# Patient Record
Sex: Female | Born: 1935 | Hispanic: No | Smoking: Never smoker
Health system: Southern US, Community
[De-identification: ages and names within clinical notes are randomized; demographics above are authoritative.]

## PROBLEM LIST (undated history)

## (undated) DIAGNOSIS — I10 Essential (primary) hypertension: Secondary | ICD-10-CM

## (undated) DIAGNOSIS — Z9989 Dependence on other enabling machines and devices: Secondary | ICD-10-CM

## (undated) DIAGNOSIS — G4733 Obstructive sleep apnea (adult) (pediatric): Secondary | ICD-10-CM

## (undated) DIAGNOSIS — Z0000 Encounter for general adult medical examination without abnormal findings: Secondary | ICD-10-CM

## (undated) DIAGNOSIS — I639 Cerebral infarction, unspecified: Secondary | ICD-10-CM

## (undated) DIAGNOSIS — M545 Low back pain, unspecified: Secondary | ICD-10-CM

## (undated) DIAGNOSIS — R0602 Shortness of breath: Secondary | ICD-10-CM

## (undated) DIAGNOSIS — F0390 Unspecified dementia without behavioral disturbance: Secondary | ICD-10-CM

## (undated) DIAGNOSIS — Z862 Personal history of diseases of the blood and blood-forming organs and certain disorders involving the immune mechanism: Secondary | ICD-10-CM

## (undated) DIAGNOSIS — K529 Noninfective gastroenteritis and colitis, unspecified: Secondary | ICD-10-CM

## (undated) DIAGNOSIS — M170 Bilateral primary osteoarthritis of knee: Secondary | ICD-10-CM

## (undated) DIAGNOSIS — R7303 Prediabetes: Secondary | ICD-10-CM

## (undated) DIAGNOSIS — K219 Gastro-esophageal reflux disease without esophagitis: Secondary | ICD-10-CM

## (undated) DIAGNOSIS — E785 Hyperlipidemia, unspecified: Secondary | ICD-10-CM

## (undated) DIAGNOSIS — E663 Overweight: Secondary | ICD-10-CM

## (undated) HISTORY — DX: Bilateral primary osteoarthritis of knee: M17.0

## (undated) HISTORY — DX: Shortness of breath: R06.02

## (undated) HISTORY — DX: Gastro-esophageal reflux disease without esophagitis: K21.9

## (undated) HISTORY — DX: Personal history of diseases of the blood and blood-forming organs and certain disorders involving the immune mechanism: Z86.2

## (undated) HISTORY — PX: LUMBAR DISC SURGERY: SHX700

## (undated) HISTORY — DX: Obstructive sleep apnea (adult) (pediatric): G47.33

## (undated) HISTORY — DX: Prediabetes: R73.03

## (undated) HISTORY — PX: CATARACT EXTRACTION: SUR2

## (undated) HISTORY — DX: Overweight: E66.3

## (undated) HISTORY — DX: Low back pain, unspecified: M54.50

## (undated) HISTORY — PX: CHOLECYSTECTOMY: SHX55

## (undated) HISTORY — DX: Dependence on other enabling machines and devices: Z99.89

## (undated) HISTORY — DX: Low back pain: M54.5

## (undated) HISTORY — DX: Noninfective gastroenteritis and colitis, unspecified: K52.9

## (undated) HISTORY — DX: Essential (primary) hypertension: I10

## (undated) HISTORY — DX: Cerebral infarction, unspecified: I63.9

## (undated) HISTORY — PX: ABDOMINAL HYSTERECTOMY: SHX81

## (undated) HISTORY — DX: Encounter for general adult medical examination without abnormal findings: Z00.00

## (undated) HISTORY — DX: Hyperlipidemia, unspecified: E78.5

---

## 2004-10-17 ENCOUNTER — Emergency Department: Payer: Self-pay | Admitting: Internal Medicine

## 2006-05-23 DIAGNOSIS — M1990 Unspecified osteoarthritis, unspecified site: Secondary | ICD-10-CM | POA: Insufficient documentation

## 2007-12-26 ENCOUNTER — Ambulatory Visit: Payer: Self-pay | Admitting: Ophthalmology

## 2008-01-21 ENCOUNTER — Ambulatory Visit: Payer: Self-pay | Admitting: Ophthalmology

## 2009-04-07 ENCOUNTER — Ambulatory Visit: Payer: Self-pay | Admitting: Ophthalmology

## 2009-04-07 ENCOUNTER — Ambulatory Visit: Payer: Self-pay | Admitting: Cardiovascular Disease

## 2009-04-20 ENCOUNTER — Ambulatory Visit: Payer: Self-pay | Admitting: Ophthalmology

## 2010-07-14 ENCOUNTER — Ambulatory Visit: Payer: Self-pay | Admitting: Internal Medicine

## 2011-04-13 ENCOUNTER — Emergency Department: Payer: Self-pay | Admitting: Unknown Physician Specialty

## 2011-04-13 LAB — COMPREHENSIVE METABOLIC PANEL
Albumin: 4 g/dL (ref 3.4–5.0)
Alkaline Phosphatase: 51 U/L (ref 50–136)
BUN: 14 mg/dL (ref 7–18)
Calcium, Total: 9.2 mg/dL (ref 8.5–10.1)
Co2: 29 mmol/L (ref 21–32)
EGFR (Non-African Amer.): >60
Glucose: 111 mg/dL — ABNORMAL HIGH (ref 65–99)
Osmolality: 282 (ref 275–301)
Potassium: 3.4 mmol/L — ABNORMAL LOW (ref 3.5–5.1)
SGOT(AST): 24 U/L (ref 15–37)
SGPT (ALT): 20 U/L
Sodium: 141 mmol/L (ref 136–145)
Total Protein: 7.8 g/dL (ref 6.4–8.2)

## 2011-04-13 LAB — CBC
MCHC: 32.3 g/dL (ref 32.0–36.0)
Platelet: 165 10*3/uL (ref 150–440)
WBC: 5.1 10*3/uL (ref 3.6–11.0)

## 2013-07-10 DIAGNOSIS — I639 Cerebral infarction, unspecified: Secondary | ICD-10-CM | POA: Insufficient documentation

## 2013-07-10 DIAGNOSIS — I2089 Other forms of angina pectoris: Secondary | ICD-10-CM | POA: Insufficient documentation

## 2013-07-10 DIAGNOSIS — E663 Overweight: Secondary | ICD-10-CM

## 2013-07-10 DIAGNOSIS — E669 Obesity, unspecified: Secondary | ICD-10-CM | POA: Insufficient documentation

## 2013-07-10 DIAGNOSIS — I209 Angina pectoris, unspecified: Secondary | ICD-10-CM | POA: Insufficient documentation

## 2013-07-10 DIAGNOSIS — I10 Essential (primary) hypertension: Secondary | ICD-10-CM | POA: Insufficient documentation

## 2013-07-10 DIAGNOSIS — R0609 Other forms of dyspnea: Secondary | ICD-10-CM | POA: Insufficient documentation

## 2013-07-10 DIAGNOSIS — I208 Other forms of angina pectoris: Secondary | ICD-10-CM | POA: Insufficient documentation

## 2013-07-10 DIAGNOSIS — E66811 Obesity, class 1: Secondary | ICD-10-CM | POA: Insufficient documentation

## 2013-07-10 DIAGNOSIS — R0602 Shortness of breath: Secondary | ICD-10-CM

## 2013-07-10 DIAGNOSIS — R0600 Dyspnea, unspecified: Secondary | ICD-10-CM | POA: Insufficient documentation

## 2013-07-10 DIAGNOSIS — E785 Hyperlipidemia, unspecified: Secondary | ICD-10-CM | POA: Insufficient documentation

## 2013-07-10 HISTORY — DX: Overweight: E66.3

## 2013-07-10 HISTORY — DX: Shortness of breath: R06.02

## 2014-04-25 DIAGNOSIS — M1990 Unspecified osteoarthritis, unspecified site: Secondary | ICD-10-CM | POA: Diagnosis not present

## 2014-07-16 ENCOUNTER — Other Ambulatory Visit: Payer: Self-pay | Admitting: Family Medicine

## 2014-07-24 ENCOUNTER — Telehealth: Payer: Self-pay | Admitting: Family Medicine

## 2014-07-24 NOTE — Telephone Encounter (Signed)
PT NEEDS HER PAIN MEDICATION REFILLED AND SHE DOES NOT REMEMBER THE NAME. SHE DOES HAVE AN APPT IN July FOR FOLLOW UP.

## 2014-07-28 ENCOUNTER — Other Ambulatory Visit: Payer: Self-pay

## 2014-07-28 ENCOUNTER — Ambulatory Visit (INDEPENDENT_AMBULATORY_CARE_PROVIDER_SITE_OTHER): Payer: Medicare Other | Admitting: Family Medicine

## 2014-07-28 ENCOUNTER — Encounter: Payer: Self-pay | Admitting: Family Medicine

## 2014-07-28 VITALS — BP 128/68 | HR 88 | Temp 98.0°F | Resp 16 | Wt 187.2 lb

## 2014-07-28 DIAGNOSIS — G8929 Other chronic pain: Secondary | ICD-10-CM | POA: Diagnosis not present

## 2014-07-28 DIAGNOSIS — R600 Localized edema: Secondary | ICD-10-CM | POA: Insufficient documentation

## 2014-07-28 DIAGNOSIS — R9431 Abnormal electrocardiogram [ECG] [EKG]: Secondary | ICD-10-CM | POA: Insufficient documentation

## 2014-07-28 DIAGNOSIS — M5136 Other intervertebral disc degeneration, lumbar region: Secondary | ICD-10-CM | POA: Insufficient documentation

## 2014-07-28 DIAGNOSIS — I10 Essential (primary) hypertension: Secondary | ICD-10-CM | POA: Insufficient documentation

## 2014-07-28 DIAGNOSIS — G510 Bell's palsy: Secondary | ICD-10-CM | POA: Insufficient documentation

## 2014-07-28 DIAGNOSIS — R0989 Other specified symptoms and signs involving the circulatory and respiratory systems: Secondary | ICD-10-CM | POA: Insufficient documentation

## 2014-07-28 HISTORY — DX: Essential (primary) hypertension: I10

## 2014-07-28 MED ORDER — HYDROCODONE-ACETAMINOPHEN 5-325 MG PO TABS: 1.0000 | ORAL_TABLET | Freq: Three times a day (TID) | ORAL | Status: DC | PRN

## 2014-07-28 MED ORDER — OMEPRAZOLE 20 MG PO CPDR: 20.0000 mg | DELAYED_RELEASE_CAPSULE | Freq: Every day | ORAL | Status: DC

## 2014-07-28 MED ORDER — PRAVASTATIN SODIUM 40 MG PO TABS: 40.0000 mg | ORAL_TABLET | Freq: Every day | ORAL | Status: DC

## 2014-07-28 NOTE — Progress Notes (Signed)
Name: Jocelyn [name]   MRN: [id number]    DOB: [birth year]   Date:07/28/2014       Progress Note  Subjective  Chief Complaint  Chief Complaint  Patient presents with  . Pain    HPI  Chronic pain  Subjective patient has chronic arthritic pain in both knees. She is currently taking sodium naproxen 500 mg twice a day and is also on Vicodin 325 one 3 times a day for pain control this is making her pain tolerable.  Past Medical History  Diagnosis Date  . Hyperlipidemia   . Hypertension   . GERD (gastroesophageal reflux disease)   . Lumbar pain     History  Substance Use Topics  . Smoking status: Never Smoker   . Smokeless tobacco: Not on file  . Alcohol Use: No     Current outpatient prescriptions:  .  amLODipine (NORVASC) 10 MG tablet, Take 10 mg by mouth daily., Disp: , Rfl:  .  cloNIDine (CATAPRES) 0.1 MG tablet, Take 0.1 mg by mouth 2 (two) times daily., Disp: , Rfl:  .  clotrimazole-betamethasone (LOTRISONE) cream, Apply 1 application topically 2 (two) times daily., Disp: , Rfl:  .  hydrochlorothiazide (HYDRODIURIL) 25 MG tablet, Take 25 mg by mouth daily., Disp: , Rfl:  .  HYDROcodone-acetaminophen (NORCO/VICODIN) 5-325 MG per tablet, Take 1 tablet by mouth 3 (three) times daily as needed., Disp: , Rfl: 0 .  isosorbide mononitrate (IMDUR) 120 MG 24 hr tablet, TAKE 1 TABLET BY MOUTH DAILY, Disp: 90 tablet, Rfl: 1 .  naproxen (NAPROSYN) 500 MG tablet, Take 500 mg by mouth 2 (two) times daily with a meal., Disp: , Rfl:  .  olmesartan (BENICAR) 40 MG tablet, Take 40 mg by mouth daily., Disp: , Rfl:  .  omeprazole (PRILOSEC) 20 MG capsule, Take 20 mg by mouth daily., Disp: , Rfl:  .  pravastatin (PRAVACHOL) 40 MG tablet, Take 40 mg by mouth daily., Disp: , Rfl:  .  TRIBENZOR 40-10-25 MG TABS, Take 1 tablet by mouth daily., Disp: , Rfl: 1  No Known Allergies  Review of Systems  Constitutional: Negative.   Musculoskeletal: Positive for joint pain.      Objective  Filed Vitals:   07/28/14 1108  BP: 128/68  Pulse: 88  Temp: 98 F (36.7 C)  Resp: 16  Weight: 187 lb 4 oz (84.936 kg)  SpO2: 93%     Physical Exam  Musculoskeletal: She exhibits tenderness.  Bilateral arthritic changes in the knees with hypertrophy. There is also some gait limitation secondary to the pain.      Assessment & Plan 1. Chronic pain Stable - HYDROcodone-acetaminophen (NORCO/VICODIN) 5-325 MG per tablet; Take 1 tablet by mouth 3 (three) times daily as needed.  Dispense: 90 tablet; Refill: 0

## 2014-07-28 NOTE — Patient Instructions (Addendum)
F/U in 2 moObesity Obesity is defined as having too much total body fat and a body mass index (BMI) of 30 or more. BMI is an estimate of body fat and is calculated from your height and weight. Obesity happens when you consume more calories than you can burn by exercising or performing daily physical tasks. Prolonged obesity can cause major illnesses or emergencies, such as:   Stroke.  Heart disease.  Diabetes.  Cancer.  Arthritis.  High blood pressure (hypertension).  High cholesterol.  Sleep apnea.  Erectile dysfunction.  Infertility problems. CAUSES   Regularly eating unhealthy foods.  Physical inactivity.  Certain disorders, such as an underactive thyroid (hypothyroidism), Cushing's syndrome, and polycystic ovarian syndrome.  Certain medicines, such as steroids, some depression medicines, and antipsychotics.  Genetics.  Lack of sleep. DIAGNOSIS  A health care provider can diagnose obesity after calculating your BMI. Obesity will be diagnosed if your BMI is 30 or higher.  There are other methods of measuring obesity levels. Some other methods include measuring your skinfold thickness, your waist circumference, and comparing your hip circumference to your waist circumference. TREATMENT  A healthy treatment program includes some or all of the following:  Long-term dietary changes.  Exercise and physical activity.  Behavioral and lifestyle changes.  Medicine only under the supervision of your health care provider. Medicines may help, but only if they are used with diet and exercise programs. An unhealthy treatment program includes:  Fasting.  Fad diets.  Supplements and drugs. These choices do not succeed in long-term weight control.  HOME CARE INSTRUCTIONS   Exercise and perform physical activity as directed by your health care provider. To increase physical activity, try the following:  Use stairs instead of elevators.  Park farther away from store  entrances.  Garden, bike, or walk instead of watching television or using the computer.  Eat healthy, low-calorie foods and drinks on a regular basis. Eat more fruits and vegetables. Use low-calorie cookbooks or take healthy cooking classes.  Limit fast food, sweets, and processed snack foods.  Eat smaller portions.  Keep a daily journal of everything you eat. There are many free websites to help you with this. It may be helpful to measure your foods so you can determine if you are eating the correct portion sizes.  Avoid drinking alcohol. Drink more water and drinks without calories.  Take vitamins and supplements only as recommended by your health care provider.  Weight-loss support groups, Tax adviser, counselors, and stress reduction education can also be very helpful. SEEK IMMEDIATE MEDICAL CARE IF:  You have chest pain or tightness.  You have trouble breathing or feel short of breath.  You have weakness or leg numbness.  You feel confused or have trouble talking.  You have sudden changes in your vision. MAKE SURE YOU:  Understand these instructions.  Will watch your condition.  Will get help right away if you are not doing well or get worse. Document Released: 02/25/2004 Document Revised: 06/03/2013 Document Reviewed: 02/23/2011 [facility] Patient Information 2015 [place], [place]. This information is not intended to replace advice given to you by your health care provider. Make sure you discuss any questions you have with your health care provider.

## 2014-08-11 ENCOUNTER — Encounter: Payer: Self-pay | Admitting: Family Medicine

## 2014-08-11 ENCOUNTER — Ambulatory Visit (INDEPENDENT_AMBULATORY_CARE_PROVIDER_SITE_OTHER): Payer: Medicare Other | Admitting: Family Medicine

## 2014-08-11 VITALS — BP 128/64 | HR 90 | Temp 98.2°F | Resp 16 | Ht 65.0 in | Wt 187.2 lb

## 2014-08-11 DIAGNOSIS — R609 Edema, unspecified: Secondary | ICD-10-CM

## 2014-08-11 DIAGNOSIS — R0989 Other specified symptoms and signs involving the circulatory and respiratory systems: Secondary | ICD-10-CM | POA: Diagnosis not present

## 2014-08-11 DIAGNOSIS — K219 Gastro-esophageal reflux disease without esophagitis: Secondary | ICD-10-CM | POA: Diagnosis not present

## 2014-08-11 DIAGNOSIS — R739 Hyperglycemia, unspecified: Secondary | ICD-10-CM

## 2014-08-11 DIAGNOSIS — E785 Hyperlipidemia, unspecified: Secondary | ICD-10-CM

## 2014-08-11 DIAGNOSIS — M5136 Other intervertebral disc degeneration, lumbar region: Secondary | ICD-10-CM | POA: Diagnosis not present

## 2014-08-11 DIAGNOSIS — I639 Cerebral infarction, unspecified: Secondary | ICD-10-CM | POA: Diagnosis not present

## 2014-08-11 DIAGNOSIS — M51369 Other intervertebral disc degeneration, lumbar region without mention of lumbar back pain or lower extremity pain: Secondary | ICD-10-CM

## 2014-08-11 DIAGNOSIS — R600 Localized edema: Secondary | ICD-10-CM

## 2014-08-11 DIAGNOSIS — M153 Secondary multiple arthritis: Secondary | ICD-10-CM | POA: Diagnosis not present

## 2014-08-11 DIAGNOSIS — H9193 Unspecified hearing loss, bilateral: Secondary | ICD-10-CM | POA: Diagnosis not present

## 2014-08-11 DIAGNOSIS — I110 Hypertensive heart disease with heart failure: Secondary | ICD-10-CM

## 2014-08-11 DIAGNOSIS — E669 Obesity, unspecified: Secondary | ICD-10-CM

## 2014-08-11 LAB — POCT GLYCOSYLATED HEMOGLOBIN (HGB A1C): Hemoglobin A1C: 6.3

## 2014-08-11 LAB — GLUCOSE, POCT (MANUAL RESULT ENTRY): POC GLUCOSE: 108 mg/dL — AB (ref 70–99)

## 2014-08-11 MED ORDER — OMEPRAZOLE 20 MG PO CPDR: 20.0000 mg | DELAYED_RELEASE_CAPSULE | Freq: Every day | ORAL | Status: DC

## 2014-08-11 MED ORDER — DICLOFENAC SODIUM 1 % TD GEL: 2.0000 g | Freq: Four times a day (QID) | TRANSDERMAL | Status: DC

## 2014-08-11 NOTE — Progress Notes (Signed)
Name: Jocelyn [name]   MRN: [id number]    DOB: 07/26/1931   Date:08/11/2014       Progress Note  Subjective  Chief Complaint  Chief Complaint  Patient presents with  . Hypertension  . Hyperlipidemia  . Gastrophageal Reflux    Hypertension This is a chronic problem. The current episode started more than 1 year ago. The problem is unchanged. The problem is controlled. Associated symptoms include anxiety and peripheral edema. Pertinent negatives include no blurred vision, chest pain, headaches, neck pain, orthopnea, palpitations or shortness of breath. There are no associated agents to hypertension. Risk factors for coronary artery disease include dyslipidemia, obesity, post-menopausal state and sedentary lifestyle. Past treatments include calcium channel blockers, central alpha agonists, diuretics and angiotensin blockers. Hypertensive end-organ damage includes CAD/MI.  Hyperlipidemia This is a chronic problem. The current episode started more than 1 year ago. The problem is controlled. Recent lipid tests were reviewed and are normal. Exacerbating diseases include obesity. Factors aggravating her hyperlipidemia include thiazides and fatty foods. Pertinent negatives include no chest pain, focal weakness, myalgias or shortness of breath. Current antihyperlipidemic treatment includes statins. The current treatment provides moderate improvement of lipids. There are no compliance problems.   Gastrophageal Reflux She complains of belching and heartburn. She reports no chest pain, no coughing, no nausea or no sore throat. This is a chronic problem. The current episode started more than 1 year ago. The problem occurs constantly. The problem has been unchanged. The symptoms are aggravated by certain foods. Pertinent negatives include no weight loss. Risk factors include caffeine use and obesity. She has tried a PPI for the symptoms. The treatment provided mild relief.    Patient has a history of elevated  fasting glucose of 105 on 7:15.i  Past Medical History  Diagnosis Date  . Hyperlipidemia   . Hypertension   . GERD (gastroesophageal reflux disease)   . Lumbar pain     History  Substance Use Topics  . Smoking status: Never Smoker   . Smokeless tobacco: Not on file  . Alcohol Use: No     Current outpatient prescriptions:  .  amLODipine (NORVASC) 10 MG tablet, Take 10 mg by mouth daily., Disp: , Rfl:  .  cloNIDine (CATAPRES) 0.1 MG tablet, Take 0.1 mg by mouth 2 (two) times daily., Disp: , Rfl:  .  clotrimazole-betamethasone (LOTRISONE) cream, Apply 1 application topically 2 (two) times daily., Disp: , Rfl:  .  hydrochlorothiazide (HYDRODIURIL) 25 MG tablet, Take 25 mg by mouth daily., Disp: , Rfl:  .  HYDROcodone-acetaminophen (NORCO/VICODIN) 5-325 MG per tablet, Take 1 tablet by mouth 3 (three) times daily as needed., Disp: 90 tablet, Rfl: 0 .  isosorbide mononitrate (IMDUR) 120 MG 24 hr tablet, TAKE 1 TABLET BY MOUTH DAILY, Disp: 90 tablet, Rfl: 1 .  naproxen (NAPROSYN) 500 MG tablet, Take 500 mg by mouth 2 (two) times daily with a meal., Disp: , Rfl:  .  olmesartan (BENICAR) 40 MG tablet, Take 40 mg by mouth daily., Disp: , Rfl:  .  omeprazole (PRILOSEC) 20 MG capsule, Take 1 capsule (20 mg total) by mouth daily., Disp: 90 capsule, Rfl: 3 .  pravastatin (PRAVACHOL) 40 MG tablet, Take 1 tablet (40 mg total) by mouth daily., Disp: 30 tablet, Rfl: 3 .  TRIBENZOR 40-10-25 MG TABS, Take 1 tablet by mouth daily., Disp: , Rfl: 1  No Known Allergies  Review of Systems  Constitutional: Negative for fever, chills and weight loss.  HENT: Negative for congestion,  hearing loss, sore throat and tinnitus.   Eyes: Negative for blurred vision, double vision and redness.  Respiratory: Negative for cough, hemoptysis and shortness of breath.   Cardiovascular: Positive for leg swelling. Negative for chest pain, palpitations, orthopnea and claudication.  Gastrointestinal: Positive for heartburn.  Negative for nausea, vomiting, diarrhea, constipation and blood in stool.  Genitourinary: Negative for dysuria, urgency, frequency and hematuria.  Musculoskeletal: Positive for back pain and joint pain. Negative for myalgias, falls and neck pain.  Skin: Negative for itching.  Neurological: Negative for dizziness, tingling, tremors, focal weakness, seizures, loss of consciousness, weakness and headaches.  Endo/Heme/Allergies: Does not bruise/bleed easily.  Psychiatric/Behavioral: Negative for depression and substance abuse. The patient is not nervous/anxious and does not have insomnia.      Objective  Filed Vitals:   08/11/14 0849  BP: 128/64  Pulse: 90  Temp: 98.2 F (36.8 C)  TempSrc: Oral  Resp: 16  Height: 5\' 5"  (1.651 m)  Weight: 187 lb 3.2 oz (84.913 kg)  SpO2: 94%     Physical Exam  Constitutional: She is oriented to person, place, and time and well-developed, well-nourished, and in no distress.  Morbid obesity  HENT:  Head: Normocephalic.  Eyes: EOM are normal. Pupils are equal, round, and reactive to light.  Neck: Normal range of motion. No thyromegaly present.  Cardiovascular: Normal rate, regular rhythm and normal heart sounds.   No murmur heard. Pulmonary/Chest: Effort normal and breath sounds normal.  Abdominal: Soft. Bowel sounds are normal.  Musculoskeletal: Normal range of motion. She exhibits edema and tenderness.  Neurological: She is alert and oriented to person, place, and time. No cranial nerve deficit. Gait normal.  Skin: Skin is warm and dry. No rash noted.  Psychiatric: Memory and affect normal.      Assessment & Plan  1. Gastroesophageal reflux disease without esophagitis Stable on omeprazole - omeprazole (PRILOSEC) 20 MG capsule; Take 1 capsule (20 mg total) by mouth daily.  Dispense: 90 capsule; Refill: 3  2. Cerebral vascular accident Remote  3. Benign hypertensive heart disease, with heart failure Well-controlled  4. Hearing loss,  bilateral Will need hearing aids at some point  5. DDD (degenerative disc disease), lumbar Continue naproxen   6. Edema extremities Continue diuretic  7. HLD (hyperlipidemia) Labs today - Comprehensive metabolic panel - Lipid panel - TSH    8. Adiposity Educational sheet on obesity  9. Secondary osteoarthritis of multiple sites At topical Voltaren gel as tolerated and needed - diclofenac sodium (VOLTAREN) 1 % GEL; Apply 2 g topically 4 (four) times daily.  Dispense: 4 Tube; Refill: 5  10 Hyperglycemia Obtaining labs - POCT Glucose (CBG) - POCT HgB A1C

## 2014-08-11 NOTE — Patient Instructions (Signed)
Diabetes Mellitus and Food It is important for you to manage your blood sugar (glucose) level. Your blood glucose level can be greatly affected by what you eat. Eating healthier foods in the appropriate amounts throughout the day at about the same time each day will help you control your blood glucose level. It can also help slow or prevent worsening of your diabetes mellitus. Healthy eating may even help you improve the level of your blood pressure and reach or maintain a healthy weight.  HOW CAN FOOD AFFECT ME? Carbohydrates Carbohydrates affect your blood glucose level more than any other type of food. Your dietitian will help you determine how many carbohydrates to eat at each meal and teach you how to count carbohydrates. Counting carbohydrates is important to keep your blood glucose at a healthy level, especially if you are using insulin or taking certain medicines for diabetes mellitus. Alcohol Alcohol can cause sudden decreases in blood glucose (hypoglycemia), especially if you use insulin or take certain medicines for diabetes mellitus. Hypoglycemia can be a life-threatening condition. Symptoms of hypoglycemia (sleepiness, dizziness, and disorientation) are similar to symptoms of having too much alcohol.  If your health care provider has given you approval to drink alcohol, do so in moderation and use the following guidelines:  Women should not have more than one drink per day, and men should not have more than two drinks per day. One drink is equal to:  12 oz of beer.  5 oz of wine.  1 oz of hard liquor.  Do not drink on an empty stomach.  Keep yourself hydrated. Have water, diet soda, or unsweetened iced tea.  Regular soda, juice, and other mixers might contain a lot of carbohydrates and should be counted. WHAT FOODS ARE NOT RECOMMENDED? As you make food choices, it is important to remember that all foods are not the same. Some foods have fewer nutrients per serving than other  foods, even though they might have the same number of calories or carbohydrates. It is difficult to get your body what it needs when you eat foods with fewer nutrients. Examples of foods that you should avoid that are high in calories and carbohydrates but low in nutrients include:  Trans fats (most processed foods list trans fats on the Nutrition Facts label).  Regular soda.  Juice.  Candy.  Sweets, such as cake, pie, doughnuts, and cookies.  Fried foods. WHAT FOODS CAN I EAT? Have nutrient-rich foods, which will nourish your body and keep you healthy. The food you should eat also will depend on several factors, including:  The calories you need.  The medicines you take.  Your weight.  Your blood glucose level.  Your blood pressure level.  Your cholesterol level. You also should eat a variety of foods, including:  Protein, such as meat, poultry, fish, tofu, nuts, and seeds (lean animal proteins are best).  Fruits.  Vegetables.  Dairy products, such as milk, cheese, and yogurt (low fat is best).  Breads, grains, pasta, cereal, rice, and beans.  Fats such as olive oil, trans fat-free margarine, canola oil, avocado, and olives. DOES EVERYONE WITH DIABETES MELLITUS HAVE THE SAME MEAL PLAN? Because every person with diabetes mellitus is different, there is not one meal plan that works for everyone. It is very important that you meet with a dietitian who will help you create a meal plan that is just right for you. Document Released: 10/14/2004 Document Revised: 01/22/2013 Document Reviewed: 12/14/2012 [employer] Patient Information 2015 [employer]. This   information is not intended to replace advice given to you by your health care provider. Make sure you discuss any questions you have with your health care provider.  

## 2014-08-19 ENCOUNTER — Ambulatory Visit (INDEPENDENT_AMBULATORY_CARE_PROVIDER_SITE_OTHER): Payer: Medicare Other | Admitting: Family Medicine

## 2014-08-19 ENCOUNTER — Ambulatory Visit
Admission: RE | Admit: 2014-08-19 | Discharge: 2014-08-19 | Disposition: A | Discharge status: Home / Self Care | Payer: Medicare Other | Source: Ambulatory Visit | Attending: Family Medicine | Admitting: Family Medicine

## 2014-08-19 ENCOUNTER — Encounter: Payer: Self-pay | Admitting: Family Medicine

## 2014-08-19 VITALS — BP 138/70 | HR 82 | Temp 98.7°F | Resp 18 | Ht 65.0 in | Wt 185.8 lb

## 2014-08-19 DIAGNOSIS — M5136 Other intervertebral disc degeneration, lumbar region: Secondary | ICD-10-CM

## 2014-08-19 DIAGNOSIS — G8929 Other chronic pain: Secondary | ICD-10-CM | POA: Diagnosis not present

## 2014-08-19 DIAGNOSIS — M4186 Other forms of scoliosis, lumbar region: Secondary | ICD-10-CM | POA: Insufficient documentation

## 2014-08-19 MED ORDER — HYDROCODONE-ACETAMINOPHEN 10-325 MG PO TABS: 1.0000 | ORAL_TABLET | Freq: Four times a day (QID) | ORAL | Status: DC | PRN

## 2014-08-19 MED ORDER — TIZANIDINE HCL 2 MG PO TABS: 2.0000 mg | ORAL_TABLET | Freq: Every evening | ORAL | Status: DC | PRN

## 2014-08-19 NOTE — Patient Instructions (Signed)

## 2014-08-19 NOTE — Progress Notes (Signed)
Name: Jocelyn [name]   MRN: [id number]    DOB: 10/26/1931   Date:08/19/2014       Progress Note  Subjective  Chief Complaint  Chief Complaint  Patient presents with  . Back Pain    patient stated that it started on Sunday morning when she got up.    HPI  Joint/Muscle Pain: Patient complains of acute on chronic lumbar back pain for which has been present for chronic issue for several years with recent 3 days of worsening pain in the lower lumbar region. Pain is located in lumbar spine, is described as aching, throbbing and tight band, and is constant .  Associated symptoms include: decreased range of motion and tenderness.  The patient has been using her usual pain medication Norco 5-325mg  every 6hrs but the pain is hardly relieved like it used to be before.  Related to injury:   No.  She denies any falls, change in activity, direct trauma. She denies fevers, worsening or new numbness or loss of function of limbs.    Patient Active Problem List   Diagnosis Date Noted  . Benign hypertensive heart disease 07/28/2014  . DDD (degenerative disc disease), lumbar 07/28/2014  . Edema extremities 07/28/2014  . Abnormal electrocardiogram 07/28/2014  . Decreased cardiac output 07/28/2014  . 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 Angina pectoris 07/10/2013  . Cerebral vascular accident 07/10/2013  . HLD (hyperlipidemia) 07/10/2013  . Adiposity 07/10/2013  . Hearing loss 01/29/2009  . Arthritis sicca 05/23/2006    History  Substance Use Topics  . Smoking status: Never Smoker   . Smokeless tobacco: Not on file  . Alcohol Use: No     Current outpatient prescriptions:  .  amLODipine (NORVASC) 10 MG tablet, Take 10 mg by mouth daily., Disp: , Rfl:  .  cloNIDine (CATAPRES) 0.1 MG tablet, Take 0.1 mg by mouth 2 (two) times daily., Disp: , Rfl:  .  clotrimazole-betamethasone (LOTRISONE) cream, Apply 1 application topically 2 (two) times daily., Disp: , Rfl:  .  hydrochlorothiazide (HYDRODIURIL) 25  MG tablet, Take 25 mg by mouth daily., Disp: , Rfl:  .  HYDROcodone-acetaminophen (NORCO/VICODIN) 5-325 MG per tablet, Take 1 tablet by mouth 3 (three) times daily as needed., Disp: 90 tablet, Rfl: 0 .  isosorbide mononitrate (IMDUR) 120 MG 24 hr tablet, TAKE 1 TABLET BY MOUTH DAILY, Disp: 90 tablet, Rfl: 1 .  naproxen (NAPROSYN) 500 MG tablet, Take 500 mg by mouth 2 (two) times daily with a meal., Disp: , Rfl:  .  olmesartan (BENICAR) 40 MG tablet, Take 40 mg by mouth daily., Disp: , Rfl:  .  omeprazole (PRILOSEC) 20 MG capsule, Take 1 capsule (20 mg total) by mouth daily., Disp: 90 capsule, Rfl: 3 .  pravastatin (PRAVACHOL) 40 MG tablet, Take 1 tablet (40 mg total) by mouth daily., Disp: 30 tablet, Rfl: 3 .  TRIBENZOR 40-10-25 MG TABS, Take 1 tablet by mouth daily., Disp: , Rfl: 1 .  diclofenac sodium (VOLTAREN) 1 % GEL, Apply 2 g topically 4 (four) times daily. (Patient not taking: Reported on 08/19/2014), Disp: 4 Tube, Rfl: 5  Past Surgical History  Procedure Laterality Date  . Cataract extraction    . Abdominal hysterectomy    . Cholecystectomy    . Lumbar disc surgery      Family History  Problem Relation Age of Onset  . Hypertension Daughter   . Diabetes Daughter     No Known Allergies   Review of Systems  CONSTITUTIONAL:  No significant weight changes, fever, chills, weakness or fatigue.  SKIN: No rash or itching.  CARDIOVASCULAR: No chest pain, chest pressure or chest discomfort. No palpitations or edema.  RESPIRATORY: No shortness of breath, cough or sputum.  NEUROLOGICAL: No headache, dizziness, syncope, paralysis, ataxia, numbness or tingling in the extremities. No memory changes. No change in bowel or bladder control.  MUSCULOSKELETAL: Yes joint pain. Yes muscle pain. HEMATOLOGIC: No anemia, bleeding or bruising.  LYMPHATICS: No enlarged lymph nodes.  PSYCHIATRIC: No change in mood. No change in sleep pattern.  ENDOCRINOLOGIC: No reports of sweating, cold or heat  intolerance. No polyuria or polydipsia.      Objective  BP 138/70 mmHg  Pulse 82  Temp(Src) 98.7 F (37.1 C) (Oral)  Resp 18  Ht 5\' 5"  (1.651 m)  Wt 185 lb 12.8 oz (84.278 kg)  BMI 30.92 kg/m2  SpO2 96% Body mass index is 30.92 kg/(m^2).  Physical Exam  Constitutional: Patient is elderly and overweight and well-nourished. In no distress. Walking with assistance of a cane. HEENT:  - Head: Normocephalic and atraumatic.  - Ears: Bilateral TMs gray, no erythema or effusion - Nose: Nasal mucosa moist - Mouth/Throat: Oropharynx is clear and moist. No tonsillar hypertrophy or erythema. No post nasal drainage.  - Eyes: Conjunctivae clear, EOM movements normal. PERRLA. No scleral icterus.  Neck: Normal range of motion. Neck supple. No JVD present. No thyromegaly present.  Cardiovascular: Normal rate, regular rhythm and normal heart sounds.  No murmur heard.  Pulmonary/Chest: Effort normal and breath sounds normal. No respiratory distress. Musculoskeletal: Normal range of motion bilateral UE with stiffness at lumbar and thoracic spine. Overlying skin of lumbar spine unremarkable, healed surgical scar. Tenderness to palpation of L3-L4 levels with no erythema or palpable step off or paraspinal muscle spasm. Straight leg testing limited due to body habitus.  Peripheral vascular: Bilateral LE trace edema. Neurological: CN II-XII grossly intact with no focal deficits. Alert and oriented to person, place, and time.  Skin: Skin is warm and dry. No rash noted. No erythema.  Psychiatric: Patient has a normal mood and affect. Behavior is normal in office today. Judgment and thought content normal in office today.   Recent Results (from the past 2160 hour(s))  POCT Glucose (CBG)     Status: Abnormal   Collection Time: 08/11/14 10:05 AM  Result Value Ref Range   POC Glucose 108 (A) 70 - 99 mg/dl  POCT HgB A1C     Status: None   Collection Time: 08/11/14 10:11 AM  Result Value Ref Range    Hemoglobin A1C 6.3      Assessment & Plan  1. DDD (degenerative disc disease), lumbar Will get X-ray of lumbar spine to rule out any acute changes. Recommended heating pad with active exercises at home instructed and printed out for patient. May use muscle relaxer at night time but counseled on drowsy side effects. Increased pain medication dose to Norco 10-325mg  short term only, quantity #20 prescribed. Patient was not able to pick up Voltaren gel at the pharmacy, reason unknown.   - DG Lumbar Spine Complete; Future - HYDROcodone-acetaminophen (NORCO) 10-325 MG per tablet; Take 1 tablet by mouth every 6 (six) hours as needed for severe pain.  Dispense: 20 tablet; Refill: 0 - tiZANidine (ZANAFLEX) 2 MG tablet; Take 1-2 tablets (2-4 mg total) by mouth at bedtime as needed for muscle spasms.  Dispense: 20 tablet; Refill: 0  2. Chronic pain Component of opioid prescription medication tolerance build up is  a possibility.   - HYDROcodone-acetaminophen (NORCO) 10-325 MG per tablet; Take 1 tablet by mouth every 6 (six) hours as needed for severe pain.  Dispense: 20 tablet; Refill: 0 - tiZANidine (ZANAFLEX) 2 MG tablet; Take 1-2 tablets (2-4 mg total) by mouth at bedtime as needed for muscle spasms.  Dispense: 20 tablet; Refill: 0

## 2014-09-29 ENCOUNTER — Ambulatory Visit: Payer: Medicare Other | Admitting: Family Medicine

## 2014-10-03 ENCOUNTER — Telehealth: Payer: Self-pay

## 2014-10-03 NOTE — Telephone Encounter (Signed)
Patient is needing her Vicodin refilled.

## 2014-10-08 ENCOUNTER — Ambulatory Visit (INDEPENDENT_AMBULATORY_CARE_PROVIDER_SITE_OTHER): Payer: Medicare Other | Admitting: Family Medicine

## 2014-10-08 ENCOUNTER — Encounter: Payer: Self-pay | Admitting: Family Medicine

## 2014-10-08 ENCOUNTER — Ambulatory Visit
Admission: RE | Admit: 2014-10-08 | Discharge: 2014-10-08 | Disposition: A | Discharge status: Home / Self Care | Payer: Medicare Other | Source: Ambulatory Visit | Attending: Family Medicine | Admitting: Family Medicine

## 2014-10-08 VITALS — BP 122/68 | HR 91 | Temp 98.7°F | Resp 16 | Ht 65.0 in | Wt 186.1 lb

## 2014-10-08 DIAGNOSIS — I639 Cerebral infarction, unspecified: Secondary | ICD-10-CM | POA: Diagnosis not present

## 2014-10-08 DIAGNOSIS — R609 Edema, unspecified: Secondary | ICD-10-CM | POA: Diagnosis not present

## 2014-10-08 DIAGNOSIS — I119 Hypertensive heart disease without heart failure: Secondary | ICD-10-CM | POA: Diagnosis not present

## 2014-10-08 DIAGNOSIS — M5136 Other intervertebral disc degeneration, lumbar region: Secondary | ICD-10-CM

## 2014-10-08 DIAGNOSIS — E669 Obesity, unspecified: Secondary | ICD-10-CM | POA: Diagnosis not present

## 2014-10-08 DIAGNOSIS — M19031 Primary osteoarthritis, right wrist: Secondary | ICD-10-CM | POA: Diagnosis not present

## 2014-10-08 DIAGNOSIS — G8929 Other chronic pain: Secondary | ICD-10-CM | POA: Diagnosis not present

## 2014-10-08 DIAGNOSIS — M19041 Primary osteoarthritis, right hand: Secondary | ICD-10-CM

## 2014-10-08 DIAGNOSIS — I209 Angina pectoris, unspecified: Secondary | ICD-10-CM | POA: Diagnosis not present

## 2014-10-08 DIAGNOSIS — Z23 Encounter for immunization: Secondary | ICD-10-CM

## 2014-10-08 DIAGNOSIS — H9193 Unspecified hearing loss, bilateral: Secondary | ICD-10-CM

## 2014-10-08 DIAGNOSIS — R600 Localized edema: Secondary | ICD-10-CM

## 2014-10-08 DIAGNOSIS — M51369 Other intervertebral disc degeneration, lumbar region without mention of lumbar back pain or lower extremity pain: Secondary | ICD-10-CM

## 2014-10-08 MED ORDER — HYDROCODONE-ACETAMINOPHEN 5-325 MG PO TABS: 1.0000 | ORAL_TABLET | Freq: Three times a day (TID) | ORAL | Status: DC | PRN

## 2014-10-08 NOTE — Addendum Note (Signed)
Addended by: Lolita Rieger D on: 10/08/2014 10:04 AM   Modules accepted: Orders

## 2014-10-08 NOTE — Progress Notes (Signed)
Name: Jocelyn [name]   MRN: [id number]    DOB: 12-03-1931   Date:10/08/2014       Progress Note  Subjective  Chief Complaint  Chief Complaint  Patient presents with  . Hyperlipidemia  . Hypertension  . Osteoarthritis  . Gastrophageal Reflux    HPI  Hypertension   Patient presents for follow-up of hypertension. It has been present for over 20 years.  Patient states that there is compliance with medical regimen which consists of amlodipine and clonidine hydrochlorothiazide Benicar . There is no end organ disease. Cardiac risk factors include hypertension hyperlipidemia and diabetes.  Exercise regimen consist of none .  Diet consist of noncompliance .  Hyperlipidemia  Patient has a history of hyperlipidemia for 20 years.  Current medical regimen consist of Pravachol 40 mg daily at bedtime.  Compliance is good .  Diet and exercise are currently followed rarely .  Risk factors for cardiovascular disease include hyperlipidemia, hypertension, obesity, and a sedentary lifestyle  There have been no side effects from the medication.    Obesity  Patient has a history of obesity for 20+ years.  Attempts at weight loss have included diet and exercise and rarely .  Results of this regimen  have been none .  Patient now voices and interest in weight loss by nothing .    Past Medical History  Diagnosis Date  . Hyperlipidemia   . Hypertension   . GERD (gastroesophageal reflux disease)   . Lumbar pain     Social History  Substance Use Topics  . Smoking status: Never Smoker   . Smokeless tobacco: Not on file  . Alcohol Use: No     Current outpatient prescriptions:  .  amLODipine (NORVASC) 10 MG tablet, Take 10 mg by mouth daily., Disp: , Rfl:  .  cloNIDine (CATAPRES) 0.1 MG tablet, Take 0.1 mg by mouth 2 (two) times daily., Disp: , Rfl:  .  clotrimazole-betamethasone (LOTRISONE) cream, Apply 1 application topically 2 (two) times daily., Disp: , Rfl:  .  diclofenac sodium (VOLTAREN) 1 %  GEL, Apply 2 g topically 4 (four) times daily. (Patient not taking: Reported on 08/19/2014), Disp: 4 Tube, Rfl: 5 .  hydrochlorothiazide (HYDRODIURIL) 25 MG tablet, Take 25 mg by mouth daily., Disp: , Rfl:  .  HYDROcodone-acetaminophen (NORCO) 10-325 MG per tablet, Take 1 tablet by mouth every 6 (six) hours as needed for severe pain., Disp: 20 tablet, Rfl: 0 .  HYDROcodone-acetaminophen (NORCO/VICODIN) 5-325 MG per tablet, Take 1 tablet by mouth 3 (three) times daily as needed., Disp: 90 tablet, Rfl: 0 .  isosorbide mononitrate (IMDUR) 120 MG 24 hr tablet, TAKE 1 TABLET BY MOUTH DAILY, Disp: 90 tablet, Rfl: 1 .  naproxen (NAPROSYN) 500 MG tablet, Take 500 mg by mouth 2 (two) times daily with a meal., Disp: , Rfl:  .  olmesartan (BENICAR) 40 MG tablet, Take 40 mg by mouth daily., Disp: , Rfl:  .  omeprazole (PRILOSEC) 20 MG capsule, Take 1 capsule (20 mg total) by mouth daily., Disp: 90 capsule, Rfl: 3 .  pravastatin (PRAVACHOL) 40 MG tablet, Take 1 tablet (40 mg total) by mouth daily., Disp: 30 tablet, Rfl: 3 .  tiZANidine (ZANAFLEX) 2 MG tablet, Take 1-2 tablets (2-4 mg total) by mouth at bedtime as needed for muscle spasms., Disp: 20 tablet, Rfl: 0 .  TRIBENZOR 40-10-25 MG TABS, Take 1 tablet by mouth daily., Disp: , Rfl: 1  No Known Allergies  Review of Systems  Constitutional: Negative  for fever, chills and weight loss.  HENT: Negative for congestion, hearing loss, sore throat and tinnitus.   Eyes: Negative for blurred vision, double vision and redness.  Respiratory: Negative for cough, hemoptysis and shortness of breath.   Cardiovascular: Negative for chest pain, palpitations, orthopnea, claudication and leg swelling.  Gastrointestinal: Negative for heartburn, nausea, vomiting, diarrhea, constipation and blood in stool.  Genitourinary: Negative for dysuria, urgency, frequency and hematuria.  Musculoskeletal: Positive for back pain and joint pain. Negative for myalgias, falls and neck pain.   Skin: Negative for itching.  Neurological: Negative for dizziness, tingling, tremors, focal weakness, seizures, loss of consciousness, weakness and headaches.  Endo/Heme/Allergies: Does not bruise/bleed easily.  Psychiatric/Behavioral: Negative for depression and substance abuse. The patient is not nervous/anxious and does not have insomnia.      Objective  Filed Vitals:   10/08/14 0810  BP: 122/68  Pulse: 91  Temp: 98.7 F (37.1 C)  Resp: 16  Height: 5\' 5"  (1.651 m)  Weight: 186 lb 1 oz (84.397 kg)  SpO2: 96%     Physical Exam  Constitutional: She is oriented to person, place, and time and well-developed, well-nourished, and in no distress.  Obese female who is in no acute distress  HENT:  Head: Normocephalic.  Eyes: EOM are normal. Pupils are equal, round, and reactive to light.  Neck: Normal range of motion. No thyromegaly present.  Cardiovascular: Normal rate, regular rhythm and normal heart sounds.   No murmur heard. Pulmonary/Chest: Effort normal and breath sounds normal.  Abdominal: Soft. Bowel sounds are normal.  Musculoskeletal: She exhibits no edema.  Generalized osteoarthritic changes  Neurological: She is alert and oriented to person, place, and time. No cranial nerve deficit. Gait normal.  Skin: Skin is warm and dry. No rash noted.  Psychiatric: Memory and affect normal.      Assessment & Plan  1. Benign hypertensive heart disease, without heart failure Well-controlled - Comprehensive Metabolic Panel (CMET) - Lipid Profile - TSH  2. Angina pectoris Well-controlled and followed by cardiologist  3. Cerebral vascular accident Minimal residual  4. Hearing loss, bilateral Stable  5. DDD (degenerative disc disease), lumbar Worsening  6. Adiposity Not improved stable stable  7. Edema extremities Stable  8. Chronic pain Renew meds B x-ray showing - HYDROcodone-acetaminophen (NORCO/VICODIN) 5-325 MG per tablet; Take 1 tablet by mouth 3  (three) times daily as needed.  Dispense: 90 tablet; Refill: 0 - HYDROcodone-acetaminophen (NORCO/VICODIN) 5-325 MG per tablet; Take 1 tablet by mouth 3 (three) times daily as needed.  Dispense: 90 tablet; Refill: 0 - HYDROcodone-acetaminophen (NORCO/VICODIN) 5-325 MG per tablet; Take 1 tablet by mouth 3 (three) times daily as needed.  Dispense: 90 tablet; Refill: 0  9. Need for influenza vaccination Given today - Flu vaccine HIGH DOSE PF (Fluzone High dose)  10. Osteoarthritis of right hand, unspecified osteoarthritis type X-rays - DG Hand Complete Right; Future - DG Wrist Complete Right; Future

## 2014-10-09 LAB — COMPREHENSIVE METABOLIC PANEL
A/G RATIO: 1.3 (ref 1.1–2.5)
ALT: 20 IU/L (ref 0–32)
AST: 22 IU/L (ref 0–40)
Albumin: 4.2 g/dL (ref 3.5–4.7)
Alkaline Phosphatase: 70 IU/L (ref 39–117)
BUN/Creatinine Ratio: 24 (ref 11–26)
BUN: 17 mg/dL (ref 8–27)
Bilirubin Total: 0.5 mg/dL (ref 0.0–1.2)
CALCIUM: 9.7 mg/dL (ref 8.7–10.3)
CO2: 29 mmol/L (ref 18–29)
CREATININE: 0.72 mg/dL (ref 0.57–1.00)
Chloride: 103 mmol/L (ref 97–108)
GFR, EST AFRICAN AMERICAN: 91 mL/min/{1.73_m2} (ref >59)
GFR, EST NON AFRICAN AMERICAN: 79 mL/min/{1.73_m2} (ref >59)
GLOBULIN, TOTAL: 3.2 g/dL (ref 1.5–4.5)
Glucose: 106 mg/dL — ABNORMAL HIGH (ref 65–99)
POTASSIUM: 4 mmol/L (ref 3.5–5.2)
SODIUM: 145 mmol/L — AB (ref 134–144)
TOTAL PROTEIN: 7.4 g/dL (ref 6.0–8.5)

## 2014-10-09 LAB — LIPID PANEL
CHOL/HDL RATIO: 2.7 ratio (ref 0.0–4.4)
Cholesterol, Total: 152 mg/dL (ref 100–199)
HDL: 56 mg/dL (ref >39)
LDL CALC: 74 mg/dL (ref 0–99)
Triglycerides: 109 mg/dL (ref 0–149)
VLDL Cholesterol Cal: 22 mg/dL (ref 5–40)

## 2014-10-09 LAB — TSH: TSH: 0.898 u[IU]/mL (ref 0.450–4.500)

## 2014-11-21 ENCOUNTER — Other Ambulatory Visit: Payer: Self-pay | Admitting: Family Medicine

## 2014-12-15 ENCOUNTER — Encounter: Payer: Self-pay | Admitting: Family Medicine

## 2014-12-15 ENCOUNTER — Ambulatory Visit (INDEPENDENT_AMBULATORY_CARE_PROVIDER_SITE_OTHER): Payer: Medicare Other | Admitting: Family Medicine

## 2014-12-15 VITALS — BP 130/78 | HR 86 | Temp 98.6°F | Resp 16 | Ht 65.0 in | Wt 182.8 lb

## 2014-12-15 DIAGNOSIS — R739 Hyperglycemia, unspecified: Secondary | ICD-10-CM

## 2014-12-15 DIAGNOSIS — R600 Localized edema: Secondary | ICD-10-CM

## 2014-12-15 DIAGNOSIS — R609 Edema, unspecified: Secondary | ICD-10-CM

## 2014-12-15 DIAGNOSIS — I509 Heart failure, unspecified: Secondary | ICD-10-CM

## 2014-12-15 DIAGNOSIS — E785 Hyperlipidemia, unspecified: Secondary | ICD-10-CM | POA: Diagnosis not present

## 2014-12-15 DIAGNOSIS — I63119 Cerebral infarction due to embolism of unspecified vertebral artery: Secondary | ICD-10-CM

## 2014-12-15 DIAGNOSIS — I110 Hypertensive heart disease with heart failure: Secondary | ICD-10-CM | POA: Diagnosis not present

## 2014-12-15 DIAGNOSIS — I209 Angina pectoris, unspecified: Secondary | ICD-10-CM

## 2014-12-15 LAB — POCT GLYCOSYLATED HEMOGLOBIN (HGB A1C): HEMOGLOBIN A1C: 6.4

## 2014-12-15 LAB — GLUCOSE, POCT (MANUAL RESULT ENTRY): POC GLUCOSE: 95 mg/dL (ref 70–99)

## 2014-12-15 MED ORDER — PRAVASTATIN SODIUM 40 MG PO TABS: 40.0000 mg | ORAL_TABLET | Freq: Every day | ORAL | Status: DC

## 2014-12-15 MED ORDER — ASPIRIN EC 81 MG PO TBEC: 81.0000 mg | DELAYED_RELEASE_TABLET | Freq: Every day | ORAL | Status: DC

## 2014-12-15 NOTE — Progress Notes (Signed)
Name: Jocelyn [name]   MRN: [id number]    DOB: [birth year]   Date:12/15/2014       Progress Note  Subjective  Chief Complaint  Chief Complaint  Patient presents with  . Hypertension    4 month follow up  . Hyperlipidemia  . Knee Pain    HPI  Hypertension   Patient presents for follow-up of hypertension. It has been present for over 5 years.  Patient states that there is compliance with medical regimen which consists of clonidine and amlodipine and Imdur . There is no end organ disease. Cardiac risk factors include hypertension hyperlipidemia and diabetes obesity and sedentary lifestyle.  Exercise regimen consist of none with regularity .  Diet consist of inappropriate at times.  Hyperlipidemia  Patient has a history of hyperlipidemia for over 5 years.  Current medical regimen consist of pravastatin 40 mg daily at bedtime .  Compliance is good .  Diet and exercise are currently followed poorly .   There have been no side effects from the medication.    Hyperglycemia  Patient's an elevated glucose to the low 100s with an A1c as high as 6.3. Currently no polyuria polydipsia polyphagia.  Osteoarthritis   complaint of continued on  pain particularly in the knees. She is currently on NSAID and Tylenol. This has been present for a number of years  Past Medical History  Diagnosis Date  . Hyperlipidemia   . Hypertension   . GERD (gastroesophageal reflux disease)   . Lumbar pain     Social History  Substance Use Topics  . Smoking status: Never Smoker   . Smokeless tobacco: Not on file  . Alcohol Use: No     Current outpatient prescriptions:  .  amLODipine (NORVASC) 10 MG tablet, Take 10 mg by mouth daily., Disp: , Rfl:  .  cloNIDine (CATAPRES) 0.1 MG tablet, Take 0.1 mg by mouth 2 (two) times daily., Disp: , Rfl:  .  clotrimazole-betamethasone (LOTRISONE) cream, Apply 1 application topically 2 (two) times daily., Disp: , Rfl:  .  diclofenac sodium (VOLTAREN) 1 % GEL, Apply  2 g topically 4 (four) times daily. (Patient not taking: Reported on 08/19/2014), Disp: 4 Tube, Rfl: 5 .  HYDROcodone-acetaminophen (NORCO) 10-325 MG per tablet, Take 1 tablet by mouth every 6 (six) hours as needed for severe pain., Disp: 20 tablet, Rfl: 0 .  HYDROcodone-acetaminophen (NORCO/VICODIN) 5-325 MG per tablet, Take 1 tablet by mouth 3 (three) times daily as needed., Disp: 90 tablet, Rfl: 0 .  HYDROcodone-acetaminophen (NORCO/VICODIN) 5-325 MG per tablet, Take 1 tablet by mouth 3 (three) times daily as needed., Disp: 90 tablet, Rfl: 0 .  HYDROcodone-acetaminophen (NORCO/VICODIN) 5-325 MG per tablet, Take 1 tablet by mouth 3 (three) times daily as needed., Disp: 90 tablet, Rfl: 0 .  hydrOXYzine (ATARAX/VISTARIL) 25 MG tablet, TAKE 1 TABLET BY MOUTH TWICE A DAY AS NEEDED, Disp: 60 tablet, Rfl: 5 .  isosorbide mononitrate (IMDUR) 120 MG 24 hr tablet, TAKE 1 TABLET BY MOUTH DAILY, Disp: 90 tablet, Rfl: 1 .  naproxen (NAPROSYN) 500 MG tablet, Take 500 mg by mouth 2 (two) times daily with a meal., Disp: , Rfl:  .  omeprazole (PRILOSEC) 20 MG capsule, Take 1 capsule (20 mg total) by mouth daily., Disp: 90 capsule, Rfl: 3 .  pravastatin (PRAVACHOL) 40 MG tablet, Take 1 tablet (40 mg total) by mouth daily., Disp: 30 tablet, Rfl: 5 .  tiZANidine (ZANAFLEX) 2 MG tablet, Take 1-2 tablets (2-4 mg total) by  mouth at bedtime as needed for muscle spasms., Disp: 20 tablet, Rfl: 0 .  TRIBENZOR 40-10-25 MG TABS, Take 1 tablet by mouth daily., Disp: , Rfl: 1  No Known Allergies  Review of Systems  Constitutional: Negative for fever, chills and weight loss.  HENT: Positive for hearing loss. Negative for congestion, sore throat and tinnitus.   Eyes: Negative for blurred vision, double vision and redness.  Respiratory: Negative for cough, hemoptysis and shortness of breath.   Cardiovascular: Negative for chest pain, palpitations, orthopnea, claudication and leg swelling.  Gastrointestinal: Negative for  heartburn, nausea, vomiting, diarrhea, constipation and blood in stool.  Genitourinary: Negative for dysuria, urgency, frequency and hematuria.  Musculoskeletal: Positive for back pain and joint pain. Negative for myalgias, falls and neck pain.  Skin: Negative for itching.  Neurological: Negative for dizziness, tingling, tremors, focal weakness, seizures, loss of consciousness, weakness and headaches.  Endo/Heme/Allergies: Does not bruise/bleed easily.  Psychiatric/Behavioral: Negative for depression and substance abuse. The patient is not nervous/anxious and does not have insomnia.      Objective  Filed Vitals:   12/15/14 0818  BP: 130/78  Pulse: 86  Temp: 98.6 F (37 C)  TempSrc: Oral  Resp: 16  Height: 5\' 5"  (1.651 m)  Weight: 182 lb 12.8 oz (82.918 kg)  SpO2: 95%     Physical Exam  Constitutional: She is oriented to person, place, and time.  Obese and in no acute distress.  HENT:  Head: Normocephalic.  Eyes: EOM are normal. Pupils are equal, round, and reactive to light.  Neck: Normal range of motion. No thyromegaly present.  Cardiovascular: Normal rate, regular rhythm and normal heart sounds.   No murmur heard. Pulmonary/Chest: Effort normal and breath sounds normal.  Abdominal: Soft. Bowel sounds are normal.  Musculoskeletal: She exhibits no edema.  Followed also 30 changes of the knees lesser extent of the hands.  Neurological: She is alert and oriented to person, place, and time. No cranial nerve deficit. Gait normal.  Skin: Skin is warm and dry. No rash noted.  Psychiatric: Memory and affect normal.      Assessment & Plan   1. Benign hypertensive heart disease, with heart failure ([place]) Well-controlled  2. Angina pectoris ([facility]) Stable  3. 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 HLD (hyperlipidemia) Continue statin  5. Edema extremities Continue  diuretic  6. Hyperglycemia Check glucose and A1c today - POCT HgB A1C - POCT Glucose (CBG)

## 2014-12-20 ENCOUNTER — Other Ambulatory Visit: Payer: Self-pay | Admitting: Family Medicine

## 2015-01-21 ENCOUNTER — Other Ambulatory Visit: Payer: Self-pay | Admitting: Family Medicine

## 2015-03-05 ENCOUNTER — Telehealth: Payer: Self-pay

## 2015-03-05 NOTE — Telephone Encounter (Signed)
Called pt to speak to her husband and while on the phone she asked if she could get a refill of her pain medication.

## 2015-03-05 NOTE — Telephone Encounter (Signed)
Last rx was written to be filled in November, needs to be seen.

## 2015-04-01 ENCOUNTER — Telehealth: Payer: Self-pay

## 2015-04-01 DIAGNOSIS — G8929 Other chronic pain: Secondary | ICD-10-CM

## 2015-04-01 MED ORDER — HYDROCODONE-ACETAMINOPHEN 5-325 MG PO TABS: 1.0000 | ORAL_TABLET | Freq: Three times a day (TID) | ORAL | Status: DC | PRN

## 2015-04-01 NOTE — Telephone Encounter (Signed)
done

## 2015-04-01 NOTE — Telephone Encounter (Signed)
Explained to pt daughter that her mother has not been seen since 12/2014 for any pain medications and that you would only write a script for 30 to cover her until her appointment

## 2015-04-14 ENCOUNTER — Ambulatory Visit: Payer: Medicare Other | Admitting: Family Medicine

## 2015-04-22 ENCOUNTER — Other Ambulatory Visit: Payer: Self-pay

## 2015-04-22 MED ORDER — PRAVASTATIN SODIUM 40 MG PO TABS: 40.0000 mg | ORAL_TABLET | Freq: Every day | ORAL | Status: DC

## 2015-04-27 ENCOUNTER — Other Ambulatory Visit: Payer: Self-pay

## 2015-04-27 MED ORDER — CLONIDINE HCL 0.1 MG PO TABS: 0.1000 mg | ORAL_TABLET | Freq: Two times a day (BID) | ORAL | Status: DC

## 2015-05-08 ENCOUNTER — Telehealth: Payer: Self-pay | Admitting: Family Medicine

## 2015-05-08 DIAGNOSIS — G8929 Other chronic pain: Secondary | ICD-10-CM

## 2015-05-08 NOTE — Telephone Encounter (Signed)
Pt is requesting refill on Hydrocodone she is completely out.

## 2015-05-11 MED ORDER — HYDROCODONE-ACETAMINOPHEN 5-325 MG PO TABS: 1.0000 | ORAL_TABLET | Freq: Three times a day (TID) | ORAL | Status: DC | PRN

## 2015-05-11 NOTE — Telephone Encounter (Signed)
Will print out script but Rutherford Nail came in on Friday and I am not sure when he will be back in office to sign off on this. And the other providers will not sign this.

## 2015-05-19 ENCOUNTER — Ambulatory Visit: Payer: Medicare Other | Admitting: Family Medicine

## 2015-05-28 ENCOUNTER — Other Ambulatory Visit: Payer: Self-pay | Admitting: Family Medicine

## 2015-06-18 ENCOUNTER — Other Ambulatory Visit: Payer: Self-pay | Admitting: Family Medicine

## 2015-06-24 ENCOUNTER — Ambulatory Visit: Payer: Medicare Other | Admitting: Family Medicine

## 2015-06-24 ENCOUNTER — Telehealth: Payer: Self-pay | Admitting: Family Medicine

## 2015-06-24 DIAGNOSIS — G8929 Other chronic pain: Secondary | ICD-10-CM

## 2015-06-24 MED ORDER — HYDROCODONE-ACETAMINOPHEN 5-325 MG PO TABS: 1.0000 | ORAL_TABLET | Freq: Three times a day (TID) | ORAL | Status: DC | PRN

## 2015-06-24 NOTE — Telephone Encounter (Signed)
Thirty pills filled on 04/01/15 and thirty pills filled on 05/14/15 Okay for refill, but she'll absolutely need to be seen for any additional Rxs We hope she had a lovely birthday yesterday

## 2015-06-24 NOTE — Telephone Encounter (Signed)
Patient had appointment for today but showed up late for her appointment. I had to reschedule for your next availablitiy which is 07-08-15. She would like to know if you could give her enough hydrocodone to last until appointment.

## 2015-06-25 NOTE — Telephone Encounter (Signed)
Patient informed and will pick up prescription today

## 2015-07-08 ENCOUNTER — Encounter: Payer: Self-pay | Admitting: Family Medicine

## 2015-07-08 ENCOUNTER — Ambulatory Visit (INDEPENDENT_AMBULATORY_CARE_PROVIDER_SITE_OTHER): Payer: Medicare Other | Admitting: Family Medicine

## 2015-07-08 ENCOUNTER — Telehealth: Payer: Self-pay

## 2015-07-08 VITALS — BP 142/78 | HR 82 | Temp 98.4°F | Resp 16 | Wt 183.5 lb

## 2015-07-08 DIAGNOSIS — E785 Hyperlipidemia, unspecified: Secondary | ICD-10-CM | POA: Diagnosis not present

## 2015-07-08 DIAGNOSIS — M5136 Other intervertebral disc degeneration, lumbar region: Secondary | ICD-10-CM

## 2015-07-08 DIAGNOSIS — Z5181 Encounter for therapeutic drug level monitoring: Secondary | ICD-10-CM

## 2015-07-08 DIAGNOSIS — G4733 Obstructive sleep apnea (adult) (pediatric): Secondary | ICD-10-CM | POA: Diagnosis not present

## 2015-07-08 DIAGNOSIS — Z79899 Other long term (current) drug therapy: Secondary | ICD-10-CM

## 2015-07-08 DIAGNOSIS — M129 Arthropathy, unspecified: Secondary | ICD-10-CM

## 2015-07-08 DIAGNOSIS — R609 Edema, unspecified: Secondary | ICD-10-CM | POA: Diagnosis not present

## 2015-07-08 DIAGNOSIS — I10 Essential (primary) hypertension: Secondary | ICD-10-CM

## 2015-07-08 DIAGNOSIS — Z9989 Dependence on other enabling machines and devices: Secondary | ICD-10-CM

## 2015-07-08 DIAGNOSIS — M170 Bilateral primary osteoarthritis of knee: Secondary | ICD-10-CM

## 2015-07-08 DIAGNOSIS — G8929 Other chronic pain: Secondary | ICD-10-CM | POA: Diagnosis not present

## 2015-07-08 DIAGNOSIS — I639 Cerebral infarction, unspecified: Secondary | ICD-10-CM | POA: Diagnosis not present

## 2015-07-08 DIAGNOSIS — I209 Angina pectoris, unspecified: Secondary | ICD-10-CM

## 2015-07-08 DIAGNOSIS — R600 Localized edema: Secondary | ICD-10-CM

## 2015-07-08 MED ORDER — HYDROCODONE-ACETAMINOPHEN 5-325 MG PO TABS: 1.0000 | ORAL_TABLET | Freq: Three times a day (TID) | ORAL | Status: DC | PRN

## 2015-07-08 NOTE — Assessment & Plan Note (Addendum)
Fair control; previous diagnosis with hypertensive heart disease with heart failure; I cannot find anything in teh chart to support heart failure, so I am changing diagnosis to I10; encouraged DASH guidelines

## 2015-07-08 NOTE — Assessment & Plan Note (Addendum)
This diagnosis is in her permanent problem list; upon review and history, patient and daughter do not think this is true; she reports passing her stress test and not suffering from any chest pain; however, I reviewed Dr. Etta Quill note from 2015 and he does report stable angina treated with Imdur, aspirin, and amlodipine

## 2015-07-08 NOTE — Assessment & Plan Note (Signed)
No edema at all today

## 2015-07-08 NOTE — Assessment & Plan Note (Addendum)
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

## 2015-07-08 NOTE — Assessment & Plan Note (Signed)

## 2015-07-08 NOTE — Patient Instructions (Addendum)
Decrease the omeprazole a little bit, skip the afternoon dose a couple days of the week, and if that works out fine, then stop the afternoon dose altogether [employer] check labs today Your goal blood pressure is less than 150 mmHg on top. Try to follow the DASH guidelines (DASH stands for Dietary Approaches to Stop Hypertension) Try to limit the sodium in your diet.  Ideally, consume less than 1.5 grams (less than 1,500mg ) per day. Do not add salt when cooking or at the table.  Check the sodium amount on labels when shopping, and choose items lower in sodium when given a choice. Avoid or limit foods that already contain a lot of sodium. Eat a diet rich in fruits and vegetables and whole grains.  Take your aspirin at least one hour BEFORE the naproxen in the morning  Try turmeric as a natural anti-inflammatory (for pain and arthritis). It comes in capsules where you buy aspirin and fish oil, but also as a spice where you buy pepper and garlic powder.    DASH Eating Plan DASH stands for "Dietary Approaches to Stop Hypertension." The DASH eating plan is a healthy eating plan that has been shown to reduce high blood pressure (hypertension). Additional health benefits may include reducing the risk of type 2 diabetes mellitus, heart disease, and stroke. The DASH eating plan may also help with weight loss. WHAT DO I NEED TO KNOW ABOUT THE DASH EATING PLAN? For the DASH eating plan, you will follow these general guidelines:  Choose foods with a percent daily value for sodium of less than 5% (as listed on the food label).  Use salt-free seasonings or herbs instead of table salt or sea salt.  Check with your health care provider or pharmacist before using salt substitutes.  Eat lower-sodium products, often labeled as "lower sodium" or "no salt added."  Eat fresh foods.  Eat more vegetables, fruits, and low-fat dairy products.  Choose whole grains. Look for the word "whole" as the first word in the  ingredient list.  Choose fish and skinless chicken or [place] more often than red meat. Limit fish, poultry, and meat to 6 oz (170 g) each day.  Limit sweets, desserts, sugars, and sugary drinks.  Choose heart-healthy fats.  Limit cheese to 1 oz (28 g) per day.  Eat more home-cooked food and less restaurant, buffet, and fast food.  Limit fried foods.  Cook foods using methods other than frying.  Limit canned vegetables. If you do use them, rinse them well to decrease the sodium.  When eating at a restaurant, ask that your food be prepared with less salt, or no salt if possible. WHAT FOODS CAN I EAT? Seek help from a dietitian for individual calorie needs. Grains Whole grain or whole wheat bread. Brown rice. Whole grain or whole wheat pasta. Quinoa, bulgur, and whole grain cereals. Low-sodium cereals. Corn or whole wheat flour tortillas. Whole grain cornbread. Whole grain crackers. Low-sodium crackers. Vegetables Fresh or frozen vegetables (raw, steamed, roasted, or grilled). Low-sodium or reduced-sodium tomato and vegetable juices. Low-sodium or reduced-sodium tomato sauce and paste. Low-sodium or reduced-sodium canned vegetables.  Fruits All fresh, canned (in natural juice), or frozen fruits. Meat and Other Protein Products Ground beef (85% or leaner), grass-fed beef, or beef trimmed of fat. Skinless chicken or [place]. Ground chicken or [place]. Pork trimmed of fat. All fish and seafood. Eggs. Dried beans, peas, or lentils. Unsalted nuts and seeds. Unsalted canned beans. Dairy Low-fat dairy products, such as skim or  1% milk, 2% or reduced-fat cheeses, low-fat ricotta or cottage cheese, or plain low-fat yogurt. Low-sodium or reduced-sodium cheeses. Fats and Oils Tub margarines without trans fats. Light or reduced-fat mayonnaise and salad dressings (reduced sodium). Avocado. Safflower, olive, or canola oils. Natural peanut or almond butter. Other Unsalted popcorn and pretzels. The  items listed above may not be a complete list of recommended foods or beverages. Contact your dietitian for more options. WHAT FOODS ARE NOT RECOMMENDED? Grains White bread. White pasta. White rice. Refined cornbread. Bagels and croissants. Crackers that contain trans fat. Vegetables Creamed or fried vegetables. Vegetables in a cheese sauce. Regular canned vegetables. Regular canned tomato sauce and paste. Regular tomato and vegetable juices. Fruits Dried fruits. Canned fruit in light or heavy syrup. Fruit juice. Meat and Other Protein Products Fatty cuts of meat. Ribs, chicken wings, bacon, sausage, bologna, salami, chitterlings, fatback, hot dogs, bratwurst, and packaged luncheon meats. Salted nuts and seeds. Canned beans with salt. Dairy Whole or 2% milk, cream, half-and-half, and cream cheese. Whole-fat or sweetened yogurt. Full-fat cheeses or blue cheese. Nondairy creamers and whipped toppings. Processed cheese, cheese spreads, or cheese curds. Condiments Onion and garlic salt, seasoned salt, table salt, and sea salt. Canned and packaged gravies. Worcestershire sauce. Tartar sauce. Barbecue sauce. Teriyaki sauce. Soy sauce, including reduced sodium. Steak sauce. Fish sauce. Oyster sauce. Cocktail sauce. Horseradish. Ketchup and mustard. Meat flavorings and tenderizers. Bouillon cubes. Hot sauce. Tabasco sauce. Marinades. Taco seasonings. Relishes. Fats and Oils Butter, stick margarine, lard, shortening, ghee, and bacon fat. Coconut, palm kernel, or palm oils. Regular salad dressings. Other Pickles and olives. Salted popcorn and pretzels. The items listed above may not be a complete list of foods and beverages to avoid. Contact your dietitian for more information. WHERE CAN I FIND MORE INFORMATION? National Heart, Lung, and Blood Institute: travelstabloid.com   This information is not intended to replace advice given to you by your health care provider. Make  sure you discuss any questions you have with your health care provider.   Document Released: 01/06/2011 Document Revised: 02/07/2014 Document Reviewed: 11/21/2012 Elsevier Interactive Patient Education [employer].

## 2015-07-08 NOTE — Telephone Encounter (Signed)
Pt unable to void today at appt for drug screen will come back for urine and will give rx at that time.  Rx up front in rx book

## 2015-07-08 NOTE — Assessment & Plan Note (Signed)
Check lipids today; continue statin 

## 2015-07-08 NOTE — Assessment & Plan Note (Signed)
Using pain medicine appropriately no concerns; continue med, try turmeric

## 2015-07-08 NOTE — Progress Notes (Signed)
BP 142/78 mmHg  Pulse 82  Temp(Src) 98.4 F (36.9 C) (Oral)  Resp 16  Wt 183 lb 8 oz (83.235 kg)  SpO2 95%   Subjective:    Patient ID: Jocelyn [name], female    DOB: [birth year], 82 y.o.   MRN: [id number]  HPI: Jocelyn [name] is a 82 y.o. female  Chief Complaint  Patient presents with  . Medication Refill   Patient is new to me; her usual provider has been out of the office for quite some time so I will follow until he returns  High blood pressure; does add some salt to her foods; does not many salty foods or snacks Stroke is on her problem list; she says she did not have a stroke; had Bell's palsy She does not have angina; had stress test and passed just fine Arthritis; knees and hand; right hand mostly, can't hardly write good; taking just one naproxen a day; last Cr fine; back hurts and uses the hydrocodone  She has sleep apnea; machine got old, needs new CPAP machine, [facility] On PPI BID; no heartburn, no belly pain, no blood in stool  Depression screen [facility] 2/9 07/08/2015 12/15/2014 10/08/2014 08/19/2014 07/28/2014  Decreased Interest 0 0 0 0 0  Down, Depressed, Hopeless 0 0 0 0 0  PHQ - 2 Score 0 0 0 0 0   Relevant past medical, surgical, family and social history reviewed Past Medical History  Diagnosis Date  . Hyperlipidemia   . Hypertension   . GERD (gastroesophageal reflux disease)   . Lumbar pain   . Essential hypertension, benign 07/28/2014  . OSA on CPAP 08/01/2015  . Arthritis of both knees 08/01/2015   Past Surgical History  Procedure Laterality Date  . Cataract extraction    . Abdominal hysterectomy    . Cholecystectomy    . Lumbar disc surgery     Family History  Problem Relation Age of Onset  . Hypertension Daughter   . Diabetes Daughter    Social History  Substance Use Topics  . Smoking status: Never Smoker   . Smokeless tobacco: None  . Alcohol Use: No   Interim medical history since last visit reviewed. Allergies and medications  reviewed  Review of Systems Per HPI unless specifically indicated above     Objective:    BP 142/78 mmHg  Pulse 82  Temp(Src) 98.4 F (36.9 C) (Oral)  Resp 16  Wt 183 lb 8 oz (83.235 kg)  SpO2 95%  Wt Readings from Last 3 Encounters:  07/08/15 183 lb 8 oz (83.235 kg)  12/15/14 182 lb 12.8 oz (82.918 kg)  10/08/14 186 lb 1 oz (84.397 kg)   body mass index is 30.54 kg/(m^2).  Physical Exam  Constitutional: She appears well-developed and well-nourished. No distress.  obese  HENT:  Head: Normocephalic and atraumatic.  Eyes: EOM are normal. No scleral icterus.  Neck: No thyromegaly present.  Cardiovascular: Normal rate, regular rhythm and normal heart sounds.   No murmur heard. Pulmonary/Chest: Effort normal and breath sounds normal. No respiratory distress. She has no wheezes.  Abdominal: Soft. Bowel sounds are normal. She exhibits no distension.  Musculoskeletal: Normal range of motion. She exhibits no edema.  Neurological: She is alert. She exhibits normal muscle tone.  Skin: Skin is warm and dry. She is not diaphoretic. No pallor.  Psychiatric: She has a normal mood and affect. Her behavior is normal. Judgment and thought content normal.      Assessment & Plan:  Problem List Items Addressed This Visit      Cardiovascular and Mediastinum   Essential hypertension, benign - Primary    Fair control; previous diagnosis with hypertensive heart disease with heart failure; I cannot find anything in teh chart to support heart failure, so I am changing diagnosis to I10; encouraged DASH guidelines      Cerebrovascular accident (CVA) [facility])    Patient says that was bell's palsy and was NOT a stroke      Angina pectoris ([place])    This diagnosis is in her permanent problem list; upon review and history, patient and daughter do not think this is true; she reports passing her stress test and not suffering from any chest pain; however, I reviewed Dr. Etta Quill note from 2015 and he  does report stable angina treated with Imdur, aspirin, and amlodipine        Respiratory   OSA on CPAP    New diagnosis; patient reports being on CPAP and needing new machine, requesting order to [place]; she does not know cm H2O (settings) so we'll need those for treatment        Musculoskeletal and Integument   DDD (degenerative disc disease), lumbar    Using pain medicine appropriately no concerns; continue med, try turmeric      Relevant Medications   HYDROcodone-acetaminophen (NORCO/VICODIN) 5-325 MG tablet   Arthritis of both knees    Naproxen was in the 2015 note from her cardiologist, so he is aware she takes this; limited Rx for hydrocodone for worse pain; controlled substance contract signed      Relevant Medications   HYDROcodone-acetaminophen (NORCO/VICODIN) 5-325 MG tablet     Other   Medication monitoring encounter   Relevant Orders   CBC with Differential/Platelet (Completed)   Comprehensive metabolic panel (Completed)   HLD (hyperlipidemia)    Check lipids today; continue statin      Relevant Orders   Lipid Panel w/o Chol/HDL Ratio (Completed)   Edema extremities    No edema at all today      Controlled substance agreement signed    Discussed risk of controlled substances including possible unintentional overdose, especially if mixed with alcohol or other pills; typical speech given including illegal to share, even out of the goodness of patient's heart, always keep in the original bottle, safeguard medicine, do NOT mix with alcohol, other pain pills, "nerve" or anxiety pills, or sleeping pills; I am not obligated to approve of early refill or give new prescription if medicine is lost, stolen, or destroyed even with a police report, etc.; patient agrees with plan; controlled substance contract signed; copy of contract given to patient       Other Visit Diagnoses    Chronic pain        Relevant Medications    HYDROcodone-acetaminophen (NORCO/VICODIN) 5-325  MG tablet       Follow up plan: Return in about 3 months (around 10/08/2015) for medication follow-up.  An after-visit summary was printed and given to the patient at [place].  Please see the patient instructions which may contain other information and recommendations beyond what is mentioned above in the assessment and plan.  Meds ordered this encounter  Medications  . HYDROcodone-acetaminophen (NORCO/VICODIN) 5-325 MG tablet    Sig: Take 1 tablet by mouth every 8 (eight) hours as needed.    Dispense:  30 tablet    Refill:  0   Orders Placed This Encounter  Procedures  . CBC with Differential/Platelet  . Comprehensive  metabolic panel  . Lipid Panel w/o Chol/HDL Ratio   RX: CPAP to [facility]

## 2015-07-09 ENCOUNTER — Other Ambulatory Visit: Payer: Self-pay | Admitting: Family Medicine

## 2015-07-09 DIAGNOSIS — F1190 Opioid use, unspecified, uncomplicated: Secondary | ICD-10-CM | POA: Diagnosis not present

## 2015-07-09 DIAGNOSIS — F410 Panic disorder [episodic paroxysmal anxiety] without agoraphobia: Secondary | ICD-10-CM | POA: Diagnosis not present

## 2015-07-09 LAB — COMPREHENSIVE METABOLIC PANEL
ALBUMIN: 4.4 g/dL (ref 3.5–4.7)
ALT: 10 IU/L (ref 0–32)
AST: 16 IU/L (ref 0–40)
Albumin/Globulin Ratio: 1.3 (ref 1.2–2.2)
Alkaline Phosphatase: 64 IU/L (ref 39–117)
BILIRUBIN TOTAL: 0.7 mg/dL (ref 0.0–1.2)
BUN / CREAT RATIO: 26 (ref 12–28)
BUN: 24 mg/dL (ref 8–27)
CALCIUM: 9.7 mg/dL (ref 8.7–10.3)
CO2: 25 mmol/L (ref 18–29)
Chloride: 99 mmol/L (ref 96–106)
Creatinine, Ser: 0.93 mg/dL (ref 0.57–1.00)
GFR, EST AFRICAN AMERICAN: 66 mL/min/{1.73_m2} (ref >59)
GFR, EST NON AFRICAN AMERICAN: 57 mL/min/{1.73_m2} — AB (ref >59)
GLUCOSE: 101 mg/dL — AB (ref 65–99)
Globulin, Total: 3.3 g/dL (ref 1.5–4.5)
Potassium: 4.5 mmol/L (ref 3.5–5.2)
Sodium: 140 mmol/L (ref 134–144)
TOTAL PROTEIN: 7.7 g/dL (ref 6.0–8.5)

## 2015-07-09 LAB — CBC WITH DIFFERENTIAL/PLATELET
BASOS ABS: 0.1 10*3/uL (ref 0.0–0.2)
BASOS: 1 %
EOS (ABSOLUTE): 0.2 10*3/uL (ref 0.0–0.4)
Eos: 3 %
HEMOGLOBIN: 11.5 g/dL (ref 11.1–15.9)
Hematocrit: 36.3 % (ref 34.0–46.6)
IMMATURE GRANS (ABS): 0 10*3/uL (ref 0.0–0.1)
Immature Granulocytes: 0 %
LYMPHS: 52 %
Lymphocytes Absolute: 3.7 10*3/uL — ABNORMAL HIGH (ref 0.7–3.1)
MCH: 25.8 pg — AB (ref 26.6–33.0)
MCHC: 31.7 g/dL (ref 31.5–35.7)
MCV: 81 fL (ref 79–97)
MONOCYTES: 7 %
Monocytes Absolute: 0.5 10*3/uL (ref 0.1–0.9)
NEUTROS ABS: 2.6 10*3/uL (ref 1.4–7.0)
Neutrophils: 37 %
Platelets: 189 10*3/uL (ref 150–379)
RBC: 4.46 x10E6/uL (ref 3.77–5.28)
RDW: 14.4 % (ref 12.3–15.4)
WBC: 7 10*3/uL (ref 3.4–10.8)

## 2015-07-09 LAB — LIPID PANEL W/O CHOL/HDL RATIO
Cholesterol, Total: 166 mg/dL (ref 100–199)
HDL: 60 mg/dL (ref >39)
LDL CALC: 86 mg/dL (ref 0–99)
Triglycerides: 99 mg/dL (ref 0–149)
VLDL CHOLESTEROL CAL: 20 mg/dL (ref 5–40)

## 2015-07-19 ENCOUNTER — Other Ambulatory Visit: Payer: Self-pay | Admitting: Family Medicine

## 2015-07-20 ENCOUNTER — Telehealth: Payer: Self-pay | Admitting: Family Medicine

## 2015-07-20 NOTE — Telephone Encounter (Signed)
It looks like I responded to the lab results on June 8th and Jamie notified her that same morning What medicine is she needing refilled?

## 2015-07-20 NOTE — Telephone Encounter (Signed)
Pt notified Drug screen normal and pain med rx up front

## 2015-07-20 NOTE — Telephone Encounter (Signed)
Patient checking status on lab test results and is checking status on medication refill

## 2015-08-01 ENCOUNTER — Telehealth: Payer: Self-pay | Admitting: Family Medicine

## 2015-08-01 ENCOUNTER — Encounter: Payer: Self-pay | Admitting: Family Medicine

## 2015-08-01 DIAGNOSIS — Z9989 Dependence on other enabling machines and devices: Secondary | ICD-10-CM

## 2015-08-01 DIAGNOSIS — G4733 Obstructive sleep apnea (adult) (pediatric): Secondary | ICD-10-CM

## 2015-08-01 DIAGNOSIS — M170 Bilateral primary osteoarthritis of knee: Secondary | ICD-10-CM | POA: Insufficient documentation

## 2015-08-01 HISTORY — DX: Obstructive sleep apnea (adult) (pediatric): G47.33

## 2015-08-01 HISTORY — DX: Bilateral primary osteoarthritis of knee: M17.0

## 2015-08-01 NOTE — Assessment & Plan Note (Signed)
Naproxen was in the 2015 note from her cardiologist, so he is aware she takes this; limited Rx for hydrocodone for worse pain; controlled substance contract signed

## 2015-08-01 NOTE — Assessment & Plan Note (Signed)
New diagnosis; patient reports being on CPAP and needing new machine, requesting order to [place]; she does not know cm H2O (settings) so we'll need those for treatment

## 2015-08-01 NOTE — Telephone Encounter (Signed)
Patient needs an order for new CPAP machine to [facility] However, I can not find anything in the chart documenting previous sleep study, when done, what her settings should be  Please see if you can help and go through previous chart If we cannot find anything, please refer to pulmonologist for sleep study evaluation and CPAP treatment

## 2015-08-06 NOTE — Telephone Encounter (Signed)
Last sleep study was 10 years ago with Dr. Welford Roche. Machine and supplies do not work good anymore. Need new supplies if possible or a new sleep study ordered

## 2015-08-07 NOTE — Telephone Encounter (Signed)
Referral entered for pulm to evaluate and manage her OSA

## 2015-08-07 NOTE — Assessment & Plan Note (Signed)
Refer to pulm

## 2015-08-14 ENCOUNTER — Other Ambulatory Visit: Payer: Self-pay | Admitting: Family Medicine

## 2015-08-18 ENCOUNTER — Other Ambulatory Visit: Payer: Self-pay | Admitting: Family Medicine

## 2015-08-20 ENCOUNTER — Other Ambulatory Visit: Payer: Self-pay | Admitting: Family Medicine

## 2015-08-20 DIAGNOSIS — G8929 Other chronic pain: Secondary | ICD-10-CM

## 2015-08-20 DIAGNOSIS — K219 Gastro-esophageal reflux disease without esophagitis: Secondary | ICD-10-CM

## 2015-08-20 NOTE — Telephone Encounter (Signed)
Patient is requesting a refill on the following medications:  Hydroodone Pravastatin 40 mg Omprazole 20mg 

## 2015-08-22 MED ORDER — OMEPRAZOLE 20 MG PO CPDR: 20.0000 mg | DELAYED_RELEASE_CAPSULE | Freq: Every day | ORAL | Status: DC | PRN

## 2015-08-22 MED ORDER — PRAVASTATIN SODIUM 40 MG PO TABS: 40.0000 mg | ORAL_TABLET | Freq: Every day | ORAL | Status: DC

## 2015-08-22 MED ORDER — HYDROCODONE-ACETAMINOPHEN 5-325 MG PO TABS: 1.0000 | ORAL_TABLET | Freq: Three times a day (TID) | ORAL | Status: DC | PRN

## 2015-08-22 MED ORDER — CLONIDINE HCL 0.1 MG PO TABS: 0.1000 mg | ORAL_TABLET | Freq: Two times a day (BID) | ORAL | Status: DC

## 2015-08-22 NOTE — Telephone Encounter (Signed)
Sending Rxs to local pharmacy, but hydrocodone will need to be picked up; should have printed at the office (I'm working from home) and I'll sign it on Monday

## 2015-08-26 DIAGNOSIS — G4733 Obstructive sleep apnea (adult) (pediatric): Secondary | ICD-10-CM | POA: Diagnosis not present

## 2015-08-26 DIAGNOSIS — R0602 Shortness of breath: Secondary | ICD-10-CM | POA: Diagnosis not present

## 2015-08-26 DIAGNOSIS — J449 Chronic obstructive pulmonary disease, unspecified: Secondary | ICD-10-CM | POA: Diagnosis not present

## 2015-09-02 ENCOUNTER — Encounter: Payer: Self-pay | Admitting: Family Medicine

## 2015-09-02 ENCOUNTER — Ambulatory Visit (INDEPENDENT_AMBULATORY_CARE_PROVIDER_SITE_OTHER): Payer: Medicare Other | Admitting: Family Medicine

## 2015-09-02 DIAGNOSIS — E663 Overweight: Secondary | ICD-10-CM

## 2015-09-02 DIAGNOSIS — G5601 Carpal tunnel syndrome, right upper limb: Secondary | ICD-10-CM | POA: Diagnosis not present

## 2015-09-02 DIAGNOSIS — R7303 Prediabetes: Secondary | ICD-10-CM | POA: Diagnosis not present

## 2015-09-02 DIAGNOSIS — M5136 Other intervertebral disc degeneration, lumbar region: Secondary | ICD-10-CM | POA: Diagnosis not present

## 2015-09-02 DIAGNOSIS — I10 Essential (primary) hypertension: Secondary | ICD-10-CM

## 2015-09-02 DIAGNOSIS — Z862 Personal history of diseases of the blood and blood-forming organs and certain disorders involving the immune mechanism: Secondary | ICD-10-CM | POA: Diagnosis not present

## 2015-09-02 DIAGNOSIS — K219 Gastro-esophageal reflux disease without esophagitis: Secondary | ICD-10-CM

## 2015-09-02 HISTORY — DX: Personal history of diseases of the blood and blood-forming organs and certain disorders involving the immune mechanism: Z86.2

## 2015-09-02 HISTORY — DX: Prediabetes: R73.03

## 2015-09-02 LAB — CBC WITH DIFFERENTIAL/PLATELET
BASOS ABS: 63 {cells}/uL (ref 0–200)
BASOS PCT: 1 %
EOS ABS: 252 {cells}/uL (ref 15–500)
Eosinophils Relative: 4 %
HEMATOCRIT: 35.7 % (ref 35.0–45.0)
Hemoglobin: 11.5 g/dL — ABNORMAL LOW (ref 11.7–15.5)
Lymphocytes Relative: 49 %
Lymphs Abs: 3087 cells/uL (ref 850–3900)
MCH: 26.4 pg — AB (ref 27.0–33.0)
MCHC: 32.2 g/dL (ref 32.0–36.0)
MCV: 81.9 fL (ref 80.0–100.0)
MONO ABS: 378 {cells}/uL (ref 200–950)
MONOS PCT: 6 %
MPV: 10.3 fL (ref 7.5–12.5)
NEUTROS ABS: 2520 {cells}/uL (ref 1500–7800)
Neutrophils Relative %: 40 %
PLATELETS: 181 10*3/uL (ref 140–400)
RBC: 4.36 MIL/uL (ref 3.80–5.10)
RDW: 14.4 % (ref 11.0–15.0)
WBC: 6.3 10*3/uL (ref 3.8–10.8)

## 2015-09-02 LAB — BASIC METABOLIC PANEL WITH GFR
BUN: 22 mg/dL (ref 7–25)
CALCIUM: 9.3 mg/dL (ref 8.6–10.4)
CHLORIDE: 105 mmol/L (ref 98–110)
CO2: 25 mmol/L (ref 20–31)
CREATININE: 0.96 mg/dL — AB (ref 0.60–0.88)
GFR, Est African American: 64 mL/min (ref >=60)
GFR, Est Non African American: 55 mL/min — ABNORMAL LOW (ref >=60)
GLUCOSE: 88 mg/dL (ref 65–99)
Potassium: 3.8 mmol/L (ref 3.5–5.3)
Sodium: 139 mmol/L (ref 135–146)

## 2015-09-02 LAB — FERRITIN: FERRITIN: 54 ng/mL (ref 20–288)

## 2015-09-02 LAB — HEMOGLOBIN A1C
Hgb A1c MFr Bld: 6.2 % — ABNORMAL HIGH (ref <5.7)
Mean Plasma Glucose: 131 mg/dL

## 2015-09-02 NOTE — Progress Notes (Signed)
BP (!) 152/68   Pulse 95   Temp 98.3 F (36.8 C) (Oral)   Resp 16   Wt 179 lb 14.4 oz (81.6 kg)   SpO2 95%   BMI 29.94 kg/m    Subjective:    Patient ID: Jocelyn [name], female    DOB: 1931/06/12, 82 y.o.   MRN: [id number]  HPI: Jocelyn [name] is a 82 y.o. female  Chief Complaint  Patient presents with  . Hypertension  . Medication Refill   Hypertension; 30-40 years; HTN runs in the family; does add salt when cooking  No headaches, no pounding in ears, no vision problems No chest pain; no sweling in the legs; urinating fine Med list reviewed 06/26/15 90 day supply of the Tribenzor and no plain amlodipine  GERD; doing well on the omeprazole; ulcers a long time ago; she used to drink Pepsi, avoids Pepsi; no blood in the stool, no bellyt Discussed risk of PPI  Osteoarthritis; using pain medicine; helps her QOL; just when needed, not goofy; no constipation  Numbness in right fingers; worse at night  Prediabetes; last A1c 6.4  Depression screen [facility] 2/9 07/08/2015 12/15/2014 10/08/2014 08/19/2014 07/28/2014  Decreased Interest 0 0 0 0 0  Down, Depressed, Hopeless 0 0 0 0 0  PHQ - 2 Score 0 0 0 0 0    No flowsheet data found.  Relevant past medical, surgical, family and social history reviewed Past Medical History:  Diagnosis Date  . Arthritis of both knees 08/01/2015  . Essential hypertension, benign 07/28/2014  . GERD (gastroesophageal reflux disease)   . Hx of iron deficiency anemia 09/02/2015  . Hyperlipidemia   . Hypertension   . Lumbar pain   . OSA on CPAP 08/01/2015  . Overweight (BMI 25.0-29.9) 07/10/2013  . Prediabetes 09/02/2015   Past Surgical History:  Procedure Laterality Date  . ABDOMINAL HYSTERECTOMY    . CATARACT EXTRACTION    . CHOLECYSTECTOMY    . LUMBAR DISC SURGERY     Family History  Problem Relation Age of Onset  . Hypertension Daughter   . Diabetes Daughter    Social History  Substance Use Topics  . Smoking status: Never Smoker  .  Smokeless tobacco: Not on file  . Alcohol use No    Interim medical history since last visit reviewed. Allergies and medications reviewed  Review of Systems Per HPI unless specifically indicated above     Objective:    BP (!) 152/68   Pulse 95   Temp 98.3 F (36.8 C) (Oral)   Resp 16   Wt 179 lb 14.4 oz (81.6 kg)   SpO2 95%   BMI 29.94 kg/m   Wt Readings from Last 3 Encounters:  09/02/15 179 lb 14.4 oz (81.6 kg)  07/08/15 183 lb 8 oz (83.2 kg)  12/15/14 182 lb 12.8 oz (82.9 kg)    Physical Exam  Constitutional: She appears well-developed and well-nourished. No distress.  Overweight; weight down almost 4 pounds in 2 months  HENT:  Head: Normocephalic and atraumatic.  Eyes: EOM are normal. No scleral icterus.  Neck: No thyromegaly present.  Cardiovascular: Normal rate, regular rhythm and normal heart sounds.   No murmur heard. Pulmonary/Chest: Effort normal and breath sounds normal. No respiratory distress. She has no wheezes.  Abdominal: Soft. Bowel sounds are normal. She exhibits no distension.  Musculoskeletal: Normal range of motion. She exhibits no edema.  Neurological: She is alert. She exhibits normal muscle tone.  Positive Phalens on the  right and positive Tinels on the right; no loss of muscle tone; grip 5/5  Skin: Skin is warm and dry. She is not diaphoretic. No pallor.  Psychiatric: She has a normal mood and affect. Her behavior is normal. Judgment and thought content normal.      Assessment & Plan:   Problem List Items Addressed This Visit      Cardiovascular and Mediastinum   Essential hypertension, benign    Stressed DASH guidelines; monitor BP and let me know if not under 150        Digestive   Gastroesophageal reflux disease without esophagitis    See AVS for discussion and recommendations; risks of PPIs discussed; will try to taper      Relevant Medications   omeprazole (PRILOSEC) 20 MG capsule     Nervous and Auditory   Carpal tunnel  syndrome on right    Will have her try wearing brace on right wrist        Musculoskeletal and Integument   DDD (degenerative disc disease), lumbar    Continue pain meds PRN and AVOID NSAIDs        Other   Prediabetes    Discussed last A1c, check today      Relevant Orders   Hemoglobin A1C (Completed)   BASIC METABOLIC PANEL WITH GFR (Completed)   Overweight (BMI 25.0-29.9)    So glad patient is working on weight loss      Hx of iron deficiency anemia    Check CBC and ferritin      Relevant Orders   CBC with Differential/Platelet (Completed)   Ferritin (Completed)    Other Visit Diagnoses   None.     Follow up plan: No Follow-up on file.  An after-visit summary was printed and given to the patient at [facility].  Please see the patient instructions which may contain other information and recommendations beyond what is mentioned above in the assessment and plan.  Meds ordered this encounter  Medications  . albuterol (PROVENTIL HFA;VENTOLIN HFA) 108 (90 Base) MCG/ACT inhaler    Sig: Inhale into the lungs.  Marland Kitchen omeprazole (PRILOSEC) 20 MG capsule    Sig: Take five days a week for one month, then just three days a week for one month, then stop and use Zantac 150 mg daily instead    Orders Placed This Encounter  Procedures  . Hemoglobin A1C  . BASIC METABOLIC PANEL WITH GFR  . CBC with Differential/Platelet  . Ferritin

## 2015-09-02 NOTE — Assessment & Plan Note (Signed)
Discussed last A1c, check today

## 2015-09-02 NOTE — Assessment & Plan Note (Signed)
Continue pain meds PRN and AVOID NSAIDs

## 2015-09-02 NOTE — Assessment & Plan Note (Signed)
Check CBC and ferritin 

## 2015-09-02 NOTE — Patient Instructions (Addendum)
Try wearing a brace on the right wrist  Limit repetitive motions  Wean down on the omeprazole Okay to take ranitidine 150 mg daily if needed Caution: prolonged use of proton pump inhibitors like omeprazole (Prilosec), pantoprazole (Protonix), esomeprazole (Nexium), and others like Dexilant and Aciphex may increase your risk of pneumonia, Clostridium difficile colitis, osteoporosis, anemia and other health complications Try to limit or avoid triggers like coffee, caffeinated beverages, onions, chocolate, spicy foods, peppermint, acid foods like pizza, spaghetti sauce, and orange juice Lose weight if you are overweight or obese Try elevating the head of your bed by placing a small wedge between your mattress and box springs to keep acid in the stomach at night instead of coming up into your esophagus  DASH Eating Plan DASH stands for "Dietary Approaches to Stop Hypertension." The DASH eating plan is a healthy eating plan that has been shown to reduce high blood pressure (hypertension). Additional health benefits may include reducing the risk of type 2 diabetes mellitus, heart disease, and stroke. The DASH eating plan may also help with weight loss. WHAT DO I NEED TO KNOW ABOUT THE DASH EATING PLAN? For the DASH eating plan, you will follow these general guidelines:  Choose foods with a percent daily value for sodium of less than 5% (as listed on the food label).  Use salt-free seasonings or herbs instead of table salt or sea salt.  Check with your health care provider or pharmacist before using salt substitutes.  Eat lower-sodium products, often labeled as "lower sodium" or "no salt added."  Eat fresh foods.  Eat more vegetables, fruits, and low-fat dairy products.  Choose whole grains. Look for the word "whole" as the first word in the ingredient list.  Choose fish and skinless chicken or [place] more often than red meat. Limit fish, poultry, and meat to 6 oz (170 g) each day.  Limit  sweets, desserts, sugars, and sugary drinks.  Choose heart-healthy fats.  Limit cheese to 1 oz (28 g) per day.  Eat more home-cooked food and less restaurant, buffet, and fast food.  Limit fried foods.  Cook foods using methods other than frying.  Limit canned vegetables. If you do use them, rinse them well to decrease the sodium.  When eating at a restaurant, ask that your food be prepared with less salt, or no salt if possible. WHAT FOODS CAN I EAT? Seek help from a dietitian for individual calorie needs. Grains Whole grain or whole wheat bread. Brown rice. Whole grain or whole wheat pasta. Quinoa, bulgur, and whole grain cereals. Low-sodium cereals. Corn or whole wheat flour tortillas. Whole grain cornbread. Whole grain crackers. Low-sodium crackers. Vegetables Fresh or frozen vegetables (raw, steamed, roasted, or grilled). Low-sodium or reduced-sodium tomato and vegetable juices. Low-sodium or reduced-sodium tomato sauce and paste. Low-sodium or reduced-sodium canned vegetables.  Fruits All fresh, canned (in natural juice), or frozen fruits. Meat and Other Protein Products Ground beef (85% or leaner), grass-fed beef, or beef trimmed of fat. Skinless chicken or [place]. Ground chicken or [place]. Pork trimmed of fat. All fish and seafood. Eggs. Dried beans, peas, or lentils. Unsalted nuts and seeds. Unsalted canned beans. Dairy Low-fat dairy products, such as skim or 1% milk, 2% or reduced-fat cheeses, low-fat ricotta or cottage cheese, or plain low-fat yogurt. Low-sodium or reduced-sodium cheeses. Fats and Oils Tub margarines without trans fats. Light or reduced-fat mayonnaise and salad dressings (reduced sodium). Avocado. Safflower, olive, or canola oils. Natural peanut or almond butter. Other Unsalted popcorn and  pretzels. The items listed above may not be a complete list of recommended foods or beverages. Contact your dietitian for more options. WHAT FOODS ARE NOT  RECOMMENDED? Grains White bread. White pasta. White rice. Refined cornbread. Bagels and croissants. Crackers that contain trans fat. Vegetables Creamed or fried vegetables. Vegetables in a cheese sauce. Regular canned vegetables. Regular canned tomato sauce and paste. Regular tomato and vegetable juices. Fruits Dried fruits. Canned fruit in light or heavy syrup. Fruit juice. Meat and Other Protein Products Fatty cuts of meat. Ribs, chicken wings, bacon, sausage, bologna, salami, chitterlings, fatback, hot dogs, bratwurst, and packaged luncheon meats. Salted nuts and seeds. Canned beans with salt. Dairy Whole or 2% milk, cream, half-and-half, and cream cheese. Whole-fat or sweetened yogurt. Full-fat cheeses or blue cheese. Nondairy creamers and whipped toppings. Processed cheese, cheese spreads, or cheese curds. Condiments Onion and garlic salt, seasoned salt, table salt, and sea salt. Canned and packaged gravies. Worcestershire sauce. Tartar sauce. Barbecue sauce. Teriyaki sauce. Soy sauce, including reduced sodium. Steak sauce. Fish sauce. Oyster sauce. Cocktail sauce. Horseradish. Ketchup and mustard. Meat flavorings and tenderizers. Bouillon cubes. Hot sauce. Tabasco sauce. Marinades. Taco seasonings. Relishes. Fats and Oils Butter, stick margarine, lard, shortening, ghee, and bacon fat. Coconut, palm kernel, or palm oils. Regular salad dressings. Other Pickles and olives. Salted popcorn and pretzels. The items listed above may not be a complete list of foods and beverages to avoid. Contact your dietitian for more information. WHERE CAN I FIND MORE INFORMATION? National Heart, Lung, and Blood Institute: travelstabloid.com   This information is not intended to replace advice given to you by your health care provider. Make sure you discuss any questions you have with your health care provider.   Document Released: 01/06/2011 Document Revised: 02/07/2014  Document Reviewed: 11/21/2012 Elsevier Interactive Patient Education [employer].

## 2015-09-05 ENCOUNTER — Encounter: Payer: Self-pay | Admitting: Family Medicine

## 2015-09-05 DIAGNOSIS — K219 Gastro-esophageal reflux disease without esophagitis: Secondary | ICD-10-CM | POA: Insufficient documentation

## 2015-09-05 DIAGNOSIS — G5601 Carpal tunnel syndrome, right upper limb: Secondary | ICD-10-CM | POA: Insufficient documentation

## 2015-09-05 NOTE — Assessment & Plan Note (Signed)
Will have her try wearing brace on right wrist

## 2015-09-05 NOTE — Assessment & Plan Note (Addendum)
See AVS for discussion and recommendations; risks of PPIs discussed; will try to taper

## 2015-09-05 NOTE — Assessment & Plan Note (Signed)
So glad patient is working on weight loss

## 2015-09-05 NOTE — Assessment & Plan Note (Signed)
Stressed DASH guidelines; monitor BP and let me know if not under 150

## 2015-09-22 ENCOUNTER — Telehealth: Payer: Self-pay | Admitting: Family Medicine

## 2015-09-22 DIAGNOSIS — G8929 Other chronic pain: Secondary | ICD-10-CM

## 2015-09-22 MED ORDER — HYDROCODONE-ACETAMINOPHEN 5-325 MG PO TABS: 1.0000 | ORAL_TABLET | Freq: Three times a day (TID) | ORAL | 0 refills | Status: DC | PRN

## 2015-09-22 NOTE — Telephone Encounter (Signed)
[facility] website reviewed; no other prescribers; no red flags Rx approved; ready for pick up

## 2015-10-15 ENCOUNTER — Telehealth: Payer: Self-pay | Admitting: Family Medicine

## 2015-10-15 DIAGNOSIS — G8929 Other chronic pain: Secondary | ICD-10-CM

## 2015-10-15 NOTE — Telephone Encounter (Signed)
Patient is requesting a refill on the following medications:  Isosorbide Mononitrate 120mg  HydroCodone  Patient is using the [facility] pharmacy on [address].

## 2015-10-16 MED ORDER — ISOSORBIDE MONONITRATE ER 120 MG PO TB24: 120.0000 mg | ORAL_TABLET | Freq: Every day | ORAL | 1 refills | Status: DC

## 2015-10-16 NOTE — Telephone Encounter (Signed)
I sent the Rx for the isosorbide, but she is requesting the hydrocodone almost a full two weeks before it is usually due Please ask what is going on

## 2015-10-16 NOTE — Telephone Encounter (Signed)
Called pt to discuss refills no answer. Wouldn't allow me to leave voicemail

## 2015-10-16 NOTE — Telephone Encounter (Signed)
Pt called states she is using more b/c in a lot more pain and needs more often.  I told her she signed a pain contract.  Pt wanted me to ask if you could refill early or give more quantity

## 2015-10-16 NOTE — Telephone Encounter (Signed)
See previous note

## 2015-10-18 MED ORDER — HYDROCODONE-ACETAMINOPHEN 5-325 MG PO TABS: 1.0000 | ORAL_TABLET | Freq: Three times a day (TID) | ORAL | 0 refills | Status: DC | PRN

## 2015-10-18 NOTE — Telephone Encounter (Signed)
rx will be ready to pick up Monday If she is needing more than 30 pills per 30 days for this coming Rx, then ask her to make an appt to get her next refill

## 2015-10-19 NOTE — Telephone Encounter (Signed)
Left detailed voicemail

## 2015-11-19 ENCOUNTER — Other Ambulatory Visit: Payer: Self-pay | Admitting: Family Medicine

## 2015-11-19 DIAGNOSIS — K219 Gastro-esophageal reflux disease without esophagitis: Secondary | ICD-10-CM

## 2015-12-03 ENCOUNTER — Telehealth: Payer: Self-pay | Admitting: Family Medicine

## 2015-12-03 ENCOUNTER — Ambulatory Visit: Payer: Medicare Other | Admitting: Family Medicine

## 2015-12-03 DIAGNOSIS — G8929 Other chronic pain: Secondary | ICD-10-CM

## 2015-12-03 MED ORDER — HYDROCODONE-ACETAMINOPHEN 5-325 MG PO TABS: 1.0000 | ORAL_TABLET | Freq: Three times a day (TID) | ORAL | 0 refills | Status: DC | PRN

## 2015-12-03 NOTE — Telephone Encounter (Signed)
Rx will be ready to be picked up tomorrow morning; I'm sorry to hear that she fell; if she would like a referral to physical therapy for strengthening to help prevent future falls, please enter; thank you

## 2015-12-03 NOTE — Telephone Encounter (Signed)
Patient notified

## 2015-12-03 NOTE — Telephone Encounter (Signed)
Pt needs refill on her pain meds. She fell last week and is in pain and can not hardly walk. She had an appt but had to cancel for the Dr was out sick. She is due to come in next Thursday the 9th.

## 2015-12-10 ENCOUNTER — Encounter: Payer: Self-pay | Admitting: Family Medicine

## 2015-12-10 ENCOUNTER — Ambulatory Visit (INDEPENDENT_AMBULATORY_CARE_PROVIDER_SITE_OTHER): Payer: Medicare Other | Admitting: Family Medicine

## 2015-12-10 DIAGNOSIS — R634 Abnormal weight loss: Secondary | ICD-10-CM | POA: Diagnosis not present

## 2015-12-10 DIAGNOSIS — R7303 Prediabetes: Secondary | ICD-10-CM | POA: Diagnosis not present

## 2015-12-10 DIAGNOSIS — G8929 Other chronic pain: Secondary | ICD-10-CM | POA: Diagnosis not present

## 2015-12-10 DIAGNOSIS — M5136 Other intervertebral disc degeneration, lumbar region: Secondary | ICD-10-CM

## 2015-12-10 DIAGNOSIS — K219 Gastro-esophageal reflux disease without esophagitis: Secondary | ICD-10-CM

## 2015-12-10 DIAGNOSIS — Z79899 Other long term (current) drug therapy: Secondary | ICD-10-CM | POA: Diagnosis not present

## 2015-12-10 DIAGNOSIS — I10 Essential (primary) hypertension: Secondary | ICD-10-CM | POA: Diagnosis not present

## 2015-12-10 DIAGNOSIS — W19XXXA Unspecified fall, initial encounter: Secondary | ICD-10-CM

## 2015-12-10 DIAGNOSIS — M170 Bilateral primary osteoarthritis of knee: Secondary | ICD-10-CM | POA: Diagnosis not present

## 2015-12-10 DIAGNOSIS — I209 Angina pectoris, unspecified: Secondary | ICD-10-CM | POA: Diagnosis not present

## 2015-12-10 MED ORDER — OLMESARTAN-AMLODIPINE-HCTZ 40-5-12.5 MG PO TABS: 1.0000 | ORAL_TABLET | Freq: Every day | ORAL | 5 refills | Status: DC

## 2015-12-10 MED ORDER — RANITIDINE HCL 150 MG PO TABS: 150.0000 mg | ORAL_TABLET | Freq: Two times a day (BID) | ORAL | 5 refills | Status: DC

## 2015-12-10 MED ORDER — HYDROCODONE-ACETAMINOPHEN 5-325 MG PO TABS: 1.0000 | ORAL_TABLET | Freq: Three times a day (TID) | ORAL | 0 refills | Status: DC | PRN

## 2015-12-10 NOTE — Assessment & Plan Note (Addendum)
Patient has lost 10 pounds over the last 5 months; noted after she left while I was reviewing chart and previous weights; I tried to call her about this, reached another family member who said she wasn't back yet; I'll contact her later to determine if this has been her trying to lose weight with healthier eating, or whether we need to consider hyperthyroidism, malignancy, denture/food acquisition problem, decline in taste/smell, etc

## 2015-12-10 NOTE — Assessment & Plan Note (Signed)
Refer to physical therapist to try to regain some strength; I am not sure if patient meets criteria for home PT; she is currently driving, though we are recommending that she quit driving at this point; leaving the house is a significant inconvenience, and she cares for a husband at home with cancer; if she meets criteria for home PT, I am all for it; otherwise, family would have to help arrange transportation to PT appointments

## 2015-12-10 NOTE — Assessment & Plan Note (Signed)
Discussed the risks of PPIs, including anemia, osteoporosis, pneumonia, C diff, heart disease, renal disease, etc; will switch to H2 blocker

## 2015-12-10 NOTE — Assessment & Plan Note (Signed)
Blood pressure is on the low side; I'm going to decrease her thiazide and CCB component, and leave her ARB dose alone; I don't want low pressures to be a causative factor in her getting weak and falling; return in 2 weeks for BP recheck with CMA

## 2015-12-10 NOTE — Assessment & Plan Note (Addendum)
[facility] web site reviewed; no red flags; will allow more pills per month with increased pain from fall and arthritis; discussed keeping medicine safe, helping to avoid diversion

## 2015-12-10 NOTE — Assessment & Plan Note (Signed)
Last A1c showed good control; will plan to recheck in Feb

## 2015-12-10 NOTE — Progress Notes (Signed)
BP (!) 118/52   Pulse 60   Temp 98.3 F (36.8 C)   Resp 16   Wt 173 lb (78.5 kg)   SpO2 94%   BMI 28.79 kg/m    Subjective:    Patient ID: Jocelyn [name], female    DOB: [birth year], 82 y.o.   MRN: [id number]  HPI: Jocelyn [name] is a 82 y.o. female  Chief Complaint  Patient presents with  . Follow-up   She fell onto her knees; "can't walk good;" she just slipped out of the chair; she does not walk much; she was having problems with her knees from the get-go, then cane, then walker, then fell and hasn't   She has chronic pain, arthritis in her knees; she has hydrocodone; taking about two a day; does not make her loopy or goofy or drunk; not holding her pain as long as desired  Family has been trying to talk to her about giving up her driving; she is pretty much homebound; family says it is a hardship for her to leave the house; her husband has cancer and she helps care for him; she does drive but family has been on her about not driving; she drives to church and that's about it; daughter asked if I could talk to her about stopping driving  GERD; no hx of ulcer; no barretts esophagus, but she's been taking PPI  She has prediabetes; last A1c showed very good control Lab Results  Component Value Date   HGBA1C 6.2 (H) 09/02/2015   Depression screen [facility] 2/9 12/10/2015 07/08/2015 12/15/2014 10/08/2014 08/19/2014  Decreased Interest 0 0 0 0 0  Down, Depressed, Hopeless 0 0 0 0 0  PHQ - 2 Score 0 0 0 0 0   Relevant past medical, surgical, family and social history reviewed Past Medical History:  Diagnosis Date  . Arthritis of both knees 08/01/2015  . Essential hypertension, benign 07/28/2014  . GERD (gastroesophageal reflux disease)   . Hx of iron deficiency anemia 09/02/2015  . Hyperlipidemia   . Hypertension   . Lumbar pain   . OSA on CPAP 08/01/2015  . Overweight (BMI 25.0-29.9) 07/10/2013  . Prediabetes 09/02/2015   Past Surgical History:  Procedure Laterality Date  .  ABDOMINAL HYSTERECTOMY    . CATARACT EXTRACTION    . CHOLECYSTECTOMY    . LUMBAR DISC SURGERY     Family History  Problem Relation Age of Onset  . Hypertension Daughter   . Diabetes Daughter    Social History  Substance Use Topics  . Smoking status: Never Smoker  . Smokeless tobacco: Not on file  . Alcohol use No   Interim medical history since last visit reviewed. Allergies and medications reviewed  Review of Systems Per HPI unless specifically indicated above     Objective:    BP (!) 118/52   Pulse 60   Temp 98.3 F (36.8 C)   Resp 16   Wt 173 lb (78.5 kg)   SpO2 94%   BMI 28.79 kg/m   Wt Readings from Last 3 Encounters:  12/10/15 173 lb (78.5 kg)  09/02/15 179 lb 14.4 oz (81.6 kg)  07/08/15 183 lb 8 oz (83.2 kg)    Physical Exam  Constitutional: She appears well-developed and well-nourished. No distress.  Weight loss of 10 pounds over the last 5 months  HENT:  Head: Normocephalic and atraumatic.  Eyes: EOM are normal. No scleral icterus.  Neck: No thyromegaly present.  Cardiovascular: Normal rate, regular rhythm  and normal heart sounds.   Pulmonary/Chest: Effort normal and breath sounds normal.  Abdominal: Soft. Bowel sounds are normal. She exhibits no distension.  Musculoskeletal: Normal range of motion. She exhibits no edema.       Right knee: She exhibits swelling and deformity. She exhibits no effusion, no LCL laxity and no MCL laxity. No medial joint line and no lateral joint line tenderness noted.       Left knee: She exhibits swelling and deformity. She exhibits no LCL laxity and no MCL laxity. No tenderness found.  Neurological: She is alert. She exhibits normal muscle tone.  Gait not assessed  Skin: Skin is warm and dry. She is not diaphoretic. No pallor.  Psychiatric: She has a normal mood and affect. Her behavior is normal. Judgment and thought content normal. Her mood appears not anxious. She does not exhibit a depressed mood.   Results for  orders placed or performed in visit on 09/02/15  Hemoglobin A1C  Result Value Ref Range   Hgb A1c MFr Bld 6.2 (H) <5.7 %   Mean Plasma Glucose 131 mg/dL  BASIC METABOLIC PANEL WITH GFR  Result Value Ref Range   Sodium 139 135 - 146 mmol/L   Potassium 3.8 3.5 - 5.3 mmol/L   Chloride 105 98 - 110 mmol/L   CO2 25 20 - 31 mmol/L   Glucose, Bld 88 65 - 99 mg/dL   BUN 22 7 - 25 mg/dL   Creat 0.96 (H) 0.60 - 0.88 mg/dL   Calcium 9.3 8.6 - 10.4 mg/dL   GFR, Est African American 64 >=60 mL/min   GFR, Est Non African American 55 (L) >=60 mL/min  CBC with Differential/Platelet  Result Value Ref Range   WBC 6.3 3.8 - 10.8 K/uL   RBC 4.36 3.80 - 5.10 MIL/uL   Hemoglobin 11.5 (L) 11.7 - 15.5 g/dL   HCT 35.7 35.0 - 45.0 %   MCV 81.9 80.0 - 100.0 fL   MCH 26.4 (L) 27.0 - 33.0 pg   MCHC 32.2 32.0 - 36.0 g/dL   RDW 14.4 11.0 - 15.0 %   Platelets 181 140 - 400 K/uL   MPV 10.3 7.5 - 12.5 fL   Neutro Abs 2,520 1,500 - 7,800 cells/uL   Lymphs Abs 3,087 850 - 3,900 cells/uL   Monocytes Absolute 378 200 - 950 cells/uL   Eosinophils Absolute 252 15 - 500 cells/uL   Basophils Absolute 63 0 - 200 cells/uL   Neutrophils Relative % 40 %   Lymphocytes Relative 49 %   Monocytes Relative 6 %   Eosinophils Relative 4 %   Basophils Relative 1 %   Smear Review Criteria for review not met   Ferritin  Result Value Ref Range   Ferritin 54 20 - 288 ng/mL      Assessment & Plan:   Problem List Items Addressed This Visit      Cardiovascular and Mediastinum   Essential hypertension, benign    Blood pressure is on the low side; I'm going to decrease her thiazide and CCB component, and leave her ARB dose alone; I don't want low pressures to be a causative factor in her getting weak and falling; return in 2 weeks for BP recheck with CMA      Relevant Medications   Olmesartan-Amlodipine-HCTZ 40-5-12.5 MG TABS   Angina pectoris (HCC)    Followed by cardiologist, on long-acting nitrate      Relevant  Medications   Olmesartan-Amlodipine-HCTZ 40-5-12.5 MG TABS  Digestive   Gastroesophageal reflux disease without esophagitis    Discussed the risks of PPIs, including anemia, osteoporosis, pneumonia, C diff, heart disease, renal disease, etc; will switch to H2 blocker      Relevant Medications   ranitidine (ZANTAC) 150 MG tablet     Musculoskeletal and Integument   DDD (degenerative disc disease), lumbar    Pain control with hydrocodone; she does not feel loopy or overmedicated; do not think medicine contributes to fall      Relevant Medications   HYDROcodone-acetaminophen (NORCO/VICODIN) 5-325 MG tablet   Arthritis of both knees    Refer to ortho and PT, order xrays      Relevant Medications   HYDROcodone-acetaminophen (NORCO/VICODIN) 5-325 MG tablet   Other Relevant Orders   Ambulatory referral to Orthopedic Surgery   Ambulatory referral to Physical Therapy     Other   Weight loss, unintentional    Patient has lost 10 pounds over the last 5 months; noted after she left while I was reviewing chart and previous weights; I tried to call her about this, reached another family member who said she wasn't back yet; I'll contact her later to determine if this has been her trying to lose weight with healthier eating, or whether we need to consider hyperthyroidism, malignancy, denture/food acquisition problem, decline in taste/smell, etc      Prediabetes    Last A1c showed good control; will plan to recheck in Feb      Fall    Refer to physical therapist to try to regain some strength; I am not sure if patient meets criteria for home PT; she is currently driving, though we are recommending that she quit driving at this point; leaving the house is a significant inconvenience, and she cares for a husband at home with cancer; if she meets criteria for home PT, I am all for it; otherwise, family would have to help arrange transportation to PT appointments      Relevant Orders    Ambulatory referral to Orthopedic Surgery   Ambulatory referral to Physical Therapy   Controlled substance agreement signed    [place] web site reviewed; no red flags; will allow more pills per month with increased pain from fall and arthritis; discussed keeping medicine safe, helping to avoid diversion       Other Visit Diagnoses    Other chronic pain       Relevant Medications   HYDROcodone-acetaminophen (NORCO/VICODIN) 5-325 MG tablet      Follow up plan: Return in about 3 months (around 03/07/2016) for fasting labs and visit with Dr. Sanda Klein.  An after-visit summary was printed and given to the patient at [place].  Please see the patient instructions which may contain other information and recommendations beyond what is mentioned above in the assessment and plan.  Meds ordered this encounter  Medications  . Olmesartan-Amlodipine-HCTZ 40-5-12.5 MG TABS    Sig: Take 1 tablet by mouth daily.    Dispense:  30 tablet    Refill:  5    NOTE: CANCEL old refills of other strength; I'm decreasing her dose of the CCB and HCTZ components  . HYDROcodone-acetaminophen (NORCO/VICODIN) 5-325 MG tablet    Sig: Take 1 tablet by mouth every 8 (eight) hours as needed.    Dispense:  90 tablet    Refill:  0  . ranitidine (ZANTAC) 150 MG tablet    Sig: Take 1 tablet (150 mg total) by mouth 2 (two) times daily. If needed for reflux  and heartburn    Dispense:  60 tablet    Refill:  5    Orders Placed This Encounter  Procedures  . Ambulatory referral to Orthopedic Surgery  . Ambulatory referral to Physical Therapy

## 2015-12-10 NOTE — Assessment & Plan Note (Signed)
Refer to ortho and PT, order xrays

## 2015-12-10 NOTE — Assessment & Plan Note (Signed)
Followed by cardiologist, on long-acting nitrate

## 2015-12-10 NOTE — Assessment & Plan Note (Signed)
Pain control with hydrocodone; she does not feel loopy or overmedicated; do not think medicine contributes to fall

## 2015-12-10 NOTE — Patient Instructions (Signed)
Change the blood pressure medicine to the new pill Return in 2 weeks for a recheck BP with Roselyn Reef or [employer] have you see the orthopaedist and the physical therapy I recommend that you do not drive

## 2015-12-16 ENCOUNTER — Other Ambulatory Visit: Payer: Self-pay | Admitting: Family Medicine

## 2015-12-19 ENCOUNTER — Other Ambulatory Visit: Payer: Self-pay | Admitting: Family Medicine

## 2015-12-19 DIAGNOSIS — K219 Gastro-esophageal reflux disease without esophagitis: Secondary | ICD-10-CM

## 2015-12-21 NOTE — Telephone Encounter (Signed)
PPI was stopped and H2 blocker started

## 2015-12-22 ENCOUNTER — Telehealth: Payer: Self-pay | Admitting: Family Medicine

## 2015-12-22 DIAGNOSIS — M170 Bilateral primary osteoarthritis of knee: Secondary | ICD-10-CM | POA: Diagnosis not present

## 2015-12-22 NOTE — Telephone Encounter (Signed)
Please let the patient know that [facility] sent [place] a note warning about the risk of hydroxyzine in someone her age I'm going to recommend that she STOP taking that drug (I removed from med list) Thank you

## 2015-12-23 ENCOUNTER — Other Ambulatory Visit: Payer: Self-pay | Admitting: Family Medicine

## 2015-12-23 NOTE — Telephone Encounter (Signed)
Left detailed voicemail

## 2016-01-20 ENCOUNTER — Telehealth: Payer: Self-pay | Admitting: Family Medicine

## 2016-01-20 NOTE — Telephone Encounter (Signed)
Patient is completely out of hydroxyzine 25mg . Asking that you send to [facility] [address]

## 2016-01-21 ENCOUNTER — Other Ambulatory Visit: Payer: Self-pay

## 2016-01-21 NOTE — Telephone Encounter (Signed)
Please ask her to come in for an appointment Persistent itching can be a sign of liver trouble, eczema, other issues so if she is having persistent, daily itching requiring that type of medicine, please ask her to come in Also, please run through her medicine list with her I changed her olmesartan-amlodipine-hctz to a lower dose (40-5-12.5) but it looks like Dr. Manuella Ghazi refilled a prescription for her on 01/12/16 for the old higher dose again Please find out which ONE she is taking I really wanted her on the lower dose, since her previous BP was so low; did it go up again?

## 2016-01-21 NOTE — Telephone Encounter (Signed)
Patient asking a refill for Hydroxyzine. I saw that it was taking off her medication list. I looked in the notes and saw you took her off due to the risk because of her age. I called the patient to get a better understanding on what is going on and why she is asking for a refill. Patient stated to me that she is itching really bad and would like something for the itching. I explain to patient the reason she was taken off the medication. Is there anything else you can prescribe her for the itching.

## 2016-01-21 NOTE — Telephone Encounter (Signed)
Patient notified will schedule appointment, she found her bottle and she is on the lower dose you prescribed.  I will update list

## 2016-02-08 ENCOUNTER — Ambulatory Visit: Payer: Medicare Other | Admitting: Family Medicine

## 2016-02-23 ENCOUNTER — Other Ambulatory Visit: Payer: Self-pay | Admitting: Family Medicine

## 2016-02-29 ENCOUNTER — Encounter: Payer: Self-pay | Admitting: Family Medicine

## 2016-02-29 ENCOUNTER — Ambulatory Visit (INDEPENDENT_AMBULATORY_CARE_PROVIDER_SITE_OTHER): Payer: Medicare Other | Admitting: Family Medicine

## 2016-02-29 VITALS — BP 142/70 | HR 80 | Temp 98.1°F | Resp 16 | Ht 60.3 in | Wt 169.8 lb

## 2016-02-29 DIAGNOSIS — H9193 Unspecified hearing loss, bilateral: Secondary | ICD-10-CM | POA: Diagnosis not present

## 2016-02-29 DIAGNOSIS — Z0000 Encounter for general adult medical examination without abnormal findings: Secondary | ICD-10-CM | POA: Diagnosis not present

## 2016-02-29 DIAGNOSIS — Z23 Encounter for immunization: Secondary | ICD-10-CM

## 2016-02-29 DIAGNOSIS — Z13820 Encounter for screening for osteoporosis: Secondary | ICD-10-CM | POA: Diagnosis not present

## 2016-02-29 NOTE — Assessment & Plan Note (Signed)
USPSTF grade A and B recommendations reviewed with patient; age-appropriate recommendations, preventive care, screening tests, etc discussed and encouraged; healthy living encouraged; see AVS for patient education given to patient  

## 2016-02-29 NOTE — Patient Instructions (Addendum)
Start vitamin D3 and take 1,000 iu daily Please do consider appointing a health care power of attorney Do try to keep your legs strong to help prevent falls We'll have you see an audiologist for your hearing   Fall Prevention in the Home Introduction Falls can cause injuries. They can happen to people of all ages. There are many things you can do to make your home safe and to help prevent falls. What can I do on the outside of my home?  Regularly fix the edges of walkways and driveways and fix any cracks.  Remove anything that might make you trip as you walk through a door, such as a raised step or threshold.  Trim any bushes or trees on the path to your home.  Use bright outdoor lighting.  Clear any walking paths of anything that might make someone trip, such as rocks or tools.  Regularly check to see if handrails are loose or broken. Make sure that both sides of any steps have handrails.  Any raised decks and porches should have guardrails on the edges.  Have any leaves, snow, or ice cleared regularly.  Use sand or salt on walking paths during winter.  Clean up any spills in your garage right away. This includes oil or grease spills. What can I do in the bathroom?  Use night lights.  Install grab bars by the toilet and in the tub and shower. Do not use towel bars as grab bars.  Use non-skid mats or decals in the tub or shower.  If you need to sit down in the shower, use a plastic, non-slip stool.  Keep the floor dry. Clean up any water that spills on the floor as soon as it happens.  Remove soap buildup in the tub or shower regularly.  Attach bath mats securely with double-sided non-slip rug tape.  Do not have throw rugs and other things on the floor that can make you trip. What can I do in the bedroom?  Use night lights.  Make sure that you have a light by your bed that is easy to reach.  Do not use any sheets or blankets that are too big for your bed. They  should not hang down onto the floor.  Have a firm chair that has side arms. You can use this for support while you get dressed.  Do not have throw rugs and other things on the floor that can make you trip. What can I do in the kitchen?  Clean up any spills right away.  Avoid walking on wet floors.  Keep items that you use a lot in easy-to-reach places.  If you need to reach something above you, use a strong step stool that has a grab bar.  Keep electrical cords out of the way.  Do not use floor polish or wax that makes floors slippery. If you must use wax, use non-skid floor wax.  Do not have throw rugs and other things on the floor that can make you trip. What can I do with my stairs?  Do not leave any items on the stairs.  Make sure that there are handrails on both sides of the stairs and use them. Fix handrails that are broken or loose. Make sure that handrails are as long as the stairways.  Check any carpeting to make sure that it is firmly attached to the stairs. Fix any carpet that is loose or worn.  Avoid having throw rugs at the top or bottom  of the stairs. If you do have throw rugs, attach them to the floor with carpet tape.  Make sure that you have a light switch at the top of the stairs and the bottom of the stairs. If you do not have them, ask someone to add them for you. What else can I do to help prevent falls?  Wear shoes that:  Do not have high heels.  Have rubber bottoms.  Are comfortable and fit you well.  Are closed at the toe. Do not wear sandals.  If you use a stepladder:  Make sure that it is fully opened. Do not climb a closed stepladder.  Make sure that both sides of the stepladder are locked into place.  Ask someone to hold it for you, if possible.  Clearly mark and make sure that you can see:  Any grab bars or handrails.  First and last steps.  Where the edge of each step is.  Use tools that help you move around (mobility aids) if  they are needed. These include:  Canes.  Walkers.  Scooters.  Crutches.  Turn on the lights when you go into a dark area. Replace any light bulbs as soon as they burn out.  Set up your furniture so you have a clear path. Avoid moving your furniture around.  If any of your floors are uneven, fix them.  If there are any pets around you, be aware of where they are.  Review your medicines with your doctor. Some medicines can make you feel dizzy. This can increase your chance of falling. Ask your doctor what other things that you can do to help prevent falls. This information is not intended to replace advice given to you by your health care provider. Make sure you discuss any questions you have with your health care provider. Document Released: 11/13/2008 Document Revised: 06/25/2015 Document Reviewed: 02/21/2014  2017 Elsevier Advance Directive Advance directives are the legal documents that allow you to make choices about your health care and medical treatment if you cannot speak for yourself. Advance directives are a way for you to communicate your wishes to family, friends, and health care providers. The specified people can then convey your decisions about end-of-life care to avoid confusion if you should become unable to communicate. Ideally, the process of discussing and writing advance directives should happen over time rather than making decisions all at once. Advance directives can be modified as your situation changes, and you can change your mind at any time, even after you have signed the advance directives. Each state has its own laws regarding advance directives. You may want to check with your health care provider, attorney, or state representative about the law in your state. Below are some examples of advance directives. HEALTH CARE PROXY AND DURABLE POWER OF ATTORNEY FOR HEALTH CARE A health care proxy is a person (agent) appointed to make medical decisions for you if you  cannot. Generally, people choose someone they know well and trust to represent their preferences when they can no longer do so. You should be sure to ask this person for agreement to act as your agent. An agent may have to exercise judgment in the event of a medical decision for which your wishes are not known. A durable power of attorney for health care is a legal document that names your health care proxy. Depending on the laws in your state, after the document is written, it may also need to be:  Signed.  Notarized.  Dated.  Copied.  Witnessed.  Incorporated into your medical record. You may also want to appoint someone to manage your financial affairs if you cannot. This is called a durable power of attorney for finances. It is a separate legal document from the durable power of attorney for health care. You may choose the same person or someone different from your health care proxy to act as your agent in financial matters. LIVING WILL A living will is a set of instructions documenting your wishes about medical care when you cannot care for yourself. It is used if you become:  Terminally ill.  Incapacitated.  Unable to communicate.  Unable to make decisions. Items to consider in your living will include:  The use or non-use of life-sustaining equipment, such as dialysis machines and breathing machines (ventilators).  A do not resuscitate (DNR) order, which is the instruction not to use cardiopulmonary resuscitation (CPR) if breathing or heartbeat stops.  Tube feeding.  Withholding of food and fluids.  Comfort (palliative) care when the goal becomes comfort rather than a cure.  Organ and tissue donation. A living will does not give instructions about distribution of your money and property if you should pass away. It is advisable to seek the expert advice of a lawyer in drawing up a will regarding your possessions. Decisions about taxes, beneficiaries, and asset distribution  will be legally binding. This process can relieve your family and friends of any burdens surrounding disputes or questions that may come up about the allocation of your assets. DO NOT RESUSCITATE (DNR) A do not resuscitate (DNR) order is a request to not have CPR in the event that your heart stops beating or you stop breathing. Unless given other instructions, a health care provider will try to help any patient whose heart has stopped or who has stopped breathing.  This information is not intended to replace advice given to you by your health care provider. Make sure you discuss any questions you have with your health care provider. Document Released: 04/26/2007 Document Revised: 05/11/2015 Document Reviewed: 06/06/2012 Elsevier Interactive Patient Education  2017 [employer].

## 2016-02-29 NOTE — Progress Notes (Signed)
Patient: Jocelyn [name], Female    DOB: 06-28-1931, 83 y.o.   MRN: [id number]  Visit Date: 02/29/2016  Today's Provider: Enid Derry, MD   Chief Complaint  Patient presents with  . Annual Exam    medicare wellness    Subjective:   MONASIA [name] is a 83 y.o. female who presents today for her Subsequent Annual Wellness Visit.  Caregiver input:  Daughter  Since last visit, no medical excitement  USPSTF grade A and B recommendations Alcohol: no Depression:  Depression screen [facility] 2/9 02/29/2016 12/10/2015 07/08/2015 12/15/2014 10/08/2014  Decreased Interest 0 0 0 0 0  Down, Depressed, Hopeless 1 0 0 0 0  PHQ - 2 Score 1 0 0 0 0  Hypertension: controlled Obesity: will try to lose five pounds Tobacco use: nonsmoker Lipids: due in February Glucose: due in February Colorectal cancer: n/a Breast cancer: "do I have to get them"; patient wants to stop them Lung cancer: n/a Osteoporosis: offered DEXA Fall prevention/vitamin D: discussed; start vit D AAA: n/a Aspirin: taking aspirin Diet: pretty good; getting greens Exercise: not doing exercise; uses a walker, but has a chair that has wheels and arms Skin cancer: nothing worrisome  HPI  Review of Systems  Past Medical History:  Diagnosis Date  . Arthritis of both knees 08/01/2015  . Essential hypertension, benign 07/28/2014  . GERD (gastroesophageal reflux disease)   . Hx of iron deficiency anemia 09/02/2015  . Hyperlipidemia   . Hypertension   . Lumbar pain   . OSA on CPAP 08/01/2015  . Overweight (BMI 25.0-29.9) 07/10/2013  . Prediabetes 09/02/2015    Past Surgical History:  Procedure Laterality Date  . ABDOMINAL HYSTERECTOMY    . CATARACT EXTRACTION    . CHOLECYSTECTOMY    . LUMBAR DISC SURGERY     Family History  Problem Relation Age of Onset  . Hypertension Daughter   . Diabetes Daughter   . Diabetes Mother   . Heart disease Mother   . Cancer Father     lung; smoker   MD note: patient is widowed; her husband  died in 01-26-16; together for 60 years Social History   Social History  . Marital status: Married    Spouse name: N/A  . Number of children: N/A  . Years of education: N/A   Occupational History  . Not on file.   Social History Main Topics  . Smoking status: Never Smoker  . Smokeless tobacco: Never Used  . Alcohol use No  . Drug use: No  . Sexual activity: Not Currently   Other Topics Concern  . Not on file   Social History Narrative  . No narrative on file    Outpatient Encounter Prescriptions as of 02/29/2016  Medication Sig Note  . albuterol (PROVENTIL HFA;VENTOLIN HFA) 108 (90 Base) MCG/ACT inhaler Inhale into the lungs. 09/02/2015: Received from: [place]: Inhale 2 inhalations into the lungs every 6 (six) hours as needed for Wheezing.  Marland Kitchen aspirin EC 81 MG tablet Take 1 tablet (81 mg total) by mouth daily.   . cloNIDine (CATAPRES) 0.1 MG tablet TAKE 1 TABLET (0.1 MG TOTAL) BY MOUTH 2 (TWO) TIMES DAILY.   Marland Kitchen diclofenac sodium (VOLTAREN) 1 % GEL Apply 2 g topically 4 (four) times daily. (Patient not taking: Reported on 12/10/2015) 08/19/2014: Patient stated this was never sent to the pharmacy  . isosorbide mononitrate (IMDUR) 120 MG 24 hr tablet Take 1 tablet (120 mg total) by mouth daily.   Marland Kitchen  Olmesartan-Amlodipine-HCTZ 40-5-12.5 MG TABS Take 1 tablet by mouth daily.   . pravastatin (PRAVACHOL) 40 MG tablet Take 1 tablet (40 mg total) by mouth daily.   . ranitidine (ZANTAC) 150 MG tablet Take 1 tablet (150 mg total) by mouth 2 (two) times daily. If needed for reflux and heartburn   . [DISCONTINUED] HYDROcodone-acetaminophen (NORCO/VICODIN) 5-325 MG tablet Take 1 tablet by mouth every 8 (eight) hours as needed.    No facility-administered encounter medications on file as of 02/29/2016.    Urinary incontinence; wears pads  Functional Ability / Safety Screening 1.  Was the timed Get Up and Go test longer than 30 seconds?  yes 2.  Does the  patient need help with the phone, transportation, shopping,      preparing meals, housework, laundry, medications, or managing money?  yes  Patient stays home by herself most of the time; daughter is her transportation; no problems with the phone 3.  Does the patient's home have:  loose throw rugs in the hallway?   no      Grab bars in the bathroom? yes      Handrails on the stairs?   yes      Poor lighting?   no 4.  Has the patient noticed any hearing difficulties?   yes; offered referral to audiologist  Fall Risk Assessment See under rooming  Depression Screen See under rooming Depression screen [facility] 2/9 02/29/2016 12/10/2015 07/08/2015 12/15/2014 10/08/2014  Decreased Interest 0 0 0 0 0  Down, Depressed, Hopeless 1 0 0 0 0  PHQ - 2 Score 1 0 0 0 0   Advanced Directives Does patient have a HCPOA?    no If yes, name and contact information:  Does patient have a living will or MOST form?  no  Objective:   Vitals: BP (!) 142/70   Pulse 80   Temp 98.1 F (36.7 C) (Oral)   Resp 16   Ht 5' 0.3" (1.532 m)   Wt 169 lb 12.8 oz (77 kg)   SpO2 95%   BMI 32.83 kg/m  Body mass index is 32.83 kg/m. No exam data present  Physical Exam  Constitutional: She appears well-developed and well-nourished.  Obese; elderly female seated in wheelchair  Cardiovascular: Normal rate.   Neurological:  Able to get up out of wheelchair on her own; took a few steps, wide-spaced gait   Mood/affect:  Briefly sad appearing when discussing her husband's death; good eye contact with examiner; denies SI/HI Appearance:  Casually dressed, seated in wheelchair  Cognitive Testing - 6-CIT  Correct? Score   What year is it? yes 0 Yes = 0    No = 4  What month is it? yes 0 Yes = 0    No = 3  Remember:     [address], [place], [place]     What time is it? yes 0 Yes = 0    No = 3  Count backwards from 20 to 1 yes 0 Correct = 0    1 error = 2   More than 1 error = 4  Say the months of the year in reverse.  no 2 Correct = 0    1 error = 2   More than 1 error = 4  What address did I ask you to remember? no 2 Correct = 0  1 error = 2    2 error = 4    3 error = 6  4 error = 8    All wrong = 10       TOTAL SCORE  4/28   Interpretation:  Normal  Normal (0-7) Abnormal (8-28)    Assessment & Plan:     Annual Wellness Visit  Reviewed patient's Family Medical History Reviewed and updated list of patient's medical providers Assessment of cognitive impairment was done Assessed patient's functional ability Established a written schedule for health screening [place] Completed and Reviewed  Exercise Activities and Dietary recommendations Try to strengthen leg muscles; spent time discussing fall prevention; she was not interested in PT referral  Immunization History  Administered Date(s) Administered  . Influenza, High Dose Seasonal PF 10/08/2014, 02/29/2016  . Influenza-Unspecified 10/31/2012  . Pneumococcal Conjugate-13 02/29/2016  . Pneumococcal Polysaccharide-23 11/09/2011  . Tdap 07/09/2010  . Zoster 11/09/2011  PCV-13 given today No further pneumonia vaccines needed per current ACIP guidelines  Health Maintenance  Topic Date Due  . DEXA SCAN  06/23/1998  . TETANUS/TDAP  07/08/2020  . INFLUENZA VACCINE  Completed  . ZOSTAVAX  Completed  . PNA vac Low Risk Adult  Completed    Discussed health benefits of physical activity, and encouraged her to engage in regular exercise appropriate for her age and condition.   No orders of the defined types were placed in this encounter.   Current Outpatient Prescriptions:  .  albuterol (PROVENTIL HFA;VENTOLIN HFA) 108 (90 Base) MCG/ACT inhaler, Inhale into the lungs., Disp: , Rfl:  .  aspirin EC 81 MG tablet, Take 1 tablet (81 mg total) by mouth daily., Disp: 30 tablet, Rfl: 12 .  cloNIDine (CATAPRES) 0.1 MG tablet, TAKE 1 TABLET (0.1 MG TOTAL) BY MOUTH 2 (TWO) TIMES DAILY., Disp: 180 tablet, Rfl: 3 .  diclofenac  sodium (VOLTAREN) 1 % GEL, Apply 2 g topically 4 (four) times daily. (Patient not taking: Reported on 12/10/2015), Disp: 4 Tube, Rfl: 5 .  isosorbide mononitrate (IMDUR) 120 MG 24 hr tablet, Take 1 tablet (120 mg total) by mouth daily., Disp: 90 tablet, Rfl: 1 .  Olmesartan-Amlodipine-HCTZ 40-5-12.5 MG TABS, Take 1 tablet by mouth daily., Disp: 30 tablet, Rfl: 5 .  pravastatin (PRAVACHOL) 40 MG tablet, Take 1 tablet (40 mg total) by mouth daily., Disp: 90 tablet, Rfl: 1 .  ranitidine (ZANTAC) 150 MG tablet, Take 1 tablet (150 mg total) by mouth 2 (two) times daily. If needed for reflux and heartburn, Disp: 60 tablet, Rfl: 5 Medications Discontinued During This Encounter  Medication Reason  . HYDROcodone-acetaminophen (NORCO/VICODIN) 5-325 MG tablet     Next Medicare Wellness Visit in 12+ months

## 2016-02-29 NOTE — Assessment & Plan Note (Signed)
Refer to audiologist  

## 2016-02-29 NOTE — Assessment & Plan Note (Signed)
Order DEXA; fall prevention discussed; start vitamin D 1000 iu daily

## 2016-03-07 ENCOUNTER — Ambulatory Visit: Payer: Medicare Other | Admitting: Family Medicine

## 2016-03-30 DIAGNOSIS — H903 Sensorineural hearing loss, bilateral: Secondary | ICD-10-CM | POA: Diagnosis not present

## 2016-04-14 ENCOUNTER — Other Ambulatory Visit: Payer: Self-pay | Admitting: Family Medicine

## 2016-05-23 ENCOUNTER — Other Ambulatory Visit: Payer: Self-pay | Admitting: Family Medicine

## 2016-06-13 ENCOUNTER — Other Ambulatory Visit: Payer: Self-pay

## 2016-06-13 MED ORDER — RANITIDINE HCL 150 MG PO TABS
150.0000 mg | ORAL_TABLET | Freq: Two times a day (BID) | ORAL | 5 refills | Status: DC
Start: 2016-06-13 — End: 2016-11-30

## 2016-07-05 ENCOUNTER — Other Ambulatory Visit: Payer: Self-pay | Admitting: Family Medicine

## 2016-07-07 ENCOUNTER — Ambulatory Visit (INDEPENDENT_AMBULATORY_CARE_PROVIDER_SITE_OTHER): Payer: Medicare Other | Admitting: Family Medicine

## 2016-07-07 ENCOUNTER — Encounter: Payer: Self-pay | Admitting: Family Medicine

## 2016-07-07 DIAGNOSIS — Z79899 Other long term (current) drug therapy: Secondary | ICD-10-CM

## 2016-07-07 DIAGNOSIS — Z5181 Encounter for therapeutic drug level monitoring: Secondary | ICD-10-CM | POA: Diagnosis not present

## 2016-07-07 DIAGNOSIS — E669 Obesity, unspecified: Secondary | ICD-10-CM | POA: Diagnosis not present

## 2016-07-07 DIAGNOSIS — K219 Gastro-esophageal reflux disease without esophagitis: Secondary | ICD-10-CM | POA: Diagnosis not present

## 2016-07-07 DIAGNOSIS — E782 Mixed hyperlipidemia: Secondary | ICD-10-CM

## 2016-07-07 DIAGNOSIS — R7303 Prediabetes: Secondary | ICD-10-CM

## 2016-07-07 DIAGNOSIS — M170 Bilateral primary osteoarthritis of knee: Secondary | ICD-10-CM | POA: Diagnosis not present

## 2016-07-07 DIAGNOSIS — E66811 Obesity, class 1: Secondary | ICD-10-CM

## 2016-07-07 MED ORDER — CLONIDINE HCL 0.1 MG PO TABS: 0.1000 mg | ORAL_TABLET | Freq: Three times a day (TID) | ORAL | 3 refills | Status: DC

## 2016-07-07 MED ORDER — OLMESARTAN-AMLODIPINE-HCTZ 40-10-25 MG PO TABS: 1.0000 | ORAL_TABLET | Freq: Every day | ORAL | 3 refills | Status: DC

## 2016-07-07 MED ORDER — HYDROCODONE-ACETAMINOPHEN 5-325 MG PO TABS: 1.0000 | ORAL_TABLET | Freq: Every day | ORAL | 0 refills | Status: DC

## 2016-07-07 NOTE — Assessment & Plan Note (Signed)
Continue statin; check lipids today; work on modest weight loss

## 2016-07-07 NOTE — Assessment & Plan Note (Addendum)
[place] web site reviewed over the last year; will refill medicine; new contract today; UDS per routine

## 2016-07-07 NOTE — Patient Instructions (Signed)
Return to see CMA here in the office in one week for a recheck of your blood pressure and pulse Your goal blood pressure is less than 140 mmHg on top. Try to follow the DASH guidelines (DASH stands for Dietary Approaches to Stop Hypertension) Try to limit the sodium in your diet.  Ideally, consume less than 1.5 grams (less than 1,500mg ) per day. Do not add salt when cooking or at the table.  Check the sodium amount on labels when shopping, and choose items lower in sodium when given a choice. Avoid or limit foods that already contain a lot of sodium. Eat a diet rich in fruits and vegetables and whole grains. Try to lose five pounds over the next month or two

## 2016-07-07 NOTE — Progress Notes (Signed)
BP (!) 158/54 (BP Location: Right Arm, Patient Position: Sitting, Cuff Size: Large)   Pulse 84   Temp 97.8 F (36.6 C) (Oral)   Resp 16   Ht 5' (1.524 m)   Wt 170 lb 6.4 oz (77.3 kg)   SpO2 97%   BMI 33.28 kg/m    Subjective:    Patient ID: Jocelyn [name], female    DOB: Jul 04, 1931, 83 y.o.   MRN: [id number]  HPI: Jocelyn [name] is a 83 y.o. female  Chief Complaint  Patient presents with  . Medication Refill  . Hypertension  . Gastroesophageal Reflux  . Hyperlipidemia   HPI Patient is here for follow-up  High cholesterol; trying to do a good job with her diet; taking pravastatin; no problems Lab Results  Component Value Date   CHOL 166 07/08/2015   HDL 60 07/08/2015   LDLCALC 86 07/08/2015   TRIG 99 07/08/2015   CHOLHDL 2.7 10/08/2014   Prediabetes; drinks sprite (cautioned about sugar in drinks); not much bread; two daughters with diabetes  HTN; a little high today; she has been taking the higher dose of Tribenzor for a while; not checking BP at home; does not feel dizzy or light-headed or shaky when she stands up  Depression screen [facility] 2/9 07/07/2016 02/29/2016 12/10/2015 07/08/2015 12/15/2014  Decreased Interest 0 0 0 0 0  Down, Depressed, Hopeless 0 1 0 0 0  PHQ - 2 Score 0 1 0 0 0   She has arthritis in her knees; uses hydrocodone once a day in the morning; does not make her feel bad at all  Heartburn well-controlled with ranitidine  Relevant past medical, surgical, family and social history reviewed Past Medical History:  Diagnosis Date  . Arthritis of both knees 08/01/2015  . Essential hypertension, benign 07/28/2014  . GERD (gastroesophageal reflux disease)   . Hx of iron deficiency anemia 09/02/2015  . Hyperlipidemia   . Hypertension   . Lumbar pain   . OSA on CPAP 08/01/2015  . Overweight (BMI 25.0-29.9) 07/10/2013  . Prediabetes 09/02/2015   Past Surgical History:  Procedure Laterality Date  . ABDOMINAL HYSTERECTOMY    . CATARACT EXTRACTION    .  CHOLECYSTECTOMY    . LUMBAR DISC SURGERY     Family History  Problem Relation Age of Onset  . Hypertension Daughter   . Diabetes Daughter   . Diabetes Mother   . Heart disease Mother   . Cancer Father        lung; smoker   Social History   Social History  . Marital status: Married    Spouse name: N/A  . Number of children: N/A  . Years of education: N/A   Occupational History  . Not on file.   Social History Main Topics  . Smoking status: Never Smoker  . Smokeless tobacco: Never Used  . Alcohol use No  . Drug use: No  . Sexual activity: Not Currently   Other Topics Concern  . Not on file   Social History Narrative  . No narrative on file  MD note: widowed, husband died in 2016-01-11  Interim medical history since last visit reviewed. Allergies and medications reviewed  Review of Systems Per HPI unless specifically indicated above     Objective:    BP (!) 158/54 (BP Location: Right Arm, Patient Position: Sitting, Cuff Size: Large)   Pulse 84   Temp 97.8 F (36.6 C) (Oral)   Resp 16   Ht 5' (  1.524 m)   Wt 170 lb 6.4 oz (77.3 kg)   SpO2 97%   BMI 33.28 kg/m   Wt Readings from Last 3 Encounters:  07/07/16 170 lb 6.4 oz (77.3 kg)  02/29/16 169 lb 12.8 oz (77 kg)  12/10/15 173 lb (78.5 kg)    Physical Exam  Constitutional: She appears well-developed and well-nourished. No distress.  Weight gain less than a pound over last 4+ months  HENT:  Head: Normocephalic and atraumatic.  Eyes: EOM are normal. No scleral icterus.  Neck: No thyromegaly present.  Cardiovascular: Normal rate, regular rhythm and normal heart sounds.   Pulmonary/Chest: Effort normal and breath sounds normal.  Abdominal: Soft. Normal appearance and bowel sounds are normal. She exhibits no distension.  Musculoskeletal: Normal range of motion. She exhibits no edema.       Right knee: She exhibits swelling and deformity. She exhibits no effusion, no LCL laxity and no MCL laxity. No  medial joint line and no lateral joint line tenderness noted.       Left knee: She exhibits swelling and deformity. She exhibits no LCL laxity and no MCL laxity. No tenderness found.  Neurological: She is alert. She displays no tremor.  Gait not assessed; using a walker today  Skin: Skin is warm and dry. She is not diaphoretic. No pallor.  Psychiatric: She has a normal mood and affect. Her behavior is normal. Judgment and thought content normal. Her mood appears not anxious. She does not exhibit a depressed mood.   Results for orders placed or performed in visit on 09/02/15  Hemoglobin A1C  Result Value Ref Range   Hgb A1c MFr Bld 6.2 (H) <5.7 %   Mean Plasma Glucose 131 mg/dL  BASIC METABOLIC PANEL WITH GFR  Result Value Ref Range   Sodium 139 135 - 146 mmol/L   Potassium 3.8 3.5 - 5.3 mmol/L   Chloride 105 98 - 110 mmol/L   CO2 25 20 - 31 mmol/L   Glucose, Bld 88 65 - 99 mg/dL   BUN 22 7 - 25 mg/dL   Creat 0.96 (H) 0.60 - 0.88 mg/dL   Calcium 9.3 8.6 - 10.4 mg/dL   GFR, Est African American 64 >=60 mL/min   GFR, Est Non African American 55 (L) >=60 mL/min  CBC with Differential/Platelet  Result Value Ref Range   WBC 6.3 3.8 - 10.8 K/uL   RBC 4.36 3.80 - 5.10 MIL/uL   Hemoglobin 11.5 (L) 11.7 - 15.5 g/dL   HCT 35.7 35.0 - 45.0 %   MCV 81.9 80.0 - 100.0 fL   MCH 26.4 (L) 27.0 - 33.0 pg   MCHC 32.2 32.0 - 36.0 g/dL   RDW 14.4 11.0 - 15.0 %   Platelets 181 140 - 400 K/uL   MPV 10.3 7.5 - 12.5 fL   Neutro Abs 2,520 1,500 - 7,800 cells/uL   Lymphs Abs 3,087 850 - 3,900 cells/uL   Monocytes Absolute 378 200 - 950 cells/uL   Eosinophils Absolute 252 15 - 500 cells/uL   Basophils Absolute 63 0 - 200 cells/uL   Neutrophils Relative % 40 %   Lymphocytes Relative 49 %   Monocytes Relative 6 %   Eosinophils Relative 4 %   Basophils Relative 1 %   Smear Review Criteria for review not met   Ferritin  Result Value Ref Range   Ferritin 54 20 - 288 ng/mL      Assessment & Plan:    Problem List Items Addressed  This Visit      Digestive   Gastroesophageal reflux disease without esophagitis    Doing well on H2 blocker        Musculoskeletal and Integument   Arthritis of both knees    Significant arthritis in both knees; using hydrocodone just once a day      Relevant Medications   HYDROcodone-acetaminophen (NORCO/VICODIN) 5-325 MG tablet     Other   Prediabetes    Check A1c, glucose; try to limit white bread, biscuits      Relevant Orders   Hemoglobin A1c   Obesity (BMI 30.0-34.9)    Encouraged modest weight loss      Medication monitoring encounter    Check labs today      Relevant Orders   COMPLETE METABOLIC PANEL WITH GFR   HLD (hyperlipidemia)    Continue statin; check lipids today; work on modest weight loss      Relevant Medications   cloNIDine (CATAPRES) 0.1 MG tablet   Olmesartan-Amlodipine-HCTZ 40-10-25 MG TABS   Other Relevant Orders   Lipid panel   Controlled substance agreement signed    [place] web site reviewed over the last year; will refill medicine; new contract today; UDS per routine          Follow up plan: Return in about 3 months (around 10/07/2016) for twenty minute follow-up with fasting labs; ONE WEEK with CMA for blood pressure and pulse.  An after-visit summary was printed and given to the patient at [place] [facility].  Please see the patient instructions which may contain other information and recommendations beyond what is mentioned above in the assessment and plan.  Meds ordered this encounter  Medications  . DISCONTD: Olmesartan-Amlodipine-HCTZ 40-10-25 MG TABS    Sig: Take 1 tablet by mouth daily.  Marland Kitchen DISCONTD: HYDROcodone-acetaminophen (NORCO/VICODIN) 5-325 MG tablet    Sig: Take 1 tablet by mouth every 8 (eight) hours as needed.  . Multiple Vitamins-Minerals (CENTRUM SILVER 50+WOMEN) TABS    Sig: Take 1 tablet by mouth daily.  . cloNIDine (CATAPRES) 0.1 MG tablet    Sig: Take 1 tablet (0.1 mg total) by mouth  every 8 (eight) hours.    Dispense:  270 tablet    Refill:  3    New instructions  . Olmesartan-Amlodipine-HCTZ 40-10-25 MG TABS    Sig: Take 1 tablet by mouth daily.    Dispense:  90 tablet    Refill:  3  . HYDROcodone-acetaminophen (NORCO/VICODIN) 5-325 MG tablet    Sig: Take 1 tablet by mouth daily.    Dispense:  30 tablet    Refill:  0    Controlled substance contract; CHRONIC pain; [place] web site reviewed x 12 months    Orders Placed This Encounter  Procedures  . Hemoglobin A1c  . Lipid panel  . COMPLETE METABOLIC PANEL WITH GFR

## 2016-07-07 NOTE — Assessment & Plan Note (Signed)
Encouraged modest weight loss 

## 2016-07-07 NOTE — Assessment & Plan Note (Signed)
Check A1c, glucose; try to limit white bread, biscuits

## 2016-07-07 NOTE — Assessment & Plan Note (Signed)
Significant arthritis in both knees; using hydrocodone just once a day

## 2016-07-07 NOTE — Assessment & Plan Note (Signed)
Check labs today.

## 2016-07-07 NOTE — Assessment & Plan Note (Signed)
Doing well on H2 blocker

## 2016-07-08 LAB — COMPLETE METABOLIC PANEL WITH GFR
ALBUMIN: 4.1 g/dL (ref 3.6–5.1)
ALK PHOS: 49 U/L (ref 33–130)
ALT: 8 U/L (ref 6–29)
AST: 16 U/L (ref 10–35)
BUN: 14 mg/dL (ref 7–25)
CO2: 25 mmol/L (ref 20–31)
Calcium: 9.3 mg/dL (ref 8.6–10.4)
Chloride: 104 mmol/L (ref 98–110)
Creat: 0.7 mg/dL (ref 0.60–0.88)
GFR, EST NON AFRICAN AMERICAN: 80 mL/min (ref >=60)
Glucose, Bld: 92 mg/dL (ref 65–99)
POTASSIUM: 3.8 mmol/L (ref 3.5–5.3)
Sodium: 139 mmol/L (ref 135–146)
Total Bilirubin: 0.6 mg/dL (ref 0.2–1.2)
Total Protein: 7.4 g/dL (ref 6.1–8.1)

## 2016-07-08 LAB — LIPID PANEL
Cholesterol: 218 mg/dL — ABNORMAL HIGH (ref <200)
HDL: 57 mg/dL (ref >50)
LDL Cholesterol: 143 mg/dL — ABNORMAL HIGH (ref <100)
TRIGLYCERIDES: 89 mg/dL (ref <150)
Total CHOL/HDL Ratio: 3.8 Ratio (ref <5.0)
VLDL: 18 mg/dL (ref <30)

## 2016-07-08 LAB — HEMOGLOBIN A1C
HEMOGLOBIN A1C: 6.2 % — AB (ref <5.7)
MEAN PLASMA GLUCOSE: 131 mg/dL

## 2016-07-11 ENCOUNTER — Encounter: Payer: Self-pay | Admitting: Family Medicine

## 2016-07-14 ENCOUNTER — Ambulatory Visit: Payer: Medicare Other

## 2016-07-14 ENCOUNTER — Other Ambulatory Visit: Payer: Self-pay | Admitting: Family Medicine

## 2016-07-14 ENCOUNTER — Telehealth: Payer: Self-pay

## 2016-07-14 VITALS — BP 122/58 | HR 60

## 2016-07-14 DIAGNOSIS — Z5181 Encounter for therapeutic drug level monitoring: Secondary | ICD-10-CM

## 2016-07-14 DIAGNOSIS — I10 Essential (primary) hypertension: Secondary | ICD-10-CM

## 2016-07-14 DIAGNOSIS — E782 Mixed hyperlipidemia: Secondary | ICD-10-CM

## 2016-07-14 MED ORDER — HYDROCODONE-ACETAMINOPHEN 5-325 MG PO TABS: 1.0000 | ORAL_TABLET | Freq: Every day | ORAL | 0 refills | Status: DC

## 2016-07-14 MED ORDER — ATORVASTATIN CALCIUM 40 MG PO TABS: 40.0000 mg | ORAL_TABLET | Freq: Every day | ORAL | 1 refills | Status: DC

## 2016-07-14 NOTE — Progress Notes (Signed)
Switching statin

## 2016-07-14 NOTE — Progress Notes (Signed)
Patient apparently did not get Rx at last visit Consistent UDS New Rx given and will check [place] web site carefully at next office visit

## 2016-07-14 NOTE — Telephone Encounter (Signed)
Pt stated she never received her printed out RX for Vicodin at her last visit; Was not able to look in the shred ben; the company already came for the ben. Called pt pharmacy and they do not have it. Spoke to Dr. Sanda Klein. New RX was given.

## 2016-08-10 ENCOUNTER — Other Ambulatory Visit: Payer: Self-pay

## 2016-08-10 NOTE — Telephone Encounter (Signed)
Rx request received for statin Too soon I just prescribed 30 + 1 refill on 07/14/16, should last until mid-August She'll be due for labs in late July before she needs refills or dose adjustment Please call her and clarify she's taking it correctly and understands plan

## 2016-08-10 NOTE — Telephone Encounter (Signed)
Sorry her ins wants her to get 90 day she is not out yet

## 2016-08-10 NOTE — Telephone Encounter (Signed)
Ins requesting 90 day supply 

## 2016-08-11 NOTE — Telephone Encounter (Signed)
I'm not going to send a 90 day supply quite yet, as the dose may need to be adjusted; we'll be able to do a 90 day supply once her labs are back Thank you

## 2016-08-24 ENCOUNTER — Other Ambulatory Visit: Payer: Self-pay | Admitting: Family Medicine

## 2016-08-24 MED ORDER — HYDROCODONE-ACETAMINOPHEN 5-325 MG PO TABS: 1.0000 | ORAL_TABLET | Freq: Every day | ORAL | 0 refills | Status: DC

## 2016-08-24 NOTE — Telephone Encounter (Signed)
Requesting refill on hydrocodone-ace. Took last pill this morning

## 2016-08-24 NOTE — Telephone Encounter (Signed)
[place] web site reviewed last 12 months Last UDS reviewed Rx approved

## 2016-09-19 ENCOUNTER — Other Ambulatory Visit: Payer: Self-pay | Admitting: Family Medicine

## 2016-09-19 NOTE — Telephone Encounter (Signed)
Please remind patient that she was due to have her cholesterol recheck a few weeks ago Ask her to come in ASAP fasting for labs I'll approve five days to give her time to get in here, but we need to make sure the dose is appropriate Thank you

## 2016-09-19 NOTE — Telephone Encounter (Signed)
Patient notified

## 2016-09-30 ENCOUNTER — Telehealth: Payer: Self-pay | Admitting: Family Medicine

## 2016-09-30 MED ORDER — HYDROCODONE-ACETAMINOPHEN 5-325 MG PO TABS: 1.0000 | ORAL_TABLET | Freq: Every day | ORAL | 0 refills | Status: DC

## 2016-09-30 NOTE — Telephone Encounter (Signed)
Patient may need a refill of hydrocodone before I return from vacation [employer] site reviewed Rx printed, ready in case she needs it

## 2016-10-06 ENCOUNTER — Other Ambulatory Visit: Payer: Self-pay | Admitting: Family Medicine

## 2016-10-21 ENCOUNTER — Ambulatory Visit: Payer: Medicare Other | Admitting: Family Medicine

## 2016-10-24 ENCOUNTER — Other Ambulatory Visit: Payer: Self-pay | Admitting: Family Medicine

## 2016-10-24 NOTE — Telephone Encounter (Signed)
Pt notiefied

## 2016-10-24 NOTE — Telephone Encounter (Signed)
Rx for pain medicine denied She needs an appointment for this She had one for Sept 21st and canceled it Thank you

## 2016-10-24 NOTE — Telephone Encounter (Signed)
Pt states she needs refill on pain meds.

## 2016-10-24 NOTE — Telephone Encounter (Signed)
completed

## 2016-10-28 ENCOUNTER — Telehealth: Payer: Self-pay | Admitting: Family Medicine

## 2016-10-28 NOTE — Telephone Encounter (Signed)
Pt has scheduled an appt for 11-02-16, however she is completely out of hydrocodone and all the other medications. She does not wish to take lipitor. She is asking that you send in enough to last until she is able to get in. Stated that she missed her appt because her daughter was not able to bring her

## 2016-11-01 NOTE — Telephone Encounter (Signed)
Pt has moved her appointment up to 11/03/16.

## 2016-11-01 NOTE — Telephone Encounter (Signed)
Patient will get her Rx for the pain medicine at her appointment She should not need refills of anything else; prescription history shows that they should all be current Thank you

## 2016-11-03 ENCOUNTER — Encounter: Payer: Self-pay | Admitting: Family Medicine

## 2016-11-03 ENCOUNTER — Ambulatory Visit (INDEPENDENT_AMBULATORY_CARE_PROVIDER_SITE_OTHER): Payer: Medicare Other | Admitting: Family Medicine

## 2016-11-03 VITALS — BP 124/56 | HR 78 | Temp 97.7°F | Resp 14 | Wt 172.8 lb

## 2016-11-03 DIAGNOSIS — M5136 Other intervertebral disc degeneration, lumbar region: Secondary | ICD-10-CM

## 2016-11-03 DIAGNOSIS — M4126 Other idiopathic scoliosis, lumbar region: Secondary | ICD-10-CM | POA: Diagnosis not present

## 2016-11-03 DIAGNOSIS — M419 Scoliosis, unspecified: Secondary | ICD-10-CM | POA: Insufficient documentation

## 2016-11-03 DIAGNOSIS — Z5181 Encounter for therapeutic drug level monitoring: Secondary | ICD-10-CM

## 2016-11-03 DIAGNOSIS — M51369 Other intervertebral disc degeneration, lumbar region without mention of lumbar back pain or lower extremity pain: Secondary | ICD-10-CM

## 2016-11-03 DIAGNOSIS — M170 Bilateral primary osteoarthritis of knee: Secondary | ICD-10-CM

## 2016-11-03 DIAGNOSIS — M153 Secondary multiple arthritis: Secondary | ICD-10-CM | POA: Diagnosis not present

## 2016-11-03 DIAGNOSIS — E782 Mixed hyperlipidemia: Secondary | ICD-10-CM

## 2016-11-03 DIAGNOSIS — Z23 Encounter for immunization: Secondary | ICD-10-CM

## 2016-11-03 LAB — LIPID PANEL
CHOL/HDL RATIO: 3.2 (calc) (ref <5.0)
CHOLESTEROL: 184 mg/dL (ref <200)
HDL: 58 mg/dL (ref >50)
LDL CHOLESTEROL (CALC): 109 mg/dL — AB
Non-HDL Cholesterol (Calc): 126 mg/dL (calc) (ref <130)
Triglycerides: 80 mg/dL (ref <150)

## 2016-11-03 LAB — ALT: ALT: 9 U/L (ref 6–29)

## 2016-11-03 MED ORDER — DICLOFENAC SODIUM 1 % TD GEL: 4.0000 g | Freq: Four times a day (QID) | TRANSDERMAL | 5 refills | Status: DC | PRN

## 2016-11-03 MED ORDER — HYDROCODONE-ACETAMINOPHEN 5-325 MG PO TABS: 1.0000 | ORAL_TABLET | Freq: Two times a day (BID) | ORAL | 0 refills | Status: DC | PRN

## 2016-11-03 MED ORDER — GABAPENTIN 100 MG PO CAPS: 100.0000 mg | ORAL_CAPSULE | Freq: Every day | ORAL | 3 refills | Status: DC

## 2016-11-03 MED ORDER — ATORVASTATIN CALCIUM 40 MG PO TABS: 40.0000 mg | ORAL_TABLET | Freq: Every day | ORAL | 1 refills | Status: DC

## 2016-11-03 NOTE — Progress Notes (Signed)
BP (!) 124/56 (BP Location: Left Arm, Patient Position: Sitting, Cuff Size: Normal)   Pulse 78   Temp 97.7 F (36.5 C) (Oral)   Resp 14   Wt 172 lb 12.8 oz (78.4 kg)   SpO2 96%   BMI 33.75 kg/m    Subjective:    Patient ID: Jocelyn [name], female    DOB: [birth year], 83 y.o.   MRN: [id number]  HPI: Jocelyn [name] is a 83 y.o. female  Chief Complaint  Patient presents with  . Medication Refill    pain medicine; pt would also like to be put back on pravastatin.    HPI She has had back problems for many years She has been out of pain medicine for a few days She almost fell years ago, grabbed the rail and twisted around, ; back problems ever since then Had surgery on her lumbar disc She asked if a back brace would help She has not done PT for years Has to use a walker Last xrays were 2016, marked scoliosis, degenerative changes FINDINGS: There is marked levoscoliosis of the lumbar spine centered at the L2-3 level. There are extensive degenerative changes throughout the lumbar spine with associated disc space narrowings and osteophyte formation.  No acute-appearing cortical irregularity or osseous lesions seen. No fracture line or displaced fracture fragment. Atherosclerotic changes are seen along the walls of the grossly normal-caliber infrarenal abdominal aorta. Paravertebral soft tissues otherwise unremarkable.  IMPRESSION: Marked scoliosis.  Extensive degenerative changes throughout the lumbar spine, most prominent at the mid and lower levels.  No acute findings.   Electronically Signed   By: Franki Cabot M.D.   On: 08/19/2014 14:03 No new or sudden pain; quality and location of pain is not different Some bladder issues, but no B/B dysfunction (just a little leakage at times) Pain level right now is 8 out of 10  She also had pain in her knees; saw a specialist and got a shot in her knees; would like to go back  She has high cholesterol and  would like to go back on statin; she used to be on 40 mg lipitor; ready to start back Lab Results  Component Value Date   CHOL 218 (H) 07/07/2016   HDL 57 07/07/2016   LDLCALC 143 (H) 07/07/2016   TRIG 89 07/07/2016   CHOLHDL 3.8 07/07/2016   Depression screen PHQ 2/9 11/03/2016 07/07/2016 02/29/2016 12/10/2015 07/08/2015  Decreased Interest 0 0 0 0 0  Down, Depressed, Hopeless 0 0 1 0 0  PHQ - 2 Score 0 0 1 0 0   Relevant past medical, surgical, family and social history reviewed Past Medical History:  Diagnosis Date  . Arthritis of both knees 08/01/2015  . Essential hypertension, benign 07/28/2014  . GERD (gastroesophageal reflux disease)   . Hx of iron deficiency anemia 09/02/2015  . Hyperlipidemia   . Hypertension   . Lumbar pain   . OSA on CPAP 08/01/2015  . Overweight (BMI 25.0-29.9) 07/10/2013  . Prediabetes 09/02/2015   Past Surgical History:  Procedure Laterality Date  . ABDOMINAL HYSTERECTOMY    . CATARACT EXTRACTION    . CHOLECYSTECTOMY    . LUMBAR DISC SURGERY     Family History  Problem Relation Age of Onset  . Hypertension Daughter   . Diabetes Daughter   . Diabetes Mother   . Heart disease Mother   . Cancer Father        lung; smoker   Social History  Social History  . Marital status: Married    Spouse name: N/A  . Number of children: N/A  . Years of education: N/A   Occupational History  . Not on file.   Social History Main Topics  . Smoking status: Never Smoker  . Smokeless tobacco: Never Used  . Alcohol use No  . Drug use: No  . Sexual activity: Not Currently   Other Topics Concern  . Not on file   Social History Narrative  . No narrative on file    Interim medical history since last visit reviewed. Allergies and medications reviewed  Review of Systems Per HPI unless specifically indicated above     Objective:    BP (!) 124/56 (BP Location: Left Arm, Patient Position: Sitting, Cuff Size: Normal)   Pulse 78   Temp 97.7 F (36.5 C)  (Oral)   Resp 14   Wt 172 lb 12.8 oz (78.4 kg)   SpO2 96%   BMI 33.75 kg/m   Wt Readings from Last 3 Encounters:  11/03/16 172 lb 12.8 oz (78.4 kg)  07/07/16 170 lb 6.4 oz (77.3 kg)  02/29/16 169 lb 12.8 oz (77 kg)    Physical Exam  Constitutional: She appears well-developed and well-nourished. No distress.  Weight gain two pounds; obese  HENT:  Head: Normocephalic and atraumatic.  Eyes: EOM are normal. No scleral icterus.  Neck: No thyromegaly present.  Cardiovascular: Normal rate, regular rhythm and normal heart sounds.   Pulmonary/Chest: Effort normal and breath sounds normal.  Abdominal: Soft. Normal appearance and bowel sounds are normal. She exhibits no distension.  Musculoskeletal: She exhibits no edema.       Right knee: She exhibits swelling and deformity. She exhibits no effusion.       Left knee: She exhibits swelling and deformity.       Lumbar back: She exhibits decreased range of motion, tenderness (lumbar region) and deformity (scoliosis).  Neurological: She is alert. She displays no tremor.  Gait not assessed; using a walker today  Skin: Skin is warm and dry. She is not diaphoretic. No pallor.  Psychiatric: She has a normal mood and affect. Her behavior is normal. Judgment and thought content normal. Her mood appears not anxious. She does not exhibit a depressed mood.   Results for orders placed or performed in visit on 07/07/16  Hemoglobin A1c  Result Value Ref Range   Hgb A1c MFr Bld 6.2 (H) <5.7 %   Mean Plasma Glucose 131 mg/dL  Lipid panel  Result Value Ref Range   Cholesterol 218 (H) <200 mg/dL   Triglycerides 89 <150 mg/dL   HDL 57 >50 mg/dL   Total CHOL/HDL Ratio 3.8 <5.0 Ratio   VLDL 18 <30 mg/dL   LDL Cholesterol 143 (H) <100 mg/dL  COMPLETE METABOLIC PANEL WITH GFR  Result Value Ref Range   Sodium 139 135 - 146 mmol/L   Potassium 3.8 3.5 - 5.3 mmol/L   Chloride 104 98 - 110 mmol/L   CO2 25 20 - 31 mmol/L   Glucose, Bld 92 65 - 99 mg/dL    BUN 14 7 - 25 mg/dL   Creat 0.70 0.60 - 0.88 mg/dL   Total Bilirubin 0.6 0.2 - 1.2 mg/dL   Alkaline Phosphatase 49 33 - 130 U/L   AST 16 10 - 35 U/L   ALT 8 6 - 29 U/L   Total Protein 7.4 6.1 - 8.1 g/dL   Albumin 4.1 3.6 - 5.1 g/dL   Calcium 9.3  8.6 - 10.4 mg/dL   GFR, Est African American >89 >=60 mL/min   GFR, Est Non African American 80 >=60 mL/min      Assessment & Plan:   Problem List Items Addressed This Visit      Musculoskeletal and Integument   Lumbar scoliosis    Refer to pain clinic      Relevant Orders   Ambulatory referral to Pain Clinic   DDD (degenerative disc disease), lumbar - Primary    Refer to back specialist; add gabapentin; add the voltaren gel back to her regimen      Relevant Medications   HYDROcodone-acetaminophen (NORCO/VICODIN) 5-325 MG tablet   Other Relevant Orders   Ambulatory referral to Pain Clinic   Arthritis of both knees    Refer to orthopaedist, start back on voltaren gel      Relevant Medications   HYDROcodone-acetaminophen (NORCO/VICODIN) 5-325 MG tablet   Other Relevant Orders   Ambulatory referral to Pain Clinic   Ambulatory referral to Orthopedic Surgery     Other   Medication monitoring encounter   HLD (hyperlipidemia)    She wishes to start back on cholesterol medicine; try to eat fewer saturated fats      Relevant Medications   atorvastatin (LIPITOR) 40 MG tablet    Other Visit Diagnoses    Needs flu shot       Relevant Orders   Flu vaccine HIGH DOSE PF (Fluzone High dose) (Completed)   Secondary osteoarthritis of multiple sites       Relevant Medications   diclofenac sodium (VOLTAREN) 1 % GEL   HYDROcodone-acetaminophen (NORCO/VICODIN) 5-325 MG tablet   Other Relevant Orders   Ambulatory referral to Pain Clinic   Ambulatory referral to Orthopedic Surgery      Follow up plan: Return in about 3 months (around 02/03/2017) for follow-up visit with Dr. Sanda Klein.  An after-visit summary was printed and given to the  patient at [place].  Please see the patient instructions which may contain other information and recommendations beyond what is mentioned above in the assessment and plan.  Meds ordered this encounter  Medications  . gabapentin (NEURONTIN) 100 MG capsule    Sig: Take 1 capsule (100 mg total) by mouth at bedtime.    Dispense:  90 capsule    Refill:  3  . atorvastatin (LIPITOR) 40 MG tablet    Sig: Take 1 tablet (40 mg total) by mouth at bedtime.    Dispense:  30 tablet    Refill:  1  . diclofenac sodium (VOLTAREN) 1 % GEL    Sig: Apply 4 g topically 4 (four) times daily as needed. On the knees    Dispense:  100 g    Refill:  5  . HYDROcodone-acetaminophen (NORCO/VICODIN) 5-325 MG tablet    Sig: Take 1 tablet by mouth 2 (two) times daily as needed for moderate pain.    Dispense:  60 tablet    Refill:  0    Controlled substance contract; CHRONIC pain    Orders Placed This Encounter  Procedures  . Flu vaccine HIGH DOSE PF (Fluzone High dose)  . Ambulatory referral to Pain Clinic  . Ambulatory referral to Orthopedic Surgery   I was unable to access the [facility] registry today; I left a phone message and requested a return call prior to seeing the patient

## 2016-11-03 NOTE — Assessment & Plan Note (Signed)
Refer to back specialist; add gabapentin; add the voltaren gel back to her regimen

## 2016-11-03 NOTE — Assessment & Plan Note (Signed)
Refer to pain clinic? 

## 2016-11-03 NOTE — Assessment & Plan Note (Signed)
She wishes to start back on cholesterol medicine; try to eat fewer saturated fats

## 2016-11-03 NOTE — Patient Instructions (Addendum)
Start back on the cholesterol medicine Start back on voltaren (diclofenac) gel for your knees Start the gabapentin at night to help with pain Use the hydrocodone when needed for pain per directions We'll have you see the pain clinic and the orthopaedist Return in 6-8 weeks for fasting cholesterol Try turmeric as a natural anti-inflammatory (for pain and arthritis). It comes in capsules where you buy aspirin and fish oil, but also as a spice where you buy pepper and garlic powder. You can use 500 mg of tylenol twice a day alternating with the hydrocodone pill for additional relief

## 2016-11-03 NOTE — Assessment & Plan Note (Signed)
Refer to orthopaedist, start back on voltaren gel

## 2016-11-07 ENCOUNTER — Ambulatory Visit: Payer: Medicare Other | Admitting: Family Medicine

## 2016-11-28 ENCOUNTER — Other Ambulatory Visit: Payer: Self-pay

## 2016-11-28 NOTE — Telephone Encounter (Signed)
Ins requesting 90 day 

## 2016-11-29 ENCOUNTER — Telehealth: Payer: Self-pay | Admitting: Family Medicine

## 2016-11-29 MED ORDER — ATORVASTATIN CALCIUM 40 MG PO TABS: 40.0000 mg | ORAL_TABLET | Freq: Every day | ORAL | 3 refills | Status: DC

## 2016-11-29 NOTE — Telephone Encounter (Signed)
Oct labs reviewed

## 2016-11-29 NOTE — Telephone Encounter (Signed)
Copied from [place] #[id number]. Topic: Inquiry >> Nov 29, 2016 11:02 AM Oliver Pila B wrote: Reason for CRM: PT called to let the Dr. Gwyndolyn Kaufman that she wants to stop taking Lipitor and wants to go back to taking Gabapentin and she would also like some Zantac for heart burn

## 2016-11-30 ENCOUNTER — Other Ambulatory Visit: Payer: Self-pay | Admitting: Emergency Medicine

## 2016-11-30 MED ORDER — RANITIDINE HCL 150 MG PO TABS: 150.0000 mg | ORAL_TABLET | Freq: Two times a day (BID) | ORAL | 11 refills | Status: DC

## 2016-11-30 NOTE — Telephone Encounter (Signed)
Patient notified that Zantac was called to her pharmacy. Would like you to advise her tomorrow on Gabapentin and Lipitor. Would like to know if she can go back on what she was taking previous.

## 2016-12-01 NOTE — Telephone Encounter (Signed)
She has gabapentin in her med list and I just prescribed that in early October with a year of refills, so I don't understand what she is asking for the gabapentin In regards to the Lipitor (atorvastatin), please find out why she wants to go back on what she took before (pravastatin)

## 2016-12-01 NOTE — Telephone Encounter (Signed)
Called left detailed voicemail to call back because I did not understand her message?

## 2016-12-23 ENCOUNTER — Other Ambulatory Visit: Payer: Self-pay

## 2016-12-23 NOTE — Telephone Encounter (Signed)
Pt. Called requesting refill of her Hydrocodone. Call pt. When ready.

## 2016-12-26 MED ORDER — HYDROCODONE-ACETAMINOPHEN 5-325 MG PO TABS: 1.0000 | ORAL_TABLET | Freq: Two times a day (BID) | ORAL | 0 refills | Status: DC | PRN

## 2016-12-26 NOTE — Telephone Encounter (Signed)
Reviewed [place] web site Rx ready for pick-up

## 2016-12-26 NOTE — Addendum Note (Signed)
Addended by: Docia Furl on: 12/26/2016 09:13 AM   Modules accepted: Orders

## 2016-12-26 NOTE — Addendum Note (Signed)
Addended by: Cornelius Marullo, Satira Anis on: 12/26/2016 09:46 AM   Modules accepted: Orders

## 2017-01-11 ENCOUNTER — Other Ambulatory Visit: Payer: Self-pay | Admitting: Family Medicine

## 2017-01-11 NOTE — Telephone Encounter (Signed)
Potassium is not in her current med list Please contact pharmacy to see if this is something she's been taking all along We'll get labs at her next visit in a few weeks

## 2017-02-03 ENCOUNTER — Ambulatory Visit: Payer: Medicare Other | Admitting: Family Medicine

## 2017-02-03 ENCOUNTER — Encounter: Payer: Self-pay | Admitting: Family Medicine

## 2017-02-03 ENCOUNTER — Telehealth: Payer: Self-pay

## 2017-02-03 VITALS — BP 122/60 | HR 85 | Temp 98.5°F | Resp 14 | Wt 165.8 lb

## 2017-02-03 DIAGNOSIS — M5136 Other intervertebral disc degeneration, lumbar region: Secondary | ICD-10-CM

## 2017-02-03 DIAGNOSIS — Z79899 Other long term (current) drug therapy: Secondary | ICD-10-CM | POA: Diagnosis not present

## 2017-02-03 DIAGNOSIS — L989 Disorder of the skin and subcutaneous tissue, unspecified: Secondary | ICD-10-CM

## 2017-02-03 DIAGNOSIS — E782 Mixed hyperlipidemia: Secondary | ICD-10-CM | POA: Diagnosis not present

## 2017-02-03 DIAGNOSIS — Z5181 Encounter for therapeutic drug level monitoring: Secondary | ICD-10-CM | POA: Diagnosis not present

## 2017-02-03 DIAGNOSIS — M4126 Other idiopathic scoliosis, lumbar region: Secondary | ICD-10-CM | POA: Diagnosis not present

## 2017-02-03 DIAGNOSIS — G4733 Obstructive sleep apnea (adult) (pediatric): Secondary | ICD-10-CM

## 2017-02-03 DIAGNOSIS — R634 Abnormal weight loss: Secondary | ICD-10-CM

## 2017-02-03 DIAGNOSIS — I10 Essential (primary) hypertension: Secondary | ICD-10-CM | POA: Diagnosis not present

## 2017-02-03 DIAGNOSIS — Z9989 Dependence on other enabling machines and devices: Secondary | ICD-10-CM

## 2017-02-03 DIAGNOSIS — I209 Angina pectoris, unspecified: Secondary | ICD-10-CM | POA: Diagnosis not present

## 2017-02-03 DIAGNOSIS — M170 Bilateral primary osteoarthritis of knee: Secondary | ICD-10-CM

## 2017-02-03 DIAGNOSIS — R7303 Prediabetes: Secondary | ICD-10-CM

## 2017-02-03 DIAGNOSIS — M51369 Other intervertebral disc degeneration, lumbar region without mention of lumbar back pain or lower extremity pain: Secondary | ICD-10-CM

## 2017-02-03 MED ORDER — HYDROCODONE-ACETAMINOPHEN 5-325 MG PO TABS: 1.0000 | ORAL_TABLET | Freq: Two times a day (BID) | ORAL | 0 refills | Status: DC | PRN

## 2017-02-03 NOTE — Progress Notes (Signed)
BP 122/60   Pulse 85   Temp 98.5 F (36.9 C) (Oral)   Resp 14   Wt 165 lb 12.8 oz (75.2 kg)   SpO2 98%   BMI 32.38 kg/m    Subjective:    Patient ID: Jocelyn [name], female    DOB: 1931/05/16, 84 y.o.   MRN: [id number]  HPI: Jocelyn [name] is a 84 y.o. female  Chief Complaint  Patient presents with  . Follow-up    HPI Patient is here for f/u  She has hypertension; well-controlled today; on clonidine, olmesartan, HCTZ, and imdur  She has prediabetes Lab Results  Component Value Date   HGBA1C 6.2 (H) 07/07/2016   She has high cholesterol; atorvastatin 40 mg; eats a little fatty meats, does not eliminate completely; does eat oatmeal sometimes Previous total was 218, down to 184 in October; her LDL came down from 143 to 109 in October  Chronic pain; last UDS was June 2018; controlled substance agreement UTD June 2018; arthritis in her knees is "bad"; using walker (two-wheel rolling); uses out and about and at home too; [facility] Dr. Holley Raring approved her in October but they were unable to contact her per the referral note The pain medicine does not make her feel drunk or high; no problems with constipation  She has coronary artery disease; on imdur, seeing cardiologist, Dr. Clayborn Bigness; no chest pain, medicine helps Last stress test I can see was 2012: IMPRESSION:  1.Adequate Lexiscan study, non-diagnostic EKG because of baseline  abnormality. No significant evidence of reversible ischemia with  borderline  apical defect with borderline redistribution. Overall ejection fraction  was  44% with globally depressed mild apical hypokinesis. Would recommend  medical  therapy for this patient at this point. She may have coronary artery  disease  in the apical distribution but medical therapy at this point would be most  indicated. Would not consider further evaluation unless the patient's  symptoms persist, worsen or recur.   She has an  itchy spot on the back of her left arm; she tried a cream when it gets to itching; offered dermatologist  She has sleep apnea; has not had a sleep study for 15-20 years, no one has adjusted her machine; she quit using it, didn't think it was doing her any good   Depression screen [facility] 2/9 02/03/2017 11/03/2016 07/07/2016 02/29/2016 12/10/2015  Decreased Interest 0 0 0 0 0  Down, Depressed, Hopeless 1 0 0 1 0  PHQ - 2 Score 1 0 0 1 0    Relevant past medical, surgical, family and social history reviewed Past Medical History:  Diagnosis Date  . Arthritis of both knees 08/01/2015  . Essential hypertension, benign 07/28/2014  . GERD (gastroesophageal reflux disease)   . Hx of iron deficiency anemia 09/02/2015  . Hyperlipidemia   . Hypertension   . Lumbar pain   . OSA on CPAP 08/01/2015  . Overweight (BMI 25.0-29.9) 07/10/2013  . Prediabetes 09/02/2015   Past Surgical History:  Procedure Laterality Date  . ABDOMINAL HYSTERECTOMY    . CATARACT EXTRACTION    . CHOLECYSTECTOMY    . LUMBAR DISC SURGERY     Family History  Problem Relation Age of Onset  . Hypertension Daughter   . Diabetes Daughter   . Diabetes Mother   . Heart disease Mother   . Cancer Father        lung; smoker   Social History  Tobacco Use  . Smoking status: Never Smoker  . Smokeless tobacco: Never Used  Substance Use Topics  . Alcohol use: No    Alcohol/week: 0.0 oz  . Drug use: No    Interim medical history since last visit reviewed. Allergies and medications reviewed  Review of Systems Per HPI unless specifically indicated above     Objective:    BP 122/60   Pulse 85   Temp 98.5 F (36.9 C) (Oral)   Resp 14   Wt 165 lb 12.8 oz (75.2 kg)   SpO2 98%   BMI 32.38 kg/m   Wt Readings from Last 3 Encounters:  02/03/17 165 lb 12.8 oz (75.2 kg)  11/03/16 172 lb 12.8 oz (78.4 kg)  07/07/16 170 lb 6.4 oz (77.3 kg)    Physical Exam  Constitutional: She appears well-developed and well-nourished. No distress.    Weight back down 7 pounds; obese  HENT:  Head: Normocephalic and atraumatic.  Eyes: EOM are normal. No scleral icterus.  Neck: No thyromegaly present.  Cardiovascular: Normal rate, regular rhythm and normal heart sounds.  Pulmonary/Chest: Effort normal and breath sounds normal.  Abdominal: Soft. Normal appearance and bowel sounds are normal. She exhibits no distension.  Musculoskeletal: She exhibits no edema.       Right knee: She exhibits swelling and deformity. She exhibits no effusion.       Left knee: She exhibits swelling and deformity.       Lumbar back: She exhibits decreased range of motion, tenderness (lumbar region) and deformity (scoliosis).  Tender left of the midline  Neurological: She is alert. She displays no tremor.  Gait not assessed; using a walker today  Skin: Skin is warm and dry. She is not diaphoretic. No pallor.  Keratotic lesion posterior LEFT arm, nontender  Psychiatric: She has a normal mood and affect. Her behavior is normal. Judgment and thought content normal. Her mood appears not anxious. She does not exhibit a depressed mood.    Results for orders placed or performed in visit on 11/03/16  Lipid panel  Result Value Ref Range   Cholesterol 184 <200 mg/dL   HDL 58 >50 mg/dL   Triglycerides 80 <150 mg/dL   LDL Cholesterol (Calc) 109 (H) mg/dL (calc)   Total CHOL/HDL Ratio 3.2 <5.0 (calc)   Non-HDL Cholesterol (Calc) 126 <130 mg/dL (calc)  ALT  Result Value Ref Range   ALT 9 6 - 29 U/L      Assessment & Plan:   Problem List Items Addressed This Visit      Cardiovascular and Mediastinum   Essential hypertension, benign - Primary    Controlled; work on weight loss; DASH guidelines      Angina pectoris ([place])    Continue long-acting nitrate; refer back to cardiologist, Dr. Clayborn Bigness      Relevant Orders   Ambulatory referral to Cardiology     Respiratory   OSA on CPAP    Refer to Dr. Ashby Dawes      Relevant Orders   Ambulatory referral  to Pulmonology     Musculoskeletal and Integument   Lumbar scoliosis    Referred to pain clinic      DDD (degenerative disc disease), lumbar    Referred to pain clinic      Relevant Medications   HYDROcodone-acetaminophen (NORCO/VICODIN) 5-325 MG tablet   Arthritis of both knees    Continue pain management with hydrocodone; using walker; Dr. Holley Raring has agreed to see her to help manage her chronic  pain; one month of Rx given today; asking patient to contact his office to schedule an appointment      Relevant Medications   HYDROcodone-acetaminophen (NORCO/VICODIN) 5-325 MG tablet     Other   Prediabetes    Check A1c and glucose      Relevant Orders   Hemoglobin L3J   Basic metabolic panel   Medication monitoring encounter    Check CBC      Relevant Orders   CBC with Differential/Platelet   HLD (hyperlipidemia)    Much improved per last lipid panel; continue statin, limit saturated fats      Controlled substance agreement signed    Up-to-date agreement on chart; will get UDS today per routine; no concern for misuse or diversion; reviewed the [place] web site, no red flags; we are moving away from chronic pain management at this clinic, so I appreciate Dr. Holley Raring agreeing to see her for her chronic pain      Relevant Orders   Urine Drug Screen w/Alc, no confirm    Other Visit Diagnoses    Skin lesion of left arm       refer to derm   Relevant Orders   Ambulatory referral to Dermatology   Therapeutic drug monitoring       Relevant Orders   Urine Drug Screen w/Alc, no confirm   Weight loss       Relevant Orders   TSH       Follow up plan: Return in about 3 months (around 05/04/2017).  An after-visit summary was printed and given to the patient at [place].  Please see the patient instructions which may contain other information and recommendations beyond what is mentioned above in the assessment and plan.  Meds ordered this encounter  Medications  .  HYDROcodone-acetaminophen (NORCO/VICODIN) 5-325 MG tablet    Sig: Take 1 tablet by mouth 2 (two) times daily as needed for moderate pain.    Dispense:  60 tablet    Refill:  0    Controlled substance contract; CHRONIC pain    Orders Placed This Encounter  Procedures  . Hemoglobin A1c  . Basic metabolic panel  . Urine Drug Screen w/Alc, no confirm  . CBC with Differential/Platelet  . TSH  . Ambulatory referral to Dermatology  . Ambulatory referral to Cardiology  . Ambulatory referral to Pulmonology

## 2017-02-03 NOTE — Assessment & Plan Note (Signed)
Controlled; work on weight loss; DASH guidelines

## 2017-02-03 NOTE — Assessment & Plan Note (Signed)
Referred to pain clinic

## 2017-02-03 NOTE — Assessment & Plan Note (Signed)
Refer to Dr. Ramachandran 

## 2017-02-03 NOTE — Assessment & Plan Note (Signed)
Much improved per last lipid panel; continue statin, limit saturated fats

## 2017-02-03 NOTE — Assessment & Plan Note (Signed)
Continue long-acting nitrate; refer back to cardiologist, Dr. Clayborn Bigness

## 2017-02-03 NOTE — Assessment & Plan Note (Addendum)
Up-to-date agreement on chart; will get UDS today per routine; no concern for misuse or diversion; reviewed the [place] web site, no red flags; we are moving away from chronic pain management at this clinic, so I appreciate Dr. Holley Raring agreeing to see her for her chronic pain

## 2017-02-03 NOTE — Telephone Encounter (Signed)
I contacted this patient to inform her that she has been scheduled to see Dr. Clayborn Bigness with [facility] on Tuesday, February 07, 2017 @ 9:00am.  Patient said ok.

## 2017-02-03 NOTE — Patient Instructions (Addendum)
Please do see the pain clinic for your pain medicine; Dr. Holley Raring will be your specialist for this Please contact his office and schedule an appointment as soon as you can Try to limit saturated fats in your diet (bologna, hot dogs, barbeque, cheeseburgers, hamburgers, steak, bacon, sausage, cheese, etc.) and get more fresh fruits, vegetables, and whole grains Try to follow the DASH guidelines (DASH stands for Dietary Approaches to Stop Hypertension). Try to limit the sodium in your diet to no more than 1,500mg  of sodium per day. Certainly try to not exceed 2,000 mg per day at the very most. Do not add salt when cooking or at the table.  Check the sodium amount on labels when shopping, and choose items lower in sodium when given a choice. Avoid or limit foods that already contain a lot of sodium. Eat a diet rich in fruits and vegetables and whole grains, and try to lose weight if overweight or obese We'll have you see a sleep doctor for your sleep apnea We'll get you back in to see Dr. Clayborn Bigness; keep taking your aspirin We'll have you see a skin doctor about the place on your arm

## 2017-02-03 NOTE — Assessment & Plan Note (Signed)
Check A1c and glucose 

## 2017-02-03 NOTE — Assessment & Plan Note (Signed)
Check CBC 

## 2017-02-03 NOTE — Assessment & Plan Note (Signed)
Continue pain management with hydrocodone; using walker; Dr. Holley Raring has agreed to see her to help manage her chronic pain; one month of Rx given today; asking patient to contact his office to schedule an appointment

## 2017-02-04 LAB — CBC WITH DIFFERENTIAL/PLATELET
BASOS ABS: 54 {cells}/uL (ref 0–200)
BASOS PCT: 0.8 %
EOS PCT: 2 %
Eosinophils Absolute: 134 cells/uL (ref 15–500)
HEMATOCRIT: 35.7 % (ref 35.0–45.0)
HEMOGLOBIN: 11.7 g/dL (ref 11.7–15.5)
LYMPHS ABS: 3283 {cells}/uL (ref 850–3900)
MCH: 26.8 pg — ABNORMAL LOW (ref 27.0–33.0)
MCHC: 32.8 g/dL (ref 32.0–36.0)
MCV: 81.9 fL (ref 80.0–100.0)
MPV: 11.1 fL (ref 7.5–12.5)
Monocytes Relative: 6.6 %
NEUTROS ABS: 2787 {cells}/uL (ref 1500–7800)
Neutrophils Relative %: 41.6 %
Platelets: 239 10*3/uL (ref 140–400)
RBC: 4.36 10*6/uL (ref 3.80–5.10)
RDW: 12.7 % (ref 11.0–15.0)
Total Lymphocyte: 49 %
WBC mixed population: 442 cells/uL (ref 200–950)
WBC: 6.7 10*3/uL (ref 3.8–10.8)

## 2017-02-04 LAB — HEMOGLOBIN A1C
EAG (MMOL/L): 7.4 (calc)
Hgb A1c MFr Bld: 6.3 % of total Hgb — ABNORMAL HIGH (ref <5.7)
Mean Plasma Glucose: 134 (calc)

## 2017-02-04 LAB — DRUG SCREEN URINE W/ALC, NO CONF
ALCOHOL, ETHYL (U): NEGATIVE
AMPHETAMINES (1000 NG/ML SCRN): NEGATIVE
BARBITURATES: NEGATIVE
BENZODIAZEPINES: NEGATIVE
COCAINE METABOLITES: NEGATIVE
MARIJUANA MET (50 ng/mL SCRN): NEGATIVE
METHADONE: NEGATIVE
METHAQUALONE: NEGATIVE
OPIATES: POSITIVE — AB
PHENCYCLIDINE: NEGATIVE
PROPOXYPHENE: NEGATIVE

## 2017-02-04 LAB — BASIC METABOLIC PANEL
BUN / CREAT RATIO: 17 (calc) (ref 6–22)
BUN: 15 mg/dL (ref 7–25)
CO2: 30 mmol/L (ref 20–32)
Calcium: 9.3 mg/dL (ref 8.6–10.4)
Chloride: 102 mmol/L (ref 98–110)
Creat: 0.89 mg/dL — ABNORMAL HIGH (ref 0.60–0.88)
GLUCOSE: 111 mg/dL — AB (ref 65–99)
Potassium: 4 mmol/L (ref 3.5–5.3)
Sodium: 138 mmol/L (ref 135–146)

## 2017-02-04 LAB — TSH: TSH: 0.76 mIU/L (ref 0.40–4.50)

## 2017-02-06 ENCOUNTER — Encounter: Payer: Self-pay | Admitting: Family Medicine

## 2017-02-06 ENCOUNTER — Other Ambulatory Visit: Payer: Self-pay

## 2017-02-06 DIAGNOSIS — R7301 Impaired fasting glucose: Secondary | ICD-10-CM | POA: Insufficient documentation

## 2017-02-06 MED ORDER — ISOSORBIDE MONONITRATE ER 120 MG PO TB24: 120.0000 mg | ORAL_TABLET | Freq: Every day | ORAL | 3 refills | Status: DC

## 2017-02-06 NOTE — Telephone Encounter (Signed)
Called pt informed her of lab results. Pt gave verbal understanding. Also states that she needs refill on imdur. Please advise. Thanks!

## 2017-02-06 NOTE — Telephone Encounter (Signed)
-----   Message from Arnetha Courser, MD sent at 02/06/2017 10:16 AM EST ----- Guerry Minors, please let the patient know that her 3 month blood sugar average is stable; really work on weight loss to help prevent going into diabetes in the future; UDS was fine; other labs okay; thank you

## 2017-02-06 NOTE — Telephone Encounter (Signed)
Rx approved

## 2017-02-22 ENCOUNTER — Encounter: Payer: Self-pay | Admitting: Internal Medicine

## 2017-02-22 ENCOUNTER — Ambulatory Visit (INDEPENDENT_AMBULATORY_CARE_PROVIDER_SITE_OTHER): Payer: Medicare Other | Admitting: Internal Medicine

## 2017-02-22 VITALS — BP 140/76 | HR 75 | Ht 60.0 in | Wt 167.0 lb

## 2017-02-22 DIAGNOSIS — G4719 Other hypersomnia: Secondary | ICD-10-CM

## 2017-02-22 DIAGNOSIS — G4733 Obstructive sleep apnea (adult) (pediatric): Secondary | ICD-10-CM | POA: Diagnosis not present

## 2017-02-22 NOTE — Progress Notes (Signed)
[facility] Consultation      Assessment and Plan:   Obstructive sleep apnea with excessive daytime sleepiness.  -Was previously on CPAP, but stopped, possibly due to machine malfunction, with loud noise. - It has been several years since she was on CPAP, will send for a new sleep study, restart CPAP as indicated.  GERD, essential hypertension.  -Obstructive sleep apnea can contribute to the above conditions, therefore it is important to treat sleep apnea in order to optimize treatment of the above conditions.  Date: 02/22/2017  MRN# [id number] Jocelyn [name] 1931-11-12   Jocelyn [name] is a 84 y.o. old female seen in consultation for chief complaint of:    Chief Complaint  Patient presents with  . Advice Only    Referred by Dr.Lada for OSA: patient previously on cpap therapy, last sleep study 15-20 yrs ago.    HPI:   The patient is an 84 year old female she typically goes to bed between 11 PM and 1 AM.  She falls asleep within 15 minutes she gets out of bed at 5:30 AM. She has a history of obstructive sleep apnea diagnosed several years ago. She used to be on CPAP, she thinks it was started 10-15 years ago. They felt that it was loud and it was not doing her any good, because she continued to snore.  She feels that she is sleep during the day, she occasionally takes a nap during the day, denies parasomnias, sleep attacks.     PMHX:   Past Medical History:  Diagnosis Date  . Arthritis of both knees 08/01/2015  . Essential hypertension, benign 07/28/2014  . GERD (gastroesophageal reflux disease)   . Hx of iron deficiency anemia 09/02/2015  . Hyperlipidemia   . Hypertension   . Lumbar pain   . OSA on CPAP 08/01/2015  . Overweight (BMI 25.0-29.9) 07/10/2013  . Prediabetes 09/02/2015   Surgical Hx:  Past Surgical History:  Procedure Laterality Date  . ABDOMINAL HYSTERECTOMY    . CATARACT EXTRACTION    . CHOLECYSTECTOMY    . LUMBAR DISC SURGERY      Family Hx:  Family History  Problem Relation Age of Onset  . Hypertension Daughter   . Diabetes Daughter   . Diabetes Mother   . Heart disease Mother   . Cancer Father        lung; smoker   Social Hx:   Social History   Tobacco Use  . Smoking status: Never Smoker  . Smokeless tobacco: Never Used  Substance Use Topics  . Alcohol use: No    Alcohol/week: 0.0 oz  . Drug use: No   Medication:    Current Outpatient Medications:  .  aspirin EC 81 MG tablet, Take 1 tablet (81 mg total) by mouth daily., Disp: 30 tablet, Rfl: 12 .  atorvastatin (LIPITOR) 40 MG tablet, Take 1 tablet (40 mg total) by mouth at bedtime., Disp: 90 tablet, Rfl: 3 .  cloNIDine (CATAPRES) 0.1 MG tablet, Take 1 tablet (0.1 mg total) by mouth every 8 (eight) hours., Disp: 270 tablet, Rfl: 3 .  diclofenac sodium (VOLTAREN) 1 % GEL, Apply 4 g topically 4 (four) times daily as needed. On the knees, Disp: 100 g, Rfl: 5 .  gabapentin (NEURONTIN) 100 MG capsule, Take 1 capsule (100 mg total) by mouth at bedtime., Disp: 90 capsule, Rfl: 3 .  HYDROcodone-acetaminophen (NORCO/VICODIN) 5-325 MG tablet, Take 1 tablet by mouth 2 (two) times daily as needed for moderate pain., Disp:  60 tablet, Rfl: 0 .  isosorbide mononitrate (IMDUR) 120 MG 24 hr tablet, Take 1 tablet (120 mg total) by mouth daily., Disp: 90 tablet, Rfl: 3 .  Multiple Vitamins-Minerals (CENTRUM SILVER 50+WOMEN) TABS, Take 1 tablet by mouth daily., Disp: , Rfl:  .  Olmesartan-Amlodipine-HCTZ 40-10-25 MG TABS, Take 1 tablet by mouth daily., Disp: 90 tablet, Rfl: 3 .  ranitidine (ZANTAC) 150 MG tablet, Take 1 tablet (150 mg total) by mouth 2 (two) times daily. If needed for reflux and heartburn, Disp: 60 tablet, Rfl: 11 .  albuterol (PROVENTIL HFA;VENTOLIN HFA) 108 (90 Base) MCG/ACT inhaler, Inhale into the lungs., Disp: , Rfl:    Allergies:  Patient has no known allergies.  Review of Systems: Gen:  Denies  fever, sweats, chills HEENT: Denies blurred  vision, double vision. bleeds, sore throat Cvc:  No dizziness, chest pain. Resp:   Denies cough or sputum production, shortness of breath Gi: Denies swallowing difficulty, stomach pain. Gu:  Denies bladder incontinence, burning urine Ext:   No Joint pain, stiffness. Skin: No skin rash,  hives  Endoc:  No polyuria, polydipsia. Psych: No depression, insomnia. Other:  All other systems were reviewed with the patient and were negative other that what is mentioned in the HPI.   Physical Examination:   VS: BP 140/76 (BP Location: Left Arm, Cuff Size: Normal)   Pulse 75   Ht 5' (1.524 m)   Wt 147 lb (66.7 kg)   SpO2 96%   BMI 28.71 kg/m   General Appearance: No distress  Neuro:without focal findings,  speech normal,  HEENT: PERRLA, EOM intact.  Mallampati 3 Pulmonary: normal breath sounds, No wheezing.  CardiovascularNormal S1,S2.  No m/r/g.   Abdomen: Benign, Soft, non-tender. Renal:  No costovertebral tenderness  GU:  No performed at this time. Endoc: No evident thyromegaly, no signs of acromegaly. Skin:   warm, no rashes, no ecchymosis  Extremities: normal, no cyanosis, clubbing.  Other findings:    LABORATORY PANEL:   CBC No results for input(s): WBC, HGB, HCT, PLT in the last 168 hours. ------------------------------------------------------------------------------------------------------------------  Chemistries  No results for input(s): NA, K, CL, CO2, GLUCOSE, BUN, CREATININE, CALCIUM, MG, AST, ALT, ALKPHOS, BILITOT in the last 168 hours.  Invalid input(s): GFRCGP ------------------------------------------------------------------------------------------------------------------  Cardiac Enzymes No results for input(s): TROPONINI in the last 168 hours. ------------------------------------------------------------  RADIOLOGY:  No results found.     Thank  you for the consultation and for allowing [facility] to assist in the care of your  patient. Our recommendations are noted above.  Please contact us if we can be of further service.   Marda Stalker, MD.  Board Certified in Internal Medicine, Pulmonary Medicine, [place], and Sleep Medicine.  [facility] Office Number: [phone]  Patricia Pesa, M.D.  Merton Border, M.D  02/22/2017

## 2017-02-22 NOTE — Patient Instructions (Signed)
--  Will send for sleep study.    

## 2017-02-28 ENCOUNTER — Encounter: Payer: Medicare Other | Admitting: Family Medicine

## 2017-03-07 ENCOUNTER — Ambulatory Visit: Payer: Medicare Other | Admitting: Student in an Organized Health Care Education/Training Program

## 2017-03-23 ENCOUNTER — Encounter: Payer: Self-pay | Admitting: Family Medicine

## 2017-03-23 ENCOUNTER — Ambulatory Visit: Payer: Medicare Other | Admitting: Family Medicine

## 2017-03-23 VITALS — BP 138/74 | HR 85 | Temp 98.4°F | Wt 168.0 lb

## 2017-03-23 DIAGNOSIS — Z780 Asymptomatic menopausal state: Secondary | ICD-10-CM

## 2017-03-23 DIAGNOSIS — E669 Obesity, unspecified: Secondary | ICD-10-CM | POA: Diagnosis not present

## 2017-03-23 DIAGNOSIS — I10 Essential (primary) hypertension: Secondary | ICD-10-CM

## 2017-03-23 DIAGNOSIS — M170 Bilateral primary osteoarthritis of knee: Secondary | ICD-10-CM

## 2017-03-23 DIAGNOSIS — M5136 Other intervertebral disc degeneration, lumbar region: Secondary | ICD-10-CM

## 2017-03-23 MED ORDER — HYDROCODONE-ACETAMINOPHEN 5-325 MG PO TABS: 1.0000 | ORAL_TABLET | Freq: Three times a day (TID) | ORAL | 0 refills | Status: DC | PRN

## 2017-03-23 MED ORDER — ISOSORBIDE MONONITRATE ER 120 MG PO TB24
120.0000 mg | ORAL_TABLET | Freq: Every day | ORAL | 3 refills | Status: DC
Start: 2017-03-23 — End: 2018-02-11

## 2017-03-23 MED ORDER — RANITIDINE HCL 150 MG PO TABS
150.0000 mg | ORAL_TABLET | Freq: Two times a day (BID) | ORAL | 3 refills | Status: DC
Start: 2017-03-23 — End: 2017-12-08

## 2017-03-23 MED ORDER — ATORVASTATIN CALCIUM 40 MG PO TABS: 40.0000 mg | ORAL_TABLET | Freq: Every day | ORAL | 3 refills | Status: DC

## 2017-03-23 NOTE — Progress Notes (Signed)
Closing out old note

## 2017-03-23 NOTE — Assessment & Plan Note (Signed)
Well controlled 

## 2017-03-23 NOTE — Telephone Encounter (Signed)
I am closing out an old phone note Patient has already been seen since this encounter [place]

## 2017-03-23 NOTE — Assessment & Plan Note (Signed)
Refer to orthopaedist; I will continue pain medicine for now; try topical agent

## 2017-03-23 NOTE — Progress Notes (Signed)
BP 138/74 (BP Location: Left Arm, Patient Position: Sitting, Cuff Size: Large)   Pulse 85   Temp 98.4 F (36.9 C) (Oral)   Wt 168 lb (76.2 kg)   SpO2 98%   BMI 32.81 kg/m    Subjective:    Patient ID: Jocelyn [name], female    DOB: 1932-01-31, 84 y.o.   MRN: [id number]  HPI: Jocelyn [name] is a 84 y.o. female  Chief Complaint  Patient presents with  . Follow-up    HPI Patient is here for f/u She broke her left wrist many years ago; healed up on its own; no recent DEXA scan; gets calcium in her diet; ice, cheese, lots of greens  HTN; well-controlled  High cholesterol; statin on her list; does not like hamburgers or bacon; one piece of sausage this morning Lab Results  Component Value Date   CHOL 184 11/03/2016   HDL 58 11/03/2016   LDLCALC 143 (H) 07/07/2016   TRIG 80 11/03/2016   CHOLHDL 3.2 11/03/2016   She has chronic pain; she uses a walker to ambulate; getting up is hard; her back hurts a lot; she would like to see the orthopaedist where she had her knees done (arthritis); pain started to come back now; they did the injections for her arthritis; pain medicine does not make her feel drunk or goofy; she thinks the pain medicine needs to be raissed a little bit; she is starting to get tolerant to the medicine, not as effective as before; occasional constipation if eating too much cheese; brought in her pill bottle, 4 white oblong pills IP 109, scored on the other side  Depression screen [facility] 2/9 03/23/2017 02/03/2017 11/03/2016 07/07/2016 02/29/2016  Decreased Interest 0 0 0 0 0  Down, Depressed, Hopeless 0 1 0 0 1  PHQ - 2 Score 0 1 0 0 1    Relevant past medical, surgical, family and social history reviewed Past Medical History:  Diagnosis Date  . Arthritis of both knees 08/01/2015  . Essential hypertension, benign 07/28/2014  . GERD (gastroesophageal reflux disease)   . Hx of iron deficiency anemia 09/02/2015  . Hyperlipidemia   . Hypertension   . Lumbar pain   .  OSA on CPAP 08/01/2015  . Overweight (BMI 25.0-29.9) 07/10/2013  . Prediabetes 09/02/2015   Past Surgical History:  Procedure Laterality Date  . ABDOMINAL HYSTERECTOMY    . CATARACT EXTRACTION    . CHOLECYSTECTOMY    . LUMBAR DISC SURGERY     Family History  Problem Relation Age of Onset  . Hypertension Daughter   . Diabetes Daughter   . Diabetes Mother   . Heart disease Mother   . Cancer Father        lung; smoker   Social History   Tobacco Use  . Smoking status: Never Smoker  . Smokeless tobacco: Never Used  Substance Use Topics  . Alcohol use: No    Alcohol/week: 0.0 oz  . Drug use: No    Interim medical history since last visit reviewed. Allergies and medications reviewed  Review of Systems Per HPI unless specifically indicated above     Objective:    BP 138/74 (BP Location: Left Arm, Patient Position: Sitting, Cuff Size: Large)   Pulse 85   Temp 98.4 F (36.9 C) (Oral)   Wt 168 lb (76.2 kg)   SpO2 98%   BMI 32.81 kg/m   Wt Readings from Last 3 Encounters:  03/23/17 168 lb (76.2 kg)  02/22/17  167 lb (75.8 kg)  02/03/17 165 lb 12.8 oz (75.2 kg)    Physical Exam  Constitutional: She appears well-developed and well-nourished. No distress.  obese  HENT:  Head: Normocephalic and atraumatic.  Eyes: EOM are normal. No scleral icterus.  Neck: No thyromegaly present.  Cardiovascular: Normal rate, regular rhythm and normal heart sounds.  Pulmonary/Chest: Effort normal and breath sounds normal. She has no wheezes.  Abdominal: Soft. Normal appearance and bowel sounds are normal. She exhibits no distension.  Musculoskeletal: She exhibits no edema.       Left wrist: She exhibits deformity (radial aspect deformed).       Right knee: She exhibits swelling and deformity. She exhibits no effusion.       Left knee: She exhibits swelling and deformity.       Lumbar back: She exhibits decreased range of motion, tenderness (lumbar region) and deformity (scoliosis).    Tender left of the midline  Neurological: She is alert. She displays no tremor.  Gait not assessed; using a walker today  Skin: Skin is warm and dry. She is not diaphoretic. No pallor.  Psychiatric: She has a normal mood and affect. Her behavior is normal. Judgment and thought content normal. Her mood appears not anxious. She does not exhibit a depressed mood.   Results for orders placed or performed in visit on 02/03/17  Hemoglobin A1c  Result Value Ref Range   Hgb A1c MFr Bld 6.3 (H) <5.7 % of total Hgb   Mean Plasma Glucose 134 (calc)   eAG (mmol/L) 7.4 (calc)  Basic metabolic panel  Result Value Ref Range   Glucose, Bld 111 (H) 65 - 99 mg/dL   BUN 15 7 - 25 mg/dL   Creat 0.89 (H) 0.60 - 0.88 mg/dL   BUN/Creatinine Ratio 17 6 - 22 (calc)   Sodium 138 135 - 146 mmol/L   Potassium 4.0 3.5 - 5.3 mmol/L   Chloride 102 98 - 110 mmol/L   CO2 30 20 - 32 mmol/L   Calcium 9.3 8.6 - 10.4 mg/dL  TSH  Result Value Ref Range   TSH 0.76 0.40 - 4.50 mIU/L  CBC with Differential/Platelet  Result Value Ref Range   WBC 6.7 3.8 - 10.8 Thousand/uL   RBC 4.36 3.80 - 5.10 Million/uL   Hemoglobin 11.7 11.7 - 15.5 g/dL   HCT 35.7 35.0 - 45.0 %   MCV 81.9 80.0 - 100.0 fL   MCH 26.8 (L) 27.0 - 33.0 pg   MCHC 32.8 32.0 - 36.0 g/dL   RDW 12.7 11.0 - 15.0 %   Platelets 239 140 - 400 Thousand/uL   MPV 11.1 7.5 - 12.5 fL   Neutro Abs 2,787 1,500 - 7,800 cells/uL   Lymphs Abs 3,283 850 - 3,900 cells/uL   WBC mixed population 442 200 - 950 cells/uL   Eosinophils Absolute 134 15 - 500 cells/uL   Basophils Absolute 54 0 - 200 cells/uL   Neutrophils Relative % 41.6 %   Total Lymphocyte 49.0 %   Monocytes Relative 6.6 %   Eosinophils Relative 2.0 %   Basophils Relative 0.8 %  Urine Drug Screen w/Alc, no confirm  Result Value Ref Range   Please note     AMPHETAMINES (1000 ng/mL SCRN) NEGATIVE    BARBITURATES NEGATIVE    BENZODIAZEPINES NEGATIVE    COCAINE METABOLITES NEGATIVE    MARIJUANA MET  (50 ng/mL SCRN) NEGATIVE    METHADONE NEGATIVE    METHAQUALONE NEGATIVE    OPIATES  POSITIVE (A)    PHENCYCLIDINE NEGATIVE    PROPOXYPHENE NEGATIVE    ALCOHOL, ETHYL (U) NEGATIVE       Assessment & Plan:   Problem List Items Addressed This Visit      Cardiovascular and Mediastinum   Essential hypertension, benign    Well-controlled      Relevant Medications   atorvastatin (LIPITOR) 40 MG tablet   isosorbide mononitrate (IMDUR) 120 MG 24 hr tablet     Musculoskeletal and Integument   DDD (degenerative disc disease), lumbar - Primary    Refer to orthopaedist; I will continue pain medicine for now; try topical agent      Relevant Medications   HYDROcodone-acetaminophen (NORCO/VICODIN) 5-325 MG tablet   HYDROcodone-acetaminophen (NORCO/VICODIN) 5-325 MG tablet   HYDROcodone-acetaminophen (NORCO/VICODIN) 5-325 MG tablet   Other Relevant Orders   Ambulatory referral to Orthopedic Surgery   Arthritis of both knees    Refer to orthopaedist, [facility]; I will continue pain medicine; try topical agent diclofenac in addition      Relevant Medications   HYDROcodone-acetaminophen (NORCO/VICODIN) 5-325 MG tablet   HYDROcodone-acetaminophen (NORCO/VICODIN) 5-325 MG tablet   HYDROcodone-acetaminophen (NORCO/VICODIN) 5-325 MG tablet   Other Relevant Orders   Ambulatory referral to Orthopedic Surgery     Other   Obesity (BMI 30.0-34.9)    Try to lose 10 pounds which will help reduce knee pain       Other Visit Diagnoses    Postmenopausal       Relevant Orders   DG Bone Density      First set of pain medicine VOIDED in front of staff with black marker; I wrote for 60, 30, and 30, instead of 70 on all three prescriptions; correct 70 Rx given to patient, all three on one sheet  Follow up plan: Return in about 3 months (around 06/20/2017).  An after-visit summary was printed and given to the patient at [place].  Please see the patient instructions which may contain other  information and recommendations beyond what is mentioned above in the assessment and plan.  Meds ordered this encounter  Medications  . DISCONTD: HYDROcodone-acetaminophen (NORCO/VICODIN) 5-325 MG tablet    Sig: Take 1 tablet by mouth every 8 (eight) hours as needed for moderate pain.    Dispense:  60 tablet    Refill:  0    Controlled substance contract; CHRONIC pain; #1  . DISCONTD: HYDROcodone-acetaminophen (NORCO/VICODIN) 5-325 MG tablet    Sig: Take 1 tablet by mouth every 8 (eight) hours as needed for moderate pain.    Dispense:  30 tablet    Refill:  0    #2 fill on or after April 21, 2017  . DISCONTD: HYDROcodone-acetaminophen (NORCO/VICODIN) 5-325 MG tablet    Sig: Take 1 tablet by mouth every 8 (eight) hours as needed for moderate pain.    Dispense:  30 tablet    Refill:  0    #3, fill on or after May 21, 2017  . atorvastatin (LIPITOR) 40 MG tablet    Sig: Take 1 tablet (40 mg total) by mouth at bedtime.    Dispense:  90 tablet    Refill:  3  . ranitidine (ZANTAC) 150 MG tablet    Sig: Take 1 tablet (150 mg total) by mouth 2 (two) times daily. If needed for reflux and heartburn    Dispense:  180 tablet    Refill:  3  . isosorbide mononitrate (IMDUR) 120 MG 24 hr tablet    Sig:  Take 1 tablet (120 mg total) by mouth daily.    Dispense:  90 tablet    Refill:  3  . HYDROcodone-acetaminophen (NORCO/VICODIN) 5-325 MG tablet    Sig: Take 1 tablet by mouth every 8 (eight) hours as needed for moderate pain.    Dispense:  70 tablet    Refill:  0    Controlled substance contract; CHRONIC pain; #1  . HYDROcodone-acetaminophen (NORCO/VICODIN) 5-325 MG tablet    Sig: Take 1 tablet by mouth every 8 (eight) hours as needed for moderate pain.    Dispense:  70 tablet    Refill:  0    #2 fill on or after April 21, 2017  . HYDROcodone-acetaminophen (NORCO/VICODIN) 5-325 MG tablet    Sig: Take 1 tablet by mouth every 8 (eight) hours as needed for moderate pain.    Dispense:  70  tablet    Refill:  0    #3, fill on or after May 21, 2017    Orders Placed This Encounter  Procedures  . DG Bone Density  . Ambulatory referral to Orthopedic Surgery

## 2017-03-23 NOTE — Progress Notes (Signed)
Closing out scanned document note open since  June 2018 Already wet signed

## 2017-03-23 NOTE — Telephone Encounter (Signed)
Closing out old note  Routing History   Priority Sent On From To Message Type   10/24/2016 11:58 AM Lada, Satira Anis, MD Cathrine Muster, [place]    10/24/2016 11:56 AM Cathrine Muster, CMA Arnetha Courser, MD    10/24/2016 10:53 AM Myatt, Cathrine Muster, CMA   Created by   Regan Rakers on 10/24/2016 10:53 AM

## 2017-03-23 NOTE — Assessment & Plan Note (Signed)
Try to lose 10 pounds which will help reduce knee pain

## 2017-03-23 NOTE — Patient Instructions (Signed)
The orthopaedist should be seeing you soon for your knees and back If you have not heard anything from my staff in a week about any orders/referrals/studies from today, please contact us here to follow-up [phone]

## 2017-03-23 NOTE — Telephone Encounter (Signed)
Closing out old note Patient has been seen already I'm not sure what happened, but I'm notifying IT of instances such as this because this is not typical of my excellent CMA  Routing History   Priority Sent On From To Message Type   01/11/2017 10:29 AM Lada, Satira Anis, MD Cathrine Muster, CMA Rx Request   01/11/2017 2:22 AM Interface, Surescripts Out P MELINDA LADA RX REFILL   Created by   Interface, Surescripts Out on 01/11/2017 02:22 AM

## 2017-03-23 NOTE — Assessment & Plan Note (Addendum)
Refer to orthopaedist, [facility]; I will continue pain medicine; try topical agent diclofenac in addition

## 2017-03-28 ENCOUNTER — Telehealth: Payer: Self-pay | Admitting: Family Medicine

## 2017-03-28 DIAGNOSIS — M51369 Other intervertebral disc degeneration, lumbar region without mention of lumbar back pain or lower extremity pain: Secondary | ICD-10-CM

## 2017-03-28 DIAGNOSIS — M5136 Other intervertebral disc degeneration, lumbar region: Secondary | ICD-10-CM

## 2017-03-28 DIAGNOSIS — M170 Bilateral primary osteoarthritis of knee: Secondary | ICD-10-CM

## 2017-03-28 DIAGNOSIS — R0602 Shortness of breath: Secondary | ICD-10-CM

## 2017-03-28 DIAGNOSIS — W19XXXA Unspecified fall, initial encounter: Secondary | ICD-10-CM

## 2017-03-28 NOTE — Telephone Encounter (Signed)
Copied from [place]. Topic: Quick Communication - See Telephone Encounter >> Mar 28, 2017  4:01 PM Cleaster Corin, NT wrote: CRM for notification. See Telephone encounter for:   03/28/17. Kennyth Lose from [facility] [employer] care calling and states that pt. Called and said that she needs a home health aide in the home and would like for someone to give her a call about having someone to come out. Pt. Can be reached at [phone]

## 2017-03-29 NOTE — Telephone Encounter (Signed)
That's fine; please order and contact her

## 2017-03-30 ENCOUNTER — Telehealth: Payer: Self-pay

## 2017-03-30 DIAGNOSIS — M170 Bilateral primary osteoarthritis of knee: Secondary | ICD-10-CM | POA: Diagnosis not present

## 2017-03-30 NOTE — Telephone Encounter (Signed)
Pt called back from previous encounter and states that there was a misunderstanding with ordering home health. She does not want this aid. Referral has already been placed, when they call pt was told she can just refuse care.

## 2017-03-30 NOTE — Telephone Encounter (Signed)
Tried to call pt x2, no voicemail set up.

## 2017-04-07 ENCOUNTER — Ambulatory Visit (INDEPENDENT_AMBULATORY_CARE_PROVIDER_SITE_OTHER): Payer: Medicare Other | Admitting: Family Medicine

## 2017-04-07 ENCOUNTER — Encounter: Payer: Self-pay | Admitting: Family Medicine

## 2017-04-07 VITALS — BP 124/68 | HR 82 | Temp 98.4°F | Resp 14 | Ht 60.0 in | Wt 169.9 lb

## 2017-04-07 DIAGNOSIS — H9193 Unspecified hearing loss, bilateral: Secondary | ICD-10-CM | POA: Diagnosis not present

## 2017-04-07 DIAGNOSIS — D239 Other benign neoplasm of skin, unspecified: Secondary | ICD-10-CM | POA: Diagnosis not present

## 2017-04-07 DIAGNOSIS — Z0000 Encounter for general adult medical examination without abnormal findings: Secondary | ICD-10-CM | POA: Diagnosis not present

## 2017-04-07 DIAGNOSIS — Z1231 Encounter for screening mammogram for malignant neoplasm of breast: Secondary | ICD-10-CM | POA: Diagnosis not present

## 2017-04-07 DIAGNOSIS — Z13820 Encounter for screening for osteoporosis: Secondary | ICD-10-CM

## 2017-04-07 DIAGNOSIS — Z1239 Encounter for other screening for malignant neoplasm of breast: Secondary | ICD-10-CM

## 2017-04-07 HISTORY — DX: Encounter for general adult medical examination without abnormal findings: Z00.00

## 2017-04-07 MED ORDER — TRIAMCINOLONE ACETONIDE 0.1 % EX CREA: 1.0000 "application " | TOPICAL_CREAM | Freq: Two times a day (BID) | CUTANEOUS | 0 refills | Status: DC

## 2017-04-07 NOTE — Progress Notes (Signed)
Patient: Jocelyn [name], Female    DOB: [birth year], 84 y.o.   MRN: [id number]  Visit Date: 04/07/2017  Today's Provider: Enid Derry, MD   Chief Complaint  Patient presents with  . Medicare Wellness    Subjective:   Jocelyn [name] is a 84 y.o. female who presents today for her Subsequent Annual Wellness Visit.  USPSTF grade A and B recommendations No new issues  Depression:  Depression screen [facility] 2/9 04/07/2017 03/23/2017 02/03/2017 11/03/2016 07/07/2016  Decreased Interest 0 0 0 0 0  Down, Depressed, Hopeless 0 0 1 0 0  PHQ - 2 Score 0 0 1 0 0   Hypertension: well-controlled; no headaches or dizziness, no missed doses; no chest pain BP Readings from Last 3 Encounters:  04/07/17 124/68  03/23/17 138/74  02/22/17 140/76   Obesity: stable Wt Readings from Last 3 Encounters:  04/07/17 169 lb 14.4 oz (77.1 kg)  03/23/17 168 lb (76.2 kg)  02/22/17 167 lb (75.8 kg)   BMI Readings from Last 3 Encounters:  04/07/17 33.18 kg/m  03/23/17 32.81 kg/m  02/22/17 32.61 kg/m    Skin cancer: education given, let us know of any changes; nothing worrisome not really growing; spot on the left lateral arm, itching Lung cancer:  nonsmoker Breast cancer: daughter had breast cancer; okay with checking but not sure if she would do anything Colorectal cancer: past age HIV: offered; widowed for one year; last intercourse more than 1 year ago STD testing and prevention (chl/gon/syphilis): patient vague about if wanting testing Intimate partner violence: no abuse Osteoporosis: order DEXA today; taking calcium; discussed fall risk Fall prevention/vitamin D: discussed Immunizations: due for shingrix; pneumonia vaccines UTD; flu UTD Diet: eats anything she wants, loves cheese Exercise: using walker; doing something all the time Alcohol: no Tobacco use: never AAA: n/a Aspirin: taking daily Glucose:  Glucose  Date Value Ref Range Status  04/13/2011 111 (H) 65 - 99 mg/dL Final   Glucose, Bld   Date Value Ref Range Status  02/03/2017 111 (H) 65 - 99 mg/dL Final    Comment:    .            Fasting reference interval . For someone without known diabetes, a glucose value between 100 and 125 mg/dL is consistent with prediabetes and should be confirmed with a follow-up test. .   07/07/2016 92 65 - 99 mg/dL Final  09/02/2015 88 65 - 99 mg/dL Final   Lipids:  Lab Results  Component Value Date   CHOL 184 11/03/2016   CHOL 218 (H) 07/07/2016   CHOL 166 07/08/2015   Lab Results  Component Value Date   HDL 58 11/03/2016   HDL 57 07/07/2016   HDL 60 07/08/2015   Lab Results  Component Value Date   LDLCALC 143 (H) 07/07/2016   LDLCALC 86 07/08/2015   LDLCALC 74 10/08/2014   Lab Results  Component Value Date   TRIG 80 11/03/2016   TRIG 89 07/07/2016   TRIG 99 07/08/2015   Lab Results  Component Value Date   CHOLHDL 3.2 11/03/2016   CHOLHDL 3.8 07/07/2016   CHOLHDL 2.7 10/08/2014   No results found for: LDLDIRECT  Review of Systems  Past Medical History:  Diagnosis Date  . Arthritis of both knees 08/01/2015  . Essential hypertension, benign 07/28/2014  . GERD (gastroesophageal reflux disease)   . Hx of iron deficiency anemia 09/02/2015  . Hyperlipidemia   . Hypertension   . Lumbar pain   . OSA  on CPAP 08/01/2015  . Overweight (BMI 25.0-29.9) 07/10/2013  . Prediabetes 09/02/2015    Past Surgical History:  Procedure Laterality Date  . ABDOMINAL HYSTERECTOMY    . CATARACT EXTRACTION    . CHOLECYSTECTOMY    . LUMBAR DISC SURGERY      Family History  Problem Relation Age of Onset  . Hypertension Daughter   . Diabetes Daughter   . Cancer Daughter        breast  . Diabetes Mother   . Heart disease Mother   . Cancer Father        lung; smoker  . Anuerysm Son     Social History   Tobacco Use  . Smoking status: Never Smoker  . Smokeless tobacco: Never Used  Substance Use Topics  . Alcohol use: No    Alcohol/week: 0.0 oz  . Drug use: No     Outpatient Encounter Medications as of 04/07/2017  Medication Sig Note  . aspirin EC 81 MG tablet Take 1 tablet (81 mg total) by mouth daily.   Marland Kitchen atorvastatin (LIPITOR) 40 MG tablet Take 1 tablet (40 mg total) by mouth at bedtime.   . cloNIDine (CATAPRES) 0.1 MG tablet Take 1 tablet (0.1 mg total) by mouth every 8 (eight) hours.   Marland Kitchen HYDROcodone-acetaminophen (NORCO/VICODIN) 5-325 MG tablet Take 1 tablet by mouth every 8 (eight) hours as needed for moderate pain.   . isosorbide mononitrate (IMDUR) 120 MG 24 hr tablet Take 1 tablet (120 mg total) by mouth daily.   . Multiple Vitamins-Minerals (CENTRUM SILVER 50+WOMEN) TABS Take 1 tablet by mouth daily.   . Olmesartan-Amlodipine-HCTZ 40-10-25 MG TABS Take 1 tablet by mouth daily.   . ranitidine (ZANTAC) 150 MG tablet Take 1 tablet (150 mg total) by mouth 2 (two) times daily. If needed for reflux and heartburn   . albuterol (PROVENTIL HFA;VENTOLIN HFA) 108 (90 Base) MCG/ACT inhaler Inhale into the lungs. 09/02/2015: Received from: [place]: Inhale 2 inhalations into the lungs every 6 (six) hours as needed for Wheezing.  . diclofenac sodium (VOLTAREN) 1 % GEL Apply 4 g topically 4 (four) times daily as needed. On the knees (Patient not taking: Reported on 04/07/2017)   . HYDROcodone-acetaminophen (NORCO/VICODIN) 5-325 MG tablet Take 1 tablet by mouth every 8 (eight) hours as needed for moderate pain.   Marland Kitchen HYDROcodone-acetaminophen (NORCO/VICODIN) 5-325 MG tablet Take 1 tablet by mouth every 8 (eight) hours as needed for moderate pain. (Patient not taking: Reported on 04/07/2017)   . triamcinolone cream (KENALOG) 0.1 % Apply 1 application topically 2 (two) times daily.    No facility-administered encounter medications on file as of 04/07/2017.     Functional Ability / Safety Screening 1.  Was the timed Get Up and Go test less than 12 seconds?  no 2.  Does the patient need help with the phone, transportation, shopping,       preparing meals, housework, laundry, medications, or managing money?  yes; family members; patient drives; patient thinks it is good; no accidents; no problems with vision; has not driven in a year; discussed availability of driving evaluation 3.  Does the patient's home have:  loose throw rugs in the hallway?   no      Grab bars in the bathroom? yes      Handrails on the stairs?   yes      Good lighting?   yes 4.  Has the patient noticed any hearing difficulties?   yes; has  hearing aide but maybe not working well; offered referral  Advanced Directives; discussion Does patient have a HCPOA?    no, paperwork given If yes, name and contact information: baby girl, Foye Clock, [phone] Does patient have a living will or MOST form?  no; paperwork given  Immunizations: reviewed, shingrix needed, all else UTD  Fall Risk Assessment See under rooming  Depression Screen See under rooming Depression screen [facility] 2/9 04/07/2017 03/23/2017 02/03/2017 11/03/2016 07/07/2016  Decreased Interest 0 0 0 0 0  Down, Depressed, Hopeless 0 0 1 0 0  PHQ - 2 Score 0 0 1 0 0    Objective:   Vitals: BP 124/68   Pulse 82   Temp 98.4 F (36.9 C) (Oral)   Resp 14   Ht 5' (1.524 m)   Wt 169 lb 14.4 oz (77.1 kg)   SpO2 95%   BMI 33.18 kg/m  Body mass index is 33.18 kg/m. No exam data present  Physical Exam  Constitutional: She appears well-developed and well-nourished. No distress.  Cardiovascular: Normal rate.  Pulmonary/Chest: Effort normal.  Neurological:  Ambulatory with walker  Skin:  Hyperpigmented lesion lateral LEFT arm, no significant induration; no crusting or telangiectasia  Psychiatric: She has a normal mood and affect.   Mood/affect:  euthyhmic Appearance:  Neatly dressed  6CIT Screen 04/07/2017  What Year? 0 points  What month? 0 points  What time? 0 points  Count back from 20 0 points  Months in reverse 0 points  Repeat phrase 0 points  Total Score 0    Assessment & Plan:      Annual Wellness Visit  Reviewed patient's Family Medical History Reviewed and updated list of patient's medical providers Assessment of cognitive impairment was done Assessed patient's functional ability Established a written schedule for health screening [place] Completed and Reviewed  Exercise Activities and Dietary recommendations Goals    . Patient Stated (pt-stated)     Puzzles and mind exercises       Immunization History  Administered Date(s) Administered  . Influenza Split 11/01/2006, 11/14/2007  . Influenza, High Dose Seasonal PF 10/08/2014, 02/29/2016, 11/03/2016  . Influenza, Seasonal, Injecte, Preservative Fre 10/12/2010, 11/09/2011  . Influenza,inj,Quad PF,6+ Mos 11/26/2012, 11/20/2013  . Influenza-Unspecified 10/31/2012  . Pneumococcal Conjugate-13 02/29/2016  . Pneumococcal Polysaccharide-23 11/09/2011  . Tdap 07/09/2010  . Zoster 11/09/2011    Health Maintenance  Topic Date Due  . DEXA SCAN  [birth year]  . TETANUS/TDAP  07/08/2020  . INFLUENZA VACCINE  Completed  . PNA vac Low Risk Adult  Completed    Discussed health benefits of physical activity, and encouraged her to engage in regular exercise appropriate for her age and condition.   Meds ordered this encounter  Medications  . triamcinolone cream (KENALOG) 0.1 %    Sig: Apply 1 application topically 2 (two) times daily.    Dispense:  30 g    Refill:  0    Current Outpatient Medications:  .  aspirin EC 81 MG tablet, Take 1 tablet (81 mg total) by mouth daily., Disp: 30 tablet, Rfl: 12 .  atorvastatin (LIPITOR) 40 MG tablet, Take 1 tablet (40 mg total) by mouth at bedtime., Disp: 90 tablet, Rfl: 3 .  cloNIDine (CATAPRES) 0.1 MG tablet, Take 1 tablet (0.1 mg total) by mouth every 8 (eight) hours., Disp: 270 tablet, Rfl: 3 .  HYDROcodone-acetaminophen (NORCO/VICODIN) 5-325 MG tablet, Take 1 tablet by mouth every 8 (eight) hours as needed for moderate pain., Disp: 70  tablet, Rfl: 0 .  isosorbide mononitrate (IMDUR) 120 MG 24 hr tablet, Take 1 tablet (120 mg total) by mouth daily., Disp: 90 tablet, Rfl: 3 .  Multiple Vitamins-Minerals (CENTRUM SILVER 50+WOMEN) TABS, Take 1 tablet by mouth daily., Disp: , Rfl:  .  Olmesartan-Amlodipine-HCTZ 40-10-25 MG TABS, Take 1 tablet by mouth daily., Disp: 90 tablet, Rfl: 3 .  ranitidine (ZANTAC) 150 MG tablet, Take 1 tablet (150 mg total) by mouth 2 (two) times daily. If needed for reflux and heartburn, Disp: 180 tablet, Rfl: 3 .  albuterol (PROVENTIL HFA;VENTOLIN HFA) 108 (90 Base) MCG/ACT inhaler, Inhale into the lungs., Disp: , Rfl:  .  diclofenac sodium (VOLTAREN) 1 % GEL, Apply 4 g topically 4 (four) times daily as needed. On the knees (Patient not taking: Reported on 04/07/2017), Disp: 100 g, Rfl: 5 .  HYDROcodone-acetaminophen (NORCO/VICODIN) 5-325 MG tablet, Take 1 tablet by mouth every 8 (eight) hours as needed for moderate pain., Disp: 70 tablet, Rfl: 0 .  HYDROcodone-acetaminophen (NORCO/VICODIN) 5-325 MG tablet, Take 1 tablet by mouth every 8 (eight) hours as needed for moderate pain. (Patient not taking: Reported on 04/07/2017), Disp: 70 tablet, Rfl: 0 .  triamcinolone cream (KENALOG) 0.1 %, Apply 1 application topically 2 (two) times daily., Disp: 30 g, Rfl: 0 There are no discontinued medications.  Next Medicare Wellness Visit in 12+ months  Problem List Items Addressed This Visit      Nervous and Auditory   Hearing loss    Refer back to audiologist      Relevant Orders   Ambulatory referral to Audiology     Other   Screening for osteoporosis   Relevant Orders   DG Bone Density   Medicare annual wellness visit, subsequent - Primary    USPSTF grade A and B recommendations reviewed with patient; age-appropriate recommendations, preventive care, screening tests, etc discussed and encouraged; healthy living encouraged; see AVS for patient education given to patient        Other Visit Diagnoses     Screening for breast cancer       discussed; pt asx; does not want mammo   Dermatofibroma       lesion on left arm appears benign; trial of TAC; watch closely, notify us if growing

## 2017-04-07 NOTE — Assessment & Plan Note (Signed)
USPSTF grade A and B recommendations reviewed with patient; age-appropriate recommendations, preventive care, screening tests, etc discussed and encouraged; healthy living encouraged; see AVS for patient education given to patient  

## 2017-04-07 NOTE — Progress Notes (Signed)
    HPI  Physical Exam

## 2017-04-07 NOTE — Patient Instructions (Addendum)
Consider getting the new shingles vaccine called Shingrix; that is available for individuals 82 years of age and older, and is recommended even if you have had shingles in the past and/or already received the old shingles vaccine (Zostavax); it is a two-part series, and is available at many local pharmacies Please do call to schedule your bone density study; the number to schedule one at either [facility] or [facility] is [phone] or [phone] Do see your eye doctor every year  Health Maintenance  Topic Date Due  . DEXA SCAN  05-Jan-1932  . TETANUS/TDAP  07/08/2020  . INFLUENZA VACCINE  Completed  . PNA vac Low Risk Adult  Completed  DEXA due NOW    Health Maintenance for Postmenopausal Women Menopause is a normal process in which your reproductive ability comes to an end. This process happens gradually over a span of months to years, usually between the ages of 71 and 67. Menopause is complete when you have missed 12 consecutive menstrual periods. It is important to talk with your health care provider about some of the most common conditions that affect postmenopausal women, such as heart disease, cancer, and bone loss (osteoporosis). Adopting a healthy lifestyle and getting preventive care can help to promote your health and wellness. Those actions can also lower your chances of developing some of these common conditions. What should I know about menopause? During menopause, you may experience a number of symptoms, such as:  Moderate-to-severe hot flashes.  Night sweats.  Decrease in sex drive.  Mood swings.  Headaches.  Tiredness.  Irritability.  Memory problems.  Insomnia.  Choosing to treat or not to treat menopausal changes is an individual decision that you make with your health care provider. What should I know about hormone replacement therapy and supplements? Hormone therapy products are effective for treating symptoms that are  associated with menopause, such as hot flashes and night sweats. Hormone replacement carries certain risks, especially as you become older. If you are thinking about using estrogen or estrogen with progestin treatments, discuss the benefits and risks with your health care provider. What should I know about heart disease and stroke? Heart disease, heart attack, and stroke become more likely as you age. This may be due, in part, to the hormonal changes that your body experiences during menopause. These can affect how your body processes dietary fats, triglycerides, and cholesterol. Heart attack and stroke are both medical emergencies. There are many things that you can do to help prevent heart disease and stroke:  Have your blood pressure checked at least every 1-2 years. High blood pressure causes heart disease and increases the risk of stroke.  If you are 71-83 years old, ask your health care provider if you should take aspirin to prevent a heart attack or a stroke.  Do not use any tobacco products, including cigarettes, chewing tobacco, or electronic cigarettes. If you need help quitting, ask your health care provider.  It is important to eat a healthy diet and maintain a healthy weight. ? Be sure to include plenty of vegetables, fruits, low-fat dairy products, and lean protein. ? Avoid eating foods that are high in solid fats, added sugars, or salt (sodium).  Get regular exercise. This is one of the most important things that you can do for your health. ? Try to exercise for at least 150 minutes each week. The type of exercise that you do should increase your heart rate and make you sweat. This is  known as moderate-intensity exercise. ? Try to do strengthening exercises at least twice each week. Do these in addition to the moderate-intensity exercise.  Know your numbers.Ask your health care provider to check your cholesterol and your blood glucose. Continue to have your blood tested as directed  by your health care provider.  What should I know about cancer screening? There are several types of cancer. Take the following steps to reduce your risk and to catch any cancer development as early as possible. Breast Cancer  Practice breast self-awareness. ? This means understanding how your breasts normally appear and feel. ? It also means doing regular breast self-exams. Let your health care provider know about any changes, no matter how small.  If you are 51 or older, have a clinician do a breast exam (clinical breast exam or CBE) every year. Depending on your age, family history, and medical history, it may be recommended that you also have a yearly breast X-ray (mammogram).  If you have a family history of breast cancer, talk with your health care provider about genetic screening.  If you are at high risk for breast cancer, talk with your health care provider about having an MRI and a mammogram every year.  Breast cancer (BRCA) gene test is recommended for women who have family members with BRCA-related cancers. Results of the assessment will determine the need for genetic counseling and BRCA1 and for BRCA2 testing. BRCA-related cancers include these types: ? Breast. This occurs in males or females. ? Ovarian. ? Tubal. This may also be called fallopian tube cancer. ? Cancer of the abdominal or pelvic lining (peritoneal cancer). ? Prostate. ? Pancreatic.  Cervical, Uterine, and Ovarian Cancer Your health care provider may recommend that you be screened regularly for cancer of the pelvic organs. These include your ovaries, uterus, and vagina. This screening involves a pelvic exam, which includes checking for microscopic changes to the surface of your cervix (Pap test).  For women ages 21-65, health care providers may recommend a pelvic exam and a Pap test every three years. For women ages 58-65, they may recommend the Pap test and pelvic exam, combined with testing for human papilloma  virus (HPV), every five years. Some types of HPV increase your risk of cervical cancer. Testing for HPV may also be done on women of any age who have unclear Pap test results.  Other health care providers may not recommend any screening for nonpregnant women who are considered low risk for pelvic cancer and have no symptoms. Ask your health care provider if a screening pelvic exam is right for you.  If you have had past treatment for cervical cancer or a condition that could lead to cancer, you need Pap tests and screening for cancer for at least 20 years after your treatment. If Pap tests have been discontinued for you, your risk factors (such as having a new sexual partner) need to be reassessed to determine if you should start having screenings again. Some women have medical problems that increase the chance of getting cervical cancer. In these cases, your health care provider may recommend that you have screening and Pap tests more often.  If you have a family history of uterine cancer or ovarian cancer, talk with your health care provider about genetic screening.  If you have vaginal bleeding after reaching menopause, tell your health care provider.  There are currently no reliable tests available to screen for ovarian cancer.  Lung Cancer Lung cancer screening is recommended for adults  46-35 years old who are at high risk for lung cancer because of a history of smoking. A yearly low-dose CT scan of the lungs is recommended if you:  Currently smoke.  Have a history of at least 30 pack-years of smoking and you currently smoke or have quit within the past 15 years. A pack-year is smoking an average of one pack of cigarettes per day for one year.  Yearly screening should:  Continue until it has been 15 years since you quit.  Stop if you develop a health problem that would prevent you from having lung cancer treatment.  Colorectal Cancer  This type of cancer can be detected and can often  be prevented.  Routine colorectal cancer screening usually begins at age 72 and continues through age 2.  If you have risk factors for colon cancer, your health care provider may recommend that you be screened at an earlier age.  If you have a family history of colorectal cancer, talk with your health care provider about genetic screening.  Your health care provider may also recommend using home test kits to check for hidden blood in your stool.  A small camera at the end of a tube can be used to examine your colon directly (sigmoidoscopy or colonoscopy). This is done to check for the earliest forms of colorectal cancer.  Direct examination of the colon should be repeated every 5-10 years until age 82. However, if early forms of precancerous polyps or small growths are found or if you have a family history or genetic risk for colorectal cancer, you may need to be screened more often.  Skin Cancer  Check your skin from head to toe regularly.  Monitor any moles. Be sure to tell your health care provider: ? About any new moles or changes in moles, especially if there is a change in a mole's shape or color. ? If you have a mole that is larger than the size of a pencil eraser.  If any of your family members has a history of skin cancer, especially at a young age, talk with your health care provider about genetic screening.  Always use sunscreen. Apply sunscreen liberally and repeatedly throughout the day.  Whenever you are outside, protect yourself by wearing long sleeves, pants, a wide-brimmed hat, and sunglasses.  What should I know about osteoporosis? Osteoporosis is a condition in which bone destruction happens more quickly than new bone creation. After menopause, you may be at an increased risk for osteoporosis. To help prevent osteoporosis or the bone fractures that can happen because of osteoporosis, the following is recommended:  If you are 77-58 years old, get at least 1,000 mg of  calcium and at least 600 mg of vitamin D per day.  If you are older than age 81 but younger than age 57, get at least 1,200 mg of calcium and at least 600 mg of vitamin D per day.  If you are older than age 65, get at least 1,200 mg of calcium and at least 800 mg of vitamin D per day.  Smoking and excessive alcohol intake increase the risk of osteoporosis. Eat foods that are rich in calcium and vitamin D, and do weight-bearing exercises several times each week as directed by your health care provider. What should I know about how menopause affects my mental health? Depression may occur at any age, but it is more common as you become older. Common symptoms of depression include:  Low or sad mood.  Changes  in sleep patterns.  Changes in appetite or eating patterns.  Feeling an overall lack of motivation or enjoyment of activities that you previously enjoyed.  Frequent crying spells.  Talk with your health care provider if you think that you are experiencing depression. What should I know about immunizations? It is important that you get and maintain your immunizations. These include:  Tetanus, diphtheria, and pertussis (Tdap) booster vaccine.  Influenza every year before the flu season begins.  Pneumonia vaccine.  Shingles vaccine.  Your health care provider may also recommend other immunizations. This information is not intended to replace advice given to you by your health care provider. Make sure you discuss any questions you have with your health care provider. Document Released: 03/11/2005 Document Revised: 08/07/2015 Document Reviewed: 10/21/2014 Elsevier Interactive Patient Education  2018 [place] Directive Advance directives are legal documents that let you make choices ahead of time about your health care and medical treatment in case you become unable to communicate for yourself. Advance directives are a way for you to communicate your wishes to family,  friends, and health care providers. This can help convey your decisions about end-of-life care if you become unable to communicate. Discussing and writing advance directives should happen over time rather than all at once. Advance directives can be changed depending on your situation and what you want, even after you have signed the advance directives. If you do not have an advance directive, some states assign family decision makers to act on your behalf based on how closely you are related to them. Each state has its own laws regarding advance directives. You may want to check with your health care provider, attorney, or state representative about the laws in your state. There are different types of advance directives, such as:  Medical power of attorney.  Living will.  Do not resuscitate (DNR) or do not attempt resuscitation (DNAR) order.  Health care proxy and medical power of attorney A health care proxy, also called a health care agent, is a person who is appointed to make medical decisions for you in cases in which you are unable to make the decisions yourself. Generally, people choose someone they know well and trust to represent their preferences. Make sure to ask this person for an agreement to act as your proxy. A proxy may have to exercise judgment in the event of a medical decision for which your wishes are not known. A medical power of attorney is a legal document that names your health care proxy. Depending on the laws in your state, after the document is written, it may also need to be:  Signed.  Notarized.  Dated.  Copied.  Witnessed.  Incorporated into your medical record.  You may also want to appoint someone to manage your financial affairs in a situation in which you are unable to do so. This is called a durable power of attorney for finances. It is a separate legal document from the durable power of attorney for health care. You may choose the same person or someone  different from your health care proxy to act as your agent in financial matters. If you do not appoint a proxy, or if there is a concern that the proxy is not acting in your best interests, a court-appointed guardian may be designated to act on your behalf. Living will A living will is a set of instructions documenting your wishes about medical care when you cannot express them yourself. Health care providers should  keep a copy of your living will in your medical record. You may want to give a copy to family members or friends. To alert caregivers in case of an emergency, you can place a card in your wallet to let them know that you have a living will and where they can find it. A living will is used if you become:  Terminally ill.  Incapacitated.  Unable to communicate or make decisions.  Items to consider in your living will include:  The use or non-use of life-sustaining equipment, such as dialysis machines and breathing machines (ventilators).  A DNR or DNAR order, which is the instruction not to use cardiopulmonary resuscitation (CPR) if breathing or heartbeat stops.  The use or non-use of tube feeding.  Withholding of food and fluids.  Comfort (palliative) care when the goal becomes comfort rather than a cure.  Organ and tissue donation.  A living will does not give instructions for distributing your money and property if you should pass away. It is recommended that you seek the advice of a lawyer when writing a will. Decisions about taxes, beneficiaries, and asset distribution will be legally binding. This process can relieve your family and friends of any concerns surrounding disputes or questions that may come up about the distribution of your assets. DNR or DNAR A DNR or DNAR order is a request not to have CPR in the event that your heart stops beating or you stop breathing. If a DNR or DNAR order has not been made and shared, a health care provider will try to help any patient  whose heart has stopped or who has stopped breathing. If you plan to have surgery, talk with your health care provider about how your DNR or DNAR order will be followed if problems occur. Summary  Advance directives are the legal documents that allow you to make choices ahead of time about your health care and medical treatment in case you become unable to communicate for yourself.  The process of discussing and writing advance directives should happen over time. You can change the advance directives, even after you have signed them.  Advance directives include DNR or DNAR orders, living wills, and designating an agent as your medical power of attorney. This information is not intended to replace advice given to you by your health care provider. Make sure you discuss any questions you have with your health care provider. Document Released: 04/26/2007 Document Revised: 12/07/2015 Document Reviewed: 12/07/2015 Elsevier Interactive Patient Education  2017 [employer].

## 2017-04-07 NOTE — Assessment & Plan Note (Signed)
Refer back to audiologist

## 2017-04-20 DIAGNOSIS — H903 Sensorineural hearing loss, bilateral: Secondary | ICD-10-CM | POA: Diagnosis not present

## 2017-04-20 DIAGNOSIS — H6122 Impacted cerumen, left ear: Secondary | ICD-10-CM | POA: Diagnosis not present

## 2017-04-29 ENCOUNTER — Other Ambulatory Visit: Payer: Self-pay | Admitting: Family Medicine

## 2017-04-29 NOTE — Telephone Encounter (Signed)
Too early for clonidine; I approved on year on 07/07/16 Note to pharmacy

## 2017-05-03 ENCOUNTER — Other Ambulatory Visit: Payer: Self-pay | Admitting: Family Medicine

## 2017-05-03 NOTE — Telephone Encounter (Signed)
Clonidine requested too early; one year prescribed 07/07/16

## 2017-05-05 ENCOUNTER — Ambulatory Visit: Payer: Medicare Other | Admitting: Family Medicine

## 2017-05-09 ENCOUNTER — Other Ambulatory Visit: Payer: Self-pay

## 2017-05-10 ENCOUNTER — Other Ambulatory Visit: Payer: Self-pay | Admitting: Family Medicine

## 2017-05-10 NOTE — Telephone Encounter (Signed)
Left voicemail with pharmacy 

## 2017-05-10 NOTE — Telephone Encounter (Signed)
PT called to check on status of refill

## 2017-05-10 NOTE — Telephone Encounter (Signed)
Please verify with patient how she is taking the clonidine; she should not run out until June

## 2017-05-22 NOTE — Progress Notes (Signed)
[name]ation      Assessment and Plan:   Obstructive sleep apnea with excessive daytime sleepiness.  -Was previously on CPAP, but stopped, possibly due to machine malfunction, with loud noise. - It has been several years since she was on CPAP, will send for a new sleep study, restart CPAP as indicated.  GERD, essential hypertension.  -Obstructive sleep apnea can contribute to the above conditions, therefore it is important to treat sleep apnea in order to optimize treatment of the above conditions.  Date: 05/22/2017  MRN# [id number] Jocelyn [name] 15-Mar-1931   Jocelyn [name] is a 84 y.o. old female seen in consultation for chief complaint of:    Chief Complaint  Patient presents with  . Sleep Apnea    pt was seen in Jan and has not had sleep study done due to financial.    HPI:   The patient is an 84 year old female with a history of obstructive sleep apnea.  Last visit it was noted that she had been off her CPAP for several years, and was interested in restarting due to progressive daytime sleepiness.  She was therefore sent for a new sleep study in order to requalify for CPAP.   Pt seems confused about it today and is asking how much it costs. She is still interested in starting on CPAP. Apparently they could not get in touch with her to set up the study so it was not done.   Medication:    Current Outpatient Medications:  .  albuterol (PROVENTIL HFA;VENTOLIN HFA) 108 (90 Base) MCG/ACT inhaler, Inhale into the lungs., Disp: , Rfl:  .  aspirin EC 81 MG tablet, Take 1 tablet (81 mg total) by mouth daily., Disp: 30 tablet, Rfl: 12 .  atorvastatin (LIPITOR) 40 MG tablet, Take 1 tablet (40 mg total) by mouth at bedtime., Disp: 90 tablet, Rfl: 3 .  cloNIDine (CATAPRES) 0.1 MG tablet, Take 1 tablet (0.1 mg total) by mouth every 8 (eight) hours., Disp: 270 tablet, Rfl: 3 .  diclofenac sodium (VOLTAREN) 1 % GEL, Apply 4 g topically 4 (four) times  daily as needed. On the knees (Patient not taking: Reported on 04/07/2017), Disp: 100 g, Rfl: 5 .  HYDROcodone-acetaminophen (NORCO/VICODIN) 5-325 MG tablet, Take 1 tablet by mouth every 8 (eight) hours as needed for moderate pain., Disp: 70 tablet, Rfl: 0 .  HYDROcodone-acetaminophen (NORCO/VICODIN) 5-325 MG tablet, Take 1 tablet by mouth every 8 (eight) hours as needed for moderate pain., Disp: 70 tablet, Rfl: 0 .  HYDROcodone-acetaminophen (NORCO/VICODIN) 5-325 MG tablet, Take 1 tablet by mouth every 8 (eight) hours as needed for moderate pain. (Patient not taking: Reported on 04/07/2017), Disp: 70 tablet, Rfl: 0 .  isosorbide mononitrate (IMDUR) 120 MG 24 hr tablet, Take 1 tablet (120 mg total) by mouth daily., Disp: 90 tablet, Rfl: 3 .  Multiple Vitamins-Minerals (CENTRUM SILVER 50+WOMEN) TABS, Take 1 tablet by mouth daily., Disp: , Rfl:  .  Olmesartan-Amlodipine-HCTZ 40-10-25 MG TABS, Take 1 tablet by mouth daily., Disp: 90 tablet, Rfl: 3 .  ranitidine (ZANTAC) 150 MG tablet, Take 1 tablet (150 mg total) by mouth 2 (two) times daily. If needed for reflux and heartburn, Disp: 180 tablet, Rfl: 3 .  triamcinolone cream (KENALOG) 0.1 %, Apply 1 application topically 2 (two) times daily., Disp: 30 g, Rfl: 0   Allergies:  Patient has no known allergies.  Review of Systems: Gen:  Denies  fever, sweats, chills HEENT: Denies blurred vision, double vision.  bleeds, sore throat Cvc:  No dizziness, chest pain. Resp:   Denies cough or sputum production, shortness of breath Gi: Denies swallowing difficulty, stomach pain. Gu:  Denies bladder incontinence, burning urine Ext:   No Joint pain, stiffness. Skin: No skin rash,  hives  Endoc:  No polyuria, polydipsia. Psych: No depression, insomnia. Other:  All other systems were reviewed with the patient and were negative other that what is mentioned in the HPI.   Physical Examination:   VS: BP (!) 142/82   Pulse 86   Resp 16   Ht 5' (1.524 m)   Wt 164  lb (74.4 kg)   SpO2 98%   BMI 32.03 kg/m   General Appearance: No distress  Neuro:without focal findings,  speech normal,  HEENT: PERRLA, EOM intact.  Mallampati 3 Pulmonary: normal breath sounds, No wheezing.  CardiovascularNormal S1,S2.  No m/r/g.   Abdomen: Benign, Soft, non-tender. Renal:  No costovertebral tenderness  GU:  No performed at this time. Endoc: No evident thyromegaly, no signs of acromegaly. Skin:   warm, no rashes, no ecchymosis  Extremities: normal, no cyanosis, clubbing.  Other findings:    LABORATORY PANEL:   CBC No results for input(s): WBC, HGB, HCT, PLT in the last 168 hours. ------------------------------------------------------------------------------------------------------------------  Chemistries  No results for input(s): NA, K, CL, CO2, GLUCOSE, BUN, CREATININE, CALCIUM, MG, AST, ALT, ALKPHOS, BILITOT in the last 168 hours.  Invalid input(s): GFRCGP ------------------------------------------------------------------------------------------------------------------  Cardiac Enzymes No results for input(s): TROPONINI in the last 168 hours. ------------------------------------------------------------  RADIOLOGY:  No results found.     Thank  you for the consultation and for allowing [facility] to assist in the care of your patient. Our recommendations are noted above.  Please contact us if we can be of further service.   Marda Stalker, MD.  Board Certified in Internal Medicine, Pulmonary Medicine, [place], and Sleep Medicine.  [facility] Office Number: [phone]  Patricia Pesa, M.D.  Merton Border, M.D  05/22/2017

## 2017-05-23 ENCOUNTER — Ambulatory Visit: Payer: Medicare Other | Admitting: Internal Medicine

## 2017-05-23 ENCOUNTER — Encounter: Payer: Self-pay | Admitting: Internal Medicine

## 2017-05-23 VITALS — BP 142/82 | HR 86 | Resp 16 | Ht 60.0 in | Wt 164.0 lb

## 2017-05-23 DIAGNOSIS — G4719 Other hypersomnia: Secondary | ICD-10-CM

## 2017-05-23 NOTE — Patient Instructions (Signed)
We will send for sleep study.

## 2017-06-07 ENCOUNTER — Ambulatory Visit: Payer: Medicare Other | Attending: Internal Medicine

## 2017-06-07 DIAGNOSIS — G4733 Obstructive sleep apnea (adult) (pediatric): Secondary | ICD-10-CM | POA: Insufficient documentation

## 2017-06-07 DIAGNOSIS — G4719 Other hypersomnia: Secondary | ICD-10-CM | POA: Insufficient documentation

## 2017-06-08 ENCOUNTER — Telehealth: Payer: Self-pay | Admitting: Family Medicine

## 2017-06-08 DIAGNOSIS — G4733 Obstructive sleep apnea (adult) (pediatric): Secondary | ICD-10-CM | POA: Diagnosis not present

## 2017-06-08 NOTE — Telephone Encounter (Signed)
Copied from [place] [phone]. Topic: Quick Communication - Rx Refill/Question >> Jun 08, 2017 10:45 AM Lennox Solders wrote: Medication: hydrocodone Has the patient contacted their pharmacy? no (Agent: If no, request that the patient contact the pharmacy for the refill.) Preferred Pharmacy (with phone number or street name): [facility] in [place]: Please be advised that RX refills may take up to 3 business days. We ask that you follow-up with your pharmacy.

## 2017-06-08 NOTE — Telephone Encounter (Signed)
Patient cannot get early refills of her pain medicines She was seen 03/23/17 and has an appt 06/20/17 I supplied 3 months of medicine at her 03/23/17 appointment She will have to wait until 06/20/17 to get next refill

## 2017-06-09 ENCOUNTER — Telehealth: Payer: Self-pay | Admitting: *Deleted

## 2017-06-09 DIAGNOSIS — G4733 Obstructive sleep apnea (adult) (pediatric): Secondary | ICD-10-CM

## 2017-06-09 NOTE — Telephone Encounter (Signed)
Left message to call back  

## 2017-06-12 NOTE — Telephone Encounter (Signed)
Left message to call back.  Next step titration study.

## 2017-06-12 NOTE — Telephone Encounter (Signed)
Patient returning call please call.

## 2017-06-12 NOTE — Telephone Encounter (Signed)
Pt aware titration study needed. Orders placed. Nothing further needed.

## 2017-06-13 ENCOUNTER — Encounter: Payer: Self-pay | Admitting: Family Medicine

## 2017-06-13 ENCOUNTER — Ambulatory Visit: Payer: Medicare Other | Admitting: Family Medicine

## 2017-06-13 VITALS — BP 124/64 | HR 71 | Temp 97.7°F | Resp 14 | Ht 63.0 in | Wt 165.7 lb

## 2017-06-13 DIAGNOSIS — H9193 Unspecified hearing loss, bilateral: Secondary | ICD-10-CM | POA: Diagnosis not present

## 2017-06-13 DIAGNOSIS — Z79899 Other long term (current) drug therapy: Secondary | ICD-10-CM

## 2017-06-13 DIAGNOSIS — R7301 Impaired fasting glucose: Secondary | ICD-10-CM

## 2017-06-13 DIAGNOSIS — M5136 Other intervertebral disc degeneration, lumbar region: Secondary | ICD-10-CM

## 2017-06-13 DIAGNOSIS — I10 Essential (primary) hypertension: Secondary | ICD-10-CM | POA: Diagnosis not present

## 2017-06-13 DIAGNOSIS — Z5181 Encounter for therapeutic drug level monitoring: Secondary | ICD-10-CM | POA: Diagnosis not present

## 2017-06-13 DIAGNOSIS — E782 Mixed hyperlipidemia: Secondary | ICD-10-CM | POA: Diagnosis not present

## 2017-06-13 MED ORDER — OLMESARTAN-AMLODIPINE-HCTZ 40-10-12.5 MG PO TABS: 1.0000 | ORAL_TABLET | Freq: Every day | ORAL | 3 refills | Status: DC

## 2017-06-13 NOTE — Patient Instructions (Addendum)
Try to stop cooking with salt Reduce the HCTZ in your combination pill to just 12.5; use the NEW bottle of olmesartan-amlodipine-HCTZ starting tomorrow Contact your pharmacy about your remaining refills of your pain medicine Try to lose another five pounds over the next 2-3 months Try to limit saturated fats in your diet (bologna, hot dogs, barbeque, cheeseburgers, hamburgers, steak, bacon, sausage, cheese, etc.) and get more fresh fruits, vegetables, and whole grains

## 2017-06-13 NOTE — Progress Notes (Signed)
BP 124/64   Pulse 71   Temp 97.7 F (36.5 C) (Oral)   Resp 14   Ht 5' 3"  (1.6 m)   Wt 165 lb 11.2 oz (75.2 kg)   SpO2 96%   BMI 29.35 kg/m    Subjective:    Patient ID: Jocelyn [name], female    DOB: January 22, 1932, 84 y.o.   MRN: [id number]  HPI: Jocelyn [name] is a 84 y.o. female  Chief Complaint  Patient presents with  . Follow-up  . Medication Refill    HPI Patient is here for f/u of chronic pain She requested pain medicine too early on Jun 08, 2017 Previous 3 prescriptions written at last appointment; supposed to be filled according to "fill on or after..." dates [place] / PMP Aware site reviewed She brought her bottles with her and this was reviewed She keeps her medicines locked up in her bedrooom; she locks her door to her bedroom She brought in an empty bottle from 03/27/2017; she says that is the only bottle she has had  Prediabetes; last A1c was 6.3 in January  HTN; taking medicine; she is staying away from salt for the most part; cooks with some salt though  Overweight; she does not want to lose any more weight; she is trying to eat better; she lost weight since her husband died; they both weighed 183 pounds before he came out of rehab; when he got home, she dropped down; she is eating better  High cholesterol; she says that she never started taking the cholesterol medicine; read about it  Functional Status Survey: Is the patient deaf or have difficulty hearing?: Yes Does the patient have difficulty seeing, even when wearing glasses/contacts?: Yes Does the patient have difficulty concentrating, remembering, or making decisions?: No Does the patient have difficulty walking or climbing stairs?: No Does the patient have difficulty dressing or bathing?: No Does the patient have difficulty doing errands alone such as visiting a doctor's office or shopping?: No  Difficulty hearing; has one hearing aid; was told she needs another; she has a doctor for that,  [facility]  Fall Risk  06/13/2017 04/07/2017 03/23/2017 02/03/2017 11/03/2016  Falls in the past year? No No No No No  Number falls in past yr: - - - - -  Injury with Fall? - - - - -     Depression screen [facility] 2/9 06/13/2017 04/07/2017 03/23/2017 02/03/2017 11/03/2016  Decreased Interest 0 0 0 0 0  Down, Depressed, Hopeless 1 0 0 1 0  PHQ - 2 Score 1 0 0 1 0    Relevant past medical, surgical, family and social history reviewed Past Medical History:  Diagnosis Date  . Arthritis of both knees 08/01/2015  . Essential hypertension, benign 07/28/2014  . GERD (gastroesophageal reflux disease)   . Hx of iron deficiency anemia 09/02/2015  . Hyperlipidemia   . Hypertension   . Lumbar pain   . OSA on CPAP 08/01/2015  . Overweight (BMI 25.0-29.9) 07/10/2013  . Prediabetes 09/02/2015   Past Surgical History:  Procedure Laterality Date  . ABDOMINAL HYSTERECTOMY    . CATARACT EXTRACTION    . CHOLECYSTECTOMY    . LUMBAR DISC SURGERY     Family History  Problem Relation Age of Onset  . Hypertension Daughter   . Diabetes Daughter   . Cancer Daughter        breast  . Diabetes Mother   . Heart disease Mother   . Cancer Father  lung; smoker  . Anuerysm Son    Social History   Tobacco Use  . Smoking status: Never Smoker  . Smokeless tobacco: Never Used  Substance Use Topics  . Alcohol use: No    Alcohol/week: 0.0 oz  . Drug use: No    Interim medical history since last visit reviewed. Allergies and medications reviewed  Review of Systems Per HPI unless specifically indicated above     Objective:    BP 124/64   Pulse 71   Temp 97.7 F (36.5 C) (Oral)   Resp 14   Ht 5' 3"  (1.6 m)   Wt 165 lb 11.2 oz (75.2 kg)   SpO2 96%   BMI 29.35 kg/m   Wt Readings from Last 3 Encounters:  06/13/17 165 lb 11.2 oz (75.2 kg)  05/23/17 164 lb (74.4 kg)  04/07/17 169 lb 14.4 oz (77.1 kg)    Physical Exam  Constitutional: She appears well-developed and well-nourished.  Elderly female,  poorly conditioned; overweight, nontoxic  HENT:  Mouth/Throat: Mucous membranes are normal.  Eyes: EOM are normal. No scleral icterus.  Cardiovascular: Normal rate and regular rhythm.  Pulmonary/Chest: Effort normal and breath sounds normal.  Psychiatric: She has a normal mood and affect. Her behavior is normal.    Results for orders placed or performed in visit on 02/03/17  Hemoglobin A1c  Result Value Ref Range   Hgb A1c MFr Bld 6.3 (H) <5.7 % of total Hgb   Mean Plasma Glucose 134 (calc)   eAG (mmol/L) 7.4 (calc)  Basic metabolic panel  Result Value Ref Range   Glucose, Bld 111 (H) 65 - 99 mg/dL   BUN 15 7 - 25 mg/dL   Creat 0.89 (H) 0.60 - 0.88 mg/dL   BUN/Creatinine Ratio 17 6 - 22 (calc)   Sodium 138 135 - 146 mmol/L   Potassium 4.0 3.5 - 5.3 mmol/L   Chloride 102 98 - 110 mmol/L   CO2 30 20 - 32 mmol/L   Calcium 9.3 8.6 - 10.4 mg/dL  TSH  Result Value Ref Range   TSH 0.76 0.40 - 4.50 mIU/L  CBC with Differential/Platelet  Result Value Ref Range   WBC 6.7 3.8 - 10.8 Thousand/uL   RBC 4.36 3.80 - 5.10 Million/uL   Hemoglobin 11.7 11.7 - 15.5 g/dL   HCT 35.7 35.0 - 45.0 %   MCV 81.9 80.0 - 100.0 fL   MCH 26.8 (L) 27.0 - 33.0 pg   MCHC 32.8 32.0 - 36.0 g/dL   RDW 12.7 11.0 - 15.0 %   Platelets 239 140 - 400 Thousand/uL   MPV 11.1 7.5 - 12.5 fL   Neutro Abs 2,787 1,500 - 7,800 cells/uL   Lymphs Abs 3,283 850 - 3,900 cells/uL   WBC mixed population 442 200 - 950 cells/uL   Eosinophils Absolute 134 15 - 500 cells/uL   Basophils Absolute 54 0 - 200 cells/uL   Neutrophils Relative % 41.6 %   Total Lymphocyte 49.0 %   Monocytes Relative 6.6 %   Eosinophils Relative 2.0 %   Basophils Relative 0.8 %  Urine Drug Screen w/Alc, no confirm  Result Value Ref Range   Please note     AMPHETAMINES (1000 ng/mL SCRN) NEGATIVE    BARBITURATES NEGATIVE    BENZODIAZEPINES NEGATIVE    COCAINE METABOLITES NEGATIVE    MARIJUANA MET (50 ng/mL SCRN) NEGATIVE    METHADONE NEGATIVE     METHAQUALONE NEGATIVE    OPIATES POSITIVE (A)  PHENCYCLIDINE NEGATIVE    PROPOXYPHENE NEGATIVE    ALCOHOL, ETHYL (U) NEGATIVE       Assessment & Plan:   Problem List Items Addressed This Visit      Cardiovascular and Mediastinum   Essential hypertension, benign (Chronic)    Continue medicine; refill provided      Relevant Medications   Olmesartan-amLODIPine-HCTZ 40-10-12.5 MG TABS     Endocrine   Impaired fasting glucose   Relevant Orders   Hemoglobin A1c     Nervous and Auditory   Hearing loss - Primary     Musculoskeletal and Integument   DDD (degenerative disc disease), lumbar    Patient should have remaining pain medicine prescriptions that have yet to be filled; no new prescriptions written today; reviewed PMP Aware site; she will contact pharmacy        Other   Medication monitoring encounter   Relevant Orders   COMPLETE METABOLIC PANEL WITH GFR   HLD (hyperlipidemia)    Patient read about the cholesterol lowering agent and opted to not start      Relevant Medications   Olmesartan-amLODIPine-HCTZ 40-10-12.5 MG TABS   Other Relevant Orders   Lipid panel   Controlled substance agreement signed    Patient may not get early prescriptions; PMP Aware site reviewed; discussed with her; she should have prescriptions yet to be filled          Follow up plan: Return in about 7 weeks (around 08/04/2017) for twenty minute follow-up with fasting labs (or just after); TWO weeks with CMA for blood pressure.  An after-visit summary was printed and given to the patient at [place].  Please see the patient instructions which may contain other information and recommendations beyond what is mentioned above in the assessment and plan.  Meds ordered this encounter  Medications  . Olmesartan-amLODIPine-HCTZ 40-10-12.5 MG TABS    Sig: Take 1 tablet by mouth daily. For blood pressure    Dispense:  90 tablet    Refill:  3    Reducing the HCTZ component    Orders  Placed This Encounter  Procedures  . COMPLETE METABOLIC PANEL WITH GFR  . Hemoglobin A1c  . Lipid panel

## 2017-06-20 ENCOUNTER — Ambulatory Visit: Payer: Medicare Other | Admitting: Family Medicine

## 2017-06-23 ENCOUNTER — Ambulatory Visit: Payer: Medicare Other | Attending: Internal Medicine

## 2017-06-23 DIAGNOSIS — G4733 Obstructive sleep apnea (adult) (pediatric): Secondary | ICD-10-CM | POA: Insufficient documentation

## 2017-06-26 NOTE — Assessment & Plan Note (Signed)
Patient may not get early prescriptions; PMP Aware site reviewed; discussed with her; she should have prescriptions yet to be filled

## 2017-06-26 NOTE — Assessment & Plan Note (Addendum)
Decrease thiazide component, reduce salt in diet; refill provided

## 2017-06-26 NOTE — Assessment & Plan Note (Signed)
Patient should have remaining pain medicine prescriptions that have yet to be filled; no new prescriptions written today; reviewed PMP Aware site; she will contact pharmacy

## 2017-06-26 NOTE — Assessment & Plan Note (Signed)
Patient read about the cholesterol lowering agent and opted to not start

## 2017-06-27 ENCOUNTER — Ambulatory Visit: Payer: Medicare Other

## 2017-06-27 VITALS — BP 122/62 | HR 66 | Wt 167.0 lb

## 2017-06-27 DIAGNOSIS — G4733 Obstructive sleep apnea (adult) (pediatric): Secondary | ICD-10-CM | POA: Diagnosis not present

## 2017-06-27 DIAGNOSIS — I10 Essential (primary) hypertension: Secondary | ICD-10-CM

## 2017-06-27 NOTE — Progress Notes (Signed)
Excellent BP; thank you No changes

## 2017-06-29 ENCOUNTER — Telehealth: Payer: Self-pay | Admitting: *Deleted

## 2017-06-29 DIAGNOSIS — G4733 Obstructive sleep apnea (adult) (pediatric): Secondary | ICD-10-CM

## 2017-06-29 NOTE — Telephone Encounter (Signed)
Called with results of sleep study. She is aware orders have been placed. Nothing further needed.

## 2017-07-01 ENCOUNTER — Other Ambulatory Visit: Payer: Self-pay | Admitting: Family Medicine

## 2017-07-27 NOTE — Telephone Encounter (Signed)
I do not have security to close this old encounter. Please close

## 2017-08-04 ENCOUNTER — Ambulatory Visit: Payer: Medicare Other | Admitting: Family Medicine

## 2017-08-05 ENCOUNTER — Other Ambulatory Visit: Payer: Self-pay | Admitting: Family Medicine

## 2017-08-11 DIAGNOSIS — M170 Bilateral primary osteoarthritis of knee: Secondary | ICD-10-CM | POA: Diagnosis not present

## 2017-09-11 DIAGNOSIS — G4733 Obstructive sleep apnea (adult) (pediatric): Secondary | ICD-10-CM | POA: Diagnosis not present

## 2017-10-05 NOTE — Telephone Encounter (Signed)
Pt is returning your call

## 2017-10-05 NOTE — Telephone Encounter (Signed)
Informed pt we haven't called her since May. Pt verbalized understanding. nothing further needed.

## 2017-10-12 DIAGNOSIS — G4733 Obstructive sleep apnea (adult) (pediatric): Secondary | ICD-10-CM | POA: Diagnosis not present

## 2017-10-13 DIAGNOSIS — G4733 Obstructive sleep apnea (adult) (pediatric): Secondary | ICD-10-CM | POA: Diagnosis not present

## 2017-10-29 ENCOUNTER — Other Ambulatory Visit: Payer: Self-pay | Admitting: Family Medicine

## 2017-11-11 DIAGNOSIS — G4733 Obstructive sleep apnea (adult) (pediatric): Secondary | ICD-10-CM | POA: Diagnosis not present

## 2017-11-25 ENCOUNTER — Other Ambulatory Visit: Payer: Self-pay | Admitting: Family Medicine

## 2017-11-27 NOTE — Telephone Encounter (Signed)
Patient is well overdue for labs I'll give limited Rx

## 2017-11-28 ENCOUNTER — Ambulatory Visit (INDEPENDENT_AMBULATORY_CARE_PROVIDER_SITE_OTHER): Payer: Medicare Other

## 2017-11-28 DIAGNOSIS — Z23 Encounter for immunization: Secondary | ICD-10-CM | POA: Diagnosis not present

## 2017-12-05 ENCOUNTER — Other Ambulatory Visit: Payer: Self-pay | Admitting: Family Medicine

## 2017-12-05 NOTE — Telephone Encounter (Signed)
Spoke with patient and she scheduled an appt with Dr Sanda Klein for 11.8.19

## 2017-12-05 NOTE — Telephone Encounter (Signed)
Controlled substance requested She needs an appointment for this Thank you

## 2017-12-05 NOTE — Telephone Encounter (Signed)
Refill request was sent to Dr. Melinda Lada for approval and submission.  

## 2017-12-05 NOTE — Telephone Encounter (Unsigned)
Copied from [place] [id number]. Topic: Quick Communication - Rx Refill/Question >> Dec 05, 2017  9:30 AM Judyann Munson wrote: Medication:  HYDROcodone-acetaminophen (NORCO/VICODIN) 5-325 MG tablet   Has the patient contacted their pharmacy? Yes   Preferred Pharmacy (with phone number or street name):   [id number] [address], [place] [phone] (Phone) [phone] (Fax)    Agent: Please be advised that RX refills may take up to 3 business days. We ask that you follow-up with your pharmacy.

## 2017-12-08 ENCOUNTER — Encounter: Payer: Self-pay | Admitting: Family Medicine

## 2017-12-08 ENCOUNTER — Ambulatory Visit (INDEPENDENT_AMBULATORY_CARE_PROVIDER_SITE_OTHER): Payer: Medicare Other | Admitting: Family Medicine

## 2017-12-08 VITALS — BP 120/60 | HR 89 | Temp 98.7°F | Ht 63.0 in | Wt 166.3 lb

## 2017-12-08 DIAGNOSIS — M51369 Other intervertebral disc degeneration, lumbar region without mention of lumbar back pain or lower extremity pain: Secondary | ICD-10-CM

## 2017-12-08 DIAGNOSIS — E782 Mixed hyperlipidemia: Secondary | ICD-10-CM | POA: Diagnosis not present

## 2017-12-08 DIAGNOSIS — M5136 Other intervertebral disc degeneration, lumbar region: Secondary | ICD-10-CM

## 2017-12-08 DIAGNOSIS — Z5181 Encounter for therapeutic drug level monitoring: Secondary | ICD-10-CM | POA: Diagnosis not present

## 2017-12-08 DIAGNOSIS — M170 Bilateral primary osteoarthritis of knee: Secondary | ICD-10-CM | POA: Diagnosis not present

## 2017-12-08 DIAGNOSIS — Z79899 Other long term (current) drug therapy: Secondary | ICD-10-CM

## 2017-12-08 DIAGNOSIS — I10 Essential (primary) hypertension: Secondary | ICD-10-CM

## 2017-12-08 DIAGNOSIS — R7301 Impaired fasting glucose: Secondary | ICD-10-CM | POA: Diagnosis not present

## 2017-12-08 DIAGNOSIS — K219 Gastro-esophageal reflux disease without esophagitis: Secondary | ICD-10-CM

## 2017-12-08 MED ORDER — HYDROCODONE-ACETAMINOPHEN 5-325 MG PO TABS: 1.0000 | ORAL_TABLET | Freq: Three times a day (TID) | ORAL | 0 refills | Status: DC | PRN

## 2017-12-08 MED ORDER — FAMOTIDINE 20 MG PO TABS: 20.0000 mg | ORAL_TABLET | Freq: Two times a day (BID) | ORAL | 2 refills | Status: DC

## 2017-12-08 NOTE — Progress Notes (Signed)
BP 120/60   Pulse 89   Temp 98.7 F (37.1 C) (Oral)   Ht _0  (1.6 m)   Wt 166 lb 4.8 oz (75.4 kg)   SpO2 95%   BMI 29.46 kg/m    Subjective:    Patient ID: Jocelyn [name], female    DOB: [birth year], 84 y.o.   MRN: [id number]  HPI: Jocelyn [name] is a 84 y.o. female  Chief Complaint  Patient presents with  . Follow-up  . Medication Refill    HPI Here for f/u  She has excellent blood pressure today; on meds for HTN  High cholesterol; she is not taking her cholesterol medicine; decided to not take it any more; she is trying to eat a good diet; labs were ordered in May for July and she did not have those done; she does eat fried chicken; "I love fried chicken"  She has chronic pain, knees and lower back; took a pain pill this morning; last Rx filled in July; that was for 70 pills; she might take one a day if it gets bad; she has voltaren gel on her med list but says she is not taking it  She needs something for heartburn; ranitidine has been recalled  She has hearing difficulty; she has been dealing with someone about getting a hearing aid  Prediabetes; overdue for A1c; sometimes gets dry mouth;   Depression screen [facility] 2/9 12/08/2017 06/13/2017 04/07/2017 03/23/2017 02/03/2017  Decreased Interest 0 0 0 0 0  Down, Depressed, Hopeless 0 1 0 0 1  PHQ - 2 Score 0 1 0 0 1  Altered sleeping 0 - - - -  Tired, decreased energy 0 - - - -  Change in appetite 0 - - - -  Feeling bad or failure about yourself  0 - - - -  Trouble concentrating 0 - - - -  Moving slowly or fidgety/restless 0 - - - -  Suicidal thoughts 0 - - - -  PHQ-9 Score 0 - - - -  Difficult doing work/chores Not difficult at all - - - -   Fall Risk  12/08/2017 06/13/2017 04/07/2017 03/23/2017 02/03/2017  Falls in the past year? 0 No No No No  Number falls in past yr: 0 - - - -  Injury with Fall? 0 - - - -    Relevant past medical, surgical, family and social history reviewed Past Medical History:  Diagnosis Date   . Arthritis of both knees 08/01/2015  . Essential hypertension, benign 07/28/2014  . GERD (gastroesophageal reflux disease)   . Hx of iron deficiency anemia 09/02/2015  . Hyperlipidemia   . Hypertension   . Lumbar pain   . OSA on CPAP 08/01/2015  . Overweight (BMI 25.0-29.9) 07/10/2013  . Prediabetes 09/02/2015   Past Surgical History:  Procedure Laterality Date  . ABDOMINAL HYSTERECTOMY    . CATARACT EXTRACTION    . CHOLECYSTECTOMY    . LUMBAR DISC SURGERY     Family History  Problem Relation Age of Onset  . Hypertension Daughter   . Diabetes Daughter   . Cancer Daughter        breast  . Diabetes Mother   . Heart disease Mother   . Cancer Father        lung; smoker  . Anuerysm Son    Social History   Tobacco Use  . Smoking status: Never Smoker  . Smokeless tobacco: Never Used  Substance Use Topics  . Alcohol  use: No    Alcohol/week: 0.0 standard drinks  . Drug use: No     Office Visit from 12/08/2017 [facility]  AUDIT-C Score  0      Interim medical history since last visit reviewed. Allergies and medications reviewed  Review of Systems  HENT: Positive for hearing loss.   Gastrointestinal: Negative for blood in stool.  Genitourinary: Negative for hematuria.  Musculoskeletal: Positive for arthralgias and back pain.   Per HPI unless specifically indicated above     Objective:    BP 120/60   Pulse 89   Temp 98.7 F (37.1 C) (Oral)   Ht _0  (1.6 m)   Wt 166 lb 4.8 oz (75.4 kg)   SpO2 95%   BMI 29.46 kg/m   Wt Readings from Last 3 Encounters:  12/08/17 166 lb 4.8 oz (75.4 kg)  06/27/17 167 lb (75.8 kg)  06/13/17 165 lb 11.2 oz (75.2 kg)    Physical Exam  Constitutional: She appears well-developed and well-nourished. No distress.  HENT:  Head: Normocephalic and atraumatic.  Eyes: EOM are normal. No scleral icterus.  Neck: No thyromegaly present.  Cardiovascular: Normal rate, regular rhythm and normal heart sounds.  No murmur  heard. Pulmonary/Chest: Effort normal and breath sounds normal. No respiratory distress. She has no wheezes.  Abdominal: Soft. Bowel sounds are normal. She exhibits no distension.  Musculoskeletal: She exhibits no edema.       Right knee: She exhibits decreased range of motion, swelling and deformity.  Knees are enlarged; deformed; crepitus  Neurological: She is alert.  Ambulatory with rolling walker  Skin: Skin is warm and dry. She is not diaphoretic. No pallor.  Psychiatric: She has a normal mood and affect. Her behavior is normal. Judgment and thought content normal.    Results for orders placed or performed in visit on 02/03/17  Hemoglobin A1c  Result Value Ref Range   Hgb A1c MFr Bld 6.3 (H) <5.7 % of total Hgb   Mean Plasma Glucose 134 (calc)   eAG (mmol/L) 7.4 (calc)  Basic metabolic panel  Result Value Ref Range   Glucose, Bld 111 (H) 65 - 99 mg/dL   BUN 15 7 - 25 mg/dL   Creat 0.89 (H) 0.60 - 0.88 mg/dL   BUN/Creatinine Ratio 17 6 - 22 (calc)   Sodium 138 135 - 146 mmol/L   Potassium 4.0 3.5 - 5.3 mmol/L   Chloride 102 98 - 110 mmol/L   CO2 30 20 - 32 mmol/L   Calcium 9.3 8.6 - 10.4 mg/dL  TSH  Result Value Ref Range   TSH 0.76 0.40 - 4.50 mIU/L  CBC with Differential/Platelet  Result Value Ref Range   WBC 6.7 3.8 - 10.8 Thousand/uL   RBC 4.36 3.80 - 5.10 Million/uL   Hemoglobin 11.7 11.7 - 15.5 g/dL   HCT 35.7 35.0 - 45.0 %   MCV 81.9 80.0 - 100.0 fL   MCH 26.8 (L) 27.0 - 33.0 pg   MCHC 32.8 32.0 - 36.0 g/dL   RDW 12.7 11.0 - 15.0 %   Platelets 239 140 - 400 Thousand/uL   MPV 11.1 7.5 - 12.5 fL   Neutro Abs 2,787 1,500 - 7,800 cells/uL   Lymphs Abs 3,283 850 - 3,900 cells/uL   WBC mixed population 442 200 - 950 cells/uL   Eosinophils Absolute 134 15 - 500 cells/uL   Basophils Absolute 54 0 - 200 cells/uL   Neutrophils Relative % 41.6 %  Total Lymphocyte 49.0 %   Monocytes Relative 6.6 %   Eosinophils Relative 2.0 %   Basophils Relative 0.8 %  Urine  Drug Screen w/Alc, no confirm  Result Value Ref Range   Please note     AMPHETAMINES (1000 ng/mL SCRN) NEGATIVE    BARBITURATES NEGATIVE    BENZODIAZEPINES NEGATIVE    COCAINE METABOLITES NEGATIVE    MARIJUANA MET (50 ng/mL SCRN) NEGATIVE    METHADONE NEGATIVE    METHAQUALONE NEGATIVE    OPIATES POSITIVE (A)    PHENCYCLIDINE NEGATIVE    PROPOXYPHENE NEGATIVE    ALCOHOL, ETHYL (U) NEGATIVE       Assessment & Plan:   Problem List Items Addressed This Visit      Cardiovascular and Mediastinum   Essential hypertension, benign (Chronic)    Controlled today; check Cr        Digestive   Gastroesophageal reflux disease without esophagitis    Stop ranitidine; start famotidine instead      Relevant Medications   famotidine (PEPCID) 20 MG tablet     Endocrine   Impaired fasting glucose    Check A1c today; try to lose about 10 pounds over the next 6-12 months      Relevant Orders   Hemoglobin A1c     Musculoskeletal and Integument   DDD (degenerative disc disease), lumbar    Continue pain management here; can refer in the future to pain clinic if needed      Relevant Medications   HYDROcodone-acetaminophen (NORCO/VICODIN) 5-325 MG tablet   Arthritis of both knees    Use voltaren gel; hydrocodone PRN; using walker      Relevant Medications   HYDROcodone-acetaminophen (NORCO/VICODIN) 5-325 MG tablet     Other   Medication monitoring encounter    Check liver and kidneys      Relevant Orders   Urine Drug Screen w/Alc, no confirm   COMPLETE METABOLIC PANEL WITH GFR   HLD (hyperlipidemia)    Check lipids today; patient is not on a statin; limit saturated fats      Relevant Orders   Lipid panel   Controlled substance agreement signed - Primary    New agreement today; reviewed PMP Aware today      Relevant Orders   Urine Drug Screen w/Alc, no confirm       Follow up plan: Return in about 3 months (around 03/10/2018).  An after-visit summary was printed and  given to the patient at [place] [facility].  Please see the patient instructions which may contain other information and recommendations beyond what is mentioned above in the assessment and plan.  Meds ordered this encounter  Medications  . HYDROcodone-acetaminophen (NORCO/VICODIN) 5-325 MG tablet    Sig: Take 1 tablet by mouth every 8 (eight) hours as needed for moderate pain.    Dispense:  30 tablet    Refill:  0    Controlled substance contract; CHRONIC pain; #1  . famotidine (PEPCID) 20 MG tablet    Sig: Take 1 tablet (20 mg total) by mouth 2 (two) times daily. If needed for heartburn; do not take ranitidine    Dispense:  60 tablet    Refill:  2    Orders Placed This Encounter  Procedures  . Urine Drug Screen w/Alc, no confirm  . COMPLETE METABOLIC PANEL WITH GFR  . Hemoglobin A1c  . Lipid panel

## 2017-12-08 NOTE — Assessment & Plan Note (Addendum)
Check A1c today; try to lose about 10 pounds over the next 6-12 months

## 2017-12-08 NOTE — Assessment & Plan Note (Signed)
Check liver and kidneys 

## 2017-12-08 NOTE — Assessment & Plan Note (Signed)
Controlled today; check Cr

## 2017-12-08 NOTE — Assessment & Plan Note (Signed)
Continue pain management here; can refer in the future to pain clinic if needed

## 2017-12-08 NOTE — Assessment & Plan Note (Signed)
Stop ranitidine; start famotidine instead

## 2017-12-08 NOTE — Assessment & Plan Note (Signed)
New agreement today; reviewed PMP Aware today

## 2017-12-08 NOTE — Assessment & Plan Note (Signed)
Check lipids today; patient is not on a statin; limit saturated fats

## 2017-12-08 NOTE — Assessment & Plan Note (Signed)
Use voltaren gel; hydrocodone PRN; using walker

## 2017-12-08 NOTE — Patient Instructions (Addendum)
Use the voltaren gel up to four times a day for your knee pain Use the hydrocodone as sparingly as possible Let's get labs today STOP ranitidine Start pepcid (famotidine) for heartburn instead  Check out the information at familydoctor.org entitled "Nutrition for Weight Loss: What You Need to Know about Fad Diets" Try to lose between 1-2 pounds per week by taking in fewer calories and burning off more calories You can succeed by limiting portions, limiting foods dense in calories and fat, becoming more active, and drinking 8 glasses of water a day (64 ounces) Don't skip meals, especially breakfast, as skipping meals may alter your metabolism Do not use over-the-counter weight loss pills or gimmicks that claim rapid weight loss A healthy BMI (or body mass index) is between 18.5 and 24.9 You can calculate your ideal BMI at the [place] website [employer]fr  Try to limit saturated fats in your diet (bologna, hot dogs, barbeque, cheeseburgers, hamburgers, steak, bacon, sausage, cheese, etc.) and get more fresh fruits, vegetables, and whole grains

## 2017-12-09 LAB — DRUG SCREEN URINE W/ALC, NO CONF
ALCOHOL, ETHYL (U): NEGATIVE
AMPHETAMINES (1000 ng/mL SCRN): NEGATIVE
BARBITURATES: NEGATIVE
BENZODIAZEPINES: NEGATIVE
COCAINE METABOLITES: NEGATIVE
MARIJUANA MET (50 NG/ML SCRN): NEGATIVE
METHADONE: NEGATIVE
METHAQUALONE: NEGATIVE
OPIATES: POSITIVE — AB
PHENCYCLIDINE: NEGATIVE
PROPOXYPHENE: NEGATIVE

## 2017-12-09 LAB — COMPLETE METABOLIC PANEL WITH GFR
AG Ratio: 1.3 (calc) (ref 1.0–2.5)
ALBUMIN MSPROF: 4 g/dL (ref 3.6–5.1)
ALKALINE PHOSPHATASE (APISO): 47 U/L (ref 33–130)
ALT: 7 U/L (ref 6–29)
AST: 16 U/L (ref 10–35)
BUN / CREAT RATIO: 33 (calc) — AB (ref 6–22)
BUN: 26 mg/dL — ABNORMAL HIGH (ref 7–25)
CHLORIDE: 103 mmol/L (ref 98–110)
CO2: 27 mmol/L (ref 20–32)
Calcium: 9.4 mg/dL (ref 8.6–10.4)
Creat: 0.78 mg/dL (ref 0.60–0.88)
GFR, Est African American: 81 mL/min/{1.73_m2} (ref >=60)
GFR, Est Non African American: 70 mL/min/{1.73_m2} (ref >=60)
GLOBULIN: 3.2 g/dL (ref 1.9–3.7)
Glucose, Bld: 85 mg/dL (ref 65–99)
Potassium: 3.4 mmol/L — ABNORMAL LOW (ref 3.5–5.3)
SODIUM: 138 mmol/L (ref 135–146)
Total Bilirubin: 0.4 mg/dL (ref 0.2–1.2)
Total Protein: 7.2 g/dL (ref 6.1–8.1)

## 2017-12-09 LAB — HEMOGLOBIN A1C
HEMOGLOBIN A1C: 6.1 %{Hb} — AB (ref <5.7)
MEAN PLASMA GLUCOSE: 128 (calc)
eAG (mmol/L): 7.1 (calc)

## 2017-12-09 LAB — LIPID PANEL
CHOL/HDL RATIO: 3.9 (calc) (ref <5.0)
Cholesterol: 208 mg/dL — ABNORMAL HIGH (ref <200)
HDL: 54 mg/dL (ref >50)
LDL Cholesterol (Calc): 132 mg/dL (calc) — ABNORMAL HIGH
Non-HDL Cholesterol (Calc): 154 mg/dL (calc) — ABNORMAL HIGH (ref <130)
Triglycerides: 116 mg/dL (ref <150)

## 2017-12-15 ENCOUNTER — Other Ambulatory Visit: Payer: Self-pay | Admitting: Family Medicine

## 2017-12-15 MED ORDER — PRAVASTATIN SODIUM 20 MG PO TABS: ORAL_TABLET | ORAL | 1 refills | Status: DC

## 2017-12-15 NOTE — Progress Notes (Signed)
Patient did not want to take statin She does not want lipitor at all Dr. Rutherford Nail had her on pravastatin She agrees to start back on lower dose a couple of nights each week New rx sent

## 2017-12-30 DIAGNOSIS — R509 Fever, unspecified: Secondary | ICD-10-CM | POA: Diagnosis not present

## 2017-12-30 DIAGNOSIS — K219 Gastro-esophageal reflux disease without esophagitis: Secondary | ICD-10-CM | POA: Diagnosis not present

## 2017-12-30 DIAGNOSIS — K55049 Acute infarction of large intestine, extent unspecified: Secondary | ICD-10-CM | POA: Diagnosis not present

## 2017-12-30 DIAGNOSIS — M159 Polyosteoarthritis, unspecified: Secondary | ICD-10-CM | POA: Diagnosis not present

## 2017-12-30 DIAGNOSIS — K529 Noninfective gastroenteritis and colitis, unspecified: Secondary | ICD-10-CM | POA: Insufficient documentation

## 2017-12-30 DIAGNOSIS — M4186 Other forms of scoliosis, lumbar region: Secondary | ICD-10-CM | POA: Diagnosis not present

## 2017-12-30 DIAGNOSIS — D72829 Elevated white blood cell count, unspecified: Secondary | ICD-10-CM | POA: Diagnosis not present

## 2017-12-30 DIAGNOSIS — I10 Essential (primary) hypertension: Secondary | ICD-10-CM | POA: Diagnosis not present

## 2017-12-30 DIAGNOSIS — E860 Dehydration: Secondary | ICD-10-CM | POA: Diagnosis not present

## 2017-12-30 DIAGNOSIS — G8929 Other chronic pain: Secondary | ICD-10-CM | POA: Diagnosis not present

## 2017-12-30 DIAGNOSIS — R933 Abnormal findings on diagnostic imaging of other parts of digestive tract: Secondary | ICD-10-CM | POA: Diagnosis not present

## 2017-12-30 DIAGNOSIS — I4949 Other premature depolarization: Secondary | ICD-10-CM | POA: Diagnosis not present

## 2017-12-30 DIAGNOSIS — R1031 Right lower quadrant pain: Secondary | ICD-10-CM | POA: Diagnosis not present

## 2017-12-30 DIAGNOSIS — M5136 Other intervertebral disc degeneration, lumbar region: Secondary | ICD-10-CM | POA: Diagnosis not present

## 2017-12-30 DIAGNOSIS — K5289 Other specified noninfective gastroenteritis and colitis: Secondary | ICD-10-CM | POA: Diagnosis not present

## 2017-12-30 DIAGNOSIS — K5900 Constipation, unspecified: Secondary | ICD-10-CM | POA: Diagnosis not present

## 2017-12-30 DIAGNOSIS — G4733 Obstructive sleep apnea (adult) (pediatric): Secondary | ICD-10-CM | POA: Diagnosis not present

## 2017-12-30 DIAGNOSIS — Z9049 Acquired absence of other specified parts of digestive tract: Secondary | ICD-10-CM | POA: Diagnosis not present

## 2017-12-30 DIAGNOSIS — I7090 Unspecified atherosclerosis: Secondary | ICD-10-CM | POA: Diagnosis not present

## 2017-12-30 DIAGNOSIS — D490 Neoplasm of unspecified behavior of digestive system: Secondary | ICD-10-CM | POA: Diagnosis not present

## 2017-12-30 DIAGNOSIS — M25511 Pain in right shoulder: Secondary | ICD-10-CM | POA: Diagnosis not present

## 2017-12-30 DIAGNOSIS — J9811 Atelectasis: Secondary | ICD-10-CM | POA: Diagnosis not present

## 2017-12-30 DIAGNOSIS — Z862 Personal history of diseases of the blood and blood-forming organs and certain disorders involving the immune mechanism: Secondary | ICD-10-CM | POA: Diagnosis not present

## 2017-12-30 DIAGNOSIS — R1110 Vomiting, unspecified: Secondary | ICD-10-CM | POA: Diagnosis not present

## 2017-12-30 DIAGNOSIS — H9190 Unspecified hearing loss, unspecified ear: Secondary | ICD-10-CM | POA: Diagnosis not present

## 2017-12-30 DIAGNOSIS — K6389 Other specified diseases of intestine: Secondary | ICD-10-CM | POA: Diagnosis not present

## 2017-12-30 DIAGNOSIS — Z66 Do not resuscitate: Secondary | ICD-10-CM | POA: Diagnosis not present

## 2017-12-30 DIAGNOSIS — N179 Acute kidney failure, unspecified: Secondary | ICD-10-CM | POA: Diagnosis not present

## 2017-12-30 DIAGNOSIS — D509 Iron deficiency anemia, unspecified: Secondary | ICD-10-CM | POA: Diagnosis not present

## 2017-12-30 DIAGNOSIS — K633 Ulcer of intestine: Secondary | ICD-10-CM | POA: Diagnosis not present

## 2017-12-30 DIAGNOSIS — K559 Vascular disorder of intestine, unspecified: Secondary | ICD-10-CM | POA: Diagnosis not present

## 2017-12-30 DIAGNOSIS — E785 Hyperlipidemia, unspecified: Secondary | ICD-10-CM | POA: Diagnosis not present

## 2017-12-30 DIAGNOSIS — M19011 Primary osteoarthritis, right shoulder: Secondary | ICD-10-CM | POA: Diagnosis not present

## 2017-12-30 DIAGNOSIS — D696 Thrombocytopenia, unspecified: Secondary | ICD-10-CM | POA: Diagnosis not present

## 2017-12-30 DIAGNOSIS — K5730 Diverticulosis of large intestine without perforation or abscess without bleeding: Secondary | ICD-10-CM | POA: Diagnosis not present

## 2018-01-05 DIAGNOSIS — H9190 Unspecified hearing loss, unspecified ear: Secondary | ICD-10-CM | POA: Diagnosis not present

## 2018-01-05 DIAGNOSIS — Z9181 History of falling: Secondary | ICD-10-CM | POA: Diagnosis not present

## 2018-01-05 DIAGNOSIS — M1712 Unilateral primary osteoarthritis, left knee: Secondary | ICD-10-CM | POA: Diagnosis not present

## 2018-01-05 DIAGNOSIS — Z7982 Long term (current) use of aspirin: Secondary | ICD-10-CM | POA: Diagnosis not present

## 2018-01-05 DIAGNOSIS — K559 Vascular disorder of intestine, unspecified: Secondary | ICD-10-CM | POA: Diagnosis not present

## 2018-01-05 DIAGNOSIS — I10 Essential (primary) hypertension: Secondary | ICD-10-CM | POA: Diagnosis not present

## 2018-01-05 DIAGNOSIS — D509 Iron deficiency anemia, unspecified: Secondary | ICD-10-CM | POA: Diagnosis not present

## 2018-01-05 DIAGNOSIS — M1711 Unilateral primary osteoarthritis, right knee: Secondary | ICD-10-CM | POA: Diagnosis not present

## 2018-01-05 DIAGNOSIS — Z79891 Long term (current) use of opiate analgesic: Secondary | ICD-10-CM | POA: Diagnosis not present

## 2018-01-05 DIAGNOSIS — E785 Hyperlipidemia, unspecified: Secondary | ICD-10-CM | POA: Diagnosis not present

## 2018-01-05 DIAGNOSIS — M5136 Other intervertebral disc degeneration, lumbar region: Secondary | ICD-10-CM | POA: Diagnosis not present

## 2018-01-05 DIAGNOSIS — G4733 Obstructive sleep apnea (adult) (pediatric): Secondary | ICD-10-CM | POA: Diagnosis not present

## 2018-01-05 DIAGNOSIS — Z792 Long term (current) use of antibiotics: Secondary | ICD-10-CM | POA: Diagnosis not present

## 2018-01-05 DIAGNOSIS — M4186 Other forms of scoliosis, lumbar region: Secondary | ICD-10-CM | POA: Diagnosis not present

## 2018-01-05 DIAGNOSIS — K5730 Diverticulosis of large intestine without perforation or abscess without bleeding: Secondary | ICD-10-CM | POA: Diagnosis not present

## 2018-01-10 DIAGNOSIS — G4733 Obstructive sleep apnea (adult) (pediatric): Secondary | ICD-10-CM | POA: Diagnosis not present

## 2018-01-10 DIAGNOSIS — Z7982 Long term (current) use of aspirin: Secondary | ICD-10-CM | POA: Diagnosis not present

## 2018-01-10 DIAGNOSIS — M1712 Unilateral primary osteoarthritis, left knee: Secondary | ICD-10-CM | POA: Diagnosis not present

## 2018-01-10 DIAGNOSIS — D509 Iron deficiency anemia, unspecified: Secondary | ICD-10-CM | POA: Diagnosis not present

## 2018-01-10 DIAGNOSIS — K559 Vascular disorder of intestine, unspecified: Secondary | ICD-10-CM | POA: Diagnosis not present

## 2018-01-10 DIAGNOSIS — K5730 Diverticulosis of large intestine without perforation or abscess without bleeding: Secondary | ICD-10-CM | POA: Diagnosis not present

## 2018-01-10 DIAGNOSIS — M5136 Other intervertebral disc degeneration, lumbar region: Secondary | ICD-10-CM | POA: Diagnosis not present

## 2018-01-10 DIAGNOSIS — M4186 Other forms of scoliosis, lumbar region: Secondary | ICD-10-CM | POA: Diagnosis not present

## 2018-01-10 DIAGNOSIS — I10 Essential (primary) hypertension: Secondary | ICD-10-CM | POA: Diagnosis not present

## 2018-01-10 DIAGNOSIS — H9190 Unspecified hearing loss, unspecified ear: Secondary | ICD-10-CM | POA: Diagnosis not present

## 2018-01-10 DIAGNOSIS — E785 Hyperlipidemia, unspecified: Secondary | ICD-10-CM | POA: Diagnosis not present

## 2018-01-10 DIAGNOSIS — M1711 Unilateral primary osteoarthritis, right knee: Secondary | ICD-10-CM | POA: Diagnosis not present

## 2018-01-10 DIAGNOSIS — Z9181 History of falling: Secondary | ICD-10-CM | POA: Diagnosis not present

## 2018-01-10 DIAGNOSIS — Z792 Long term (current) use of antibiotics: Secondary | ICD-10-CM | POA: Diagnosis not present

## 2018-01-10 DIAGNOSIS — Z79891 Long term (current) use of opiate analgesic: Secondary | ICD-10-CM | POA: Diagnosis not present

## 2018-01-11 ENCOUNTER — Encounter: Payer: Self-pay | Admitting: Family Medicine

## 2018-01-11 ENCOUNTER — Other Ambulatory Visit: Payer: Self-pay | Admitting: Family Medicine

## 2018-01-11 ENCOUNTER — Ambulatory Visit (INDEPENDENT_AMBULATORY_CARE_PROVIDER_SITE_OTHER): Payer: Medicare Other | Admitting: Family Medicine

## 2018-01-11 VITALS — BP 118/62 | HR 76 | Temp 98.9°F | Ht 63.0 in | Wt 162.2 lb

## 2018-01-11 DIAGNOSIS — I708 Atherosclerosis of other arteries: Secondary | ICD-10-CM

## 2018-01-11 DIAGNOSIS — K529 Noninfective gastroenteritis and colitis, unspecified: Secondary | ICD-10-CM | POA: Diagnosis not present

## 2018-01-11 DIAGNOSIS — M153 Secondary multiple arthritis: Secondary | ICD-10-CM

## 2018-01-11 DIAGNOSIS — M5136 Other intervertebral disc degeneration, lumbar region: Secondary | ICD-10-CM

## 2018-01-11 DIAGNOSIS — Z09 Encounter for follow-up examination after completed treatment for conditions other than malignant neoplasm: Secondary | ICD-10-CM | POA: Diagnosis not present

## 2018-01-11 DIAGNOSIS — Z5181 Encounter for therapeutic drug level monitoring: Secondary | ICD-10-CM

## 2018-01-11 DIAGNOSIS — N179 Acute kidney failure, unspecified: Secondary | ICD-10-CM

## 2018-01-11 DIAGNOSIS — D509 Iron deficiency anemia, unspecified: Secondary | ICD-10-CM

## 2018-01-11 DIAGNOSIS — M4126 Other idiopathic scoliosis, lumbar region: Secondary | ICD-10-CM

## 2018-01-11 DIAGNOSIS — I7090 Unspecified atherosclerosis: Secondary | ICD-10-CM

## 2018-01-11 DIAGNOSIS — R531 Weakness: Secondary | ICD-10-CM

## 2018-01-11 DIAGNOSIS — M25562 Pain in left knee: Secondary | ICD-10-CM

## 2018-01-11 DIAGNOSIS — G8929 Other chronic pain: Secondary | ICD-10-CM

## 2018-01-11 DIAGNOSIS — M79641 Pain in right hand: Secondary | ICD-10-CM

## 2018-01-11 DIAGNOSIS — R197 Diarrhea, unspecified: Secondary | ICD-10-CM

## 2018-01-11 DIAGNOSIS — M25561 Pain in right knee: Secondary | ICD-10-CM

## 2018-01-11 DIAGNOSIS — M25511 Pain in right shoulder: Secondary | ICD-10-CM

## 2018-01-11 DIAGNOSIS — M170 Bilateral primary osteoarthritis of knee: Secondary | ICD-10-CM

## 2018-01-11 MED ORDER — DICLOFENAC SODIUM 1 % TD GEL: 4.0000 g | Freq: Four times a day (QID) | TRANSDERMAL | 5 refills | Status: DC | PRN

## 2018-01-11 NOTE — Patient Instructions (Addendum)
Ask the physical therapist if I can prescribe any particular walker or cane for you Ask about the Nitro or anything similar if that would be appropriate We'll contact you about the labs  First number will be: [phone] Second number will be: [phone]

## 2018-01-11 NOTE — Progress Notes (Signed)
BP 118/62   Pulse 76   Temp 98.9 F (37.2 C) (Oral)   Ht 5' 3" (1.6 m)   Wt 162 lb 3.2 oz (73.6 kg)   SpO2 97%   BMI 28.73 kg/m    Subjective:    Patient ID: [name], female    DOB: 20-Feb-1931, 84 y.o.   MRN: [id number]  HPI: ROWYNN MCWEENEY is a 84 y.o. female  Chief Complaint  Patient presents with  . Hospitalization Follow-up    Colitis  . Hyperlipidemia    hospital started her on antorvastatin please verify which she needs to be on?    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; vascular surgery evalated and no surg intervention needed; they recommended statin and CMP done at baseline  Prerenal azotemia, Cr 1.12 up from 0.8 reviewed [facility] labs: H/H iimproved; WBC improved RBC low and retic count only 1.3 Iron studies low; folic acid and Q67 normal  Instructions included seeing PCP and ordering CBC, lactate, and BMP to monitor ischemic bowel, anemia, and kidney function On oral iron; monitor for further IV iron infusion Atorvastatin 40 mg was started due to extensive atherosclerotic disease  TOC phone call done on 01/04/18  She had some diarrhea on Tuesday; went five times that day; three times this morning; no mucous or blood; no abdominal pain  I had tried her on lipitor 40 mg but she did not tolerate it; she was put on pravastatin 20 mg daily; they released her to start back on her home medicine  She brought a bottle labeled hydrocodone/apa 5/325 from 11/03/2016; it unfortunately had  the main name of the drug (hydrocodone/apap) scratched out very distinctly and only that area was scratched off; however, a small label on the side of the bottle still verified that it contained hydrocodone/apap; patient says that the bottle is full of clonidine; she says she threw the bottle of clonidine out and needed somewhere to put the clonidine pills; I explained that didn't make any sense, as she would have had to dump all of those pills out into something and then purposely put them in another bottle; I advised her to NEVER ever put pills of one type into another bottle; explained if ever an issue for poison control or someone got hold of some pills in a mislabeled bottle, that can be very very dangerous  Physical therapy is coming out; weak still; he is helping her try to strengthen her back; standing up against the wall; will try to get him back on the cane; using the wheelchair and walker right now She brought in another Rx with 10 pills, pills match and bottle matches She is using the gel on her knees   Depression screen [facility] 2/9 01/11/2018 12/08/2017 06/13/2017 04/07/2017 03/23/2017  Decreased Interest 0 0 0 0 0  Down, Depressed, Hopeless 0 0 1 0 0  PHQ - 2 Score 0 0 1 0 0  Altered sleeping 0 0 - - -  Tired, decreased energy 0 0 - - -  Change  in appetite 0 0 - - -  Feeling bad or failure about yourself  0 0 - - -  Trouble concentrating 0 0 - - -  Moving slowly or fidgety/restless 0 0 - - -  Suicidal thoughts 0 0 - - -  PHQ-9 Score 0 0 - - -  Difficult doing work/chores Not difficult at all Not difficult at all - - -   Fall Risk  12/08/2017 06/13/2017 04/07/2017 03/23/2017 02/03/2017  Falls in the past year? 0 No No No No  Number falls in past yr: 0 - - - -  Injury with Fall? 0 - - - -    Relevant past medical, surgical, family and social history reviewed Past Medical History:  Diagnosis Date  . Arthritis of both knees 08/01/2015  . Colitis   . Essential hypertension, benign 07/28/2014    . GERD (gastroesophageal reflux disease)   . Hx of iron deficiency anemia 09/02/2015  . Hyperlipidemia   . Hypertension   . Lumbar pain   . OSA on CPAP 08/01/2015  . Overweight (BMI 25.0-29.9) 07/10/2013  . Prediabetes 09/02/2015   Past Surgical History:  Procedure Laterality Date  . ABDOMINAL HYSTERECTOMY    . CATARACT EXTRACTION    . CHOLECYSTECTOMY    . LUMBAR DISC SURGERY     Family History  Problem Relation Age of Onset  . Hypertension Daughter   . Diabetes Daughter   . Cancer Daughter        breast  . Diabetes Mother   . Heart disease Mother   . Cancer Father        lung; smoker  . Anuerysm Son    Social History   Tobacco Use  . Smoking status: Never Smoker  . Smokeless tobacco: Never Used  Substance Use Topics  . Alcohol use: No    Alcohol/week: 0.0 standard drinks  . Drug use: No     Office Visit from 01/11/2018 in [facility]  AUDIT-C Score  0      Interim medical history since last visit reviewed. Allergies and medications reviewed  Review of Systems Per HPI unless specifically indicated above     Objective:    BP 118/62   Pulse 76   Temp 98.9 F (37.2 C) (Oral)   Ht 5' 3" (1.6 m)   Wt 162 lb 3.2 oz (73.6 kg)   SpO2 97%   BMI 28.73 kg/m   Wt Readings from Last 3 Encounters:  01/11/18 162 lb 3.2 oz (73.6 kg)  12/08/17 166 lb 4.8 oz (75.4 kg)  06/27/17 167 lb (75.8 kg)    Physical Exam Constitutional:      General: She is not in acute distress.    Appearance: She is well-developed. She is not diaphoretic.     Comments: overweight  HENT:     Head: Normocephalic and atraumatic.  Eyes:     General: No scleral icterus. Neck:     Thyroid: No thyromegaly.  Cardiovascular:     Rate and Rhythm: Normal rate and regular rhythm.     Heart sounds: Normal heart sounds. No murmur.  Pulmonary:     Effort: Pulmonary effort is normal. No respiratory distress.     Breath sounds: Normal breath sounds. No wheezing.  Abdominal:      General: Bowel sounds are normal. There is no distension.     Palpations: Abdomen is soft.     Tenderness: There is no abdominal tenderness.  Skin:    General:   Skin is warm and dry.     Coloration: Skin is not pale.  Neurological:     Mental Status: She is alert.     Comments: Seated in wheelchair; gait not assessed  Psychiatric:        Behavior: Behavior normal.        Thought Content: Thought content normal.        Judgment: Judgment normal.     Results for orders placed or performed in visit on 12/08/17  COMPLETE METABOLIC PANEL WITH GFR  Result Value Ref Range   Glucose, Bld 85 65 - 99 mg/dL   BUN 26 (H) 7 - 25 mg/dL   Creat 0.78 0.60 - 0.88 mg/dL   GFR, Est Non African American 70 > OR = 60 mL/min/1.73m2   GFR, Est African American 81 > OR = 60 mL/min/1.73m2   BUN/Creatinine Ratio 33 (H) 6 - 22 (calc)   Sodium 138 135 - 146 mmol/L   Potassium 3.4 (L) 3.5 - 5.3 mmol/L   Chloride 103 98 - 110 mmol/L   CO2 27 20 - 32 mmol/L   Calcium 9.4 8.6 - 10.4 mg/dL   Total Protein 7.2 6.1 - 8.1 g/dL   Albumin 4.0 3.6 - 5.1 g/dL   Globulin 3.2 1.9 - 3.7 g/dL (calc)   AG Ratio 1.3 1.0 - 2.5 (calc)   Total Bilirubin 0.4 0.2 - 1.2 mg/dL   Alkaline phosphatase (APISO) 47 33 - 130 U/L   AST 16 10 - 35 U/L   ALT 7 6 - 29 U/L  Hemoglobin A1c  Result Value Ref Range   Hgb A1c MFr Bld 6.1 (H) <5.7 % of total Hgb   Mean Plasma Glucose 128 (calc)   eAG (mmol/L) 7.1 (calc)  Lipid panel  Result Value Ref Range   Cholesterol 208 (H) <200 mg/dL   HDL 54 >50 mg/dL   Triglycerides 116 <150 mg/dL   LDL Cholesterol (Calc) 132 (H) mg/dL (calc)   Total CHOL/HDL Ratio 3.9 <5.0 (calc)   Non-HDL Cholesterol (Calc) 154 (H) <130 mg/dL (calc)  Urine Drug Screen w/Alc, no confirm  Result Value Ref Range   Please note     AMPHETAMINES (1000 ng/mL SCRN) NEGATIVE    BARBITURATES NEGATIVE    BENZODIAZEPINES NEGATIVE    COCAINE METABOLITES NEGATIVE    MARIJUANA MET (50 ng/mL SCRN) NEGATIVE     METHADONE NEGATIVE    METHAQUALONE NEGATIVE    OPIATES POSITIVE (A)    PHENCYCLIDINE NEGATIVE    PROPOXYPHENE NEGATIVE    ALCOHOL, ETHYL (U) NEGATIVE       Assessment & Plan:   Problem List Items Addressed This Visit      Musculoskeletal and Integument   Lumbar scoliosis   Relevant Orders   Ambulatory referral to Pain Clinic   DDD (degenerative disc disease), lumbar   Relevant Orders   Ambulatory referral to Pain Clinic   Arthritis of both knees   Relevant Orders   Ambulatory referral to Pain Clinic     Other   Medication monitoring encounter   Relevant Orders   CBC with Differential/Platelet   BASIC METABOLIC PANEL WITH GFR    Other Visit Diagnoses    Hospital discharge follow-up    -  Primary   TOC note in [facility], Care Everywhere; meds reconciled; hospital records reviewed   Colitis       Relevant Orders   Lactic acid, plasma   Iron deficiency anemia, unspecified iron deficiency anemia type         Relevant Medications   ferrous sulfate 325 (65 FE) MG tablet   Other Relevant Orders   CBC with Differential/Platelet   Acute kidney injury (HCC)       Relevant Orders   BASIC METABOLIC PANEL WITH GFR   Secondary osteoarthritis of multiple sites       Relevant Medications   diclofenac sodium (VOLTAREN) 1 % GEL   Diarrhea, unspecified type       Relevant Orders   C. difficile GDH and Toxin A/B   Atherosclerosis of arteries       Chronic pain of both knees       Relevant Orders   Ambulatory referral to Pain Clinic   Chronic right shoulder pain       Relevant Orders   Ambulatory referral to Pain Clinic   Hand pain, right       Relevant Orders   Ambulatory referral to Pain Clinic   Weakness       related to recent hospital stay; home health, therapy       Follow up plan: No follow-ups on file.  An after-visit summary was printed and given to the patient at check-out.  Please see the patient instructions which may contain other information and recommendations  beyond what is mentioned above in the assessment and plan.  Meds ordered this encounter  Medications  . diclofenac sodium (VOLTAREN) 1 % GEL    Sig: Apply 4 g topically 4 (four) times daily as needed. On the knees    Dispense:  100 g    Refill:  5    Orders Placed This Encounter  Procedures  . C. difficile GDH and Toxin A/B  . Lactic acid, plasma  . CBC with Differential/Platelet  . BASIC METABOLIC PANEL WITH GFR  . Ambulatory referral to Pain Clinic       

## 2018-01-12 DIAGNOSIS — D509 Iron deficiency anemia, unspecified: Secondary | ICD-10-CM | POA: Diagnosis not present

## 2018-01-12 DIAGNOSIS — E785 Hyperlipidemia, unspecified: Secondary | ICD-10-CM | POA: Diagnosis not present

## 2018-01-12 DIAGNOSIS — Z9181 History of falling: Secondary | ICD-10-CM | POA: Diagnosis not present

## 2018-01-12 DIAGNOSIS — K559 Vascular disorder of intestine, unspecified: Secondary | ICD-10-CM | POA: Diagnosis not present

## 2018-01-12 DIAGNOSIS — Z7982 Long term (current) use of aspirin: Secondary | ICD-10-CM | POA: Diagnosis not present

## 2018-01-12 DIAGNOSIS — Z79891 Long term (current) use of opiate analgesic: Secondary | ICD-10-CM | POA: Diagnosis not present

## 2018-01-12 DIAGNOSIS — M1712 Unilateral primary osteoarthritis, left knee: Secondary | ICD-10-CM | POA: Diagnosis not present

## 2018-01-12 DIAGNOSIS — H9190 Unspecified hearing loss, unspecified ear: Secondary | ICD-10-CM | POA: Diagnosis not present

## 2018-01-12 DIAGNOSIS — G4733 Obstructive sleep apnea (adult) (pediatric): Secondary | ICD-10-CM | POA: Diagnosis not present

## 2018-01-12 DIAGNOSIS — M5136 Other intervertebral disc degeneration, lumbar region: Secondary | ICD-10-CM | POA: Diagnosis not present

## 2018-01-12 DIAGNOSIS — Z792 Long term (current) use of antibiotics: Secondary | ICD-10-CM | POA: Diagnosis not present

## 2018-01-12 DIAGNOSIS — I10 Essential (primary) hypertension: Secondary | ICD-10-CM | POA: Diagnosis not present

## 2018-01-12 DIAGNOSIS — K5730 Diverticulosis of large intestine without perforation or abscess without bleeding: Secondary | ICD-10-CM | POA: Diagnosis not present

## 2018-01-12 DIAGNOSIS — M4186 Other forms of scoliosis, lumbar region: Secondary | ICD-10-CM | POA: Diagnosis not present

## 2018-01-12 DIAGNOSIS — M1711 Unilateral primary osteoarthritis, right knee: Secondary | ICD-10-CM | POA: Diagnosis not present

## 2018-01-12 LAB — CBC WITH DIFFERENTIAL/PLATELET
BASOS PCT: 0.6 %
Basophils Absolute: 40 cells/uL (ref 0–200)
Eosinophils Absolute: 181 cells/uL (ref 15–500)
Eosinophils Relative: 2.7 %
HCT: 31.1 % — ABNORMAL LOW (ref 35.0–45.0)
Hemoglobin: 10.1 g/dL — ABNORMAL LOW (ref 11.7–15.5)
Lymphs Abs: 2593 cells/uL (ref 850–3900)
MCH: 26.9 pg — ABNORMAL LOW (ref 27.0–33.0)
MCHC: 32.5 g/dL (ref 32.0–36.0)
MCV: 82.9 fL (ref 80.0–100.0)
MPV: 11.3 fL (ref 7.5–12.5)
Monocytes Relative: 6.8 %
Neutro Abs: 3430 cells/uL (ref 1500–7800)
Neutrophils Relative %: 51.2 %
Platelets: 315 10*3/uL (ref 140–400)
RBC: 3.75 10*6/uL — ABNORMAL LOW (ref 3.80–5.10)
RDW: 12.6 % (ref 11.0–15.0)
Total Lymphocyte: 38.7 %
WBC mixed population: 456 cells/uL (ref 200–950)
WBC: 6.7 10*3/uL (ref 3.8–10.8)

## 2018-01-12 LAB — BASIC METABOLIC PANEL WITH GFR
BUN: 18 mg/dL (ref 7–25)
CO2: 27 mmol/L (ref 20–32)
Calcium: 9.1 mg/dL (ref 8.6–10.4)
Chloride: 104 mmol/L (ref 98–110)
Creat: 0.76 mg/dL (ref 0.60–0.88)
GFR, Est African American: 83 mL/min/{1.73_m2} (ref >=60)
GFR, Est Non African American: 72 mL/min/{1.73_m2} (ref >=60)
Glucose, Bld: 96 mg/dL (ref 65–99)
Potassium: 3.2 mmol/L — ABNORMAL LOW (ref 3.5–5.3)
Sodium: 141 mmol/L (ref 135–146)

## 2018-01-12 LAB — LACTIC ACID, PLASMA: LACTIC ACID: 0.7 mmol/L (ref 0.4–1.8)

## 2018-01-15 ENCOUNTER — Other Ambulatory Visit: Payer: Self-pay | Admitting: Family Medicine

## 2018-01-15 MED ORDER — POTASSIUM CHLORIDE ER 10 MEQ PO TBCR: 10.0000 meq | EXTENDED_RELEASE_TABLET | Freq: Two times a day (BID) | ORAL | 0 refills | Status: DC

## 2018-01-16 ENCOUNTER — Telehealth: Payer: Self-pay | Admitting: Family Medicine

## 2018-01-16 DIAGNOSIS — E785 Hyperlipidemia, unspecified: Secondary | ICD-10-CM | POA: Diagnosis not present

## 2018-01-16 DIAGNOSIS — K559 Vascular disorder of intestine, unspecified: Secondary | ICD-10-CM | POA: Diagnosis not present

## 2018-01-16 DIAGNOSIS — K5730 Diverticulosis of large intestine without perforation or abscess without bleeding: Secondary | ICD-10-CM | POA: Diagnosis not present

## 2018-01-16 DIAGNOSIS — I10 Essential (primary) hypertension: Secondary | ICD-10-CM | POA: Diagnosis not present

## 2018-01-16 DIAGNOSIS — D509 Iron deficiency anemia, unspecified: Secondary | ICD-10-CM | POA: Diagnosis not present

## 2018-01-16 DIAGNOSIS — Z79891 Long term (current) use of opiate analgesic: Secondary | ICD-10-CM | POA: Diagnosis not present

## 2018-01-16 DIAGNOSIS — Z7982 Long term (current) use of aspirin: Secondary | ICD-10-CM | POA: Diagnosis not present

## 2018-01-16 DIAGNOSIS — M1711 Unilateral primary osteoarthritis, right knee: Secondary | ICD-10-CM | POA: Diagnosis not present

## 2018-01-16 DIAGNOSIS — H9190 Unspecified hearing loss, unspecified ear: Secondary | ICD-10-CM | POA: Diagnosis not present

## 2018-01-16 DIAGNOSIS — M5136 Other intervertebral disc degeneration, lumbar region: Secondary | ICD-10-CM | POA: Diagnosis not present

## 2018-01-16 DIAGNOSIS — M4186 Other forms of scoliosis, lumbar region: Secondary | ICD-10-CM | POA: Diagnosis not present

## 2018-01-16 DIAGNOSIS — M1712 Unilateral primary osteoarthritis, left knee: Secondary | ICD-10-CM | POA: Diagnosis not present

## 2018-01-16 DIAGNOSIS — Z792 Long term (current) use of antibiotics: Secondary | ICD-10-CM | POA: Diagnosis not present

## 2018-01-16 DIAGNOSIS — G4733 Obstructive sleep apnea (adult) (pediatric): Secondary | ICD-10-CM | POA: Diagnosis not present

## 2018-01-16 DIAGNOSIS — Z9181 History of falling: Secondary | ICD-10-CM | POA: Diagnosis not present

## 2018-01-16 NOTE — Telephone Encounter (Signed)
Jocelyn [name], I don't have any results yet for the C diff Please find out if she is still having diarrhea If she is, we need those results right away C diff can be dangerous and needs to be treated If NOT having diarrhea and her stools are formed and back to normal, then cancel C diff

## 2018-01-16 NOTE — Telephone Encounter (Signed)
Pt notified, is not having diarrhea so will hold off on specimen

## 2018-01-19 DIAGNOSIS — Z7982 Long term (current) use of aspirin: Secondary | ICD-10-CM | POA: Diagnosis not present

## 2018-01-19 DIAGNOSIS — M1712 Unilateral primary osteoarthritis, left knee: Secondary | ICD-10-CM | POA: Diagnosis not present

## 2018-01-19 DIAGNOSIS — H9190 Unspecified hearing loss, unspecified ear: Secondary | ICD-10-CM | POA: Diagnosis not present

## 2018-01-19 DIAGNOSIS — Z79891 Long term (current) use of opiate analgesic: Secondary | ICD-10-CM | POA: Diagnosis not present

## 2018-01-19 DIAGNOSIS — G4733 Obstructive sleep apnea (adult) (pediatric): Secondary | ICD-10-CM | POA: Diagnosis not present

## 2018-01-19 DIAGNOSIS — K5730 Diverticulosis of large intestine without perforation or abscess without bleeding: Secondary | ICD-10-CM | POA: Diagnosis not present

## 2018-01-19 DIAGNOSIS — M5136 Other intervertebral disc degeneration, lumbar region: Secondary | ICD-10-CM | POA: Diagnosis not present

## 2018-01-19 DIAGNOSIS — K559 Vascular disorder of intestine, unspecified: Secondary | ICD-10-CM | POA: Diagnosis not present

## 2018-01-19 DIAGNOSIS — M4186 Other forms of scoliosis, lumbar region: Secondary | ICD-10-CM | POA: Diagnosis not present

## 2018-01-19 DIAGNOSIS — I10 Essential (primary) hypertension: Secondary | ICD-10-CM | POA: Diagnosis not present

## 2018-01-19 DIAGNOSIS — Z792 Long term (current) use of antibiotics: Secondary | ICD-10-CM | POA: Diagnosis not present

## 2018-01-19 DIAGNOSIS — M1711 Unilateral primary osteoarthritis, right knee: Secondary | ICD-10-CM | POA: Diagnosis not present

## 2018-01-19 DIAGNOSIS — Z9181 History of falling: Secondary | ICD-10-CM | POA: Diagnosis not present

## 2018-01-19 DIAGNOSIS — D509 Iron deficiency anemia, unspecified: Secondary | ICD-10-CM | POA: Diagnosis not present

## 2018-01-19 DIAGNOSIS — E785 Hyperlipidemia, unspecified: Secondary | ICD-10-CM | POA: Diagnosis not present

## 2018-01-23 DIAGNOSIS — G4733 Obstructive sleep apnea (adult) (pediatric): Secondary | ICD-10-CM | POA: Diagnosis not present

## 2018-01-23 DIAGNOSIS — H9190 Unspecified hearing loss, unspecified ear: Secondary | ICD-10-CM | POA: Diagnosis not present

## 2018-01-23 DIAGNOSIS — Z9181 History of falling: Secondary | ICD-10-CM | POA: Diagnosis not present

## 2018-01-23 DIAGNOSIS — M1711 Unilateral primary osteoarthritis, right knee: Secondary | ICD-10-CM | POA: Diagnosis not present

## 2018-01-23 DIAGNOSIS — Z7982 Long term (current) use of aspirin: Secondary | ICD-10-CM | POA: Diagnosis not present

## 2018-01-23 DIAGNOSIS — M4186 Other forms of scoliosis, lumbar region: Secondary | ICD-10-CM | POA: Diagnosis not present

## 2018-01-23 DIAGNOSIS — M1712 Unilateral primary osteoarthritis, left knee: Secondary | ICD-10-CM | POA: Diagnosis not present

## 2018-01-23 DIAGNOSIS — I10 Essential (primary) hypertension: Secondary | ICD-10-CM | POA: Diagnosis not present

## 2018-01-23 DIAGNOSIS — D509 Iron deficiency anemia, unspecified: Secondary | ICD-10-CM | POA: Diagnosis not present

## 2018-01-23 DIAGNOSIS — Z79891 Long term (current) use of opiate analgesic: Secondary | ICD-10-CM | POA: Diagnosis not present

## 2018-01-23 DIAGNOSIS — Z792 Long term (current) use of antibiotics: Secondary | ICD-10-CM | POA: Diagnosis not present

## 2018-01-23 DIAGNOSIS — K559 Vascular disorder of intestine, unspecified: Secondary | ICD-10-CM | POA: Diagnosis not present

## 2018-01-23 DIAGNOSIS — E785 Hyperlipidemia, unspecified: Secondary | ICD-10-CM | POA: Diagnosis not present

## 2018-01-23 DIAGNOSIS — K5730 Diverticulosis of large intestine without perforation or abscess without bleeding: Secondary | ICD-10-CM | POA: Diagnosis not present

## 2018-01-23 DIAGNOSIS — M5136 Other intervertebral disc degeneration, lumbar region: Secondary | ICD-10-CM | POA: Diagnosis not present

## 2018-01-26 DIAGNOSIS — Z79891 Long term (current) use of opiate analgesic: Secondary | ICD-10-CM | POA: Diagnosis not present

## 2018-01-26 DIAGNOSIS — Z792 Long term (current) use of antibiotics: Secondary | ICD-10-CM | POA: Diagnosis not present

## 2018-01-26 DIAGNOSIS — H9190 Unspecified hearing loss, unspecified ear: Secondary | ICD-10-CM | POA: Diagnosis not present

## 2018-01-26 DIAGNOSIS — Z7982 Long term (current) use of aspirin: Secondary | ICD-10-CM | POA: Diagnosis not present

## 2018-01-26 DIAGNOSIS — I10 Essential (primary) hypertension: Secondary | ICD-10-CM | POA: Diagnosis not present

## 2018-01-26 DIAGNOSIS — M5136 Other intervertebral disc degeneration, lumbar region: Secondary | ICD-10-CM | POA: Diagnosis not present

## 2018-01-26 DIAGNOSIS — M4186 Other forms of scoliosis, lumbar region: Secondary | ICD-10-CM | POA: Diagnosis not present

## 2018-01-26 DIAGNOSIS — K5730 Diverticulosis of large intestine without perforation or abscess without bleeding: Secondary | ICD-10-CM | POA: Diagnosis not present

## 2018-01-26 DIAGNOSIS — D509 Iron deficiency anemia, unspecified: Secondary | ICD-10-CM | POA: Diagnosis not present

## 2018-01-26 DIAGNOSIS — M1711 Unilateral primary osteoarthritis, right knee: Secondary | ICD-10-CM | POA: Diagnosis not present

## 2018-01-26 DIAGNOSIS — M1712 Unilateral primary osteoarthritis, left knee: Secondary | ICD-10-CM | POA: Diagnosis not present

## 2018-01-26 DIAGNOSIS — K559 Vascular disorder of intestine, unspecified: Secondary | ICD-10-CM | POA: Diagnosis not present

## 2018-01-26 DIAGNOSIS — E785 Hyperlipidemia, unspecified: Secondary | ICD-10-CM | POA: Diagnosis not present

## 2018-01-26 DIAGNOSIS — G4733 Obstructive sleep apnea (adult) (pediatric): Secondary | ICD-10-CM | POA: Diagnosis not present

## 2018-01-26 DIAGNOSIS — Z9181 History of falling: Secondary | ICD-10-CM | POA: Diagnosis not present

## 2018-01-29 DIAGNOSIS — M1711 Unilateral primary osteoarthritis, right knee: Secondary | ICD-10-CM | POA: Diagnosis not present

## 2018-01-29 DIAGNOSIS — G4733 Obstructive sleep apnea (adult) (pediatric): Secondary | ICD-10-CM | POA: Diagnosis not present

## 2018-01-29 DIAGNOSIS — M5136 Other intervertebral disc degeneration, lumbar region: Secondary | ICD-10-CM | POA: Diagnosis not present

## 2018-01-29 DIAGNOSIS — I10 Essential (primary) hypertension: Secondary | ICD-10-CM | POA: Diagnosis not present

## 2018-01-29 DIAGNOSIS — H9190 Unspecified hearing loss, unspecified ear: Secondary | ICD-10-CM | POA: Diagnosis not present

## 2018-01-29 DIAGNOSIS — D509 Iron deficiency anemia, unspecified: Secondary | ICD-10-CM | POA: Diagnosis not present

## 2018-01-29 DIAGNOSIS — M4186 Other forms of scoliosis, lumbar region: Secondary | ICD-10-CM | POA: Diagnosis not present

## 2018-01-29 DIAGNOSIS — M1712 Unilateral primary osteoarthritis, left knee: Secondary | ICD-10-CM | POA: Diagnosis not present

## 2018-01-29 DIAGNOSIS — K5730 Diverticulosis of large intestine without perforation or abscess without bleeding: Secondary | ICD-10-CM | POA: Diagnosis not present

## 2018-01-29 DIAGNOSIS — K559 Vascular disorder of intestine, unspecified: Secondary | ICD-10-CM | POA: Diagnosis not present

## 2018-01-29 DIAGNOSIS — Z7982 Long term (current) use of aspirin: Secondary | ICD-10-CM | POA: Diagnosis not present

## 2018-01-29 DIAGNOSIS — Z79891 Long term (current) use of opiate analgesic: Secondary | ICD-10-CM | POA: Diagnosis not present

## 2018-01-29 DIAGNOSIS — Z792 Long term (current) use of antibiotics: Secondary | ICD-10-CM | POA: Diagnosis not present

## 2018-01-29 DIAGNOSIS — Z9181 History of falling: Secondary | ICD-10-CM | POA: Diagnosis not present

## 2018-01-29 DIAGNOSIS — E785 Hyperlipidemia, unspecified: Secondary | ICD-10-CM | POA: Diagnosis not present

## 2018-02-01 DIAGNOSIS — M1712 Unilateral primary osteoarthritis, left knee: Secondary | ICD-10-CM | POA: Diagnosis not present

## 2018-02-01 DIAGNOSIS — K5730 Diverticulosis of large intestine without perforation or abscess without bleeding: Secondary | ICD-10-CM | POA: Diagnosis not present

## 2018-02-01 DIAGNOSIS — I10 Essential (primary) hypertension: Secondary | ICD-10-CM | POA: Diagnosis not present

## 2018-02-01 DIAGNOSIS — G4733 Obstructive sleep apnea (adult) (pediatric): Secondary | ICD-10-CM | POA: Diagnosis not present

## 2018-02-01 DIAGNOSIS — M4186 Other forms of scoliosis, lumbar region: Secondary | ICD-10-CM | POA: Diagnosis not present

## 2018-02-01 DIAGNOSIS — Z7982 Long term (current) use of aspirin: Secondary | ICD-10-CM | POA: Diagnosis not present

## 2018-02-01 DIAGNOSIS — H9190 Unspecified hearing loss, unspecified ear: Secondary | ICD-10-CM | POA: Diagnosis not present

## 2018-02-01 DIAGNOSIS — Z79891 Long term (current) use of opiate analgesic: Secondary | ICD-10-CM | POA: Diagnosis not present

## 2018-02-01 DIAGNOSIS — E785 Hyperlipidemia, unspecified: Secondary | ICD-10-CM | POA: Diagnosis not present

## 2018-02-01 DIAGNOSIS — K559 Vascular disorder of intestine, unspecified: Secondary | ICD-10-CM | POA: Diagnosis not present

## 2018-02-01 DIAGNOSIS — M1711 Unilateral primary osteoarthritis, right knee: Secondary | ICD-10-CM | POA: Diagnosis not present

## 2018-02-01 DIAGNOSIS — Z792 Long term (current) use of antibiotics: Secondary | ICD-10-CM | POA: Diagnosis not present

## 2018-02-01 DIAGNOSIS — Z9181 History of falling: Secondary | ICD-10-CM | POA: Diagnosis not present

## 2018-02-01 DIAGNOSIS — M5136 Other intervertebral disc degeneration, lumbar region: Secondary | ICD-10-CM | POA: Diagnosis not present

## 2018-02-01 DIAGNOSIS — D509 Iron deficiency anemia, unspecified: Secondary | ICD-10-CM | POA: Diagnosis not present

## 2018-02-02 ENCOUNTER — Telehealth: Payer: Self-pay | Admitting: Family Medicine

## 2018-02-02 ENCOUNTER — Other Ambulatory Visit: Payer: Self-pay | Admitting: *Deleted

## 2018-02-02 NOTE — Telephone Encounter (Signed)
Pt scheduled an appt, but daughter also states has not heard anything from pain clinic and needs refill on pain med   Melissa please check on referral to pain clinic

## 2018-02-02 NOTE — Telephone Encounter (Signed)
No, not normal If she sees GI, route to them If not seeing GI, appt here

## 2018-02-02 NOTE — Telephone Encounter (Signed)
Copied from [place] [phone]. Topic: Quick Communication - Rx Refill/Question >> Feb 02, 2018  4:36 PM Wynetta Emery, [place] C wrote: Medication: HYDROcodone-acetaminophen (NORCO/VICODIN) 5-325 MG tablet  Has the patient contacted their pharmacy? No  (Agent: If no, request that the patient contact the pharmacy for the refill.) (Agent: If yes, when and what did the pharmacy advise?)  Preferred Pharmacy (with phone number or street name): [id number] [address], [place] [phone] (Phone) [phone] (Fax)    Agent: Please be advised that RX refills may take up to 3 business days. We ask that you follow-up with your pharmacy.

## 2018-02-02 NOTE — Addendum Note (Signed)
Addended by: Docia Furl on: 02/02/2018 05:17 PM   Modules accepted: Orders

## 2018-02-02 NOTE — Telephone Encounter (Signed)
Patient states that ever since she was in the hospital in December her bowels have been a dark green. She would like to know is that normal. Please Advise   Called patient back regarding the message with her stool being a dark green. She was started on and iron tablet on 01/11/18. She has not seen any red or blood on her stool. Denies abd pain. States stool may be getting a little constipated.  Advised to drink plenty of water to keep her system flushed. She has been going to therapy and the therapist told her that her b/p is good and she needs to keep on doing what she is doing.  Advised to call back for noticeable blood in stool, dizziness, change in b/p or shortness of breath. Pt voiced understanding.

## 2018-02-05 DIAGNOSIS — D509 Iron deficiency anemia, unspecified: Secondary | ICD-10-CM | POA: Diagnosis not present

## 2018-02-05 DIAGNOSIS — Z79891 Long term (current) use of opiate analgesic: Secondary | ICD-10-CM | POA: Diagnosis not present

## 2018-02-05 DIAGNOSIS — M1711 Unilateral primary osteoarthritis, right knee: Secondary | ICD-10-CM | POA: Diagnosis not present

## 2018-02-05 DIAGNOSIS — Z792 Long term (current) use of antibiotics: Secondary | ICD-10-CM | POA: Diagnosis not present

## 2018-02-05 DIAGNOSIS — M4186 Other forms of scoliosis, lumbar region: Secondary | ICD-10-CM | POA: Diagnosis not present

## 2018-02-05 DIAGNOSIS — Z9181 History of falling: Secondary | ICD-10-CM | POA: Diagnosis not present

## 2018-02-05 DIAGNOSIS — I10 Essential (primary) hypertension: Secondary | ICD-10-CM | POA: Diagnosis not present

## 2018-02-05 DIAGNOSIS — M5136 Other intervertebral disc degeneration, lumbar region: Secondary | ICD-10-CM | POA: Diagnosis not present

## 2018-02-05 DIAGNOSIS — E785 Hyperlipidemia, unspecified: Secondary | ICD-10-CM | POA: Diagnosis not present

## 2018-02-05 DIAGNOSIS — H9190 Unspecified hearing loss, unspecified ear: Secondary | ICD-10-CM | POA: Diagnosis not present

## 2018-02-05 DIAGNOSIS — K5730 Diverticulosis of large intestine without perforation or abscess without bleeding: Secondary | ICD-10-CM | POA: Diagnosis not present

## 2018-02-05 DIAGNOSIS — M1712 Unilateral primary osteoarthritis, left knee: Secondary | ICD-10-CM | POA: Diagnosis not present

## 2018-02-05 DIAGNOSIS — K559 Vascular disorder of intestine, unspecified: Secondary | ICD-10-CM | POA: Diagnosis not present

## 2018-02-05 DIAGNOSIS — G4733 Obstructive sleep apnea (adult) (pediatric): Secondary | ICD-10-CM | POA: Diagnosis not present

## 2018-02-05 DIAGNOSIS — Z7982 Long term (current) use of aspirin: Secondary | ICD-10-CM | POA: Diagnosis not present

## 2018-02-05 MED ORDER — HYDROCODONE-ACETAMINOPHEN 5-325 MG PO TABS: 1.0000 | ORAL_TABLET | Freq: Three times a day (TID) | ORAL | 0 refills | Status: DC | PRN

## 2018-02-05 NOTE — Telephone Encounter (Signed)
PMP Aware reviewed No other prescribers Rx approved while awaiting appt with pain clinic

## 2018-02-05 NOTE — Addendum Note (Signed)
Addended by: Lekisha Mcghee, Satira Anis on: 02/05/2018 01:22 PM   Modules accepted: Orders

## 2018-02-05 NOTE — Telephone Encounter (Signed)
PMP Aware reviewed Awaiting pain clinic appt Rx approved

## 2018-02-07 ENCOUNTER — Encounter: Payer: Self-pay | Admitting: Family Medicine

## 2018-02-07 ENCOUNTER — Ambulatory Visit (INDEPENDENT_AMBULATORY_CARE_PROVIDER_SITE_OTHER): Payer: Medicare Other | Admitting: Family Medicine

## 2018-02-07 ENCOUNTER — Encounter: Payer: Self-pay | Admitting: Gastroenterology

## 2018-02-07 VITALS — BP 136/64 | HR 92 | Temp 98.2°F | Ht 63.0 in | Wt 152.9 lb

## 2018-02-07 DIAGNOSIS — D509 Iron deficiency anemia, unspecified: Secondary | ICD-10-CM | POA: Diagnosis not present

## 2018-02-07 DIAGNOSIS — K529 Noninfective gastroenteritis and colitis, unspecified: Secondary | ICD-10-CM

## 2018-02-07 DIAGNOSIS — R195 Other fecal abnormalities: Secondary | ICD-10-CM

## 2018-02-07 DIAGNOSIS — E876 Hypokalemia: Secondary | ICD-10-CM

## 2018-02-07 DIAGNOSIS — N289 Disorder of kidney and ureter, unspecified: Secondary | ICD-10-CM

## 2018-02-07 DIAGNOSIS — Z1239 Encounter for other screening for malignant neoplasm of breast: Secondary | ICD-10-CM

## 2018-02-07 DIAGNOSIS — R634 Abnormal weight loss: Secondary | ICD-10-CM

## 2018-02-07 DIAGNOSIS — K7689 Other specified diseases of liver: Secondary | ICD-10-CM

## 2018-02-07 LAB — HEMOCCULT GUIAC POC 1CARD (OFFICE)
Card #1 Date: 1082020
Fecal Occult Blood, POC: POSITIVE — AB

## 2018-02-07 NOTE — Progress Notes (Signed)
BP 136/64   Pulse 92   Temp 98.2 F (36.8 C) (Oral)   Ht 5\' 3"  (1.6 m)   Wt 152 lb 14.4 oz (69.4 kg)   SpO2 97%   BMI 27.09 kg/m    Subjective:    Patient ID: Jocelyn [name], female    DOB: [birth year], 85 y.o.   MRN: [id number]  HPI: Jocelyn [name] is a 85 y.o. female  Chief Complaint  Patient presents with  . Stool Color Change    HPI Patient is here concerned about green stools She has had green stools since she was in the hospital Her stools have been dark ever since the hospital stay; she had to drink the stuff Having BMs about every other day, but had BMs twice yesterday, once today BMs are usually 4 on the [facility], sometimes 5 Stool smells the same No blood in the stool No melena No travel No rare meat, no raw sushi No pets, no exposure to sick pets No pet turtles Drinking bottled water; no well No similar complaints from friends or family No fevers No abdominal pain On iron therapy But the stool is green She has lost weight; not trying; daughter says good appetite; not like her mother to not eat and she is eating well She has not changed her eating at all Good appetite One family member with thyroid disease; serious thyroid trouble No lumps in the neck No abdominal pain Younger sister has been losing weight too, going back and forth to the doctor, they are working her up and losing weight and not trying and the doctors are trying to figure it out Reviewed path report; reviewed the CT scan Hepatic cyst, renal lesion too small to characterize No blood in the urine Daughter has hepatic cyst No jaundice  Depression screen [facility] 2/9 02/07/2018 01/11/2018 12/08/2017 06/13/2017 04/07/2017  Decreased Interest 0 0 0 0 0  Down, Depressed, Hopeless 0 0 0 1 0  PHQ - 2 Score 0 0 0 1 0  Altered sleeping 0 0 0 - -  Tired, decreased energy 0 0 0 - -  Change in appetite 0 0 0 - -  Feeling bad or failure about yourself  0 0 0 - -  Trouble concentrating 0 0 0 - -    Moving slowly or fidgety/restless 0 0 0 - -  Suicidal thoughts 0 0 0 - -  PHQ-9 Score 0 0 0 - -  Difficult doing work/chores Not difficult at all Not difficult at all Not difficult at all - -   Fall Risk  02/07/2018 12/08/2017 06/13/2017 04/07/2017 03/23/2017  Falls in the past year? 0 0 No No No  Number falls in past yr: 0 0 - - -  Injury with Fall? 0 0 - - -    Relevant past medical, surgical, family and social history reviewed Past Medical History:  Diagnosis Date  . Arthritis of both knees 08/01/2015  . Colitis   . Essential hypertension, benign 07/28/2014  . GERD (gastroesophageal reflux disease)   . Hx of iron deficiency anemia 09/02/2015  . Hyperlipidemia   . Hypertension   . Lumbar pain   . OSA on CPAP 08/01/2015  . Overweight (BMI 25.0-29.9) 07/10/2013  . Prediabetes 09/02/2015   Past Surgical History:  Procedure Laterality Date  . ABDOMINAL HYSTERECTOMY    . CATARACT EXTRACTION    . CHOLECYSTECTOMY    . LUMBAR DISC SURGERY     Family History  Problem Relation Age  of Onset  . Hypertension Daughter   . Diabetes Daughter   . Cancer Daughter        breast  . Diabetes Mother   . Heart disease Mother   . Cancer Father        lung; smoker  . Anuerysm Son    Social History   Tobacco Use  . Smoking status: Never Smoker  . Smokeless tobacco: Never Used  Substance Use Topics  . Alcohol use: No    Alcohol/week: 0.0 standard drinks  . Drug use: No     Office Visit from 02/07/2018 in [facility]  AUDIT-C Score  0      Interim medical history since last visit reviewed. Allergies and medications reviewed  Review of Systems  Constitutional: Positive for unexpected weight change.  Gastrointestinal: Negative for abdominal pain.  Genitourinary: Negative for hematuria.  Skin:       No worrisome moles   Per HPI unless specifically indicated above     Objective:    BP 136/64   Pulse 92   Temp 98.2 F (36.8 C) (Oral)   Ht 5\' 3"  (1.6 m)   Wt 152 lb  14.4 oz (69.4 kg)   SpO2 97%   BMI 27.09 kg/m   Wt Readings from Last 3 Encounters:  02/07/18 152 lb 14.4 oz (69.4 kg)  01/11/18 162 lb 3.2 oz (73.6 kg)  12/08/17 166 lb 4.8 oz (75.4 kg)    Physical Exam Exam conducted with a chaperone present.  Constitutional:      General: She is not in acute distress.    Appearance: She is well-developed. She is not diaphoretic.  HENT:     Head: Normocephalic and atraumatic.     Mouth/Throat:     Mouth: Mucous membranes are moist.  Eyes:     General: No scleral icterus. Neck:     Thyroid: No thyromegaly.  Cardiovascular:     Rate and Rhythm: Normal rate and regular rhythm.     Heart sounds: Normal heart sounds. No murmur.  Pulmonary:     Effort: Pulmonary effort is normal. No respiratory distress.     Breath sounds: Normal breath sounds. No wheezing.  Abdominal:     General: Bowel sounds are normal. There is no distension.     Palpations: Abdomen is soft. There is no mass.     Tenderness: There is no abdominal tenderness.     Comments: abd is completely soft; nontender, nondistended  Genitourinary:    Rectum: Guaiac result positive.  Skin:    General: Skin is warm and dry.     Coloration: Skin is not pale.  Neurological:     Mental Status: She is alert.     Deep Tendon Reflexes:     Reflex Scores:      Patellar reflexes are 1+ on the right side and 1+ on the left side. Psychiatric:        Mood and Affect: Mood is not anxious or depressed.    Results for orders placed or performed in visit on 02/07/18  POCT Occult Blood Stool  Result Value Ref Range   Fecal Occult Blood, POC Positive (A) Negative   Card #1 Date 1082020    Card #2 Fecal Occult Blod, POC     Card #2 Date     Card #3 Fecal Occult Blood, POC     Card #3 Date        Assessment & Plan:   Problem List Items  Addressed This Visit    None    Visit Diagnoses    Change in stool    -  Primary   heme positive; will refer back to GI for evaluation; reviewed path  report with them; go to ER if s/s of bowel perf, worsening bleeding   Relevant Orders   CBC with Differential/Platelet   Gastrointestinal Pathogen Panel PCR   COMPLETE METABOLIC PANEL WITH GFR   Iron, TIBC and Ferritin Panel   Gastrointestinal Pathogen Panel PCR   POCT Occult Blood Stool (Completed)   Ambulatory referral to Gastroenterology   Iron deficiency anemia, unspecified iron deficiency anemia type       check labs; refer to GI   Relevant Orders   CBC with Differential/Platelet   Iron, TIBC and Ferritin Panel   CBC with Differential/Platelet   Iron, TIBC and Ferritin Panel   POCT Occult Blood Stool (Completed)   Ambulatory referral to Gastroenterology   Colitis       ischemic colitis, hospitalized at [facility] in Nov/Dec; she is currently pain free but still heme-positive and losing weight, refer to GI; to ER if worse   Relevant Orders   CBC with Differential/Platelet   Gastrointestinal Pathogen Panel PCR   CBC with Differential/Platelet   COMPLETE METABOLIC PANEL WITH GFR   Gastrointestinal Pathogen Panel PCR   Ambulatory referral to Gastroenterology   Hypokalemia       Relevant Orders   BASIC METABOLIC PANEL WITH GFR   Magnesium   Weight loss       concerning, talked w/pt and daughter; will get multiple labs today; ordered mammo; refer to GI for ASAP consultation   Relevant Orders   TSH   T4, free   COMPLETE METABOLIC PANEL WITH GFR   US Abdomen Complete   Urinalysis w microscopic + reflex cultur   Ambulatory referral to Gastroenterology   Screening breast examination       Relevant Orders   MM 3D SCREEN BREAST BILATERAL   Hepatic cyst       noted on CT scan, but with significant weight loss; order [place]   Relevant Orders   US Abdomen Complete   Renal lesion       noted on CT scan, but with significant weight loss; order [place]   Relevant Orders   US Abdomen Complete   Urinalysis w microscopic + reflex cultur       Follow up plan: Return in about 2 weeks (around  02/21/2018) for follow-up visit with Dr. Sanda Klein.  An after-visit summary was printed and given to the patient at [place].  Please see the patient instructions which may contain other information and recommendations beyond what is mentioned above in the assessment and plan.  No orders of the defined types were placed in this encounter.   Orders Placed This Encounter  Procedures  . MM 3D SCREEN BREAST BILATERAL  . US Abdomen Complete  . CBC with Differential/Platelet  . BASIC METABOLIC PANEL WITH GFR  . Iron, TIBC and Ferritin Panel  . Gastrointestinal Pathogen Panel PCR  . Magnesium  . TSH  . T4, free  . CBC with Differential/Platelet  . COMPLETE METABOLIC PANEL WITH GFR  . Iron, TIBC and Ferritin Panel  . Gastrointestinal Pathogen Panel PCR  . Urinalysis w microscopic + reflex cultur  . Ambulatory referral to Gastroenterology  . POCT Occult Blood Stool

## 2018-02-07 NOTE — Patient Instructions (Signed)
We'll get labs today Please return the stool test Let me know of any ongoing or new problems or symptoms you have

## 2018-02-08 ENCOUNTER — Other Ambulatory Visit: Payer: Self-pay | Admitting: Family Medicine

## 2018-02-08 DIAGNOSIS — K559 Vascular disorder of intestine, unspecified: Secondary | ICD-10-CM | POA: Diagnosis not present

## 2018-02-08 DIAGNOSIS — M5136 Other intervertebral disc degeneration, lumbar region: Secondary | ICD-10-CM | POA: Diagnosis not present

## 2018-02-08 DIAGNOSIS — G4733 Obstructive sleep apnea (adult) (pediatric): Secondary | ICD-10-CM | POA: Diagnosis not present

## 2018-02-08 DIAGNOSIS — Z9181 History of falling: Secondary | ICD-10-CM | POA: Diagnosis not present

## 2018-02-08 DIAGNOSIS — Z792 Long term (current) use of antibiotics: Secondary | ICD-10-CM | POA: Diagnosis not present

## 2018-02-08 DIAGNOSIS — D509 Iron deficiency anemia, unspecified: Secondary | ICD-10-CM | POA: Diagnosis not present

## 2018-02-08 DIAGNOSIS — M1712 Unilateral primary osteoarthritis, left knee: Secondary | ICD-10-CM | POA: Diagnosis not present

## 2018-02-08 DIAGNOSIS — M1711 Unilateral primary osteoarthritis, right knee: Secondary | ICD-10-CM | POA: Diagnosis not present

## 2018-02-08 DIAGNOSIS — E785 Hyperlipidemia, unspecified: Secondary | ICD-10-CM | POA: Diagnosis not present

## 2018-02-08 DIAGNOSIS — Z7982 Long term (current) use of aspirin: Secondary | ICD-10-CM | POA: Diagnosis not present

## 2018-02-08 DIAGNOSIS — H9190 Unspecified hearing loss, unspecified ear: Secondary | ICD-10-CM | POA: Diagnosis not present

## 2018-02-08 DIAGNOSIS — Z79891 Long term (current) use of opiate analgesic: Secondary | ICD-10-CM | POA: Diagnosis not present

## 2018-02-08 DIAGNOSIS — M4186 Other forms of scoliosis, lumbar region: Secondary | ICD-10-CM | POA: Diagnosis not present

## 2018-02-08 DIAGNOSIS — K5730 Diverticulosis of large intestine without perforation or abscess without bleeding: Secondary | ICD-10-CM | POA: Diagnosis not present

## 2018-02-08 DIAGNOSIS — I10 Essential (primary) hypertension: Secondary | ICD-10-CM | POA: Diagnosis not present

## 2018-02-08 LAB — CBC WITH DIFFERENTIAL/PLATELET
Absolute Monocytes: 525 cells/uL (ref 200–950)
Basophils Absolute: 33 cells/uL (ref 0–200)
Basophils Relative: 0.4 %
Eosinophils Absolute: 246 cells/uL (ref 15–500)
Eosinophils Relative: 3 %
HCT: 31.7 % — ABNORMAL LOW (ref 35.0–45.0)
Hemoglobin: 10.1 g/dL — ABNORMAL LOW (ref 11.7–15.5)
Lymphs Abs: 3788 cells/uL (ref 850–3900)
MCH: 26.4 pg — ABNORMAL LOW (ref 27.0–33.0)
MCHC: 31.9 g/dL — ABNORMAL LOW (ref 32.0–36.0)
MCV: 82.8 fL (ref 80.0–100.0)
MPV: 11.7 fL (ref 7.5–12.5)
Monocytes Relative: 6.4 %
Neutro Abs: 3608 cells/uL (ref 1500–7800)
Neutrophils Relative %: 44 %
Platelets: 211 10*3/uL (ref 140–400)
RBC: 3.83 10*6/uL (ref 3.80–5.10)
RDW: 13.2 % (ref 11.0–15.0)
TOTAL LYMPHOCYTE: 46.2 %
WBC: 8.2 10*3/uL (ref 3.8–10.8)

## 2018-02-08 LAB — COMPLETE METABOLIC PANEL WITH GFR
AG RATIO: 1.2 (calc) (ref 1.0–2.5)
ALT: 5 U/L — AB (ref 6–29)
AST: 14 U/L (ref 10–35)
Albumin: 3.8 g/dL (ref 3.6–5.1)
Alkaline phosphatase (APISO): 47 U/L (ref 33–130)
BUN: 16 mg/dL (ref 7–25)
CO2: 30 mmol/L (ref 20–32)
Calcium: 9.5 mg/dL (ref 8.6–10.4)
Chloride: 103 mmol/L (ref 98–110)
Creat: 0.68 mg/dL (ref 0.60–0.88)
GFR, Est African American: 93 mL/min/{1.73_m2} (ref >=60)
GFR, Est Non African American: 80 mL/min/{1.73_m2} (ref >=60)
GLOBULIN: 3.3 g/dL (ref 1.9–3.7)
Glucose, Bld: 90 mg/dL (ref 65–99)
Potassium: 3.4 mmol/L — ABNORMAL LOW (ref 3.5–5.3)
Sodium: 141 mmol/L (ref 135–146)
Total Bilirubin: 0.6 mg/dL (ref 0.2–1.2)
Total Protein: 7.1 g/dL (ref 6.1–8.1)

## 2018-02-08 LAB — URINALYSIS W MICROSCOPIC + REFLEX CULTURE
BILIRUBIN URINE: NEGATIVE
Bacteria, UA: NONE SEEN /HPF
Glucose, UA: NEGATIVE
Hgb urine dipstick: NEGATIVE
Hyaline Cast: NONE SEEN /LPF
Ketones, ur: NEGATIVE
Leukocyte Esterase: NEGATIVE
Nitrites, Initial: NEGATIVE
Protein, ur: NEGATIVE
RBC / HPF: NONE SEEN /HPF (ref 0–2)
Specific Gravity, Urine: 1.016 (ref 1.001–1.03)
Squamous Epithelial / HPF: NONE SEEN /HPF (ref <=5)
WBC, UA: NONE SEEN /HPF (ref 0–5)
pH: 7 (ref 5.0–8.0)

## 2018-02-08 LAB — T4, FREE: Free T4: 1.2 ng/dL (ref 0.8–1.8)

## 2018-02-08 LAB — NO CULTURE INDICATED

## 2018-02-08 LAB — IRON,TIBC AND FERRITIN PANEL
%SAT: 17 % (calc) (ref 16–45)
Ferritin: 232 ng/mL (ref 16–288)
Iron: 47 ug/dL (ref 45–160)
TIBC: 271 mcg/dL (calc) (ref 250–450)

## 2018-02-08 LAB — TSH: TSH: 0.65 mIU/L (ref 0.40–4.50)

## 2018-02-08 MED ORDER — POTASSIUM CHLORIDE ER 10 MEQ PO TBCR: 10.0000 meq | EXTENDED_RELEASE_TABLET | ORAL | 0 refills | Status: DC

## 2018-02-08 NOTE — Progress Notes (Signed)
Supplement K+ Continue iron for one more month, then stop (unless changes between now and then, e.g. more bleeding)

## 2018-02-10 ENCOUNTER — Other Ambulatory Visit: Payer: Self-pay | Admitting: Family Medicine

## 2018-02-12 DIAGNOSIS — G4733 Obstructive sleep apnea (adult) (pediatric): Secondary | ICD-10-CM | POA: Diagnosis not present

## 2018-02-12 DIAGNOSIS — Z792 Long term (current) use of antibiotics: Secondary | ICD-10-CM | POA: Diagnosis not present

## 2018-02-12 DIAGNOSIS — H9190 Unspecified hearing loss, unspecified ear: Secondary | ICD-10-CM | POA: Diagnosis not present

## 2018-02-12 DIAGNOSIS — E785 Hyperlipidemia, unspecified: Secondary | ICD-10-CM | POA: Diagnosis not present

## 2018-02-12 DIAGNOSIS — M1712 Unilateral primary osteoarthritis, left knee: Secondary | ICD-10-CM | POA: Diagnosis not present

## 2018-02-12 DIAGNOSIS — Z9181 History of falling: Secondary | ICD-10-CM | POA: Diagnosis not present

## 2018-02-12 DIAGNOSIS — K559 Vascular disorder of intestine, unspecified: Secondary | ICD-10-CM | POA: Diagnosis not present

## 2018-02-12 DIAGNOSIS — Z7982 Long term (current) use of aspirin: Secondary | ICD-10-CM | POA: Diagnosis not present

## 2018-02-12 DIAGNOSIS — I10 Essential (primary) hypertension: Secondary | ICD-10-CM | POA: Diagnosis not present

## 2018-02-12 DIAGNOSIS — M5136 Other intervertebral disc degeneration, lumbar region: Secondary | ICD-10-CM | POA: Diagnosis not present

## 2018-02-12 DIAGNOSIS — K5730 Diverticulosis of large intestine without perforation or abscess without bleeding: Secondary | ICD-10-CM | POA: Diagnosis not present

## 2018-02-12 DIAGNOSIS — M1711 Unilateral primary osteoarthritis, right knee: Secondary | ICD-10-CM | POA: Diagnosis not present

## 2018-02-12 DIAGNOSIS — M4186 Other forms of scoliosis, lumbar region: Secondary | ICD-10-CM | POA: Diagnosis not present

## 2018-02-12 DIAGNOSIS — D509 Iron deficiency anemia, unspecified: Secondary | ICD-10-CM | POA: Diagnosis not present

## 2018-02-12 DIAGNOSIS — Z79891 Long term (current) use of opiate analgesic: Secondary | ICD-10-CM | POA: Diagnosis not present

## 2018-02-14 ENCOUNTER — Ambulatory Visit
Admission: RE | Admit: 2018-02-14 | Discharge: 2018-02-14 | Disposition: A | Discharge status: Home / Self Care | Payer: Medicare Other | Source: Ambulatory Visit | Attending: Family Medicine | Admitting: Family Medicine

## 2018-02-14 DIAGNOSIS — K5289 Other specified noninfective gastroenteritis and colitis: Secondary | ICD-10-CM | POA: Diagnosis not present

## 2018-02-14 DIAGNOSIS — R195 Other fecal abnormalities: Secondary | ICD-10-CM | POA: Diagnosis not present

## 2018-02-14 DIAGNOSIS — N289 Disorder of kidney and ureter, unspecified: Secondary | ICD-10-CM | POA: Diagnosis not present

## 2018-02-14 DIAGNOSIS — Q446 Cystic disease of liver: Secondary | ICD-10-CM | POA: Diagnosis not present

## 2018-02-14 DIAGNOSIS — R634 Abnormal weight loss: Secondary | ICD-10-CM | POA: Insufficient documentation

## 2018-02-14 DIAGNOSIS — K7689 Other specified diseases of liver: Secondary | ICD-10-CM | POA: Insufficient documentation

## 2018-02-14 DIAGNOSIS — D509 Iron deficiency anemia, unspecified: Secondary | ICD-10-CM | POA: Diagnosis not present

## 2018-02-14 DIAGNOSIS — K529 Noninfective gastroenteritis and colitis, unspecified: Secondary | ICD-10-CM | POA: Diagnosis not present

## 2018-02-15 DIAGNOSIS — K5730 Diverticulosis of large intestine without perforation or abscess without bleeding: Secondary | ICD-10-CM | POA: Diagnosis not present

## 2018-02-15 DIAGNOSIS — H9190 Unspecified hearing loss, unspecified ear: Secondary | ICD-10-CM | POA: Diagnosis not present

## 2018-02-15 DIAGNOSIS — E785 Hyperlipidemia, unspecified: Secondary | ICD-10-CM | POA: Diagnosis not present

## 2018-02-15 DIAGNOSIS — G4733 Obstructive sleep apnea (adult) (pediatric): Secondary | ICD-10-CM | POA: Diagnosis not present

## 2018-02-15 DIAGNOSIS — Z9181 History of falling: Secondary | ICD-10-CM | POA: Diagnosis not present

## 2018-02-15 DIAGNOSIS — M5136 Other intervertebral disc degeneration, lumbar region: Secondary | ICD-10-CM | POA: Diagnosis not present

## 2018-02-15 DIAGNOSIS — K559 Vascular disorder of intestine, unspecified: Secondary | ICD-10-CM | POA: Diagnosis not present

## 2018-02-15 DIAGNOSIS — D509 Iron deficiency anemia, unspecified: Secondary | ICD-10-CM | POA: Diagnosis not present

## 2018-02-15 DIAGNOSIS — Z7982 Long term (current) use of aspirin: Secondary | ICD-10-CM | POA: Diagnosis not present

## 2018-02-15 DIAGNOSIS — Z792 Long term (current) use of antibiotics: Secondary | ICD-10-CM | POA: Diagnosis not present

## 2018-02-15 DIAGNOSIS — M4186 Other forms of scoliosis, lumbar region: Secondary | ICD-10-CM | POA: Diagnosis not present

## 2018-02-15 DIAGNOSIS — Z79891 Long term (current) use of opiate analgesic: Secondary | ICD-10-CM | POA: Diagnosis not present

## 2018-02-15 DIAGNOSIS — I10 Essential (primary) hypertension: Secondary | ICD-10-CM | POA: Diagnosis not present

## 2018-02-15 DIAGNOSIS — M1711 Unilateral primary osteoarthritis, right knee: Secondary | ICD-10-CM | POA: Diagnosis not present

## 2018-02-15 DIAGNOSIS — M1712 Unilateral primary osteoarthritis, left knee: Secondary | ICD-10-CM | POA: Diagnosis not present

## 2018-02-15 LAB — GASTROINTESTINAL PATHOGEN PANEL PCR
C. DIFFICILE TOX A/B, PCR: NOT DETECTED
Campylobacter, PCR: NOT DETECTED
Cryptosporidium, PCR: NOT DETECTED
E coli (ETEC) LT/ST PCR: NOT DETECTED
E coli (STEC) stx1/stx2, PCR: NOT DETECTED
E coli 0157, PCR: NOT DETECTED
Giardia lamblia, PCR: NOT DETECTED
Norovirus, PCR: NOT DETECTED
Rotavirus A, PCR: NOT DETECTED
SALMONELLA, PCR: NOT DETECTED
Shigella, PCR: NOT DETECTED

## 2018-02-20 DIAGNOSIS — G4733 Obstructive sleep apnea (adult) (pediatric): Secondary | ICD-10-CM | POA: Diagnosis not present

## 2018-02-20 DIAGNOSIS — M4186 Other forms of scoliosis, lumbar region: Secondary | ICD-10-CM | POA: Diagnosis not present

## 2018-02-20 DIAGNOSIS — Z792 Long term (current) use of antibiotics: Secondary | ICD-10-CM | POA: Diagnosis not present

## 2018-02-20 DIAGNOSIS — Z79891 Long term (current) use of opiate analgesic: Secondary | ICD-10-CM | POA: Diagnosis not present

## 2018-02-20 DIAGNOSIS — E785 Hyperlipidemia, unspecified: Secondary | ICD-10-CM | POA: Diagnosis not present

## 2018-02-20 DIAGNOSIS — K559 Vascular disorder of intestine, unspecified: Secondary | ICD-10-CM | POA: Diagnosis not present

## 2018-02-20 DIAGNOSIS — H9190 Unspecified hearing loss, unspecified ear: Secondary | ICD-10-CM | POA: Diagnosis not present

## 2018-02-20 DIAGNOSIS — M1711 Unilateral primary osteoarthritis, right knee: Secondary | ICD-10-CM | POA: Diagnosis not present

## 2018-02-20 DIAGNOSIS — Z9181 History of falling: Secondary | ICD-10-CM | POA: Diagnosis not present

## 2018-02-20 DIAGNOSIS — Z7982 Long term (current) use of aspirin: Secondary | ICD-10-CM | POA: Diagnosis not present

## 2018-02-20 DIAGNOSIS — D509 Iron deficiency anemia, unspecified: Secondary | ICD-10-CM | POA: Diagnosis not present

## 2018-02-20 DIAGNOSIS — M5136 Other intervertebral disc degeneration, lumbar region: Secondary | ICD-10-CM | POA: Diagnosis not present

## 2018-02-20 DIAGNOSIS — M1712 Unilateral primary osteoarthritis, left knee: Secondary | ICD-10-CM | POA: Diagnosis not present

## 2018-02-20 DIAGNOSIS — I10 Essential (primary) hypertension: Secondary | ICD-10-CM | POA: Diagnosis not present

## 2018-02-20 DIAGNOSIS — K5730 Diverticulosis of large intestine without perforation or abscess without bleeding: Secondary | ICD-10-CM | POA: Diagnosis not present

## 2018-02-22 ENCOUNTER — Telehealth: Payer: Self-pay | Admitting: Family Medicine

## 2018-02-22 ENCOUNTER — Ambulatory Visit (INDEPENDENT_AMBULATORY_CARE_PROVIDER_SITE_OTHER): Payer: Medicare Other | Admitting: Family Medicine

## 2018-02-22 ENCOUNTER — Encounter: Payer: Self-pay | Admitting: Family Medicine

## 2018-02-22 VITALS — BP 110/62 | HR 86 | Temp 98.2°F | Ht 63.0 in | Wt 159.0 lb

## 2018-02-22 DIAGNOSIS — D649 Anemia, unspecified: Secondary | ICD-10-CM

## 2018-02-22 DIAGNOSIS — R933 Abnormal findings on diagnostic imaging of other parts of digestive tract: Secondary | ICD-10-CM | POA: Diagnosis not present

## 2018-02-22 DIAGNOSIS — R195 Other fecal abnormalities: Secondary | ICD-10-CM

## 2018-02-22 NOTE — Telephone Encounter (Signed)
That's fine

## 2018-02-22 NOTE — Progress Notes (Signed)
BP 110/62   Pulse 86   Temp 98.2 F (36.8 C)   Ht 5\' 3"  (1.6 m)   Wt 159 lb (72.1 kg)   SpO2 94%   BMI 28.17 kg/m    Subjective:   BP Readings from Last 3 Encounters:  02/22/18 110/62  02/07/18 136/64  01/11/18 118/62     Patient ID: Jocelyn [name], female    DOB: 1931-08-30, 85 y.o.   MRN: [id number]  HPI: Jocelyn [name] is a 85 y.o. female  Chief Complaint  Patient presents with  . Follow-up    HPI Here for f/u She has actually gained 6 pounds She has been eating more; appetite is back I asked about her bowel habits; she had a BM yesterday, 2 on Monday; they are still discolored Thyroid tests and stool studies were normal on 02/07/2018 Pain clinic appt is Feb 4th GI appt is Feb 11th No abdominal pain  No fever Trying to get green beans and turnip greens Hypokalemia; I wrote a prescription for Klor three times a week, but never started it; got it mixed up with another medicine; they'll get that today; no palpitations and no leg cramps  Depression screen [facility] 2/9 02/22/2018 02/07/2018 01/11/2018 12/08/2017 06/13/2017  Decreased Interest 0 0 0 0 0  Down, Depressed, Hopeless 0 0 0 0 1  PHQ - 2 Score 0 0 0 0 1  Altered sleeping 0 0 0 0 -  Tired, decreased energy 0 0 0 0 -  Change in appetite 0 0 0 0 -  Feeling bad or failure about yourself  0 0 0 0 -  Trouble concentrating 0 0 0 0 -  Moving slowly or fidgety/restless 0 0 0 0 -  Suicidal thoughts 0 0 0 0 -  PHQ-9 Score 0 0 0 0 -  Difficult doing work/chores Not difficult at all Not difficult at all Not difficult at all Not difficult at all -   Fall Risk  02/22/2018 02/07/2018 12/08/2017 06/13/2017 04/07/2017  Falls in the past year? 0 0 0 No No  Number falls in past yr: - 0 0 - -  Injury with Fall? - 0 0 - -    Relevant past medical, surgical, family and social history reviewed Past Medical History:  Diagnosis Date  . Arthritis of both knees 08/01/2015  . Colitis   . Essential hypertension, benign 07/28/2014  . GERD  (gastroesophageal reflux disease)   . Hx of iron deficiency anemia 09/02/2015  . Hyperlipidemia   . Hypertension   . Lumbar pain   . OSA on CPAP 08/01/2015  . Overweight (BMI 25.0-29.9) 07/10/2013  . Prediabetes 09/02/2015   Past Surgical History:  Procedure Laterality Date  . ABDOMINAL HYSTERECTOMY    . CATARACT EXTRACTION    . CHOLECYSTECTOMY    . LUMBAR DISC SURGERY     Family History  Problem Relation Age of Onset  . Hypertension Daughter   . Diabetes Daughter   . Cancer Daughter        breast  . Diabetes Mother   . Heart disease Mother   . Cancer Father        lung; smoker  . Anuerysm Son    Social History   Tobacco Use  . Smoking status: Never Smoker  . Smokeless tobacco: Never Used  Substance Use Topics  . Alcohol use: No    Alcohol/week: 0.0 standard drinks  . Drug use: No     Office Visit from 02/22/2018 in  [facility]  AUDIT-C Score  0      Interim medical history since last visit reviewed. Allergies and medications reviewed  Review of Systems Per HPI unless specifically indicated above     Objective:    BP 110/62   Pulse 86   Temp 98.2 F (36.8 C)   Ht 5\' 3"  (1.6 m)   Wt 159 lb (72.1 kg)   SpO2 94%   BMI 28.17 kg/m   Wt Readings from Last 3 Encounters:  02/22/18 159 lb (72.1 kg)  02/07/18 152 lb 14.4 oz (69.4 kg)  01/11/18 162 lb 3.2 oz (73.6 kg)    Physical Exam Constitutional:      General: She is not in acute distress.    Appearance: She is well-developed. She is not diaphoretic.     Comments: Weight gain noted  HENT:     Head: Normocephalic and atraumatic.  Eyes:     General: No scleral icterus. Neck:     Thyroid: No thyromegaly.  Cardiovascular:     Rate and Rhythm: Normal rate and regular rhythm.     Heart sounds: Normal heart sounds. No murmur.  Pulmonary:     Effort: Pulmonary effort is normal. No respiratory distress.     Breath sounds: Normal breath sounds. No wheezing.  Abdominal:     General: Bowel  sounds are normal. There is no distension.     Palpations: Abdomen is soft.     Tenderness: There is no abdominal tenderness. There is no guarding.  Musculoskeletal:     Right lower leg: No edema.     Left lower leg: No edema.  Skin:    General: Skin is warm and dry.     Coloration: Skin is not pale.  Neurological:     Mental Status: She is alert.  Psychiatric:        Mood and Affect: Mood is not anxious or depressed.     Results for orders placed or performed in visit on 02/07/18  TSH  Result Value Ref Range   TSH 0.65 0.40 - 4.50 mIU/L  T4, free  Result Value Ref Range   Free T4 1.2 0.8 - 1.8 ng/dL  CBC with Differential/Platelet  Result Value Ref Range   WBC 8.2 3.8 - 10.8 Thousand/uL   RBC 3.83 3.80 - 5.10 Million/uL   Hemoglobin 10.1 (L) 11.7 - 15.5 g/dL   HCT 31.7 (L) 35.0 - 45.0 %   MCV 82.8 80.0 - 100.0 fL   MCH 26.4 (L) 27.0 - 33.0 pg   MCHC 31.9 (L) 32.0 - 36.0 g/dL   RDW 13.2 11.0 - 15.0 %   Platelets 211 140 - 400 Thousand/uL   MPV 11.7 7.5 - 12.5 fL   Neutro Abs 3,608 1,500 - 7,800 cells/uL   Lymphs Abs 3,788 850 - 3,900 cells/uL   Absolute Monocytes 525 200 - 950 cells/uL   Eosinophils Absolute 246 15 - 500 cells/uL   Basophils Absolute 33 0 - 200 cells/uL   Neutrophils Relative % 44 %   Total Lymphocyte 46.2 %   Monocytes Relative 6.4 %   Eosinophils Relative 3.0 %   Basophils Relative 0.4 %  COMPLETE METABOLIC PANEL WITH GFR  Result Value Ref Range   Glucose, Bld 90 65 - 99 mg/dL   BUN 16 7 - 25 mg/dL   Creat 0.68 0.60 - 0.88 mg/dL   GFR, Est Non African American 80 > OR = 60 mL/min/1.49m2   GFR, Est African  American 93 > OR = 60 mL/min/1.5m2   BUN/Creatinine Ratio NOT APPLICABLE 6 - 22 (calc)   Sodium 141 135 - 146 mmol/L   Potassium 3.4 (L) 3.5 - 5.3 mmol/L   Chloride 103 98 - 110 mmol/L   CO2 30 20 - 32 mmol/L   Calcium 9.5 8.6 - 10.4 mg/dL   Total Protein 7.1 6.1 - 8.1 g/dL   Albumin 3.8 3.6 - 5.1 g/dL   Globulin 3.3 1.9 - 3.7 g/dL  (calc)   AG Ratio 1.2 1.0 - 2.5 (calc)   Total Bilirubin 0.6 0.2 - 1.2 mg/dL   Alkaline phosphatase (APISO) 47 33 - 130 U/L   AST 14 10 - 35 U/L   ALT 5 (L) 6 - 29 U/L  Iron, TIBC and Ferritin Panel  Result Value Ref Range   Iron 47 45 - 160 mcg/dL   TIBC 271 250 - 450 mcg/dL (calc)   %SAT 17 16 - 45 % (calc)   Ferritin 232 16 - 288 ng/mL  Gastrointestinal Pathogen Panel PCR  Result Value Ref Range   Campylobacter, PCR NOT DETECTED NOT DETECT   C. difficile Tox A/B, PCR NOT DETECTED NOT DETECT   E coli 0157, PCR NOT DETECTED NOT DETECT   E coli (ETEC) LT/ST PCR NOT DETECTED NOT DETECT   E coli (STEC) stx1/stx2, PCR NOT DETECTED NOT DETECT   Salmonella, PCR NOT DETECTED NOT DETECT   Shigella, PCR NOT DETECTED NOT DETECT   Norovirus, PCR NOT DETECTED NOT DETECT   Rotavirus A, PCR NOT DETECTED NOT DETECT   Giardia lamblia, PCR NOT DETECTED NOT DETECT   Cryptosporidium, PCR NOT DETECTED NOT DETECT  Urinalysis w microscopic + reflex cultur  Result Value Ref Range   Color, Urine YELLOW YELLOW   APPearance CLEAR CLEAR   Specific Gravity, Urine 1.016 1.001 - 1.03   pH 7.0 5.0 - 8.0   Glucose, UA NEGATIVE NEGATIVE   Bilirubin Urine NEGATIVE NEGATIVE   Ketones, ur NEGATIVE NEGATIVE   Hgb urine dipstick NEGATIVE NEGATIVE   Protein, ur NEGATIVE NEGATIVE   Nitrites, Initial NEGATIVE NEGATIVE   Leukocyte Esterase NEGATIVE NEGATIVE   WBC, UA NONE SEEN 0 - 5 /HPF   RBC / HPF NONE SEEN 0 - 2 /HPF   Squamous Epithelial / LPF NONE SEEN < OR = 5 /HPF   Bacteria, UA NONE SEEN NONE SEEN /HPF   Hyaline Cast NONE SEEN NONE SEEN /LPF  REFLEXIVE URINE CULTURE  Result Value Ref Range   Reflexve Urine Culture NO CULTURE INDICATED   POCT Occult Blood Stool  Result Value Ref Range   Fecal Occult Blood, POC Positive (A) Negative   Card #1 Date 1082020    Card #2 Fecal Occult Blod, POC     Card #2 Date     Card #3 Fecal Occult Blood, POC     Card #3 Date        Assessment & Plan:   Problem  List Items Addressed This Visit    None    Visit Diagnoses    Abnormal colonoscopy    -  Primary   trying to get patient in with GI specialist; note sent to see if appt can be moved up; if bleeding, pain, etc, then patient to go to the ER   Heme positive stool       awaiting GI appointment; reveiwed last CBCs; hospitalized earlier; had colonoscopy; hoping to get patient in quickly to GI   Anemia, unspecified type  pink capillary beds in fingernails; no pallor; referred to GI to evaluate GI blood loss       Follow up plan: Return in about 3 months (around 05/24/2018) for follow-up visit with Dr. Sanda Klein.  An after-visit summary was printed and given to the patient at [place].  Please see the patient instructions which may contain other information and recommendations beyond what is mentioned above in the assessment and plan.  No orders of the defined types were placed in this encounter.   No orders of the defined types were placed in this encounter.

## 2018-02-22 NOTE — Telephone Encounter (Signed)
Copied from [place] [id number]. Topic: Quick Communication - Home Health Verbal Orders >> Feb 22, 2018  9:23 AM Margot Ables wrote: Caller/Agency: Leda Gauze PT w/[facility] Number: [phone], secure VM if not available Requesting OT/PT/Skilled Nursing/Social Work: PT Frequency: effective 02/11/2018 requesting VO to extend PT 2x week for 3 weeks

## 2018-02-23 ENCOUNTER — Other Ambulatory Visit: Payer: Self-pay

## 2018-02-23 ENCOUNTER — Ambulatory Visit (INDEPENDENT_AMBULATORY_CARE_PROVIDER_SITE_OTHER): Payer: Medicare Other | Admitting: Gastroenterology

## 2018-02-23 ENCOUNTER — Ambulatory Visit: Payer: Medicare Other | Admitting: Gastroenterology

## 2018-02-23 ENCOUNTER — Encounter: Payer: Self-pay | Admitting: Gastroenterology

## 2018-02-23 VITALS — BP 137/61 | HR 73 | Resp 17 | Ht 63.0 in | Wt 159.0 lb

## 2018-02-23 DIAGNOSIS — Z8719 Personal history of other diseases of the digestive system: Secondary | ICD-10-CM

## 2018-02-23 DIAGNOSIS — I10 Essential (primary) hypertension: Secondary | ICD-10-CM | POA: Diagnosis not present

## 2018-02-23 DIAGNOSIS — K5289 Other specified noninfective gastroenteritis and colitis: Secondary | ICD-10-CM | POA: Diagnosis not present

## 2018-02-23 DIAGNOSIS — E785 Hyperlipidemia, unspecified: Secondary | ICD-10-CM | POA: Diagnosis not present

## 2018-02-23 DIAGNOSIS — M5136 Other intervertebral disc degeneration, lumbar region: Secondary | ICD-10-CM | POA: Diagnosis not present

## 2018-02-23 DIAGNOSIS — Z9181 History of falling: Secondary | ICD-10-CM | POA: Diagnosis not present

## 2018-02-23 DIAGNOSIS — K559 Vascular disorder of intestine, unspecified: Secondary | ICD-10-CM | POA: Diagnosis not present

## 2018-02-23 DIAGNOSIS — Z7982 Long term (current) use of aspirin: Secondary | ICD-10-CM | POA: Diagnosis not present

## 2018-02-23 DIAGNOSIS — Z79891 Long term (current) use of opiate analgesic: Secondary | ICD-10-CM | POA: Diagnosis not present

## 2018-02-23 DIAGNOSIS — K5730 Diverticulosis of large intestine without perforation or abscess without bleeding: Secondary | ICD-10-CM | POA: Diagnosis not present

## 2018-02-23 DIAGNOSIS — G4733 Obstructive sleep apnea (adult) (pediatric): Secondary | ICD-10-CM | POA: Diagnosis not present

## 2018-02-23 DIAGNOSIS — K6389 Other specified diseases of intestine: Secondary | ICD-10-CM

## 2018-02-23 DIAGNOSIS — H9190 Unspecified hearing loss, unspecified ear: Secondary | ICD-10-CM | POA: Diagnosis not present

## 2018-02-23 DIAGNOSIS — D500 Iron deficiency anemia secondary to blood loss (chronic): Secondary | ICD-10-CM | POA: Diagnosis not present

## 2018-02-23 DIAGNOSIS — M4186 Other forms of scoliosis, lumbar region: Secondary | ICD-10-CM | POA: Diagnosis not present

## 2018-02-23 DIAGNOSIS — Z792 Long term (current) use of antibiotics: Secondary | ICD-10-CM | POA: Diagnosis not present

## 2018-02-23 DIAGNOSIS — D509 Iron deficiency anemia, unspecified: Secondary | ICD-10-CM | POA: Diagnosis not present

## 2018-02-23 DIAGNOSIS — M1712 Unilateral primary osteoarthritis, left knee: Secondary | ICD-10-CM | POA: Diagnosis not present

## 2018-02-23 DIAGNOSIS — M1711 Unilateral primary osteoarthritis, right knee: Secondary | ICD-10-CM | POA: Diagnosis not present

## 2018-02-23 NOTE — Progress Notes (Signed)
Cephas Darby, MD [address]  [place]  [place], [place] [address]  Main: [phone]  Fax: [phone]    Gastroenterology Consultation  Referring Provider:     Arnetha Courser, MD Primary Care Physician:  Arnetha Courser, MD Primary Gastroenterologist:  Dr. Cephas Darby Reason for Consultation:     Dark-colored stools, abnormal colonoscopy and a CT        HPI:   Jocelyn [name] is a 85 y.o. female referred by Dr. Sanda Klein, Satira Anis, MD  for consultation & management of abnormal colonoscopy and CT from recent admission at [facility].  Patient was hospitalized at [facility] on 12/30/2017 when she presented with right-sided abdominal pain.  At that time, CT revealed pneumatosis and focal thickening in the right colon including cecum.  Subsequently, patient underwent colonoscopy on 01/01/2018 which revealed ulcerated abnormal mucosa in the cecum and proximal ascending colon and biopsies were performed in addition to possible mass in the cecum proximal to the IC valve which did not have classic appearance of adenocarcinoma.  Biopsies of this were also performed.  Pathology results did not confirm malignancy.   She was seen by Dr. Sanda Klein as a hospitalization follow-up in early December, 2019.  She was found to have mild anemia, hemoglobin 10, Started on oral iron.  Dr. Sanda Klein saw her during follow-up, as patient was complaining of dark-colored stools she was concerned about ongoing GI bleed and placed an urgent referral to see me in my office  Patient reports that she has been doing well, very happy.  She denies abdominal pain.  She reports her stools are dark green to dark brown.  She is taking oral iron once a day and her stools are formed.  She denies fatigue.  Her repeat hemoglobin was 10.1 about 2 weeks ago.  Ferritin and, iron, TIBC normal  NSAIDs: None  Antiplts/Anticoagulants/Anti thrombotics: None  GI Procedures:  Colonoscopy 01/01/2018 at [facility] - Normal terminal ileum.            - Diverticulosis in the left colon.           - Abnormal mucosa characterized by complete mucosal            denudation with adherent yellowish mucus/sloughing tissue.            The underlying tissue was firm in most places but in the            base of the cecum had an appearance concerning for            possible pneumatosis. Target biopsies were obtained.           - Rule out malignancy, firm mass in the cecum. Biopsied.           Differential for endoscopic findings includes malignancy,            infiltrative process, ischemia though the tissue did not            have a typical appearance for any of these etiologies.            Biopsies sent rushed to pathology today. A: Colon, cecum, biopsy - Infarcted colonic mucosa and submucosa with dystrophic calcification and degenerating blood pigments, see comment - No tumor identified   Past Medical History:  Diagnosis Date  . Arthritis of both knees 08/01/2015  . Colitis   . Essential hypertension, benign 07/28/2014  . GERD (gastroesophageal reflux disease)   . Hx of iron  deficiency anemia 09/02/2015  . Hyperlipidemia   . Hypertension   . Lumbar pain   . OSA on CPAP 08/01/2015  . Overweight (BMI 25.0-29.9) 07/10/2013  . Prediabetes 09/02/2015    Past Surgical History:  Procedure Laterality Date  . ABDOMINAL HYSTERECTOMY    . CATARACT EXTRACTION    . CHOLECYSTECTOMY    . LUMBAR DISC SURGERY      Current Outpatient Medications:  .  aspirin EC 81 MG tablet, Take 1 tablet (81 mg total) by mouth daily., Disp: 30 tablet, Rfl: 12 .  calcium-vitamin D 250-100 MG-UNIT tablet, Take 1 tablet by mouth 2 (two) times daily., Disp: , Rfl:  .  cloNIDine (CATAPRES) 0.1 MG tablet, TAKE 1 TABLET (0.1 MG TOTAL) BY MOUTH EVERY 8 (EIGHT) HOURS., Disp: 270 tablet, Rfl: 3 .  diclofenac sodium (VOLTAREN) 1 % GEL, Apply 4 g topically 4 (four) times daily as needed. On the  knees, Disp: 100 g, Rfl: 5 .  famotidine (PEPCID) 20 MG tablet, Take 1 tablet (20 mg total) by mouth 2 (two) times daily. If needed for heartburn; do not take ranitidine, Disp: 60 tablet, Rfl: 2 .  ferrous sulfate 325 (65 FE) MG tablet, Take 325 mg by mouth daily., Disp: , Rfl:  .  gabapentin (NEURONTIN) 100 MG capsule, Take 100 mg by mouth daily., Disp: , Rfl: 3 .  HYDROcodone-acetaminophen (NORCO/VICODIN) 5-325 MG tablet, Take 1 tablet by mouth every 8 (eight) hours as needed for moderate pain., Disp: 30 tablet, Rfl: 0 .  isosorbide mononitrate (IMDUR) 120 MG 24 hr tablet, TAKE 1 TABLET BY MOUTH EVERY DAY, Disp: 90 tablet, Rfl: 3 .  Multiple Vitamins-Minerals (CENTRUM SILVER 50+WOMEN) TABS, Take 1 tablet by mouth daily., Disp: , Rfl:  .  Olmesartan-amLODIPine-HCTZ 40-10-12.5 MG TABS, Take 1 tablet by mouth daily. For blood pressure, Disp: 90 tablet, Rfl: 3 .  potassium chloride (KLOR-CON 10) 10 MEQ tablet, Take 1 tablet (10 mEq total) by mouth 3 (three) times a week., Disp: 12 tablet, Rfl: 0 .  pravastatin (PRAVACHOL) 20 MG tablet, Take one pill by mouth two nights each week (Wednesdays and Sundays, for example), Disp: 26 tablet, Rfl: 1 .  albuterol (PROVENTIL HFA;VENTOLIN HFA) 108 (90 Base) MCG/ACT inhaler, Inhale into the lungs., Disp: , Rfl:    Family History  Problem Relation Age of Onset  . Hypertension Daughter   . Diabetes Daughter   . Cancer Daughter        breast  . Diabetes Mother   . Heart disease Mother   . Cancer Father        lung; smoker  . Anuerysm Son      Social History   Tobacco Use  . Smoking status: Never Smoker  . Smokeless tobacco: Never Used  Substance Use Topics  . Alcohol use: No    Alcohol/week: 0.0 standard drinks  . Drug use: No    Allergies as of 02/23/2018  . (No Known Allergies)    Review of Systems:    All systems reviewed and negative except where noted in HPI.   Physical Exam:  BP 137/61 (BP Location: Left Arm, Patient Position:  Sitting, Cuff Size: Large)   Pulse 73   Resp 17   Ht 5\' 3"  (1.6 m)   Wt 159 lb (72.1 kg)   BMI 28.17 kg/m  No LMP recorded. Patient is postmenopausal.  General:   Alert,  Well-developed, well-nourished, pleasant and cooperative in NAD Head:  Normocephalic and atraumatic. Eyes:  Sclera clear,  no icterus.   Conjunctiva pink. Ears:  Normal auditory acuity. Nose:  No deformity, discharge, or lesions. Mouth:  No deformity or lesions,oropharynx pink & moist. Neck:  Supple; no masses or thyromegaly. Lungs:  Respirations even and unlabored.  Clear throughout to auscultation.   No wheezes, crackles, or rhonchi. No acute distress. Heart:  Regular rate and rhythm; no murmurs, clicks, rubs, or gallops. Abdomen:  Normal bowel sounds. Soft, non-tender and non-distended without masses, hepatosplenomegaly or hernias noted.  No guarding or rebound tenderness.   Rectal: Not performed Msk:  Symmetrical without gross deformities. Good, equal movement & strength bilaterally. Pulses:  Normal pulses noted. Extremities:  No clubbing or edema.  No cyanosis. Neurologic:  Alert and oriented x3;  grossly normal neurologically. Skin:  Intact without significant lesions or rashes. No jaundice. Psych:  Alert and cooperative. Normal mood and affect.  Imaging Studies: Reviewed  Assessment and Plan:   Jocelyn [name] is a 85 y.o. pleasant African-American female with hypertension, hyperlipidemia hospitalization at [facility] on 12/30/2017 secondary to right lower abdominal pain, CT revealed right colon thickening, colonoscopy revealed ulcerated lesion in the cecum and inflamed right colon.  Pathology did not reveal evidence of malignancy.  Her findings are highly consistent with right-sided ischemic colitis.  Discolored stools are most likely secondary to oral iron intake.  Her hemoglobin has been stable  Discontinue oral iron Repeat CBC today Check B12 and folate levels Recommend follow-up CT abdomen with contrast to  evaluate the right colon   Follow up in 1 month   Cephas Darby, MD

## 2018-02-23 NOTE — Progress Notes (Signed)
Ct abdomen pelvis with contrast has been ordered and scheduled 03/01/2018 pt has been notified

## 2018-02-23 NOTE — Telephone Encounter (Signed)
Verbal orders given  

## 2018-02-26 ENCOUNTER — Other Ambulatory Visit: Payer: Self-pay

## 2018-02-26 DIAGNOSIS — D500 Iron deficiency anemia secondary to blood loss (chronic): Secondary | ICD-10-CM

## 2018-02-26 DIAGNOSIS — K6389 Other specified diseases of intestine: Secondary | ICD-10-CM

## 2018-02-26 NOTE — Addendum Note (Signed)
Addended byGlennie Isle E on: 02/26/2018 02:38 PM   Modules accepted: Orders

## 2018-02-27 DIAGNOSIS — Z79891 Long term (current) use of opiate analgesic: Secondary | ICD-10-CM | POA: Diagnosis not present

## 2018-02-27 DIAGNOSIS — Z9181 History of falling: Secondary | ICD-10-CM | POA: Diagnosis not present

## 2018-02-27 DIAGNOSIS — M4186 Other forms of scoliosis, lumbar region: Secondary | ICD-10-CM | POA: Diagnosis not present

## 2018-02-27 DIAGNOSIS — Z7982 Long term (current) use of aspirin: Secondary | ICD-10-CM | POA: Diagnosis not present

## 2018-02-27 DIAGNOSIS — K5730 Diverticulosis of large intestine without perforation or abscess without bleeding: Secondary | ICD-10-CM | POA: Diagnosis not present

## 2018-02-27 DIAGNOSIS — G4733 Obstructive sleep apnea (adult) (pediatric): Secondary | ICD-10-CM | POA: Diagnosis not present

## 2018-02-27 DIAGNOSIS — Z792 Long term (current) use of antibiotics: Secondary | ICD-10-CM | POA: Diagnosis not present

## 2018-02-27 DIAGNOSIS — D509 Iron deficiency anemia, unspecified: Secondary | ICD-10-CM | POA: Diagnosis not present

## 2018-02-27 DIAGNOSIS — I10 Essential (primary) hypertension: Secondary | ICD-10-CM | POA: Diagnosis not present

## 2018-02-27 DIAGNOSIS — H9190 Unspecified hearing loss, unspecified ear: Secondary | ICD-10-CM | POA: Diagnosis not present

## 2018-02-27 DIAGNOSIS — E785 Hyperlipidemia, unspecified: Secondary | ICD-10-CM | POA: Diagnosis not present

## 2018-02-27 DIAGNOSIS — M5136 Other intervertebral disc degeneration, lumbar region: Secondary | ICD-10-CM | POA: Diagnosis not present

## 2018-02-27 DIAGNOSIS — M1711 Unilateral primary osteoarthritis, right knee: Secondary | ICD-10-CM | POA: Diagnosis not present

## 2018-02-27 DIAGNOSIS — M1712 Unilateral primary osteoarthritis, left knee: Secondary | ICD-10-CM | POA: Diagnosis not present

## 2018-02-27 DIAGNOSIS — K559 Vascular disorder of intestine, unspecified: Secondary | ICD-10-CM | POA: Diagnosis not present

## 2018-03-01 ENCOUNTER — Ambulatory Visit
Admission: RE | Admit: 2018-03-01 | Discharge: 2018-03-01 | Disposition: A | Discharge status: Home / Self Care | Payer: Medicare Other | Source: Ambulatory Visit | Attending: Gastroenterology | Admitting: Gastroenterology

## 2018-03-01 DIAGNOSIS — I10 Essential (primary) hypertension: Secondary | ICD-10-CM | POA: Diagnosis not present

## 2018-03-01 DIAGNOSIS — Z7982 Long term (current) use of aspirin: Secondary | ICD-10-CM | POA: Diagnosis not present

## 2018-03-01 DIAGNOSIS — E785 Hyperlipidemia, unspecified: Secondary | ICD-10-CM | POA: Diagnosis not present

## 2018-03-01 DIAGNOSIS — H9190 Unspecified hearing loss, unspecified ear: Secondary | ICD-10-CM | POA: Diagnosis not present

## 2018-03-01 DIAGNOSIS — M5136 Other intervertebral disc degeneration, lumbar region: Secondary | ICD-10-CM | POA: Diagnosis not present

## 2018-03-01 DIAGNOSIS — K6389 Other specified diseases of intestine: Secondary | ICD-10-CM | POA: Insufficient documentation

## 2018-03-01 DIAGNOSIS — Z79891 Long term (current) use of opiate analgesic: Secondary | ICD-10-CM | POA: Diagnosis not present

## 2018-03-01 DIAGNOSIS — K559 Vascular disorder of intestine, unspecified: Secondary | ICD-10-CM | POA: Diagnosis not present

## 2018-03-01 DIAGNOSIS — M1711 Unilateral primary osteoarthritis, right knee: Secondary | ICD-10-CM | POA: Diagnosis not present

## 2018-03-01 DIAGNOSIS — M1712 Unilateral primary osteoarthritis, left knee: Secondary | ICD-10-CM | POA: Diagnosis not present

## 2018-03-01 DIAGNOSIS — K5730 Diverticulosis of large intestine without perforation or abscess without bleeding: Secondary | ICD-10-CM | POA: Diagnosis not present

## 2018-03-01 DIAGNOSIS — Z9181 History of falling: Secondary | ICD-10-CM | POA: Diagnosis not present

## 2018-03-01 DIAGNOSIS — M4186 Other forms of scoliosis, lumbar region: Secondary | ICD-10-CM | POA: Diagnosis not present

## 2018-03-01 DIAGNOSIS — G4733 Obstructive sleep apnea (adult) (pediatric): Secondary | ICD-10-CM | POA: Diagnosis not present

## 2018-03-01 DIAGNOSIS — D509 Iron deficiency anemia, unspecified: Secondary | ICD-10-CM | POA: Diagnosis not present

## 2018-03-01 DIAGNOSIS — Z792 Long term (current) use of antibiotics: Secondary | ICD-10-CM | POA: Diagnosis not present

## 2018-03-01 MED ORDER — IOPAMIDOL (ISOVUE-300) INJECTION 61%
100.0000 mL | Freq: Once | INTRAVENOUS | Status: AC | PRN
  Administered 2018-03-01: 100 mL via INTRAVENOUS

## 2018-03-06 ENCOUNTER — Other Ambulatory Visit: Payer: Self-pay

## 2018-03-06 ENCOUNTER — Encounter: Payer: Self-pay | Admitting: Student in an Organized Health Care Education/Training Program

## 2018-03-06 ENCOUNTER — Ambulatory Visit
Payer: Medicare Other | Attending: Student in an Organized Health Care Education/Training Program | Admitting: Student in an Organized Health Care Education/Training Program

## 2018-03-06 VITALS — BP 137/63 | HR 80 | Temp 98.1°F | Resp 18 | Ht 65.0 in | Wt 157.0 lb

## 2018-03-06 DIAGNOSIS — M25562 Pain in left knee: Secondary | ICD-10-CM | POA: Insufficient documentation

## 2018-03-06 DIAGNOSIS — G894 Chronic pain syndrome: Secondary | ICD-10-CM | POA: Diagnosis not present

## 2018-03-06 DIAGNOSIS — G8929 Other chronic pain: Secondary | ICD-10-CM | POA: Insufficient documentation

## 2018-03-06 DIAGNOSIS — M533 Sacrococcygeal disorders, not elsewhere classified: Secondary | ICD-10-CM | POA: Insufficient documentation

## 2018-03-06 DIAGNOSIS — M47816 Spondylosis without myelopathy or radiculopathy, lumbar region: Secondary | ICD-10-CM | POA: Diagnosis not present

## 2018-03-06 DIAGNOSIS — M5136 Other intervertebral disc degeneration, lumbar region: Secondary | ICD-10-CM | POA: Diagnosis not present

## 2018-03-06 DIAGNOSIS — M545 Low back pain: Secondary | ICD-10-CM

## 2018-03-06 DIAGNOSIS — M25561 Pain in right knee: Secondary | ICD-10-CM | POA: Insufficient documentation

## 2018-03-06 NOTE — Progress Notes (Signed)
Patient's Name: Jocelyn [name]  MRN: [id number]  Referring Provider: Arnetha Courser, MD  DOB: [birth year]  PCP: Arnetha Courser, MD  DOS: 03/06/2018  Note by: Gillis Santa, MD  Service setting: Ambulatory outpatient  Specialty: Interventional Pain Management  Location: [facility] (AMB) Pain Management Facility  Visit type: Initial Patient Evaluation  Patient type: New Patient   Primary Reason(s) for Visit: Encounter for initial evaluation of one or more chronic problems (new to examiner) potentially causing chronic pain, and posing a threat to normal musculoskeletal function. (Level of risk: High) CC: Back Pain (low)  HPI  Jocelyn [name] is a 85 y.o. year old, female patient, who comes today to see [place] for the first time for an initial evaluation of her chronic pain. She has Angina pectoris ([place]); Essential hypertension, benign; Arthritis sicca; DDD (degenerative disc disease), lumbar; Abnormal electrocardiogram; HLD (hyperlipidemia); Hearing loss; Decreased cardiac output; Facial nerve palsy; Cerebrovascular accident (CVA) ([place]); Controlled substance agreement signed; Medication monitoring encounter; OSA on CPAP; Arthritis of both knees; Hx of iron deficiency anemia; Carpal tunnel syndrome on right; Gastroesophageal reflux disease without esophagitis; Fall; Screening for osteoporosis; Lumbar scoliosis; Impaired fasting glucose; and Right sided colitis without complications on their problem list. Today she comes in for evaluation of her Back Pain (low)  Pain Assessment: Location: Lower Back Radiating: denies Onset: More than a month ago Duration: Chronic pain Quality: Stabbing Severity: 5 /10 (subjective, self-reported pain score)  Note: Reported level is inconsistent with clinical observations. Clinically the patient looks like a 3/10 A 3/10 is viewed as "Moderate" and described as significantly interfering with activities of daily living (ADL). It becomes difficult to feed, bathe, get dressed, get on and  off the toilet or to perform personal hygiene functions. Difficult to get in and out of bed or a chair without assistance. Very distracting. With effort, it can be ignored when deeply involved in activities. Information on the proper use of the pain scale provided to the patient today. When using our objective Pain Scale, levels between 6 and 10/10 are said to belong in an emergency room, as it progressively worsens from a 6/10, described as severely limiting, requiring emergency care not usually available at an outpatient pain management facility. At a 6/10 level, communication becomes difficult and requires great effort. Assistance to reach the emergency department may be required. Facial flushing and profuse sweating along with potentially dangerous increases in heart rate and blood pressure will be evident. Effect on ADL: Limits activities Timing: Intermittent Modifying factors: rest BP: 137/63  HR: 80  Onset and Duration: Gradual and Present longer than 3 months Cause of pain: Work related accident or event Severity: NAS-11 now: 9/10 Timing: did not complete Aggravating Factors: Bending, Climbing, Kneeling, Lifiting, Motion, Twisting and Walking Alleviating Factors: Medications, Resting, Sitting and Standing Associated Problems: Weakness Quality of Pain: Nagging, Sharp and Throbbing Previous Examinations or Tests: CT scan Previous Treatments: The patient denies treatments  The patient comes into the clinics today for the first time for a chronic pain management evaluation.  85 year old female with difficulty hearing who presents with a chief complaint of axial low back pain that is nonradiating.  She also endorses bilateral knee pain.  Patient states that her low back pain has been present for over 40 years.  She also has persistent knee pain that is worse in the medial compartment of her knees.  Patient also endorses moderate shoulder pain.  She comes to clinic today in a wheelchair with  her daughter.  Patient has tried injections in the past over 10 years ago which she cannot recall.  The patient is also had intra-articular knee steroid injections which she finds helpful.  She states that her previous ones were over 5 months ago.  Patient also finds benefit with hydrocodone 5 mg daily PRN.  Patient is also on gabapentin 100 mg daily.  Today I took the time to provide the patient with information regarding my pain practice. The patient was informed that my practice is divided into two sections: an interventional pain management section, as well as a completely separate and distinct medication management section. I explained that I have procedure days for my interventional therapies, and evaluation days for follow-ups and medication management. Because of the amount of documentation required during both, they are kept separated. This means that there is the possibility that she may be scheduled for a procedure on one day, and medication management the next. I have also informed her that because of staffing and facility limitations, I no longer take patients for medication management only. To illustrate the reasons for this, I gave the patient the example of surgeons, and how inappropriate it would be to refer a patient to his/her care, just to write for the post-surgical antibiotics on a surgery done by a different surgeon.   Because interventional pain management is my board-certified specialty, the patient was informed that joining my practice means that they are open to any and all interventional therapies. I made it clear that this does not mean that they will be forced to have any procedures done. What this means is that I believe interventional therapies to be essential part of the diagnosis and proper management of chronic pain conditions. Therefore, patients not interested in these interventional alternatives will be better served under the care of a different practitioner.  The patient  was also made aware of my Comprehensive Pain Management Safety Guidelines where by joining my practice, they limit all of their nerve blocks and joint injections to those done by our practice, for as long as we are retained to manage their care.   Historic Controlled Substance Pharmacotherapy Review  PMP and historical list of controlled substances: Hydrocodone 5 mg daily PRN, quantity 30, last fill 02/05/2018 MME/day: 15 mg/day Medications: The patient did not bring the medication(s) to the appointment, as requested in our "New Patient Package" Pharmacodynamics: Desired effects: Analgesia: The patient reports >50% benefit. Reported improvement in function: The patient reports medication allows her to accomplish basic ADLs. Clinically meaningful improvement in function (CMIF): Sustained CMIF goals met Perceived effectiveness: Described as relatively effective, allowing for increase in activities of daily living (ADL) Undesirable effects: Side-effects or Adverse reactions: None reported Historical Monitoring: The patient  reports no history of drug use. List of all UDS Test(s): No results found for: MDMA, COCAINSCRNUR, [place], [place], CANNABQUANT, [place], [place] List of other Serum/Urine Drug Screening Test(s):  No results found for: AMPHSCRSER, BARBSCRSER, BENZOSCRSER, COCAINSCRSER, COCAINSCRNUR, PCPSCRSER, PCPQUANT, THCSCRSER, THCU, CANNABQUANT, OPIATESCRSER, OXYSCRSER, PROPOXSCRSER, ETH Historical Background Evaluation: [place] PMP: Six (6) year initial data search conducted.             [place] Department of public safety, offender search: Editor, commissioning Information) Non-contributory Risk Assessment Profile: Aberrant behavior: None observed or detected today Risk factors for fatal opioid overdose: None identified today Fatal overdose hazard ratio (HR): Calculation deferred Non-fatal overdose hazard ratio (HR): Calculation deferred Risk of opioid abuse or dependence: 0.7-3.0% with doses ? 36 MME/day and  6.1-26% with doses ?  120 MME/day. Substance use disorder (SUD) risk level: See below Personal History of Substance Abuse (SUD-Substance use disorder):  Alcohol:    Illegal Drugs:    Rx Drugs:    ORT Risk Level calculation:    ORT Scoring interpretation table:  Score <3 = Low Risk for SUD  Score between 4-7 = Moderate Risk for SUD  Score >8 = High Risk for Opioid Abuse   PHQ-2 Depression Scale:  Total score: 0  PHQ-2 Scoring interpretation table: (Score and probability of major depressive disorder)  Score 0 = No depression  Score 1 = 15.4% Probability  Score 2 = 21.1% Probability  Score 3 = 38.4% Probability  Score 4 = 45.5% Probability  Score 5 = 56.4% Probability  Score 6 = 78.6% Probability   PHQ-9 Depression Scale:  Total score: 0  PHQ-9 Scoring interpretation table:  Score 0-4 = No depression  Score 5-9 = Mild depression  Score 10-14 = Moderate depression  Score 15-19 = Moderately severe depression  Score 20-27 = Severe depression (2.4 times higher risk of SUD and 2.89 times higher risk of overuse)   Pharmacologic Plan: As per protocol, I have not taken over any controlled substance management, pending the results of ordered tests and/or consults.            Initial impression: Pending review of available data and ordered tests.  Meds   Current Outpatient Medications:  .  albuterol (PROVENTIL HFA;VENTOLIN HFA) 108 (90 Base) MCG/ACT inhaler, Inhale into the lungs., Disp: , Rfl:  .  aspirin EC 81 MG tablet, Take 1 tablet (81 mg total) by mouth daily., Disp: 30 tablet, Rfl: 12 .  calcium-vitamin D 250-100 MG-UNIT tablet, Take 1 tablet by mouth 2 (two) times daily., Disp: , Rfl:  .  cloNIDine (CATAPRES) 0.1 MG tablet, TAKE 1 TABLET (0.1 MG TOTAL) BY MOUTH EVERY 8 (EIGHT) HOURS., Disp: 270 tablet, Rfl: 3 .  diclofenac sodium (VOLTAREN) 1 % GEL, Apply 4 g topically 4 (four) times daily as needed. On the knees, Disp: 100 g, Rfl: 5 .  famotidine (PEPCID) 20 MG tablet, Take 1  tablet (20 mg total) by mouth 2 (two) times daily. If needed for heartburn; do not take ranitidine, Disp: 60 tablet, Rfl: 2 .  ferrous sulfate 325 (65 FE) MG tablet, Take 325 mg by mouth daily., Disp: , Rfl:  .  gabapentin (NEURONTIN) 100 MG capsule, Take 100 mg by mouth daily., Disp: , Rfl: 3 .  HYDROcodone-acetaminophen (NORCO/VICODIN) 5-325 MG tablet, Take 1 tablet by mouth every 8 (eight) hours as needed for moderate pain., Disp: 30 tablet, Rfl: 0 .  isosorbide mononitrate (IMDUR) 120 MG 24 hr tablet, TAKE 1 TABLET BY MOUTH EVERY DAY, Disp: 90 tablet, Rfl: 3 .  Multiple Vitamins-Minerals (CENTRUM SILVER 50+WOMEN) TABS, Take 1 tablet by mouth daily., Disp: , Rfl:  .  Olmesartan-amLODIPine-HCTZ 40-10-12.5 MG TABS, Take 1 tablet by mouth daily. For blood pressure, Disp: 90 tablet, Rfl: 3 .  potassium chloride (KLOR-CON 10) 10 MEQ tablet, Take 1 tablet (10 mEq total) by mouth 3 (three) times a week., Disp: 12 tablet, Rfl: 0 .  pravastatin (PRAVACHOL) 20 MG tablet, Take one pill by mouth two nights each week (Wednesdays and Sundays, for example), Disp: 26 tablet, Rfl: 1  Imaging Review   Results for orders placed during the hospital encounter of 08/19/14  DG Lumbar Spine Complete   Narrative CLINICAL DATA:  Low back pain.  No recent injury.  EXAM: LUMBAR SPINE -  COMPLETE 4+ VIEW  COMPARISON:  None.  FINDINGS: There is marked levoscoliosis of the lumbar spine centered at the L2-3 level. There are extensive degenerative changes throughout the lumbar spine with associated disc space narrowings and osteophyte formation.  No acute-appearing cortical irregularity or osseous lesions seen. No fracture line or displaced fracture fragment. Atherosclerotic changes are seen along the walls of the grossly normal-caliber infrarenal abdominal aorta. Paravertebral soft tissues otherwise unremarkable.  IMPRESSION: Marked scoliosis.  Extensive degenerative changes throughout the lumbar spine,  most prominent at the mid and lower levels.  No acute findings.   Electronically Signed   By: Franki Cabot M.D.   On: 08/19/2014 14:03      Wrist Imaging: Wrist-R DG Complete:  Results for orders placed in visit on 10/08/14  DG Wrist Complete Right   Narrative CLINICAL DATA:  2-3 month history of right hand and wrist pain without known injury  EXAM: RIGHT HAND - COMPLETE 3+ VIEW; RIGHT WRIST - COMPLETE 3+ VIEW  COMPARISON:  None in PACs  FINDINGS: Right hand: The bones are osteopenic. There is minimal narrowing of the DIP joint of the index and fourth digits. There is mild narrowing of the IP joint of the thumb. There is narrowing of the third metacarpophalangeal joint. There is no acute fracture nor dislocation. There is no lytic or blastic lesion or periosteal reaction. There is a soft tissue calcification in the palmar region of the hand ventral to the midshaft of the third metacarpal.  Right wrist: The bones are mildly osteopenic. There is calcification of the triangular fibrocartilage as well as within the radiocarpal joint space. The distal radius and ulna are intact. The carpal bones are intact. There are mild degenerative changes of the articulation of the scaphoid with the trapezium and trapezoid as well as with the base of the first metacarpal. The soft tissues exhibit no acute abnormalities.  IMPRESSION: There are mild degenerative changes of the joints of the wrist and hand as described. There is no acute fracture nor dislocation. There is no periosteal reaction.   Electronically Signed   By: David  Martinique M.D.   On: 10/08/2014 10:36     Hand Imaging: Hand-R DG Complete:  Results for orders placed in visit on 10/08/14  DG Hand Complete Right   Narrative CLINICAL DATA:  2-3 month history of right hand and wrist pain without known injury  EXAM: RIGHT HAND - COMPLETE 3+ VIEW; RIGHT WRIST - COMPLETE 3+ VIEW  COMPARISON:  None in  PACs  FINDINGS: Right hand: The bones are osteopenic. There is minimal narrowing of the DIP joint of the index and fourth digits. There is mild narrowing of the IP joint of the thumb. There is narrowing of the third metacarpophalangeal joint. There is no acute fracture nor dislocation. There is no lytic or blastic lesion or periosteal reaction. There is a soft tissue calcification in the palmar region of the hand ventral to the midshaft of the third metacarpal.  Right wrist: The bones are mildly osteopenic. There is calcification of the triangular fibrocartilage as well as within the radiocarpal joint space. The distal radius and ulna are intact. The carpal bones are intact. There are mild degenerative changes of the articulation of the scaphoid with the trapezium and trapezoid as well as with the base of the first metacarpal. The soft tissues exhibit no acute abnormalities.  IMPRESSION: There are mild degenerative changes of the joints of the wrist and hand as described. There is no acute fracture  nor dislocation. There is no periosteal reaction.   Electronically Signed   By: David  Martinique M.D.   On: 10/08/2014 10:36    Hand-L DG Complete: No results found for this or any previous visit.  Complexity Note: Imaging results reviewed. Results shared with Ms. Toy Cookey, using [employer].                         ROS  Cardiovascular: Daily Aspirin intake and High blood pressure Pulmonary or Respiratory: Snoring  and Temporary stoppage of breathing during sleep Neurological: No reported neurological signs or symptoms such as seizures, abnormal skin sensations, urinary and/or fecal incontinence, being born with an abnormal open spine and/or a tethered spinal cord Review of Past Neurological Studies: No results found for this or any previous visit. Psychological-Psychiatric: No reported psychological or psychiatric signs or symptoms such as difficulty sleeping, anxiety, depression,  delusions or hallucinations (schizophrenial), mood swings (bipolar disorders) or suicidal ideations or attempts Gastrointestinal: No reported gastrointestinal signs or symptoms such as vomiting or evacuating blood, reflux, heartburn, alternating episodes of diarrhea and constipation, inflamed or scarred liver, or pancreas or irrregular and/or infrequent bowel movements Genitourinary: No reported renal or genitourinary signs or symptoms such as difficulty voiding or producing urine, peeing blood, non-functioning kidney, kidney stones, difficulty emptying the bladder, difficulty controlling the flow of urine, or chronic kidney disease Hematological: No reported hematological signs or symptoms such as prolonged bleeding, low or poor functioning platelets, bruising or bleeding easily, hereditary bleeding problems, low energy levels due to low hemoglobin or being anemic Endocrine: No reported endocrine signs or symptoms such as high or low blood sugar, rapid heart rate due to high thyroid levels, obesity or weight gain due to slow thyroid or thyroid disease Rheumatologic: No reported rheumatological signs and symptoms such as fatigue, joint pain, tenderness, swelling, redness, heat, stiffness, decreased range of motion, with or without associated rash Musculoskeletal: Negative for myasthenia gravis, muscular dystrophy, multiple sclerosis or malignant hyperthermia Work History: Retired  Allergies  Ms. Mcclenahan has No Known Allergies.  Laboratory Chemistry  Inflammation Markers (CRP: Acute Phase) (ESR: Chronic Phase) No results found for: CRP, ESRSEDRATE, LATICACIDVEN                       Rheumatology Markers No results found for: RF, ANA, LABURIC, URICUR, LYMEIGGIGMAB, LYMEABIGMQN, HLAB27                      Renal Function Markers Lab Results  Component Value Date   BUN 16 02/07/2018   CREATININE 0.68 24/10/7351   BCR NOT APPLICABLE 29/92/4268   GFRAA 93 02/07/2018   GFRNONAA 80 02/07/2018                              Hepatic Function Markers Lab Results  Component Value Date   AST 14 02/07/2018   ALT 5 (L) 02/07/2018   ALBUMIN 4.1 07/07/2016   ALKPHOS 49 07/07/2016                        Electrolytes Lab Results  Component Value Date   NA 141 02/07/2018   K 3.4 (L) 02/07/2018   CL 103 02/07/2018   CALCIUM 9.5 02/07/2018                        Neuropathy Markers  Lab Results  Component Value Date   HGBA1C 6.1 (H) 12/08/2017                        CNS Tests No results found for: COLORCSF, APPEARCSF, RBCCOUNTCSF, WBCCSF, POLYSCSF, LYMPHSCSF, EOSCSF, PROTEINCSF, GLUCCSF, JCVIRUS, CSFOLI, IGGCSF                      Bone Pathology Markers No results found for: VD25OH, AN191YO0AYO, KH9977SF4, EL9532YE3, 25OHVITD1, 25OHVITD2, 25OHVITD3, TESTOFREE, TESTOSTERONE                       Coagulation Parameters Lab Results  Component Value Date   PLT 211 02/07/2018                        Cardiovascular Markers Lab Results  Component Value Date   HGB 10.1 (L) 02/07/2018   HCT 31.7 (L) 02/07/2018                         CA Markers No results found for: CEA, CA125, LABCA2                      Endocrine Markers Lab Results  Component Value Date   TSH 0.65 02/07/2018   FREET4 1.2 02/07/2018                        Note: Lab results reviewed.  [place]  Drug: Ms. Runk  reports no history of drug use. Alcohol:  reports no history of alcohol use. Tobacco:  reports that she has never smoked. She has never used smokeless tobacco. Medical:  has a past medical history of Arthritis of both knees (08/01/2015), Colitis, Essential hypertension, benign (07/28/2014), GERD (gastroesophageal reflux disease), iron deficiency anemia (09/02/2015), Hyperlipidemia, Hypertension, Lumbar pain, Medicare annual wellness visit, subsequent (04/07/2017), OSA on CPAP (08/01/2015), Overweight (BMI 25.0-29.9) (07/10/2013), Prediabetes (09/02/2015), and SOB (shortness of breath) (07/10/2013). Family: family  history includes Anuerysm in her son; Cancer in her daughter and father; Diabetes in her daughter and mother; Heart disease in her mother; Hypertension in her daughter.  Past Surgical History:  Procedure Laterality Date  . ABDOMINAL HYSTERECTOMY    . CATARACT EXTRACTION    . CHOLECYSTECTOMY    . LUMBAR DISC SURGERY     Active Ambulatory Problems    Diagnosis Date Noted  . Angina pectoris ([place]) 07/10/2013  . Essential hypertension, benign 07/28/2014  . Arthritis sicca 05/23/2006  . DDD (degenerative disc disease), lumbar 07/28/2014  . Abnormal electrocardiogram 07/28/2014  . HLD (hyperlipidemia) 07/10/2013  . Hearing loss 01/29/2009  . Decreased cardiac output 07/28/2014  . 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 Cerebrovascular accident (CVA) ([place]) 07/10/2013  . Controlled substance agreement signed 07/08/2015  . Medication monitoring encounter 07/08/2015  . OSA on CPAP 08/01/2015  . Arthritis of both knees 08/01/2015  . Hx of iron deficiency anemia 09/02/2015  . Carpal tunnel syndrome on right 09/05/2015  . Gastroesophageal reflux disease without esophagitis 09/05/2015  . Fall 12/10/2015  . Screening for osteoporosis 02/29/2016  . Lumbar scoliosis 11/03/2016  . Impaired fasting glucose 02/06/2017  . Right sided colitis without complications 34/35/6861   Resolved Ambulatory Problems    Diagnosis Date Noted  . Cerebral vascular accident ([place]) 07/10/2013  . Edema extremities 07/28/2014  . Obesity (BMI 30.0-34.9) 07/10/2013  . Prediabetes 09/02/2015  .  Weight loss, unintentional 12/10/2015  . Preventative health care 02/29/2016  . Hypertension 07/10/2013  . SOB (shortness of breath) 07/10/2013  . Medicare annual wellness visit, subsequent 04/07/2017   Past Medical History:  Diagnosis Date  . Colitis   . GERD (gastroesophageal reflux disease)   . Hyperlipidemia   . Lumbar pain   . Overweight (BMI 25.0-29.9) 07/10/2013   Constitutional Exam  General appearance: Well  nourished, well developed, and well hydrated. In no apparent acute distress Vitals:   03/06/18 1124  BP: 137/63  Pulse: 80  Resp: 18  Temp: 98.1 F (36.7 C)  SpO2: 98%  Weight: 157 lb (71.2 kg)  Height: _0  (1.651 m)   BMI Assessment: Estimated body mass index is 26.13 kg/m as calculated from the following:   Height as of this encounter: _1  (1.651 m).   Weight as of this encounter: 157 lb (71.2 kg).  BMI interpretation table: BMI level Category Range association with higher incidence of chronic pain  <18 kg/m2 Underweight   18.5-24.9 kg/m2 Ideal body weight   25-29.9 kg/m2 Overweight Increased incidence by 20%  30-34.9 kg/m2 Obese (Class I) Increased incidence by 68%  35-39.9 kg/m2 Severe obesity (Class II) Increased incidence by 136%  >40 kg/m2 Extreme obesity (Class III) Increased incidence by 254%   Patient's current BMI Ideal Body weight  Body mass index is 26.13 kg/m. Ideal body weight: 57 kg (125 lb 10.6 oz) Adjusted ideal body weight: 62.7 kg (138 lb 3.2 oz)   BMI Readings from Last 4 Encounters:  03/06/18 26.13 kg/m  02/23/18 28.17 kg/m  02/22/18 28.17 kg/m  02/07/18 27.09 kg/m   Wt Readings from Last 4 Encounters:  03/06/18 157 lb (71.2 kg)  02/23/18 159 lb (72.1 kg)  02/22/18 159 lb (72.1 kg)  02/07/18 152 lb 14.4 oz (69.4 kg)  Psych/Mental status: Alert and oriented x3       Eyes: PERLA Respiratory: No evidence of acute respiratory distress  Cervical Spine Area Exam  Skin & Axial Inspection: No masses, redness, edema, swelling, or associated skin lesions Alignment: Symmetrical Functional ROM: Unrestricted ROM      Stability: No instability detected Muscle Tone/Strength: Functionally intact. No obvious neuro-muscular anomalies detected. Sensory (Neurological): Unimpaired Palpation: No palpable anomalies              Upper Extremity (UE) Exam    Side: Right upper extremity  Side: Left upper extremity  Skin & Extremity Inspection: Skin color,  temperature, and hair growth are WNL. No peripheral edema or cyanosis. No masses, redness, swelling, asymmetry, or associated skin lesions. No contractures.  Skin & Extremity Inspection: Skin color, temperature, and hair growth are WNL. No peripheral edema or cyanosis. No masses, redness, swelling, asymmetry, or associated skin lesions. No contractures.  Functional ROM: Unrestricted ROM          Functional ROM: Unrestricted ROM          Muscle Tone/Strength: Functionally intact. No obvious neuro-muscular anomalies detected.  Muscle Tone/Strength: Functionally intact. No obvious neuro-muscular anomalies detected.  Sensory (Neurological): Unimpaired          Sensory (Neurological): Unimpaired          Palpation: No palpable anomalies              Palpation: No palpable anomalies              Provocative Test(s):  Phalen's test: deferred Tinel's test: deferred Apley's scratch test (touch opposite shoulder):  Action 1 (Across chest):  deferred Action 2 (Overhead): deferred Action 3 (LB reach): deferred   Provocative Test(s):  Phalen's test: deferred Tinel's test: deferred Apley's scratch test (touch opposite shoulder):  Action 1 (Across chest): deferred Action 2 (Overhead): deferred Action 3 (LB reach): deferred    Thoracic Spine Area Exam  Skin & Axial Inspection: No masses, redness, or swelling Alignment: Symmetrical Functional ROM: Unrestricted ROM Stability: No instability detected Muscle Tone/Strength: Functionally intact. No obvious neuro-muscular anomalies detected. Sensory (Neurological): Unimpaired Muscle strength & Tone: No palpable anomalies  Lumbar Spine Area Exam  Skin & Axial Inspection: No masses, redness, or swelling Alignment: Scoliosis detected Functional ROM: Decreased ROM       Stability: No instability detected Muscle Tone/Strength: Functionally intact. No obvious neuro-muscular anomalies detected. Sensory (Neurological): Musculoskeletal pain pattern Palpation:  Complains of area being tender to palpation Bilateral Fist Percussion Test Provocative Tests: Hyperextension/rotation test: (+) bilaterally for facet joint pain. Lumbar quadrant test (Kemp's test): deferred today       Lateral bending test: (+) due to pain. Patrick's Maneuver: (+) for bilateral S-I arthralgia             FABER* test: deferred today                   S-I anterior distraction/compression test: deferred today         S-I lateral compression test: deferred today         S-I Thigh-thrust test: deferred today         S-I Gaenslen's test: deferred today         *(Flexion, ABduction and External Rotation)  Gait & Posture Assessment  Ambulation: Patient came in today in a wheel chair Gait: Limited. Using assistive device to ambulate Posture: Difficulty standing up straight, due to pain   Lower Extremity Exam    Side: Right lower extremity  Side: Left lower extremity  Stability: No instability observed          Stability: No instability observed          Skin & Extremity Inspection: Skin color, temperature, and hair growth are WNL. No peripheral edema or cyanosis. No masses, redness, swelling, asymmetry, or associated skin lesions. No contractures.  Skin & Extremity Inspection: Skin color, temperature, and hair growth are WNL. No peripheral edema or cyanosis. No masses, redness, swelling, asymmetry, or associated skin lesions. No contractures.  Functional ROM: Decreased ROM for hip and knee joints          Functional ROM: Decreased ROM for hip and knee joints          Muscle Tone/Strength: Functionally intact. No obvious neuro-muscular anomalies detected.  Muscle Tone/Strength: Functionally intact. No obvious neuro-muscular anomalies detected.  Sensory (Neurological): Arthropathic arthralgia        Sensory (Neurological): Arthropathic arthralgia        DTR: Patellar: 1+: trace Achilles: deferred today Plantar: deferred today  DTR: Patellar: 1+: trace Achilles: deferred  today Plantar: deferred today  Palpation: No palpable anomalies  Palpation: No palpable anomalies   Assessment  Primary Diagnosis & Pertinent Problem List: The primary encounter diagnosis was Chronic bilateral low back pain without sciatica. Diagnoses of Lumbar degenerative disc disease, Lumbar facet arthropathy, Chronic pain of both knees, Chronic SI joint pain, and Chronic pain syndrome were also pertinent to this visit.  Visit Diagnosis (New problems to examiner): 1. Chronic bilateral low back pain without sciatica   2. Lumbar degenerative disc disease   3. Lumbar facet arthropathy   4.  Chronic pain of both knees   5. Chronic SI joint pain   6. Chronic pain syndrome    Plan of Care (Initial workup plan)  Note: Please be advised that as per protocol, today's visit has been an evaluation only. We have not taken over the patient's controlled substance management.   UDS today.  We will also obtain imaging studies of the patient's lumbar spine, SI joints, bilateral knees.  Interventional options could include diagnostic lumbar facet medial branch nerve blocks, bilateral SI joint injection, bilateral knee intra-articular steroid injection, bilateral knee genicular nerve block pending imaging studies.  Also pending urine toxicology results, will consider taking patient on for low-dose hydrocodone 5 mg daily PRN, quantity 30/month.  Ordered Lab-work, Procedure(s), Referral(s), & Consult(s): Orders Placed This Encounter  Procedures  . DG Knee 1-2 Views Left  . DG Knee 1-2 Views Right  . DG Lumbar Spine Complete W/Bend  . DG Si Joints  . Compliance Drug Analysis, Ur   Pharmacological management options:  Opioid Analgesics: The patient was informed that there is no guarantee that she would be a candidate for opioid analgesics. The decision will be made following CDC guidelines. This decision will be based on the results of diagnostic studies, as well as Ms. Bienaime's risk profile.   Membrane  stabilizer: Continue gabapentin 100 mg daily.  Muscle relaxant: Avoid given risk of sedation  NSAID: To be determined at a later time  Other analgesic(s): To be determined at a later time   Interventional management options: Ms. Blough was informed that there is no guarantee that she would be a candidate for interventional therapies. The decision will be based on the results of diagnostic studies, as well as Ms. Dresch's risk profile.  Procedure(s) under consideration:  Diagnostic lumbar facet medial branch nerve blocks Bilateral sacroiliac joint injection Bilateral intra-articular knee steroid injection Bilateral genicular nerve block   Provider-requested follow-up: Return in about 2 weeks (around 03/20/2018) for After Imaging, Medication Management.  Future Appointments  Date Time Provider [place]  03/22/2018 11:30 AM Gillis Santa, MD [facility] None  04/09/2018  8:40 AM Arnetha Courser, MD [facility]  05/24/2018 11:40 AM Lada, Satira Anis, MD [facility]    Primary Care Physician: Arnetha Courser, MD Location: [facility] Note by: Gillis Santa, M.D, Date: 03/06/2018; Time: 2:12 PM  Patient Instructions  You have been instructed to get xrays today and you provided a urine specimen.

## 2018-03-06 NOTE — Progress Notes (Signed)
Safety precautions to be maintained throughout the outpatient stay will include: orient to surroundings, keep bed in low position, maintain call bell within reach at all times, provide assistance with transfer out of bed and ambulation.  

## 2018-03-06 NOTE — Patient Instructions (Signed)
You have been instructed to get xrays today and you provided a urine specimen.

## 2018-03-08 ENCOUNTER — Telehealth: Payer: Self-pay | Admitting: Family Medicine

## 2018-03-08 MED ORDER — HYDROCODONE-ACETAMINOPHEN 5-325 MG PO TABS: 1.0000 | ORAL_TABLET | Freq: Three times a day (TID) | ORAL | 0 refills | Status: DC | PRN

## 2018-03-08 NOTE — Telephone Encounter (Signed)
Patient has seen pain management specialist PMP Aware reviewed One more Rx written today Anticipate Dr. Holley Raring will manage after this Rx

## 2018-03-08 NOTE — Telephone Encounter (Signed)
Copied from [place] [phone]. Topic: Quick Communication - Rx Refill/Question >> Mar 08, 2018 12:10 PM Percell Belt A wrote: Medication: HYDROcodone-acetaminophen (NORCO/VICODIN) 5-325 MG tablet [[id number]]  Has the patient contacted their pharmacy? No - pt daughter called stated that pt went to pain mang Dr but is not able to get meds on 1st visit.  The pain Dr told her to call and get one more refill from Dr Sanda Klein , while that are awaiting the records (Agent: If no, request that the patient contact the pharmacy for the refill.) (Agent: If yes, when and what did the pharmacy advise?)  Preferred Pharmacy (with phone number or street name): [id number] [name], [place] [phone] (Phone)   Agent: Please be advised that RX refills may take up to 3 business days. We ask that you follow-up with your pharmacy.

## 2018-03-09 ENCOUNTER — Ambulatory Visit: Payer: Medicare Other | Admitting: Family Medicine

## 2018-03-10 LAB — COMPLIANCE DRUG ANALYSIS, UR

## 2018-03-12 ENCOUNTER — Ambulatory Visit
Admission: RE | Admit: 2018-03-12 | Discharge: 2018-03-12 | Disposition: A | Discharge status: Home / Self Care | Payer: Medicare Other | Attending: Student in an Organized Health Care Education/Training Program | Admitting: Student in an Organized Health Care Education/Training Program

## 2018-03-12 ENCOUNTER — Ambulatory Visit
Admission: RE | Admit: 2018-03-12 | Discharge: 2018-03-12 | Disposition: A | Discharge status: Home / Self Care | Payer: Medicare Other | Source: Ambulatory Visit | Attending: Student in an Organized Health Care Education/Training Program | Admitting: Student in an Organized Health Care Education/Training Program

## 2018-03-12 DIAGNOSIS — G8929 Other chronic pain: Secondary | ICD-10-CM | POA: Diagnosis not present

## 2018-03-12 DIAGNOSIS — M4186 Other forms of scoliosis, lumbar region: Secondary | ICD-10-CM | POA: Diagnosis not present

## 2018-03-12 DIAGNOSIS — M1711 Unilateral primary osteoarthritis, right knee: Secondary | ICD-10-CM | POA: Diagnosis not present

## 2018-03-12 DIAGNOSIS — M4807 Spinal stenosis, lumbosacral region: Secondary | ICD-10-CM | POA: Diagnosis not present

## 2018-03-12 DIAGNOSIS — M25562 Pain in left knee: Secondary | ICD-10-CM | POA: Diagnosis not present

## 2018-03-12 DIAGNOSIS — M25561 Pain in right knee: Principal | ICD-10-CM

## 2018-03-12 DIAGNOSIS — M1712 Unilateral primary osteoarthritis, left knee: Secondary | ICD-10-CM | POA: Diagnosis not present

## 2018-03-12 DIAGNOSIS — M160 Bilateral primary osteoarthritis of hip: Secondary | ICD-10-CM | POA: Diagnosis not present

## 2018-03-12 DIAGNOSIS — M5136 Other intervertebral disc degeneration, lumbar region: Secondary | ICD-10-CM

## 2018-03-13 ENCOUNTER — Ambulatory Visit: Payer: Medicare Other | Admitting: Gastroenterology

## 2018-03-14 ENCOUNTER — Other Ambulatory Visit: Payer: Self-pay

## 2018-03-14 DIAGNOSIS — K639 Disease of intestine, unspecified: Secondary | ICD-10-CM

## 2018-03-22 ENCOUNTER — Ambulatory Visit
Payer: Medicare Other | Attending: Student in an Organized Health Care Education/Training Program | Admitting: Student in an Organized Health Care Education/Training Program

## 2018-03-22 ENCOUNTER — Other Ambulatory Visit: Payer: Self-pay

## 2018-03-22 ENCOUNTER — Encounter: Payer: Self-pay | Admitting: Student in an Organized Health Care Education/Training Program

## 2018-03-22 VITALS — BP 129/67 | HR 68 | Temp 98.6°F | Resp 18 | Ht 63.0 in | Wt 157.0 lb

## 2018-03-22 DIAGNOSIS — M533 Sacrococcygeal disorders, not elsewhere classified: Secondary | ICD-10-CM

## 2018-03-22 DIAGNOSIS — M47816 Spondylosis without myelopathy or radiculopathy, lumbar region: Secondary | ICD-10-CM

## 2018-03-22 DIAGNOSIS — G894 Chronic pain syndrome: Secondary | ICD-10-CM

## 2018-03-22 DIAGNOSIS — M5136 Other intervertebral disc degeneration, lumbar region: Secondary | ICD-10-CM

## 2018-03-22 DIAGNOSIS — M25561 Pain in right knee: Secondary | ICD-10-CM

## 2018-03-22 DIAGNOSIS — M25562 Pain in left knee: Secondary | ICD-10-CM | POA: Diagnosis not present

## 2018-03-22 DIAGNOSIS — M47818 Spondylosis without myelopathy or radiculopathy, sacral and sacrococcygeal region: Secondary | ICD-10-CM

## 2018-03-22 DIAGNOSIS — G8929 Other chronic pain: Secondary | ICD-10-CM | POA: Insufficient documentation

## 2018-03-22 MED ORDER — HYDROCODONE-ACETAMINOPHEN 5-325 MG PO TABS: 1.0000 | ORAL_TABLET | Freq: Every day | ORAL | 0 refills | Status: DC | PRN

## 2018-03-22 NOTE — Patient Instructions (Addendum)
1. Contract 2. Return for lumbar facets blocks  One prescription for Hydrocodone has been sent to your pharmacy.  ____________________________________________________________________________________________  Preparing for Procedure with Sedation  Instructions: . Oral Intake: Do not eat or drink anything for at least 8 hours prior to your procedure. . Transportation: Public transportation is not allowed. Bring an adult driver. The driver must be physically present in our waiting room before any procedure can be started. Marland Kitchen Physical Assistance: Bring an adult physically capable of assisting you, in the event you need help. This adult should keep you company at home for at least 6 hours after the procedure. . Blood Pressure Medicine: Take your blood pressure medicine with a sip of water the morning of the procedure. . Blood thinners: Notify our staff if you are taking any blood thinners. Depending on which one you take, there will be specific instructions on how and when to stop it. . Diabetics on insulin: Notify the staff so that you can be scheduled 1st case in the morning. If your diabetes requires high dose insulin, take only  of your normal insulin dose the morning of the procedure and notify the staff that you have done so. . Preventing infections: Shower with an antibacterial soap the morning of your procedure. . Build-up your immune system: Take 1000 mg of Vitamin C with every meal (3 times a day) the day prior to your procedure. Marland Kitchen Antibiotics: Inform the staff if you have a condition or reason that requires you to take antibiotics before dental procedures. . Pregnancy: If you are pregnant, call and cancel the procedure. . Sickness: If you have a cold, fever, or any active infections, call and cancel the procedure. . Arrival: You must be in the facility at least 30 minutes prior to your scheduled procedure. . Children: Do not bring children with you. . Dress appropriately: Bring dark  clothing that you would not mind if they get stained. . Valuables: Do not bring any jewelry or valuables.  Procedure appointments are reserved for interventional treatments only. Marland Kitchen No Prescription Refills. . No medication changes will be discussed during procedure appointments. . No disability issues will be discussed.  Reasons to call and reschedule or cancel your procedure: (Following these recommendations will minimize the risk of a serious complication.) . Surgeries: Avoid having procedures within 2 weeks of any surgery. (Avoid for 2 weeks before or after any surgery). . Flu Shots: Avoid having procedures within 2 weeks of a flu shots or . (Avoid for 2 weeks before or after immunizations). . Barium: Avoid having a procedure within 7-10 days after having had a radiological study involving the use of radiological contrast. (Myelograms, Barium swallow or enema study). . Heart attacks: Avoid any elective procedures or surgeries for the initial 6 months after a "Myocardial Infarction" (Heart Attack). . Blood thinners: It is imperative that you stop these medications before procedures. Let us know if you if you take any blood thinner.  . Infection: Avoid procedures during or within two weeks of an infection (including chest colds or gastrointestinal problems). Symptoms associated with infections include: Localized redness, fever, chills, night sweats or profuse sweating, burning sensation when voiding, cough, congestion, stuffiness, runny nose, sore throat, diarrhea, nausea, vomiting, cold or Flu symptoms, recent or current infections. It is specially important if the infection is over the area that we intend to treat. Marland Kitchen Heart and lung problems: Symptoms that may suggest an active cardiopulmonary problem include: cough, chest pain, breathing difficulties or shortness of breath, dizziness,  ankle swelling, uncontrolled high or unusually low blood pressure, and/or palpitations. If you are experiencing any  of these symptoms, cancel your procedure and contact your primary care physician for an evaluation.  Remember:  Regular Business hours are:  Monday to Thursday 8:00 AM to 4:00 PM  Provider's Schedule: Milinda Pointer, MD:  Procedure days: Tuesday and Thursday 7:30 AM to 4:00 PM  Gillis Santa, MD:  Procedure days: Monday and Wednesday 7:30 AM to 4:00 PM ____________________________________________________________________________________________

## 2018-03-22 NOTE — Progress Notes (Signed)
Patient's Name: Jocelyn [name]  MRN: [id number]  Referring Provider: Arnetha Courser, MD  DOB: 27-Jan-1932  PCP: Arnetha Courser, MD  DOS: 03/22/2018  Note by: Gillis Santa, MD  Service setting: Ambulatory outpatient  Specialty: Interventional Pain Management  Location: [facility] (AMB) Pain Management Facility    Patient type: Established   Primary Reason(s) for Visit: Encounter for evaluation before starting new chronic pain management plan of care (Level of risk: moderate) CC: Back Pain (lower)  HPI  Jocelyn [name] is a 85 y.o. year old, female patient, who comes today for a follow-up evaluation to review the test results and decide on a treatment plan. She has Angina pectoris ([place]); Essential hypertension, benign; Arthritis sicca; Lumbar degenerative disc disease; Abnormal electrocardiogram; HLD (hyperlipidemia); Hearing loss; Decreased cardiac output; Facial nerve palsy; Cerebrovascular accident (CVA) ([place]); Controlled substance agreement signed; Medication monitoring encounter; OSA on CPAP; Arthritis of both knees; Hx of iron deficiency anemia; Carpal tunnel syndrome on right; Gastroesophageal reflux disease without esophagitis; Fall; Screening for osteoporosis; Lumbar scoliosis; Impaired fasting glucose; Right sided colitis without complications; Lumbar facet arthropathy; Chronic SI joint pain; SI joint arthritis; Chronic pain of both knees; and Chronic pain syndrome on their problem list. Her primarily concern today is the Back Pain (lower)  Pain Assessment: Location: Lower Back Radiating: denies Onset: More than a month ago Duration: Chronic pain Quality: Aching Severity: 5 /10 (subjective, self-reported pain score)  Note: Reported level is compatible with observation.                         When using our objective Pain Scale, levels between 6 and 10/10 are said to belong in an emergency room, as it progressively worsens from a 6/10, described as severely limiting, requiring emergency care not  usually available at an outpatient pain management facility. At a 6/10 level, communication becomes difficult and requires great effort. Assistance to reach the emergency department may be required. Facial flushing and profuse sweating along with potentially dangerous increases in heart rate and blood pressure will be evident. Effect on ADL: limits activities Timing: Intermittent Modifying factors: rest BP: 129/67  HR: 68  Jocelyn [name] comes in today for a follow-up visit after her initial evaluation on 03/06/2018. Today we went over the results of her tests. These were explained in "Layman's terms". During today's appointment we went over my diagnostic impression, as well as the proposed treatment plan.  In considering the treatment plan options, Jocelyn [name] was reminded that I no longer take patients for medication management only. I asked her to let me know if she had no intention of taking advantage of the interventional therapies, so that we could make arrangements to provide this space to someone interested. I also made it clear that undergoing interventional therapies for the purpose of getting pain medications is very inappropriate on the part of a patient, and it will not be tolerated in this practice. This type of behavior would suggest true addiction and therefore it requires referral to an addiction specialist.   Further details on both, my assessment(s), as well as the proposed treatment plan, please see below.  Controlled Substance Pharmacotherapy Assessment REMS (Risk Evaluation and Mitigation Strategy)   Pill Count: None expected due to no prior prescriptions written by our practice. Rise Patience, RN  03/22/2018 11:57 AM  Sign when Signing Visit Safety precautions to be maintained throughout the outpatient stay will include: orient to surroundings, keep bed in low position, maintain  call bell within reach at all times, provide assistance with transfer out of bed and ambulation.      Monitoring: [place] PMP: Online review of the past 26-monthperiod previously conducted. Not applicable at this point since we have not taken over the patient's medication management yet. List of other Serum/Urine Drug Screening Test(s):  No results found for: AMPHSCRSER, BARBSCRSER, BENZOSCRSER, COCAINSCRSER, COCAINSCRNUR, PCPSCRSER, THCSCRSER, THCU, CAN[place]BQUANT, [place] test(s[place] done:  Lab Results  Component Value Date   SUMMARY FINAL 03/06/2018   Last UDS on record: Summary  Date Value Ref Range Status  03/06/2018 FINAL  Final    Comment:    ==================================================================== TOXASSURE COMP DRUG ANALYSIS,UR ==================================================================== Test                             Result       Flag       Units Drug Absent but Declared for Prescription Verification   Hydrocodone                    Not Detected UNEXPECTED ng/mg creat   Gabapentin                     Not Detected UNEXPECTED   Acetaminophen                  Not Detected UNEXPECTED    Acetaminophen, as indicated in the declared medication list, is    not always detected even when used as directed.   Diclofenac                     Not Detected UNEXPECTED    Topical diclofenac, as indicated in the declared medication list,    is not always detected even when used as directed.   Salicylate                     Not Detected UNEXPECTED    Aspirin, as indicated in the declared medication list, is not    always detected even when used as directed.   Clonidine                      Not Detected UNEXPECTED ==================================================================== Test                      Result    Flag   Units      Ref Range   Creatinine              41               mg/dL      >=20 ==================================================================== Declared Medications:  The flagging and interpretation on  this report are based on the  following declared medications.  Unexpected results may arise from  inaccuracies in the declared medications.  **Note: The testing scope of this panel includes these medications:  Clonidine (Catapres)  Gabapentin (Neurontin)  Hydrocodone (Norco)  **Note: The testing scope of this panel does not include small to  moderate amounts of these reported medications:  Acetaminophen (Norco)  Aspirin (Aspirin 81)  Topical Diclofenac (Voltaren)  **Note: The testing scope of this panel does not include following  reported medications:  Albuterol  Amlodipine  Calcium (Calcium/Vitamin D)  Famotidine (Pepcid)  Hydrochlorothiazide (HCTZ)  Iron (Ferrous Sulfate)  Isosorbide (Imdur)  Multivitamin (Centrum Silver)  Olmesartan  Potassium (K-Dur)  Pravastatin (Pravachol)  Supplement (Centrum Silver)  Vitamin D (Calcium/Vitamin D) ==================================================================== For clinical consultation, please call [phone]. ====================================================================    UDS interpretation: No unexpected findings.          Medication Assessment Form: Patient introduced to form today Treatment compliance: Treatment may start today if patient agrees with proposed plan. Evaluation of compliance is not applicable at this point Risk Assessment Profile: Aberrant behavior: See initial evaluations. None observed or detected today Comorbid factors increasing risk of overdose: See initial evaluation. No additional risks detected today Opioid risk tool (ORT):  Opioid Risk  03/22/2018  Alcohol 0  Illegal Drugs 0  Rx Drugs 0  Alcohol 0  Illegal Drugs 0  Rx Drugs 0  Age between 16-45 years  0  History of Preadolescent Sexual Abuse 0  Psychological Disease 0  Depression 0  Opioid Risk Tool Scoring 0  Opioid Risk Interpretation Low Risk    ORT Scoring interpretation table:  Score <3 = Low Risk for SUD  Score between  4-7 = Moderate Risk for SUD  Score >8 = High Risk for Opioid Abuse   Risk of substance use disorder (SUD): Low  Risk Mitigation Strategies:  Patient opioid safety counseling: Completed today. Counseling provided to patient as per "Patient Counseling Document". Document signed by patient, attesting to counseling and understanding Patient-Prescriber Agreement (PPA): Obtained today.  Controlled substance notification to other providers: Written and sent today.  Pharmacologic Plan: Today we may be taking over the patient's pharmacological regimen. See below.             Laboratory Chemistry  Inflammation Markers (CRP: Acute Phase) (ESR: Chronic Phase) No results found for: CRP, ESRSEDRATE, LATICACIDVEN                       Rheumatology Markers No results found for: RF, ANA, LABURIC, URICUR, LYMEIGGIGMAB, LYMEABIGMQN, HLAB27                      Renal Function Markers Lab Results  Component Value Date   BUN 16 02/07/2018   CREATININE 0.68 93/79/0240   BCR NOT APPLICABLE 97/35/3299   GFRAA 93 02/07/2018   GFRNONAA 80 02/07/2018                             Hepatic Function Markers Lab Results  Component Value Date   AST 14 02/07/2018   ALT 5 (L) 02/07/2018   ALBUMIN 4.1 07/07/2016   ALKPHOS 49 07/07/2016                        Electrolytes Lab Results  Component Value Date   NA 141 02/07/2018   K 3.4 (L) 02/07/2018   CL 103 02/07/2018   CALCIUM 9.5 02/07/2018                        Neuropathy Markers Lab Results  Component Value Date   HGBA1C 6.1 (H) 12/08/2017                        CNS Tests No results found for: COLORCSF, APPEARCSF, RBCCOUNTCSF, WBCCSF, POLYSCSF, LYMPHSCSF, EOSCSF, PROTEINCSF, GLUCCSF, JCVIRUS, CSFOLI, IGGCSF                      Bone Pathology Markers No results found for: VD25OH,  [id number], QB3419FX9, [id number], 25OHVITD1, 25OHVITD2, 25OHVITD3, TESTOFREE, TESTOSTERONE                       Coagulation Parameters Lab Results  Component  Value Date   PLT 211 02/07/2018                        Cardiovascular Markers Lab Results  Component Value Date   HGB 10.1 (L) 02/07/2018   HCT 31.7 (L) 02/07/2018                         CA Markers No results found for: CEA, CA125, LABCA2                      Endocrine Markers Lab Results  Component Value Date   TSH 0.65 02/07/2018   FREET4 1.2 02/07/2018                        Note: Lab results reviewed.  Recent Diagnostic Imaging Review  Lumbar DG (Complete) 4+V:  Results for orders placed during the hospital encounter of 08/19/14  DG Lumbar Spine Complete   Narrative CLINICAL DATA:  Low back pain.  No recent injury.  EXAM: LUMBAR SPINE - COMPLETE 4+ VIEW  COMPARISON:  None.  FINDINGS: There is marked levoscoliosis of the lumbar spine centered at the L2-3 level. There are extensive degenerative changes throughout the lumbar spine with associated disc space narrowings and osteophyte formation.  No acute-appearing cortical irregularity or osseous lesions seen. No fracture line or displaced fracture fragment. Atherosclerotic changes are seen along the walls of the grossly normal-caliber infrarenal abdominal aorta. Paravertebral soft tissues otherwise unremarkable.  IMPRESSION: Marked scoliosis.  Extensive degenerative changes throughout the lumbar spine, most prominent at the mid and lower levels.  No acute findings.   Electronically Signed   By: Franki Cabot M.D.   On: 08/19/2014 14:03     Lumbar DG Bending views:  Results for orders placed during the hospital encounter of 03/12/18  DG Lumbar Spine Complete W/Bend   Narrative CLINICAL DATA:  85 year old female with bilateral knee and back pain for years. No known injury. Initial encounter.  EXAM: LUMBAR SPINE - COMPLETE WITH BENDING VIEWS  COMPARISON:  03/01/2018 CT abdomen and pelvis.  FINDINGS: Scoliosis lumbar spine convex left with superimposed significant degenerative changes. Disc  space narrowing most notable on the right at the L2-3 and L3-4 level and on the left at the L4-5 level. Broad-based disc space narrowing L5-S1. No compression fracture or pars defect. No abnormal motion between flexion and extension.  Vascular calcification. Retained contrast within colonic diverticula.  IMPRESSION: 1. Scoliosis lumbar spine convex left with superimposed significant degenerative changes. Disc space narrowing most notable on the right at the L2-3 and L3-4 level and on the left at the L4-5 level. Broad-based disc space narrowing L5-S1. 2. No abnormal motion between flexion and extension. 3.  Aortic Atherosclerosis (ICD10-I70.0).   Electronically Signed   By: Genia Del M.D.   On: 03/12/2018 15:56     Sacroiliac Joint Imaging: Sacroiliac Joint DG:  Results for orders placed during the hospital encounter of 03/12/18  DG Si Joints   Narrative CLINICAL DATA:  85 year old female with bilateral knee and back pain for years. No known injury. Initial encounter.  EXAM: BILATERAL SACROILIAC JOINTS - 3+ VIEW  COMPARISON:  03/01/2018 CT  FINDINGS: Mild bilateral  sacroiliac joint degenerative changes. No bony erosions.  Bilateral hip joint degenerative changes.  Degenerative changes lower lumbar spine as described on lumbar spine plain film report.  Vascular calcifications. Retained contrast within sigmoid diverticula.  IMPRESSION: 1. Mild bilateral sacroiliac joint degenerative changes. 2. Bilateral hip joint degenerative changes.   Electronically Signed   By: Genia Del M.D.   On: 03/12/2018 16:03      Knee-R DG 1-2 views:  Results for orders placed during the hospital encounter of 03/12/18  DG Knee 1-2 Views Right   Narrative CLINICAL DATA:  85 year old female with bilateral knee and back pain for years. No known injury. Initial encounter.  EXAM: RIGHT KNEE - 1-2 VIEW  COMPARISON:  None.  FINDINGS: Marked medial tibiofemoral joint and  patellofemoral joint degenerative changes. Moderate lateral tibiofemoral joint degenerative changes. Possible chondrocalcinosis. Suspect loose bodies. Small to moderate-size joint effusion. No fracture or dislocation. Vascular calcifications.  IMPRESSION: 1. Marked medial tibiofemoral joint and patellofemoral joint degenerative changes. Moderate lateral tibiofemoral joint degenerative changes. 2. Possible chondrocalcinosis. 3. Suspect loose bodies. 4. Small to moderate-size joint effusion.   Electronically Signed   By: Genia Del M.D.   On: 03/12/2018 16:01    Knee-L DG 1-2 views:  Results for orders placed during the hospital encounter of 03/12/18  DG Knee 1-2 Views Left   Addendum ADDENDUM REPORT: 03/12/2018 16:04  ADDENDUM: Possible chondrocalcinosis.  Suspect loose bodies.   Electronically Signed   By: Genia Del M.D.   On: 03/12/2018 16:04       Genia Del, MD 03/12/2018  4:06 PM         Narrative CLINICAL DATA:  85 year old female with bilateral knee and back pain for years. No known injury. Initial encounter.  EXAM: LEFT KNEE - 1-2 VIEW  COMPARISON:  None.  FINDINGS: Marked medial tibiofemoral joint space and patellofemoral joint degenerative changes. Moderate lateral tibiofemoral joint degenerative changes.  Small to moderate-size joint effusion.  No fracture or dislocation.  Vascular calcifications.  IMPRESSION: 1. Marked medial tibiofemoral joint space and patellofemoral joint degenerative changes. Moderate lateral tibiofemoral joint degenerative changes. 2. Small to moderate-size joint effusion.  Electronically Signed: By: Genia Del M.D. On: 03/12/2018 15:58    Wrist Imaging: Wrist-R DG Complete:  Results for orders placed in visit on 10/08/14  DG Wrist Complete Right   Narrative CLINICAL DATA:  2-3 month history of right hand and wrist pain without known injury  EXAM: RIGHT HAND - COMPLETE 3+ VIEW; RIGHT WRIST - COMPLETE 3+  VIEW  COMPARISON:  None in PACs  FINDINGS: Right hand: The bones are osteopenic. There is minimal narrowing of the DIP joint of the index and fourth digits. There is mild narrowing of the IP joint of the thumb. There is narrowing of the third metacarpophalangeal joint. There is no acute fracture nor dislocation. There is no lytic or blastic lesion or periosteal reaction. There is a soft tissue calcification in the palmar region of the hand ventral to the midshaft of the third metacarpal.  Right wrist: The bones are mildly osteopenic. There is calcification of the triangular fibrocartilage as well as within the radiocarpal joint space. The distal radius and ulna are intact. The carpal bones are intact. There are mild degenerative changes of the articulation of the scaphoid with the trapezium and trapezoid as well as with the base of the first metacarpal. The soft tissues exhibit no acute abnormalities.  IMPRESSION: There are mild degenerative changes of the joints of the wrist  and hand as described. There is no acute fracture nor dislocation. There is no periosteal reaction.   Electronically Signed   By: David  Martinique M.D.   On: 10/08/2014 10:36     Hand Imaging: Hand-R DG Complete:  Results for orders placed in visit on 10/08/14  DG Hand Complete Right   Narrative CLINICAL DATA:  2-3 month history of right hand and wrist pain without known injury  EXAM: RIGHT HAND - COMPLETE 3+ VIEW; RIGHT WRIST - COMPLETE 3+ VIEW  COMPARISON:  None in PACs  FINDINGS: Right hand: The bones are osteopenic. There is minimal narrowing of the DIP joint of the index and fourth digits. There is mild narrowing of the IP joint of the thumb. There is narrowing of the third metacarpophalangeal joint. There is no acute fracture nor dislocation. There is no lytic or blastic lesion or periosteal reaction. There is a soft tissue calcification in the palmar region of the hand ventral to the  midshaft of the third metacarpal.  Right wrist: The bones are mildly osteopenic. There is calcification of the triangular fibrocartilage as well as within the radiocarpal joint space. The distal radius and ulna are intact. The carpal bones are intact. There are mild degenerative changes of the articulation of the scaphoid with the trapezium and trapezoid as well as with the base of the first metacarpal. The soft tissues exhibit no acute abnormalities.  IMPRESSION: There are mild degenerative changes of the joints of the wrist and hand as described. There is no acute fracture nor dislocation. There is no periosteal reaction.   Electronically Signed   By: David  Martinique M.D.   On: 10/08/2014 10:36      Complexity Note: Imaging results reviewed. Results shared with Jocelyn [name], using [employer].                         Meds   Current Outpatient Medications:  .  aspirin EC 81 MG tablet, Take 1 tablet (81 mg total) by mouth daily., Disp: 30 tablet, Rfl: 12 .  calcium-vitamin D 250-100 MG-UNIT tablet, Take 1 tablet by mouth 2 (two) times daily., Disp: , Rfl:  .  cloNIDine (CATAPRES) 0.1 MG tablet, TAKE 1 TABLET (0.1 MG TOTAL) BY MOUTH EVERY 8 (EIGHT) HOURS., Disp: 270 tablet, Rfl: 3 .  diclofenac sodium (VOLTAREN) 1 % GEL, Apply 4 g topically 4 (four) times daily as needed. On the knees, Disp: 100 g, Rfl: 5 .  famotidine (PEPCID) 20 MG tablet, Take 1 tablet (20 mg total) by mouth 2 (two) times daily. If needed for heartburn; do not take ranitidine, Disp: 60 tablet, Rfl: 2 .  ferrous sulfate 325 (65 FE) MG tablet, Take 325 mg by mouth daily., Disp: , Rfl:  .  gabapentin (NEURONTIN) 100 MG capsule, Take 100 mg by mouth daily., Disp: , Rfl: 3 .  HYDROcodone-acetaminophen (NORCO/VICODIN) 5-325 MG tablet, Take 1 tablet by mouth daily as needed for up to 30 days for moderate pain., Disp: 30 tablet, Rfl: 0 .  isosorbide mononitrate (IMDUR) 120 MG 24 hr tablet, TAKE 1 TABLET BY MOUTH EVERY  DAY, Disp: 90 tablet, Rfl: 3 .  Multiple Vitamins-Minerals (CENTRUM SILVER 50+WOMEN) TABS, Take 1 tablet by mouth daily., Disp: , Rfl:  .  Olmesartan-amLODIPine-HCTZ 40-10-12.5 MG TABS, Take 1 tablet by mouth daily. For blood pressure, Disp: 90 tablet, Rfl: 3 .  potassium chloride (KLOR-CON 10) 10 MEQ tablet, Take 1 tablet (10 mEq total) by mouth  3 (three) times a week., Disp: 12 tablet, Rfl: 0 .  pravastatin (PRAVACHOL) 20 MG tablet, Take one pill by mouth two nights each week (Wednesdays and Sundays, for example), Disp: 26 tablet, Rfl: 1 .  albuterol (PROVENTIL HFA;VENTOLIN HFA) 108 (90 Base) MCG/ACT inhaler, Inhale into the lungs., Disp: , Rfl:   ROS  Constitutional: Denies any fever or chills Gastrointestinal: No reported hemesis, hematochezia, vomiting, or acute GI distress Musculoskeletal: Denies any acute onset joint swelling, redness, loss of ROM, or weakness Neurological: No reported episodes of acute onset apraxia, aphasia, dysarthria, agnosia, amnesia, paralysis, loss of coordination, or loss of consciousness  Allergies  Jocelyn [name] has No Known Allergies.  [place]  Drug: Jocelyn [name]  reports no history of drug use. Alcohol:  reports no history of alcohol use. Tobacco:  reports that she has never smoked. She has never used smokeless tobacco. Medical:  has a past medical history of Arthritis of both knees (08/01/2015), Colitis, Essential hypertension, benign (07/28/2014), GERD (gastroesophageal reflux disease), iron deficiency anemia (09/02/2015), Hyperlipidemia, Hypertension, Lumbar pain, Medicare annual wellness visit, subsequent (04/07/2017), OSA on CPAP (08/01/2015), Overweight (BMI 25.0-29.9) (07/10/2013), Prediabetes (09/02/2015), and SOB (shortness of breath) (07/10/2013). Surgical: Jocelyn [name]  has a past surgical history that includes Cataract extraction; Abdominal hysterectomy; Cholecystectomy; and Lumbar disc surgery. Family: family history includes Anuerysm in her son; Cancer in her daughter  and father; Diabetes in her daughter and mother; Heart disease in her mother; Hypertension in her daughter.  Constitutional Exam  General appearance: Well nourished, well developed, and well hydrated. In no apparent acute distress Vitals:   03/22/18 1150  BP: 129/67  Pulse: 68  Resp: 18  Temp: 98.6 F (37 C)  TempSrc: Oral  SpO2: 96%  Weight: 157 lb (71.2 kg)  Height: _0  (1.6 m)   BMI Assessment: Estimated body mass index is 27.81 kg/m as calculated from the following:   Height as of this encounter: _1  (1.6 m).   Weight as of this encounter: 157 lb (71.2 kg).  BMI interpretation table: BMI level Category Range association with higher incidence of chronic pain  <18 kg/m2 Underweight   18.5-24.9 kg/m2 Ideal body weight   25-29.9 kg/m2 Overweight Increased incidence by 20%  30-34.9 kg/m2 Obese (Class I) Increased incidence by 68%  35-39.9 kg/m2 Severe obesity (Class II) Increased incidence by 136%  >40 kg/m2 Extreme obesity (Class III) Increased incidence by 254%   Patient's current BMI Ideal Body weight  Body mass index is 27.81 kg/m. Ideal body weight: 52.4 kg (115 lb 8.3 oz) Adjusted ideal body weight: 59.9 kg (132 lb 1.8 oz)   BMI Readings from Last 4 Encounters:  03/22/18 27.81 kg/m  03/06/18 26.13 kg/m  02/23/18 28.17 kg/m  02/22/18 28.17 kg/m   Wt Readings from Last 4 Encounters:  03/22/18 157 lb (71.2 kg)  03/06/18 157 lb (71.2 kg)  02/23/18 159 lb (72.1 kg)  02/22/18 159 lb (72.1 kg)  Psych/Mental status: Alert, oriented x 3 (person, place, & time)       Eyes: PERLA Respiratory: No evidence of acute respiratory distress  Cervical Spine Area Exam  Skin & Axial Inspection: No masses, redness, edema, swelling, or associated skin lesions Alignment: Symmetrical Functional ROM: Unrestricted ROM      Stability: No instability detected Muscle Tone/Strength: Functionally intact. No obvious neuro-muscular anomalies detected. Sensory (Neurological):  Unimpaired Palpation: No palpable anomalies               Thoracic Spine Area Exam  Skin & Axial Inspection: No masses, redness, or swelling Alignment: Symmetrical Functional ROM: Decreased ROM Stability: No instability detected Muscle Tone/Strength: Functionally intact. No obvious neuro-muscular anomalies detected. Sensory (Neurological): Musculoskeletal pain pattern Muscle strength & Tone: No palpable anomalies  Lumbar Spine Area Exam  Skin & Axial Inspection: No masses, redness, or swelling Alignment: Symmetrical Functional ROM: Decreased ROM       Stability: No instability detected Muscle Tone/Strength: Functionally intact. No obvious neuro-muscular anomalies detected. Sensory (Neurological): Articular pain pattern Palpation: Complains of area being tender to palpation       Provocative Tests: Hyperextension/rotation test: (+) bilaterally for facet joint pain. Lumbar quadrant test (Kemp's test): deferred today       Lateral bending test: deferred today       Patrick's Maneuver: deferred today                   FABER* test: deferred today                   S-I anterior distraction/compression test: deferred today         S-I lateral compression test: deferred today         S-I Thigh-thrust test: deferred today         S-I Gaenslen's test: deferred today         *(Flexion, ABduction and External Rotation)  Gait & Posture Assessment  Ambulation: Patient came in today in a wheel chair Gait: Limited. Using assistive device to ambulate Posture: Difficulty standing up straight, due to pain   Lower Extremity Exam    Side: Right lower extremity  Side: Left lower extremity  Stability: No instability observed          Stability: No instability observed          Skin & Extremity Inspection: Skin color, temperature, and hair growth are WNL. No peripheral edema or cyanosis. No masses, redness, swelling, asymmetry, or associated skin lesions. No contractures.  Skin & Extremity  Inspection: Skin color, temperature, and hair growth are WNL. No peripheral edema or cyanosis. No masses, redness, swelling, asymmetry, or associated skin lesions. No contractures.  Functional ROM: Decreased ROM for hip and knee joints          Functional ROM: Diminished ROM for hip and knee joints          Muscle Tone/Strength: Functionally intact. No obvious neuro-muscular anomalies detected.  Muscle Tone/Strength: Functionally intact. No obvious neuro-muscular anomalies detected.  Sensory (Neurological): Arthropathic arthralgia        Sensory (Neurological): Arthropathic arthralgia        DTR: Patellar: 0: absent Achilles: deferred today Plantar: deferred today  DTR: Patellar: 0: absent Achilles: deferred today Plantar: deferred today  Palpation: No palpable anomalies  Palpation: No palpable anomalies   Assessment & Plan  Primary Diagnosis & Pertinent Problem List: The primary encounter diagnosis was Lumbar degenerative disc disease. Diagnoses of Lumbar facet arthropathy, Chronic SI joint pain, SI joint arthritis, Chronic pain of both knees, and Chronic pain syndrome were also pertinent to this visit.  Visit Diagnosis: 1. Lumbar degenerative disc disease   2. Lumbar facet arthropathy   3. Chronic SI joint pain   4. SI joint arthritis   5. Chronic pain of both knees   6. Chronic pain syndrome    Problems updated and reviewed during this visit: Problem  Lumbar Facet Arthropathy  Chronic Si Joint Pain  Si Joint Arthritis  Chronic Pain of Both Knees  Chronic Pain  Syndrome  Lumbar Degenerative Disc Disease   Jocelyn [name] has a history of greater than 3 months of moderate to severe pain which is resulted in functional impairment.  The patient has tried various conservative therapeutic options such as NSAIDs, Tylenol, muscle relaxants, physical therapy which was inadequately effective.  Patient's pain is predominantly axial with physical exam findings suggestive of facet  arthropathy. Lumbar facet medial branch nerve blocks were discussed with the patient.  Risks and benefits were reviewed.  Patient would like to proceed with bilateral L3, L4, L5, S1 medial branch nerve block.  For her SI joint pain/arthropathy, discussed diagnostic bilateral sacroiliac joint injections.  For her bilateral knee osteoarthritis, we discussed bilateral diagnostic genicular nerve block and possible radiofrequency ablation.  Since patient's axial low back pain is worse, will start with diagnostic lumbar facet medial branch nerve blocks and proceed from there.  In regards to medication management, we will have patient sign opioid contract and start hydrocodone 5 mg daily PRN.  Patient instructed to continue all of her other multimodal analgesics.  Plan of Care  Pharmacotherapy (Medications Ordered): Meds ordered this encounter  Medications  . HYDROcodone-acetaminophen (NORCO/VICODIN) 5-325 MG tablet    Sig: Take 1 tablet by mouth daily as needed for up to 30 days for moderate pain.    Dispense:  30 tablet    Refill:  0    CHRONIC pain;   Lab-work, procedure(s), and/or referral(s): Orders Placed This Encounter  Procedures  . LUMBAR FACET(MEDIAL BRANCH NERVE BLOCK) MBNB    Interventional management options:  Considering:   Bilateral diagnostic lumbar facet medial branch nerve blocks L3, L4, L5, S1 Lumbar facet radiofrequency ablation Bilateral sacroiliac joint injection Bilateral genicular nerve block Genicular nerve radiofrequency ablation   PRN Procedures:   To be determined at a later time   Time Note: Greater than 50% of the 40 minute(s) of face-to-face time spent with Jocelyn [name], was spent in counseling/coordination of care regarding: the appropriate use of the pain scale, Jocelyn [name]'s primary cause of pain, the results of her recent test(s), the significance of each one oth the test(s) anomalies and it's corresponding characteristic pain pattern(s), the treatment  plan, treatment alternatives, the risks and possible complications of proposed treatment, medication side effects, going over the informed consent, the opioid analgesic risks and possible complications, the results, interpretation and significance of  her recent diagnostic interventional treatment(s), the appropriate use of her medications, realistic expectations, the goals of pain management (increased in functionality), the medication agreement and the patient's responsibilities when it comes to controlled substances.  Provider-requested follow-up: Return for Procedure.  Future Appointments  Date Time Provider [place]  04/09/2018  8:40 AM Arnetha Courser, MD [facility]  04/11/2018  9:30 AM Gillis Santa, MD [facility] None  05/24/2018 11:40 AM Lada, Satira Anis, MD [facility]    Primary Care Physician: Arnetha Courser, MD Location: [facility] Note by: Gillis Santa, M.D Date: 03/22/2018; Time: 2:22 PM  Patient Instructions  1. Contract 2. Return for lumbar facets blocks  One prescription for Hydrocodone has been sent to your pharmacy.  ____________________________________________________________________________________________  Preparing for Procedure with Sedation  Instructions: . Oral Intake: Do not eat or drink anything for at least 8 hours prior to your procedure. . Transportation: Public transportation is not allowed. Bring an adult driver. The driver must be physically present in our waiting room before any procedure can be started. Marland Kitchen Physical Assistance: Bring an adult physically capable of  assisting you, in the event you need help. This adult should keep you company at home for at least 6 hours after the procedure. . Blood Pressure Medicine: Take your blood pressure medicine with a sip of water the morning of the procedure. . Blood thinners: Notify our staff if you are taking any blood thinners. Depending on which one you take, there will  be specific instructions on how and when to stop it. . Diabetics on insulin: Notify the staff so that you can be scheduled 1st case in the morning. If your diabetes requires high dose insulin, take only  of your normal insulin dose the morning of the procedure and notify the staff that you have done so. . Preventing infections: Shower with an antibacterial soap the morning of your procedure. . Build-up your immune system: Take 1000 mg of Vitamin C with every meal (3 times a day) the day prior to your procedure. Marland Kitchen Antibiotics: Inform the staff if you have a condition or reason that requires you to take antibiotics before dental procedures. . Pregnancy: If you are pregnant, call and cancel the procedure. . Sickness: If you have a cold, fever, or any active infections, call and cancel the procedure. . Arrival: You must be in the facility at least 30 minutes prior to your scheduled procedure. . Children: Do not bring children with you. . Dress appropriately: Bring dark clothing that you would not mind if they get stained. . Valuables: Do not bring any jewelry or valuables.  Procedure appointments are reserved for interventional treatments only. Marland Kitchen No Prescription Refills. . No medication changes will be discussed during procedure appointments. . No disability issues will be discussed.  Reasons to call and reschedule or cancel your procedure: (Following these recommendations will minimize the risk of a serious complication.) . Surgeries: Avoid having procedures within 2 weeks of any surgery. (Avoid for 2 weeks before or after any surgery). . Flu Shots: Avoid having procedures within 2 weeks of a flu shots or . (Avoid for 2 weeks before or after immunizations). . Barium: Avoid having a procedure within 7-10 days after having had a radiological study involving the use of radiological contrast. (Myelograms, Barium swallow or enema study). . Heart attacks: Avoid any elective procedures or surgeries for  the initial 6 months after a "Myocardial Infarction" (Heart Attack). . Blood thinners: It is imperative that you stop these medications before procedures. Let us know if you if you take any blood thinner.  . Infection: Avoid procedures during or within two weeks of an infection (including chest colds or gastrointestinal problems). Symptoms associated with infections include: Localized redness, fever, chills, night sweats or profuse sweating, burning sensation when voiding, cough, congestion, stuffiness, runny nose, sore throat, diarrhea, nausea, vomiting, cold or Flu symptoms, recent or current infections. It is specially important if the infection is over the area that we intend to treat. Marland Kitchen Heart and lung problems: Symptoms that may suggest an active cardiopulmonary problem include: cough, chest pain, breathing difficulties or shortness of breath, dizziness, ankle swelling, uncontrolled high or unusually low blood pressure, and/or palpitations. If you are experiencing any of these symptoms, cancel your procedure and contact your primary care physician for an evaluation.  Remember:  Regular Business hours are:  Monday to Thursday 8:00 AM to 4:00 PM  Provider's Schedule: Milinda Pointer, MD:  Procedure days: Tuesday and Thursday 7:30 AM to 4:00 PM  Gillis Santa, MD:  Procedure days: Monday and Wednesday 7:30 AM to 4:00 PM ____________________________________________________________________________________________

## 2018-03-22 NOTE — Progress Notes (Signed)
Safety precautions to be maintained throughout the outpatient stay will include: orient to surroundings, keep bed in low position, maintain call bell within reach at all times, provide assistance with transfer out of bed and ambulation.  

## 2018-03-27 ENCOUNTER — Encounter: Payer: Self-pay | Admitting: Family Medicine

## 2018-03-27 ENCOUNTER — Telehealth: Payer: Self-pay | Admitting: Family Medicine

## 2018-03-27 DIAGNOSIS — R195 Other fecal abnormalities: Secondary | ICD-10-CM | POA: Insufficient documentation

## 2018-03-27 NOTE — Telephone Encounter (Signed)
Copied from [place] [phone]. Topic: Quick Communication - Home Health Verbal Orders >> Feb 22, 2018  9:23 AM Margot Ables wrote: Caller/Agency: Leda Gauze PT w/[facility] Number: [phone], secure VM if not available Requesting OT/PT/Skilled Nursing/Social Work: PT Frequency: effective 02/11/2018 requesting VO to extend PT 2x week for 3 weeks  [facility] called again and requests that the orders be refaxed for PT from 1/12 - 2/01 because some pages were missing.  Please call Darius at [phone]. Ext. [phone], Fax # [phone]

## 2018-03-27 NOTE — Telephone Encounter (Signed)
Verbals give, and asked Jocelyn [name] to refax orders to be signed

## 2018-03-27 NOTE — Telephone Encounter (Signed)
That's fine for orders If they'll send what I need to sign, I'll sign it

## 2018-03-29 ENCOUNTER — Telehealth: Payer: Self-pay | Admitting: Family Medicine

## 2018-03-29 NOTE — Telephone Encounter (Signed)
I spoke with the patient's daughter, and we rescheduled her AWV-S from 04/09/2018 with Dr. Sanda Klein to 04/13/2018 with [facility].  I explained that she will still keep her appt w/ Dr. Sanda Klein on 05/24/2018. VDM (DD)

## 2018-04-04 ENCOUNTER — Telehealth: Payer: Self-pay

## 2018-04-04 NOTE — Telephone Encounter (Signed)
Patient has been informed that her insurance will not cover anesthesia cost for her scheduled colonoscopy on 04/09/18 at [facility].  She was given the option to reschedule at [facility] or remain on the schedule and when she receives her bill to make payment arrangements.  She has agreed to stay on as scheduled.  The phone number has been provided to her to contact [employer] in regards to this.  Thanks [employer]

## 2018-04-09 ENCOUNTER — Ambulatory Visit: Payer: Medicare Other | Admitting: Anesthesiology

## 2018-04-09 ENCOUNTER — Encounter: Payer: Medicare Other | Admitting: Family Medicine

## 2018-04-09 ENCOUNTER — Ambulatory Visit
Admission: RE | Admit: 2018-04-09 | Discharge: 2018-04-09 | Disposition: A | Discharge status: Home / Self Care | Payer: Medicare Other | Attending: Gastroenterology | Admitting: Gastroenterology

## 2018-04-09 ENCOUNTER — Other Ambulatory Visit: Payer: Self-pay

## 2018-04-09 ENCOUNTER — Other Ambulatory Visit: Payer: Self-pay | Admitting: Family Medicine

## 2018-04-09 ENCOUNTER — Encounter
Admission: RE | Disposition: A | Discharge status: Home / Self Care | Payer: Self-pay | Source: Home / Self Care | Attending: Gastroenterology

## 2018-04-09 DIAGNOSIS — M170 Bilateral primary osteoarthritis of knee: Secondary | ICD-10-CM | POA: Diagnosis not present

## 2018-04-09 DIAGNOSIS — I10 Essential (primary) hypertension: Secondary | ICD-10-CM | POA: Diagnosis not present

## 2018-04-09 DIAGNOSIS — K633 Ulcer of intestine: Secondary | ICD-10-CM | POA: Insufficient documentation

## 2018-04-09 DIAGNOSIS — K5790 Diverticulosis of intestine, part unspecified, without perforation or abscess without bleeding: Secondary | ICD-10-CM | POA: Diagnosis not present

## 2018-04-09 DIAGNOSIS — K639 Disease of intestine, unspecified: Secondary | ICD-10-CM

## 2018-04-09 DIAGNOSIS — K219 Gastro-esophageal reflux disease without esophagitis: Secondary | ICD-10-CM | POA: Diagnosis not present

## 2018-04-09 DIAGNOSIS — R933 Abnormal findings on diagnostic imaging of other parts of digestive tract: Secondary | ICD-10-CM | POA: Diagnosis not present

## 2018-04-09 DIAGNOSIS — R8589 Other abnormal findings in specimens from digestive organs and abdominal cavity: Secondary | ICD-10-CM | POA: Insufficient documentation

## 2018-04-09 DIAGNOSIS — Z79899 Other long term (current) drug therapy: Secondary | ICD-10-CM | POA: Insufficient documentation

## 2018-04-09 DIAGNOSIS — K55031 Focal (segmental) acute (reversible) ischemia of large intestine: Secondary | ICD-10-CM | POA: Diagnosis not present

## 2018-04-09 DIAGNOSIS — Z7982 Long term (current) use of aspirin: Secondary | ICD-10-CM | POA: Diagnosis not present

## 2018-04-09 DIAGNOSIS — K649 Unspecified hemorrhoids: Secondary | ICD-10-CM | POA: Diagnosis not present

## 2018-04-09 DIAGNOSIS — K529 Noninfective gastroenteritis and colitis, unspecified: Secondary | ICD-10-CM

## 2018-04-09 DIAGNOSIS — E785 Hyperlipidemia, unspecified: Secondary | ICD-10-CM | POA: Diagnosis not present

## 2018-04-09 DIAGNOSIS — K644 Residual hemorrhoidal skin tags: Secondary | ICD-10-CM | POA: Insufficient documentation

## 2018-04-09 DIAGNOSIS — G4733 Obstructive sleep apnea (adult) (pediatric): Secondary | ICD-10-CM | POA: Insufficient documentation

## 2018-04-09 DIAGNOSIS — K6389 Other specified diseases of intestine: Secondary | ICD-10-CM | POA: Diagnosis not present

## 2018-04-09 HISTORY — PX: COLONOSCOPY WITH PROPOFOL: SHX5780

## 2018-04-09 SURGERY — COLONOSCOPY WITH PROPOFOL
Anesthesia: General

## 2018-04-09 MED ORDER — SODIUM CHLORIDE 0.9 % IV SOLN
INTRAVENOUS | Status: DC
  Administered 2018-04-09: 1000 mL via INTRAVENOUS

## 2018-04-09 MED ORDER — LIDOCAINE HCL (CARDIAC) PF 100 MG/5ML IV SOSY
PREFILLED_SYRINGE | INTRAVENOUS | Status: DC | PRN
  Administered 2018-04-09: 80 mg via INTRAVENOUS

## 2018-04-09 MED ORDER — PROPOFOL 10 MG/ML IV BOLUS
INTRAVENOUS | Status: AC
  Filled 2018-04-09: qty 20

## 2018-04-09 MED ORDER — PROPOFOL 10 MG/ML IV BOLUS
INTRAVENOUS | Status: DC | PRN
  Administered 2018-04-09: 60 mg via INTRAVENOUS

## 2018-04-09 MED ORDER — PROPOFOL 500 MG/50ML IV EMUL
INTRAVENOUS | Status: DC | PRN
  Administered 2018-04-09: 125 ug/kg/min via INTRAVENOUS

## 2018-04-09 MED ORDER — LIDOCAINE HCL (PF) 1 % IJ SOLN
INTRAMUSCULAR | Status: AC
  Administered 2018-04-09: 0.3 mL
  Filled 2018-04-09: qty 2

## 2018-04-09 NOTE — Transfer of Care (Signed)
Immediate Anesthesia Transfer of Care Note  Patient: Jocelyn [name]  Procedure(s) Performed: COLONOSCOPY WITH PROPOFOL (N/A )  Patient Location: PACU  Anesthesia Type:General  Level of Consciousness: sedated  Airway & Oxygen Therapy: Patient Spontanous Breathing  Post-op Assessment: Report given to RN and Post -op Vital signs reviewed and stable  Post vital signs: Reviewed and stable  Last Vitals:  Vitals Value Taken Time  BP    Temp    Pulse 71 04/09/2018  9:40 AM  Resp 19 04/09/2018  9:40 AM  SpO2 96 % 04/09/2018  9:40 AM  Vitals shown include unvalidated device data.  Last Pain:  Vitals:   04/09/18 0835  TempSrc: Tympanic  PainSc: 0-No pain         Complications: No apparent anesthesia complications

## 2018-04-09 NOTE — Anesthesia Preprocedure Evaluation (Signed)
Anesthesia Evaluation  Patient identified by MRN, date of birth, ID band Patient awake    Airway Mallampati: III       Dental   Pulmonary sleep apnea and Continuous Positive Airway Pressure Ventilation ,    Pulmonary exam normal        Cardiovascular hypertension, + angina + CAD  Normal cardiovascular exam     Neuro/Psych  Neuromuscular disease CVA negative psych ROS   GI/Hepatic Neg liver ROS, GERD  ,  Endo/Other  negative endocrine ROS  Renal/GU negative Renal ROS     Musculoskeletal  (+) Arthritis ,   Abdominal Normal abdominal exam  (+)   Peds negative pediatric ROS (+)  Hematology  (+) anemia ,   Anesthesia Other Findings   Reproductive/Obstetrics                             Anesthesia Physical Anesthesia Plan  ASA: III  Anesthesia Plan: General   Post-op Pain Management:    Induction: Intravenous  PONV Risk Score and Plan: Propofol infusion  Airway Management Planned: Nasal Cannula  Additional Equipment:   Intra-op Plan:   Post-operative Plan:   Informed Consent: I have reviewed the patients History and Physical, chart, labs and discussed the procedure including the risks, benefits and alternatives for the proposed anesthesia with the patient or authorized representative who has indicated his/her understanding and acceptance.     Dental advisory given  Plan Discussed with: CRNA and Surgeon  Anesthesia Plan Comments:         Anesthesia Quick Evaluation

## 2018-04-09 NOTE — Op Note (Signed)
[facility]astroenterology Patient Name: Jocelyn [name] Procedure Date: 04/09/2018 9:11 AM MRN: [id number] Account #: [id number] Date of Birth: 1931/06/02 Admit Type: Outpatient Age: 85 Room: [facility] ENDO ROOM 2 Gender: Female Note Status: Finalized Procedure:            Colonoscopy Indications:          Abnormal CT of the GI tract, cecal thickening and                        ulceration on previous colonoscopy Providers:            Lin Landsman MD, MD Referring MD:         Arnetha Courser (Referring MD) Medicines:            Monitored Anesthesia Care Complications:        No immediate complications. Estimated blood loss:                        Minimal. Procedure:            Pre-Anesthesia Assessment:                       - Prior to the procedure, a History and Physical was                        performed, and patient medications and allergies were                        reviewed. The patient is competent. The risks and                        benefits of the procedure and the sedation options and                        risks were discussed with the patient. All questions                        were answered and informed consent was obtained.                        Patient identification and proposed procedure were                        verified by the physician, the nurse, the                        anesthesiologist, the anesthetist and the technician in                        the pre-procedure area in the procedure room in the                        endoscopy suite. Mental Status Examination: alert and                        oriented. Airway Examination: normal oropharyngeal                        airway and neck mobility. Respiratory Examination:  clear to auscultation. CV Examination: normal.                        Prophylactic Antibiotics: The patient does not require                        prophylactic antibiotics. Prior  Anticoagulants: The                        patient has taken aspirin, last dose was 1 day prior to                        procedure. ASA Grade Assessment: III - A patient with                        severe systemic disease. After reviewing the risks and                        benefits, the patient was deemed in satisfactory                        condition to undergo the procedure. The anesthesia plan                        was to use monitored anesthesia care (MAC). Immediately                        prior to administration of medications, the patient was                        re-assessed for adequacy to receive sedatives. The                        heart rate, respiratory rate, oxygen saturations, blood                        pressure, adequacy of pulmonary ventilation, and                        response to care were monitored throughout the                        procedure. The physical status of the patient was                        re-assessed after the procedure.                       After obtaining informed consent, the colonoscope was                        passed under direct vision. Throughout the procedure,                        the patient's blood pressure, pulse, and oxygen                        saturations were monitored continuously. The  Colonoscope was introduced through the anus and                        advanced to the the ileocecal valve. The colonoscopy                        was performed without difficulty. The patient tolerated                        the procedure well. The quality of the bowel                        preparation was evaluated using the BBPS [facility] Bowel                        Preparation Scale) with scores of: Right Colon = 3,                        Transverse Colon = 3 and Left Colon = 3 (entire mucosa                        seen well with no residual staining, small fragments of                        stool or opaque  liquid). The total BBPS score equals 9. Findings:      Skin tags were found on perianal exam.      Unable to intubate cecum as it was fibrotic with chronic ulcer and       compressed lumen. No well-defined mass/tumor is seen. Two other small       ulcers are present in proximal ascending colon      Biopsies were taken with a cold forceps for histology.      Non-bleeding external hemorrhoids were found during retroflexion. The       hemorrhoids were large.      The exam was otherwise without abnormality.      Multiple diverticula were found in the sigmoid colon. There was no       evidence of diverticular bleeding. Impression:           - Perianal skin tags found on perianal exam.                       - Ulcerated cecum and proximal ascending colon,                        healing, likely secondary to right sided ischemic                        colitis. Biopsied.                       - Non-bleeding external hemorrhoids.                       - The examination was otherwise normal. Recommendation:       - Discharge patient to home (with escort).                       - Resume previous diet today.                       -  Continue present medications.                       - Await pathology results.                       - Recommend referral to vascular surgery as patient has                        severe atherosclerosis of aorta and iliacs Procedure Code(s):    --- Professional ---                       [phone], Colonoscopy, flexible; with biopsy, single or                        multiple Diagnosis Code(s):    --- Professional ---                       K63.3, Ulcer of intestine                       K64.4, Residual hemorrhoidal skin tags                       R93.3, Abnormal findings on diagnostic imaging of other                        parts of digestive tract CPT copyright 2018 American Medical Association. All rights reserved. The codes documented in this report are preliminary and  upon coder review may  be revised to meet current compliance requirements. Dr. Ulyess Mort Lin Landsman MD, MD 04/09/2018 9:44:10 AM This report has been signed electronically. Number of Addenda: 0 Note Initiated On: 04/09/2018 9:11 AM Scope Withdrawal Time: 0 hours 10 minutes 59 seconds  Total Procedure Duration: 0 hours 16 minutes 46 seconds       [facility]

## 2018-04-09 NOTE — Anesthesia Postprocedure Evaluation (Signed)
Anesthesia Post Note  Patient: Jocelyn [name]  Procedure(s) Performed: COLONOSCOPY WITH PROPOFOL (N/A )  Anesthesia Type: General Level of consciousness: awake and alert and oriented Pain management: pain level controlled Vital Signs Assessment: post-procedure vital signs reviewed and stable Respiratory status: spontaneous breathing Cardiovascular status: blood pressure returned to baseline Anesthetic complications: no     Last Vitals:  Vitals:   04/09/18 0950 04/09/18 1000  BP: 127/66 (!) 148/75  Pulse: 73 78  Resp: 17   Temp:    SpO2: 98% 97%    Last Pain:  Vitals:   04/09/18 1000  TempSrc:   PainSc: 0-No pain                 Oktober Glazer

## 2018-04-09 NOTE — Anesthesia Post-op Follow-up Note (Signed)
Anesthesia QCDR form completed.        

## 2018-04-09 NOTE — H&P (Signed)
Cephas Darby, MD [address]  [place]  [place], [place] [address]  Main: [phone]  Fax: [phone] Pager: [phone]  Primary Care Physician:  Arnetha Courser, MD Primary Gastroenterologist:  Dr. Cephas Darby  Pre-Procedure History & Physical: HPI:  Jocelyn [name] is a 85 y.o. female is here for an colonoscopy.   Past Medical History:  Diagnosis Date  . Arthritis of both knees 08/01/2015  . Colitis   . Essential hypertension, benign 07/28/2014  . GERD (gastroesophageal reflux disease)   . Hx of iron deficiency anemia 09/02/2015  . Hyperlipidemia   . Hypertension   . Lumbar pain   . Medicare annual wellness visit, subsequent 04/07/2017  . OSA on CPAP 08/01/2015  . Overweight (BMI 25.0-29.9) 07/10/2013  . Prediabetes 09/02/2015  . SOB (shortness of breath) 07/10/2013    Past Surgical History:  Procedure Laterality Date  . ABDOMINAL HYSTERECTOMY    . CATARACT EXTRACTION    . CHOLECYSTECTOMY    . LUMBAR DISC SURGERY      Prior to Admission medications   Medication Sig Start Date End Date Taking? Authorizing Provider  albuterol (PROVENTIL HFA;VENTOLIN HFA) 108 (90 Base) MCG/ACT inhaler Inhale into the lungs. 08/26/15 03/06/18  [provider]  aspirin EC 81 MG tablet Take 1 tablet (81 mg total) by mouth daily. 12/15/14   Ashok Norris, MD  calcium-vitamin D 250-100 MG-UNIT tablet Take 1 tablet by mouth 2 (two) times daily.    [provider]  cloNIDine (CATAPRES) 0.1 MG tablet TAKE 1 TABLET (0.1 MG TOTAL) BY MOUTH EVERY 8 (EIGHT) HOURS. 08/07/17   Lada, Satira Anis, MD  diclofenac sodium (VOLTAREN) 1 % GEL Apply 4 g topically 4 (four) times daily as needed. On the knees 01/11/18   Lada, Satira Anis, MD  famotidine (PEPCID) 20 MG tablet Take 1 tablet (20 mg total) by mouth 2 (two) times daily. If needed for heartburn; do not take ranitidine 12/08/17   Lada, Satira Anis, MD  ferrous sulfate 325 (65 FE) MG tablet Take 325 mg by mouth daily. 01/03/18 01/03/19   [provider]  gabapentin (NEURONTIN) 100 MG capsule Take 100 mg by mouth daily. 04/29/17   [provider]  HYDROcodone-acetaminophen (NORCO/VICODIN) 5-325 MG tablet Take 1 tablet by mouth daily as needed for up to 30 days for moderate pain. 03/22/18 04/21/18  Gillis Santa, MD  isosorbide mononitrate (IMDUR) 120 MG 24 hr tablet TAKE 1 TABLET BY MOUTH EVERY DAY 02/11/18   Lada, Satira Anis, MD  Multiple Vitamins-Minerals (CENTRUM SILVER 50+WOMEN) TABS Take 1 tablet by mouth daily.    [provider]  Olmesartan-amLODIPine-HCTZ 40-10-12.5 MG TABS Take 1 tablet by mouth daily. For blood pressure 06/13/17   Lada, Satira Anis, MD  potassium chloride (KLOR-CON 10) 10 MEQ tablet Take 1 tablet (10 mEq total) by mouth 3 (three) times a week. 02/09/18   Arnetha Courser, MD  pravastatin (PRAVACHOL) 20 MG tablet Take one pill by mouth two nights each week (Wednesdays and Sundays, for example) 12/15/17   Lada, Satira Anis, MD    Allergies as of 03/14/2018  . (No Known Allergies)    Family History  Problem Relation Age of Onset  . Hypertension Daughter   . Diabetes Daughter   . Cancer Daughter        breast  . Diabetes Mother   . Heart disease Mother   . Cancer Father        lung; smoker  . Anuerysm Son  Social History   Socioeconomic History  . Marital status: Widowed    Spouse name: Not on file  . Number of children: Not on file  . Years of education: Not on file  . Highest education level: Not on file  Occupational History  . Not on file  Social Needs  . Financial resource strain: Not on file  . Food insecurity:    Worry: Not on file    Inability: Not on file  . Transportation needs:    Medical: Not on file    Non-medical: Not on file  Tobacco Use  . Smoking status: Never Smoker  . Smokeless tobacco: Never Used  Substance and Sexual Activity  . Alcohol use: No    Alcohol/week: 0.0 standard drinks  . Drug use: No  . Sexual activity: Not Currently    Lifestyle  . Physical activity:    Days per week: Not on file    Minutes per session: Not on file  . Stress: Not on file  Relationships  . Social connections:    Talks on phone: Not on file    Gets together: Not on file    Attends religious service: Not on file    Active member of club or organization: Not on file    Attends meetings of clubs or organizations: Not on file    Relationship status: Not on file  . Intimate partner violence:    Fear of current or ex partner: Not on file    Emotionally abused: Not on file    Physically abused: Not on file    Forced sexual activity: Not on file  Other Topics Concern  . Not on file  Social History Narrative  . Not on file    Review of Systems: See HPI, otherwise negative ROS  Physical Exam: BP (!) 145/67   Pulse 83   Temp (!) 97.4 F (36.3 C) (Tympanic)   Resp 16   Ht 5\' 3"  (1.6 m)   Wt 72.1 kg   SpO2 99%   BMI 28.17 kg/m  General:   Alert,  pleasant and cooperative in NAD Head:  Normocephalic and atraumatic. Neck:  Supple; no masses or thyromegaly. Lungs:  Clear throughout to auscultation.    Heart:  Regular rate and rhythm. Abdomen:  Soft, nontender and nondistended. Normal bowel sounds, without guarding, and without rebound.   Neurologic:  Alert and  oriented x4;  grossly normal neurologically.  Impression/Plan: ELLISA DEVIVO is here for an colonoscopy to be performed for right sided colitis  Risks, benefits, limitations, and alternatives regarding  colonoscopy have been reviewed with the patient.  Questions have been answered.  All parties agreeable.   Sherri Sear, MD  04/09/2018, 9:03 AM

## 2018-04-10 ENCOUNTER — Other Ambulatory Visit: Payer: Self-pay

## 2018-04-10 DIAGNOSIS — K5289 Other specified noninfective gastroenteritis and colitis: Secondary | ICD-10-CM

## 2018-04-10 LAB — SURGICAL PATHOLOGY

## 2018-04-11 ENCOUNTER — Ambulatory Visit: Payer: Medicare Other | Admitting: Student in an Organized Health Care Education/Training Program

## 2018-04-11 ENCOUNTER — Encounter: Payer: Self-pay | Admitting: Gastroenterology

## 2018-04-13 ENCOUNTER — Ambulatory Visit: Payer: Medicare Other

## 2018-04-18 ENCOUNTER — Encounter: Payer: Self-pay | Admitting: Student in an Organized Health Care Education/Training Program

## 2018-04-18 ENCOUNTER — Other Ambulatory Visit: Payer: Self-pay

## 2018-04-18 ENCOUNTER — Ambulatory Visit
Admission: RE | Admit: 2018-04-18 | Discharge: 2018-04-18 | Disposition: A | Discharge status: Home / Self Care | Payer: Medicare Other | Source: Ambulatory Visit | Attending: Student in an Organized Health Care Education/Training Program | Admitting: Student in an Organized Health Care Education/Training Program

## 2018-04-18 ENCOUNTER — Ambulatory Visit (HOSPITAL_BASED_OUTPATIENT_CLINIC_OR_DEPARTMENT_OTHER): Payer: Medicare Other | Admitting: Student in an Organized Health Care Education/Training Program

## 2018-04-18 VITALS — BP 155/72 | HR 75 | Temp 98.1°F | Resp 20 | Ht 63.0 in | Wt 159.0 lb

## 2018-04-18 DIAGNOSIS — M47816 Spondylosis without myelopathy or radiculopathy, lumbar region: Secondary | ICD-10-CM | POA: Insufficient documentation

## 2018-04-18 MED ORDER — FENTANYL CITRATE (PF) 100 MCG/2ML IJ SOLN
25.0000 ug | INTRAMUSCULAR | Status: DC | PRN
  Administered 2018-04-18: 50 ug via INTRAVENOUS

## 2018-04-18 MED ORDER — LIDOCAINE HCL 2 % IJ SOLN
20.0000 mL | Freq: Once | INTRAMUSCULAR | Status: AC
  Administered 2018-04-18: 400 mg

## 2018-04-18 MED ORDER — LIDOCAINE HCL 2 % IJ SOLN
INTRAMUSCULAR | Status: AC
  Filled 2018-04-18: qty 20

## 2018-04-18 MED ORDER — ROPIVACAINE HCL 2 MG/ML IJ SOLN
18.0000 mL | Freq: Once | INTRAMUSCULAR | Status: AC
  Administered 2018-04-18: 18 mL via PERINEURAL
  Filled 2018-04-18: qty 20

## 2018-04-18 MED ORDER — DEXAMETHASONE SODIUM PHOSPHATE 10 MG/ML IJ SOLN
INTRAMUSCULAR | Status: AC
  Filled 2018-04-18: qty 1

## 2018-04-18 MED ORDER — FENTANYL CITRATE (PF) 100 MCG/2ML IJ SOLN
INTRAMUSCULAR | Status: AC
  Filled 2018-04-18: qty 2

## 2018-04-18 MED ORDER — ROPIVACAINE HCL 2 MG/ML IJ SOLN
INTRAMUSCULAR | Status: AC
  Filled 2018-04-18: qty 10

## 2018-04-18 MED ORDER — DEXAMETHASONE SODIUM PHOSPHATE 10 MG/ML IJ SOLN
10.0000 mg | Freq: Once | INTRAMUSCULAR | Status: AC
  Administered 2018-04-18: 10 mg
  Filled 2018-04-18: qty 1

## 2018-04-18 MED ORDER — DEXAMETHASONE SODIUM PHOSPHATE 10 MG/ML IJ SOLN
10.0000 mg | Freq: Once | INTRAMUSCULAR | Status: AC
  Administered 2018-04-18: 10 mg

## 2018-04-18 MED ORDER — HYDROCODONE-ACETAMINOPHEN 5-325 MG PO TABS: 1.0000 | ORAL_TABLET | Freq: Every day | ORAL | 0 refills | Status: DC | PRN

## 2018-04-18 MED ORDER — LACTATED RINGERS IV SOLN
1000.0000 mL | Freq: Once | INTRAVENOUS | Status: AC
  Administered 2018-04-18: 1000 mL via INTRAVENOUS

## 2018-04-18 NOTE — Patient Instructions (Addendum)

## 2018-04-18 NOTE — Progress Notes (Signed)
Patient's Name: Jocelyn [name]  MRN: [id number]  Referring Provider: Arnetha Courser, MD  DOB: 1931-05-26  PCP: Arnetha Courser, MD  DOS: 04/18/2018  Note by: Gillis Santa, MD  Service setting: Ambulatory outpatient  Specialty: Interventional Pain Management  Patient type: Established  Location: [facility] (AMB) Pain Management Facility  Visit type: Interventional Procedure   Primary Reason for Visit: Interventional Pain Management Treatment. CC: Back Pain (lower)  Procedure:          Anesthesia, Analgesia, Anxiolysis:  Type: Lumbar Facet, Medial Branch Block(s) #1  Primary Purpose: Diagnostic Region: Posterolateral Lumbosacral Spine Level: L3, L4, L5, & S1 Medial Branch Level(s). Injecting these levels blocks the L3-4, L4-5, and L5-S1 lumbar facet joints. Laterality: Bilateral  Type: Moderate (Conscious) Sedation combined with Local Anesthesia Indication(s): Analgesia and Anxiety Route: Intravenous (IV) IV Access: Secured Sedation: Meaningful verbal contact was maintained at all times during the procedure  Local Anesthetic: Lidocaine 1-2%  Position: Prone   Indications: 1. Lumbar facet arthropathy    Pain Score: Pre-procedure: 7 /10 Post-procedure: 0-No pain/10  Pre-op Assessment:  Jocelyn [name] is a 85 y.o. (year old), female patient, seen today for interventional treatment. She  has a past surgical history that includes Cataract extraction; Abdominal hysterectomy; Cholecystectomy; Lumbar disc surgery; and Colonoscopy with propofol (N/A, 04/09/2018). Jocelyn [name] has a current medication list which includes the following prescription(s): aspirin ec, calcium-vitamin d, clonidine, diclofenac sodium, famotidine, ferrous sulfate, gabapentin, hydrocodone-acetaminophen, isosorbide mononitrate, centrum silver 50+women, olmesartan-amlodipine-hctz, potassium chloride, pravastatin, and albuterol, and the following Facility-Administered Medications: fentanyl. Her primarily concern today is the Back Pain  (lower)  Initial Vital Signs:  Pulse/HCG Rate: 74ECG Heart Rate: 74 Temp: 98.4 F (36.9 C) Resp: 18 BP: (!) 159/52 SpO2: 97 %  BMI: Estimated body mass index is 28.17 kg/m as calculated from the following:   Height as of this encounter: 5' 3"  (1.6 m).   Weight as of this encounter: 159 lb (72.1 kg).  Risk Assessment: Allergies: Reviewed. She has No Known Allergies.  Allergy Precautions: None required Coagulopathies: Reviewed. None identified.  Blood-thinner therapy: None at this time Active Infection(s): Reviewed. None identified. Jocelyn [name] is afebrile  Site Confirmation: Jocelyn [name] was asked to confirm the procedure and laterality before marking the site Procedure checklist: Completed Consent: Before the procedure and under the influence of no sedative(s), amnesic(s), or anxiolytics, the patient was informed of the treatment options, risks and possible complications. To fulfill our ethical and legal obligations, as recommended by the American Medical Association's Code of Ethics, I have informed the patient of my clinical impression; the nature and purpose of the treatment or procedure; the risks, benefits, and possible complications of the intervention; the alternatives, including doing nothing; the risk(s) and benefit(s) of the alternative treatment(s) or procedure(s); and the risk(s) and benefit(s) of doing nothing. The patient was provided information about the general risks and possible complications associated with the procedure. These may include, but are not limited to: failure to achieve desired goals, infection, bleeding, organ or nerve damage, allergic reactions, paralysis, and death. In addition, the patient was informed of those risks and complications associated to Spine-related procedures, such as failure to decrease pain; infection (i.e.: Meningitis, epidural or intraspinal abscess); bleeding (i.e.: epidural hematoma, subarachnoid hemorrhage, or any other type of  intraspinal or peri-dural bleeding); organ or nerve damage (i.e.: Any type of peripheral nerve, nerve root, or spinal cord injury) with subsequent damage to sensory, motor, and/or autonomic systems, resulting in permanent pain, numbness, and/or weakness  of one or several areas of the body; allergic reactions; (i.e.: anaphylactic reaction); and/or death. Furthermore, the patient was informed of those risks and complications associated with the medications. These include, but are not limited to: allergic reactions (i.e.: anaphylactic or anaphylactoid reaction(s)); adrenal axis suppression; blood sugar elevation that in diabetics may result in ketoacidosis or comma; water retention that in patients with history of congestive heart failure may result in shortness of breath, pulmonary edema, and decompensation with resultant heart failure; weight gain; swelling or edema; medication-induced neural toxicity; particulate matter embolism and blood vessel occlusion with resultant organ, and/or nervous system infarction; and/or aseptic necrosis of one or more joints. Finally, the patient was informed that Medicine is not an exact science; therefore, there is also the possibility of unforeseen or unpredictable risks and/or possible complications that may result in a catastrophic outcome. The patient indicated having understood very clearly. We have given the patient no guarantees and we have made no promises. Enough time was given to the patient to ask questions, all of which were answered to the patient's satisfaction. Jocelyn [name] has indicated that she wanted to continue with the procedure. Attestation: I, the ordering provider, attest that I have discussed with the patient the benefits, risks, side-effects, alternatives, likelihood of achieving goals, and potential problems during recovery for the procedure that I have provided informed consent. Date   Time: 04/18/2018 11:46 AM  Pre-Procedure Preparation:  Monitoring:  As per clinic protocol. Respiration, ETCO2, SpO2, BP, heart rate and rhythm monitor placed and checked for adequate function Safety Precautions: Patient was assessed for positional comfort and pressure points before starting the procedure. Time-out: I initiated and conducted the "Time-out" before starting the procedure, as per protocol. The patient was asked to participate by confirming the accuracy of the "Time Out" information. Verification of the correct person, site, and procedure were performed and confirmed by me, the nursing staff, and the patient. "Time-out" conducted as per Joint Commission's Universal Protocol (UP.01.01.01). Time: 1239  Description of Procedure:          Laterality: Bilateral. The procedure was performed in identical fashion on both sides. Levels:   L3, L4, L5, & S1 Medial Branch Level(s) Area Prepped: Posterior Lumbosacral Region Prepping solution: ChloraPrep (2% chlorhexidine gluconate and 70% isopropyl alcohol) Safety Precautions: Aspiration looking for blood return was conducted prior to all injections. At no point did we inject any substances, as a needle was being advanced. Before injecting, the patient was told to immediately notify me if she was experiencing any new onset of "ringing in the ears, or metallic taste in the mouth". No attempts were made at seeking any paresthesias. Safe injection practices and needle disposal techniques used. Medications properly checked for expiration dates. SDV (single dose vial) medications used. After the completion of the procedure, all disposable equipment used was discarded in the proper designated medical waste containers. Local Anesthesia: Protocol guidelines were followed. The patient was positioned over the fluoroscopy table. The area was prepped in the usual manner. The time-out was completed. The target area was identified using fluoroscopy. A 12-in long, straight, sterile hemostat was used with fluoroscopic guidance to locate  the targets for each level blocked. Once located, the skin was marked with an approved surgical skin marker. Once all sites were marked, the skin (epidermis, dermis, and hypodermis), as well as deeper tissues (fat, connective tissue and muscle) were infiltrated with a small amount of a short-acting local anesthetic, loaded on a 10cc syringe with a 25G, 1.5-in  Needle. An appropriate amount of time was allowed for local anesthetics to take effect before proceeding to the next step. Local Anesthetic: Lidocaine 2.0% The unused portion of the local anesthetic was discarded in the proper designated containers. Technical explanation of process:    L3 Medial Branch Nerve Block (MBB): The target area for the L3 medial branch is at the junction of the postero-lateral aspect of the superior articular process and the superior, posterior, and medial edge of the transverse process of L4. Under fluoroscopic guidance, a Quincke needle was inserted until contact was made with os over the superior postero-lateral aspect of the pedicular shadow (target area). After negative aspiration for blood, 1 mL of the nerve block solution was injected without difficulty or complication. The needle was removed intact. L4 Medial Branch Nerve Block (MBB): The target area for the L4 medial branch is at the junction of the postero-lateral aspect of the superior articular process and the superior, posterior, and medial edge of the transverse process of L5. Under fluoroscopic guidance, a Quincke needle was inserted until contact was made with os over the superior postero-lateral aspect of the pedicular shadow (target area). After negative aspiration for blood, 24m of the nerve block solution was injected without difficulty or complication. The needle was removed intact. L5 Medial Branch Nerve Block (MBB): The target area for the L5 medial branch is at the junction of the postero-lateral aspect of the superior articular process and the superior,  posterior, and medial edge of the sacral ala. Under fluoroscopic guidance, a Quincke needle was inserted until contact was made with os over the superior postero-lateral aspect of the pedicular shadow (target area). After negative aspiration for blood, 1 mL of the nerve block solution was injected without difficulty or complication. The needle was removed intact. S1 Medial Branch Nerve Block (MBB): The target area for the S1 medial branch is at the posterior and inferior 6 o'clock position of the L5-S1 facet joint. Under fluoroscopic guidance, the Quincke needle inserted for the L5 MBB was redirected until contact was made with os over the inferior and postero aspect of the sacrum, at the 6 o' clock position under the L5-S1 facet joint (Target area). After negative aspiration for blood, 153mof the nerve block solution was injected without difficulty or complication. The needle was removed intact.  Nerve block solution: 8 cc solution made of 6 cc of 0.2% Ropivacaine, 2 cc of Decadron 10 mg/cc.  1 cc injected at each level above bilaterally.  Total steroid dose equals 20 mg Decadron. Procedural Needles: 22-gauge, 3.5-inch, Quincke needles used for all levels.  Once the entire procedure was completed, the treated area was cleaned, making sure to leave some of the prepping solution back to take advantage of its long term bactericidal properties.   Illustration of the posterior view of the lumbar spine and the posterior neural structures. Laminae of L2 through S1 are labeled. DPRL5, dorsal primary ramus of L5; DPRS1, dorsal primary ramus of S1; DPR3, dorsal primary ramus of L3; FJ, facet (zygapophyseal) joint L3-L4; I, inferior articular process of L4; LB1, lateral branch of dorsal primary ramus of L1; IAB, inferior articular branches from L3 medial branch (supplies L4-L5 facet joint); IBP, intermediate branch plexus; MB3, medial branch of dorsal primary ramus of L3; NR3, third lumbar nerve root; S, superior  articular process of L5; SAB, superior articular branches from L4 (supplies L4-5 facet joint also); TP3, transverse process of L3.  Vitals:   04/18/18 1253 04/18/18 1259 04/18/18 1308  04/18/18 1317  BP: (!) 150/81 (!) 159/64 136/65 (!) 155/72  Pulse: 74 75    Resp: (!) 22 17 14 20   Temp:   98.1 F (36.7 C)   TempSrc:      SpO2: 95% 96% 99% 99%  Weight:      Height:         Start Time: 1239 hrs. End Time: 1258 hrs.  Imaging Guidance (Spinal):          Type of Imaging Technique: Fluoroscopy Guidance (Spinal) Indication(s): Assistance in needle guidance and placement for procedures requiring needle placement in or near specific anatomical locations not easily accessible without such assistance. Exposure Time: Please see nurses notes. Contrast: None used. Fluoroscopic Guidance: I was personally present during the use of fluoroscopy. "Tunnel Vision Technique" used to obtain the best possible view of the target area. Parallax error corrected before commencing the procedure. "Direction-depth-direction" technique used to introduce the needle under continuous pulsed fluoroscopy. Once target was reached, antero-posterior, oblique, and lateral fluoroscopic projection used confirm needle placement in all planes. Images permanently stored in EMR. Interpretation: No contrast injected. I personally interpreted the imaging intraoperatively. Adequate needle placement confirmed in multiple planes. Permanent images saved into the patient's record.  Antibiotic Prophylaxis:   Anti-infectives (From admission, onward)   None     Indication(s): None identified  Post-operative Assessment:  Post-procedure Vital Signs:  Pulse/HCG Rate: 7569 Temp: 98.1 F (36.7 C) Resp: 20 BP: (!) 155/72 SpO2: 99 %  EBL: None  Complications: No immediate post-treatment complications observed by team, or reported by patient.  Note: The patient tolerated the entire procedure well. A repeat set of vitals were taken  after the procedure and the patient was kept under observation following institutional policy, for this type of procedure. Post-procedural neurological assessment was performed, showing return to baseline, prior to discharge. The patient was provided with post-procedure discharge instructions, including a section on how to identify potential problems. Should any problems arise concerning this procedure, the patient was given instructions to immediately contact us, at any time, without hesitation. In any case, we plan to contact the patient by telephone for a follow-up status report regarding this interventional procedure.  Comments:  No additional relevant information.   Controlled Substance Pharmacotherapy Assessment REMS (Risk Evaluation and Mitigation Strategy)  Analgesic: Hydrocodone 5 mg, quantity 30, last fill 03/22/2018 MME/day: 57m/day.  SHart Rochester RN  04/18/2018 11:57 AM  Sign when Signing Visit Safety precautions to be maintained throughout the outpatient stay will include: orient to surroundings, keep bed in low position, maintain call bell within reach at all times, provide assistance with transfer out of bed and ambulation.    Pharmacokinetics: Liberation and absorption (onset of action): WNL Distribution (time to peak effect): WNL Metabolism and excretion (duration of action): WNL         Pharmacodynamics: Desired effects: Analgesia: Ms. FHakimianreports >50% benefit. Functional ability: Patient reports that medication allows her to accomplish basic ADLs Clinically meaningful improvement in function (CMIF): Sustained CMIF goals met Perceived effectiveness: Described as relatively effective, allowing for increase in activities of daily living (ADL) Undesirable effects: Side-effects or Adverse reactions: None reported Monitoring: [place] PMP: Online review of the past 155-montheriod conducted. Compliant with practice rules and regulations Last UDS on record: Summary  Date  Value Ref Range Status  03/06/2018 FINAL  Final    Comment:    ==================================================================== TOXASSURE COMP DRUG ANALYSIS,UR ==================================================================== Test  Result       Flag       Units Drug Absent but Declared for Prescription Verification   Hydrocodone                    Not Detected UNEXPECTED ng/mg creat   Gabapentin                     Not Detected UNEXPECTED   Acetaminophen                  Not Detected UNEXPECTED    Acetaminophen, as indicated in the declared medication list, is    not always detected even when used as directed.   Diclofenac                     Not Detected UNEXPECTED    Topical diclofenac, as indicated in the declared medication list,    is not always detected even when used as directed.   Salicylate                     Not Detected UNEXPECTED    Aspirin, as indicated in the declared medication list, is not    always detected even when used as directed.   Clonidine                      Not Detected UNEXPECTED ==================================================================== Test                      Result    Flag   Units      Ref Range   Creatinine              41               mg/dL      >=20 ==================================================================== Declared Medications:  The flagging and interpretation on this report are based on the  following declared medications.  Unexpected results may arise from  inaccuracies in the declared medications.  **Note: The testing scope of this panel includes these medications:  Clonidine (Catapres)  Gabapentin (Neurontin)  Hydrocodone (Norco)  **Note: The testing scope of this panel does not include small to  moderate amounts of these reported medications:  Acetaminophen (Norco)  Aspirin (Aspirin 81)  Topical Diclofenac (Voltaren)  **Note: The testing scope of this panel does not include  following  reported medications:  Albuterol  Amlodipine  Calcium (Calcium/Vitamin D)  Famotidine (Pepcid)  Hydrochlorothiazide (HCTZ)  Iron (Ferrous Sulfate)  Isosorbide (Imdur)  Multivitamin (Centrum Silver)  Olmesartan  Potassium (K-Dur)  Pravastatin (Pravachol)  Supplement (Centrum Silver)  Vitamin D (Calcium/Vitamin D) ==================================================================== For clinical consultation, please call [phone]. ====================================================================    UDS interpretation: Compliant          Medication Assessment Form: Reviewed. Patient indicates being compliant with therapy Treatment compliance: Compliant Risk Assessment Profile: Aberrant behavior: See initial evaluations. None observed or detected today Comorbid factors increasing risk of overdose: See initial evaluation. No additional risks detected today Opioid risk tool (ORT):  Opioid Risk  03/22/2018  Alcohol 0  Illegal Drugs 0  Rx Drugs 0  Alcohol 0  Illegal Drugs 0  Rx Drugs 0  Age between 16-45 years  0  History of Preadolescent Sexual Abuse 0  Psychological Disease 0  Depression 0  Opioid Risk Tool Scoring 0  Opioid Risk Interpretation Low Risk    ORT Scoring interpretation table:  Score <  3 = Low Risk for SUD  Score between 4-7 = Moderate Risk for SUD  Score >8 = High Risk for Opioid Abuse   Risk of substance use disorder (SUD): Low  Risk Mitigation Strategies:  Patient Counseling: Covered Patient-Prescriber Agreement (PPA): Present and active  Notification to other healthcare providers: Done  Pharmacologic Plan: No change in therapy, at this time.             Plan of Care  Orders:  Orders Placed This Encounter  Procedures   DG C-Arm 1-60 Min-No Report    Intraoperative interpretation by procedural physician at [place].    Standing Status:   Standing    Number of Occurrences:   1    Order Specific Question:   Reason  for exam:    Answer:   Assistance in needle guidance and placement for procedures requiring needle placement in or near specific anatomical locations not easily accessible without such assistance.   Medications ordered for procedure: Meds ordered this encounter  Medications   lidocaine (XYLOCAINE) 2 % (with pres) injection 400 mg   fentaNYL (SUBLIMAZE) injection 25-50 mcg    Make sure Narcan is available in the pyxis when using this medication. In the event of respiratory depression (RR< 8/min): Titrate NARCAN (naloxone) in increments of 0.1 to 0.2 mg IV at 2-3 minute intervals, until desired degree of reversal.   lactated ringers infusion 1,000 mL   dexamethasone (DECADRON) injection 10 mg   dexamethasone (DECADRON) injection 10 mg   ropivacaine (PF) 2 mg/mL (0.2%) (NAROPIN) injection 18 mL   HYDROcodone-acetaminophen (NORCO/VICODIN) 5-325 MG tablet    Sig: Take 1 tablet by mouth daily as needed for up to 30 days for moderate pain.    Dispense:  30 tablet    Refill:  0    CHRONIC pain;   Medications administered: We administered lidocaine, fentaNYL, lactated ringers, dexamethasone, dexamethasone, and ropivacaine (PF) 2 mg/mL (0.2%).  See the medical record for exact dosing, route, and time of administration.  Disposition: Discharge home  Discharge Date & Time: 04/18/2018;   hrs.   Follow-up plan:   Return in about 5 weeks (around 05/23/2018) for Post Procedure Evaluation, Medication Management.     Future Appointments  Date Time Provider [place]  05/15/2018 11:20 AM [place] ADVISOR [facility]  05/23/2018  1:30 PM Gillis Santa, MD [facility] None  05/24/2018 11:40 AM Lada, Satira Anis, MD [place] [facility]   Primary Care Physician: Arnetha Courser, MD Location: [facility] Outpatient Pain Management Facility Note by: Gillis Santa, MD Date: 04/18/2018; Time: 3:01 PM  Disclaimer:  Medicine is not an exact science. The only guarantee in medicine is that nothing is  guaranteed. It is important to note that the decision to proceed with this intervention was based on the information collected from the patient. The Data and conclusions were drawn from the patient's questionnaire, the interview, and the physical examination. Because the information was provided in large part by the patient, it cannot be guaranteed that it has not been purposely or unconsciously manipulated. Every effort has been made to obtain as much relevant data as possible for this evaluation. It is important to note that the conclusions that lead to this procedure are derived in large part from the available data. Always take into account that the treatment will also be dependent on availability of resources and existing treatment guidelines, considered by other Pain Management Practitioners as being common knowledge and practice, at the time  of the intervention. For Medico-Legal purposes, it is also important to point out that variation in procedural techniques and pharmacological choices are the acceptable norm. The indications, contraindications, technique, and results of the above procedure should only be interpreted and judged by a Board-Certified Interventional Pain Specialist with extensive familiarity and expertise in the same exact procedure and technique.

## 2018-04-18 NOTE — Progress Notes (Signed)
Safety precautions to be maintained throughout the outpatient stay will include: orient to surroundings, keep bed in low position, maintain call bell within reach at all times, provide assistance with transfer out of bed and ambulation.  

## 2018-04-19 ENCOUNTER — Telehealth: Payer: Self-pay

## 2018-04-19 NOTE — Telephone Encounter (Signed)
Denies any needs at this time. Patient states that she is feeling better today.Instructed to call if needed.

## 2018-05-15 ENCOUNTER — Ambulatory Visit: Payer: Medicare Other

## 2018-05-23 ENCOUNTER — Other Ambulatory Visit: Payer: Self-pay

## 2018-05-23 ENCOUNTER — Encounter: Payer: Self-pay | Admitting: Student in an Organized Health Care Education/Training Program

## 2018-05-23 ENCOUNTER — Ambulatory Visit
Payer: Medicare Other | Attending: Student in an Organized Health Care Education/Training Program | Admitting: Student in an Organized Health Care Education/Training Program

## 2018-05-23 ENCOUNTER — Telehealth: Payer: Self-pay

## 2018-05-23 ENCOUNTER — Telehealth: Payer: Self-pay | Admitting: Family Medicine

## 2018-05-23 DIAGNOSIS — G8929 Other chronic pain: Secondary | ICD-10-CM

## 2018-05-23 DIAGNOSIS — M5136 Other intervertebral disc degeneration, lumbar region: Secondary | ICD-10-CM

## 2018-05-23 DIAGNOSIS — M533 Sacrococcygeal disorders, not elsewhere classified: Secondary | ICD-10-CM | POA: Diagnosis not present

## 2018-05-23 DIAGNOSIS — M47818 Spondylosis without myelopathy or radiculopathy, sacral and sacrococcygeal region: Secondary | ICD-10-CM | POA: Diagnosis not present

## 2018-05-23 DIAGNOSIS — M47816 Spondylosis without myelopathy or radiculopathy, lumbar region: Secondary | ICD-10-CM | POA: Diagnosis not present

## 2018-05-23 DIAGNOSIS — M545 Low back pain: Secondary | ICD-10-CM

## 2018-05-23 DIAGNOSIS — G894 Chronic pain syndrome: Secondary | ICD-10-CM

## 2018-05-23 DIAGNOSIS — M25562 Pain in left knee: Secondary | ICD-10-CM

## 2018-05-23 DIAGNOSIS — M25561 Pain in right knee: Secondary | ICD-10-CM | POA: Diagnosis not present

## 2018-05-23 MED ORDER — HYDROCODONE-ACETAMINOPHEN 5-325 MG PO TABS: 1.0000 | ORAL_TABLET | Freq: Every day | ORAL | 0 refills | Status: AC | PRN

## 2018-05-23 MED ORDER — HYDROCODONE-ACETAMINOPHEN 5-325 MG PO TABS: 1.0000 | ORAL_TABLET | Freq: Every day | ORAL | 0 refills | Status: DC | PRN

## 2018-05-23 NOTE — Progress Notes (Signed)
Pain Management Virtual Encounter Note - Virtual Visit via Telephone Telehealth (real-time audio visits between healthcare provider and patient).  Patient's Phone No. & Preferred Pharmacy:  [phone] (home); [phone] (mobile); (Preferred) [phone] No e-mail address on record  [id number] [place], [place] [address] Phone: [phone] Fax: [phone]   Pre-screening note:  Our staff contacted Jocelyn [name] and offered her an "in person", "face-to-face" appointment versus a telephone encounter. She indicated preferring the telephone encounter, at this time.  Reason for Virtual Visit: COVID-19*  Social distancing based on CDC and AMA recommendations.   I contacted Jocelyn [name] on 05/23/2018 at 11:53 AM via telephone and clearly identified myself as Jocelyn Santa, MD. I verified that I was speaking with the correct person using two identifiers (Name and date of birth: Jun 25, 1931).  Advanced Informed Consent I sought verbal advanced consent from Jocelyn [name] for virtual visit interactions. I informed Jocelyn [name] of possible security and privacy concerns, risks, and limitations associated with providing "not-in-person" medical evaluation and management services. I also informed Jocelyn [name] of the availability of "in-person" appointments. Finally, I informed her that there would be a charge for the virtual visit and that she could be  personally, fully or partially, financially responsible for it. Jocelyn [name] expressed understanding and agreed to proceed.   Historic Elements   Jocelyn [name] is a 85 y.o. year old, female patient evaluated today after her last encounter by our practice on 04/19/2018. Jocelyn [name]  has a past medical history of Arthritis of both knees (08/01/2015), Colitis, Essential hypertension, benign (07/28/2014), GERD (gastroesophageal reflux disease), iron deficiency anemia (09/02/2015), Hyperlipidemia, Hypertension,  Lumbar pain, Medicare annual wellness visit, subsequent (04/07/2017), OSA on CPAP (08/01/2015), Overweight (BMI 25.0-29.9) (07/10/2013), Prediabetes (09/02/2015), and SOB (shortness of breath) (07/10/2013). She also  has a past surgical history that includes Cataract extraction; Abdominal hysterectomy; Cholecystectomy; Lumbar disc surgery; and Colonoscopy with propofol (N/A, 04/09/2018). Jocelyn [name] has a current medication list which includes the following prescription(s): albuterol, aspirin ec, calcium-vitamin d, clonidine, diclofenac sodium, famotidine, ferrous sulfate, gabapentin, hydrocodone-acetaminophen, isosorbide mononitrate, centrum silver 50+women, olmesartan-amlodipine-hctz, potassium chloride, pravastatin, and hydrocodone-acetaminophen. She  reports that she has never smoked. She has never used smokeless tobacco. She reports that she does not drink alcohol or use drugs. Jocelyn [name] has No Known Allergies.   HPI  I last saw her on 04/18/2018. She is being evaluated for both, medication management and a post-procedure assessment.  Follow-up for postprocedural evaluation as well as medication management.  Patient did receive benefit after lumbar facet medial branch nerve blocks as detailed per below.  Can consider repeating in the future after COVID-19 restrictions lifted for elective procedures.  Of note patient did sustain a fall today.  Per her family members, she tripped and fell face forward.  She did sustain a nosebleed and has eye swelling.  Encourage family members to continue to monitor.  Recommend icing to eye region and to low back.  Post-Procedure Evaluation  Procedure: Bilateral L3, L4, L5, S1 facet medial branch nerve block #1 on 04/18/2018 Pre-procedure pain level:  7/10 Post-procedure: 0/10          Sedation: Please see nurses note.  Effectiveness during initial hour after procedure(Ultra-Short Term Relief): 100%  Local anesthetic used: Long-acting (4-6 hours) Effectiveness: Defined as  any analgesic benefit obtained secondary to the administration of local anesthetics. This carries significant diagnostic value as to the etiological location, or anatomical  origin, of the pain. Duration of benefit is expected to coincide with the duration of the local anesthetic used.  Effectiveness during initial 4-6 hours after procedure(Short-Term Relief): 100%  Long-term benefit: Defined as any relief past the pharmacologic duration of the local anesthetics.  Effectiveness past the initial 6 hours after procedure(Long-Term Relief): 100 %  Current benefits: Defined as benefit that persist at this time.   Analgesia:  >50% relief Function: Somewhat improved ROM: Somewhat improved  Pharmacotherapy Assessment   03/22/2018  1   03/22/2018  Hydrocodone-Acetamin 5-325 MG  30.00 30 Bi Lat   [id number]   Nor (4618)   0  5.00 MME  Medicare   [place]     Monitoring: Pharmacotherapy: No side-effects or adverse reactions reported. [place] PMP: PDMP reviewed during this encounter.       Compliance: No problems identified or detected. Plan: Refer to "POC".  Review of recent tests  DG C-Arm 1-60 Min-No Report Fluoroscopy was utilized by the requesting physician.  No radiographic  interpretation.    Admission on 04/09/2018, Discharged on 04/09/2018  Component Date Value Ref Range Status  . SURGICAL PATHOLOGY 04/09/2018    Final                   Value:Surgical Pathology CASE: [id number] PATIENT: Jocelyn [name] Surgical Pathology Report     SPECIMEN SUBMITTED: A. Colon ulcer, cecum; cbx  CLINICAL HISTORY: Admitted to [facility] 12/30/2017 for RLQ pain. 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  PRE-OPERATIVE DIAGNOSIS: Bowel wall thickening on follow-up CT K63.9  POST-OPERATIVE DIAGNOSIS: Diverticulosis, ulcerated cecum and proximal ascending colon, healing     DIAGNOSIS: A. CECUM ULCER; COLD BIOPSY - FRAGMENTS OF GRANULATION TISSUE  AND ACUTE INFLAMMATORY EXUDATE CONSISTENT WITH ULCERATION. - MUCOSAL FRAGMENT WITH NONSPECIFIC CRYPT HYPERPLASIA AND ACTIVE INFLAMMATION. - NEGATIVE FOR DYSPLASIA, VIRAL CYTOPATHIC EFFECTS, AND GRANULOMAS.  GROSS DESCRIPTION: A. Labeled: Cecum ulcer C BX Received: Formalin Tissue fragment(s): 3 Size: 0.1-0.2 cm Description: Tan soft tissue fragments Entirely submitted in 1 cassette.   Final Diagnosis performed by                          Bryan Lemma, MD.   Electronically signed 04/10/2018 3:37:39PM The electronic signature indicates that the named Attending Pathologist has evaluated the specimen  Technical component performed at [facility], [address], [place], [place] [address] Lab: [phone] Dir: Rush Farmer, MD, MMM  Professional component performed at [facility], [facility], [place], [place], [place] [address] Lab: [phone] Dir: Dellia Nims. Rubinas, MD    Assessment  The primary encounter diagnosis was Lumbar facet arthropathy. Diagnoses of Lumbar degenerative disc disease, Chronic SI joint pain, SI joint arthritis, Chronic pain of both knees, Chronic pain syndrome, and Chronic bilateral low back pain without sciatica were also pertinent to this visit.  Plan of Care  I am having Jocelyn [name]. Jocelyn [name] start on HYDROcodone-acetaminophen. I am also having her maintain her aspirin EC, albuterol, Centrum Silver 50+Women, gabapentin, Olmesartan-amLODIPine-HCTZ, cloNIDine, calcium-vitamin D, pravastatin, ferrous sulfate, diclofenac sodium, potassium chloride, isosorbide mononitrate, famotidine, and HYDROcodone-acetaminophen.  Refill of hydrocodone as below, fill dates 05/23/2018, 06/22/2018.  Patient did receive benefit after diagnostic lumbar facet medial branch nerve blocks bilaterally at L3, L4, L5, S1.  After COVID-19 restrictions for elective procedures are lifted, would like to see patient in person, conduct physical exam and discussed repeating lumbar facet  medial branch nerve blocks #2  with her.  Also consider bilateral SI joint injection as the patient does have evidence of SI joint arthropathy that is detailed in previous clinic note.  In regards to the patient's recent fall today.  I encouraged the family members to continue to monitor the patient.  Encouraged ice to the eye region.  If things deteriorate, recommend patient follow-up with PCP or go to ED.  Patient endorsed understanding.  Pharmacotherapy (Medications Ordered): Meds ordered this encounter  Medications  . HYDROcodone-acetaminophen (NORCO/VICODIN) 5-325 MG tablet    Sig: Take 1 tablet by mouth daily as needed for up to 30 days for moderate pain.    Dispense:  30 tablet    Refill:  0    CHRONIC pain;  . HYDROcodone-acetaminophen (NORCO/VICODIN) 5-325 MG tablet    Sig: Take 1 tablet by mouth daily as needed for up to 30 days for moderate pain or severe pain. Must last 30 days.    Dispense:  30 tablet    Refill:  0    [place] STOP ACT - Not applicable. Fill one day early if pharmacy is closed on scheduled refill date.   Orders:  No orders of the defined types were placed in this encounter.  Follow-up plan:   Return in about 8 weeks (around 07/18/2018) for Medication Management.   I discussed the assessment and treatment plan with the patient. The patient was provided an opportunity to ask questions and all were answered. The patient agreed with the plan and demonstrated an understanding of the instructions.  Patient advised to call back or seek an in-person evaluation if the symptoms or condition worsens.  Total duration of non-face-to-face encounter: 25 minutes.  Note by: Jocelyn Santa, MD Date: 05/23/2018; Time: 11:53 AM  Disclaimer:  * Given the special circumstances of the COVID-19 pandemic, the federal government has announced that the Office for Civil Rights (OCR) will exercise its enforcement discretion and will not impose penalties on physicians using telehealth in the  event of noncompliance with regulatory requirements under the [place] and Accountability Act (HIPAA) in connection with the good faith provision of telehealth during the MAUQJ-33 national public health emergency. ([place])

## 2018-05-23 NOTE — Telephone Encounter (Signed)
Pts daugher called and stated pt fell this morning. Has a knot on her head, nose bleed and a black eye. Pts family called EMT and she was advised to go to ER and refused.

## 2018-05-23 NOTE — Telephone Encounter (Signed)
She needs to go to the ER I don't see the RN note from the [facility], but yes, she needs to go to the ER now

## 2018-05-23 NOTE — Telephone Encounter (Signed)
Patient daughter called here and she was advised again to go to ER because she probably needs imaging.  I told daughter I would forward message to you and if there was anything else you advised other than ER I would let them know.

## 2018-05-23 NOTE — Telephone Encounter (Signed)
Pt already notified

## 2018-05-23 NOTE — Telephone Encounter (Signed)
Mailed AVS from 05/23/18 visit

## 2018-05-24 ENCOUNTER — Encounter: Payer: Self-pay | Admitting: Family Medicine

## 2018-05-24 ENCOUNTER — Other Ambulatory Visit: Payer: Self-pay

## 2018-05-24 ENCOUNTER — Ambulatory Visit (INDEPENDENT_AMBULATORY_CARE_PROVIDER_SITE_OTHER): Payer: Medicare Other | Admitting: Family Medicine

## 2018-05-24 DIAGNOSIS — G4733 Obstructive sleep apnea (adult) (pediatric): Secondary | ICD-10-CM

## 2018-05-24 DIAGNOSIS — E2839 Other primary ovarian failure: Secondary | ICD-10-CM

## 2018-05-24 DIAGNOSIS — W19XXXD Unspecified fall, subsequent encounter: Secondary | ICD-10-CM

## 2018-05-24 DIAGNOSIS — E782 Mixed hyperlipidemia: Secondary | ICD-10-CM | POA: Diagnosis not present

## 2018-05-24 DIAGNOSIS — Z9989 Dependence on other enabling machines and devices: Secondary | ICD-10-CM

## 2018-05-24 DIAGNOSIS — I10 Essential (primary) hypertension: Secondary | ICD-10-CM | POA: Diagnosis not present

## 2018-05-24 DIAGNOSIS — M47816 Spondylosis without myelopathy or radiculopathy, lumbar region: Secondary | ICD-10-CM

## 2018-05-24 MED ORDER — ALBUTEROL SULFATE HFA 108 (90 BASE) MCG/ACT IN AERS: 2.0000 | INHALATION_SPRAY | Freq: Four times a day (QID) | RESPIRATORY_TRACT | 1 refills | Status: DC | PRN

## 2018-05-24 NOTE — Assessment & Plan Note (Signed)
checking BP at home; continue medicines; limit salt

## 2018-05-24 NOTE — Progress Notes (Signed)
There were no vitals taken for this visit.   Subjective:    Patient ID: Jocelyn [name], female    DOB: 03-27-1931, 85 y.o.   MRN: [id number]  HPI: Jocelyn [name] is a 85 y.o. female  Chief Complaint  Patient presents with   Follow-up   Fall    pt did not go to hospital: refused    HPI Virtual Visit via Telephone/Video Note  Due to national recommendations of social distancing due to the COVID-19 pandemic, an audio/video telehealth visit is felt to be most appropriate for this patient at this time.    I connected with the patient via:  telephone Call started: 12:09 pm I verified that I am speaking with the correct person using two identifiers.  Provider location: home office with door closed, earphones / headset on Patient location: home Additional participants: Foye Clock, daughter  I discussed the limitations, risks, and privacy concerns of performing an evaluation and management service by telephone. I discussed the availability of in-person appointments. I explained that he/she may be responsible for charges related to this service. The patient expressed understanding and agreed to proceed.  Call ended: 12:32 pm Total length of call: 23 minutes and 39 seconds  Regular follow-up  She did fall yesterday; the wheelchair folded up on her, made her fall; it was the walker, not a wheelchair; it folded up on her and she fell, on top of it; hit head on her floor; "I'm okay now"; her eye swelled up bad but it's gone down now; EMS came out to the house and checked her out and they told her it was up to patient if she wanted to go to the ER because of the aspirin; she did not feel any pain, just a knot on her head; her BP was a little high, but it came down; they said if she felt bad, they (EMS) would come right back; she feels alright today; "I feel good today"; daughter checked out the equipment and using it since 2017; she has other walker; she does not need to PT she  says  She had her visit yesterday with the pain clinic yesterday (telephone); it went okay; he did not change any of her medicines; he is prescribing; controlling her pain  Hypertension; BP is back down to normal today; 138/78 most recently; on medications; limiting salt  High cholesterol; on statin; discussed how eating habits could have changed over COVID-19 pandemic; meals on wheels bringing in food daily  Lab Results  Component Value Date   CHOL 208 (H) 12/08/2017   HDL 54 12/08/2017   [place] 132 (H) 12/08/2017   TRIG 116 12/08/2017   CHOLHDL 3.9 12/08/2017   Post-menopausal estrogen deficiency; possible osteoporosis, she never did get the DEXA scan done March 2019; getting plenty of greens for calcium Please do call to schedule your bone density study; the number to schedule one at either [facility] or [facility] is [phone] or [phone]  OSA; managed by pulmonologist; they asked if she was off of the inhaler because of the OSA; she has not used her inhaler now for about a year; she is not coughing or wheezing; she used to see Dr. Raul Del; he prescribed inhaler 3 years ago, she is out; just using PRN  Depression screen [facility] 2/9 04/18/2018 03/22/2018 03/06/2018 02/22/2018 02/07/2018  Decreased Interest 0 0 0 0 0  Down, Depressed, Hopeless 0 0 0 0 0  PHQ - 2 Score 0  0 0 0 0  Altered sleeping - - - 0 0  Tired, decreased energy - - - 0 0  Change in appetite - - - 0 0  Feeling bad or failure about yourself  - - - 0 0  Trouble concentrating - - - 0 0  Moving slowly or fidgety/restless - - - 0 0  Suicidal thoughts - - - 0 0  PHQ-9 Score - - - 0 0  Difficult doing work/chores - - - Not difficult at all Not difficult at all   Fall Risk  05/24/2018 04/18/2018 03/22/2018 03/06/2018 02/22/2018  Falls in the past year? 1 0 0 0 0  Number falls in past yr: 0 - - - -  Injury with Fall? 1 - - - -    Relevant past medical, surgical, family and social history  reviewed Past Medical History:  Diagnosis Date   Arthritis of both knees 08/01/2015   Colitis    Essential hypertension, benign 07/28/2014   GERD (gastroesophageal reflux disease)    Hx of iron deficiency anemia 09/02/2015   Hyperlipidemia    Hypertension    Lumbar pain    Medicare annual wellness visit, subsequent 04/07/2017   OSA on CPAP 08/01/2015   Overweight (BMI 25.0-29.9) 07/10/2013   Prediabetes 09/02/2015   SOB (shortness of breath) 07/10/2013   Past Surgical History:  Procedure Laterality Date   ABDOMINAL HYSTERECTOMY     CATARACT EXTRACTION     CHOLECYSTECTOMY     COLONOSCOPY WITH PROPOFOL N/A 04/09/2018   Procedure: COLONOSCOPY WITH PROPOFOL;  Surgeon: Lin Landsman, MD;  Location: [facility];  Service: Gastroenterology;  Laterality: N/A;   LUMBAR DISC SURGERY     Family History  Problem Relation Age of Onset   Hypertension Daughter    Diabetes Daughter    Cancer Daughter        breast   Diabetes Mother    Heart disease Mother    Cancer Father        lung; smoker   Anuerysm Son    Social History   Tobacco Use   Smoking status: Never Smoker   Smokeless tobacco: Never Used  Substance Use Topics   Alcohol use: No    Alcohol/week: 0.0 standard drinks   Drug use: No     Office Visit from 05/24/2018 in [facility]  AUDIT-C Score  0      Interim medical history since last visit reviewed. Allergies and medications reviewed  Review of Systems Per HPI unless specifically indicated above     Objective:    There were no vitals taken for this visit.  Wt Readings from Last 3 Encounters:  04/18/18 159 lb (72.1 kg)  04/09/18 159 lb (72.1 kg)  03/22/18 157 lb (71.2 kg)    Physical Exam Pulmonary:     Effort: No respiratory distress.  Neurological:     Mental Status: She is alert.     Cranial Nerves: No dysarthria.  Psychiatric:        Speech: Speech is not rapid and pressured, delayed or slurred.         Assessment & Plan:   Problem List Items Addressed This Visit      Cardiovascular and Mediastinum   Essential hypertension, benign (Chronic)    checking BP at home; continue medicines; limit salt        Respiratory   OSA on CPAP    Managed by pulmonologist        Musculoskeletal  and Integument   Lumbar facet arthropathy    Seeing pain clinic Dr. Holley Raring now and he is managing pain, he did not change medicines        Other   HLD (hyperlipidemia)    Discussed checking labs in the context of COVID-19 pandemic; she opts to wait on the labs for another month or two; continue medicine and check labs when safe, patient to pay attention to the news, call if any question; limit saturated fats      Fall - Primary    talked to pt and daughter if walker defective, do they need to contact company; check equipment carefully; daughter says secure now; offered physical therapy; patient declined; advised of symptoms to watch for that may suggest bleed (confusion, neurologic s/s) and call 911 if any develop; daughter present as well; discussed risk of head bleed, hip fracture, worse outcomes with next fall, so taking precautions is paramount       Other Visit Diagnoses    Estrogen deficiency       Relevant Orders   DG Bone Density      Follow up plan: Return in about 3 months (around 08/23/2018) for follow-up visit with Dr. Sanda Klein or just after.  Meds ordered this encounter  Medications   albuterol (VENTOLIN HFA) 108 (90 Base) MCG/ACT inhaler    Sig: Inhale 2 puffs into the lungs every 6 (six) hours as needed for wheezing or shortness of breath.    Dispense:  1 Inhaler    Refill:  1    Orders Placed This Encounter  Procedures   DG Bone Density

## 2018-05-24 NOTE — Assessment & Plan Note (Signed)
Managed by pulmonologist 

## 2018-05-24 NOTE — Assessment & Plan Note (Signed)
Discussed checking labs in the context of COVID-19 pandemic; she opts to wait on the labs for another month or two; continue medicine and check labs when safe, patient to pay attention to the news, call if any question; limit saturated fats

## 2018-05-24 NOTE — Assessment & Plan Note (Signed)
talked to pt and daughter if walker defective, do they need to contact company; check equipment carefully; daughter says secure now; offered physical therapy; patient declined; advised of symptoms to watch for that may suggest bleed (confusion, neurologic s/s) and call 911 if any develop; daughter present as well; discussed risk of head bleed, hip fracture, worse outcomes with next fall, so taking precautions is paramount

## 2018-05-24 NOTE — Assessment & Plan Note (Signed)
Seeing pain clinic Dr. Holley Raring now and he is managing pain, he did not change medicines

## 2018-06-21 ENCOUNTER — Telehealth: Payer: Self-pay | Admitting: Family Medicine

## 2018-06-21 DIAGNOSIS — G4733 Obstructive sleep apnea (adult) (pediatric): Secondary | ICD-10-CM

## 2018-06-21 NOTE — Telephone Encounter (Signed)
Copied from [place] [id number]. Topic: Quick Communication - Rx Refill/Question >> Jun 21, 2018  3:01 PM Jocelyn [name] wrote: Pt is in need of supplies for her Cpap machine. Pt called place about the machine and they advised her that she needs a new sleep study. Pt would like to speak with Dr. Sanda Klein about the matter/ please advise

## 2018-06-22 NOTE — Telephone Encounter (Signed)
We can order new sleep study or try to order new equipment based on patient preference. Please send referral or orders for machine and I will sign.

## 2018-06-22 NOTE — Addendum Note (Signed)
Addended by: Saunders Glance A on: 06/22/2018 10:08 AM   Modules accepted: Orders

## 2018-06-24 ENCOUNTER — Other Ambulatory Visit: Payer: Self-pay | Admitting: Family Medicine

## 2018-06-26 ENCOUNTER — Other Ambulatory Visit: Payer: Self-pay

## 2018-06-26 NOTE — Patient Outreach (Signed)
[facility]) Care Management  06/26/2018  Jocelyn [name] April 21, 1931 [id number]   Medication Adherence call to [name] patient did not answer patient is due on Olmesartan/Amlodipine and Pravacol 20 mg under [name].   [name] Direct Dial [phone]  Fax [phone] [name]

## 2018-06-28 ENCOUNTER — Other Ambulatory Visit: Payer: Self-pay | Admitting: Family Medicine

## 2018-06-28 MED ORDER — CLONIDINE HCL 0.1 MG PO TABS: 0.1000 mg | ORAL_TABLET | Freq: Three times a day (TID) | ORAL | 1 refills | Status: DC

## 2018-06-28 NOTE — Telephone Encounter (Signed)
Copied from [place] [id number]. Topic: Quick Communication - Rx Refill/Question >> Jun 28, 2018  9:41 AM Yvette Rack wrote: Medication: cloNIDine (CATAPRES) 0.1 MG tablet  Has the patient contacted their pharmacy? no  Preferred Pharmacy (with phone number or street name): [id number] [address], [place] [phone] (Phone) [phone] (Fax)  Agent: Please be advised that RX refills may take up to 3 business days. We ask that you follow-up with your pharmacy.

## 2018-07-10 ENCOUNTER — Telehealth: Payer: Self-pay | Admitting: *Deleted

## 2018-07-10 ENCOUNTER — Encounter: Payer: Self-pay | Admitting: Student in an Organized Health Care Education/Training Program

## 2018-07-10 ENCOUNTER — Ambulatory Visit
Payer: Medicare Other | Attending: Student in an Organized Health Care Education/Training Program | Admitting: Student in an Organized Health Care Education/Training Program

## 2018-07-10 ENCOUNTER — Other Ambulatory Visit: Payer: Self-pay

## 2018-07-10 DIAGNOSIS — G894 Chronic pain syndrome: Secondary | ICD-10-CM

## 2018-07-10 DIAGNOSIS — M5136 Other intervertebral disc degeneration, lumbar region: Secondary | ICD-10-CM

## 2018-07-10 DIAGNOSIS — M533 Sacrococcygeal disorders, not elsewhere classified: Secondary | ICD-10-CM | POA: Diagnosis not present

## 2018-07-10 DIAGNOSIS — M47816 Spondylosis without myelopathy or radiculopathy, lumbar region: Secondary | ICD-10-CM

## 2018-07-10 DIAGNOSIS — G8929 Other chronic pain: Secondary | ICD-10-CM | POA: Diagnosis not present

## 2018-07-10 DIAGNOSIS — M51369 Other intervertebral disc degeneration, lumbar region without mention of lumbar back pain or lower extremity pain: Secondary | ICD-10-CM

## 2018-07-10 MED ORDER — HYDROCODONE-ACETAMINOPHEN 5-325 MG PO TABS: 1.0000 | ORAL_TABLET | Freq: Every day | ORAL | 0 refills | Status: AC | PRN

## 2018-07-10 NOTE — Progress Notes (Signed)
Pain Management Virtual Encounter Note - Virtual Visit via Telephone Telehealth (real-time audio visits between healthcare provider and patient).   Patient's Phone No. & Preferred Pharmacy:  [phone] (home); [phone] (mobile); (Preferred) [phone] No e-mail address on record  [id number] - [place], [address] [place] [address] Phone: [phone] Fax: [phone]    Pre-screening note:  Our staff contacted Jocelyn [name] and offered her an "in person", "face-to-face" appointment versus a telephone encounter. She indicated preferring the telephone encounter, at this time.   Reason for Virtual Visit: COVID-19*  Social distancing based on CDC and AMA recommendations.   I contacted Jocelyn [name] on 07/10/2018 via telephone.      I clearly identified myself as Gillis Santa, MD. I verified that I was speaking with the correct person using two identifiers (Name: Jocelyn [name], and date of birth: 1931/06/24).  Advanced Informed Consent I sought verbal advanced consent from Jocelyn [name] for virtual visit interactions. I informed Jocelyn [name] of possible security and privacy concerns, risks, and limitations associated with providing "not-in-person" medical evaluation and management services. I also informed Jocelyn [name] of the availability of "in-person" appointments. Finally, I informed her that there would be a charge for the virtual visit and that she could be  personally, fully or partially, financially responsible for it. Jocelyn [name] expressed understanding and agreed to proceed.   Historic Elements   Jocelyn [name] is a 85 y.o. year old, female patient evaluated today after her last encounter by our practice on 07/10/2018. Jocelyn [name]  has a past medical history of Arthritis of both knees (08/01/2015), Colitis, Essential hypertension, benign (07/28/2014), GERD (gastroesophageal reflux disease), iron deficiency anemia (09/02/2015), Hyperlipidemia,  Hypertension, Lumbar pain, Medicare annual wellness visit, subsequent (04/07/2017), OSA on CPAP (08/01/2015), Overweight (BMI 25.0-29.9) (07/10/2013), Prediabetes (09/02/2015), and SOB (shortness of breath) (07/10/2013). She also  has a past surgical history that includes Cataract extraction; Abdominal hysterectomy; Cholecystectomy; Lumbar disc surgery; and Colonoscopy with propofol (N/A, 04/09/2018). Jocelyn [name] has a current medication list which includes the following prescription(s): albuterol, aspirin ec, calcium-vitamin d, clonidine, diclofenac sodium, famotidine, ferrous sulfate, gabapentin, hydrocodone-acetaminophen, isosorbide mononitrate, centrum silver 50+women, olmesartan-amlodipine-hctz, potassium chloride, and pravastatin. She  reports that she has never smoked. She has never used smokeless tobacco. She reports that she does not drink alcohol or use drugs. Jocelyn [name] has No Known Allergies.   HPI  Today, she is being contacted for medication management.   No changes in medical history.  Pain well managed on current regimen.  Patient still has one prescription that she can pick up at the pharmacy of hydrocodone 5 mg, quantity 30.  Patient states that she is out of her medication and will go pick up her current prescription of hydrocodone soon.  I will provide her with a refill in 1 month.   Pharmacotherapy Assessment   05/23/2018  1   05/23/2018  Hydrocodone-Acetamin 5-325 MG  30.00 30 Bi Lat   [id number]   Nor (4618)   0  5.00 MME  Medicare   [place]     Monitoring: Pharmacotherapy: No side-effects or adverse reactions reported. [place] PMP: PDMP reviewed during this encounter.       Compliance: No problems identified. Effectiveness: Clinically acceptable. Plan: Refer to "POC".  Pertinent Labs   SAFETY SCREENING Profile No results found for: SARSCOV2NAA, COVIDSOURCE, STAPHAUREUS, MRSAPCR, HCVAB, HIV, PREGTESTUR Renal Function Lab Results  Component Value Date   BUN 16 02/07/2018  CREATININE 0.68  50/27/7412   BCR NOT APPLICABLE 87/86/7672   GFRAA 93 02/07/2018   GFRNONAA 80 02/07/2018   Hepatic Function Lab Results  Component Value Date   AST 14 02/07/2018   ALT 5 (L) 02/07/2018   ALBUMIN 4.1 07/07/2016   UDS Summary  Date Value Ref Range Status  03/06/2018 FINAL  Final    Comment:    ==================================================================== TOXASSURE COMP DRUG ANALYSIS,UR ==================================================================== Test                             Result       Flag       Units Drug Absent but Declared for Prescription Verification   Hydrocodone                    Not Detected UNEXPECTED ng/mg creat   Gabapentin                     Not Detected UNEXPECTED   Acetaminophen                  Not Detected UNEXPECTED    Acetaminophen, as indicated in the declared medication list, is    not always detected even when used as directed.   Diclofenac                     Not Detected UNEXPECTED    Topical diclofenac, as indicated in the declared medication list,    is not always detected even when used as directed.   Salicylate                     Not Detected UNEXPECTED    Aspirin, as indicated in the declared medication list, is not    always detected even when used as directed.   Clonidine                      Not Detected UNEXPECTED ==================================================================== Test                      Result    Flag   Units      Ref Range   Creatinine              41               mg/dL      >=20 ==================================================================== Declared Medications:  The flagging and interpretation on this report are based on the  following declared medications.  Unexpected results may arise from  inaccuracies in the declared medications.  **Note: The testing scope of this panel includes these medications:  Clonidine (Catapres)  Gabapentin (Neurontin)  Hydrocodone (Norco)  **Note: The  testing scope of this panel does not include small to  moderate amounts of these reported medications:  Acetaminophen (Norco)  Aspirin (Aspirin 81)  Topical Diclofenac (Voltaren)  **Note: The testing scope of this panel does not include following  reported medications:  Albuterol  Amlodipine  Calcium (Calcium/Vitamin D)  Famotidine (Pepcid)  Hydrochlorothiazide (HCTZ)  Iron (Ferrous Sulfate)  Isosorbide (Imdur)  Multivitamin (Centrum Silver)  Olmesartan  Potassium (K-Dur)  Pravastatin (Pravachol)  Supplement (Centrum Silver)  Vitamin D (Calcium/Vitamin D) ==================================================================== For clinical consultation, please call [phone]. ====================================================================    Note: Above Lab results reviewed.  Recent imaging  DG C-Arm 1-60 Min-No Report Fluoroscopy was utilized by the requesting physician.  No radiographic  interpretation.   Assessment  The primary encounter diagnosis was Lumbar facet arthropathy. Diagnoses of Lumbar degenerative disc disease, Chronic SI joint pain, and Chronic pain syndrome were also pertinent to this visit.  Plan of Care  I am having Jocelyn Lamere. [name] maintain her aspirin EC, Centrum Silver 50+Women, gabapentin, calcium-vitamin D, ferrous sulfate, diclofenac sodium, potassium chloride, isosorbide mononitrate, famotidine, albuterol, Olmesartan-amLODIPine-HCTZ, pravastatin, cloNIDine, and HYDROcodone-acetaminophen.  Pharmacotherapy (Medications Ordered): Meds ordered this encounter  Medications  . HYDROcodone-acetaminophen (NORCO/VICODIN) 5-325 MG tablet    Sig: Take 1 tablet by mouth daily as needed for up to 30 days for moderate pain or severe pain. Must last 30 days.    Dispense:  30 tablet    Refill:  0    [place] STOP ACT - Not applicable. Fill one day early if pharmacy is closed on scheduled refill date.   Orders:  No orders of the defined types were placed in  this encounter.  Follow-up plan:   Return in about 3 months (around 10/10/2018) for Medication Management.    I discussed the assessment and treatment plan with the patient. The patient was provided an opportunity to ask questions and all were answered. The patient agreed with the plan and demonstrated an understanding of the instructions.  Patient advised to call back or seek an in-person evaluation if the symptoms or condition worsens.  Total duration of non-face-to-face encounter: 25 minutes.  Note by: Gillis Santa, MD Date: 07/10/2018; Time: 2:16 PM  Note: This dictation was prepared with Dragon dictation. Any transcriptional errors that may result from this process are unintentional.  Disclaimer:  * Given the special circumstances of the COVID-19 pandemic, the federal government has announced that the Office for Civil Rights (OCR) will exercise its enforcement discretion and will not impose penalties on physicians using telehealth in the event of noncompliance with regulatory requirements under the [employer] and [place] (HIPAA) in connection with the good faith provision of telehealth during the JZPHX-50 national public health emergency. ([place])

## 2018-07-26 ENCOUNTER — Other Ambulatory Visit: Payer: Self-pay

## 2018-07-26 ENCOUNTER — Ambulatory Visit (INDEPENDENT_AMBULATORY_CARE_PROVIDER_SITE_OTHER): Payer: Medicare Other

## 2018-07-26 VITALS — Ht 63.0 in | Wt 155.0 lb

## 2018-07-26 DIAGNOSIS — Z0000 Encounter for general adult medical examination without abnormal findings: Secondary | ICD-10-CM

## 2018-07-26 NOTE — Progress Notes (Signed)
Subjective:   Jocelyn [name] is a 85 y.o. female who presents for Medicare Annual (Subsequent) preventive examination.  Virtual Visit via Telephone Note  I connected with Jocelyn [name] on 07/26/18 at 11:20 AM EDT by telephone and verified that I am speaking with the correct person using two identifiers.  Medicare Annual Wellness visit completed telephonically due to Covid-19 pandemic.   Location: Patient: home Provider: office   I discussed the limitations, risks, security and privacy concerns of performing an evaluation and management service by telephone and the availability of in person appointments. The patient expressed understanding and agreed to proceed.  Some vital signs may be absent or patient reported. Patient did not have thermometer or blood pressure monitor for at home reading.    [name], LPN     Review of Systems:   Cardiac Risk Factors include: advanced age (>43men, >54 women);hypertension;dyslipidemia;sedentary lifestyle     Objective:     Vitals: Ht 5\' 3"  (1.6 m)   Wt 155 lb (70.3 kg)   BMI 27.46 kg/m   Body mass index is 27.46 kg/m.  Advanced Directives 07/26/2018 04/09/2018 03/22/2018 03/06/2018 11/03/2016 07/07/2016 02/29/2016  Does Patient Have a Medical Advance Directive? No No No No No No No  Would patient like information on creating a medical advance directive? No - Patient declined No - Patient declined No - Patient declined No - Patient declined - - Yes (Inpatient - patient requests chaplain consult to create a medical advance directive);Yes (MAU/Ambulatory/Procedural Areas - Information given)    Tobacco Social History   Tobacco Use  Smoking Status Never Smoker  Smokeless Tobacco Never Used     Counseling given: Not Answered   Clinical Intake:  Pre-visit preparation completed: Yes  Pain : No/denies pain     BMI - recorded: 27.46 Nutritional Status: BMI 25 -29 Overweight Nutritional Risks: None Diabetes: No  How often do  you need to have someone help you when you read instructions, pamphlets, or other written materials from your doctor or pharmacy?: 1 - Never  Interpreter Needed?: No  Information entered by :: [name] LPN  Past Medical History:  Diagnosis Date  . Arthritis of both knees 08/01/2015  . Colitis   . Essential hypertension, benign 07/28/2014  . GERD (gastroesophageal reflux disease)   . Hx of iron deficiency anemia 09/02/2015  . Hyperlipidemia   . Hypertension   . Lumbar pain   . Medicare annual wellness visit, subsequent 04/07/2017  . OSA on CPAP 08/01/2015  . Overweight (BMI 25.0-29.9) 07/10/2013  . Prediabetes 09/02/2015  . SOB (shortness of breath) 07/10/2013   Past Surgical History:  Procedure Laterality Date  . ABDOMINAL HYSTERECTOMY    . CATARACT EXTRACTION    . CHOLECYSTECTOMY    . COLONOSCOPY WITH PROPOFOL N/A 04/09/2018   Procedure: COLONOSCOPY WITH PROPOFOL;  Surgeon: Lin Landsman, MD;  Location: [facility] ENDOSCOPY;  Service: Gastroenterology;  Laterality: N/A;  . LUMBAR DISC SURGERY     Family History  Problem Relation Age of Onset  . Hypertension Daughter   . Diabetes Daughter   . Cancer Daughter        breast  . Diabetes Mother   . Heart disease Mother   . Cancer Father        lung; smoker  . Anuerysm Son    Social History   Socioeconomic History  . Marital status: Widowed    Spouse name: Not on file  . Number of children: 5  . Years  of education: Not on file  . Highest education level: Not on file  Occupational History  . Not on file  Social Needs  . Financial resource strain: Somewhat hard  . Food insecurity    Worry: Sometimes true    Inability: Never true  . Transportation needs    Medical: No    Non-medical: No  Tobacco Use  . Smoking status: Never Smoker  . Smokeless tobacco: Never Used  Substance and Sexual Activity  . Alcohol use: No    Alcohol/week: 0.0 standard drinks  . Drug use: No  . Sexual activity: Not Currently  Lifestyle  .  Physical activity    Days per week: 0 days    Minutes per session: 0 min  . Stress: Not at all  Relationships  . Social connections    Talks on phone: More than three times a week    Gets together: Three times a week    Attends religious service: More than 4 times per year    Active member of club or organization: No    Attends meetings of clubs or organizations: Never    Relationship status: Widowed  Other Topics Concern  . Not on file  Social History Narrative  . Not on file    Outpatient Encounter Medications as of 07/26/2018  Medication Sig  . albuterol (VENTOLIN HFA) 108 (90 Base) MCG/ACT inhaler Inhale 2 puffs into the lungs every 6 (six) hours as needed for wheezing or shortness of breath.  Marland Kitchen aspirin EC 81 MG tablet Take 1 tablet (81 mg total) by mouth daily.  . calcium-vitamin D 250-100 MG-UNIT tablet Take 1 tablet by mouth 2 (two) times daily.  . cloNIDine (CATAPRES) 0.1 MG tablet Take 1 tablet (0.1 mg total) by mouth every 8 (eight) hours. (Patient taking differently: Take 0.1 mg by mouth every 8 (eight) hours. Pt taking BID)  . diclofenac sodium (VOLTAREN) 1 % GEL Apply 4 g topically 4 (four) times daily as needed. On the knees  . famotidine (PEPCID) 20 MG tablet TAKE 1 TABLET (20 MG TOTAL) BY MOUTH 2 (TWO) TIMES DAILY. IF NEEDED FOR HEARTBURN DO NOT TAKE RANITIDINE  . ferrous sulfate 325 (65 FE) MG tablet Take 325 mg by mouth daily.  Derrill Memo ON 08/13/2018] HYDROcodone-acetaminophen (NORCO/VICODIN) 5-325 MG tablet Take 1 tablet by mouth daily as needed for up to 30 days for moderate pain or severe pain. Must last 30 days.  . isosorbide mononitrate (IMDUR) 120 MG 24 hr tablet TAKE 1 TABLET BY MOUTH EVERY DAY  . Multiple Vitamins-Minerals (CENTRUM SILVER 50+WOMEN) TABS Take 1 tablet by mouth daily.  . Olmesartan-amLODIPine-HCTZ 40-10-12.5 MG TABS TAKE 1 TABLET BY MOUTH DAILY. FOR BLOOD PRESSURE  . pravastatin (PRAVACHOL) 20 MG tablet TAKE ONE PILL BY MOUTH TWO NIGHTS EACH WEEK  [facility] AND SUNDAYS, FOR EXAMPLE)  . potassium chloride (KLOR-CON 10) 10 MEQ tablet Take 1 tablet (10 mEq total) by mouth 3 (three) times a week. (Patient not taking: Reported on 07/26/2018)  . [DISCONTINUED] gabapentin (NEURONTIN) 100 MG capsule Take 100 mg by mouth daily.   No facility-administered encounter medications on file as of 07/26/2018.     Activities of Daily Living In your present state of health, do you have any difficulty performing the following activities: 07/26/2018 05/24/2018  Hearing? Y N  Vision? Y Y  Difficulty concentrating or making decisions? N N  Walking or climbing stairs? Y N  Dressing or bathing? N N  Doing errands, shopping? N N  Preparing Food and eating ? N -  Using the Toilet? N -  In the past six months, have you accidently leaked urine? Y -  Comment wears pads for protection -  Do you have problems with loss of bowel control? N -  Managing your Medications? N -  Managing your Finances? N -  Housekeeping or managing your Housekeeping? N -  Some recent data might be hidden    Patient Care Team: Lada, Satira Anis, MD as PCP - General (Family Medicine) Yolonda Kida, MD as Consulting Physician (Cardiology) Lovell Sheehan, MD as Consulting Physician (Orthopedic Surgery) Laverle Hobby, MD as Consulting Physician (Pulmonary Disease) Thornton Park, MD as Referring Physician (Orthopedic Surgery) [name], MD as Referring Physician (Otolaryngology)    Assessment:   This is a routine wellness examination for Elda.  Exercise Activities and Dietary recommendations Current Exercise Habits: The patient does not participate in regular exercise at present, Exercise limited by: orthopedic condition(s)  Goals    . Patient Stated (pt-stated)     Puzzles and mind exercises       Fall Risk Fall Risk  07/26/2018 05/24/2018 04/18/2018 03/22/2018 03/06/2018  Falls in the past year? 1 1 0 0 0  Number falls in past yr: 1 0 - - -  Injury with  Fall? 1 1 - - -  Risk for fall due to : Impaired balance/gait;Impaired mobility - - - -   FALL RISK PREVENTION PERTAINING TO THE HOME:  Any stairs in or around the home? Yes  If so, do they handrails? Yes   Home free of loose throw rugs in walkways, pet beds, electrical cords, etc? Yes  Adequate lighting in your home to reduce risk of falls? Yes   ASSISTIVE DEVICES UTILIZED TO PREVENT FALLS:  Life alert? Yes  Use of a cane, walker or w/c? Yes  Grab bars in the bathroom? Yes  Shower chair or bench in shower? Yes  Elevated toilet seat or a handicapped toilet? Yes   DME ORDERS:  DME order needed?  No   TIMED UP AND GO:  Was the test performed? No . Telephonic visit  Education: Fall risk prevention has been discussed.  Intervention(s) required? No   Depression Screen PHQ 2/9 Scores 07/26/2018 04/18/2018 03/22/2018 03/06/2018  PHQ - 2 Score 0 0 0 0  PHQ- 9 Score - - - -     Cognitive Function     6CIT Screen 04/07/2017  What Year? 0 points  What month? 0 points  What time? 0 points  Count back from 20 0 points  Months in reverse 0 points  Repeat phrase 0 points  Total Score 0    Immunization History  Administered Date(s) Administered  . Influenza Split 11/01/2006, 11/14/2007  . Influenza, High Dose Seasonal PF 10/08/2014, 02/29/2016, 11/03/2016, 11/28/2017  . Influenza, Seasonal, Injecte, Preservative Fre 10/12/2010, 11/09/2011  . Influenza,inj,Quad PF,6+ Mos 11/26/2012, 11/20/2013  . Influenza-Unspecified 10/31/2012  . Pneumococcal Conjugate-13 02/29/2016  . Pneumococcal Polysaccharide-23 11/09/2011  . Tdap 07/09/2010  . Zoster 11/09/2011    Qualifies for Shingles Vaccine? Yes  Zostavax completed 2013. Due for Shingrix. Education has been provided regarding the importance of this vaccine. Pt has been advised to call insurance company to determine out of pocket expense. Advised may also receive vaccine at local pharmacy or Health Dept. Verbalized acceptance and  understanding.  Tdap: Up to date   Flu Vaccine: Up to date   Pneumococcal Vaccine: Up to date   Screening Tests  Health Maintenance  Topic Date Due  . DEXA SCAN  January 25, 1932  . INFLUENZA VACCINE  09/01/2018  . TETANUS/TDAP  07/08/2020  . PNA vac Low Risk Adult  Completed    Cancer Screenings:  Colorectal Screening: Completed 04/09/18. No longer required.   Mammogram:  No longer required.   Bone Density:  Ordered 05/24/18. Pt provided with contact information and advised to call to schedule appt.   Lung Cancer Screening: (Low Dose CT Chest recommended if Age 57-80 years, 30 pack-year currently smoking OR have quit w/in 15years.) does not qualify.    Additional Screening:  Hepatitis C Screening: no longer required  Vision Screening: Recommended annual ophthalmology exams for early detection of glaucoma and other disorders of the eye. Is the patient up to date with their annual eye exam?  No  Who is the provider or what is the name of the office in which the pt attends annual eye exams? [place] Screening: Recommended annual dental exams for proper oral hygiene  Community Resource Referral:  CRR required this visit?  No      Plan:     I have personally reviewed and addressed the Medicare Annual Wellness questionnaire and have noted the following in the patient's chart:  A. Medical and social history B. Use of alcohol, tobacco or illicit drugs  C. Current medications and supplements D. Functional ability and status E.  Nutritional status F.  Physical activity G. Advance directives H. List of other physicians I.  Hospitalizations, surgeries, and ER visits in previous 12 months J.  [place] such as hearing and vision if needed, cognitive and depression L. Referrals and appointments   In addition, I have reviewed and discussed with patient certain preventive protocols, quality metrics, and best practice recommendations. A written  personalized care plan for preventive services as well as general preventive health recommendations were provided to patient.   Signed,  [name], LPN Nurse Health Advisor   Nurse Notes: pt doing well. She is concerned about her blood pressure medication and the dosage. Advised pt to schedule appt with Raelyn Ensign FNP for follow up. Pt requested me to contact her daughter Roland Rack regarding scheduling an appointment. Left msg to call back and update future appt.

## 2018-07-26 NOTE — Patient Instructions (Signed)
Jocelyn [name] , Thank you for taking time to come for your Medicare Wellness Visit. I appreciate your ongoing commitment to your health goals. Please review the following plan we discussed and let me know if I can assist you in the future.   Screening recommendations/referrals: Colonoscopy: done 04/09/18. No longer required.  Mammogram: no longer required Bone Density: Please call [phone] to schedule your bone density screening.  Recommended yearly ophthalmology/optometry visit for glaucoma screening and checkup Recommended yearly dental visit for hygiene and checkup  Vaccinations: Influenza vaccine: done 11/28/17  Pneumococcal vaccine: done 02/29/16 Tdap vaccine: done 07/09/10 Shingles vaccine: Shingrix discussed. Please contact your pharmacy for coverage information.   Conditions/risks identified: recommend preventing falls by improving gait and stability with assistive devices  Next appointment: Please follow up in one year for your Medicare Annual Wellness visit.     Preventive Care 24 Years and Older, Female Preventive care refers to lifestyle choices and visits with your health care provider that can promote health and wellness. What does preventive care include?  A yearly physical exam. This is also called an annual well check.  Dental exams once or twice a year.  Routine eye exams. Ask your health care provider how often you should have your eyes checked.  Personal lifestyle choices, including:  Daily care of your teeth and gums.  Regular physical activity.  Eating a healthy diet.  Avoiding tobacco and drug use.  Limiting alcohol use.  Practicing safe sex.  Taking low-dose aspirin every day.  Taking vitamin and mineral supplements as recommended by your health care provider. What happens during an annual well check? The services and screenings done by your health care provider during your annual well check will depend on your age, overall health, lifestyle risk  factors, and family history of disease. Counseling  Your health care provider may ask you questions about your:  Alcohol use.  Tobacco use.  Drug use.  Emotional well-being.  Home and relationship well-being.  Sexual activity.  Eating habits.  History of falls.  Memory and ability to understand (cognition).  Work and work Statistician.  Reproductive health. Screening  You may have the following tests or measurements:  Height, weight, and BMI.  Blood pressure.  Lipid and cholesterol levels. These may be checked every 5 years, or more frequently if you are over 84 years old.  Skin check.  Lung cancer screening. You may have this screening every year starting at age 19 if you have a 30-pack-year history of smoking and currently smoke or have quit within the past 15 years.  Fecal occult blood test (FOBT) of the stool. You may have this test every year starting at age 19.  Flexible sigmoidoscopy or colonoscopy. You may have a sigmoidoscopy every 5 years or a colonoscopy every 10 years starting at age 27.  Hepatitis C blood test.  Hepatitis B blood test.  Sexually transmitted disease (STD) testing.  Diabetes screening. This is done by checking your blood sugar (glucose) after you have not eaten for a while (fasting). You may have this done every 1-3 years.  Bone density scan. This is done to screen for osteoporosis. You may have this done starting at age 10.  Mammogram. This may be done every 1-2 years. Talk to your health care provider about how often you should have regular mammograms. Talk with your health care provider about your test results, treatment options, and if necessary, the need for more tests. Vaccines  Your health care provider may recommend certain  vaccines, such as:  Influenza vaccine. This is recommended every year.  Tetanus, diphtheria, and acellular pertussis (Tdap, Td) vaccine. You may need a Td booster every 10 years.  Zoster vaccine. You  may need this after age 64.  Pneumococcal 13-valent conjugate (PCV13) vaccine. One dose is recommended after age 59.  Pneumococcal polysaccharide (PPSV23) vaccine. One dose is recommended after age 57. Talk to your health care provider about which screenings and vaccines you need and how often you need them. This information is not intended to replace advice given to you by your health care provider. Make sure you discuss any questions you have with your health care provider. Document Released: 02/13/2015 Document Revised: 10/07/2015 Document Reviewed: 11/18/2014 Elsevier Interactive Patient Education  2017 [place] Prevention in the Home Falls can cause injuries. They can happen to people of all ages. There are many things you can do to make your home safe and to help prevent falls. What can I do on the outside of my home?  Regularly fix the edges of walkways and driveways and fix any cracks.  Remove anything that might make you trip as you walk through a door, such as a raised step or threshold.  Trim any bushes or trees on the path to your home.  Use bright outdoor lighting.  Clear any walking paths of anything that might make someone trip, such as rocks or tools.  Regularly check to see if handrails are loose or broken. Make sure that both sides of any steps have handrails.  Any raised decks and porches should have guardrails on the edges.  Have any leaves, snow, or ice cleared regularly.  Use sand or salt on walking paths during winter.  Clean up any spills in your garage right away. This includes oil or grease spills. What can I do in the bathroom?  Use night lights.  Install grab bars by the toilet and in the tub and shower. Do not use towel bars as grab bars.  Use non-skid mats or decals in the tub or shower.  If you need to sit down in the shower, use a plastic, non-slip stool.  Keep the floor dry. Clean up any water that spills on the floor as soon as  it happens.  Remove soap buildup in the tub or shower regularly.  Attach bath mats securely with double-sided non-slip rug tape.  Do not have throw rugs and other things on the floor that can make you trip. What can I do in the bedroom?  Use night lights.  Make sure that you have a light by your bed that is easy to reach.  Do not use any sheets or blankets that are too big for your bed. They should not hang down onto the floor.  Have a firm chair that has side arms. You can use this for support while you get dressed.  Do not have throw rugs and other things on the floor that can make you trip. What can I do in the kitchen?  Clean up any spills right away.  Avoid walking on wet floors.  Keep items that you use a lot in easy-to-reach places.  If you need to reach something above you, use a strong step stool that has a grab bar.  Keep electrical cords out of the way.  Do not use floor polish or wax that makes floors slippery. If you must use wax, use non-skid floor wax.  Do not have throw rugs and other things  on the floor that can make you trip. What can I do with my stairs?  Do not leave any items on the stairs.  Make sure that there are handrails on both sides of the stairs and use them. Fix handrails that are broken or loose. Make sure that handrails are as long as the stairways.  Check any carpeting to make sure that it is firmly attached to the stairs. Fix any carpet that is loose or worn.  Avoid having throw rugs at the top or bottom of the stairs. If you do have throw rugs, attach them to the floor with carpet tape.  Make sure that you have a light switch at the top of the stairs and the bottom of the stairs. If you do not have them, ask someone to add them for you. What else can I do to help prevent falls?  Wear shoes that:  Do not have high heels.  Have rubber bottoms.  Are comfortable and fit you well.  Are closed at the toe. Do not wear sandals.  If  you use a stepladder:  Make sure that it is fully opened. Do not climb a closed stepladder.  Make sure that both sides of the stepladder are locked into place.  Ask someone to hold it for you, if possible.  Clearly mark and make sure that you can see:  Any grab bars or handrails.  First and last steps.  Where the edge of each step is.  Use tools that help you move around (mobility aids) if they are needed. These include:  Canes.  Walkers.  Scooters.  Crutches.  Turn on the lights when you go into a dark area. Replace any light bulbs as soon as they burn out.  Set up your furniture so you have a clear path. Avoid moving your furniture around.  If any of your floors are uneven, fix them.  If there are any pets around you, be aware of where they are.  Review your medicines with your doctor. Some medicines can make you feel dizzy. This can increase your chance of falling. Ask your doctor what other things that you can do to help prevent falls. This information is not intended to replace advice given to you by your health care provider. Make sure you discuss any questions you have with your health care provider. Document Released: 11/13/2008 Document Revised: 06/25/2015 Document Reviewed: 02/21/2014 Elsevier Interactive Patient Education  2017 [employer].

## 2018-07-27 ENCOUNTER — Other Ambulatory Visit: Payer: Self-pay

## 2018-07-27 NOTE — Patient Outreach (Signed)
[facility]) Care Management  07/27/2018  NIASIA LANPHEAR [birth year] [id number]   Medication Adherence call to Mrs. Orlando Penner patient did not answer patient is past due on Olmesartan/Amlodipine/Hctz. Mrs. Daddona is showing past due under [place].   [facility] Management Direct Dial [phone]  Fax [phone] [email or web address]

## 2018-08-01 ENCOUNTER — Ambulatory Visit (INDEPENDENT_AMBULATORY_CARE_PROVIDER_SITE_OTHER): Payer: Medicare Other | Admitting: Nurse Practitioner

## 2018-08-01 ENCOUNTER — Encounter: Payer: Self-pay | Admitting: Nurse Practitioner

## 2018-08-01 ENCOUNTER — Other Ambulatory Visit: Payer: Self-pay

## 2018-08-01 VITALS — BP 132/68 | HR 99 | Temp 97.5°F | Resp 14 | Ht 63.0 in | Wt 158.1 lb

## 2018-08-01 DIAGNOSIS — I209 Angina pectoris, unspecified: Secondary | ICD-10-CM | POA: Diagnosis not present

## 2018-08-01 DIAGNOSIS — G4733 Obstructive sleep apnea (adult) (pediatric): Secondary | ICD-10-CM

## 2018-08-01 DIAGNOSIS — Z9989 Dependence on other enabling machines and devices: Secondary | ICD-10-CM

## 2018-08-01 DIAGNOSIS — Z862 Personal history of diseases of the blood and blood-forming organs and certain disorders involving the immune mechanism: Secondary | ICD-10-CM

## 2018-08-01 DIAGNOSIS — Z5181 Encounter for therapeutic drug level monitoring: Secondary | ICD-10-CM | POA: Diagnosis not present

## 2018-08-01 DIAGNOSIS — I10 Essential (primary) hypertension: Secondary | ICD-10-CM | POA: Diagnosis not present

## 2018-08-01 DIAGNOSIS — K219 Gastro-esophageal reflux disease without esophagitis: Secondary | ICD-10-CM

## 2018-08-01 DIAGNOSIS — M153 Secondary multiple arthritis: Secondary | ICD-10-CM

## 2018-08-01 DIAGNOSIS — E782 Mixed hyperlipidemia: Secondary | ICD-10-CM

## 2018-08-01 MED ORDER — DICLOFENAC SODIUM 1 % TD GEL: 4.0000 g | Freq: Four times a day (QID) | TRANSDERMAL | 5 refills | Status: DC | PRN

## 2018-08-01 MED ORDER — OLMESARTAN-AMLODIPINE-HCTZ 40-10-12.5 MG PO TABS: 1.0000 | ORAL_TABLET | Freq: Every day | ORAL | 1 refills | Status: DC

## 2018-08-01 NOTE — Patient Instructions (Signed)

## 2018-08-01 NOTE — Progress Notes (Signed)
Name: Jocelyn [name]   MRN: [id number]    DOB: January 10, 1932   Date:08/01/2018       Progress Note  Subjective  Chief Complaint  Chief Complaint  Patient presents with  . Medication Refill    HPI  Hypertension Patient is on olmesartan 40mg , amlodipine 10mg  and HCTZ12.5mg  daily,imdur 120mg  daily and clonidine 0.1mg  BID. Has a history of angina- none in the last year or so.  Takes medications as prescribed with no missed doses a month.  She is relatively compliant with low-salt diet, no added salt Denies chest pain, headaches, blurry visio, dizziness.  Hyperlipidemia Patient rx pravastatin 20mg  twice a week.Takes medications as prescribed with possible missed doses. Does eat a good amount of fried foods, cut back on red meats.  Denies myalgias   Lab Results  Component Value Date   CHOL 208 (H) 12/08/2017   HDL 54 12/08/2017   LDLCALC 132 (H) 12/08/2017   TRIG 116 12/08/2017   CHOLHDL 3.9 12/08/2017   GERD Takes pepcid BID, states this is well controlled.  Denies any food triggers that she can recognize.  Osteoarthritis Follows up with Dr. Holley Raring- got a joint injection previously that helped, taked vicodin PRN. Uses diclofenac gel on her knees.   OSA Self reported cpap compliance: 100%  Iron deficiency anemia.  She is on iron tablets to take due to stable anemia in the past.  States she no longer is taking iron tablets.  Denies fatigue, shortness of breath.  She had colonoscopy in March of this year-showing nonbleeding hemorrhoids and possible healing colitis patient denies dark tarry stools or blood or diarrhea PHQ2/9: Depression screen [facility] 2/9 08/01/2018 07/26/2018 04/18/2018 03/22/2018 03/06/2018  Decreased Interest 0 0 0 0 0  Down, Depressed, Hopeless 0 0 0 0 0  PHQ - 2 Score 0 0 0 0 0  Altered sleeping 0 - - - -  Tired, decreased energy 0 - - - -  Change in appetite 0 - - - -  Feeling bad or failure about yourself  0 - - - -  Trouble concentrating 0 - - - -  Moving slowly  or fidgety/restless 0 - - - -  Suicidal thoughts 0 - - - -  PHQ-9 Score 0 - - - -  Difficult doing work/chores Not difficult at all - - - -  Some recent data might be hidden     PHQ reviewed. Negative  Patient Active Problem List   Diagnosis Date Noted  . SI joint arthritis 03/22/2018  . Chronic pain of both knees 03/22/2018  . Chronic pain syndrome 03/22/2018  . Right sided colitis 12/30/2017  . Impaired fasting glucose 02/06/2017  . Carpal tunnel syndrome on right 09/05/2015  . Gastroesophageal reflux disease without esophagitis 09/05/2015  . Hx of iron deficiency anemia 09/02/2015  . OSA on CPAP 08/01/2015  . Arthritis of both knees 08/01/2015  . Controlled substance agreement signed 07/08/2015  . Essential hypertension, benign 07/28/2014  . Lumbar degenerative disc disease 07/28/2014  . 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 Angina pectoris ([place]) 07/10/2013  . HLD (hyperlipidemia) 07/10/2013  . Cerebrovascular accident (CVA) ([place]) 07/10/2013  . Hearing loss 01/29/2009  . Arthritis sicca 05/23/2006    Past Medical History:  Diagnosis Date  . Arthritis of both knees 08/01/2015  . Colitis   . Essential hypertension, benign 07/28/2014  . GERD (gastroesophageal reflux disease)   . Hx of iron deficiency anemia 09/02/2015  . Hyperlipidemia   . Hypertension   .  Lumbar pain   . Medicare annual wellness visit, subsequent 04/07/2017  . OSA on CPAP 08/01/2015  . Overweight (BMI 25.0-29.9) 07/10/2013  . Prediabetes 09/02/2015  . SOB (shortness of breath) 07/10/2013    Past Surgical History:  Procedure Laterality Date  . ABDOMINAL HYSTERECTOMY    . CATARACT EXTRACTION    . CHOLECYSTECTOMY    . COLONOSCOPY WITH PROPOFOL N/A 04/09/2018   Procedure: COLONOSCOPY WITH PROPOFOL;  Surgeon: Lin Landsman, MD;  [facility] ENDOSCOPY;  Service: Gastroenterology;  Laterality: N/A;  . LUMBAR DISC SURGERY      Social History   Tobacco Use  . Smoking status: Never Smoker  . Smokeless  tobacco: Never Used  Substance Use Topics  . Alcohol use: No    Alcohol/week: 0.0 standard drinks     Current Outpatient Medications:  .  albuterol (VENTOLIN HFA) 108 (90 Base) MCG/ACT inhaler, Inhale 2 puffs into the lungs every 6 (six) hours as needed for wheezing or shortness of breath., Disp: 1 Inhaler, Rfl: 1 .  aspirin EC 81 MG tablet, Take 1 tablet (81 mg total) by mouth daily., Disp: 30 tablet, Rfl: 12 .  calcium-vitamin D 250-100 MG-UNIT tablet, Take 1 tablet by mouth 2 (two) times daily., Disp: , Rfl:  .  cloNIDine (CATAPRES) 0.1 MG tablet, Take 1 tablet (0.1 mg total) by mouth every 8 (eight) hours. (Patient taking differently: Take 0.1 mg by mouth every 8 (eight) hours. Pt taking BID), Disp: 270 tablet, Rfl: 1 .  diclofenac sodium (VOLTAREN) 1 % GEL, Apply 4 g topically 4 (four) times daily as needed. On the knees, Disp: 100 g, Rfl: 5 .  famotidine (PEPCID) 20 MG tablet, TAKE 1 TABLET (20 MG TOTAL) BY MOUTH 2 (TWO) TIMES DAILY. IF NEEDED FOR HEARTBURN DO NOT TAKE RANITIDINE, Disp: 180 tablet, Rfl: 0 .  ferrous sulfate 325 (65 FE) MG tablet, Take 325 mg by mouth daily., Disp: , Rfl:  .  [START ON 08/13/2018] HYDROcodone-acetaminophen (NORCO/VICODIN) 5-325 MG tablet, Take 1 tablet by mouth daily as needed for up to 30 days for moderate pain or severe pain. Must last 30 days., Disp: 30 tablet, Rfl: 0 .  isosorbide mononitrate (IMDUR) 120 MG 24 hr tablet, TAKE 1 TABLET BY MOUTH EVERY DAY, Disp: 90 tablet, Rfl: 3 .  Multiple Vitamins-Minerals (CENTRUM SILVER 50+WOMEN) TABS, Take 1 tablet by mouth daily., Disp: , Rfl:  .  Olmesartan-amLODIPine-HCTZ 40-10-12.5 MG TABS, TAKE 1 TABLET BY MOUTH DAILY. FOR BLOOD PRESSURE, Disp: 90 tablet, Rfl: 1 .  pravastatin (PRAVACHOL) 20 MG tablet, TAKE ONE PILL BY MOUTH TWO NIGHTS EACH WEEK [facility] AND SUNDAYS, FOR EXAMPLE), Disp: 26 tablet, Rfl: 1  No Known Allergies  ROS    No other specific complaints in a complete review of systems (except as  listed in HPI above).  Objective  Vitals:   08/01/18 1520  BP: 132/68  Pulse: 99  Resp: 14  Temp: (!) 97.5 F (36.4 C)  TempSrc: Tympanic  SpO2: 95%  Weight: 158 lb 1.6 oz (71.7 kg)  Height: 5\' 3"  (1.6 m)     Body mass index is 28.01 kg/m.  Nursing Note and Vital Signs reviewed.  Physical Exam  Physical Exam   Constitutional: Patient appears well-developed and well-nourished. Obese. No distress.  HEENT: head atraumatic, normocephalic, conjunctive clear Cardiovascular: Normal rate,  No BLE edema. Pulmonary/Chest: Effort normall. No respiratory distress. Skin: no concerning rashes or bruising  Psychiatric: Patient has a normal mood and affect. behavior is normal. Judgment  and thought content normal.   Hands off exam due to pandemic and no acute concerns.     No results found for this or any previous visit (from the past 48 hour(s)).  Assessment & Plan  1. Essential hypertension, benign Stable continue medication management discussed [place] guidelines - Olmesartan-amLODIPine-HCTZ 40-10-12.5 MG TABS; Take 1 tablet by mouth daily. For blood pressure  Dispense: 90 tablet; Refill: 1 - COMPLETE METABOLIC PANEL WITH GFR  2. Angina pectoris ([place]) Asymptomatic continue Imdur, follow-up with cardiology to develop symptoms  3. OSA on CPAP CPAP - CBC  4. Gastroesophageal reflux disease without esophagitis Stable continue H2 blocker  5. Mixed hyperlipidemia Continue pravastatin discussed diet - Lipid Profile  6. Secondary osteoarthritis of multiple sites Follow-up with pain management - diclofenac sodium (VOLTAREN) 1 % GEL; Apply 4 g topically 4 (four) times daily as needed. On the knees  Dispense: 100 g; Refill: 5  7. Hx of iron deficiency anemia - CBC  8. Medication monitoring encounter - COMPLETE METABOLIC PANEL WITH GFR     -Red flags and when to present for emergency care or RTC including fever >101.30F, chest pain, shortness of breath,  new/worsening/un-resolving symptoms,  reviewed with patient at time of visit. Follow up and care instructions discussed and provided in AVS.

## 2018-08-02 LAB — COMPLETE METABOLIC PANEL WITH GFR
AG Ratio: 1.3 (calc) (ref 1.0–2.5)
ALT: 11 U/L (ref 6–29)
AST: 19 U/L (ref 10–35)
Albumin: 4.3 g/dL (ref 3.6–5.1)
Alkaline phosphatase (APISO): 49 U/L (ref 37–153)
BUN: 14 mg/dL (ref 7–25)
CO2: 28 mmol/L (ref 20–32)
Calcium: 10 mg/dL (ref 8.6–10.4)
Chloride: 103 mmol/L (ref 98–110)
Creat: 0.8 mg/dL (ref 0.60–0.88)
GFR, Est African American: 78 mL/min/{1.73_m2} (ref >=60)
GFR, Est Non African American: 67 mL/min/{1.73_m2} (ref >=60)
Globulin: 3.2 g/dL (calc) (ref 1.9–3.7)
Glucose, Bld: 87 mg/dL (ref 65–99)
Potassium: 3.9 mmol/L (ref 3.5–5.3)
Sodium: 140 mmol/L (ref 135–146)
Total Bilirubin: 0.4 mg/dL (ref 0.2–1.2)
Total Protein: 7.5 g/dL (ref 6.1–8.1)

## 2018-08-02 LAB — CBC
HCT: 35.1 % (ref 35.0–45.0)
Hemoglobin: 11.5 g/dL — ABNORMAL LOW (ref 11.7–15.5)
MCH: 27.6 pg (ref 27.0–33.0)
MCHC: 32.8 g/dL (ref 32.0–36.0)
MCV: 84.2 fL (ref 80.0–100.0)
MPV: 11.9 fL (ref 7.5–12.5)
Platelets: 192 10*3/uL (ref 140–400)
RBC: 4.17 10*6/uL (ref 3.80–5.10)
RDW: 12.6 % (ref 11.0–15.0)
WBC: 8.6 10*3/uL (ref 3.8–10.8)

## 2018-08-02 LAB — LIPID PANEL
Cholesterol: 203 mg/dL — ABNORMAL HIGH (ref <200)
HDL: 65 mg/dL (ref >=50)
LDL Cholesterol (Calc): 120 mg/dL (calc) — ABNORMAL HIGH
Non-HDL Cholesterol (Calc): 138 mg/dL (calc) — ABNORMAL HIGH (ref <130)
Total CHOL/HDL Ratio: 3.1 (calc) (ref <5.0)
Triglycerides: 84 mg/dL (ref <150)

## 2018-08-14 ENCOUNTER — Telehealth: Payer: Self-pay

## 2018-08-14 NOTE — Telephone Encounter (Signed)
Copied from [place] [id number]. Topic: General - Other >> Aug 14, 2018  1:38 PM Leward Quan A wrote: Reason for CRM: Patient called to get the result of blood work that was done last week asking for a call back please Ph# [phone]

## 2018-08-15 NOTE — Telephone Encounter (Signed)
Patient's daughter notified.

## 2018-08-23 ENCOUNTER — Ambulatory Visit: Payer: Medicare Other | Admitting: Family Medicine

## 2018-09-12 ENCOUNTER — Telehealth: Payer: Self-pay | Admitting: Family Medicine

## 2018-09-12 NOTE — Chronic Care Management (AMB) (Signed)
°  Chronic Care Management   Outreach Note  09/12/2018 Name: Jocelyn [name] MRN: [id number] DOB: 12-08-1931  Referred by: Arnetha Courser, MD Reason for referral : Chronic Care Management (Initial CCM outreach was unsuccessful. )   An unsuccessful telephone outreach was attempted today. The patient was referred to the case management team by for assistance with chronic care management and care coordination.   Follow Up Plan: A HIPPA compliant phone message was left for the patient providing contact information and requesting a return call.  The care management team will reach out to the patient again over the next 7 days.  If patient returns call to provider office, please advise to call [place] at [place]  [email or web address]   ??[phone]

## 2018-09-18 NOTE — Chronic Care Management (AMB) (Signed)
Chronic Care Management   Note  09/18/2018 Name: SUMAIYA ARRUDA MRN: [id number] DOB: [birth year]  SOLANGE EMRY is a 85 y.o. year old female who is a primary care patient of Lada, Satira Anis, MD. I reached out to [name] by phone today in response to a referral sent by Ms. Salome Arnt Hazelett's health plan.    Ms. Picking was given information about Chronic Care Management services today including:  1. CCM service includes personalized support from designated clinical staff supervised by her physician, including individualized plan of care and coordination with other care providers 2. 24/7 contact phone numbers for assistance for urgent and routine care needs. 3. Service will only be billed when office clinical staff spend 20 minutes or more in a month to coordinate care. 4. Only one practitioner may furnish and bill the service in a calendar month. 5. The patient may stop CCM services at any time (effective at the end of the month) by phone call to the office staff. 6. The patient will be responsible for cost sharing (co-pay) of up to 20% of the service fee (after annual deductible is met).  Patients daughter Roland Rack agreed to services and verbal consent obtained.   Follow up plan: Telephone appointment with CCM team member scheduled for: 09/19/2018  [place]  [email or web address]   ??[phone]

## 2018-09-19 ENCOUNTER — Ambulatory Visit (INDEPENDENT_AMBULATORY_CARE_PROVIDER_SITE_OTHER): Payer: Medicare Other | Admitting: Pharmacist

## 2018-09-19 DIAGNOSIS — I209 Angina pectoris, unspecified: Secondary | ICD-10-CM

## 2018-09-19 DIAGNOSIS — I10 Essential (primary) hypertension: Secondary | ICD-10-CM | POA: Diagnosis not present

## 2018-09-19 NOTE — Patient Instructions (Signed)
  Thank you allowing the Chronic Care Management Team to be a part of your care!   Please call a member of the CCM (Chronic Care Management) Team with any questions or case management needs:   Vanetta Mulders, BSN Nurse Care Coordinator  [phone]  Ruben Reason, PharmD  Clinical Pharmacist  [phone]  Elliot Gurney, LCSW Clinical Social Worker [phone]  Goals Addressed            This Visit's Progress   . medication optimization (pt-stated)        Current Barriers:  Marland Kitchen Memory gaps  Pharmacist Clinical Goal(s): Over the next 90 days, Jocelyn [name] will be adherent to all medications as evidenced by pharmacy fill history and patient report   Interventions: . Comprehensive medication review . Updated medication list . Provided [employer] OTC catalog  Patient Self Care Activities:  . Continue to take medications as prescribed . Purchase a home BP monitor with OTC catalog  Initial goal documentation

## 2018-09-19 NOTE — Chronic Care Management (AMB) (Signed)
  Chronic Care Management   Follow Up Note   09/19/2018 Name: Jocelyn [name] MRN: [id number] DOB: Dec 10, 1931  Subjective Jocelyn [name] is a 85 y.o. year old female who is a primary care patient of Lada, Satira Anis, MD. The CCM clinical pharmacist was consulted by patient's health plan. Initial pharmacy visit conducted via telephone. HIPAA identifiers verified. Patient's daughter Jocelyn [name] also present.  Review of patient status, including review of consultants reports, relevant laboratory and other test results, and collaboration with appropriate care team members and the patient's provider was performed as part of comprehensive patient evaluation and provision of chronic care management services.    SDOH (Social Determinants of Health) screening performed today: Alcohol/Substance Use Tobacco Use. See Care Plan for related entries.   Outpatient Encounter Medications as of 09/19/2018  Medication Sig Note  . aspirin EC 81 MG tablet Take 1 tablet (81 mg total) by mouth daily.   . calcium citrate-vitamin D 500-400 MG-UNIT chewable tablet Chew 1 tablet by mouth 2 (two) times daily.    . cloNIDine (CATAPRES) 0.1 MG tablet Take 1 tablet (0.1 mg total) by mouth every 8 (eight) hours. (Patient taking differently: Take 0.1 mg by mouth every 8 (eight) hours. Pt taking BID)   . diclofenac sodium (VOLTAREN) 1 % GEL Apply 4 g topically 4 (four) times daily as needed. On the knees 09/19/2018: Need refill   . famotidine (PEPCID) 20 MG tablet TAKE 1 TABLET (20 MG TOTAL) BY MOUTH 2 (TWO) TIMES DAILY. IF NEEDED FOR HEARTBURN DO NOT TAKE RANITIDINE   . ferrous sulfate 325 (65 FE) MG tablet Take 325 mg by mouth daily.   Marland Kitchen HYDROcodone-acetaminophen (NORCO/VICODIN) 5-325 MG tablet Take 1 tablet by mouth every 6 (six) hours as needed for moderate pain.   . isosorbide mononitrate (IMDUR) 120 MG 24 hr tablet TAKE 1 TABLET BY MOUTH EVERY DAY   . Olmesartan-amLODIPine-HCTZ 40-10-12.5 MG TABS Take 1 tablet by mouth  daily. For blood pressure   . pravastatin (PRAVACHOL) 20 MG tablet TAKE ONE PILL BY MOUTH TWO NIGHTS EACH WEEK [name] AND SUNDAYS, FOR EXAMPLE) 09/19/2018: Need refill  . albuterol (VENTOLIN HFA) 108 (90 Base) MCG/ACT inhaler Inhale 2 puffs into the lungs every 6 (six) hours as needed for wheezing or shortness of breath. (Patient not taking: Reported on 09/19/2018) 09/19/2018: allergy  . [DISCONTINUED] Multiple Vitamins-Minerals (CENTRUM SILVER 50+WOMEN) TABS Take 1 tablet by mouth daily.    No facility-administered encounter medications on file as of 09/19/2018.      Goals Addressed            This Visit's Progress   . medication optimization (pt-stated)        Current Barriers:  Marland Kitchen Memory gaps  Pharmacist Clinical Goal(s): Over the next 90 days, [name] will be adherent to all medications as evidenced by pharmacy fill history and patient report   Interventions: . Comprehensive medication review . Updated medication list . Provided [employer] OTC catalog  Patient Self Care Activities:  . Continue to take medications as prescribed . Purchase a home BP monitor with OTC catalog  Initial goal documentation        Plan and Follow up Telephone follow up appointment with care management team member scheduled for: 90 days with PharmD   Ruben Reason, PharmD Clinical Pharmacist [facility] [phone]

## 2018-10-09 NOTE — Progress Notes (Signed)
Please use home phone number [phone].

## 2018-10-10 ENCOUNTER — Ambulatory Visit
Payer: Medicare Other | Attending: Student in an Organized Health Care Education/Training Program | Admitting: Student in an Organized Health Care Education/Training Program

## 2018-10-10 ENCOUNTER — Other Ambulatory Visit: Payer: Self-pay

## 2018-10-10 DIAGNOSIS — M533 Sacrococcygeal disorders, not elsewhere classified: Secondary | ICD-10-CM

## 2018-10-10 DIAGNOSIS — M47816 Spondylosis without myelopathy or radiculopathy, lumbar region: Secondary | ICD-10-CM

## 2018-10-10 DIAGNOSIS — G8929 Other chronic pain: Secondary | ICD-10-CM

## 2018-10-10 DIAGNOSIS — G894 Chronic pain syndrome: Secondary | ICD-10-CM | POA: Diagnosis not present

## 2018-10-10 DIAGNOSIS — M5136 Other intervertebral disc degeneration, lumbar region: Secondary | ICD-10-CM | POA: Diagnosis not present

## 2018-10-10 MED ORDER — HYDROCODONE-ACETAMINOPHEN 5-325 MG PO TABS: 1.0000 | ORAL_TABLET | Freq: Two times a day (BID) | ORAL | 0 refills | Status: DC | PRN

## 2018-10-10 NOTE — Progress Notes (Addendum)
Pain Management Virtual Encounter Note - Virtual Visit via Telephone Telehealth (real-time audio visits between healthcare provider and patient).   Patient's Phone No. & Preferred Pharmacy:  [phone] (home); [phone] (mobile); (Preferred) [phone] No e-mail address on record  [id number] - [place], [place] [address] Phone: [phone] Fax: [phone]    Pre-screening note:  Our staff contacted Ms. Forst and offered her an "in person", "face-to-face" appointment versus a telephone encounter. She indicated preferring the telephone encounter, at this time.   Reason for Virtual Visit: COVID-19*  Social distancing based on CDC and AMA recommendations.   I contacted [name] on 10/10/2018 via telephone.      I clearly identified myself as Gillis Santa, MD. I verified that I was speaking with the correct person using two identifiers (Name: KILIA FIGARO, and date of birth: 05-31-1931).  Advanced Informed Consent I sought verbal advanced consent from [name] for virtual visit interactions. I informed Ms. Borgen of possible security and privacy concerns, risks, and limitations associated with providing "not-in-person" medical evaluation and management services. I also informed Ms. Falso of the availability of "in-person" appointments. Finally, I informed her that there would be a charge for the virtual visit and that she could be  personally, fully or partially, financially responsible for it. Ms. Lanzi expressed understanding and agreed to proceed.   Historic Elements   Ms. RENNAE WALDNER is a 85 y.o. year old, female patient evaluated today after her last encounter by our practice on 07/10/2018. Ms. Glazewski  has a past medical history of Arthritis of both knees (08/01/2015), Colitis, Essential hypertension, benign (07/28/2014), GERD (gastroesophageal reflux disease), iron deficiency anemia (09/02/2015), Hyperlipidemia,  Hypertension, Lumbar pain, Medicare annual wellness visit, subsequent (04/07/2017), OSA on CPAP (08/01/2015), Overweight (BMI 25.0-29.9) (07/10/2013), Prediabetes (09/02/2015), and SOB (shortness of breath) (07/10/2013). She also  has a past surgical history that includes Cataract extraction; Abdominal hysterectomy; Cholecystectomy; Lumbar disc surgery; and Colonoscopy with propofol (N/A, 04/09/2018). Ms. Blitz has a current medication list which includes the following prescription(s): aspirin ec, calcium citrate-vitamin d, clonidine, diclofenac sodium, famotidine, ferrous sulfate, hydrocodone-acetaminophen, isosorbide mononitrate, olmesartan-amlodipine-hctz, and pravastatin. She  reports that she has never smoked. She has never used smokeless tobacco. She reports that she does not drink alcohol or use drugs. Ms. Lombardozzi has No Known Allergies.   HPI  Today, she is being contacted for medication management.   No change in medical history since last visit.  Patient's pain is at baseline.  Patient continues multimodal pain regimen as prescribed.  States that it provides pain relief and improvement in functional status.  States that her daughter will pick up her hydrocodone prescription that was written on 07/10/2018.  She states that hydrocodone when taken during a pain flare does help decrease her pain.  Pharmacotherapy Assessment  Analgesic:  07/15/2018  1   05/23/2018  Hydrocodone-Acetamin 5-325 MG  30.00  30 Bi Lat   [id number]   Nor (4618)   0  5.00 MME  Medicare      Monitoring: Pharmacotherapy: No side-effects or adverse reactions reported. [place] PMP: PDMP reviewed during this encounter.       Compliance: No problems identified. Effectiveness: Clinically acceptable. Plan: Refer to "POC".  UDS:  Summary  Date Value Ref Range Status  03/06/2018 FINAL  Final    Comment:    ==================================================================== TOXASSURE COMP DRUG  ANALYSIS,UR ==================================================================== Test  Result       Flag       Units Drug Absent but Declared for Prescription Verification   Hydrocodone                    Not Detected UNEXPECTED ng/mg creat   Gabapentin                     Not Detected UNEXPECTED   Acetaminophen                  Not Detected UNEXPECTED    Acetaminophen, as indicated in the declared medication list, is    not always detected even when used as directed.   Diclofenac                     Not Detected UNEXPECTED    Topical diclofenac, as indicated in the declared medication list,    is not always detected even when used as directed.   Salicylate                     Not Detected UNEXPECTED    Aspirin, as indicated in the declared medication list, is not    always detected even when used as directed.   Clonidine                      Not Detected UNEXPECTED ==================================================================== Test                      Result    Flag   Units      Ref Range   Creatinine              41               mg/dL      >=20 ==================================================================== Declared Medications:  The flagging and interpretation on this report are based on the  following declared medications.  Unexpected results may arise from  inaccuracies in the declared medications.  **Note: The testing scope of this panel includes these medications:  Clonidine (Catapres)  Gabapentin (Neurontin)  Hydrocodone (Norco)  **Note: The testing scope of this panel does not include small to  moderate amounts of these reported medications:  Acetaminophen (Norco)  Aspirin (Aspirin 81)  Topical Diclofenac (Voltaren)  **Note: The testing scope of this panel does not include following  reported medications:  Albuterol  Amlodipine  Calcium (Calcium/Vitamin D)  Famotidine (Pepcid)  Hydrochlorothiazide (HCTZ)  Iron (Ferrous  Sulfate)  Isosorbide (Imdur)  Multivitamin (Centrum Silver)  Olmesartan  Potassium (K-Dur)  Pravastatin (Pravachol)  Supplement (Centrum Silver)  Vitamin D (Calcium/Vitamin D) ==================================================================== For clinical consultation, please call [phone]. ====================================================================    Laboratory Chemistry Profile (12 mo)  Renal: 08/01/2018: BUN 14; BUN/Creatinine Ratio NOT APPLICABLE; Creat [id number]  Lab Results  Component Value Date   GFRAA 78 08/01/2018   GFRNONAA 67 08/01/2018   Hepatic: No results found for requested labs within last 8760 hours. Lab Results  Component Value Date   AST 19 08/01/2018   ALT 11 08/01/2018   Other: No results found for requested labs within last 8760 hours. Note: Above Lab results reviewed.  Assessment  The primary encounter diagnosis was Lumbar facet arthropathy. Diagnoses of Lumbar degenerative disc disease, Chronic SI joint pain, and Chronic pain syndrome were also pertinent to this visit.  Plan of Care  I have discontinued Samerah Warshawsky. Joye's albuterol. I have  also changed her HYDROcodone-acetaminophen. Additionally, I am having her maintain her aspirin EC, calcium citrate-vitamin D, ferrous sulfate, isosorbide mononitrate, famotidine, pravastatin, cloNIDine, Olmesartan-amLODIPine-HCTZ, and diclofenac sodium.  Pharmacotherapy (Medications Ordered): Meds ordered this encounter  Medications  . HYDROcodone-acetaminophen (NORCO/VICODIN) 5-325 MG tablet    Sig: Take 1 tablet by mouth 2 (two) times daily as needed for moderate pain. For chronic pain    Dispense:  30 tablet    Refill:  0   Follow-up plan:   Return in about 8 weeks (around 12/05/2018) for Medication Management, virtual.    Recent Visits No visits were found meeting these conditions.  Showing recent visits within past 90 days and meeting all other requirements   Today's Visits Date Type  Provider Dept  10/10/18 Office Visit Gillis Santa, MD [facility]  Showing today's visits and meeting all other requirements   Future Appointments No visits were found meeting these conditions.  Showing future appointments within next 90 days and meeting all other requirements   I discussed the assessment and treatment plan with the patient. The patient was provided an opportunity to ask questions and all were answered. The patient agreed with the plan and demonstrated an understanding of the instructions.  Patient advised to call back or seek an in-person evaluation if the symptoms or condition worsens.  Total duration of non-face-to-face encounter: 35minutes.  Note by: Gillis Santa, MD Date: 10/10/2018; Time: 1:57 PM  Note: This dictation was prepared with Dragon dictation. Any transcriptional errors that may result from this process are unintentional.  Disclaimer:  * Given the special circumstances of the COVID-19 pandemic, the federal government has announced that the Office for Civil Rights (OCR) will exercise its enforcement discretion and will not impose penalties on physicians using telehealth in the event of noncompliance with regulatory requirements under the [place] (HIPAA) in connection with the good faith provision of telehealth during the XX123456 national public health emergency. ([place])

## 2018-10-10 NOTE — Addendum Note (Signed)
Addended by: Gillis Santa on: 10/10/2018 01:57 PM   Modules accepted: Orders, Level of Service

## 2018-11-04 ENCOUNTER — Other Ambulatory Visit: Payer: Self-pay | Admitting: Family Medicine

## 2018-11-04 NOTE — Telephone Encounter (Signed)
Requested medication (s) are due for refill today: yes  Requested medication (s) are on the active medication list: yes  Last refill: 04/10/2018  Future visit scheduled: yes  Notes to clinic:  Review for refill   Requested Prescriptions  Pending Prescriptions Disp Refills   famotidine (PEPCID) 20 MG tablet [Pharmacy Med Name: FAMOTIDINE 20 MG TABLET] 180 tablet 0    Sig: TAKE 1 TABLET BY MOUTH 2 TIMES DAILY. IF NEEDED FOR HEARTBURN DO NOT TAKE RANITIDINE     Gastroenterology:  H2 Antagonists Passed - 11/04/2018 12:02 PM      Passed - Valid encounter within last 12 months    Recent Outpatient Visits          3 months ago Essential hypertension, benign   [place], NP   5 months ago Fall, subsequent encounter   [facility] Lada, Satira Anis, MD   8 months ago Abnormal colonoscopy   [place], MD   9 months ago Change in stool   [place], Satira Anis, MD   9 months ago Hospital discharge follow-up   [place], Satira Anis, MD      Future Appointments            In 4 weeks [facility], [facility], [facility]

## 2018-11-08 ENCOUNTER — Other Ambulatory Visit: Payer: Self-pay

## 2018-11-08 NOTE — Patient Outreach (Signed)
[facility]) Care Management  11/08/2018  Jocelyn [name] 11/16/1931 [id number]   Medication Adherence call to [name] Telephone call to Patient regarding Medication Adherence unable to reach patient. [name] is showing past due on Olmesartan/Amlodipine/Hctz under [place].   [place] Management Direct Dial [phone]  Fax [phone] [email or web address]

## 2018-11-19 ENCOUNTER — Other Ambulatory Visit: Payer: Self-pay

## 2018-11-19 DIAGNOSIS — I10 Essential (primary) hypertension: Secondary | ICD-10-CM

## 2018-11-21 ENCOUNTER — Other Ambulatory Visit: Payer: Self-pay

## 2018-11-21 DIAGNOSIS — I10 Essential (primary) hypertension: Secondary | ICD-10-CM

## 2018-11-22 MED ORDER — OLMESARTAN-AMLODIPINE-HCTZ 40-10-12.5 MG PO TABS: 1.0000 | ORAL_TABLET | Freq: Every day | ORAL | 1 refills | Status: DC

## 2018-12-03 ENCOUNTER — Other Ambulatory Visit: Payer: Self-pay

## 2018-12-03 ENCOUNTER — Encounter: Payer: Self-pay | Admitting: Student in an Organized Health Care Education/Training Program

## 2018-12-03 ENCOUNTER — Encounter: Payer: Self-pay | Admitting: Family Medicine

## 2018-12-03 ENCOUNTER — Telehealth: Payer: Self-pay | Admitting: Family Medicine

## 2018-12-03 ENCOUNTER — Ambulatory Visit (INDEPENDENT_AMBULATORY_CARE_PROVIDER_SITE_OTHER): Payer: Medicare Other | Admitting: Family Medicine

## 2018-12-03 DIAGNOSIS — M25561 Pain in right knee: Secondary | ICD-10-CM

## 2018-12-03 DIAGNOSIS — E782 Mixed hyperlipidemia: Secondary | ICD-10-CM

## 2018-12-03 DIAGNOSIS — G8929 Other chronic pain: Secondary | ICD-10-CM

## 2018-12-03 DIAGNOSIS — I209 Angina pectoris, unspecified: Secondary | ICD-10-CM

## 2018-12-03 DIAGNOSIS — Z9989 Dependence on other enabling machines and devices: Secondary | ICD-10-CM

## 2018-12-03 DIAGNOSIS — G4733 Obstructive sleep apnea (adult) (pediatric): Secondary | ICD-10-CM

## 2018-12-03 DIAGNOSIS — K219 Gastro-esophageal reflux disease without esophagitis: Secondary | ICD-10-CM | POA: Diagnosis not present

## 2018-12-03 DIAGNOSIS — G894 Chronic pain syndrome: Secondary | ICD-10-CM

## 2018-12-03 DIAGNOSIS — M170 Bilateral primary osteoarthritis of knee: Secondary | ICD-10-CM

## 2018-12-03 DIAGNOSIS — I10 Essential (primary) hypertension: Secondary | ICD-10-CM

## 2018-12-03 DIAGNOSIS — M25562 Pain in left knee: Secondary | ICD-10-CM

## 2018-12-03 DIAGNOSIS — Z862 Personal history of diseases of the blood and blood-forming organs and certain disorders involving the immune mechanism: Secondary | ICD-10-CM

## 2018-12-03 MED ORDER — CLONIDINE HCL 0.1 MG PO TABS: 0.1000 mg | ORAL_TABLET | Freq: Two times a day (BID) | ORAL | 1 refills | Status: DC

## 2018-12-03 MED ORDER — FERROUS SULFATE 325 (65 FE) MG PO TABS: 325.0000 mg | ORAL_TABLET | Freq: Every day | ORAL | 11 refills | Status: DC

## 2018-12-03 NOTE — Telephone Encounter (Signed)
Patient states was told she could obtain some supplies OTC, has been waiting 3 month and has not heard back from her insurance - would like some help obtaining.

## 2018-12-03 NOTE — Progress Notes (Signed)
Name: Jocelyn [name]   MRN: [id number]    DOB: 1931-06-10   Date:12/03/2018       Progress Note  Subjective  Chief Complaint  Chief Complaint  Patient presents with  . Follow-up    I connected with  [name] on 12/03/18 at  1:00 PM EST by telephone and verified that I am speaking with the correct person using two identifiers.  I discussed the limitations, risks, security and privacy concerns of performing an evaluation and management service by telephone and the availability of in person appointments. Staff also discussed with the patient that there may be a patient responsible charge related to this service. Patient Location: Home Provider Location: Office Additional Individuals present: Daughter - [name]  HPI  Hypertension Patient is on olmesartan 40mg , amlodipine 10mg  and HCTZ12.5mg  daily,imdur 120mg  daily and clonidine 0.1mg  BID. Has a history of angina- none in the last year or so.  Takes medications as prescribed with no missed doses a month.  She is relatively compliant with low-salt diet, no added salt Denies chest pain, headaches, blurry vision, dizziness, shortness of breath.  Hyperlipidemia Patient rx pravastatin 20mg  twice a week, aspirin daily 81mg .Takes medications as prescribed with possible missed doses.  Does eat a good amount of fried foods, cut back on red meats.  Denies myalgias or chest pain. Lab Results  Component Value Date   CHOL 203 (H) 08/01/2018   HDL 65 08/01/2018   LDLCALC 120 (H) 08/01/2018   TRIG 84 08/01/2018   CHOLHDL 3.1 08/01/2018   GERD Takes pepcid BID, states this is well controlled.  Denies any food triggers that she can recognize. Stable and unchanged; she is doing well.  Osteoarthritis/CPS Follows up with Dr. Holley Raring- got a joint injection previously that helped, taked vicodin PRN. Uses diclofenac gel on her knees.  She has appointment tomorrow.   OSA Self reported cpap compliance: 100%.  Was seeing Dr. Ashby Dawes,  who is no longer with the practice.   Iron deficiency anemia.  She is on iron tablets to take due to stable anemia - last labs showed improvement in Hgb, normal MCHC and RDW.  Denies fatigue, shortness of breath.  She had colonoscopy in March 2020 of this year-showing nonbleeding hemorrhoids and possible healing colitis patient denies dark tarry stools or blood or diarrhea  Patient Active Problem List   Diagnosis Date Noted  . SI joint arthritis 03/22/2018  . Chronic pain of both knees 03/22/2018  . Chronic pain syndrome 03/22/2018  . Right sided colitis 12/30/2017  . Impaired fasting glucose 02/06/2017  . Carpal tunnel syndrome on right 09/05/2015  . Gastroesophageal reflux disease without esophagitis 09/05/2015  . Hx of iron deficiency anemia 09/02/2015  . OSA on CPAP 08/01/2015  . Arthritis of both knees 08/01/2015  . Controlled substance agreement signed 07/08/2015  . Essential hypertension, benign 07/28/2014  . Lumbar degenerative disc disease 07/28/2014  . 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 Angina pectoris ([place]) 07/10/2013  . HLD (hyperlipidemia) 07/10/2013  . Cerebrovascular accident (CVA) ([place]) 07/10/2013  . Hearing loss 01/29/2009  . Arthritis sicca 05/23/2006    Past Surgical History:  Procedure Laterality Date  . ABDOMINAL HYSTERECTOMY    . CATARACT EXTRACTION    . CHOLECYSTECTOMY    . COLONOSCOPY WITH PROPOFOL N/A 04/09/2018   Procedure: COLONOSCOPY WITH PROPOFOL;  Surgeon: Lin Landsman, MD;  Location: [facility] ENDOSCOPY;  Service: Gastroenterology;  Laterality: N/A;  . LUMBAR [place] SURGERY      Family History  Problem Relation Age of Onset  . Hypertension Daughter   . Diabetes Daughter   . Cancer Daughter        breast  . Diabetes Mother   . Heart disease Mother   . Cancer Father        lung; smoker  . Anuerysm Son     Social History   Socioeconomic History  . Marital status: Widowed    Spouse name: Not on file  . Number of children: 5  . Years of  education: Not on file  . Highest education level: Not on file  Occupational History  . Not on file  Social Needs  . Financial resource strain: Somewhat hard  . Food insecurity    Worry: Sometimes true    Inability: Never true  . Transportation needs    Medical: No    Non-medical: No  Tobacco Use  . Smoking status: Never Smoker  . Smokeless tobacco: Never Used  Substance and Sexual Activity  . Alcohol use: No    Alcohol/week: 0.0 standard drinks  . Drug use: No  . Sexual activity: Not Currently  Lifestyle  . Physical activity    Days per week: 0 days    Minutes per session: 0 min  . Stress: Not at all  Relationships  . Social connections    Talks on phone: More than three times a week    Gets together: Three times a week    Attends religious service: More than 4 times per year    Active member of club or organization: No    Attends meetings of clubs or organizations: Never    Relationship status: Widowed  . Intimate partner violence    Fear of current or ex partner: No    Emotionally abused: No    Physically abused: No    Forced sexual activity: No  Other Topics Concern  . Not on file  Social History Narrative  . Not on file     Current Outpatient Medications:  .  aspirin EC 81 MG tablet, Take 1 tablet (81 mg total) by mouth daily., Disp: 30 tablet, Rfl: 12 .  calcium citrate-vitamin D 500-400 MG-UNIT chewable tablet, Chew 1 tablet by mouth 2 (two) times daily. , Disp: , Rfl:  .  cloNIDine (CATAPRES) 0.1 MG tablet, Take 1 tablet (0.1 mg total) by mouth every 8 (eight) hours. (Patient taking differently: Take 0.1 mg by mouth every 8 (eight) hours. Pt taking BID), Disp: 270 tablet, Rfl: 1 .  diclofenac sodium (VOLTAREN) 1 % GEL, Apply 4 g topically 4 (four) times daily as needed. On the knees, Disp: 100 g, Rfl: 5 .  famotidine (PEPCID) 20 MG tablet, TAKE 1 TABLET BY MOUTH 2 TIMES DAILY. IF NEEDED FOR HEARTBURN DO NOT TAKE RANITIDINE, Disp: 180 tablet, Rfl: 1 .   ferrous sulfate 325 (65 FE) MG tablet, Take 325 mg by mouth daily., Disp: , Rfl:  .  HYDROcodone-acetaminophen (NORCO/VICODIN) 5-325 MG tablet, Take 1 tablet by mouth 2 (two) times daily as needed for moderate pain. For chronic pain, Disp: 30 tablet, Rfl: 0 .  isosorbide mononitrate (IMDUR) 120 MG 24 hr tablet, TAKE 1 TABLET BY MOUTH EVERY DAY, Disp: 90 tablet, Rfl: 3 .  Olmesartan-amLODIPine-HCTZ 40-10-12.5 MG TABS, Take 1 tablet by mouth daily. For blood pressure, Disp: 90 tablet, Rfl: 1 .  pravastatin (PRAVACHOL) 20 MG tablet, TAKE ONE PILL BY MOUTH TWO NIGHTS EACH WEEK [facility] AND SUNDAYS, FOR EXAMPLE), Disp: 26 tablet, Rfl: 1  No Known Allergies  I personally reviewed active problem list, medication list, allergies, notes from last encounter, lab results with the patient/caregiver today.   ROS  Constitutional: Negative for fever or weight change.  Respiratory: Negative for cough and shortness of breath.   Cardiovascular: Negative for chest pain or palpitations.  Gastrointestinal: Negative for abdominal pain, no bowel changes.  Musculoskeletal: Negative for gait problem or joint swelling.  Skin: Negative for rash.  Neurological: Negative for dizziness or headache.  No other specific complaints in a complete review of systems (except as listed in HPI above).  Objective  Virtual encounter, vitals not obtained.  There is no height or weight on file to calculate BMI.  Physical Exam  Pulmonary/Chest: Effort normal. No respiratory distress. Speaking in complete sentences Neurological: Pt is alert and oriented to person, place, and time. Speech is normal. Psychiatric: Patient has a normal mood and affect. behavior is normal. Judgment and thought content normal.  No results found for this or any previous visit (from the past 72 hour(s)).  PHQ2/9: Depression screen [facility] 2/9 12/03/2018 08/01/2018 07/26/2018 04/18/2018 03/22/2018  Decreased Interest 0 0 0 0 0  Down, Depressed, Hopeless 0  0 0 0 0  PHQ - 2 Score 0 0 0 0 0  Altered sleeping 0 0 - - -  Tired, decreased energy 0 0 - - -  Change in appetite 0 0 - - -  Feeling bad or failure about yourself  0 0 - - -  Trouble concentrating 0 0 - - -  Moving slowly or fidgety/restless 0 0 - - -  Suicidal thoughts 0 0 - - -  PHQ-9 Score 0 0 - - -  Difficult doing work/chores Not difficult at all Not difficult at all - - -  Some recent data might be hidden   PHQ-2/9 Result is negative.    Fall Risk: Fall Risk  12/03/2018 08/01/2018 07/26/2018 05/24/2018 04/18/2018  Falls in the past year? 0 1 1 1  0  Number falls in past yr: 0 1 1 0 -  Injury with Fall? 0 1 1 1  -  Risk for fall due to : - - Impaired balance/gait;Impaired mobility - -  Follow up Falls evaluation completed - - - -   Assessment & Plan  1. Essential hypertension, benign - Stable and controlled per her report. - cloNIDine (CATAPRES) 0.1 MG tablet; Take 1 tablet (0.1 mg total) by mouth 2 (two) times daily.  Dispense: 180 tablet; Refill: 1  2. Angina pectoris ([place]) - No concerns  3. Mixed hyperlipidemia - Taking statin   4. Gastroesophageal reflux disease without esophagitis - Stable on H2 blocker  5. Chronic pain syndrome - Seeing Dr. Holley Raring  6. Chronic pain of both knees - Seeing Dr. Holley Raring  7. Arthritis of both knees - Seeing Dr. Holley Raring  8. OSA on CPAP - Compliant on CPAP, she would like to have follow up with pulmonology  - Ambulatory referral to Pulmonology  9. Hx of iron deficiency anemia - Labs 3 months ago showed improvement; will recheck in 3 months. - ferrous sulfate 325 (65 FE) MG tablet; Take 1 tablet (325 mg total) by mouth daily.  Dispense: 30 tablet; Refill: 11   I discussed the assessment and treatment plan with the patient. The patient was provided an opportunity to ask questions and all were answered. The patient agreed with the plan and demonstrated an understanding of the instructions.   The patient was advised to call back or  seek an in-person evaluation if the symptoms worsen or if the condition fails to improve as anticipated.  I provided 24 minutes of non-face-to-face time during this encounter.  Hubbard Hartshorn, FNP

## 2018-12-04 ENCOUNTER — Other Ambulatory Visit: Payer: Self-pay

## 2018-12-04 ENCOUNTER — Ambulatory Visit
Payer: Medicare Other | Attending: Student in an Organized Health Care Education/Training Program | Admitting: Student in an Organized Health Care Education/Training Program

## 2018-12-04 ENCOUNTER — Ambulatory Visit: Payer: Self-pay | Admitting: Pharmacist

## 2018-12-04 ENCOUNTER — Encounter: Payer: Self-pay | Admitting: Student in an Organized Health Care Education/Training Program

## 2018-12-04 DIAGNOSIS — M533 Sacrococcygeal disorders, not elsewhere classified: Secondary | ICD-10-CM | POA: Diagnosis not present

## 2018-12-04 DIAGNOSIS — M47818 Spondylosis without myelopathy or radiculopathy, sacral and sacrococcygeal region: Secondary | ICD-10-CM

## 2018-12-04 DIAGNOSIS — M25562 Pain in left knee: Secondary | ICD-10-CM

## 2018-12-04 DIAGNOSIS — M47816 Spondylosis without myelopathy or radiculopathy, lumbar region: Secondary | ICD-10-CM | POA: Diagnosis not present

## 2018-12-04 DIAGNOSIS — M5136 Other intervertebral disc degeneration, lumbar region: Secondary | ICD-10-CM | POA: Diagnosis not present

## 2018-12-04 DIAGNOSIS — G894 Chronic pain syndrome: Secondary | ICD-10-CM

## 2018-12-04 DIAGNOSIS — M25561 Pain in right knee: Secondary | ICD-10-CM

## 2018-12-04 DIAGNOSIS — G8929 Other chronic pain: Secondary | ICD-10-CM

## 2018-12-04 MED ORDER — HYDROCODONE-ACETAMINOPHEN 5-325 MG PO TABS: 1.0000 | ORAL_TABLET | Freq: Two times a day (BID) | ORAL | 0 refills | Status: AC | PRN

## 2018-12-04 NOTE — Progress Notes (Signed)
Pain Management Virtual Encounter Note - Virtual Visit via Telephone Telehealth (real-time audio visits between healthcare provider and patient).   Patient's Phone No. & Preferred Pharmacy:  [phone] (home); [phone] (mobile); (Preferred) [phone] No e-mail address on record  [id number] - [place], [place] [address] Phone: [phone] Fax: [phone]    Pre-screening note:  Our staff contacted Jocelyn [name] and offered her an "in person", "face-to-face" appointment versus a telephone encounter. She indicated preferring the telephone encounter, at this time.   Reason for Virtual Visit: COVID-19*  Social distancing based on CDC and AMA recommendations.   I contacted Jocelyn [name] on 12/04/2018 via telephone.      I clearly identified myself as Gillis Santa, MD. I verified that I was speaking with the correct person using two identifiers (Name: Jocelyn [name], and date of birth: 1932-01-31).  Advanced Informed Consent I sought verbal advanced consent from Jocelyn [name] for virtual visit interactions. I informed Jocelyn [name] of possible security and privacy concerns, risks, and limitations associated with providing "not-in-person" medical evaluation and management services. I also informed Jocelyn [name] of the availability of "in-person" appointments. Finally, I informed her that there would be a charge for the virtual visit and that she could be  personally, fully or partially, financially responsible for it. Jocelyn [name] expressed understanding and agreed to proceed.   Historic Elements   Jocelyn [name] is a 85 y.o. year old, female patient evaluated today after her last encounter by our practice on 10/10/2018. Jocelyn [name]  has a past medical history of Arthritis of both knees (08/01/2015), Colitis, Essential hypertension, benign (07/28/2014), GERD (gastroesophageal reflux disease), iron deficiency anemia (09/02/2015),  Hyperlipidemia, Hypertension, Lumbar pain, Medicare annual wellness visit, subsequent (04/07/2017), OSA on CPAP (08/01/2015), Overweight (BMI 25.0-29.9) (07/10/2013), Prediabetes (09/02/2015), and SOB (shortness of breath) (07/10/2013). She also  has a past surgical history that includes Cataract extraction; Abdominal hysterectomy; Cholecystectomy; Lumbar disc surgery; and Colonoscopy with propofol (N/A, 04/09/2018). Jocelyn [name] has a current medication list which includes the following prescription(s): aspirin ec, calcium citrate-vitamin d, diclofenac sodium, famotidine, hydrocodone-acetaminophen, isosorbide mononitrate, olmesartan-amlodipine-hctz, pravastatin, clonidine, and ferrous sulfate. She  reports that she has never smoked. She has never used smokeless tobacco. She reports that she does not drink alcohol or use drugs. Jocelyn [name] has No Known Allergies.   HPI  Today, she is being contacted for medication management.   No change in medical history since last visit.  Patient's pain is at baseline.  Patient continues multimodal pain regimen as prescribed.  States that it provides pain relief and improvement in functional status.   Pharmacotherapy Assessment  Analgesic:  11/10/2018  1   10/10/2018  Hydrocodone-Acetamin 5-325 MG  30.00  15 Bi Lat   [id number]   [place] ([id number])   0  10.00 MME  Medicare   [place]    Monitoring: Pharmacotherapy: No side-effects or adverse reactions reported. [place] PMP: PDMP reviewed during this encounter.       Compliance: No problems identified. Effectiveness: Clinically acceptable. Plan: Refer to "POC".  UDS:  Summary  Date Value Ref Range Status  03/06/2018 FINAL  Final    Comment:    ==================================================================== TOXASSURE COMP DRUG ANALYSIS,UR ==================================================================== Test                             Result       Flag  Units Drug Absent but Declared for Prescription Verification    Hydrocodone                    Not Detected UNEXPECTED ng/mg creat   Gabapentin                     Not Detected UNEXPECTED   Acetaminophen                  Not Detected UNEXPECTED    Acetaminophen, as indicated in the declared medication list, is    not always detected even when used as directed.   Diclofenac                     Not Detected UNEXPECTED    Topical diclofenac, as indicated in the declared medication list,    is not always detected even when used as directed.   Salicylate                     Not Detected UNEXPECTED    Aspirin, as indicated in the declared medication list, is not    always detected even when used as directed.   Clonidine                      Not Detected UNEXPECTED ==================================================================== Test                      Result    Flag   Units      Ref Range   Creatinine              41               mg/dL      >=20 ==================================================================== Declared Medications:  The flagging and interpretation on this report are based on the  following declared medications.  Unexpected results may arise from  inaccuracies in the declared medications.  **Note: The testing scope of this panel includes these medications:  Clonidine (Catapres)  Gabapentin (Neurontin)  Hydrocodone (Norco)  **Note: The testing scope of this panel does not include small to  moderate amounts of these reported medications:  Acetaminophen (Norco)  Aspirin (Aspirin 81)  Topical Diclofenac (Voltaren)  **Note: The testing scope of this panel does not include following  reported medications:  Albuterol  Amlodipine  Calcium (Calcium/Vitamin D)  Famotidine (Pepcid)  Hydrochlorothiazide (HCTZ)  Iron (Ferrous Sulfate)  Isosorbide (Imdur)  Multivitamin (Centrum Silver)  Olmesartan  Potassium (K-Dur)  Pravastatin (Pravachol)  Supplement (Centrum Silver)  Vitamin D (Calcium/Vitamin  D) ==================================================================== For clinical consultation, please call [phone]. ====================================================================    Laboratory Chemistry Profile (12 mo)  Renal: 08/01/2018: BUN 14; BUN/Creatinine Ratio NOT APPLICABLE; Creat [id number]  Lab Results  Component Value Date   GFRAA 78 08/01/2018   GFRNONAA 67 08/01/2018   Hepatic: No results found for requested labs within last 8760 hours. Lab Results  Component Value Date   AST 19 08/01/2018   ALT 11 08/01/2018   Other: No results found for requested labs within last 8760 hours. Note: Above Lab results reviewed.    Assessment  The primary encounter diagnosis was Lumbar facet arthropathy. Diagnoses of Lumbar degenerative disc disease, Chronic SI joint pain, Chronic pain syndrome, SI joint arthritis, and Chronic pain of both knees were also pertinent to this visit.  Plan of Care  I am having Jocelyn [name]. Jocelyn [name] maintain her aspirin EC, calcium citrate-vitamin  D, isosorbide mononitrate, pravastatin, diclofenac sodium, famotidine, Olmesartan-amLODIPine-HCTZ, and HYDROcodone-acetaminophen.  Pharmacotherapy (Medications Ordered): Meds ordered this encounter  Medications  . HYDROcodone-acetaminophen (NORCO/VICODIN) 5-325 MG tablet    Sig: Take 1 tablet by mouth 2 (two) times daily as needed for moderate pain. For chronic pain    Dispense:  30 tablet    Refill:  0   Follow-up plan:   Return if symptoms worsen or fail to improve.   Recent Visits Date Type Provider Dept  10/10/18 Office Visit Gillis Santa, MD [facility]  Showing recent visits within past 90 days and meeting all other requirements   Today's Visits Date Type Provider Dept  12/04/18 Office Visit Gillis Santa, MD [facility]  Showing today's visits and meeting all other requirements   Future Appointments No visits were found meeting these conditions.  Showing future  appointments within next 90 days and meeting all other requirements   I discussed the assessment and treatment plan with the patient. The patient was provided an opportunity to ask questions and all were answered. The patient agreed with the plan and demonstrated an understanding of the instructions.  Patient advised to call back or seek an in-person evaluation if the symptoms or condition worsens.  Total duration of non-face-to-face encounter: 15 minutes.  Note by: Gillis Santa, MD Date: 12/04/2018; Time: 2:09 PM  Note: This dictation was prepared with Dragon dictation. Any transcriptional errors that may result from this process are unintentional.  Disclaimer:  * Given the special circumstances of the COVID-19 pandemic, the federal government has announced that the Office for Civil Rights (OCR) will exercise its enforcement discretion and will not impose penalties on physicians using telehealth in the event of noncompliance with regulatory requirements under the [place] (HIPAA) in connection with the good faith provision of telehealth during the XX123456 national public health emergency. ([place])

## 2018-12-04 NOTE — Telephone Encounter (Signed)
I mailed patient a copy of the [employer] OTC benefit catalog in August. Perhaps this is what she is referring to? She has to place her own order.   Contacted patient today to review how the benefit catalog works. Unfortunately, patient was unavailable. I left a HIPAA complaint voicemail with my contact information.   Ruben Reason, PharmD Clinical Pharmacist [facility] [phone]

## 2018-12-04 NOTE — Chronic Care Management (AMB) (Signed)
  Chronic Care Management   Note  12/04/2018 Name: THADDEUS TESKA MRN: [id number] DOB: Nov 08, 1931  85 y.o. year old female patient of Raelyn Ensign, [place]. Outreach to patient today to follow up on statement patient expressed to Raelyn Ensign at recent office visit regarding OTC supplies she was missing.   Was unable to reach patient via telephone today and have left HIPAA compliant voicemail asking patient to return my call. (unsuccessful outreach #1).  Follow up plan: A HIPPA compliant phone message was left for the patient providing contact information and requesting a return call.  The care management team will reach out to the patient again over the next 5-7 days.   Ruben Reason, PharmD Clinical Pharmacist [facility] [phone]

## 2018-12-19 ENCOUNTER — Ambulatory Visit: Payer: Self-pay | Admitting: Pharmacist

## 2018-12-19 ENCOUNTER — Telehealth: Payer: Self-pay

## 2018-12-19 DIAGNOSIS — I10 Essential (primary) hypertension: Secondary | ICD-10-CM

## 2018-12-19 NOTE — Chronic Care Management (AMB) (Signed)
  Chronic Care Management   Note  12/19/2018 Name: Jocelyn [name] MRN: [id number] DOB: 17-May-1931  85 y.o. year old female patient of Raelyn Ensign, [place]. Outreach to patient today to follow up on statement patient expressed to Raelyn Ensign at recent office visit regarding OTC supplies she was missing.   Was unable to reach patient via telephone today and have left HIPAA compliant voicemail asking patient to return my call. (unsuccessful outreach #2).  Follow up plan: A HIPPA compliant phone message was left for the patient providing contact information and requesting a return call.  The care management team will reach out to the patient again over the next 5-7 days.   Ruben Reason, PharmD Clinical Pharmacist [facility] [phone]

## 2018-12-25 ENCOUNTER — Ambulatory Visit: Payer: Self-pay | Admitting: Pharmacist

## 2018-12-25 ENCOUNTER — Telehealth: Payer: Self-pay

## 2018-12-25 DIAGNOSIS — I10 Essential (primary) hypertension: Secondary | ICD-10-CM

## 2018-12-25 NOTE — Chronic Care Management (AMB) (Signed)
  Chronic Care Management   Follow Up Note   12/25/2018 Name: Jocelyn [name] MRN: [id number] DOB: [birth year]  Subjective Jocelyn [name] is a 85 y.o. year old female who is a primary care patient of Hubbard Hartshorn, FNP. CCM clinical pharmacist received incoming call from patient's daughter, Foye Clock.   Review of patient status, including review of consultants reports, relevant laboratory and other test results, and collaboration with appropriate care team members and the patient's provider was performed as part of comprehensive patient evaluation and provision of chronic care management services.    Objective  Outpatient Encounter Medications as of 12/25/2018  Medication Sig Note  . aspirin EC 81 MG tablet Take 1 tablet (81 mg total) by mouth daily.   . calcium citrate-vitamin D 500-400 MG-UNIT chewable tablet Chew 1 tablet by mouth 2 (two) times daily.    . cloNIDine (CATAPRES) 0.1 MG tablet Take 1 tablet (0.1 mg total) by mouth 2 (two) times daily.   . diclofenac sodium (VOLTAREN) 1 % GEL Apply 4 g topically 4 (four) times daily as needed. On the knees 10/09/2018: Would like a refill.  Almost gone.   . famotidine (PEPCID) 20 MG tablet TAKE 1 TABLET BY MOUTH 2 TIMES DAILY. IF NEEDED FOR HEARTBURN DO NOT TAKE RANITIDINE   . ferrous sulfate 325 (65 FE) MG tablet Take 1 tablet (325 mg total) by mouth daily.   Marland Kitchen HYDROcodone-acetaminophen (NORCO/VICODIN) 5-325 MG tablet Take 1 tablet by mouth 2 (two) times daily as needed for moderate pain. For chronic pain   . isosorbide mononitrate (IMDUR) 120 MG 24 hr tablet TAKE 1 TABLET BY MOUTH EVERY DAY   . Olmesartan-amLODIPine-HCTZ 40-10-12.5 MG TABS Take 1 tablet by mouth daily. For blood pressure   . pravastatin (PRAVACHOL) 20 MG tablet TAKE ONE PILL BY MOUTH TWO NIGHTS EACH WEEK [facility] AND SUNDAYS, FOR EXAMPLE) 09/19/2018: Need refill   No facility-administered encounter medications on file as of 12/25/2018.      Goals Addressed             This Visit's Progress   . COMPLETED: medication optimization (pt-stated)        Current Barriers:  Marland Kitchen Memory gaps  Pharmacist Clinical Goal(s): Over the next 90 days, Ms. Sparhawk will be adherent to all medications as evidenced by pharmacy fill history and patient report   Interventions: . Comprehensive medication review . Updated medication list . Provided [employer] OTC catalog . Updated 11/24: again provided [facility] OTC catalog and [place] OTC ordering phone number to Foye Clock  Patient Self Care Activities:  . Continue to take medications as prescribed . Purchase a home BP monitor with OTC catalog  Please see past updates related to this goal by clicking on the "Past Updates" button in the selected goal         Plan:  Recommendations for patient: utilize [facility] OTC benefit catalog. Contact [employer] for specific details regarding debit cards/transactions.   Follow up The patient has been provided with contact information for the care management team and has been advised to call with any health related questions or concerns.    Ruben Reason, PharmD Clinical Pharmacist [facility] [phone]

## 2019-01-11 ENCOUNTER — Telehealth: Payer: Self-pay | Admitting: Family Medicine

## 2019-01-11 NOTE — Telephone Encounter (Signed)
HYDROcodone-acetaminophen (NORCO/VICODIN) 5-325 MG tablet     Patient requesting refill.     Pharmacy:  [id number] [address], [place] Phone:  [phone]  Fax:  [phone]

## 2019-01-11 NOTE — Telephone Encounter (Signed)
Do you refill this medication for her

## 2019-01-11 NOTE — Telephone Encounter (Signed)
She sees Dr. Holley Raring for pain management; would need to come from him if it is being prescribed, however I do not see it on her active med list.

## 2019-01-14 ENCOUNTER — Telehealth: Payer: Self-pay | Admitting: Family Medicine

## 2019-01-14 ENCOUNTER — Other Ambulatory Visit: Payer: Self-pay | Admitting: Emergency Medicine

## 2019-01-14 NOTE — Telephone Encounter (Signed)
Left message for patient

## 2019-01-14 NOTE — Telephone Encounter (Signed)
Medication refill: pravastatin (PRAVACHOL) 20 MG tablet [id number]     Pharmacy:  [id number] [address], [place] Phone:  [phone]  Fax:  [phone]       Pt aware of turn around time. Pt is out of medication.

## 2019-01-14 NOTE — Telephone Encounter (Signed)
Note pend to [employer]

## 2019-01-15 MED ORDER — PRAVASTATIN SODIUM 20 MG PO TABS: ORAL_TABLET | ORAL | 1 refills | Status: DC

## 2019-01-22 ENCOUNTER — Telehealth: Payer: Self-pay | Admitting: *Deleted

## 2019-01-23 ENCOUNTER — Other Ambulatory Visit: Payer: Self-pay

## 2019-01-23 ENCOUNTER — Encounter: Payer: Self-pay | Admitting: Student in an Organized Health Care Education/Training Program

## 2019-01-23 ENCOUNTER — Ambulatory Visit
Payer: Medicare Other | Attending: Student in an Organized Health Care Education/Training Program | Admitting: Student in an Organized Health Care Education/Training Program

## 2019-01-23 DIAGNOSIS — M5136 Other intervertebral disc degeneration, lumbar region: Secondary | ICD-10-CM

## 2019-01-23 DIAGNOSIS — M47818 Spondylosis without myelopathy or radiculopathy, sacral and sacrococcygeal region: Secondary | ICD-10-CM

## 2019-01-23 DIAGNOSIS — M47816 Spondylosis without myelopathy or radiculopathy, lumbar region: Secondary | ICD-10-CM | POA: Diagnosis not present

## 2019-01-23 DIAGNOSIS — G894 Chronic pain syndrome: Secondary | ICD-10-CM | POA: Diagnosis not present

## 2019-01-23 DIAGNOSIS — G8929 Other chronic pain: Secondary | ICD-10-CM

## 2019-01-23 DIAGNOSIS — M533 Sacrococcygeal disorders, not elsewhere classified: Secondary | ICD-10-CM | POA: Diagnosis not present

## 2019-01-23 MED ORDER — HYDROCODONE-ACETAMINOPHEN 5-325 MG PO TABS: 1.0000 | ORAL_TABLET | Freq: Two times a day (BID) | ORAL | 0 refills | Status: DC | PRN

## 2019-01-23 NOTE — Progress Notes (Signed)
Virtual Encounter - Pain Management PROVIDER NOTE: Information contained herein reflects review and annotations entered in association with encounter. Patient information is provided elsewhere in the medical record. Interpretation of information and data should be left to medically trained personnel. Document created using STT technology, any transcriptional errors that may result from process are unintentional.    Contact & Pharmacy Preferred: [phone] Home: [phone] (home) Mobile: [phone] (mobile) E-mail: No e-mail address on record  [id number] [place], [place] - [address] [place] [address] Phone: [phone] Fax: [phone]   Pre-screening  Jocelyn [name] offered "in-person" vs "virtual" encounter. She indicated preferring virtual for this encounter.   Reason COVID-19*  Social distancing based on CDC and AMA recommendations.   I contacted Jocelyn [name] on 01/23/2019 via telephone (unable to contact patient via video visit).      I clearly identified myself as Gillis Santa, MD. I verified that I was speaking with the correct person using two identifiers (Name: Jocelyn [name], and date of birth: [birth year]).  Consent I sought verbal advanced consent from Jocelyn [name] for virtual visit interactions. I informed Jocelyn [name] of possible security and privacy concerns, risks, and limitations associated with providing "not-in-person" medical evaluation and management services. I also informed Jocelyn [name] of the availability of "in-person" appointments. Finally, I informed her that there would be a charge for the virtual visit and that she could be  personally, fully or partially, financially responsible for it. Jocelyn [name] expressed understanding and agreed to proceed.   Historic Elements   Jocelyn [name] is a 85 y.o. year old, female patient evaluated today after her last encounter by our practice on 01/22/2019. Jocelyn [name]  has a past  medical history of Arthritis of both knees (08/01/2015), Colitis, Essential hypertension, benign (07/28/2014), GERD (gastroesophageal reflux disease), iron deficiency anemia (09/02/2015), Hyperlipidemia, Hypertension, Lumbar pain, Medicare annual wellness visit, subsequent (04/07/2017), OSA on CPAP (08/01/2015), Overweight (BMI 25.0-29.9) (07/10/2013), Prediabetes (09/02/2015), and SOB (shortness of breath) (07/10/2013). She also  has a past surgical history that includes Cataract extraction; Abdominal hysterectomy; Cholecystectomy; Lumbar disc surgery; and Colonoscopy with propofol (N/A, 04/09/2018). Jocelyn [name] has a current medication list which includes the following prescription(s): aspirin ec, calcium citrate-vitamin d, clonidine, diclofenac sodium, famotidine, ferrous sulfate, isosorbide mononitrate, olmesartan-amlodipine-hctz, pravastatin, and hydrocodone-acetaminophen. She  reports that she has never smoked. She has never used smokeless tobacco. She reports that she does not drink alcohol or use drugs. Jocelyn [name] has No Known Allergies.   HPI  Today, she is being contacted for medication management.  Complaining of increased back pain States that she has been taking approx 2 tablets of Norco daily, sometimes 3 Will increase quantity to #60 which patient was on before with Dr Sanda Klein to help manage her pain Discussed bowel health and importance of utilizing stool softener   Pharmacotherapy Assessment  Analgesic: 12/13/2018  1   12/04/2018  Hydrocodone-Acetamin 5-325 MG  30.00  15 Bi Lat   [id number]   Nor (4618)   0  10.00 MME  Medicare   [place]     Monitoring: Pharmacotherapy: No side-effects or adverse reactions reported. [place] PMP: PDMP reviewed during this encounter.       Compliance: No problems identified. Effectiveness: Clinically acceptable. Plan: Refer to "POC".  UDS:  Summary  Date Value Ref Range Status  03/06/2018 FINAL  Final    Comment:     ==================================================================== TOXASSURE COMP DRUG ANALYSIS,UR ==================================================================== Test  Result       Flag       Units Drug Absent but Declared for Prescription Verification   Hydrocodone                    Not Detected UNEXPECTED ng/mg creat   Gabapentin                     Not Detected UNEXPECTED   Acetaminophen                  Not Detected UNEXPECTED    Acetaminophen, as indicated in the declared medication list, is    not always detected even when used as directed.   Diclofenac                     Not Detected UNEXPECTED    Topical diclofenac, as indicated in the declared medication list,    is not always detected even when used as directed.   Salicylate                     Not Detected UNEXPECTED    Aspirin, as indicated in the declared medication list, is not    always detected even when used as directed.   Clonidine                      Not Detected UNEXPECTED ==================================================================== Test                      Result    Flag   Units      Ref Range   Creatinine              41               mg/dL      >=20 ==================================================================== Declared Medications:  The flagging and interpretation on this report are based on the  following declared medications.  Unexpected results may arise from  inaccuracies in the declared medications.  **Note: The testing scope of this panel includes these medications:  Clonidine (Catapres)  Gabapentin (Neurontin)  Hydrocodone (Norco)  **Note: The testing scope of this panel does not include small to  moderate amounts of these reported medications:  Acetaminophen (Norco)  Aspirin (Aspirin 81)  Topical Diclofenac (Voltaren)  **Note: The testing scope of this panel does not include following  reported medications:  Albuterol  Amlodipine  Calcium  (Calcium/Vitamin D)  Famotidine (Pepcid)  Hydrochlorothiazide (HCTZ)  Iron (Ferrous Sulfate)  Isosorbide (Imdur)  Multivitamin (Centrum Silver)  Olmesartan  Potassium (K-Dur)  Pravastatin (Pravachol)  Supplement (Centrum Silver)  Vitamin D (Calcium/Vitamin D) ==================================================================== For clinical consultation, please call [phone]. ====================================================================    Laboratory Chemistry Profile (12 mo)  Renal: 08/01/2018: BUN 14; BUN/Creatinine Ratio NOT APPLICABLE; Creat [id number]  Lab Results  Component Value Date   GFRAA 78 08/01/2018   GFRNONAA 67 08/01/2018   Hepatic: No results found for requested labs within last 8760 hours. Lab Results  Component Value Date   AST 19 08/01/2018   ALT 11 08/01/2018   Other: No results found for requested labs within last 8760 hours. Note: Above Lab results reviewed.  Assessment  The primary encounter diagnosis was Lumbar facet arthropathy. Diagnoses of Lumbar degenerative disc disease, Chronic SI joint pain, Chronic pain syndrome, and SI joint arthritis were also pertinent to this visit.  Plan of Care   I am having Jocelyn Uhr.  Toy [name] start on HYDROcodone-acetaminophen. I am also having her maintain her aspirin EC, calcium citrate-vitamin D, isosorbide mononitrate, diclofenac sodium, famotidine, Olmesartan-amLODIPine-HCTZ, cloNIDine, ferrous sulfate, and pravastatin.  Pharmacotherapy (Medications Ordered): Meds ordered this encounter  Medications  . HYDROcodone-acetaminophen (NORCO/VICODIN) 5-325 MG tablet    Sig: Take 1 tablet by mouth 2 (two) times daily as needed for severe pain. Must last 30 days.    Dispense:  60 tablet    Refill:  0    Chronic Pain. (STOP Act - Not applicable). Fill one day early if closed on scheduled refill date.   Follow-up plan:   Return in about 4 weeks (around 02/20/2019) for Medication Management, virtual.     Recent  Visits Date Type Provider Dept  12/04/18 Office Visit Gillis Santa, MD [facility]  Showing recent visits within past 90 days and meeting all other requirements   Today's Visits Date Type Provider Dept  01/23/19 Office Visit Gillis Santa, MD [facility]  Showing today's visits and meeting all other requirements   Future Appointments No visits were found meeting these conditions.  Showing future appointments within next 90 days and meeting all other requirements   I discussed the assessment and treatment plan with the patient. The patient was provided an opportunity to ask questions and all were answered. The patient agreed with the plan and demonstrated an understanding of the instructions.  Patient advised to call back or seek an in-person evaluation if the symptoms or condition worsens.  Total duration of non-face-to-face encounter:21 minutes.  Note by: Gillis Santa, MD Date: 01/23/2019; Time: 3:40 PM

## 2019-02-19 ENCOUNTER — Telehealth: Payer: Self-pay

## 2019-02-19 ENCOUNTER — Encounter: Payer: Self-pay | Admitting: Internal Medicine

## 2019-02-19 NOTE — Telephone Encounter (Signed)
Attempted to call patient. Friend answered phone and stated she was at a Doctors appt. Message left to call us back .

## 2019-02-19 NOTE — Telephone Encounter (Signed)
Attempted to call patient., no answer. Left message on AM to call us back for informations for tomorrows VV

## 2019-02-20 ENCOUNTER — Encounter: Payer: Self-pay | Admitting: Student in an Organized Health Care Education/Training Program

## 2019-02-20 ENCOUNTER — Other Ambulatory Visit: Payer: Self-pay

## 2019-02-20 ENCOUNTER — Institutional Professional Consult (permissible substitution): Payer: Medicare Other | Admitting: Pulmonary Disease

## 2019-02-20 ENCOUNTER — Ambulatory Visit
Payer: Medicare Other | Attending: Student in an Organized Health Care Education/Training Program | Admitting: Student in an Organized Health Care Education/Training Program

## 2019-02-20 DIAGNOSIS — M533 Sacrococcygeal disorders, not elsewhere classified: Secondary | ICD-10-CM | POA: Diagnosis not present

## 2019-02-20 DIAGNOSIS — M47816 Spondylosis without myelopathy or radiculopathy, lumbar region: Secondary | ICD-10-CM | POA: Diagnosis not present

## 2019-02-20 DIAGNOSIS — M5136 Other intervertebral disc degeneration, lumbar region: Secondary | ICD-10-CM | POA: Diagnosis not present

## 2019-02-20 DIAGNOSIS — G894 Chronic pain syndrome: Secondary | ICD-10-CM

## 2019-02-20 DIAGNOSIS — G8929 Other chronic pain: Secondary | ICD-10-CM

## 2019-02-20 MED ORDER — HYDROCODONE-ACETAMINOPHEN 5-325 MG PO TABS: 1.0000 | ORAL_TABLET | Freq: Two times a day (BID) | ORAL | 0 refills | Status: AC | PRN

## 2019-02-20 MED ORDER — HYDROCODONE-ACETAMINOPHEN 5-325 MG PO TABS: 1.0000 | ORAL_TABLET | Freq: Two times a day (BID) | ORAL | 0 refills | Status: DC | PRN

## 2019-02-20 NOTE — Progress Notes (Signed)
Patient: Jocelyn [name]  Service Category: E/M  Provider: Gillis Santa, MD  DOB: 1931/06/06  DOS: 02/20/2019  Location: Office  MRN: [id number]  Setting: Ambulatory outpatient  Referring Provider: Hubbard Hartshorn, FNP  Type: Established Patient  Specialty: Interventional Pain Management  PCP: Hubbard Hartshorn, FNP  Location: Home  Delivery: TeleHealth     Virtual Encounter - Pain Management PROVIDER NOTE: Information contained herein reflects review and annotations entered in association with encounter. Interpretation of such information and data should be left to medically-trained personnel. Information provided to patient can be located elsewhere in the medical record under "Patient Instructions". Document created using STT-dictation technology, any transcriptional errors that may result from process are unintentional.    Contact & Pharmacy Preferred: [phone] Home: [phone] (home) Mobile: [phone] (mobile) E-mail: No e-mail address on record  [id number] [place], [place] - [address] [place] [address] Phone: [phone] Fax: [phone]   Pre-screening  Jocelyn [name] offered "in-person" vs "virtual" encounter. She indicated preferring virtual for this encounter.   Reason COVID-19*  Social distancing based on CDC and AMA recommendations.   I contacted Jocelyn [name] on 02/20/2019 via telephone.      I clearly identified myself as Gillis Santa, MD. I verified that I was speaking with the correct person using two identifiers (Name: Jocelyn [name], and date of birth: Dec 17, 1980).  This visit was completed via telephone due to the restrictions of the COVID-19 pandemic. All issues as above were discussed and addressed but no physical exam was performed. If it was felt that the patient should be evaluated in the office, they were directed there. The patient verbally consented to this visit. Patient was unable to complete an audio/visual visit due to  Technical difficulties and/or Lack of internet. Due to the catastrophic nature of the COVID-19 pandemic, this visit was done through audio contact only.  Location of the patient: home address (see Epic for details)  Location of the provider: office  Consent I sought verbal advanced consent from Jocelyn [name] for virtual visit interactions. I informed Jocelyn [name] of possible security and privacy concerns, risks, and limitations associated with providing "not-in-person" medical evaluation and management services. I also informed Jocelyn [name] of the availability of "in-person" appointments. Finally, I informed her that there would be a charge for the virtual visit and that she could be  personally, fully or partially, financially responsible for it. Jocelyn [name] expressed understanding and agreed to proceed.   Historic Elements   Jocelyn [name] is a 86 y.o. year old, female patient evaluated today after her last encounter by our practice on 02/19/2019. Jocelyn [name]  has a past medical history of Arthritis of both knees (08/01/2015), Colitis, Essential hypertension, benign (07/28/2014), GERD (gastroesophageal reflux disease), iron deficiency anemia (09/02/2015), Hyperlipidemia, Hypertension, Lumbar pain, Medicare annual wellness visit, subsequent (04/07/2017), OSA on CPAP (08/01/2015), Overweight (BMI 25.0-29.9) (07/10/2013), Prediabetes (09/02/2015), and SOB (shortness of breath) (07/10/2013). She also  has a past surgical history that includes Cataract extraction; Abdominal hysterectomy; Cholecystectomy; Lumbar disc surgery; and Colonoscopy with propofol (N/A, 04/09/2018). Jocelyn [name] has a current medication list which includes the following prescription(s): aspirin ec, calcium citrate-vitamin d, clonidine, diclofenac sodium, famotidine, ferrous sulfate, [START ON 02/22/2019] hydrocodone-acetaminophen, [START ON 03/24/2019] hydrocodone-acetaminophen, isosorbide mononitrate, olmesartan-amlodipine-hctz, and pravastatin. She   reports that she has never smoked. She has never used smokeless tobacco. She reports that she does not drink alcohol or use drugs. Jocelyn [name] has  No Known Allergies.   HPI  Today, she is being contacted for medication management.   No change in medical history since last visit.  Patient's pain is at baseline.  Patient continues multimodal pain regimen as prescribed.  States that it provides pain relief and improvement in functional status.  Pharmacotherapy Assessment  Analgesic: 01/23/2019  1   01/23/2019  Hydrocodone-Acetamin 5-325 MG  60.00  30 Bi Lat   [id number]   Nor (4618)   0  10.00 MME  Medicare   [place]   Monitoring: Pharmacotherapy: No side-effects or adverse reactions reported. [place] PMP: PDMP reviewed during this encounter.       Compliance: No problems identified. Effectiveness: Clinically acceptable. Plan: Refer to "POC".  UDS:  Summary  Date Value Ref Range Status  03/06/2018 FINAL  Final    Comment:    ==================================================================== TOXASSURE COMP DRUG ANALYSIS,UR ==================================================================== Test                             Result       Flag       Units Drug Absent but Declared for Prescription Verification   Hydrocodone                    Not Detected UNEXPECTED ng/mg creat   Gabapentin                     Not Detected UNEXPECTED   Acetaminophen                  Not Detected UNEXPECTED    Acetaminophen, as indicated in the declared medication list, is    not always detected even when used as directed.   Diclofenac                     Not Detected UNEXPECTED    Topical diclofenac, as indicated in the declared medication list,    is not always detected even when used as directed.   Salicylate                     Not Detected UNEXPECTED    Aspirin, as indicated in the declared medication list, is not    always detected even when used as directed.   Clonidine                      Not Detected  UNEXPECTED ==================================================================== Test                      Result    Flag   Units      Ref Range   Creatinine              41               mg/dL      >=20 ==================================================================== Declared Medications:  The flagging and interpretation on this report are based on the  following declared medications.  Unexpected results may arise from  inaccuracies in the declared medications.  **Note: The testing scope of this panel includes these medications:  Clonidine (Catapres)  Gabapentin (Neurontin)  Hydrocodone (Norco)  **Note: The testing scope of this panel does not include small to  moderate amounts of these reported medications:  Acetaminophen (Norco)  Aspirin (Aspirin 81)  Topical Diclofenac (Voltaren)  **Note: The testing scope of this panel does not include following  reported medications:  Albuterol  Amlodipine  Calcium (Calcium/Vitamin D)  Famotidine (Pepcid)  Hydrochlorothiazide (HCTZ)  Iron (Ferrous Sulfate)  Isosorbide (Imdur)  Multivitamin (Centrum Silver)  Olmesartan  Potassium (K-Dur)  Pravastatin (Pravachol)  Supplement (Centrum Silver)  Vitamin D (Calcium/Vitamin D) ==================================================================== For clinical consultation, please call [phone]. ====================================================================    Laboratory Chemistry Profile (12 mo)  Renal: 08/01/2018: BUN 14; BUN/Creatinine Ratio NOT APPLICABLE; Creat [id number]  Lab Results  Component Value Date   GFRAA 78 08/01/2018   GFRNONAA 67 08/01/2018   Hepatic: No results found for requested labs within last 8760 hours. Lab Results  Component Value Date   AST 19 08/01/2018   ALT 11 08/01/2018   Other: No results found for requested labs within last 8760 hours.  Note: Above Lab results reviewed.    Assessment  The primary encounter diagnosis was Lumbar facet  arthropathy. Diagnoses of Lumbar degenerative disc disease, Chronic SI joint pain, and Chronic pain syndrome were also pertinent to this visit.  Plan of Care  I am having Jocelyn [name]. Toy [name] start on HYDROcodone-acetaminophen. I am also having her maintain her aspirin EC, calcium citrate-vitamin D, isosorbide mononitrate, diclofenac sodium, famotidine, Olmesartan-amLODIPine-HCTZ, cloNIDine, ferrous sulfate, pravastatin, and HYDROcodone-acetaminophen.  Pharmacotherapy (Medications Ordered): Meds ordered this encounter  Medications  . HYDROcodone-acetaminophen (NORCO/VICODIN) 5-325 MG tablet    Sig: Take 1 tablet by mouth 2 (two) times daily as needed for severe pain. Must last 30 days.    Dispense:  60 tablet    Refill:  0    Chronic Pain. (STOP Act - Not applicable). Fill one day early if closed on scheduled refill date.  Marland Kitchen HYDROcodone-acetaminophen (NORCO/VICODIN) 5-325 MG tablet    Sig: Take 1 tablet by mouth 2 (two) times daily as needed for severe pain. Must last 30 days.    Dispense:  60 tablet    Refill:  0    Chronic Pain. (STOP Act - Not applicable). Fill one day early if closed on scheduled refill date.   Follow-up plan:   Return in about 8 weeks (around 04/17/2019) for Medication Management, virtual.    Recent Visits Date Type Provider Dept  01/23/19 Office Visit Gillis Santa, MD [facility]  12/04/18 Office Visit Gillis Santa, MD [facility]  Showing recent visits within past 90 days and meeting all other requirements   Today's Visits Date Type Provider Dept  02/20/19 Office Visit Gillis Santa, MD [facility]  Showing today's visits and meeting all other requirements   Future Appointments No visits were found meeting these conditions.  Showing future appointments within next 90 days and meeting all other requirements   I discussed the assessment and treatment plan with the patient. The patient was provided an opportunity to ask questions  and all were answered. The patient agreed with the plan and demonstrated an understanding of the instructions.  Patient advised to call back or seek an in-person evaluation if the symptoms or condition worsens.  Duration of encounter: 30 minutes.  Note by: Gillis Santa, MD Date: 02/20/2019; Time: 2:26 PM

## 2019-03-14 ENCOUNTER — Institutional Professional Consult (permissible substitution): Payer: Medicare Other | Admitting: Internal Medicine

## 2019-03-16 ENCOUNTER — Other Ambulatory Visit: Payer: Self-pay | Admitting: Family Medicine

## 2019-03-16 NOTE — Telephone Encounter (Signed)
Requested Prescriptions  Pending Prescriptions Disp Refills  . pravastatin (PRAVACHOL) 20 MG tablet [Pharmacy Med Name: PRAVASTATIN SODIUM 20 MG TAB] 26 tablet 1    Sig: TAKE 1 TABLET BY MOUTH EVERY DAY     Cardiovascular:  Antilipid - Statins Failed - 03/16/2019  9:33 AM      Failed - Total Cholesterol in normal range and within 360 days    Cholesterol, Total  Date Value Ref Range Status  07/08/2015 166 100 - 199 mg/dL Final   Cholesterol  Date Value Ref Range Status  08/01/2018 203 (H) <200 mg/dL Final         Failed - LDL in normal range and within 360 days    LDL Cholesterol (Calc)  Date Value Ref Range Status  08/01/2018 120 (H) mg/dL (calc) Final    Comment:    Reference range: <100 . Desirable range <100 mg/dL for primary prevention;   <70 mg/dL for patients with CHD or diabetic patients  with > or = 2 CHD risk factors. Marland Kitchen LDL-C is now calculated using the Martin-Hopkins  calculation, which is a validated novel method providing  better accuracy than the Friedewald equation in the  estimation of LDL-C.  Cresenciano Genre et al. Annamaria Helling. MU:[id number]): 2061-2068  (http://education.[facility].com/faq/FAQ164)          Passed - HDL in normal range and within 360 days    HDL  Date Value Ref Range Status  08/01/2018 65 > OR = 50 mg/dL Final  07/08/2015 60 >39 mg/dL Final         Passed - Triglycerides in normal range and within 360 days    Triglycerides  Date Value Ref Range Status  08/01/2018 84 <150 mg/dL Final         Passed - Patient is not pregnant      Passed - Valid encounter within last 12 months    Recent Outpatient Visits          3 months ago Essential hypertension, benign   [place], [place], FNP   7 months ago Essential hypertension, benign   [place], [place], NP   9 months ago Fall, subsequent encounter   [place], Satira Anis, MD   1 year ago Abnormal  colonoscopy   [place], Satira Anis, MD   1 year ago Change in stool   [place], Satira Anis, MD      Future Appointments            In 3 months Hubbard Hartshorn, [facility], [facility]

## 2019-03-27 ENCOUNTER — Telehealth: Payer: Self-pay | Admitting: Internal Medicine

## 2019-03-27 NOTE — Telephone Encounter (Signed)
Spoke with pts daughter Roland Rack [facility]) and we have rescheduled pt for 3/17 with NP Eric Form at 1030am. Asked pt to arrive at 1015am. She verbalized her understanding and nothing further needed.

## 2019-04-15 ENCOUNTER — Telehealth: Payer: Self-pay

## 2019-04-15 ENCOUNTER — Encounter: Payer: Self-pay | Admitting: Student in an Organized Health Care Education/Training Program

## 2019-04-15 ENCOUNTER — Telehealth: Payer: Self-pay | Admitting: Student in an Organized Health Care Education/Training Program

## 2019-04-15 NOTE — Telephone Encounter (Signed)
LM for patient to call office for pre virtual appointment questions.  

## 2019-04-15 NOTE — Telephone Encounter (Signed)
Left 2nd message to return our call.

## 2019-04-15 NOTE — Telephone Encounter (Signed)
Nurse please use [phone] to contact [name]. She is scheduled for virtual appt tomorrow

## 2019-04-16 ENCOUNTER — Encounter: Payer: Self-pay | Admitting: Student in an Organized Health Care Education/Training Program

## 2019-04-16 ENCOUNTER — Ambulatory Visit
Payer: Medicare Other | Attending: Student in an Organized Health Care Education/Training Program | Admitting: Student in an Organized Health Care Education/Training Program

## 2019-04-16 ENCOUNTER — Other Ambulatory Visit: Payer: Self-pay

## 2019-04-16 DIAGNOSIS — M47818 Spondylosis without myelopathy or radiculopathy, sacral and sacrococcygeal region: Secondary | ICD-10-CM

## 2019-04-16 DIAGNOSIS — M47816 Spondylosis without myelopathy or radiculopathy, lumbar region: Secondary | ICD-10-CM

## 2019-04-16 DIAGNOSIS — M533 Sacrococcygeal disorders, not elsewhere classified: Secondary | ICD-10-CM

## 2019-04-16 DIAGNOSIS — G894 Chronic pain syndrome: Secondary | ICD-10-CM | POA: Diagnosis not present

## 2019-04-16 DIAGNOSIS — M5136 Other intervertebral disc degeneration, lumbar region: Secondary | ICD-10-CM

## 2019-04-16 DIAGNOSIS — G8929 Other chronic pain: Secondary | ICD-10-CM

## 2019-04-16 DIAGNOSIS — M170 Bilateral primary osteoarthritis of knee: Secondary | ICD-10-CM

## 2019-04-16 MED ORDER — HYDROCODONE-ACETAMINOPHEN 5-325 MG PO TABS: 1.0000 | ORAL_TABLET | Freq: Two times a day (BID) | ORAL | 0 refills | Status: DC | PRN

## 2019-04-16 NOTE — Progress Notes (Signed)
Patient: Jocelyn [name]  Service Category: E/M  Provider: Gillis Santa, MD  DOB: [birth year]  DOS: 04/16/2019  Location: Office  MRN: [id number]  Setting: Ambulatory outpatient  Referring Provider: Hubbard Hartshorn, FNP  Type: Established Patient  Specialty: Interventional Pain Management  PCP: Hubbard Hartshorn, FNP  Location: Home  Delivery: TeleHealth     Virtual Encounter - Pain Management PROVIDER NOTE: Information contained herein reflects review and annotations entered in association with encounter. Interpretation of such information and data should be left to medically-trained personnel. Information provided to patient can be located elsewhere in the medical record under "Patient Instructions". Document created using STT-dictation technology, any transcriptional errors that may result from process are unintentional.    Contact & Pharmacy Preferred: [phone] Home: [phone] (home) Mobile: [phone] (mobile) E-mail: No e-mail address on record  [id number] [place] [address]: [phone]: [phone]  Pre-screening  [name] "in-person" vs "virtual" encounter. She indicated preferring virtual for this encounter.   Reason COVID-19*  Social distancing based on CDC and AMA recommendations.   I contacted Jocelyn [name] 04/16/2019 via telephone.      I clearly identified myself as BGillis Santa MD. I verified that I was speaking with the correct person using two identifiers (Name: Jocelyn [name] and date of birth: [id number].  This visit was completed via telephone due to the restrictions of the COVID-19 pandemic. All issues as above were discussed and addressed but no physical exam was performed. If it was felt that the patient should be evaluated in the office, they were directed there. The patient verbally consented to this visit. Patient was unable to complete an audio/visual visit due to  Technical difficulties and/or Lack of internet. Due to the catastrophic nature of the COVID-19 pandemic, this visit was done through audio contact only.  Location of the patient: home address (see Epic for details)  Location of the provider: office  Consent I sought verbal advanced consent from Jocelyn [name] virtual visit interactions. I informed [name] possible security and privacy concerns, risks, and limitations associated with providing "not-in-person" medical evaluation and management services. I also informed [name] the availability of "in-person" appointments. Finally, I informed her that there would be a charge for the virtual visit and that she could be  personally, fully or partially, financially responsible for it. [name] and agreed to proceed.   Historic Elements   Ms. FDOROTA HEINRICHSis a 86y.o. year old, female patient evaluated today after her last contact with our practice on 04/15/2019. Jocelyn [name] has a past medical history of Arthritis of both knees (08/01/2015), Colitis, Essential hypertension, benign (07/28/2014), GERD (gastroesophageal reflux disease), iron deficiency anemia (09/02/2015), Hyperlipidemia, Hypertension, Lumbar pain, Medicare annual wellness visit, subsequent (04/07/2017), OSA on CPAP (08/01/2015), Overweight (BMI 25.0-29.9) (07/10/2013), Prediabetes (09/02/2015), and SOB (shortness of breath) (07/10/2013). She also  has a past surgical history that includes Cataract extraction; Abdominal hysterectomy; Cholecystectomy; Lumbar disc surgery; and Colonoscopy with propofol (N/A, 04/09/2018). Ms. FAuberthas a current medication list which includes the following prescription(s): aspirin ec, calcium citrate-vitamin d, clonidine, diclofenac sodium, famotidine, ferrous sulfate, [START ON 05/13/2019] hydrocodone-acetaminophen, [START ON 06/12/2019] hydrocodone-acetaminophen, isosorbide mononitrate, olmesartan-amlodipine-hctz, omeprazole,  pravastatin, and diclofenac sodium. She  reports that she has never smoked. She has never used smokeless tobacco. She reports that she does not drink alcohol or use drugs.  [name] has No Known Allergies.   HPI  Today, she is being contacted for medication management.   No change in medical history since last visit.  Patient's pain is at baseline other than mild-moderate knee pain bilateral but is worse with weightbearing in the evening.  She has been prescribed Voltaren gel which she states is helpful.  Denies constipation, cognitive changes, itchiness.  Patient continues multimodal pain regimen as prescribed.  States that it provides pain relief and improvement in functional status.  Pharmacotherapy Assessment  Analgesic:  04/12/2019  1   02/20/2019  Hydrocodone-Acetamin 5-325 MG  60.00  30 Bi Lat   [id number]   Nor (4618)   0  10.00 MME  Medicare   [place]     Monitoring:  PMP: PDMP reviewed during this encounter.       Pharmacotherapy: No side-effects or adverse reactions reported. Compliance: No problems identified. Effectiveness: Clinically acceptable. Plan: Refer to "POC".  UDS:  Summary  Date Value Ref Range Status  03/06/2018 FINAL  Final    Comment:    ==================================================================== TOXASSURE COMP DRUG ANALYSIS,UR ==================================================================== Test                             Result       Flag       Units Drug Absent but Declared for Prescription Verification   Hydrocodone                    Not Detected UNEXPECTED ng/mg creat   Gabapentin                     Not Detected UNEXPECTED   Acetaminophen                  Not Detected UNEXPECTED    Acetaminophen, as indicated in the declared medication list, is    not always detected even when used as directed.   Diclofenac                     Not Detected UNEXPECTED    Topical diclofenac, as indicated in the declared medication list,    is not always  detected even when used as directed.   Salicylate                     Not Detected UNEXPECTED    Aspirin, as indicated in the declared medication list, is not    always detected even when used as directed.   Clonidine                      Not Detected UNEXPECTED ==================================================================== Test                      Result    Flag   Units      Ref Range   Creatinine              41               mg/dL      >=20 ==================================================================== Declared Medications:  The flagging and interpretation on this report are based on the  following declared medications.  Unexpected results may arise from  inaccuracies in the declared medications.  **Note: The testing scope of this panel includes these medications:  Clonidine (Catapres)  Gabapentin (Neurontin)  Hydrocodone (Norco)  **Note: The testing scope of  this panel does not include small to  moderate amounts of these reported medications:  Acetaminophen (Norco)  Aspirin (Aspirin 81)  Topical Diclofenac (Voltaren)  **Note: The testing scope of this panel does not include following  reported medications:  Albuterol  Amlodipine  Calcium (Calcium/Vitamin D)  Famotidine (Pepcid)  Hydrochlorothiazide (HCTZ)  Iron (Ferrous Sulfate)  Isosorbide (Imdur)  Multivitamin (Centrum Silver)  Olmesartan  Potassium (K-Dur)  Pravastatin (Pravachol)  Supplement (Centrum Silver)  Vitamin D (Calcium/Vitamin D) ==================================================================== For clinical consultation, please call [phone]. ====================================================================    Laboratory Chemistry Profile   Renal Lab Results  Component Value Date   BUN 14 08/01/2018   CREATININE 0.80 62/95/2841   BCR NOT APPLICABLE 32/44/0102   GFRAA 78 08/01/2018   GFRNONAA 67 08/01/2018    Hepatic Lab Results  Component Value Date   AST 19 08/01/2018    ALT 11 08/01/2018   ALBUMIN 4.1 07/07/2016   ALKPHOS 49 07/07/2016    Electrolytes Lab Results  Component Value Date   NA 140 08/01/2018   K 3.9 08/01/2018   CL 103 08/01/2018   CALCIUM 10.0 08/01/2018    Bone No results found for: VD25OH, VD125OH2TOT, VO5366YQ0, HK7425ZD6, 25OHVITD1, 25OHVITD2, 25OHVITD3, TESTOFREE, TESTOSTERONE  Inflammation (CRP: Acute Phase) (ESR: Chronic Phase) No results found for: CRP, ESRSEDRATE, LATICACIDVEN    Note: Above Lab results reviewed.  Assessment  The primary encounter diagnosis was Lumbar facet arthropathy. Diagnoses of Chronic pain syndrome, Lumbar degenerative disc disease, Chronic SI joint pain, SI joint arthritis, and Bilateral primary osteoarthritis of knee were also pertinent to this visit.  Plan of Care  Ms. TRACY GERKEN has a current medication list which includes the following long-term medication(s): calcium citrate-vitamin d, clonidine, famotidine, ferrous sulfate, isosorbide mononitrate, olmesartan-amlodipine-hctz, and pravastatin.  Pharmacotherapy (Medications Ordered): Meds ordered this encounter  Medications  . HYDROcodone-acetaminophen (NORCO/VICODIN) 5-325 MG tablet    Sig: Take 1 tablet by mouth 2 (two) times daily as needed for severe pain. Must last 30 days.    Dispense:  60 tablet    Refill:  0    Chronic Pain. (STOP Act - Not applicable). Fill one day early if closed on scheduled refill date.  Marland Kitchen HYDROcodone-acetaminophen (NORCO/VICODIN) 5-325 MG tablet    Sig: Take 1 tablet by mouth 2 (two) times daily as needed for severe pain. Must last 30 days.    Dispense:  60 tablet    Refill:  0    Chronic Pain. (STOP Act - Not applicable). Fill one day early if closed on scheduled refill date.   Orders:  Orders Placed This Encounter  Procedures  . GENICULAR NERVE BLOCK    For knee pain.    Standing Status:   Standing    Number of Occurrences:   1    Standing Expiration Date:   04/15/2020    Scheduling Instructions:      Side(s): Bilateral Knee     Level(s): Superior-Lateral, Superior-Medial, and Inferior-Medial Genicular Nerves     Sedation: Without  Sedation.     TIMEFRAME: PRN procedure. (Ms. Trouten will call when needed.)    Order Specific Question:   Where will this procedure be performed?    Answer:   [facility]   Follow-up plan:   Return in about 3 months (around 07/13/2019) for Medication Management.    Recent Visits Date Type Provider Dept  02/20/19 Office Visit Gillis Santa, MD [facility]  01/23/19 Office Visit Gillis Santa, MD [facility]  Showing  recent visits within past 90 days and meeting all other requirements   Today's Visits Date Type Provider Dept  04/16/19 Office Visit Gillis Santa, MD [facility]  Showing today's visits and meeting all other requirements   Future Appointments No visits were found meeting these conditions.  Showing future appointments within next 90 days and meeting all other requirements   I discussed the assessment and treatment plan with the patient. The patient was provided an opportunity to ask questions and all were answered. The patient agreed with the plan and demonstrated an [name] of the instructions.  Patient advised to call back or seek an in-person evaluation if the symptoms or condition worsens.  Duration of encounter: 38mnutes.  Note by: BGillis Santa MD Date: 04/16/2019; Time: 1:53 PM

## 2019-04-17 ENCOUNTER — Encounter: Payer: Self-pay | Admitting: Acute Care

## 2019-04-17 ENCOUNTER — Ambulatory Visit (INDEPENDENT_AMBULATORY_CARE_PROVIDER_SITE_OTHER): Payer: Medicare Other | Admitting: Acute Care

## 2019-04-17 ENCOUNTER — Other Ambulatory Visit: Payer: Self-pay

## 2019-04-17 VITALS — BP 144/80 | HR 75 | Temp 98.4°F | Ht 63.0 in | Wt 163.0 lb

## 2019-04-17 DIAGNOSIS — Z9989 Dependence on other enabling machines and devices: Secondary | ICD-10-CM | POA: Diagnosis not present

## 2019-04-17 DIAGNOSIS — Z0000 Encounter for general adult medical examination without abnormal findings: Secondary | ICD-10-CM | POA: Diagnosis not present

## 2019-04-17 DIAGNOSIS — G4733 Obstructive sleep apnea (adult) (pediatric): Secondary | ICD-10-CM | POA: Diagnosis not present

## 2019-04-17 NOTE — Progress Notes (Signed)
History of Present Illness Jocelyn [name] is a 86 y.o. female with past medical history of Colitis, Essential hypertension, benign (07/28/2014), GERD (gastroesophageal reflux disease), iron deficiency anemia (09/02/2015), Hyperlipidemia, Hypertension, Lumbar pain, and  OSA on CPAP . She will be followed by Dr. Rich Number.  Synopsis Pt had been on CPAP in the past, but had stopped use due to machine malfunction or it being too loud.. She presented to see Dr. Ashby Dawes 05/24/2019 and he did a sleep study at that time. It was positive for OSA . Therapy was resumed on Auto Set 8-18 cm H2O.  She is compliant but down load shows AHI of 10.8 with mask leak. She needs new equipment.  She denies fever, chest pain, orthopnea, or hemoptysis.   04/17/2019 follow-up for obstructive sleep apnea and CPAP use Pt. Presents for follow up. She states he has been using her CPAP most nights. Down Load shows 25/30 days. ( 83%). She states she has been doing well on her CPAP. She uses it most nights. She does not have any daytime sleepiness as she spends her days helping to take care of her daughter.She has a daughter who is paralyzed and she spends her days at her house. She states she knows she has a mask leak, and that she needs new equipment for her device. She denies any issues with her device. She is in agreement to having a follow up in 6 weeks to evaluate AHI once  she has received new equipment. She is taking Covid Precautions. She has had both of her Covid vaccines.She denies any other health issues at this time.  Test Results: Download 03/17/2019 through 04/15/2019 Air sense 10 AutoSet Pressures 8 cm H2O to 18 cm H2O Usage days 25 of 30 or 83% Greater than 4 hours 21 days or 70% Less than 4 hours 4 days or 13% Average usage days used 8 hours 8 minutes Median pressure 11.6 cm H2O AHI 10.8 Median leaks 48.6    CBC Latest Ref Rng & Units 08/01/2018 02/07/2018 01/11/2018  WBC 3.8 - 10.8 Thousand/uL 8.6 8.2 6.7    Hemoglobin 11.7 - 15.5 g/dL 11.5(L) 10.1(L) 10.1(L)  Hematocrit 35.0 - 45.0 % 35.1 31.7(L) 31.1(L)  Platelets 140 - 400 Thousand/uL 192 211 315    BMP Latest Ref Rng & Units 08/01/2018 02/07/2018 01/11/2018  Glucose 65 - 99 mg/dL 87 90 96  BUN 7 - 25 mg/dL 14 16 18   Creatinine 0.60 - 0.88 mg/dL 0.80 0.68 0.76  BUN/Creat Ratio 6 - 22 (calc) NOT APPLICABLE NOT APPLICABLE NOT APPLICABLE  Sodium A999333 - 146 mmol/L 140 141 141  Potassium 3.5 - 5.3 mmol/L 3.9 3.4(L) 3.2(L)  Chloride 98 - 110 mmol/L 103 103 104  CO2 20 - 32 mmol/L 28 30 27   Calcium 8.6 - 10.4 mg/dL 10.0 9.5 9.1    BNP No results found for: BNP  ProBNP No results found for: PROBNP  PFT No results found for: FEV1PRE, FEV1POST, FVCPRE, FVCPOST, TLC, DLCOUNC, PREFEV1FVCRT, PSTFEV1FVCRT  No results found.   Past medical hx Past Medical History:  Diagnosis Date  . Arthritis of both knees 08/01/2015  . Colitis   . Essential hypertension, benign 07/28/2014  . GERD (gastroesophageal reflux disease)   . Hx of iron deficiency anemia 09/02/2015  . Hyperlipidemia   . Hypertension   . Lumbar pain   . Medicare annual wellness visit, subsequent 04/07/2017  . OSA on CPAP 08/01/2015  . Overweight (BMI 25.0-29.9) 07/10/2013  . Prediabetes 09/02/2015  .  SOB (shortness of breath) 07/10/2013     Social History   Tobacco Use  . Smoking status: Never Smoker  . Smokeless tobacco: Never Used  Substance Use Topics  . Alcohol use: No    Alcohol/week: 0.0 standard drinks  . Drug use: No    Ms.Packer reports that she has never smoked. She has never used smokeless tobacco. She reports that she does not drink alcohol or use drugs.  Tobacco Cessation: Never smoker  Past surgical hx, Family hx, Social hx all reviewed.  Current Outpatient Medications on File Prior to Visit  Medication Sig  . aspirin EC 81 MG tablet Take 1 tablet (81 mg total) by mouth daily.  . calcium citrate-vitamin D 500-400 MG-UNIT chewable tablet Chew 1 tablet by mouth  2 (two) times daily.   . cloNIDine (CATAPRES) 0.1 MG tablet Take 1 tablet (0.1 mg total) by mouth 2 (two) times daily.  . diclofenac sodium (VOLTAREN) 1 % GEL Apply 4 g topically 4 (four) times daily as needed. On the knees  . diclofenac Sodium (VOLTAREN) 1 % GEL   . ferrous sulfate 325 (65 FE) MG tablet Take 1 tablet (325 mg total) by mouth daily.  Derrill Memo ON 06/12/2019] HYDROcodone-acetaminophen (NORCO/VICODIN) 5-325 MG tablet Take 1 tablet by mouth 2 (two) times daily as needed for severe pain. Must last 30 days.  . isosorbide mononitrate (IMDUR) 120 MG 24 hr tablet TAKE 1 TABLET BY MOUTH EVERY DAY  . Olmesartan-amLODIPine-HCTZ 40-10-12.5 MG TABS Take 1 tablet by mouth daily. For blood pressure  . omeprazole (PRILOSEC) 20 MG capsule Take 20 mg by mouth daily.  . pravastatin (PRAVACHOL) 20 MG tablet TAKE 1 TABLET BY MOUTH EVERY DAY   No current facility-administered medications on file prior to visit.     No Known Allergies  Review Of Systems:  Constitutional:   No  weight loss, night sweats,  Fevers, chills, fatigue, or  lassitude.  HEENT:   No headaches,  Difficulty swallowing,  Tooth/dental problems, or  Sore throat,                No sneezing, itching, ear ache, nasal congestion, post nasal drip,   CV:  No chest pain,  Orthopnea, PND, swelling in lower extremities, anasarca, dizziness, palpitations, syncope.   GI  No heartburn, indigestion, abdominal pain, nausea, vomiting, diarrhea, change in bowel habits, loss of appetite, bloody stools.   Resp: No shortness of breath with exertion or at rest.  No excess mucus, no productive cough,  No non-productive cough,  No coughing up of blood.  No change in color of mucus.  No wheezing.  No chest wall deformity  Skin: no rash or lesions.  GU: no dysuria, change in color of urine, no urgency or frequency.  No flank pain, no hematuria   MS:  No joint pain or swelling.  No decreased range of motion.  No back pain.  Psych:  No change in  mood or affect. No depression or anxiety.  No memory loss.   Vital Signs BP (!) 144/80 (BP Location: Left Arm, Cuff Size: Normal)   Pulse 75   Temp 98.4 F (36.9 C) (Temporal)   Ht 5\' 3"  (1.6 m)   Wt 163 lb (73.9 kg)   SpO2 95% Comment: on RA  BMI 28.87 kg/m    Physical Exam:  General- No distress,  A&Ox3, appropriate and pleasant ENT: No sinus tenderness, TM clear, pale nasal mucosa, no oral exudate,no post nasal drip, no LAN Cardiac:  S1, S2, regular rate and rhythm, no murmur Chest: No wheeze/ rales/ dullness; no accessory muscle use, no nasal flaring, no sternal retractions, diminished per bases Abd.: Soft Non-tender, nondistended, bowel sounds positive,Body mass index is 28.87 kg/m. Ext: No clubbing cyanosis, edema Neuro:  normal strength, moving all extremities x4, alert and oriented x3. Skin: No rashes, no lesions, warm and dry Psych: normal mood and behavior   Assessment/Plan OSA on CPAP Compliant with use AHI of 10.8 secondary to significant leak around mask Patient needs new equipment Plan We will place an order for new equipment for your CPAP machine. Continue on CPAP at bedtime. You appear to be benefiting from the treatment  Goal is to wear for at least 6 hours each night for maximal clinical benefit. Continue to work on weight loss, as the link between excess weight  and sleep apnea is well established.   Remember to establish a good bedtime routine, and work on sleep hygiene.  Limit daytime naps , avoid stimulants such as caffeine and nicotine close to bedtime, exercise daily to promote sleep quality, avoid heavy , spicy, fried , or rich foods before bed. Ensure adequate exposure to natural light during the day,establish a relaxing bedtime routine with a pleasant sleep environment ( Bedroom between 60 and 67 degrees, turn off bright lights , TV or device screens screens , consider black out curtains or white noise machines) Do not drive if sleepy. Remember to  clean mask, tubing, filter, and reservoir once weekly with soapy water.  Follow up with Judson Roch NP ( Televisit)   In 6 weeks with down Load.>> Number to call for Tele visit is [phone] Congratulations on having received your Covid Vaccine. Please contact office for sooner follow up if symptoms do not improve or worsen or seek emergency care    Healthcare maintenance Congratulations on getting your Covid vaccine Continue to practice social distancing and masking and follow the regulations set forth by the CDC for your state  This appointment was 30 min long with over 50% of the time in direct face-to-face patient care, assessment, plan of care, and follow-up.    Magdalen Spatz, NP 04/17/2019  8:15 PM

## 2019-04-17 NOTE — Patient Instructions (Addendum)
It is nice to meet you today. We will place an order for new equipment for your CPAP machine. Continue on CPAP at bedtime. You appear to be benefiting from the treatment  Goal is to wear for at least 6 hours each night for maximal clinical benefit. Continue to work on weight loss, as the link between excess weight  and sleep apnea is well established.   Remember to establish a good bedtime routine, and work on sleep hygiene.  Limit daytime naps , avoid stimulants such as caffeine and nicotine close to bedtime, exercise daily to promote sleep quality, avoid heavy , spicy, fried , or rich foods before bed. Ensure adequate exposure to natural light during the day,establish a relaxing bedtime routine with a pleasant sleep environment ( Bedroom between 60 and 67 degrees, turn off bright lights , TV or device screens screens , consider black out curtains or white noise machines) Do not drive if sleepy. Remember to clean mask, tubing, filter, and reservoir once weekly with soapy water.  Follow up with Judson Roch NP ( Televisit)   In 6 weeks with down Load.>> Number to call for Tele visit is [phone] Congratulations on having received your Covid Vaccine. Please contact office for sooner follow up if symptoms do not improve or worsen or seek emergency care

## 2019-05-02 ENCOUNTER — Other Ambulatory Visit: Payer: Self-pay | Admitting: Family Medicine

## 2019-05-20 ENCOUNTER — Ambulatory Visit (INDEPENDENT_AMBULATORY_CARE_PROVIDER_SITE_OTHER): Payer: Medicare Other | Admitting: Acute Care

## 2019-05-20 ENCOUNTER — Encounter: Payer: Self-pay | Admitting: Acute Care

## 2019-05-20 DIAGNOSIS — Z9989 Dependence on other enabling machines and devices: Secondary | ICD-10-CM | POA: Diagnosis not present

## 2019-05-20 DIAGNOSIS — G4733 Obstructive sleep apnea (adult) (pediatric): Secondary | ICD-10-CM

## 2019-05-20 NOTE — Patient Instructions (Signed)
It was good to talk with you today. You are doing a great job on your CPAP machine.  You are wearing it every night.  I am sorry you did not receive your new equipment for your CPAP machine that we requested. We will place an order again for new equipment ( face mask in particular). Please call the office if you do not get a call from your DME to get your new equipment. We need to know so that we can continue to assist you in making sure you get what you need.  Continue on CPAP at bedtime. You appear to be benefiting from the treatment  Goal is to wear for at least 6 hours each night for maximal clinical benefit. Continue to work on weight loss, as the link between excess weight  and sleep apnea is well established.   Remember to establish a good bedtime routine, and work on sleep hygiene.  Limit daytime naps , avoid stimulants such as caffeine and nicotine close to bedtime, exercise daily to promote sleep quality, avoid heavy , spicy, fried , or rich foods before bed. Ensure adequate exposure to natural light during the day,establish a relaxing bedtime routine with a pleasant sleep environment ( Bedroom between 60 and 67 degrees, turn off bright lights , TV or device screens screens , consider black out curtains or white noise machines) Do not drive if sleepy. Remember to clean mask, tubing, filter, and reservoir once weekly with soapy water.  Follow up tele visit with Dr. Halford Chessman   on 07/04/2019 at 12 noon  or before as needed.  Please contact office for sooner follow up if symptoms do not improve or worsen or seek emergency care

## 2019-05-20 NOTE — Addendum Note (Signed)
Addended by: Hildred Alamin I on: 05/20/2019 01:01 PM   Modules accepted: Orders

## 2019-05-20 NOTE — Progress Notes (Addendum)
Virtual Visit via Video Note  I connected with Jocelyn [name] on 05/20/19 at  9:00 AM EDT by a video enabled telemedicine application and verified that I am speaking with the correct person using two identifiers.  Location: Patient: At Home Provider: [place], [place], [address]   I discussed the limitations of evaluation and management by telemedicine and the availability of in person appointments. The patient expressed understanding and agreed to proceed.   Synopsis Jocelyn [name] is a 86 y.o. female with past medical history of Colitis, Essential hypertension, benign (07/28/2014), GERD (gastroesophageal reflux disease), iron deficiency anemia (09/02/2015), Hyperlipidemia, Hypertension, Lumbar pain, and  OSA on CPAP . She will be followed by Dr. Rich Number.   History of Present Illness: Pt. Presents for follow up. She was last seen 04/17/2019. At the time her down Load revealed AHI of 10.8 with significant mask leak. ( Median  Leak of 48.6) We ordered new equipment. She is presenting telephonically  for follow up with down load to assess her AHI with improved mask seal.  Pt. States she has been doing well. She states she did not get a new face mask. She did not call the office to let anyone know. She does still have some daytime sleepiness. She states this is not every day. She states she has occasional awakening at night, but usually to use the restroom. She is compliant with therapy. She wears her CPAP every night. She thinks her DME is [facility], but is a bit confused about this. She denies any am headaches. She has had her Covid Vaccine.   Addendum After a great deal of research, patient does have [facility] as her DME. They apparently requested that she send her machine back for non-compliance. She has kept the machine and she is using it every night. Per [facility], she needs a CPAP titration study done to allow her to keep her CPAP for therapy. She has been called by the office here  to appraise her of the above. An order has been placed for the study. Once this has been done, we can order the new equipment that she needs.    Observations/Objective: Auto Set 8-18 cm H2O Median pressure 11.7/ Max 17.5 cm H2) Down Load 04/20/2019-05/19/2019 Usage 30/30 days ( 100%) > 4 hours 30 days Average usage 6 hours 14 minutes  SPIROMETRY: FVC was 2.27 liters, 130% of predicted FEV1 was 1.52, 110% of predicted FEV1 ratio was 67 FEF 25-75% liters per second was 59% of predicted  LUNG VOLUMES: TLC was 86% of predicted RV was 64% of predicted  DIFFUSION CAPACITY: DLCO was 91% of predicted DLCO/VA was 125% of predicted  FLOW VOLUME LOOP: C/w obstruction   Impression Moderate obstruction Lung volumes are in normal range Diffusion capacity is in normal range   Assessment and Plan: OSA Continues to have mask seal issues/ leaks Needs new equipment>> ordered 3/17, but patient never received a call or equipment. Plan We will call your DME to see why you did not get the new equipment that was ordered.  Please call the office if you do not hear from your DME regarding getting you a new mask for your CPAP machine. We want to make sure you get what you need.  Continue on CPAP at bedtime. You appear to be benefiting from the treatment  Goal is to wear for at least 6 hours each night for maximal clinical benefit. Continue to work on weight loss, as the link between excess weight  and sleep apnea is well established.   Remember to establish a good bedtime routine, and work on sleep hygiene.  Limit daytime naps , avoid stimulants such as caffeine and nicotine close to bedtime, exercise daily to promote sleep quality, avoid heavy , spicy, fried , or rich foods before bed. Ensure adequate exposure to natural light during the day,establish a relaxing bedtime routine with a pleasant sleep environment ( Bedroom between 60 and 67 degrees, turn off bright lights , TV or device screens screens  , consider black out curtains or white noise machines) Do not drive if sleepy. Remember to clean mask, tubing, filter, and reservoir once weekly with soapy water.  Follow up with Dr. Mortimer Fries  In 6 months  or before as needed.    Addendum: P   Follow Up Instructions: Follow up with Dr.Sood 07/04/2019 at 12 noon or before as needed.     I discussed the assessment and treatment plan with the patient. The patient was provided an opportunity to ask questions and all were answered. The patient agreed with the plan and demonstrated an understanding of the instructions.   The patient was advised to call back or seek an in-person evaluation if the symptoms worsen or if the condition fails to improve as anticipated.  I provided 22 minutes of non-face-to-face time during this encounter.   Magdalen Spatz, NP 05/20/2019 9:34 AM

## 2019-05-27 ENCOUNTER — Other Ambulatory Visit: Payer: Self-pay | Admitting: Family Medicine

## 2019-05-27 NOTE — Telephone Encounter (Signed)
Requested medication (s) are due for refill today: no  Requested medication (s) are on the active medication list: no  Last refill:  discontinued on 04/17/19  Future visit scheduled: yes  Notes to clinic:  Please review for refill. Medication discontinued on 04/17/19    Requested Prescriptions  Pending Prescriptions Disp Refills   famotidine (PEPCID) 20 MG tablet [Pharmacy Med Name: FAMOTIDINE 20 MG TABLET] 180 tablet 1    Sig: TAKE 1 TABLET BY MOUTH 2 TIMES DAILY. IF NEEDED FOR HEARTBURN DO NOT TAKE RANITIDINE      Gastroenterology:  H2 Antagonists Passed - 05/27/2019  5:14 PM      Passed - Valid encounter within last 12 months    Recent Outpatient Visits           5 months ago Essential hypertension, benign   [place], [place], FNP   9 months ago Essential hypertension, benign   [place], [place], NP   1 year ago Fall, subsequent encounter   [place], Satira Anis, MD   1 year ago Abnormal colonoscopy   [place], Satira Anis, MD   1 year ago Change in stool   [place], Satira Anis, MD       Future Appointments             In 1 month Uvaldo Rising, Astrid Divine, [facility], [place]   In 1 month Chesley Mires, MD [place]

## 2019-05-28 NOTE — Telephone Encounter (Signed)
Do this patient have a new PCP

## 2019-05-28 NOTE — Telephone Encounter (Signed)
Spoke with her daughter and she said she has not switched pcp. Stated that her mom has been going to [place] and that we referred her there.

## 2019-05-29 ENCOUNTER — Telehealth: Payer: Self-pay | Admitting: Pulmonary Disease

## 2019-05-29 DIAGNOSIS — G4733 Obstructive sleep apnea (adult) (pediatric): Secondary | ICD-10-CM

## 2019-05-29 NOTE — Telephone Encounter (Signed)
Spoke with the pt  She is requesting order to be sent for CPAP supplies  Order sent to [facility]  Nothing further needed

## 2019-06-03 ENCOUNTER — Telehealth: Payer: Self-pay | Admitting: Pulmonary Disease

## 2019-06-03 NOTE — Telephone Encounter (Signed)
ATC pt, no answer. Left message for pt to call back.  Dr. Halford Chessman, if pt agrees to have CPAP titration, can we place order?

## 2019-06-04 NOTE — Telephone Encounter (Signed)
Okay to place order for CPAP titration study if patient is agreeable.

## 2019-06-04 NOTE — Telephone Encounter (Signed)
ATC Patient.  LM to call back when available.  

## 2019-06-05 NOTE — Telephone Encounter (Signed)
LMTCB x2 for pt 

## 2019-06-06 NOTE — Telephone Encounter (Signed)
LMTCB x3 for pt. We have attempted to contact pt several times with no success or call back from pt. Per triage protocol, message will be closed.   

## 2019-06-26 ENCOUNTER — Ambulatory Visit: Payer: Medicare Other | Admitting: Family Medicine

## 2019-06-26 ENCOUNTER — Other Ambulatory Visit: Payer: Self-pay

## 2019-06-26 ENCOUNTER — Other Ambulatory Visit
Admission: RE | Admit: 2019-06-26 | Discharge: 2019-06-26 | Disposition: A | Discharge status: Home / Self Care | Payer: Medicare Other | Source: Ambulatory Visit | Attending: Acute Care | Admitting: Acute Care

## 2019-06-26 DIAGNOSIS — Z01812 Encounter for preprocedural laboratory examination: Secondary | ICD-10-CM | POA: Diagnosis present

## 2019-06-26 DIAGNOSIS — Z20822 Contact with and (suspected) exposure to covid-19: Secondary | ICD-10-CM | POA: Insufficient documentation

## 2019-06-26 LAB — SARS CORONAVIRUS 2 (TAT 6-24 HRS): SARS Coronavirus 2: NEGATIVE

## 2019-06-28 ENCOUNTER — Ambulatory Visit: Payer: Medicare Other | Attending: Pulmonary Disease

## 2019-06-28 DIAGNOSIS — Z8673 Personal history of transient ischemic attack (TIA), and cerebral infarction without residual deficits: Secondary | ICD-10-CM | POA: Insufficient documentation

## 2019-06-28 DIAGNOSIS — G4761 Periodic limb movement disorder: Secondary | ICD-10-CM | POA: Diagnosis not present

## 2019-06-28 DIAGNOSIS — I209 Angina pectoris, unspecified: Secondary | ICD-10-CM | POA: Insufficient documentation

## 2019-06-28 DIAGNOSIS — I10 Essential (primary) hypertension: Secondary | ICD-10-CM | POA: Insufficient documentation

## 2019-06-28 DIAGNOSIS — G4733 Obstructive sleep apnea (adult) (pediatric): Secondary | ICD-10-CM | POA: Diagnosis present

## 2019-07-01 ENCOUNTER — Other Ambulatory Visit: Payer: Self-pay

## 2019-07-03 DIAGNOSIS — G4733 Obstructive sleep apnea (adult) (pediatric): Secondary | ICD-10-CM

## 2019-07-04 ENCOUNTER — Ambulatory Visit (INDEPENDENT_AMBULATORY_CARE_PROVIDER_SITE_OTHER): Payer: Medicare Other | Admitting: Pulmonary Disease

## 2019-07-04 ENCOUNTER — Encounter: Payer: Self-pay | Admitting: Pulmonary Disease

## 2019-07-04 DIAGNOSIS — G4733 Obstructive sleep apnea (adult) (pediatric): Secondary | ICD-10-CM

## 2019-07-04 DIAGNOSIS — Z9989 Dependence on other enabling machines and devices: Secondary | ICD-10-CM | POA: Diagnosis not present

## 2019-07-04 NOTE — Patient Instructions (Signed)
Will call with results of CPAP titration study and then set up new CPAP supplies  Follow up in 4 months

## 2019-07-04 NOTE — Progress Notes (Addendum)
[facility]  Chief Complaint  Patient presents with  . Follow-up    CPAP    Constitutional:  There were no vitals taken for this visit.  Deferred.  Past Medical History:  Colitis, HTN, GERD, Iron deficiency anemia, HLD, HTN, Pre-diabetes  Summary:  Jocelyn [name] is a 86 y.o. female with obstructive sleep apnea.  Subjective:   Virtual Visit via Telephone Note  I connected with Jocelyn [name] on 07/04/19 at 12:00 PM EDT by telephone and verified that I am speaking with the correct person using two identifiers.  Location: Patient: home Provider: medical office   I discussed the limitations, risks, security and privacy concerns of performing an evaluation and management service by telephone and the availability of in person appointments. I also discussed with the patient that there may be a patient responsible charge related to this service. The patient expressed understanding and agreed to proceed.  She was seen previously seen by Dr. Ashby Dawes.  She had sleep study May 2019 and found to have severe sleep apnea.  She has CPAP machine, but needs new supplies.  She was seen by Eric Form on 05/20/19.  She was deemed to be non-compliant with therapy and DME advised she would need CPAP titration study before being eligible to get new CPAP supplies.  She reports having this done on 06/28/19 in [place].  Unfortunately results are available for review.  Physical Exam:   Deferred.  Assessment/Plan:   Obstructive sleep apnea. - will call her once results of CPAP titration study are available for review and then set her up for new CPAP supplies   I discussed the assessment and treatment plan with the patient. The patient was provided an opportunity to ask questions and all were answered. The patient agreed with the plan and demonstrated an understanding of the instructions.   The patient was advised to call back or seek an in-person  evaluation if the symptoms worsen or if the condition fails to improve as anticipated.  I provided 6 minutes of non-face-to-face time during this encounter.   Follow up:  Patient Instructions  Will call with results of CPAP titration study and then set up new CPAP supplies  Follow up in 4 months   Signature:  Chesley Mires, MD [place] Pager: [phone] 07/04/2019, 11:46 AM  Flow Sheet    Sleep tests:  PSG 06/07/17 >> AHI 34.4, SpO2 low 71.5% Auto CPAP 06/03/19 to 07/02/19 >> used on 29 of 30 nights with average 6 hrs 55 min.  Average AHI 13 with median CPAP 12 and 95 th percentile CPAP 17 cm H2O.  Air leak.  Medications:   Allergies as of 07/04/2019   No Known Allergies     Medication List       Accurate as of July 04, 2019 11:46 AM. If you have any questions, ask your nurse or doctor.        aspirin EC 81 MG tablet Take 1 tablet (81 mg total) by mouth daily.   calcium citrate-vitamin D 500-400 MG-UNIT chewable tablet Chew 1 tablet by mouth 2 (two) times daily.   cloNIDine 0.1 MG tablet Commonly known as: CATAPRES Take 1 tablet (0.1 mg total) by mouth 2 (two) times daily.   diclofenac sodium 1 % Gel Commonly known as: VOLTAREN Apply 4 g topically 4 (four) times daily as needed. On the knees   famotidine 20 MG tablet Commonly known as: PEPCID TAKE 1 TABLET BY MOUTH  2 TIMES DAILY. IF NEEDED FOR HEARTBURN DO NOT TAKE RANITIDINE   ferrous sulfate 325 (65 FE) MG tablet Take 1 tablet (325 mg total) by mouth daily.   HYDROcodone-acetaminophen 5-325 MG tablet Commonly known as: NORCO/VICODIN Take 1 tablet by mouth 2 (two) times daily as needed for severe pain. Must last 30 days.   isosorbide mononitrate 120 MG 24 hr tablet Commonly known as: IMDUR TAKE 1 TABLET BY MOUTH EVERY DAY   Olmesartan-amLODIPine-HCTZ 40-10-12.5 MG Tabs Take 1 tablet by mouth daily. For blood pressure   omeprazole 20 MG capsule Commonly known as: PRILOSEC Take 20  mg by mouth daily.   pravastatin 20 MG tablet Commonly known as: PRAVACHOL TAKE 1 TABLET BY MOUTH EVERY DAY       Past Surgical History:  She  has a past surgical history that includes Cataract extraction; Abdominal hysterectomy; Cholecystectomy; Lumbar disc surgery; and Colonoscopy with propofol (N/A, 04/09/2018).  Family History:  Her family history includes Anuerysm in her son; Cancer in her daughter and father; Diabetes in her daughter and mother; Heart disease in her mother; Hypertension in her daughter.  Social History:  She  reports that she has never smoked. She has never used smokeless tobacco. She reports that she does not drink alcohol or use drugs.

## 2019-07-09 ENCOUNTER — Encounter: Payer: Self-pay | Admitting: Student in an Organized Health Care Education/Training Program

## 2019-07-09 ENCOUNTER — Other Ambulatory Visit: Payer: Self-pay

## 2019-07-09 ENCOUNTER — Ambulatory Visit
Payer: Medicare Other | Attending: Student in an Organized Health Care Education/Training Program | Admitting: Student in an Organized Health Care Education/Training Program

## 2019-07-09 VITALS — BP 137/71 | HR 74 | Temp 97.9°F | Resp 14 | Ht 62.0 in | Wt 160.0 lb

## 2019-07-09 DIAGNOSIS — M25561 Pain in right knee: Secondary | ICD-10-CM | POA: Diagnosis present

## 2019-07-09 DIAGNOSIS — M47818 Spondylosis without myelopathy or radiculopathy, sacral and sacrococcygeal region: Secondary | ICD-10-CM | POA: Insufficient documentation

## 2019-07-09 DIAGNOSIS — M533 Sacrococcygeal disorders, not elsewhere classified: Secondary | ICD-10-CM | POA: Diagnosis not present

## 2019-07-09 DIAGNOSIS — M47816 Spondylosis without myelopathy or radiculopathy, lumbar region: Secondary | ICD-10-CM | POA: Insufficient documentation

## 2019-07-09 DIAGNOSIS — M25562 Pain in left knee: Secondary | ICD-10-CM | POA: Diagnosis present

## 2019-07-09 DIAGNOSIS — G894 Chronic pain syndrome: Secondary | ICD-10-CM

## 2019-07-09 DIAGNOSIS — G8929 Other chronic pain: Secondary | ICD-10-CM | POA: Insufficient documentation

## 2019-07-09 DIAGNOSIS — M5136 Other intervertebral disc degeneration, lumbar region: Secondary | ICD-10-CM | POA: Diagnosis present

## 2019-07-09 DIAGNOSIS — M1711 Unilateral primary osteoarthritis, right knee: Secondary | ICD-10-CM | POA: Diagnosis present

## 2019-07-09 MED ORDER — HYDROCODONE-ACETAMINOPHEN 5-325 MG PO TABS: 1.0000 | ORAL_TABLET | Freq: Two times a day (BID) | ORAL | 0 refills | Status: AC | PRN

## 2019-07-09 MED ORDER — DICLOFENAC SODIUM 1 % EX GEL: 4.0000 g | Freq: Four times a day (QID) | CUTANEOUS | 2 refills | Status: DC

## 2019-07-09 NOTE — Patient Instructions (Signed)
Prescriptions for Hydrocodone and Voltaren gel have beens sent to your pharmacy. ____________________________________________________________________________________________  Genicular Nerve Block  What is a genicular nerve block? A genicular nerve block is the injection of a local anesthetic to block the nerves that transmits pain from the knee.  What is the purpose of a facet nerve block? A genicular nerve block is a diagnostic procedure to determine if the pathologic changes (i.e. arthritis, meniscal tears, etc) and inflammation within the knee joint is the source of your knee pain. It also confirms that the knee pain will respond well to the actual treatment procedure. If a genicular nerve block works, it will give you relief for several hours. After that, the pain is expected to return to normal. This test is always performed twice (usually a week or two apart) because two successful tests are required to move onto treatment. If both diagnostic tests are positive, then we schedule a treatment called radiofrequency (RF) ablation. In this procedure, the same nerves are cauterized, which typically leads to pain relief for 4 -18 months. If this process works well for one knee, it can be performed on the other knee if needed.  How is the procedure performed? You will be placed on the procedure table. The injection site is sterilized with either iodine or chlorhexadine. The site to be injected is numbed with a local anesthetic, and a needle is directed to the target area. X-ray guidance is used to ensure proper placement and positioning of the needle. When the needle is properly positioned near the genicular nerve, local anesthetic is injected to numb that nerve. This will be repeated at multiple sites around the knee to block all genicular nerves.  Will the procedure be painful? The injection can be painful and we therefore provide the option of receiving IV sedation. IV sedation, combined with local  anesthetic, can make the injection nearly pain free. It allows you to remain very still during the procedure, which can also make the injection easier, faster, and more successful. If you decide to have IV sedation, you must have a driver to get you home safely afterwards. In addition, you cannot have anything to eat or drink within 8 hours of your appointment (clear liquids are allowed until 3 hours before the procedure). If you take medications for diabetes, these medications may need to be adjusted the morning of the procedure. Your primary care physician can help you with this adjustment.  What are the discharge instructions? If you received IV sedation do not drive or operate machinery for at least 24 hours after the procedure. You may return to work the next day following your procedure. You may resume your normal diet immediately. Do not engage in any strenuous activity for 24 hours. You should, however, engage in moderate activity that typically causes your ususal pain. If the block works, those activities should not be painful for several hours after the injection. Do not take a bath, swim, or use a hot tub for 24 hours (you may take a shower). Call the office if you have any of the following: severe pain afterwards (different than your usual symptoms), redness/swelling/discharge at the injection site(s), fevers/chills, difficulty with bowel or bladder functions.  What are the risks and side effects? The complication rate for this procedure is very low. Whenever a needle enters the skin, bleeding or infection can occur. Some other serious but extremely rare risks include paralysis and death. You may have an allergic reaction to any of the medications used. If  you have a known allergy to any medications, especially local anesthetics, notify our staff before the procedure takes place. You may experience any of the following side effects up to 4 - 6 hours after the procedure: . Leg muscle weakness or  numbness may occur due to the local anesthetic affecting the nerves that control your legs (this is a temporary affect and it is not paralysis). If you have any leg weakness or numbness, walk only with assistance in order to prevent falls and injury. Your leg strength will return slowly and completely. . Dizziness may occur due to a decrease in your blood pressure. If this occurs, remain in a seated or lying position. Gradually sit up, and then stand after at least 10 minutes of sitting. . Mild headaches may occur. Drink fluids and take pain medications if needed. If the headaches persist or become severe, call the office. . Mild discomfort at the injection site can occur. This typically lasts for a few hours but can persist for a couple days. If this occurs, take anti-inflammatories or pain medications, apply ice to the area the day of the procedure. If it persists, apply moist heat in the day(s) following.  The side effects listed above can be normal. They are not dangerous and will resolve on their own. If, however, you experience any of the following, a complication may have occurred and you should either contact your doctor. If he is not readily available, then you should proceed to the closest urgent care center for evaluation: . Severe or progressive pain at the injection site(s) . Arm or leg weakness that progressively worsens or persists for longer than 8 hours . Severe or progressive redness, swelling, or discharge from the injections site(s) . Fevers, chills, nausea, or vomiting . Bowel or bladder dysfunction (i.e. inability to urinate or pass stool or difficulty controlling either)  How long does it take for the procedure to work? You should feel relief from your usual pain within the first hour. Again, this is only expected to last for several hours, at the most. Remember, you may be sore in the middle part of your back from the needles, and you must distinguish this from your usual  pain. ____________________________________________________________________________________________

## 2019-07-09 NOTE — Progress Notes (Signed)
Nursing Pain Medication Assessment:  Safety precautions to be maintained throughout the outpatient stay will include: orient to surroundings, keep bed in low position, maintain call bell within reach at all times, provide assistance with transfer out of bed and ambulation.  Medication Inspection Compliance: Jocelyn [name] did not comply with our request to bring her pills to be counted. She was reminded that bringing the medication bottles, even when empty, is a requirement.  Medication: None brought in. Pill/Patch Count: None available to be counted. Bottle Appearance: No container available. Did not bring bottle(s) to appointment. Filled Date: N/A Last Medication intake:  Today  

## 2019-07-09 NOTE — Progress Notes (Signed)
PROVIDER NOTE: Information contained herein reflects review and annotations entered in association with encounter. Interpretation of such information and data should be left to medically-trained personnel. Information provided to patient can be located elsewhere in the medical record under "Patient Instructions". Document created using STT-dictation technology, any transcriptional errors that may result from process are unintentional.    Patient: Jocelyn [name]  Service Category: E/M  Provider: Gillis Santa, MD  DOB: 17-Nov-1931  DOS: 07/09/2019  Specialty: Interventional Pain Management  MRN: [id number]  Setting: Ambulatory outpatient  PCP: Crissie Figures, PA-C  Type: Established Patient    Referring Provider: Hubbard Hartshorn, FNP  Location: Office  Delivery: Face-to-face     HPI  Reason for encounter: Jocelyn [name], a 86 y.o. year old female, is here today for evaluation and management of her Chronic pain syndrome [G89.4]. [name] primary complain today is Back Pain (lower) and Knee Pain (bilateral) Last encounter: Practice (04/16/2019). My last encounter with her was on 04/16/2019. Pertinent problems: Ms. [name] has Lumbar degenerative disc disease; Hearing loss; Cerebrovascular accident (CVA) ([place]); Controlled substance agreement signed; Arthritis of both knees; Carpal tunnel syndrome on right; SI joint arthritis; Chronic pain of both knees; and Chronic pain syndrome on their pertinent problem list. Pain Assessment: Severity of Chronic pain is reported as a 8 /10. Location: Back Lower/denies. Onset: More than a month ago. Quality: Sharp. Timing: Intermittent. Modifying factor(s): medications. Vitals:  height is 5' 2"  (1.575 m) and weight is 160 lb (72.6 kg). Her temporal temperature is 97.9 F (36.6 C). Her blood pressure is 137/71 and her pulse is 74. Her respiration is 14 and oxygen saturation is 95%.   No change in medical history since last visit.  Patient's pain is at baseline.   Patient continues multimodal pain regimen as prescribed.  States that it provides pain relief and improvement in functional status.   Pharmacotherapy Assessment  Analgesic:  05/27/2019  1   04/16/2019  Hydrocodone-Acetamin 5-325 MG  60.00  30 Bi Lat   [id number]   Nor (4618)   0  10.00 MME  Medicare   [place]     Monitoring: [place] PMP: PDMP reviewed during this encounter.       Pharmacotherapy: No side-effects or adverse reactions reported. Compliance: No problems identified. Effectiveness: Clinically acceptable.  UDS:  Summary  Date Value Ref Range Status  03/06/2018 FINAL  Final    Comment:    ==================================================================== TOXAS[facility]RE COMP DRUG ANALYSIS,UR ==================================================================== Test                             Result       Flag       Units Drug Absent but Declared for Prescription Verification   Hydrocodone                    Not Detected UNEXPECTED ng/mg creat   Gabapentin                     Not Detected UNEXPECTED   Acetaminophen                  Not Detected UNEXPECTED    Acetaminophen, as indicated in the declared medication list, is    not always detected even when used as directed.   Diclofenac                     Not Detected UNEXPECTED  Topical diclofenac, as indicated in the declared medication list,    is not always detected even when used as directed.   Salicylate                     Not Detected UNEXPECTED    Aspirin, as indicated in the declared medication list, is not    always detected even when used as directed.   Clonidine                      Not Detected UNEXPECTED ==================================================================== Test                      Result    Flag   Units      Ref Range   Creatinine              41               mg/dL      >=20 ==================================================================== Declared Medications:  The flagging and interpretation on  this report are based on the  following declared medications.  Unexpected results may arise from  inaccuracies in the declared medications.  **Note: The testing scope of this panel includes these medications:  Clonidine (Catapres)  Gabapentin (Neurontin)  Hydrocodone (Norco)  **Note: The testing scope of this panel does not include small to  moderate amounts of these reported medications:  Acetaminophen (Norco)  Aspirin (Aspirin 81)  Topical Diclofenac (Voltaren)  **Note: The testing scope of this panel does not include following  reported medications:  Albuterol  Amlodipine  Calcium (Calcium/Vitamin D)  Famotidine (Pepcid)  Hydrochlorothiazide (HCTZ)  Iron (Ferrous Sulfate)  Isosorbide (Imdur)  Multivitamin (Centrum Silver)  Olmesartan  Potassium (K-Dur)  Pravastatin (Pravachol)  Supplement (Centrum Silver)  Vitamin D (Calcium/Vitamin D) ==================================================================== For clinical consultation, please call [phone]. ====================================================================       ROS  Constitutional: Denies any fever or chills Gastrointestinal: No reported hemesis, hematochezia, vomiting, or acute GI distress Musculoskeletal: Denies any acute onset joint swelling, redness, loss of ROM, or weakness Neurological: No reported episodes of acute onset apraxia, aphasia, dysarthria, agnosia, amnesia, paralysis, loss of coordination, or loss of consciousness  Medication Review  HYDROcodone-acetaminophen, Olmesartan-amLODIPine-HCTZ, aspirin EC, calcium citrate-vitamin D, cloNIDine, diclofenac Sodium, diclofenac sodium, famotidine, ferrous sulfate, isosorbide mononitrate, omeprazole, and pravastatin  History Review  Allergy: [name] has No Known Allergies. Drug: [name]  reports no history of drug use. Alcohol:  reports no history of alcohol use. Tobacco:  reports that she has never smoked. She has never used smokeless  tobacco. Social: [name]  reports that she has never smoked. She has never used smokeless tobacco. She reports that she does not drink alcohol or use drugs. Medical:  has a past medical history of Arthritis of both knees (08/01/2015), Colitis, Essential hypertension, benign (07/28/2014), GERD (gastroesophageal reflux disease), iron deficiency anemia (09/02/2015), Hyperlipidemia, Hypertension, Lumbar pain, Medicare annual wellness visit, subsequent (04/07/2017), OSA on CPAP (08/01/2015), Overweight (BMI 25.0-29.9) (07/10/2013), Prediabetes (09/02/2015), and SOB (shortness of breath) (07/10/2013). Surgical: Ms. Juncaj  has a past surgical history that includes Cataract extraction; Abdominal hysterectomy; Cholecystectomy; Lumbar disc surgery; and Colonoscopy with propofol (N/A, 04/09/2018). Family: family history includes Anuerysm in her son; Cancer in her daughter and father; Diabetes in her daughter and mother; Heart disease in her mother; Hypertension in her daughter.  Laboratory Chemistry Profile   Renal Lab Results  Component Value Date   BUN 14 08/01/2018  CREATININE 0.80 88/89/1694   BCR NOT APPLICABLE 50/38/8828   GFRAA 78 08/01/2018   GFRNONAA 67 08/01/2018     Hepatic Lab Results  Component Value Date   AST 19 08/01/2018   ALT 11 08/01/2018   ALBUMIN 4.1 07/07/2016   ALKPHOS 49 07/07/2016     Electrolytes Lab Results  Component Value Date   NA 140 08/01/2018   K 3.9 08/01/2018   CL 103 08/01/2018   CALCIUM 10.0 08/01/2018     Bone No results found for: VD25OH, VD125OH2TOT, MK3491PH1, TA5697XY8, 25OHVITD1, 25OHVITD2, 25OHVITD3, TESTOFREE, TESTOSTERONE   Inflammation (CRP: Acute Phase) (ESR: Chronic Phase) No results found for: CRP, ESRSEDRATE, LATICACIDVEN     Note: Above Lab results reviewed.  Recent Imaging Review  SLEEP STUDY DOCUMENTS Ordered by an unspecified provider. Note: Reviewed        Physical Exam  General appearance: Well nourished, well developed, and well  hydrated. In no apparent acute distress Mental status: Alert, oriented x 3 (person, place, & time)       Respiratory: No evidence of acute respiratory distress Eyes: PERLA Vitals: BP 137/71   Pulse 74   Temp 97.9 F (36.6 C) (Temporal)   Resp 14   Ht 5' 2"  (1.575 m)   Wt 160 lb (72.6 kg)   SpO2 95%   BMI 29.26 kg/m  BMI: Estimated body mass index is 29.26 kg/m as calculated from the following:   Height as of this encounter: 5' 2"  (1.575 m).   Weight as of this encounter: 160 lb (72.6 kg). Ideal: Ideal body weight: 50.1 kg (110 lb 7.2 oz) Adjusted ideal body weight: 59.1 kg (130 lb 4.3 oz)  Lumbar Spine Area Exam  Skin & Axial Inspection: No masses, redness, or swelling Alignment: Symmetrical Functional ROM: Pain restricted ROM       Stability: No instability detected Muscle Tone/Strength: Functionally intact. No obvious neuro-muscular anomalies detected. Sensory (Neurological): Musculoskeletal pain pattern  Gait & Posture Assessment  Ambulation: Patient came in today in a wheel chair Gait: Limited. Using assistive device to ambulate Posture: Difficulty standing up straight, due to pain    Lower Extremity Exam    Side: Right lower extremity  Side: Left lower extremity  Stability: No instability observed          Stability: No instability observed          Skin & Extremity Inspection: Skin color, temperature, and hair growth are WNL. No peripheral edema or cyanosis. No masses, redness, swelling, asymmetry, or associated skin lesions. No contractures.  Skin & Extremity Inspection: Skin color, temperature, and hair growth are WNL. No peripheral edema or cyanosis. No masses, redness, swelling, asymmetry, or associated skin lesions. No contractures.  Functional ROM: Pain restricted ROM for knee joint          Functional ROM: Pain restricted ROM for knee joint          Muscle Tone/Strength: Functionally intact. No obvious neuro-muscular anomalies detected.  Muscle Tone/Strength:  Functionally intact. No obvious neuro-muscular anomalies detected.  Sensory (Neurological): Arthropathic arthralgia        Sensory (Neurological): Arthropathic arthralgia        DTR: Patellar: deferred today Achilles: deferred today Plantar: deferred today  DTR: Patellar: deferred today Achilles: deferred today Plantar: deferred today  Palpation: No palpable anomalies  Palpation: No palpable anomalies    Assessment   Status Diagnosis  Controlled Controlled Controlled 1. Chronic pain syndrome   2. Lumbar facet arthropathy   3. Lumbar  degenerative disc disease   4. Chronic SI joint pain   5. SI joint arthritis   6. Chronic pain of both knees   7. Primary osteoarthritis of right knee      Updated Problems: Problem  Si Joint Arthritis  Chronic Pain of Both Knees  Chronic Pain Syndrome  Carpal Tunnel Syndrome On Right  Arthritis of Both Knees  Controlled Substance Agreement Signed  Lumbar Degenerative Disc Disease  Cerebrovascular Accident (Cva) ([facility])  Hearing Loss   Sensorineural, evaluated by Dr. Richardson Landry ENT March 2019     Plan of Care  Ms. MERRISSA GIACOBBE has a current medication list which includes the following long-term medication(s): calcium citrate-vitamin d, clonidine, famotidine, ferrous sulfate, isosorbide mononitrate, olmesartan-amlodipine-hctz, pravastatin, and [DISCONTINUED] famotidine.  Pharmacotherapy (Medications Ordered): Meds ordered this encounter  Medications  . HYDROcodone-acetaminophen (NORCO/VICODIN) 5-325 MG tablet    Sig: Take 1 tablet by mouth 2 (two) times daily as needed for severe pain. Must last 30 days.    Dispense:  60 tablet    Refill:  0    Chronic Pain. (STOP Act - Not applicable). Fill one day early if closed on scheduled refill date.  Marland Kitchen HYDROcodone-acetaminophen (NORCO/VICODIN) 5-325 MG tablet    Sig: Take 1 tablet by mouth 2 (two) times daily as needed for severe pain. Must last 30 days.    Dispense:  60 tablet    Refill:  0     Chronic Pain. (STOP Act - Not applicable). Fill one day early if closed on scheduled refill date.  . diclofenac Sodium (VOLTAREN) 1 % GEL    Sig: Apply 4 g topically 4 (four) times daily.    Dispense:  100 g    Refill:  2    Do not add this medication to the electronic "Automatic Refill" notification system. Patient may have prescription filled one day early if pharmacy is closed on scheduled refill date.   Orders:  Orders Placed This Encounter  Procedures  . GENICULAR NERVE BLOCK    For knee pain.    Standing Status:   Standing    Number of Occurrences:   1    Standing Expiration Date:   07/08/2020    Scheduling Instructions:     Side(s): Bilateral Knee     Level(s): Superior-Lateral, Superior-Medial, and Inferior-Medial Genicular Nerves     Sedation: Without Sedation.     TIMEFRAME: PRN procedure. (Ms. Flemings will call when needed.)    Order Specific Question:   Where will this procedure be performed?    Answer:   [facility]  . ToxASSURE Select 13 (MW), Urine    Volume: 30 ml(s). Minimum 3 ml of urine is needed. Document temperature of fresh sample. Indications: Long term (current) use of opiate analgesic (X93.716)   Follow-up plan:   Return in about 10 weeks (around 09/17/2019) for Medication Management, in person.   Recent Visits Date Type Provider Dept  04/16/19 Office Visit Gillis Santa, MD [facility]  Showing recent visits within past 90 days and meeting all other requirements   Today's Visits Date Type Provider Dept  07/09/19 Office Visit Gillis Santa, MD [facility]  Showing today's visits and meeting all other requirements   Future Appointments Date Type Provider Dept  09/17/19 Appointment Gillis Santa, MD [facility]  Showing future appointments within next 90 days and meeting all other requirements   I discussed the assessment and treatment plan with the patient. The patient was provided an opportunity  to ask questions  and all were answered. The patient agreed with the plan and demonstrated an understanding of the instructions.  Patient advised to call back or seek an in-person evaluation if the symptoms or condition worsens.  Duration of encounter: 31 minutes.  Note by: Gillis Santa, MD Date: 07/09/2019; Time: 3:01 PM

## 2019-07-19 ENCOUNTER — Telehealth: Payer: Self-pay | Admitting: Pulmonary Disease

## 2019-07-19 DIAGNOSIS — Z9989 Dependence on other enabling machines and devices: Secondary | ICD-10-CM

## 2019-07-19 NOTE — Telephone Encounter (Signed)
Left message for patient's daughter to call back. 

## 2019-07-22 NOTE — Telephone Encounter (Signed)
ATC Meribeth Mattes [phone] LMTCB X2

## 2019-07-23 ENCOUNTER — Telehealth: Payer: Self-pay | Admitting: Pulmonary Disease

## 2019-07-23 NOTE — Telephone Encounter (Signed)
Patient daughter called to check on CPAP supply order. Number for [employer] provided. She will call them to check on the order.

## 2019-07-23 NOTE — Telephone Encounter (Signed)
LMTCB x3 for pt's daughter, Roland Rack. We have attempted to contact pt's daughter several times with no success or call back from her. Per triage protocol, message will be closed.

## 2019-08-07 ENCOUNTER — Telehealth: Payer: Self-pay | Admitting: Pulmonary Disease

## 2019-08-07 DIAGNOSIS — G4733 Obstructive sleep apnea (adult) (pediatric): Secondary | ICD-10-CM

## 2019-08-07 NOTE — Telephone Encounter (Signed)
Please let her know that CPAP study showed good control of sleep apnea with different pressures depending on her stage of sleep.  Please make sure her auto CPAP settings are from 10 to 17 cm H2O. Please also send an order to her DME to arrange for new CPAP mask and supplies.

## 2019-08-07 NOTE — Telephone Encounter (Signed)
Patient daughter is aware of this and order has been placed nothing further is needed at this time.

## 2019-08-07 NOTE — Telephone Encounter (Signed)
ATC pt, no answer. Left message for pt to call back.  

## 2019-09-17 ENCOUNTER — Ambulatory Visit
Payer: Medicare Other | Attending: Student in an Organized Health Care Education/Training Program | Admitting: Student in an Organized Health Care Education/Training Program

## 2019-09-17 ENCOUNTER — Other Ambulatory Visit: Payer: Self-pay

## 2019-09-17 ENCOUNTER — Encounter: Payer: Self-pay | Admitting: Student in an Organized Health Care Education/Training Program

## 2019-09-17 VITALS — BP 145/77 | HR 73 | Temp 98.0°F | Resp 16 | Ht 63.0 in | Wt 164.0 lb

## 2019-09-17 DIAGNOSIS — G894 Chronic pain syndrome: Secondary | ICD-10-CM | POA: Diagnosis not present

## 2019-09-17 DIAGNOSIS — M5136 Other intervertebral disc degeneration, lumbar region: Secondary | ICD-10-CM | POA: Diagnosis not present

## 2019-09-17 DIAGNOSIS — M47816 Spondylosis without myelopathy or radiculopathy, lumbar region: Secondary | ICD-10-CM | POA: Diagnosis not present

## 2019-09-17 DIAGNOSIS — M47818 Spondylosis without myelopathy or radiculopathy, sacral and sacrococcygeal region: Secondary | ICD-10-CM | POA: Insufficient documentation

## 2019-09-17 NOTE — Patient Instructions (Signed)
You have 2 scripts that can be filled at your pharmacy.  Call the office when you are half out of the last bottle and make an appointment for a medicine refill.

## 2019-09-17 NOTE — Progress Notes (Signed)
Nursing Pain Medication Assessment:  Safety precautions to be maintained throughout the outpatient stay will include: orient to surroundings, keep bed in low position, maintain call bell within reach at all times, provide assistance with transfer out of bed and ambulation.  Medication Inspection Compliance: Pill count conducted under aseptic conditions, in front of the patient. Neither the pills nor the bottle was removed from the patient's sight at any time. Once count was completed pills were immediately returned to the patient in their original bottle.  Medication: Hydrocodone/APAP Pill/Patch Count: 26 of 60 pills remain Pill/Patch Appearance: Markings consistent with prescribed medication Bottle Appearance: Standard pharmacy container. Clearly labeled. Filled Date: 04 / 26 / 2021 Last Medication intake:  Today

## 2019-09-17 NOTE — Progress Notes (Signed)
PROVIDER NOTE: Information contained herein reflects review and annotations entered in association with encounter. Interpretation of such information and data should be left to medically-trained personnel. Information provided to patient can be located elsewhere in the medical record under "Patient Instructions". Document created using STT-dictation technology, any transcriptional errors that may result from process are unintentional.    Patient: Jocelyn [name]  Service Category: E/M  Provider: Gillis Santa, MD  DOB: 20-Feb-1931  DOS: 09/17/2019  Specialty: Interventional Pain Management  MRN: [id number]  Setting: Ambulatory outpatient  PCP: Crissie Figures, PA-C  Type: Established Patient    Referring Provider: Crissie Figures, PA-C  Location: Office  Delivery: Face-to-face     HPI  Reason for encounter: [name], a 86 y.o. year old female, is here today for evaluation and management of her Lumbar facet arthropathy [M47.816]. Ms. Carberry primary complain today is Knee Pain (bilateral) and Back Pain Last encounter: Practice (07/09/2019). My last encounter with her was on 07/09/2019. Pertinent problems: Ms. Mccloud has Lumbar degenerative disc disease; Hearing loss; Cerebrovascular accident (CVA) ([place]); Controlled substance agreement signed; Arthritis of both knees; Carpal tunnel syndrome on right; SI joint arthritis; Chronic pain of both knees; and Chronic pain syndrome on their pertinent problem list. Pain Assessment: Severity of Chronic pain is reported as a 0-No pain/10. Location: Knee Right, Left/denies. Onset: More than a month ago. Quality: Aching. Timing: Intermittent. Modifying factor(s): rest. Vitals:  height is _0  (1.6 m) and weight is 164 lb (74.4 kg). Her temperature is 98 F (36.7 C). Her blood pressure is 145/77 (abnormal) and her pulse is 73. Her respiration is 16 and oxygen saturation is 98%.   Patient presents today for medication management.  She is sad today and has been  grieving for the loss of her loved ones.  Patient states that she lost family members this month due to illness.  It has been difficult.  I reminded the patient that she still has prescriptions of hydrocodone at her pharmacy that she can pick up.  She does not need a refill today.  We reviewed how to take this medication, 5 mg once or twice a day when she has severe pain.  The majority of her pain is in her lower lumbar spine and bilateral hips.  She also has bilateral knee pain, right greater than left.  She comes in today in a wheelchair.  Pharmacotherapy Assessment   07/09/2019  1   07/09/2019  Hydrocodone-Acetamin 5-325 MG  60.00  30 Bi Lat   [id number]   Nor (4618)   0/0  10.00 MME  Medicare   [place]      Monitoring: [place] PMP: PDMP reviewed during this encounter.       Pharmacotherapy: No side-effects or adverse reactions reported. Compliance: No problems identified. Effectiveness: Clinically acceptable.  Dewayne Shorter, RN  09/17/2019 11:43 AM  Signed Nursing Pain Medication Assessment:  Safety precautions to be maintained throughout the outpatient stay will include: orient to surroundings, keep bed in low position, maintain call bell within reach at all times, provide assistance with transfer out of bed and ambulation.  Medication Inspection Compliance: Pill count conducted under aseptic conditions, in front of the patient. Neither the pills nor the bottle was removed from the patient's sight at any time. Once count was completed pills were immediately returned to the patient in their original bottle.  Medication: Hydrocodone/APAP Pill/Patch Count: 26 of 60 pills remain Pill/Patch Appearance: Markings consistent with prescribed medication Bottle Appearance: Standard pharmacy  container. Clearly labeled. Filled Date: 04 / 26 / 2021 Last Medication intake:  Today    UDS:  Summary  Date Value Ref Range Status  03/06/2018 FINAL  Final    Comment:     ==================================================================== TOXASSURE COMP DRUG ANALYSIS,UR ==================================================================== Test                             Result       Flag       Units Drug Absent but Declared for Prescription Verification   Hydrocodone                    Not Detected UNEXPECTED ng/mg creat   Gabapentin                     Not Detected UNEXPECTED   Acetaminophen                  Not Detected UNEXPECTED    Acetaminophen, as indicated in the declared medication list, is    not always detected even when used as directed.   Diclofenac                     Not Detected UNEXPECTED    Topical diclofenac, as indicated in the declared medication list,    is not always detected even when used as directed.   Salicylate                     Not Detected UNEXPECTED    Aspirin, as indicated in the declared medication list, is not    always detected even when used as directed.   Clonidine                      Not Detected UNEXPECTED ==================================================================== Test                      Result    Flag   Units      Ref Range   Creatinine              41               mg/dL      >=20 ==================================================================== Declared Medications:  The flagging and interpretation on this report are based on the  following declared medications.  Unexpected results may arise from  inaccuracies in the declared medications.  **Note: The testing scope of this panel includes these medications:  Clonidine (Catapres)  Gabapentin (Neurontin)  Hydrocodone (Norco)  **Note: The testing scope of this panel does not include small to  moderate amounts of these reported medications:  Acetaminophen (Norco)  Aspirin (Aspirin 81)  Topical Diclofenac (Voltaren)  **Note: The testing scope of this panel does not include following  reported medications:  Albuterol  Amlodipine  Calcium  (Calcium/Vitamin D)  Famotidine (Pepcid)  Hydrochlorothiazide (HCTZ)  Iron (Ferrous Sulfate)  Isosorbide (Imdur)  Multivitamin (Centrum Silver)  Olmesartan  Potassium (K-Dur)  Pravastatin (Pravachol)  Supplement (Centrum Silver)  Vitamin D (Calcium/Vitamin D) ==================================================================== For clinical consultation, please call [phone]. ====================================================================      ROS  Constitutional: Denies any fever or chills Gastrointestinal: No reported hemesis, hematochezia, vomiting, or acute GI distress Musculoskeletal: Low back pain, bilateral knee pain, bilateral hip pain Neurological: No reported episodes of acute onset apraxia, aphasia, dysarthria, agnosia, amnesia, paralysis, loss of coordination, or loss of consciousness  Medication Review  HYDROcodone-acetaminophen, Olmesartan-amLODIPine-HCTZ, aspirin EC, calcium citrate-vitamin D, cloNIDine, diclofenac sodium, famotidine, ferrous sulfate, isosorbide mononitrate, omeprazole, and pravastatin  History Review  Allergy: Ms. Rigsbee has No Known Allergies. Drug: Ms. Thinnes  reports no history of drug use. Alcohol:  reports no history of alcohol use. Tobacco:  reports that she has never smoked. She has never used smokeless tobacco. Social: Ms. Panjwani  reports that she has never smoked. She has never used smokeless tobacco. She reports that she does not drink alcohol and does not use drugs. Medical:  has a past medical history of Arthritis of both knees (08/01/2015), Colitis, Essential hypertension, benign (07/28/2014), GERD (gastroesophageal reflux disease), iron deficiency anemia (09/02/2015), Hyperlipidemia, Hypertension, Lumbar pain, Medicare annual wellness visit, subsequent (04/07/2017), OSA on CPAP (08/01/2015), Overweight (BMI 25.0-29.9) (07/10/2013), Prediabetes (09/02/2015), and SOB (shortness of breath) (07/10/2013). Surgical: Ms. Selk  has a past  surgical history that includes Cataract extraction; Abdominal hysterectomy; Cholecystectomy; Lumbar disc surgery; and Colonoscopy with propofol (N/A, 04/09/2018). Family: family history includes Anuerysm in her son; Cancer in her daughter and father; Diabetes in her daughter and mother; Heart disease in her mother; Hypertension in her daughter.  Laboratory Chemistry Profile   Renal Lab Results  Component Value Date   BUN 14 08/01/2018   CREATININE 0.80 39/04/90   BCR NOT APPLICABLE 33/00/7622   GFRAA 78 08/01/2018   GFRNONAA 67 08/01/2018     Hepatic Lab Results  Component Value Date   AST 19 08/01/2018   ALT 11 08/01/2018   ALBUMIN 4.1 07/07/2016   ALKPHOS 49 07/07/2016     Electrolytes Lab Results  Component Value Date   NA 140 08/01/2018   K 3.9 08/01/2018   CL 103 08/01/2018   CALCIUM 10.0 08/01/2018     Bone No results found for: VD25OH, VD125OH2TOT, QJ3354TG2, BW3893TD4, 25OHVITD1, 25OHVITD2, 25OHVITD3, TESTOFREE, TESTOSTERONE   Inflammation (CRP: Acute Phase) (ESR: Chronic Phase) No results found for: CRP, ESRSEDRATE, LATICACIDVEN     Note: Above Lab results reviewed.  Recent Imaging Review  SLEEP STUDY DOCUMENTS Ordered by an unspecified provider. Note: Reviewed        Physical Exam  General appearance: alert, cooperative and slowed mentation Mental status: Alert, oriented x 3 (person, place, & time)       Respiratory: No evidence of acute respiratory distress Eyes: PERLA Vitals: BP (!) 145/77   Pulse 73   Temp 98 F (36.7 C)   Resp 16   Ht _0  (1.6 m)   Wt 164 lb (74.4 kg)   SpO2 98%   BMI 29.05 kg/m  BMI: Estimated body mass index is 29.05 kg/m as calculated from the following:   Height as of this encounter: _1  (1.6 m).   Weight as of this encounter: 164 lb (74.4 kg). Ideal: Ideal body weight: 52.4 kg (115 lb 8.3 oz) Adjusted ideal body weight: 61.2 kg (134 lb 14.6 oz)  Positive low back pain, worse with facet loading.  Bilateral knee  pain. Patient comes in in wheelchair  Assessment   Status Diagnosis  Controlled Controlled Controlled 1. Lumbar facet arthropathy   2. Lumbar degenerative disc disease   3. SI joint arthritis   4. Chronic pain syndrome      Updated Problems: Problem  Lumbar Facet Arthropathy    Plan of Care   Ms. FREYA ZOBRIST has a current medication list which includes the following long-term medication(s): calcium citrate-vitamin d, clonidine, famotidine, ferrous sulfate, isosorbide mononitrate, olmesartan-amlodipine-hctz, pravastatin, and [DISCONTINUED] famotidine.  Patient still has 2 prescriptions of hydrocodone remaining at her pharmacy, no refills needed.  Discussed stretching exercises that she could do for her low back.  Encourage patient to utilize Salonpas to her low back.  Encouraged to follow-up for medication refill when she is on her last medication bottle of hydrocodone. Follow-up plan:   Return if symptoms worsen or fail to improve.   Recent Visits Date Type Provider Dept  07/09/19 Office Visit Gillis Santa, MD [facility]  Showing recent visits within past 90 days and meeting all other requirements Today's Visits Date Type Provider Dept  09/17/19 Office Visit Gillis Santa, MD [facility]  Showing today's visits and meeting all other requirements Future Appointments Date Type Provider Dept  11/21/19 Appointment Gillis Santa, MD [facility]  Showing future appointments within next 90 days and meeting all other requirements  I discussed the assessment and treatment plan with the patient. The patient was provided an opportunity to ask questions and all were answered. The patient agreed with the plan and demonstrated an understanding of the instructions.  Patient advised to call back or seek an in-person evaluation if the symptoms or condition worsens.  Duration of encounter:10 minutes.  Note by: Gillis Santa, MD Date: 09/17/2019; Time:  1:32 PM

## 2019-09-26 ENCOUNTER — Other Ambulatory Visit: Payer: Self-pay | Admitting: Family Medicine

## 2019-10-09 ENCOUNTER — Ambulatory Visit: Payer: Medicare Other | Admitting: Internal Medicine

## 2019-10-09 ENCOUNTER — Other Ambulatory Visit: Payer: Self-pay

## 2019-10-09 ENCOUNTER — Encounter: Payer: Self-pay | Admitting: Internal Medicine

## 2019-10-09 ENCOUNTER — Other Ambulatory Visit: Payer: Self-pay | Admitting: Student in an Organized Health Care Education/Training Program

## 2019-10-09 VITALS — BP 150/70 | HR 54 | Ht 63.0 in | Wt 164.2 lb

## 2019-10-09 DIAGNOSIS — R0602 Shortness of breath: Secondary | ICD-10-CM | POA: Diagnosis not present

## 2019-10-09 DIAGNOSIS — I10 Essential (primary) hypertension: Secondary | ICD-10-CM

## 2019-10-09 DIAGNOSIS — G894 Chronic pain syndrome: Secondary | ICD-10-CM

## 2019-10-09 DIAGNOSIS — M25562 Pain in left knee: Secondary | ICD-10-CM

## 2019-10-09 DIAGNOSIS — R011 Cardiac murmur, unspecified: Secondary | ICD-10-CM | POA: Diagnosis not present

## 2019-10-09 NOTE — Patient Instructions (Addendum)
Medication Instructions:  Your physician recommends that you continue on your current medications as directed. Please refer to the Current Medication list given to you today.  *If you need a refill on your cardiac medications before your next appointment, please call your pharmacy*   Lab Work: Jocelyn [name] will be requested from your primary care physician.  If you have labs (blood work) drawn today and your tests are completely normal, you will receive your results only by: Marland Kitchen MyChart Message (if you have MyChart) OR . A paper copy in the mail If you have any lab test that is abnormal or we need to change your treatment, we will call you to review the results.   Testing/Procedures: Your physician has requested that you have an echocardiogram. Echocardiography is a painless test that uses sound waves to create images of your heart. It provides your doctor with information about the size and shape of your heart and how well your heart's chambers and valves are working. This procedure takes approximately one hour. There are no restrictions for this procedure. You may get an IV, if needed, to receive an ultrasound enhancing agent through to better visualize your heart.    Follow-Up: At [facility], you and your health needs are our priority.  As part of our continuing mission to provide you with exceptional heart care, we have created designated Provider Care Teams.  These Care Teams include your primary Cardiologist (physician) and Advanced Practice Providers (APPs -  Physician Assistants and Nurse Practitioners) who all work together to provide you with the care you need, when you need it.  We recommend signing up for the patient portal called "MyChart".  Sign up information is provided on this After Visit Summary.  MyChart is used to connect with patients for Virtual Visits (Telemedicine).  Patients are able to view lab/test results, encounter notes, upcoming appointments, etc.  Non-urgent  messages can be sent to your provider as well.   To learn more about what you can do with MyChart, go to [email or web address].    Your next appointment:   1 month(s)  The format for your next appointment:   In Person  Provider:    You may see DR Harrell Gave END or one of the following Advanced Practice Providers on your designated Care Team:    Murray Hodgkins, NP  Christell Faith, PA-C  Marrianne Mood, PA-C    Echocardiogram An echocardiogram is a procedure that uses painless sound waves (ultrasound) to produce an image of the heart. Images from an echocardiogram can provide important information about:  Signs of coronary artery disease (CAD).  Aneurysm detection. An aneurysm is a weak or damaged part of an artery wall that bulges out from the normal force of blood pumping through the body.  Heart size and shape. Changes in the size or shape of the heart can be associated with certain conditions, including heart failure, aneurysm, and CAD.  Heart muscle function.  Heart valve function.  Signs of a past heart attack.  Fluid buildup around the heart.  Thickening of the heart muscle.  A tumor or infectious growth around the heart valves. Tell a health care provider about:  Any allergies you have.  All medicines you are taking, including vitamins, herbs, eye drops, creams, and over-the-counter medicines.  Any blood disorders you have.  Any surgeries you have had.  Any medical conditions you have.  Whether you are pregnant or may be pregnant. What are the risks? Generally, this is a safe procedure. However,  problems may occur, including:  Allergic reaction to dye (contrast) that may be used during the procedure. What happens before the procedure? No specific preparation is needed. You may eat and drink normally. What happens during the procedure?   An IV tube may be inserted into one of your veins.  You may receive contrast through this tube. A contrast  is an injection that improves the quality of the pictures from your heart.  A gel will be applied to your chest.  A wand-like tool (transducer) will be moved over your chest. The gel will help to transmit the sound waves from the transducer.  The sound waves will harmlessly bounce off of your heart to allow the heart images to be captured in real-time motion. The images will be recorded on a computer. The procedure may vary among health care providers and hospitals. What happens after the procedure?  You may return to your normal, everyday life, including diet, activities, and medicines, unless your health care provider tells you not to do that. Summary  An echocardiogram is a procedure that uses painless sound waves (ultrasound) to produce an image of the heart.  Images from an echocardiogram can provide important information about the size and shape of your heart, heart muscle function, heart valve function, and fluid buildup around your heart.  You do not need to do anything to prepare before this procedure. You may eat and drink normally.  After the echocardiogram is completed, you may return to your normal, everyday life, unless your health care provider tells you not to do that. This information is not intended to replace advice given to you by your health care provider. Make sure you discuss any questions you have with your health care provider. Document Revised: 05/10/2018 Document Reviewed: 02/20/2016 Elsevier Patient Education  [place].

## 2019-10-09 NOTE — Progress Notes (Signed)
New Outpatient Visit Date: 10/09/2019  Referring Provider: Crissie Figures, PA-C [address],  [place] [id number]  Chief Complaint: Shortness of breath  HPI:  Jocelyn [name] is a 86 y.o. female who is being seen today for evaluation of shortness of breath at the recommendation of her PCP. She has a history of stroke, hypertension, hyperlipidemia, prediabetes, obstructive sleep apnea on CPAP, and GERD.  She was previously followed by Dr. Clayborn Bigness at [facility], having last seen him in 2015.  Jocelyn [name] reports relatively sudden onset of shortness of breath about 1 month ago.  It can occur at rest or when she is active, though it is most pronounced whenever she walks.  She is currently able to walk ~50 feet before needing to rest.  She denies chest pain, palpitations, lightheadedness, edema, orthopnea, and PND.  Jocelyn [name] was prescribed an albuterol MDI by her PCP and notes that her breathing improves for a few hours after using the inhaler.  She denies a history of prior heart and lung problems.  Myocardial perfusion stress test from 2012 showed a fixed apical defect with LVEF 44%.  Medical therapy was recommended.  --------------------------------------------------------------------------------------------------  Cardiovascular History & Procedures: Cardiovascular Problems:  Stroke  Angina  Risk Factors:  Stroke, hypertension, hyperlipidemia, and prediabetes  Cath/PCI:  None  CV Surgery:  None  EP Procedures and Devices:  None  Non-Invasive Evaluation(s):  Pharmacologic MPI (07/14/2010): Abnormal study with small, fixed apical defect.  LVEF 44% with global hypokinesis and more severe hypokinesis at the apex.  Recent CV Pertinent Labs: Lab Results  Component Value Date   CHOL 203 (H) 08/01/2018   CHOL 166 07/08/2015   HDL 65 08/01/2018   HDL 60 07/08/2015   LDLCALC 120 (H) 08/01/2018   TRIG 84 08/01/2018   CHOLHDL 3.1 08/01/2018   K 3.9 08/01/2018    K 3.4 (L) 04/13/2011   BUN 14 08/01/2018   BUN 24 07/08/2015   BUN 14 04/13/2011   CREATININE 0.80 08/01/2018    --------------------------------------------------------------------------------------------------  Past Medical History:  Diagnosis Date  . Arthritis of both knees 08/01/2015  . Colitis   . Essential hypertension, benign 07/28/2014  . GERD (gastroesophageal reflux disease)   . Hx of iron deficiency anemia 09/02/2015  . Hyperlipidemia   . Hypertension   . Lumbar pain   . Medicare annual wellness visit, subsequent 04/07/2017  . OSA on CPAP 08/01/2015  . Overweight (BMI 25.0-29.9) 07/10/2013  . Prediabetes 09/02/2015  . SOB (shortness of breath) 07/10/2013    Past Surgical History:  Procedure Laterality Date  . ABDOMINAL HYSTERECTOMY    . CATARACT EXTRACTION    . CHOLECYSTECTOMY    . COLONOSCOPY WITH PROPOFOL N/A 04/09/2018   Procedure: COLONOSCOPY WITH PROPOFOL;  Surgeon: Lin Landsman, MD;  Location: [facility] ENDOSCOPY;  Service: Gastroenterology;  Laterality: N/A;  . LUMBAR DISC SURGERY      Current Meds  Medication Sig  . albuterol (VENTOLIN HFA) 108 (90 Base) MCG/ACT inhaler Inhale 1 puff into the lungs as needed.  Marland Kitchen aspirin EC 81 MG tablet Take 1 tablet (81 mg total) by mouth daily.  . calcium citrate-vitamin D 500-400 MG-UNIT chewable tablet Chew 1 tablet by mouth 2 (two) times daily.   . cloNIDine (CATAPRES) 0.1 MG tablet Take 1 tablet (0.1 mg total) by mouth 2 (two) times daily.  . diclofenac sodium (VOLTAREN) 1 % GEL Apply 4 g topically 4 (four) times daily as needed. On the knees  . famotidine (PEPCID)  20 MG tablet TAKE 1 TABLET BY MOUTH 2 TIMES DAILY. IF NEEDED FOR HEARTBURN DO NOT TAKE RANITIDINE  . ferrous sulfate 325 (65 FE) MG tablet Take 1 tablet (325 mg total) by mouth daily.  Marland Kitchen HYDROcodone-acetaminophen (NORCO/VICODIN) 5-325 MG tablet Take 1 tablet by mouth 2 (two) times daily as needed for moderate pain.  . isosorbide mononitrate (IMDUR) 120 MG 24 hr  tablet TAKE 1 TABLET BY MOUTH EVERY DAY  . Olmesartan-amLODIPine-HCTZ 40-10-12.5 MG TABS Take 1 tablet by mouth daily. For blood pressure  . omeprazole (PRILOSEC) 20 MG capsule Take 20 mg by mouth daily.  . pravastatin (PRAVACHOL) 20 MG tablet TAKE 1 TABLET BY MOUTH EVERY DAY    Allergies: Patient has no known allergies.  Social History   Tobacco Use  . Smoking status: Never Smoker  . Smokeless tobacco: Never Used  Vaping Use  . Vaping Use: Never used  Substance Use Topics  . Alcohol use: No    Alcohol/week: 0.0 standard drinks  . Drug use: No    Family History  Problem Relation Age of Onset  . Hypertension Daughter   . Diabetes Daughter   . Cancer Daughter        breast  . Diabetes Mother   . Heart disease Mother   . Cancer Father        lung; smoker  . Anuerysm Son     Review of Systems: A 12-system review of systems was performed and was negative except as noted in the HPI.  --------------------------------------------------------------------------------------------------  Physical Exam: BP (!) 150/70 (BP Location: Left Arm, Patient Position: Sitting, Cuff Size: Normal)   Pulse (!) 54   Ht 5\' 3"  (1.6 m)   Wt 164 lb 4 oz (74.5 kg)   SpO2 98%   BMI 29.10 kg/m   General:  NAD.  Accompanied by her daughter. HEENT: No conjunctival pallor or scleral icterus. Facemask in place. Neck: Supple without lymphadenopathy, thyromegaly, JVD, or HJR. No carotid bruit. Lungs: Normal work of breathing. Clear to auscultation bilaterally without wheezes or crackles. Heart: Bradycardic but regular with 2/6 systolic murmur.  No rubs or gallops. Non-displaced PMI. Abd: Bowel sounds present. Soft, NT/ND without hepatosplenomegaly Ext: No lower extremity edema. Radial, PT, and DP pulses are 2+ bilaterally Skin: Warm and dry without rash. Neuro: CNIII-XII intact. Strength and fine-touch sensation intact in upper and lower extremities bilaterally. Psych: Normal mood and  affect.  EKG:  Sinus bradycardia with nonspecific T wave abnormality.  LVH with abnormal repolarization noted on prior tracing from 04/13/2011 is no longer evident.  Lab Results  Component Value Date   WBC 8.6 08/01/2018   HGB 11.5 (L) 08/01/2018   HCT 35.1 08/01/2018   MCV 84.2 08/01/2018   PLT 192 08/01/2018    Lab Results  Component Value Date   NA 140 08/01/2018   K 3.9 08/01/2018   CL 103 08/01/2018   CO2 28 08/01/2018   BUN 14 08/01/2018   CREATININE 0.80 08/01/2018   GLUCOSE 87 08/01/2018   ALT 11 08/01/2018    Lab Results  Component Value Date   CHOL 203 (H) 08/01/2018   HDL 65 08/01/2018   LDLCALC 120 (H) 08/01/2018   TRIG 84 08/01/2018   CHOLHDL 3.1 08/01/2018     --------------------------------------------------------------------------------------------------  ASSESSMENT AND PLAN: Shortness of breath: There are several potential etiologies for dyspnea.  Examination is notable for soft systolic murmur.  EKG shows sinus bradycardia with nonspecific T wave changes but otherwise no significant abnormality.  MPI in 2013 suggested apical scar versus artifact and moderately reduced LVEF.  Improvement in dyspnea with albuterol suggests underlying lung process.  I have recommended that we obtain an echocardiogram to reassess LVEF and exclude significant valvular heart disease contributing to the the patient's dyspnea.  If echo is unrevealing, ischemia testing may need to be repeated.  Further workup of possible pulmonary disease should be considered if cardiac workup is unrevealing.  We will also request results of recent labs drawn by Jocelyn [name]'s PCP.  Angina pectoris: Previously noted in Dr. Etta Quill notes, though details are unclear.  For now, we will plan to continue isosorbide mononitrate, aspirin, and pravastatin.  Hypertension: BP moderately elevated today.  I will defer ongoing management to Jocelyn [name]'s PCP.  Follow-up: Return to clinic in 1  month.  Nelva Bush, MD 10/09/2019 10:07 AM

## 2019-10-10 ENCOUNTER — Encounter: Payer: Self-pay | Admitting: Internal Medicine

## 2019-10-29 ENCOUNTER — Ambulatory Visit: Payer: Medicare Other | Admitting: Cardiology

## 2019-10-31 ENCOUNTER — Other Ambulatory Visit: Payer: Medicare Other

## 2019-11-01 ENCOUNTER — Ambulatory Visit (INDEPENDENT_AMBULATORY_CARE_PROVIDER_SITE_OTHER): Payer: Medicare Other

## 2019-11-01 ENCOUNTER — Other Ambulatory Visit: Payer: Self-pay

## 2019-11-01 DIAGNOSIS — R011 Cardiac murmur, unspecified: Secondary | ICD-10-CM | POA: Diagnosis not present

## 2019-11-01 DIAGNOSIS — R0602 Shortness of breath: Secondary | ICD-10-CM | POA: Diagnosis not present

## 2019-11-01 LAB — ECHOCARDIOGRAM COMPLETE
AR max vel: 3.21 cm2
AV Area VTI: 3.43 cm2
AV Area mean vel: 2.83 cm2
AV Mean grad: 4 mmHg
AV Peak grad: 7.3 mmHg
Ao pk vel: 1.35 m/s
Area-P 1/2: 3.76 cm2

## 2019-11-21 ENCOUNTER — Encounter: Payer: Self-pay | Admitting: Student in an Organized Health Care Education/Training Program

## 2019-11-21 ENCOUNTER — Other Ambulatory Visit: Payer: Self-pay

## 2019-11-21 ENCOUNTER — Ambulatory Visit
Payer: Medicare Other | Attending: Student in an Organized Health Care Education/Training Program | Admitting: Student in an Organized Health Care Education/Training Program

## 2019-11-21 VITALS — BP 136/62 | HR 69 | Temp 97.6°F | Resp 16 | Ht 63.0 in | Wt 164.0 lb

## 2019-11-21 DIAGNOSIS — M47818 Spondylosis without myelopathy or radiculopathy, sacral and sacrococcygeal region: Secondary | ICD-10-CM | POA: Diagnosis not present

## 2019-11-21 DIAGNOSIS — G894 Chronic pain syndrome: Secondary | ICD-10-CM | POA: Diagnosis present

## 2019-11-21 DIAGNOSIS — M25561 Pain in right knee: Secondary | ICD-10-CM | POA: Diagnosis present

## 2019-11-21 DIAGNOSIS — M5136 Other intervertebral disc degeneration, lumbar region: Secondary | ICD-10-CM | POA: Diagnosis not present

## 2019-11-21 DIAGNOSIS — M47816 Spondylosis without myelopathy or radiculopathy, lumbar region: Secondary | ICD-10-CM | POA: Diagnosis not present

## 2019-11-21 DIAGNOSIS — M533 Sacrococcygeal disorders, not elsewhere classified: Secondary | ICD-10-CM

## 2019-11-21 DIAGNOSIS — G8929 Other chronic pain: Secondary | ICD-10-CM | POA: Insufficient documentation

## 2019-11-21 DIAGNOSIS — M25562 Pain in left knee: Secondary | ICD-10-CM | POA: Insufficient documentation

## 2019-11-21 MED ORDER — HYDROCODONE-ACETAMINOPHEN 5-325 MG PO TABS: 1.0000 | ORAL_TABLET | Freq: Two times a day (BID) | ORAL | 0 refills | Status: AC | PRN

## 2019-11-21 MED ORDER — HYDROCODONE-ACETAMINOPHEN 5-325 MG PO TABS: 1.0000 | ORAL_TABLET | Freq: Two times a day (BID) | ORAL | 0 refills | Status: DC | PRN

## 2019-11-21 NOTE — Progress Notes (Signed)
PROVIDER NOTE: Information contained herein reflects review and annotations entered in association with encounter. Interpretation of such information and data should be left to medically-trained personnel. Information provided to patient can be located elsewhere in the medical record under "Patient Instructions". Document created using STT-dictation technology, any transcriptional errors that may result from process are unintentional.    Patient: Jocelyn [name]  Service Category: E/M  Provider: Gillis Santa, MD  DOB: 15-Jan-1932  DOS: 11/21/2019  Specialty: Interventional Pain Management  MRN: [id number]  Setting: Ambulatory outpatient  PCP: Crissie Figures, PA-C  Type: Established Patient    Referring Provider: Crissie Figures, PA-C  Location: Office  Delivery: Face-to-face     HPI  [name], a 86 y.o. year old female, is here today because of her Lumbar facet arthropathy [M47.816]. [name] primary complain today is Back Pain and Knee Pain Last encounter: My last encounter with her was on 10/09/2019. Pertinent problems: Jocelyn [name] has Lumbar degenerative disc disease; Hearing loss; Cerebrovascular accident (CVA) ([place]); Controlled substance agreement signed; Arthritis of both knees; Carpal tunnel syndrome on right; SI joint arthritis; Chronic pain of both knees; and Chronic pain syndrome on their pertinent problem list. Pain Assessment: Severity of Chronic pain is reported as a 3/10. Location: Back Lower/denies. Onset: More than a month ago. Quality: Aching. Timing: Intermittent. Modifying factor(s): meds, rest. Vitals:  height is 5' 3"  (1.6 m) and weight is 164 lb (74.4 kg). Her temperature is 97.6 F (36.4 C). Her blood pressure is 136/62 and her pulse is 69. Her respiration is 16 and oxygen saturation is 99%.   Reason for encounter: medication management.   No change in medical history since last visit.  Patient's pain is at baseline.  Patient continues multimodal pain regimen as  prescribed.  States that it provides pain relief and improvement in functional status.  Pharmacotherapy Assessment   Analgesic: Hydrocodone 5 mg twice daily as needed MME equals 10  Monitoring: [place] PMP: PDMP reviewed during this encounter.       Pharmacotherapy: No side-effects or adverse reactions reported. Compliance: No problems identified. Effectiveness: Clinically acceptable.  Dewayne Shorter, RN  11/21/2019 11:00 AM  Sign when Signing Visit Nursing Pain Medication Assessment:  Safety precautions to be maintained throughout the outpatient stay will include: orient to surroundings, keep bed in low position, maintain call bell within reach at all times, provide assistance with transfer out of bed and ambulation.  Medication Inspection Compliance: Pill count conducted under aseptic conditions, in front of the patient. Neither the pills nor the bottle was removed from the patient's sight at any time. Once count was completed pills were immediately returned to the patient in their original bottle.  Medication: Hydrocodone/APAP Pill/Patch Count: 14 of 60 pills remain Pill/Patch Appearance: Markings consistent with prescribed medication Bottle Appearance: Standard pharmacy container. Clearly labeled. Filled Date:09 / 15 / 2021 Last Medication intake:  Today    UDS:  Summary  Date Value Ref Range Status  03/06/2018 FINAL  Final    Comment:    ==================================================================== [facility] COMP DRUG ANALYSIS,UR ==================================================================== Test                             Result       Flag       Units Drug Absent but Declared for Prescription Verification   Hydrocodone  Not Detected UNEXPECTED ng/mg creat   Gabapentin                     Not Detected UNEXPECTED   Acetaminophen                  Not Detected UNEXPECTED    Acetaminophen, as indicated in the declared medication list, is    not always  detected even when used as directed.   Diclofenac                     Not Detected UNEXPECTED    Topical diclofenac, as indicated in the declared medication list,    is not always detected even when used as directed.   Salicylate                     Not Detected UNEXPECTED    Aspirin, as indicated in the declared medication list, is not    always detected even when used as directed.   Clonidine                      Not Detected UNEXPECTED ==================================================================== Test                      Result    Flag   Units      Ref Range   Creatinine              41               mg/dL      >=20 ==================================================================== Declared Medications:  The flagging and interpretation on this report are based on the  following declared medications.  Unexpected results may arise from  inaccuracies in the declared medications.  **Note: The testing scope of this panel includes these medications:  Clonidine (Catapres)  Gabapentin (Neurontin)  Hydrocodone (Norco)  **Note: The testing scope of this panel does not include small to  moderate amounts of these reported medications:  Acetaminophen (Norco)  Aspirin (Aspirin 81)  Topical Diclofenac (Voltaren)  **Note: The testing scope of this panel does not include following  reported medications:  Albuterol  Amlodipine  Calcium (Calcium/Vitamin D)  Famotidine (Pepcid)  Hydrochlorothiazide (HCTZ)  Iron (Ferrous Sulfate)  Isosorbide (Imdur)  Multivitamin (Centrum Silver)  Olmesartan  Potassium (K-Dur)  Pravastatin (Pravachol)  Supplement (Centrum Silver)  Vitamin D (Calcium/Vitamin D) ==================================================================== For clinical consultation, please call [phone]. ====================================================================      ROS  Constitutional: Denies any fever or chills Gastrointestinal: No reported hemesis,  hematochezia, vomiting, or acute GI distress Musculoskeletal: Denies any acute onset joint swelling, redness, loss of ROM, or weakness Neurological: No reported episodes of acute onset apraxia, aphasia, dysarthria, agnosia, amnesia, paralysis, loss of coordination, or loss of consciousness  Medication Review  HYDROcodone-acetaminophen, Olmesartan-amLODIPine-HCTZ, albuterol, aspirin EC, calcium citrate-vitamin D, cloNIDine, diclofenac sodium, famotidine, ferrous sulfate, isosorbide mononitrate, omeprazole, and pravastatin  History Review  Allergy: Jocelyn [name] has No Known Allergies. Drug: Jocelyn [name]  reports no history of drug use. Alcohol:  reports no history of alcohol use. Tobacco:  reports that she has never smoked. She has never used smokeless tobacco. Social: Jocelyn [name]  reports that she has never smoked. She has never used smokeless tobacco. She reports that she does not drink alcohol and does not use drugs. Medical:  has a past medical history of Arthritis of both knees (08/01/2015), Colitis, Essential hypertension, benign (07/28/2014), GERD (gastroesophageal  reflux disease), iron deficiency anemia (09/02/2015), Hyperlipidemia, Hypertension, Lumbar pain, Medicare annual wellness visit, subsequent (04/07/2017), OSA on CPAP (08/01/2015), Overweight (BMI 25.0-29.9) (07/10/2013), Prediabetes (09/02/2015), and SOB (shortness of breath) (07/10/2013). Surgical: Jocelyn [name]  has a past surgical history that includes Cataract extraction; Abdominal hysterectomy; Cholecystectomy; Lumbar disc surgery; and Colonoscopy with propofol (N/A, 04/09/2018). Family: family history includes Anuerysm in her son; Cancer in her daughter and father; Diabetes in her daughter and mother; Heart disease in her mother; Hypertension in her daughter.  Laboratory Chemistry Profile   Renal Lab Results  Component Value Date   BUN 14 08/01/2018   CREATININE 0.80 50/04/7046   BCR NOT APPLICABLE 88/91/6945   GFRAA 78 08/01/2018   GFRNONAA  67 08/01/2018     Hepatic Lab Results  Component Value Date   AST 19 08/01/2018   ALT 11 08/01/2018   ALBUMIN 4.1 07/07/2016   ALKPHOS 49 07/07/2016     Electrolytes Lab Results  Component Value Date   NA 140 08/01/2018   K 3.9 08/01/2018   CL 103 08/01/2018   CALCIUM 10.0 08/01/2018     Bone No results found for: VD25OH, VD125OH2TOT, WT8882CM0, LK9179XT0, 25OHVITD1, 25OHVITD2, 25OHVITD3, TESTOFREE, TESTOSTERONE   Inflammation (CRP: Acute Phase) (ESR: Chronic Phase) No results found for: CRP, ESRSEDRATE, LATICACIDVEN     Note: Above Lab results reviewed.  Recent Imaging Review  ECHOCARDIOGRAM COMPLETE    ECHOCARDIOGRAM REPORT       Patient Name:   AUNICA DAUPHINEE Date of Exam: 11/01/2019 Medical Rec #:  [id number]        Height:       63.0 in Accession #:    [id number]       Weight:       164.2 lb Date of Birth:  Jun 25, 1931        BSA:          1.778 m Patient Age:    27 years         BP:           144/72 mmHg Patient Gender: F                HR:           68 bpm. Exam Location:  [facility]  Procedure: 2D Echo, Cardiac Doppler and Color Doppler  Indications:    R06.02 SOB   History:        Patient has no prior history of Echocardiogram examinations.                 Stroke, Signs/Symptoms:Shortness of Breath, Chest Pain and                 Murmur; Risk Factors:Sleep Apnea, Dyslipidemia and Non-Smoker.   Sonographer:    Pilar Jarvis RDMS, RVT, RDCS Referring Phys: [place]   1. Left ventricular ejection fraction, by estimation, is 60 to 65%. The left ventricle has normal function. The left ventricle has no regional wall motion abnormalities. There is severe left ventricular hypertrophy. Left ventricular diastolic parameters  are consistent with Grade I diastolic dysfunction (impaired relaxation).  2. Right ventricular systolic function is normal. The right ventricular size is normal. Tricuspid regurgitation signal is inadequate for  assessing PA pressure.  3. Left atrial size was moderately dilated.  4. Mild mitral valve regurgitation. Moderate mitral annular calcification.  FINDINGS  Left Ventricle: Left ventricular ejection fraction, by estimation, is 60 to 65%. The left ventricle has normal function. The left  ventricle has no regional wall motion abnormalities. The left ventricular internal cavity size was normal in size. There is  severe left ventricular hypertrophy. Left ventricular diastolic parameters are consistent with Grade I diastolic dysfunction (impaired relaxation).  Right Ventricle: The right ventricular size is normal. No increase in right ventricular wall thickness. Right ventricular systolic function is normal. Tricuspid regurgitation signal is inadequate for assessing PA pressure.  Left Atrium: Left atrial size was moderately dilated.  Right Atrium: Right atrial size was normal in size.  Pericardium: There is no evidence of pericardial effusion.  Mitral Valve: The mitral valve is normal in structure. Moderate mitral annular calcification. Mild mitral valve regurgitation. No evidence of mitral valve stenosis.  Tricuspid Valve: The tricuspid valve is normal in structure. Tricuspid valve regurgitation is not demonstrated. No evidence of tricuspid stenosis.  Aortic Valve: The aortic valve is normal in structure. Aortic valve regurgitation is not visualized. Mild to moderate aortic valve sclerosis/calcification is present, without any evidence of aortic stenosis. Aortic valve mean gradient measures 4.0 mmHg.  Aortic valve peak gradient measures 7.3 mmHg. Aortic valve area, by VTI measures 3.43 cm.  Pulmonic Valve: The pulmonic valve was normal in structure. Pulmonic valve regurgitation is mild. No evidence of pulmonic stenosis.  Aorta: The aortic root is normal in size and structure.  Venous: The inferior vena cava is normal in size with greater than 50% respiratory variability, suggesting right atrial  pressure of 3 mmHg.  IAS/Shunts: No atrial level shunt detected by color flow Doppler.    LEFT VENTRICLE PLAX 2D LVIDd:         3.20 cm     Diastology LV PW:         2.00 cm     LV e' medial:    3.15 cm/s LV IVS:        2.10 cm     LV E/e' medial:  32.4 LVOT diam:     2.00 cm     LV e' lateral:   4.62 cm/s LV SV:         85          LV E/e' lateral: 22.1 LV SV Index:   48 LVOT Area:     3.14 cm   LV Volumes (MOD) LV vol d, MOD A4C: 37.0 ml LV SV MOD A4C:     37.0 ml  RIGHT VENTRICLE             IVC RV Basal diam:  4.30 cm     IVC diam: 1.70 cm RV Mid diam:    3.50 cm RV S prime:     13.10 cm/s TAPSE (M-mode): 2.3 cm  LEFT ATRIUM             Index       RIGHT ATRIUM           Index LA diam:        3.90 cm 2.19 cm/m  RA Area:     12.90 cm LA Vol (A2C):   92.4 ml 51.96 ml/m RA Volume:   28.40 ml  15.97 ml/m LA Vol (A4C):   82.7 ml 46.50 ml/m LA Biplane Vol: 89.9 ml 50.55 ml/m  AORTIC VALVE AV Area (Vmax):    3.21 cm AV Area (Vmean):   2.83 cm AV Area (VTI):     3.43 cm AV Vmax:           135.00 cm/s AV Vmean:  87.600 cm/s AV VTI:            0.249 m AV Peak [name]:      7.3 mmHg AV Mean [name]:      4.0 mmHg LVOT Vmax:         138.00 cm/s LVOT Vmean:        79.000 cm/s LVOT VTI:          0.272 m LVOT/AV VTI ratio: 1.09   AORTA Ao Root diam: 2.90 cm Ao Asc diam:  3.40 cm Ao Arch diam: 2.5 cm  MITRAL VALVE MV Area (PHT): 3.76 cm     SHUNTS MV Decel Time: 202 msec     Systemic VTI:  0.27 m MV E velocity: 102.00 cm/s  Systemic Diam: 2.00 cm MV A velocity: 108.00 cm/s MV E/A ratio:  0.94  Ida Rogue MD Electronically signed by Ida Rogue MD Signature Date/Time: 11/01/2019/5:11:34 PM      Final   Note: Reviewed        Physical Exam  General appearance: Well nourished, well developed, and well hydrated. In no apparent acute distress Mental status: Alert, oriented x 3 (person, place, & time)       Respiratory: No evidence of acute  respiratory distress Eyes: PERLA Vitals: BP 136/62   Pulse 69   Temp 97.6 F (36.4 C)   Resp 16   Ht 5' 3"  (1.6 m)   Wt 164 lb (74.4 kg)   SpO2 99%   BMI 29.05 kg/m  BMI: Estimated body mass index is 29.05 kg/m as calculated from the following:   Height as of this encounter: 5' 3"  (1.6 m).   Weight as of this encounter: 164 lb (74.4 kg). Ideal: Ideal body weight: 52.4 kg (115 lb 8.3 oz) Adjusted ideal body weight: 61.2 kg (134 lb 14.6 oz)  Positive low back pain, worse with facet loading.  Bilateral knee pain. Patient comes in in wheelchair  Assessment   Status Diagnosis  Controlled Controlled Controlled 1. Lumbar facet arthropathy   2. Lumbar degenerative disc disease   3. SI joint arthritis   4. Chronic SI joint pain   5. Chronic pain of both knees   6. Chronic pain syndrome       Plan of Care  Jocelyn [name] has a current medication list which includes the following long-term medication(s): calcium citrate-vitamin d, clonidine, famotidine, ferrous sulfate, isosorbide mononitrate, olmesartan-amlodipine-hctz, pravastatin, and [DISCONTINUED] famotidine.  Pharmacotherapy (Medications Ordered): Meds ordered this encounter  Medications  . HYDROcodone-acetaminophen (NORCO/VICODIN) 5-325 MG tablet    Sig: Take 1 tablet by mouth 2 (two) times daily as needed for moderate pain. For chronic pain syndrome    Dispense:  60 tablet    Refill:  0  . HYDROcodone-acetaminophen (NORCO/VICODIN) 5-325 MG tablet    Sig: Take 1 tablet by mouth 2 (two) times daily as needed for moderate pain. For chronic pain syndrome    Dispense:  60 tablet    Refill:  0   Orders:  Orders Placed This Encounter  Procedures  . ToxASSURE Select 13 (MW), Urine    Volume: 30 ml(s). Minimum 3 ml of urine is needed. Document temperature of fresh sample. Indications: Long term (current) use of opiate analgesic [phone])    Order Specific Question:   Release to patient    Answer:   Immediate    Follow-up plan:   Return in about 3 months (around 02/21/2020) for Medication Management, in person.   Recent Visits Date Type  Provider Dept  09/17/19 Office Visit Gillis Santa, MD [facility]  Showing recent visits within past 90 days and meeting all other requirements Today's Visits Date Type Provider Dept  11/21/19 Office Visit Gillis Santa, MD [facility]  Showing today's visits and meeting all other requirements Future Appointments No visits were found meeting these conditions. Showing future appointments within next 90 days and meeting all other requirements  I discussed the assessment and treatment plan with the patient. The patient was provided an opportunity to ask questions and all were answered. The patient agreed with the plan and demonstrated an understanding of the instructions.  Patient advised to call back or seek an in-person evaluation if the symptoms or condition worsens.  Duration of encounter: 30 minutes.  Note by: Gillis Santa, MD Date: 11/21/2019; Time: 12:02 PM

## 2019-11-21 NOTE — Progress Notes (Signed)
Nursing Pain Medication Assessment:  Safety precautions to be maintained throughout the outpatient stay will include: orient to surroundings, keep bed in low position, maintain call bell within reach at all times, provide assistance with transfer out of bed and ambulation.  Medication Inspection Compliance: Pill count conducted under aseptic conditions, in front of the patient. Neither the pills nor the bottle was removed from the patient's sight at any time. Once count was completed pills were immediately returned to the patient in their original bottle.  Medication: Hydrocodone/APAP Pill/Patch Count: 14 of 60 pills remain Pill/Patch Appearance: Markings consistent with prescribed medication Bottle Appearance: Standard pharmacy container. Clearly labeled. Filled Date:09 / 15 / 2021 Last Medication intake:  Today

## 2019-11-27 LAB — TOXASSURE SELECT 13 (MW), URINE

## 2019-11-28 ENCOUNTER — Other Ambulatory Visit: Payer: Self-pay

## 2019-11-28 ENCOUNTER — Encounter: Payer: Self-pay | Admitting: Internal Medicine

## 2019-11-28 ENCOUNTER — Ambulatory Visit (INDEPENDENT_AMBULATORY_CARE_PROVIDER_SITE_OTHER): Payer: Medicare Other | Admitting: Internal Medicine

## 2019-11-28 VITALS — BP 130/62 | HR 69 | Ht 63.0 in | Wt 165.0 lb

## 2019-11-28 DIAGNOSIS — R0600 Dyspnea, unspecified: Secondary | ICD-10-CM | POA: Diagnosis not present

## 2019-11-28 DIAGNOSIS — R0602 Shortness of breath: Secondary | ICD-10-CM

## 2019-11-28 DIAGNOSIS — I208 Other forms of angina pectoris: Secondary | ICD-10-CM

## 2019-11-28 DIAGNOSIS — I10 Essential (primary) hypertension: Secondary | ICD-10-CM | POA: Diagnosis not present

## 2019-11-28 DIAGNOSIS — R0609 Other forms of dyspnea: Secondary | ICD-10-CM

## 2019-11-28 MED ORDER — CARVEDILOL 3.125 MG PO TABS
3.1250 mg | ORAL_TABLET | Freq: Two times a day (BID) | ORAL | 2 refills | Status: DC
Start: 2019-11-28 — End: 2020-01-13

## 2019-11-28 NOTE — Patient Instructions (Addendum)
Medication Instructions:  Your physician has recommended you make the following change in your medication:  1- STOP Clonidine. 2- START Carvedilol 3.125 mg (1 tablet) by mouth two times a day.   *If you need a refill on your cardiac medications before your next appointment, please call your pharmacy*  Lab Work: none If you have labs (blood work) drawn today and your tests are completely normal, you will receive your results only by: Marland Kitchen MyChart Message (if you have MyChart) OR . A paper copy in the mail If you have any lab test that is abnormal or we need to change your treatment, we will call you to review the results.  Testing/Procedures: Your physician has requested that you have a lexiscan myoview.   [place]  Your caregiver has ordered a Stress Test with nuclear imaging. The purpose of this test is to evaluate the blood supply to your heart muscle. This procedure is referred to as a "Non-Invasive Stress Test." This is because other than having an IV started in your vein, nothing is inserted or "invades" your body. Cardiac stress tests are done to find areas of poor blood flow to the heart by determining the extent of coronary artery disease (CAD). Some patients exercise on a treadmill, which naturally increases the blood flow to your heart, while others who are  unable to walk on a treadmill due to physical limitations have a pharmacologic/chemical stress agent called Lexiscan . This medicine will mimic walking on a treadmill by temporarily increasing your coronary blood flow.   Please note: these test may take anywhere between 2-4 hours to complete  PLEASE REPORT TO Jack AT THE FIRST DESK WILL DIRECT YOU WHERE TO GO  Date of Procedure:_____________________________________  Arrival Time for Procedure:______________________________   PLEASE NOTIFY THE OFFICE AT LEAST 24 HOURS IN ADVANCE IF YOU ARE UNABLE TO KEEP YOUR APPOINTMENT.   [phone] AND  PLEASE NOTIFY NUCLEAR MEDICINE AT [facility] AT LEAST 24 HOURS IN ADVANCE IF YOU ARE UNABLE TO KEEP YOUR APPOINTMENT. [phone]  How to prepare for your Myoview test:  1. Do not eat or drink after midnight 2. No caffeine for 24 hours prior to test 3. No smoking 24 hours prior to test. 4. Your medication may be taken with water.  If your doctor stopped a medication because of this test, do not take that medication. 5. Ladies, please do not wear dresses.  Skirts or pants are appropriate. Please wear a short sleeve shirt. 6. No perfume, cologne or lotion. 7. Wear comfortable walking shoes. No heels!   Follow-Up: At [facility], you and your health needs are our priority.  As part of our continuing mission to provide you with exceptional heart care, we have created designated Provider Care Teams.  These Care Teams include your primary Cardiologist (physician) and Advanced Practice Providers (APPs -  Physician Assistants and Nurse Practitioners) who all work together to provide you with the care you need, when you need it.  We recommend signing up for the patient portal called "MyChart".  Sign up information is provided on this After Visit Summary.  MyChart is used to connect with patients for Virtual Visits (Telemedicine).  Patients are able to view lab/test results, encounter notes, upcoming appointments, etc.  Non-urgent messages can be sent to your provider as well.   To learn more about what you can do with MyChart, go to [email or web address].    Your next appointment:   1 month(s)  The  format for your next appointment:   In Person  Provider:   You may see DR Harrell Gave END or one of the following Advanced Practice Providers on your designated Care Team:    Murray Hodgkins, NP  Christell Faith, PA-C  Marrianne Mood, PA-C  Cadence Kathlen Mody, [place]    Cardiac Nuclear Scan A cardiac nuclear scan is a test that measures blood flow to the heart when a person is  resting and when he or she is exercising. The test looks for problems such as:  Not enough blood reaching a portion of the heart.  The heart muscle not working normally. You may need this test if:  You have heart disease.  You have had abnormal lab results.  You have had heart surgery or a balloon procedure to open up blocked arteries (angioplasty).  You have chest pain.  You have shortness of breath. In this test, a radioactive dye (tracer) is injected into your bloodstream. After the tracer has traveled to your heart, an imaging device is used to measure how much of the tracer is absorbed by or distributed to various areas of your heart. This procedure is usually done at a hospital and takes 2-4 hours. Tell a health care provider about:  Any allergies you have.  All medicines you are taking, including vitamins, herbs, eye drops, creams, and over-the-counter medicines.  Any problems you or family members have had with anesthetic medicines.  Any blood disorders you have.  Any surgeries you have had.  Any medical conditions you have.  Whether you are pregnant or may be pregnant. What are the risks? Generally, this is a safe procedure. However, problems may occur, including:  Serious chest pain and heart attack. This is only a risk if the stress portion of the test is done.  Rapid heartbeat.  Sensation of warmth in your chest. This usually passes quickly.  Allergic reaction to the tracer. What happens before the procedure?  Ask your health care provider about changing or stopping your regular medicines. This is especially important if you are taking diabetes medicines or blood thinners.  Follow instructions from your health care provider about eating or drinking restrictions.  Remove your jewelry on the day of the procedure. What happens during the procedure?  An IV will be inserted into one of your veins.  Your health care provider will inject a small amount of  radioactive tracer through the IV.  You will wait for 20-40 minutes while the tracer travels through your bloodstream.  Your heart activity will be monitored with an electrocardiogram (ECG).  You will lie down on an exam table.  Images of your heart will be taken for about 15-20 minutes.  You may also have a stress test. For this test, one of the following may be done: ? You will exercise on a treadmill or stationary bike. While you exercise, your heart's activity will be monitored with an ECG, and your blood pressure will be checked. ? You will be given medicines that will increase blood flow to parts of your heart. This is done if you are unable to exercise.  When blood flow to your heart has peaked, a tracer will again be injected through the IV.  After 20-40 minutes, you will get back on the exam table and have more images taken of your heart.  Depending on the type of tracer used, scans may need to be repeated 3-4 hours later.  Your IV line will be removed when the procedure is over.  The procedure may vary among health care providers and hospitals. What happens after the procedure?  Unless your health care provider tells you otherwise, you may return to your normal schedule, including diet, activities, and medicines.  Unless your health care provider tells you otherwise, you may increase your fluid intake. This will help to flush the contrast dye from your body. Drink enough fluid to keep your urine pale yellow.  Ask your health care provider, or the department that is doing the test: ? When will my results be ready? ? How will I get my results? Summary  A cardiac nuclear scan measures the blood flow to the heart when a person is resting and when he or she is exercising.  Tell your health care provider if you are pregnant.  Before the procedure, ask your health care provider about changing or stopping your regular medicines. This is especially important if you are taking  diabetes medicines or blood thinners.  After the procedure, unless your health care provider tells you otherwise, increase your fluid intake. This will help flush the contrast dye from your body.  After the procedure, unless your health care provider tells you otherwise, you may return to your normal schedule, including diet, activities, and medicines. This information is not intended to replace advice given to you by your health care provider. Make sure you discuss any questions you have with your health care provider. Document Revised: 07/03/2017 Document Reviewed: 07/03/2017 Elsevier Patient Education  [employer].

## 2019-11-28 NOTE — Progress Notes (Signed)
Follow-up Outpatient Visit Date: 11/28/2019  Primary Care Provider: Crissie Figures, PA-C [place] [address]  Chief Complaint: Follow-up shortness of breath  HPI:  Jocelyn [name] is a 86 y.o. female with history of stroke, hypertension, hyperlipidemia, prediabetes, obstructive sleep apnea on CPAP, and GERD, who presents for follow-up of shortness of breath.  I met her last month, at which time she reported fairly sudden onset of shortness of breath 1 month before our visit.  Subsequent echocardiogram revealed normal LVEF with degenerative valve disease with mild mitral regurgitation.  Today, Jocelyn [name] reports that she is feeling better with less shortness of breath, though she still has some dyspnea on exertion.  She denies chest pain, palpitations, lightheadedness, and edema.  --------------------------------------------------------------------------------------------------  Cardiovascular History & Procedures: Cardiovascular Problems:  Stroke  Angina  Risk Factors:  Stroke, hypertension, hyperlipidemia, and prediabetes  Cath/PCI:  None  CV Surgery:  None  EP Procedures and Devices:  None  Non-Invasive Evaluation(s):  TTE (normal LV size with severe LVH.  LVEF 60-65% with grade 1 diastolic dysfunction.  Normal RV size and function.  Moderate left atrial enlargement.  Mild mitral regurgitation with moderate annular calcification.  Pharmacologic MPI (07/14/2010): Abnormal study with small, fixed apical defect.  LVEF 44% with global hypokinesis and more severe hypokinesis at the apex.  Recent CV Pertinent Labs: Lab Results  Component Value Date   CHOL 203 (H) 08/01/2018   CHOL 166 07/08/2015   HDL 65 08/01/2018   HDL 60 07/08/2015   LDLCALC 120 (H) 08/01/2018   TRIG 84 08/01/2018   CHOLHDL 3.1 08/01/2018   K 3.9 08/01/2018   K 3.4 (L) 04/13/2011   BUN 14 08/01/2018   BUN 24 07/08/2015   BUN 14 04/13/2011   CREATININE 0.80 08/01/2018     Past medical and surgical history were reviewed and updated in EPIC.  Current Meds  Medication Sig   albuterol (VENTOLIN HFA) 108 (90 Base) MCG/ACT inhaler Inhale 1 puff into the lungs as needed.   aspirin EC 81 MG tablet Take 1 tablet (81 mg total) by mouth daily.   calcium citrate-vitamin D 500-400 MG-UNIT chewable tablet Chew 1 tablet by mouth 2 (two) times daily.    cloNIDine (CATAPRES) 0.1 MG tablet Take 1 tablet (0.1 mg total) by mouth 2 (two) times daily.   diclofenac sodium (VOLTAREN) 1 % GEL Apply 4 g topically 4 (four) times daily as needed. On the knees   famotidine (PEPCID) 20 MG tablet TAKE 1 TABLET BY MOUTH 2 TIMES DAILY. IF NEEDED FOR HEARTBURN DO NOT TAKE RANITIDINE   ferrous sulfate 325 (65 FE) MG tablet Take 1 tablet (325 mg total) by mouth daily.   [START ON 12/21/2019] HYDROcodone-acetaminophen (NORCO/VICODIN) 5-325 MG tablet Take 1 tablet by mouth 2 (two) times daily as needed for moderate pain. For chronic pain syndrome   isosorbide mononitrate (IMDUR) 120 MG 24 hr tablet TAKE 1 TABLET BY MOUTH EVERY DAY   Olmesartan-amLODIPine-HCTZ 40-10-12.5 MG TABS Take 1 tablet by mouth daily. For blood pressure   omeprazole (PRILOSEC) 20 MG capsule Take 20 mg by mouth daily.   pravastatin (PRAVACHOL) 20 MG tablet TAKE 1 TABLET BY MOUTH EVERY DAY    Allergies: Patient has no known allergies.  Social History   Tobacco Use   Smoking status: Never Smoker   Smokeless tobacco: Never Used  Vaping Use   Vaping Use: Never used  Substance Use Topics   Alcohol use: No    Alcohol/week: 0.0 standard  drinks   Drug use: No    Family History  Problem Relation Age of Onset   Hypertension Daughter    Diabetes Daughter    Cancer Daughter        breast   Diabetes Mother    Heart disease Mother    Cancer Father        lung; smoker   Anuerysm Son     Review of Systems: A 12-system review of systems was performed and was negative except as noted in the  HPI.  --------------------------------------------------------------------------------------------------  Physical Exam: BP 130/62    Pulse 69    Ht 5' 3"  (1.6 m)    Wt 165 lb (74.8 kg)    BMI 29.23 kg/m   General: NAD.  Seated in a wheelchair. Neck: No JVD or HJR. Lungs: Mildly diminished breath sounds throughout without wheezes or crackles. Heart: Regular rate and rhythm with 2/6 systolic murmur.  No rubs or gallops. Abdomen: Soft, nontender, nondistended. Extremities: No lower extremity edema.  Lab Results  Component Value Date   WBC 8.6 08/01/2018   HGB 11.5 (L) 08/01/2018   HCT 35.1 08/01/2018   MCV 84.2 08/01/2018   PLT 192 08/01/2018    Lab Results  Component Value Date   NA 140 08/01/2018   K 3.9 08/01/2018   CL 103 08/01/2018   CO2 28 08/01/2018   BUN 14 08/01/2018   CREATININE 0.80 08/01/2018   GLUCOSE 87 08/01/2018   ALT 11 08/01/2018    Lab Results  Component Value Date   CHOL 203 (H) 08/01/2018   HDL 65 08/01/2018   LDLCALC 120 (H) 08/01/2018   TRIG 84 08/01/2018   CHOLHDL 3.1 08/01/2018    --------------------------------------------------------------------------------------------------  ASSESSMENT AND PLAN: Dyspnea on exertion and stable angina: Jocelyn [name] reports some improvement in her exertional dyspnea since our last visit.  Echocardiogram showed normal LVEF with grade 1 diastolic dysfunction and mild valvular disease.  Given concern for stable angina in the past, currently treated with long-acting nitrates, I have recommended obtaining a pharmacologic myocardial perfusion stress test to assess for high risk ischemia.  I wonder if clonidine could also be contributing to some of her symptoms.  I have recommended stopping clonidine and initiating carvedilol 3.125 mg twice daily.  Hypertension: Blood pressure borderline today.  I would like to wean Jocelyn [name] off clonidine, given its negative side effect profile.  We will therefore discontinue  clonidine and start carvedilol 3.125 mg twice daily.  Jocelyn [name] should continue her current dose of olmesartan-amlodipine-HCTZ as well as isosorbide mononitrate.  Follow-up: Return to clinic in 1 month.  Nelva Bush, MD 11/28/2019 11:14 AM

## 2019-11-29 ENCOUNTER — Encounter: Payer: Self-pay | Admitting: Internal Medicine

## 2019-12-12 ENCOUNTER — Ambulatory Visit
Admission: RE | Admit: 2019-12-12 | Discharge: 2019-12-12 | Disposition: A | Discharge status: Home / Self Care | Payer: Medicare Other | Source: Ambulatory Visit | Attending: Internal Medicine | Admitting: Internal Medicine

## 2019-12-12 ENCOUNTER — Other Ambulatory Visit: Payer: Self-pay

## 2019-12-12 DIAGNOSIS — R0600 Dyspnea, unspecified: Secondary | ICD-10-CM | POA: Diagnosis not present

## 2019-12-12 DIAGNOSIS — R0609 Other forms of dyspnea: Secondary | ICD-10-CM

## 2019-12-12 MED ORDER — TECHNETIUM TC 99M TETROFOSMIN IV KIT
10.0000 | PACK | Freq: Once | INTRAVENOUS | Status: AC | PRN
  Administered 2019-12-12: 10.58 via INTRAVENOUS

## 2019-12-12 MED ORDER — REGADENOSON 0.4 MG/5ML IV SOLN
0.4000 mg | Freq: Once | INTRAVENOUS | Status: AC
  Administered 2019-12-12: 0.4 mg via INTRAVENOUS

## 2019-12-12 MED ORDER — TECHNETIUM TC 99M TETROFOSMIN IV KIT
30.0000 | PACK | Freq: Once | INTRAVENOUS | Status: AC | PRN
  Administered 2019-12-12: 30.308 via INTRAVENOUS

## 2019-12-13 LAB — NM MYOCAR MULTI W/SPECT W/WALL MOTION / EF
LV dias vol: 116 mL (ref 46–106)
LV sys vol: 72 mL
Peak HR: 83 {beats}/min
Percent HR: 61 %
Rest HR: 61 {beats}/min
SDS: 0
SRS: 15
SSS: 11
TID: 0.98

## 2019-12-15 IMAGING — CR DG LUMBAR SPINE COMPLETE W/ BEND
7 series · 7 of 7 positions shown · non-contrast
Comparison: 03/01/2018 CT abdomen and pelvis.

CLINICAL DATA: 86-year-old female with bilateral knee and back pain
for years. No known injury. Initial encounter.

EXAM:
LUMBAR SPINE - COMPLETE WITH BENDING VIEWS

[l-spine ap]
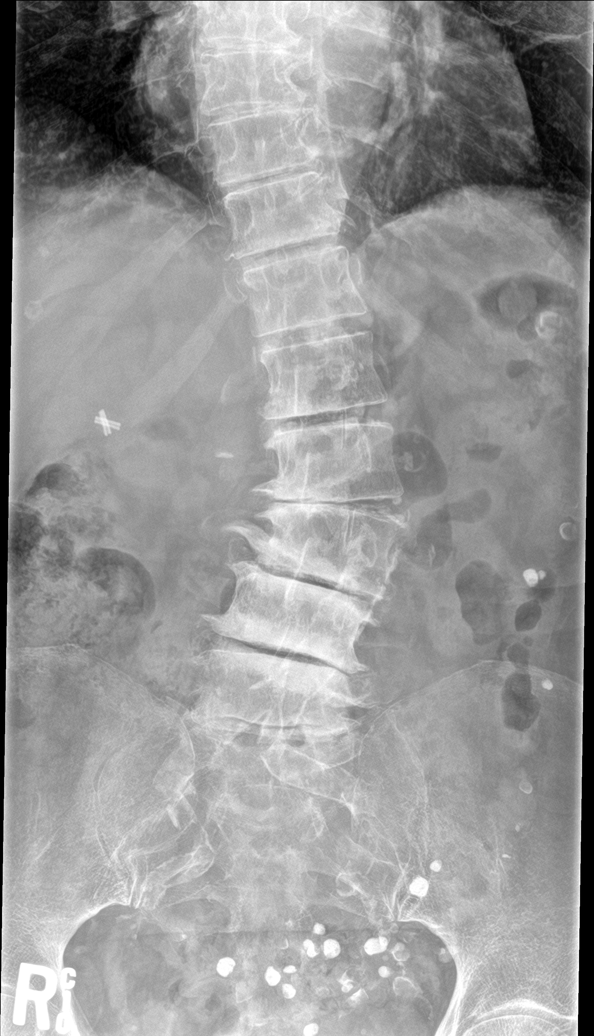

[l-spine obl (1 of 2)]
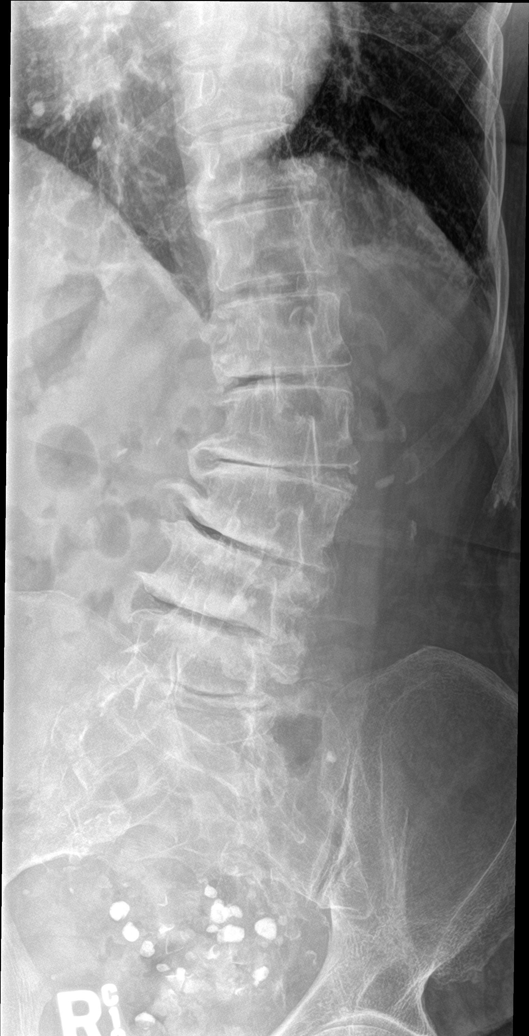

[l-spine obl (2 of 2)]
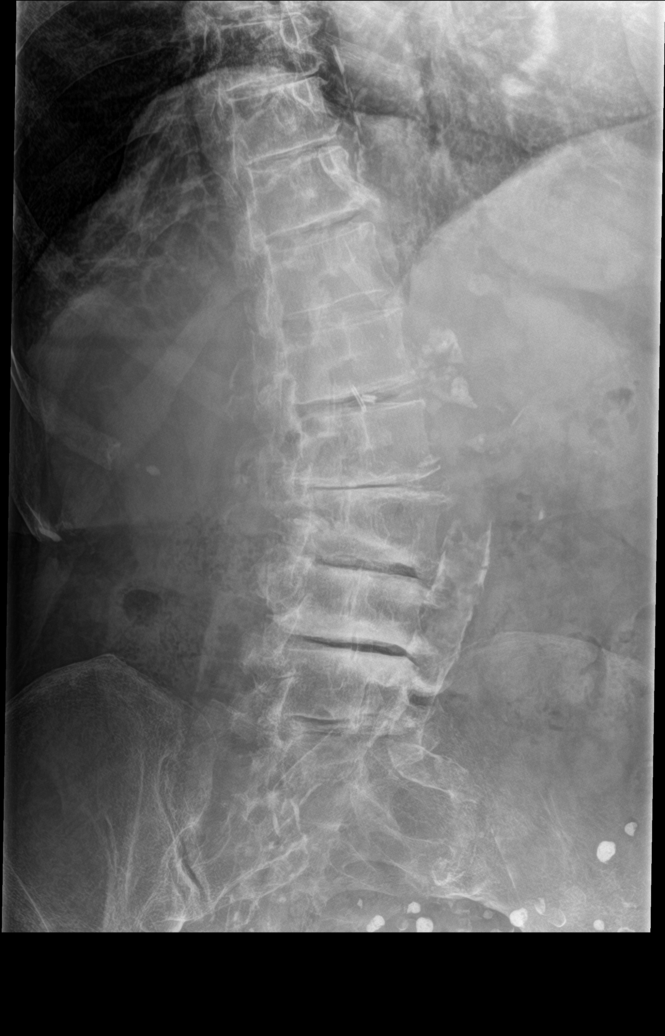

[l-spine lat]
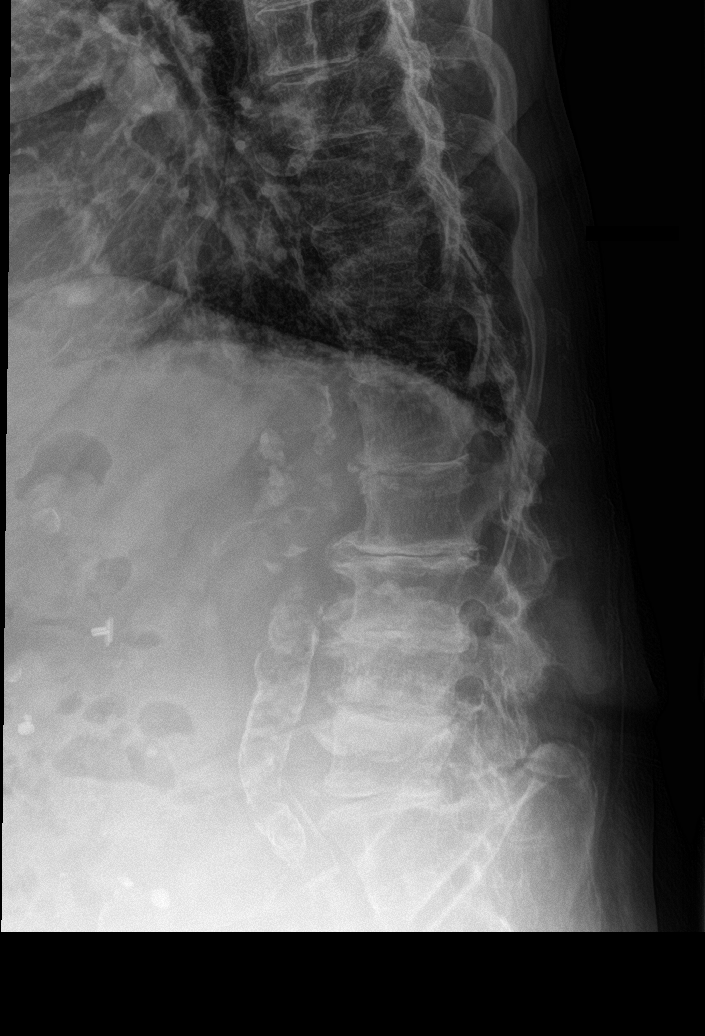

[l-spine flex]
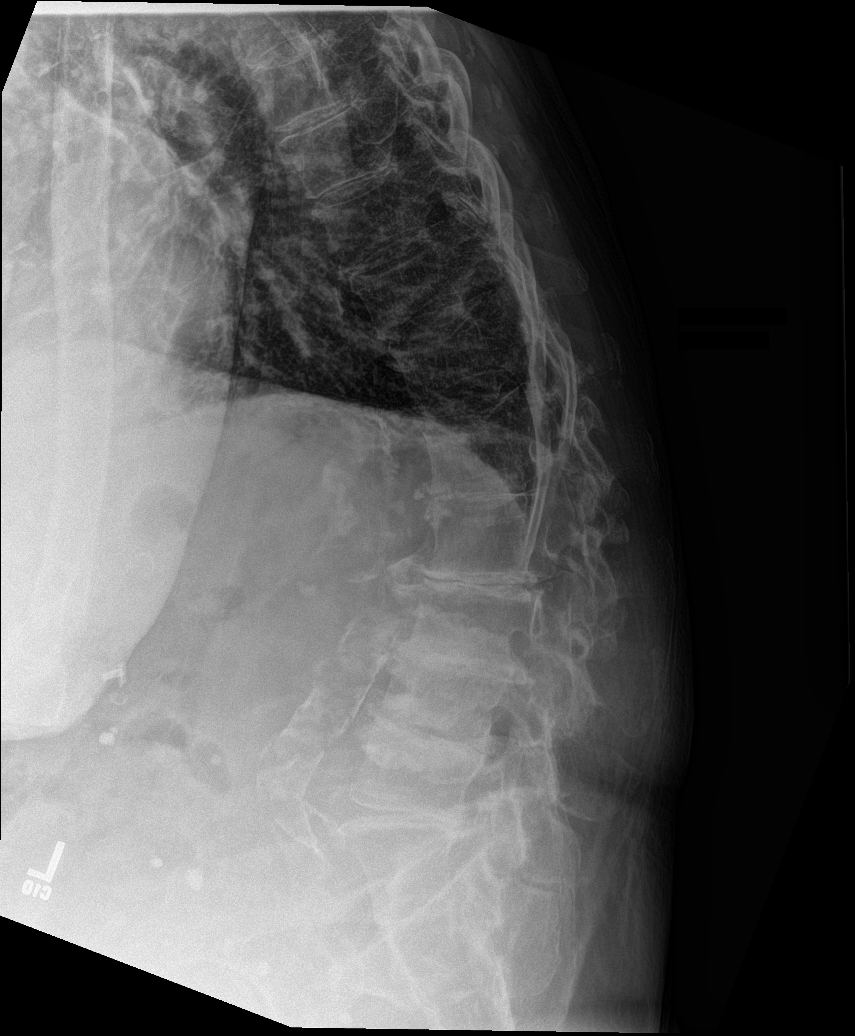

[l-spine ext]
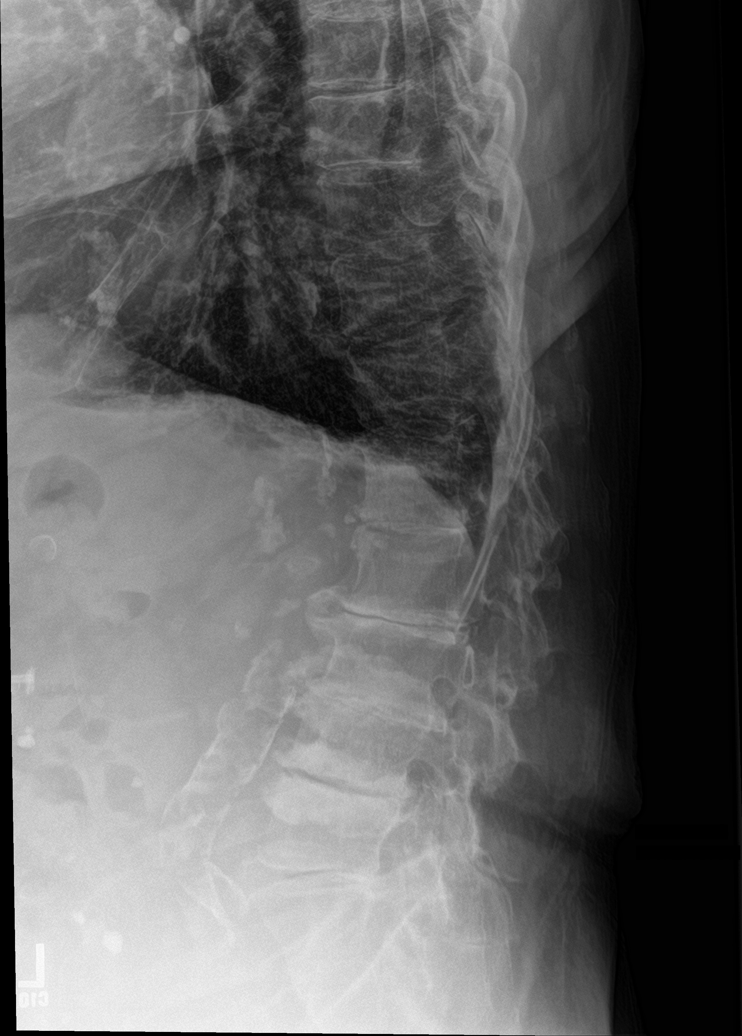

[l-spine spot]
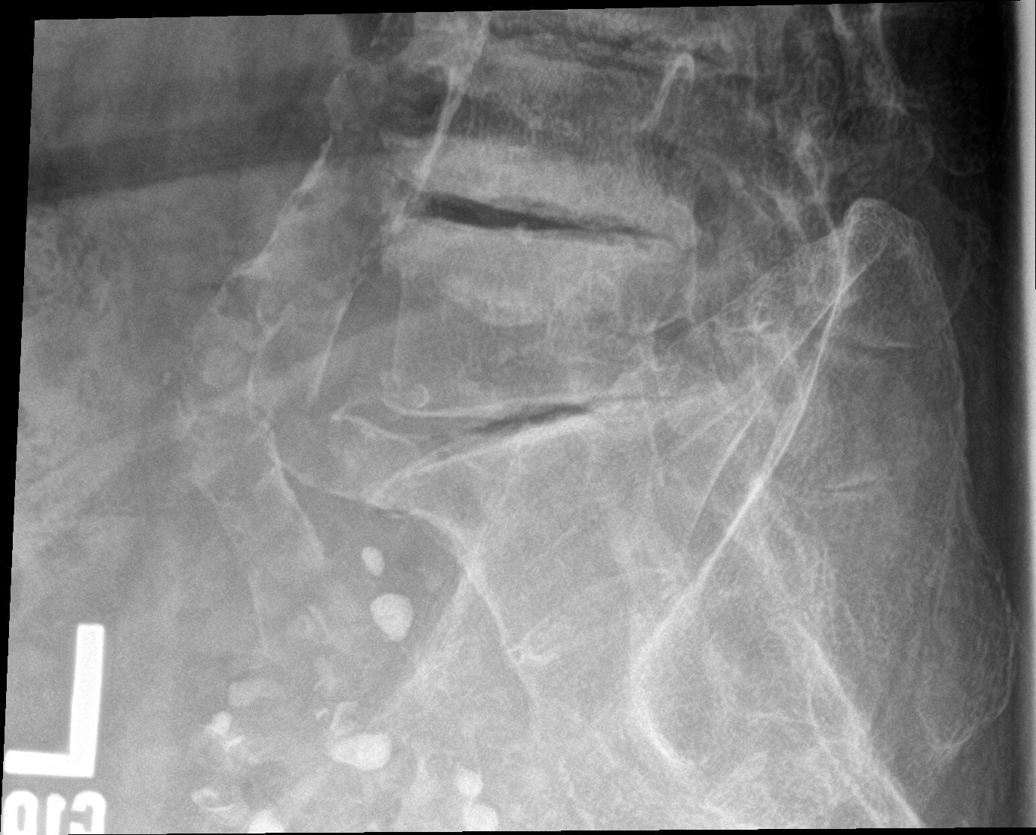

[7 of 7 positions shown; findings below may reference images not displayed]

FINDINGS: Scoliosis lumbar spine convex left with superimposed significant
degenerative changes. Disc space narrowing most notable on the right
at the L2-3 and L3-4 level and on the left at the L4-5 level.
Broad-based disc space narrowing L5-S1. No compression fracture or
pars defect. No abnormal motion between flexion and extension.

Vascular calcification. Retained contrast within colonic
diverticula.
IMPRESSION: 1. Scoliosis lumbar spine convex left with superimposed significant
degenerative changes. Disc space narrowing most notable on the right
at the L2-3 and L3-4 level and on the left at the L4-5 level.
Broad-based disc space narrowing L5-S1.
2. No abnormal motion between flexion and extension.
3.  Aortic Atherosclerosis ([id number]).

## 2019-12-15 IMAGING — CR DG KNEE 1-2V*L*
2 series · 2 of 2 positions shown · non-contrast
Comparison: None.

Addendum:
CLINICAL DATA: 86-year-old female with bilateral knee and back pain
for years. No known injury. Initial encounter.

EXAM:
LEFT KNEE - 1-2 VIEW

[knee ap]
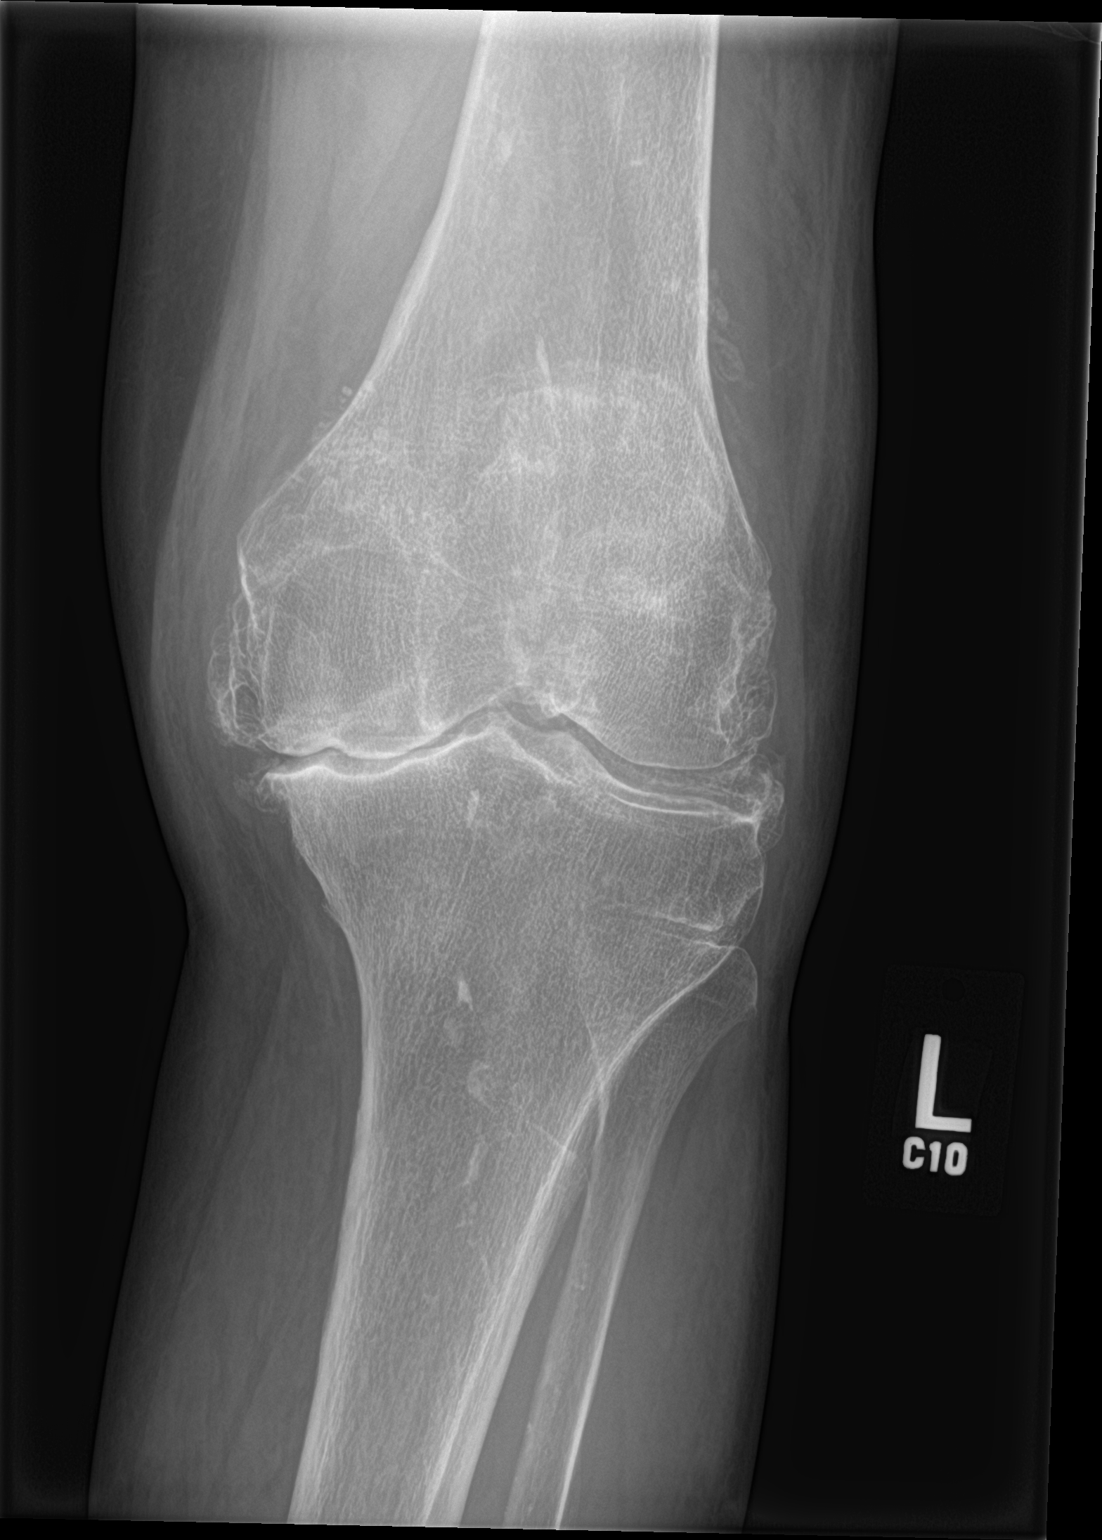

[knee lat]
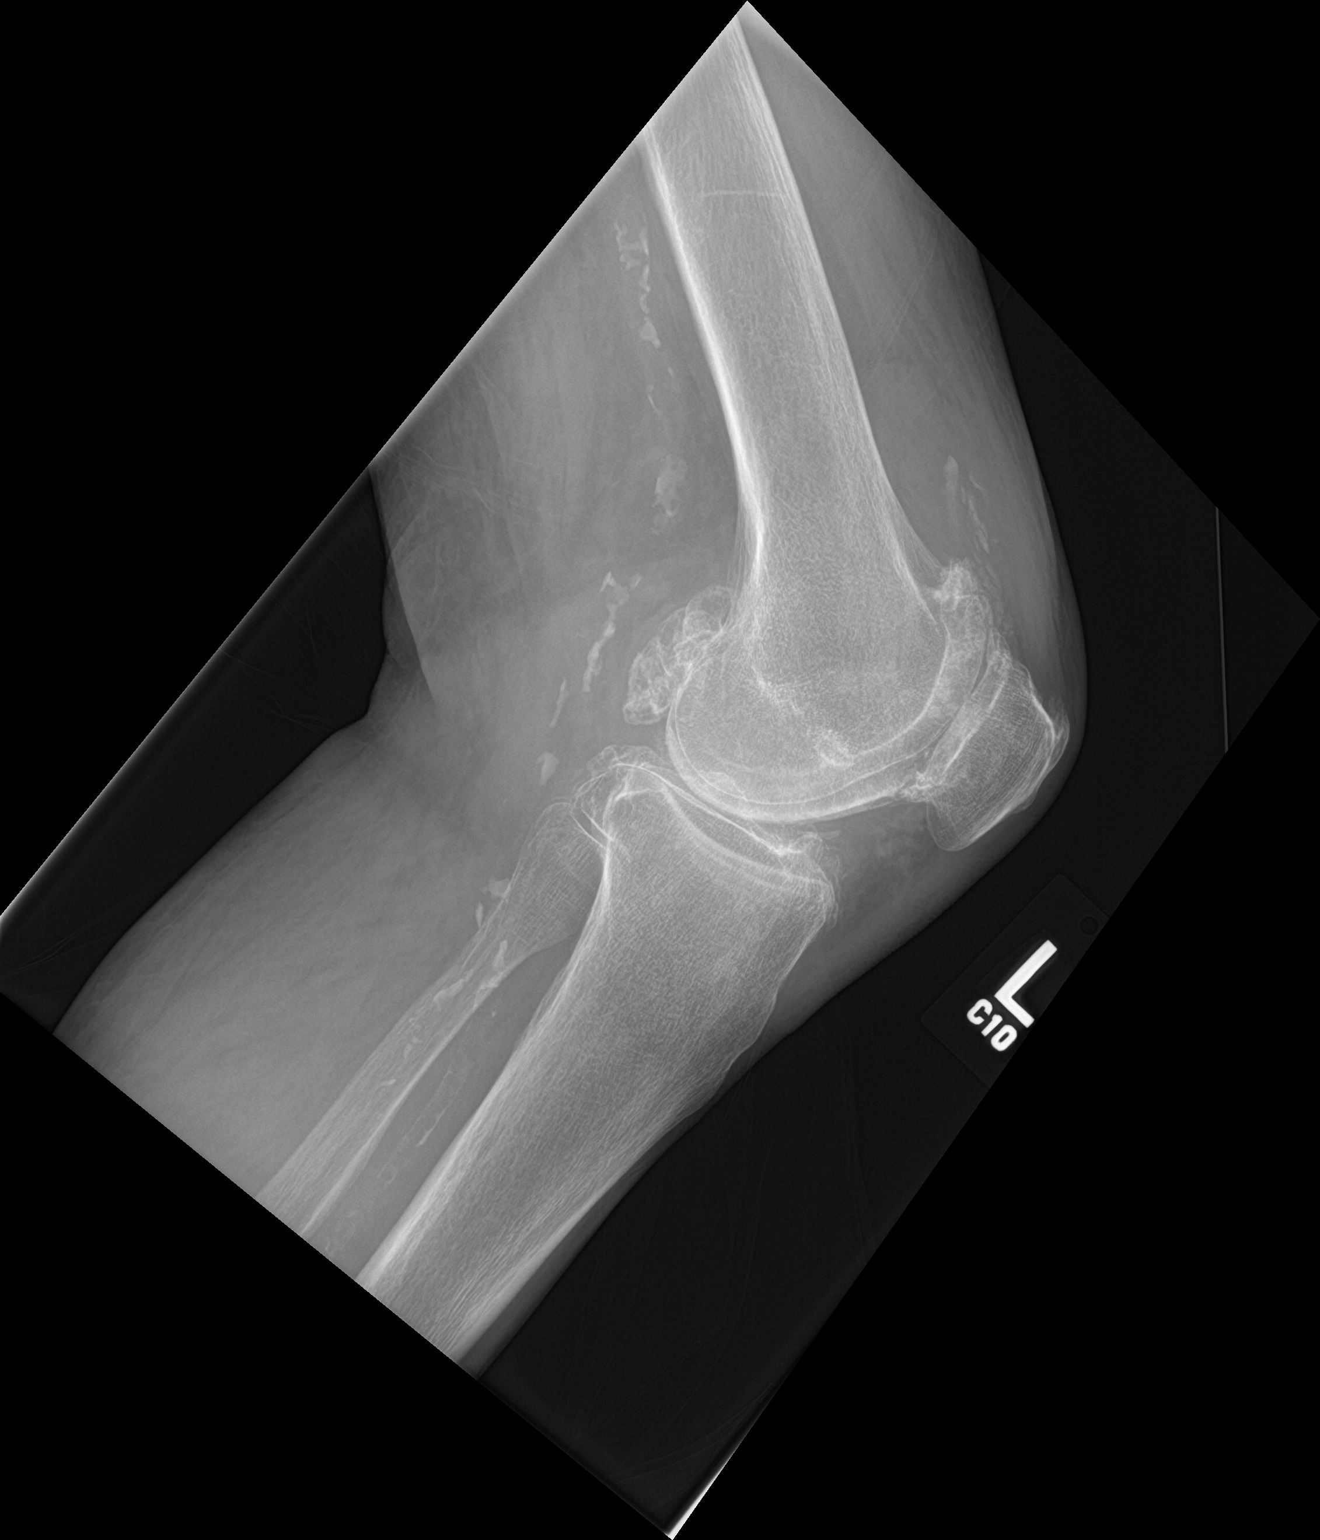

[2 of 2 positions shown; findings below may reference images not displayed]

FINDINGS: Marked medial tibiofemoral joint space and patellofemoral joint
degenerative changes. Moderate lateral tibiofemoral joint
degenerative changes.

Small to moderate-size joint effusion.

No fracture or dislocation.

Vascular calcifications.
IMPRESSION: 1. Marked medial tibiofemoral joint space and patellofemoral joint
degenerative changes. Moderate lateral tibiofemoral joint
degenerative changes.
2. Small to moderate-size joint effusion.

ADDENDUM:
Possible chondrocalcinosis.  Suspect loose bodies.

*** End of Addendum ***

## 2019-12-15 IMAGING — CR DG KNEE 1-2V*R*
2 series · 2 of 2 positions shown · non-contrast
Comparison: None.

CLINICAL DATA: 86-year-old female with bilateral knee and back pain
for years. No known injury. Initial encounter.

EXAM:
RIGHT KNEE - 1-2 VIEW

[knee ap]
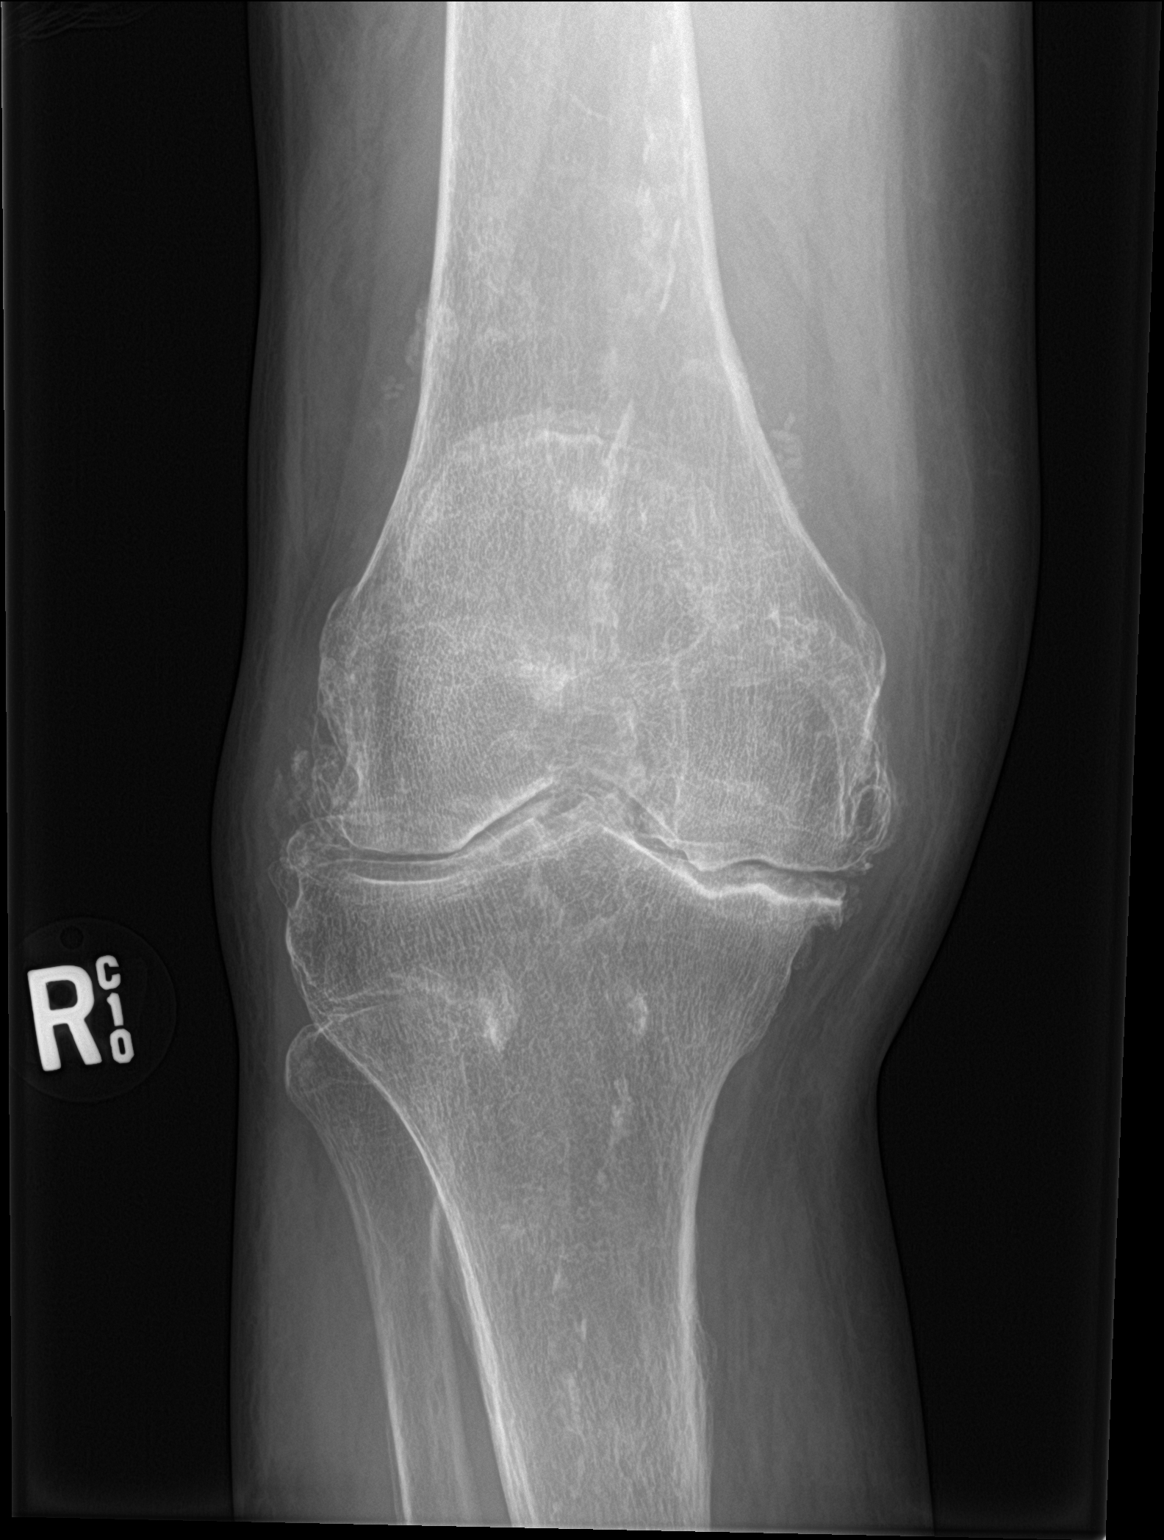

[knee lat]
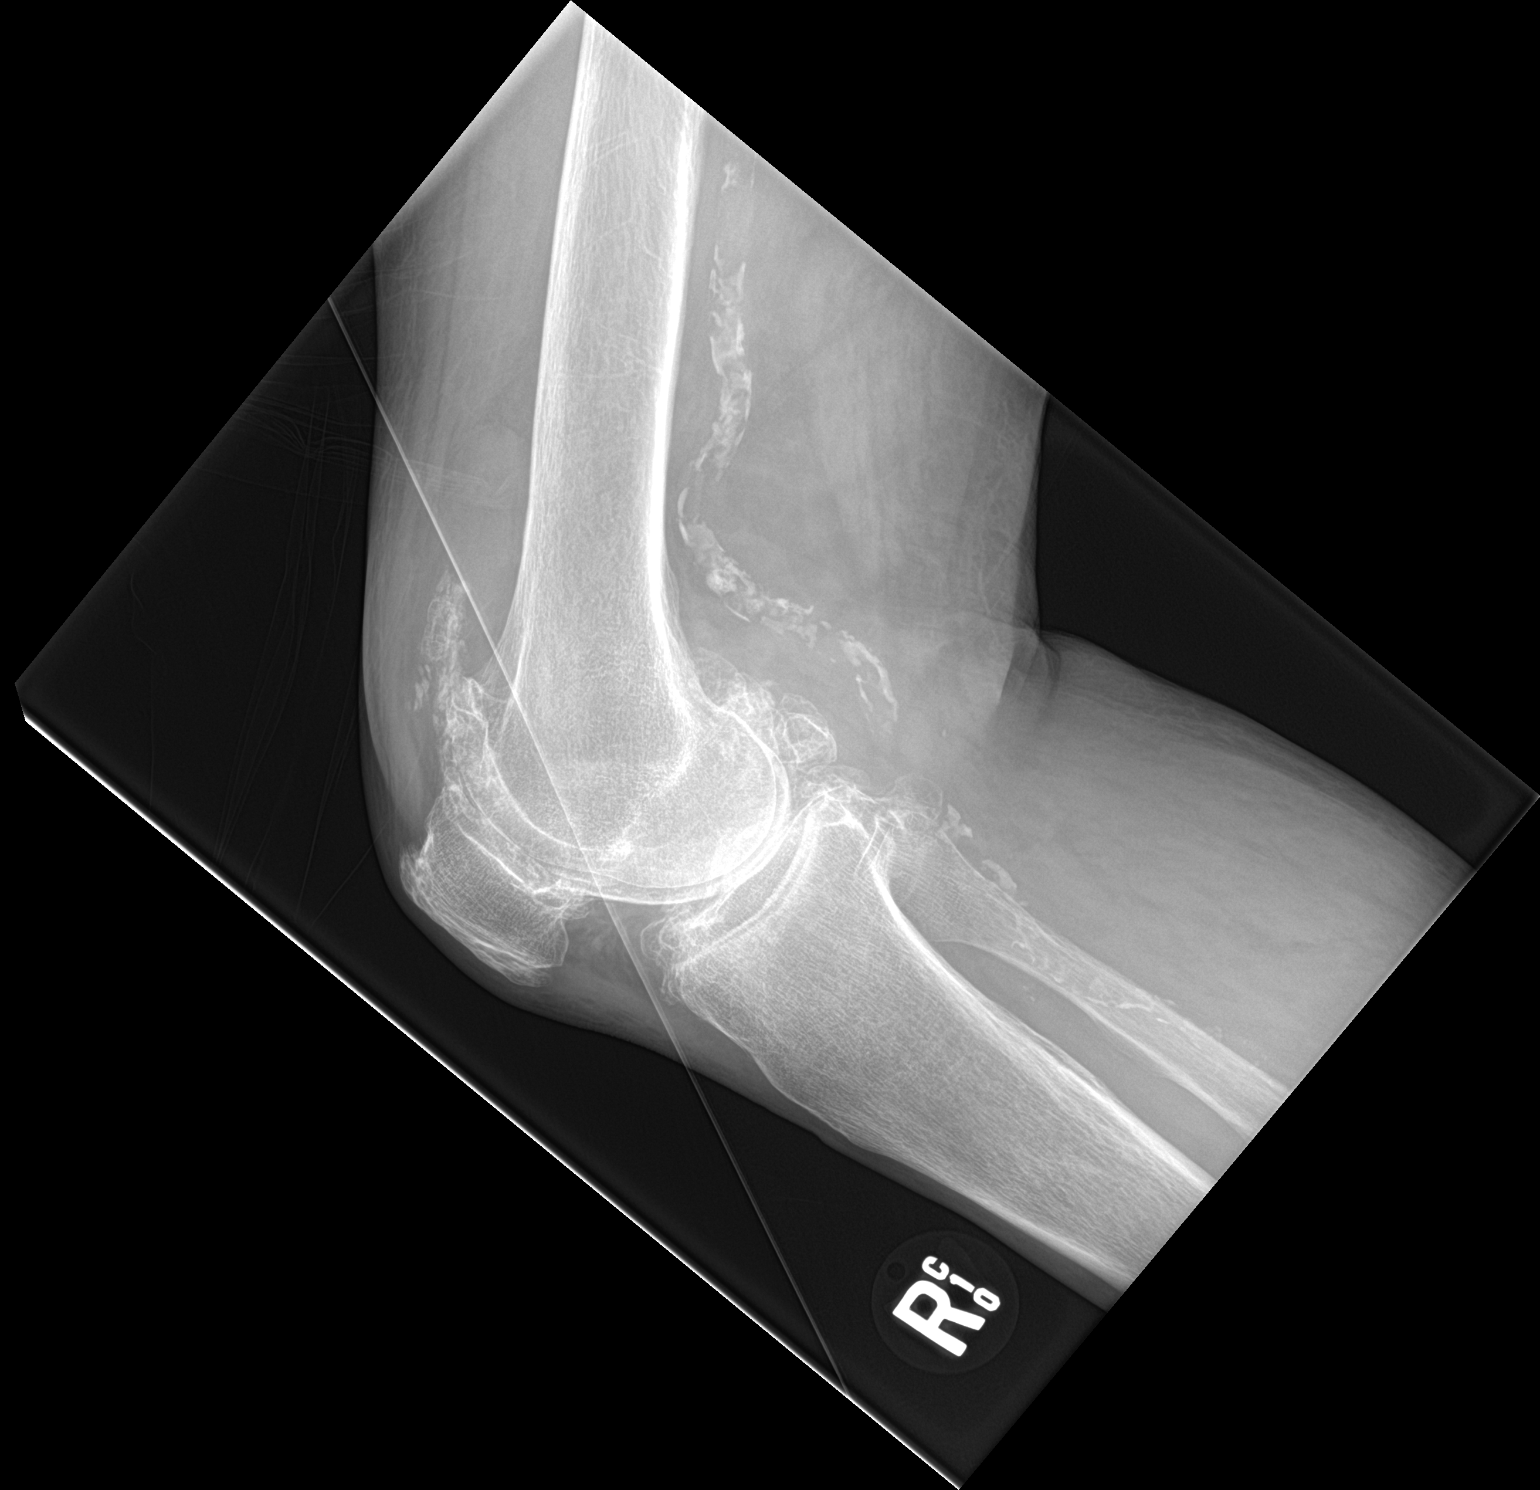

[2 of 2 positions shown; findings below may reference images not displayed]

FINDINGS: Marked medial tibiofemoral joint and patellofemoral joint
degenerative changes. Moderate lateral tibiofemoral joint
degenerative changes. Possible chondrocalcinosis. Suspect loose
bodies. Small to moderate-size joint effusion. No fracture or
dislocation. Vascular calcifications.
IMPRESSION: 1. Marked medial tibiofemoral joint and patellofemoral joint
degenerative changes. Moderate lateral tibiofemoral joint
degenerative changes.
2. Possible chondrocalcinosis.
3. Suspect loose bodies.
4. Small to moderate-size joint effusion.

## 2019-12-16 ENCOUNTER — Telehealth: Payer: Self-pay | Admitting: *Deleted

## 2019-12-16 NOTE — Telephone Encounter (Signed)
-----   Message from Nelva Bush, MD sent at 12/16/2019  2:47 PM EST ----- Please let Ms. Durante know that her stress test does not show evidence of a significant narrowing/blockage in her heart arteries.  I recommend that she continue her current medications and follow-up as previously arranged to reassess her symptoms.

## 2019-12-16 NOTE — Telephone Encounter (Signed)
Spoke to pt's dtr, Roland Rack (D[facility]) and notified her of stress test results below. Dtr verbalized understanding and will notify pt. No questions at this time.

## 2019-12-25 NOTE — Progress Notes (Signed)
Cardiology Office Note    Date:  12/30/2019   ID:  Jocelyn, [name] Dec 05, 1931, MRN [id number]  PCP:  Crissie Figures, PA-C  Cardiologist:  Nelva Bush, MD  Electrophysiologist:  None   Chief Complaint: Follow-up  History of Present Illness:   Jocelyn [name] is a 86 y.o. female with history of coronary artery calcification, aortic atherosclerosis, stroke, prediabetes, HTN, HLD, OSA on CPAP, and GERD who presents for follow-up of Lexiscan MPI.  Remote pharmacologic MPI in 07/2010 showed a small, fixed apical defect with an LVEF of 44% with global hypokinesis with more severe hypokinesis at the apex.  She was evaluated by Dr. Saunders Revel in 10/2019 and reported relatively sudden onset of shortness of breath that began in 09/2019. Echo in 11/2019 showed an EF of 60 to 62%, grade 1 diastolic dysfunction, normal RV systolic function and ventricular cavity size, mild left atrial enlargement, and mild mitral regurgitation with moderate mitral annular calcification. When she was seen in follow-up in late 11/2019 she reported she was feeling better with less shortness of breath though did still have some dyspnea on exertion. In this setting she underwent Lexiscan MPI on 12/13/2019 that showed no significant ischemia with normal wall motion and an EF estimated at 40% felt to be depressed secondary to GI uptake artifact. CT attenuation corrected images showed mild to moderate aortic atherosclerosis with significant mitral valve annular calcification and mild coronary artery calcification. Overall, this was a low risk scan.  She comes in today doing well from a cardiac perspective.  Since she was last seen she notes resolution of her exertional dyspnea outside of one isolated episode when she was cleaning her house.  She has tolerated the discontinuation of clonidine and initiation of carvedilol without issues.  She denies any chest pain, palpitations, dizziness, presyncope, syncope, lower extremity  swelling, abdominal distention, orthopnea, PND, or early satiety.  No falls.  She does not have any issues or concerns at this time.   Labs independently reviewed: 02/2019 - Hgb 12.2, PLT 186, BUN 23, serum creatinine 0.84, potassium 4.1, albumin 4.4, AST/ALT normal, TC 205, TG 68, HDL 63, LDL 130, A1c 5.8 01/2018 - TSH normal  Past Medical History:  Diagnosis Date  . Arthritis of both knees 08/01/2015  . Colitis   . Essential hypertension, benign 07/28/2014  . GERD (gastroesophageal reflux disease)   . Hx of iron deficiency anemia 09/02/2015  . Hyperlipidemia   . Hypertension   . Lumbar pain   . Medicare annual wellness visit, subsequent 04/07/2017  . OSA on CPAP 08/01/2015  . Overweight (BMI 25.0-29.9) 07/10/2013  . Prediabetes 09/02/2015  . SOB (shortness of breath) 07/10/2013    Past Surgical History:  Procedure Laterality Date  . ABDOMINAL HYSTERECTOMY    . CATARACT EXTRACTION    . CHOLECYSTECTOMY    . COLONOSCOPY WITH PROPOFOL N/A 04/09/2018   Procedure: COLONOSCOPY WITH PROPOFOL;  Surgeon: Lin Landsman, MD;  Location[facility] ENDOSCOPY;  Service: Gastroenterology;  Laterality: N/A;  . LUMBAR DISC SURGERY      Current Medications: Current Meds  Medication Sig  . albuterol (VENTOLIN HFA) 108 (90 Base) MCG/ACT inhaler Inhale 1 puff into the lungs as needed.  Marland Kitchen aspirin EC 81 MG tablet Take 1 tablet (81 mg total) by mouth daily.  . calcium citrate-vitamin D 500-400 MG-UNIT chewable tablet Chew 1 tablet by mouth 2 (two) times daily.   . carvedilol (COREG) 3.125 MG tablet Take 1 tablet (3.125 mg total) by mouth 2 (  two) times daily.  . diclofenac sodium (VOLTAREN) 1 % GEL Apply 4 g topically 4 (four) times daily as needed. On the knees  . ferrous sulfate 325 (65 FE) MG tablet Take 1 tablet (325 mg total) by mouth daily.  Marland Kitchen HYDROcodone-acetaminophen (NORCO/VICODIN) 5-325 MG tablet Take 1 tablet by mouth 2 (two) times daily as needed for moderate pain. For chronic pain syndrome  .  isosorbide mononitrate (IMDUR) 120 MG 24 hr tablet TAKE 1 TABLET BY MOUTH EVERY DAY  . Olmesartan-amLODIPine-HCTZ 40-10-12.5 MG TABS Take 1 tablet by mouth daily. For blood pressure  . omeprazole (PRILOSEC) 20 MG capsule Take 20 mg by mouth daily.  . [DISCONTINUED] pravastatin (PRAVACHOL) 20 MG tablet TAKE 1 TABLET BY MOUTH EVERY DAY    Allergies:   Patient has no known allergies.   Social History   Socioeconomic History  . Marital status: Widowed    Spouse name: Not on file  . Number of children: 5  . Years of education: Not on file  . Highest education level: Not on file  Occupational History  . Not on file  Tobacco Use  . Smoking status: Never Smoker  . Smokeless tobacco: Never Used  Vaping Use  . Vaping Use: Never used  Substance and Sexual Activity  . Alcohol use: No    Alcohol/week: 0.0 standard drinks  . Drug use: No  . Sexual activity: Not Currently  Other Topics Concern  . Not on file  Social History Narrative  . Not on file   Social Determinants of Health   Financial Resource Strain:   . Difficulty of Paying Living Expenses: Not on file  Food Insecurity:   . Worried About Charity fundraiser in the Last Year: Not on file  . Ran Out of Food in the Last Year: Not on file  Transportation Needs:   . Lack of Transportation (Medical): Not on file  . Lack of Transportation (Non-Medical): Not on file  Physical Activity:   . Days of Exercise per Week: Not on file  . Minutes of Exercise per Session: Not on file  Stress:   . Feeling of Stress : Not on file  Social Connections:   . Frequency of Communication with Friends and Family: Not on file  . Frequency of Social Gatherings with Friends and Family: Not on file  . Attends Religious Services: Not on file  . Active Member of Clubs or Organizations: Not on file  . Attends Archivist Meetings: Not on file  . Marital Status: Not on file     Family History:  The patient's family history includes Anuerysm  in her son; Cancer in her daughter and father; Diabetes in her daughter and mother; Heart disease in her mother; Hypertension in her daughter.  ROS:   Review of Systems  Constitutional: Positive for malaise/fatigue. Negative for chills, diaphoresis, fever and weight loss.  HENT: Negative for congestion.   Eyes: Negative for discharge and redness.  Respiratory: Negative for cough, sputum production, shortness of breath and wheezing.   Cardiovascular: Negative for chest pain, palpitations, orthopnea, claudication, leg swelling and PND.  Gastrointestinal: Negative for abdominal pain, heartburn, nausea and vomiting.  Musculoskeletal: Negative for falls and myalgias.  Skin: Negative for rash.  Neurological: Negative for dizziness, tingling, tremors, sensory change, speech change, focal weakness, loss of consciousness and weakness.  Endo/Heme/Allergies: Does not bruise/bleed easily.  Psychiatric/Behavioral: Negative for substance abuse. The patient is not nervous/anxious.   All other systems reviewed and are negative.  EKGs/Labs/Other Studies Reviewed:    Studies reviewed were summarized above. The additional studies were reviewed today:  2D echo 11/01/2019: 1. Left ventricular ejection fraction, by estimation, is 60 to 65%. The  left ventricle has normal function. The left ventricle has no regional  wall motion abnormalities. There is severe left ventricular hypertrophy.  Left ventricular diastolic parameters  are consistent with Grade I diastolic dysfunction (impaired relaxation).  2. Right ventricular systolic function is normal. The right ventricular  size is normal. Tricuspid regurgitation signal is inadequate for assessing  PA pressure.  3. Left atrial size was moderately dilated.  4. Mild mitral valve regurgitation. Moderate mitral annular calcification. __________  Carl[facility] Adam MPI 12/13/2019: Pharmacological myocardial perfusion imaging study with no significant   ischemia Normal wall motion, EF estimated at 43% (likely depressed secondary to GI uptake artifact) No EKG changes concerning for ischemia at peak stress or in recovery. CT attenuation correction images with mild to moderate aortic atherosclerosis, significant mitral valve annular calcification, mild coronary calcification Low risk scan   EKG:  EKG is not ordered today.    Recent Labs: No results found for requested labs within last 8760 hours.  Recent Lipid Panel    Component Value Date/Time   CHOL 203 (H) 08/01/2018 1544   CHOL 166 07/08/2015 1136   TRIG 84 08/01/2018 1544   HDL 65 08/01/2018 1544   HDL 60 07/08/2015 1136   CHOLHDL 3.1 08/01/2018 1544   VLDL 18 07/07/2016 1144   LDLCALC 120 (H) 08/01/2018 1544    PHYSICAL EXAM:    VS:  BP (!) 126/56 (BP Location: Right Arm, Patient Position: Sitting, Cuff Size: Normal)   Pulse 60   Ht 5\' 3"  (1.6 m)   Wt 161 lb (73 kg)   SpO2 95%   BMI 28.52 kg/m   BMI: Body mass index is 28.52 kg/m.  Physical Exam Vitals reviewed.  Constitutional:      Appearance: She is well-developed.  HENT:     Head: Normocephalic and atraumatic.  Eyes:     General:        Right eye: No discharge.        Left eye: No discharge.  Neck:     Vascular: No JVD.  Cardiovascular:     Rate and Rhythm: Normal rate and regular rhythm.     Pulses: No midsystolic click and no opening snap.          Posterior tibial pulses are 2+ on the right side and 2+ on the left side.     Heart sounds: S1 normal and S2 normal. Heart sounds not distant. Murmur heard.  Systolic murmur is present with a grade of 2/6.  No friction rub.  Pulmonary:     Effort: Pulmonary effort is normal. No respiratory distress.     Breath sounds: Normal breath sounds. No decreased breath sounds, wheezing or rales.  Chest:     Chest wall: No tenderness.  Abdominal:     General: There is no distension.     Palpations: Abdomen is soft.     Tenderness: There is no abdominal  tenderness.  Musculoskeletal:     Cervical back: Normal range of motion.  Skin:    General: Skin is warm and dry.     Nails: There is no clubbing.  Neurological:     Mental Status: She is alert and oriented to person, place, and time.  Psychiatric:        Speech: Speech normal.  Behavior: Behavior normal.        Thought Content: Thought content normal.        Judgment: Judgment normal.     Wt Readings from Last 3 Encounters:  12/30/19 161 lb (73 kg)  11/28/19 165 lb (74.8 kg)  11/21/19 164 lb (74.4 kg)     ASSESSMENT & PLAN:   1. Exertional dyspnea/stable angina: Symptoms of exertional dyspnea resolved.  She notes no angina at this time.  Work-up has been reassuring including echo which showed a normal LVEF with grade 1 diastolic dysfunction and mild valvular heart disease as outlined above as well as a Lexiscan MPI which showed no significant ischemia and was overall a low risk study.  Continue current medical therapy including aspirin, carvedilol, and Imdur.  No indication for further ischemic testing at this time.  2. HTN: Blood pressure is well controlled in the office.  Continue carvedilol, isosorbide mononitrate, and olmesartan/amlodipine/HCTZ.  3. HLD: LDL 130 from 02/2019 with normal LFT at that time.  She remains on pravastatin which is followed by her PCP.  Goal LDL less than 70.  Stop pravastatin, start atorvastatin 40 mg daily.  When she is seen in follow-up in 2 months time recommend obtaining LDL and ALT.  Disposition: F/u with Dr. Saunders Revel or an APP in 2 months.   Medication Adjustments/Labs and Tests Ordered: Current medicines are reviewed at length with the patient today.  Concerns regarding medicines are outlined above. Medication changes, Labs and Tests ordered today are summarized above and listed in the Patient Instructions accessible in Encounters.   Signed, Christell Faith, PA-C 12/30/2019 1:45 PM     [place] [address] [place], [place] [address] [phone]

## 2019-12-30 ENCOUNTER — Encounter: Payer: Self-pay | Admitting: Physician Assistant

## 2019-12-30 ENCOUNTER — Other Ambulatory Visit: Payer: Self-pay

## 2019-12-30 ENCOUNTER — Ambulatory Visit (INDEPENDENT_AMBULATORY_CARE_PROVIDER_SITE_OTHER): Payer: Medicare Other | Admitting: Physician Assistant

## 2019-12-30 VITALS — BP 126/56 | HR 60 | Ht 63.0 in | Wt 161.0 lb

## 2019-12-30 DIAGNOSIS — I10 Essential (primary) hypertension: Secondary | ICD-10-CM | POA: Diagnosis not present

## 2019-12-30 DIAGNOSIS — E785 Hyperlipidemia, unspecified: Secondary | ICD-10-CM

## 2019-12-30 DIAGNOSIS — R0600 Dyspnea, unspecified: Secondary | ICD-10-CM | POA: Diagnosis not present

## 2019-12-30 DIAGNOSIS — R0609 Other forms of dyspnea: Secondary | ICD-10-CM

## 2019-12-30 DIAGNOSIS — I208 Other forms of angina pectoris: Secondary | ICD-10-CM

## 2019-12-30 MED ORDER — ATORVASTATIN CALCIUM 40 MG PO TABS
40.0000 mg | ORAL_TABLET | Freq: Every day | ORAL | 1 refills | Status: DC
Start: 2019-12-30 — End: 2020-06-25

## 2019-12-30 NOTE — Patient Instructions (Signed)
Medication Instructions:  Your physician has recommended you make the following change in your medication:   1) STOP Pravastatin  2) START Atorvastatin (Lipitor) 40mg  ONE tablet Daily  *If you need a refill on your cardiac medications before your next appointment, please call your pharmacy*   Lab Work: None ordered   Testing/Procedures: None ordered   Follow-Up: At [facility], you and your health needs are our priority.  As part of our continuing mission to provide you with exceptional heart care, we have created designated Provider Care Teams.  These Care Teams include your primary Cardiologist (physician) and Advanced Practice Providers (APPs -  Physician Assistants and Nurse Practitioners) who all work together to provide you with the care you need, when you need it.  We recommend signing up for the patient portal called "MyChart".  Sign up information is provided on this After Visit Summary.  MyChart is used to connect with patients for Virtual Visits (Telemedicine).  Patients are able to view lab/test results, encounter notes, upcoming appointments, etc.  Non-urgent messages can be sent to your provider as well.   To learn more about what you can do with MyChart, go to [email or web address].    Your next appointment:   2 month(s)  The format for your next appointment:   In Person  Provider:   You may see Nelva Bush, MD or one of the following Advanced Practice Providers on your designated Care Team:    Murray Hodgkins, NP  Christell Faith, PA-C  Marrianne Mood, PA-C  Cadence Brandt, [place]  Laurann Montana, NP

## 2020-01-01 ENCOUNTER — Ambulatory Visit: Payer: Medicare Other | Admitting: Pulmonary Disease

## 2020-01-13 ENCOUNTER — Other Ambulatory Visit: Payer: Self-pay | Admitting: Internal Medicine

## 2020-01-13 NOTE — Telephone Encounter (Signed)
Rx request sent to pharmacy.  

## 2020-01-15 ENCOUNTER — Ambulatory Visit (INDEPENDENT_AMBULATORY_CARE_PROVIDER_SITE_OTHER): Payer: Medicare Other | Admitting: Pulmonary Disease

## 2020-01-15 ENCOUNTER — Encounter: Payer: Self-pay | Admitting: Pulmonary Disease

## 2020-01-15 ENCOUNTER — Other Ambulatory Visit: Payer: Self-pay

## 2020-01-15 VITALS — BP 116/60 | HR 54 | Temp 97.3°F | Ht 63.0 in | Wt 163.4 lb

## 2020-01-15 DIAGNOSIS — G4733 Obstructive sleep apnea (adult) (pediatric): Secondary | ICD-10-CM

## 2020-01-15 DIAGNOSIS — Z9989 Dependence on other enabling machines and devices: Secondary | ICD-10-CM

## 2020-01-15 NOTE — Progress Notes (Signed)
[facility]  Chief Complaint  Patient presents with  . Follow-up    Wears CPAP doing good, mask doesn't fit well    Constitutional:  BP 116/60 (BP Location: Left Arm, Cuff Size: Normal)   Pulse (!) 54   Temp (!) 97.3 F (36.3 C) (Temporal)   Ht 5\' 3"  (1.6 m)   Wt 163 lb 6.4 oz (74.1 kg)   SpO2 98%   BMI 28.95 kg/m   Past Medical History:  Colitis, HTN, GERD, Iron deficiency anemia, HLD, HTN, Pre-diabetes  Past Surgical History:  Her  has a past surgical history that includes Cataract extraction; Abdominal hysterectomy; Cholecystectomy; Lumbar disc surgery; and Colonoscopy with propofol (N/A, 04/09/2018).  Brief Summary:  Jocelyn [name] is a 86 y.o. female with obstructive sleep apnea.      Subjective:   She is here with family member.  She uses CPAP nightly.  Pressure setting okay.  Has trouble with mask leak.  She has full face mask.  Thinks she needs bigger size mask.  Occasionally has trouble swallowing food.  Physical Exam:   Appearance - well kempt   ENMT - no sinus tenderness, no oral exudate, no LAN, Mallampati 4 airway, no stridor, wears dentures  Respiratory - equal breath sounds bilaterally, no wheezing or rales  CV - s1s2 regular rate and rhythm, no murmurs  Ext - no clubbing, no edema  Skin - no rashes  Psych - normal mood and affect   Sleep Tests:   PSG 06/07/17 >> AHI 34.4, SpO2 low 71.5%  Auto CPAP 06/03/19 to 07/02/19 >> used on 30 of 30 nights with average 6 hrs 36 min.  Average AHI 11.3 with median CPAP 12 and 95 th percentile CPAP 17 cm H2O  Cardiac Tests:   Echo 11/01/19 >> EF 60 to 65%, grade 1 DD, mod LA dilation, mild MR  Social History:  She  reports that she has never smoked. She has never used smokeless tobacco. She reports that she does not drink alcohol and does not use drugs.  Family History:  Her family history includes Anuerysm in her son; Cancer in her daughter and father;  Diabetes in her daughter and mother; Heart disease in her mother; Hypertension in her daughter.     Assessment/Plan:   Obstructive sleep apnea. - she is compliant with CPAP and reports benefit - uses [facility] for her DME - continue auto CPAP 8 to 18 cm H2O - will have her DME refit her CPAP mask to see if this improves issue with mask leak  Trouble swallowing food. - advised her to cut her food into smaller pieces, chew food completely before swallowing, sip water after swallowing, and allow time between each bite - if progresses, then advised her to discuss with her PCP  Time Spent Involved in Patient Care on Day of Examination:  22 minutes  Follow up:  Patient Instructions  Will have [facility] refit your CPAP mask  Follow up in 1 year in [place]   Medication List:   Allergies as of 01/15/2020   No Known Allergies     Medication List       Accurate as of January 15, 2020 11:57 AM. If you have any questions, ask your nurse or doctor.        albuterol 108 (90 Base) MCG/ACT inhaler Commonly known as: VENTOLIN HFA Inhale 1 puff into the lungs as needed.   aspirin EC 81 MG tablet Take 1  tablet (81 mg total) by mouth daily.   atorvastatin 40 MG tablet Commonly known as: Lipitor Take 1 tablet (40 mg total) by mouth daily.   calcium citrate-vitamin D 500-400 MG-UNIT chewable tablet Chew 1 tablet by mouth 2 (two) times daily.   carvedilol 3.125 MG tablet Commonly known as: COREG TAKE 1 TABLET BY MOUTH 2 TIMES DAILY.   diclofenac sodium 1 % Gel Commonly known as: VOLTAREN Apply 4 g topically 4 (four) times daily as needed. On the knees   ferrous sulfate 325 (65 FE) MG tablet Take 1 tablet (325 mg total) by mouth daily.   HYDROcodone-acetaminophen 5-325 MG tablet Commonly known as: NORCO/VICODIN Take 1 tablet by mouth 2 (two) times daily as needed for moderate pain. For chronic pain syndrome   isosorbide mononitrate 120 MG 24 hr tablet Commonly known  as: IMDUR TAKE 1 TABLET BY MOUTH EVERY DAY   Olmesartan-amLODIPine-HCTZ 40-10-12.5 MG Tabs Take 1 tablet by mouth daily. For blood pressure   omeprazole 20 MG capsule Commonly known as: PRILOSEC Take 20 mg by mouth daily.       Signature:  Chesley Mires, MD [place] Pager - [phone] 01/15/2020, 11:57 AM

## 2020-01-15 NOTE — Patient Instructions (Signed)
Will have [facility] refit your CPAP mask  Follow up in 1 year in [facility]

## 2020-02-20 ENCOUNTER — Ambulatory Visit
Payer: Medicare Other | Attending: Student in an Organized Health Care Education/Training Program | Admitting: Student in an Organized Health Care Education/Training Program

## 2020-02-20 ENCOUNTER — Encounter: Payer: Self-pay | Admitting: Student in an Organized Health Care Education/Training Program

## 2020-02-20 ENCOUNTER — Other Ambulatory Visit: Payer: Self-pay

## 2020-02-20 VITALS — BP 156/61 | HR 67 | Temp 97.1°F | Ht 63.0 in | Wt 163.0 lb

## 2020-02-20 DIAGNOSIS — M25562 Pain in left knee: Secondary | ICD-10-CM | POA: Diagnosis present

## 2020-02-20 DIAGNOSIS — M47818 Spondylosis without myelopathy or radiculopathy, sacral and sacrococcygeal region: Secondary | ICD-10-CM | POA: Diagnosis not present

## 2020-02-20 DIAGNOSIS — M533 Sacrococcygeal disorders, not elsewhere classified: Secondary | ICD-10-CM | POA: Diagnosis present

## 2020-02-20 DIAGNOSIS — G8929 Other chronic pain: Secondary | ICD-10-CM | POA: Diagnosis present

## 2020-02-20 DIAGNOSIS — G894 Chronic pain syndrome: Secondary | ICD-10-CM | POA: Insufficient documentation

## 2020-02-20 DIAGNOSIS — M153 Secondary multiple arthritis: Secondary | ICD-10-CM | POA: Insufficient documentation

## 2020-02-20 DIAGNOSIS — M5136 Other intervertebral disc degeneration, lumbar region: Secondary | ICD-10-CM | POA: Insufficient documentation

## 2020-02-20 DIAGNOSIS — M545 Low back pain, unspecified: Secondary | ICD-10-CM | POA: Diagnosis present

## 2020-02-20 DIAGNOSIS — M170 Bilateral primary osteoarthritis of knee: Secondary | ICD-10-CM | POA: Diagnosis present

## 2020-02-20 DIAGNOSIS — M1711 Unilateral primary osteoarthritis, right knee: Secondary | ICD-10-CM | POA: Insufficient documentation

## 2020-02-20 DIAGNOSIS — M47816 Spondylosis without myelopathy or radiculopathy, lumbar region: Secondary | ICD-10-CM | POA: Insufficient documentation

## 2020-02-20 DIAGNOSIS — M25561 Pain in right knee: Secondary | ICD-10-CM | POA: Insufficient documentation

## 2020-02-20 MED ORDER — HYDROCODONE-ACETAMINOPHEN 5-325 MG PO TABS: 1.0000 | ORAL_TABLET | Freq: Two times a day (BID) | ORAL | 0 refills | Status: AC

## 2020-02-20 MED ORDER — DICLOFENAC SODIUM 1 % EX GEL: 4.0000 g | Freq: Four times a day (QID) | CUTANEOUS | 2 refills | Status: AC

## 2020-02-20 NOTE — Progress Notes (Signed)
Nursing Pain Medication Assessment:  Safety precautions to be maintained throughout the outpatient stay will include: orient to surroundings, keep bed in low position, maintain call bell within reach at all times, provide assistance with transfer out of bed and ambulation.  Medication Inspection Compliance: Pill count conducted under aseptic conditions, in front of the patient. Neither the pills nor the bottle was removed from the patient's sight at any time. Once count was completed pills were immediately returned to the patient in their original bottle.  Medication: Hydrocodone/APAP Pill/Patch Count: 9 of 60 pills remain Pill/Patch Appearance: Markings consistent with prescribed medication Bottle Appearance: Standard pharmacy container. Clearly labeled. Filled Date: 64 / 30 / 21 Last Medication intake:  TodaySafety precautions to be maintained throughout the outpatient stay will include: orient to surroundings, keep bed in low position, maintain call bell within reach at all times, provide assistance with transfer out of bed and ambulation.

## 2020-02-20 NOTE — Progress Notes (Signed)
PROVIDER NOTE: Information contained herein reflects review and annotations entered in association with encounter. Interpretation of such information and data should be left to medically-trained personnel. Information provided to patient can be located elsewhere in the medical record under "Patient Instructions". Document created using STT-dictation technology, any transcriptional errors that may result from process are unintentional.    Patient: Jocelyn [name]  Service Category: E/M  Provider: Gillis Santa, MD  DOB: [birth year]  DOS: 02/20/2020  Specialty: Interventional Pain Management  MRN: [id number]  Setting: Ambulatory outpatient  PCP: Crissie Figures, PA-C  Type: Established Patient    Referring Provider: Crissie Figures, PA-C  Location: Office  Delivery: Face-to-face     HPI  [name], a 87 y.o. year old female, is here today because of her Lumbar facet arthropathy [M47.816]. [name] primary complain today is Back Pain Last encounter: My last encounter with her was on 11/21/2019. Pertinent problems: [name] has Lumbar degenerative disc disease; Hearing loss; Cerebrovascular accident (CVA) ([place]); Controlled substance agreement signed; Arthritis of both knees; Carpal tunnel syndrome on right; SI joint arthritis; Chronic pain of both knees; and Chronic pain syndrome on their pertinent problem list. Pain Assessment: Severity of Chronic pain is reported as a 5 /10. Location: Back Left,Right,Lower/Denies, pain hands, back, both knee. Onset: More than a month ago. Quality: Aching,Burning,Sharp,Throbbing. Timing: Constant. Modifying factor(s): meds, pain cream. Vitals:  height is 5' 3"  (1.6 m) and weight is 163 lb (73.9 kg). Her temperature is 97.1 F (36.2 C) (abnormal). Her blood pressure is 156/61 (abnormal) and her pulse is 67. Her oxygen saturation is 98%.   Reason for encounter: medication management.    Is having low back and bilateral hand pain. Also worsening knee pain  that is bothersome in the evening. Related to OA. Here for medication refill of Hydrocodone and Voltaren gel.  Pharmacotherapy Assessment   12/28/2019  11/21/2019   1  Hydrocodone-Acetamin 5-325 Mg  60.00  30  Bi Lat  [id number]  Nor (4618)  0/0  10.00 MME  Medicare  [place]      Analgesic: Hydrocodone 5 mg BID prn #60/month   Monitoring: [place] PMP: PDMP reviewed during this encounter.       Pharmacotherapy: No side-effects or adverse reactions reported. Compliance: No problems identified. Effectiveness: Clinically acceptable.  Chauncey Fischer, RN  02/20/2020  9:45 AM  Sign when Signing Visit Nursing Pain Medication Assessment:  Safety precautions to be maintained throughout the outpatient stay will include: orient to surroundings, keep bed in low position, maintain call bell within reach at all times, provide assistance with transfer out of bed and ambulation.  Medication Inspection Compliance: Pill count conducted under aseptic conditions, in front of the patient. Neither the pills nor the bottle was removed from the patient's sight at any time. Once count was completed pills were immediately returned to the patient in their original bottle.  Medication: Hydrocodone/APAP Pill/Patch Count: 9 of 60 pills remain Pill/Patch Appearance: Markings consistent with prescribed medication Bottle Appearance: Standard pharmacy container. Clearly labeled. Filled Date: 64 / 51 / 21 Last Medication intake:  TodaySafety precautions to be maintained throughout the outpatient stay will include: orient to surroundings, keep bed in low position, maintain call bell within reach at all times, provide assistance with transfer out of bed and ambulation.     UDS:  Summary  Date Value Ref Range Status  11/21/2019 Note  Final    Comment:    ==================================================================== [facility] Select 13 (MW) ==================================================================== Test  Result       Flag       Units  Drug Present and Declared for Prescription Verification   Hydrocodone                    871          EXPECTED   ng/mg creat   Hydromorphone                  285          EXPECTED   ng/mg creat   Dihydrocodeine                 113          EXPECTED   ng/mg creat   Norhydrocodone                 1030         EXPECTED   ng/mg creat    Sources of hydrocodone include scheduled prescription medications.    Hydromorphone, dihydrocodeine and norhydrocodone are expected    metabolites of hydrocodone. Hydromorphone and dihydrocodeine are    also available as scheduled prescription medications.  ==================================================================== Test                      Result    Flag   Units      Ref Range   Creatinine              96               mg/dL      >=20 ==================================================================== Declared Medications:  The flagging and interpretation on this report are based on the  following declared medications.  Unexpected results may arise from  inaccuracies in the declared medications.   **Note: The testing scope of this panel includes these medications:   Hydrocodone   **Note: The testing scope of this panel does not include the  following reported medications:   Acetaminophen  Albuterol  Amlodipine  Aspirin  Calcium  Clonidine (Catapres)  Diclofenac (Voltaren)  Famotidine  Hydrochlorothiazide  Iron  Isosorbide (Imdur)  Olmesartan  Omeprazole  Pravastatin  Vitamin D ==================================================================== For clinical consultation, please call [phone]. ====================================================================      ROS  Constitutional: Denies any fever or chills Gastrointestinal: No reported hemesis, hematochezia, vomiting, or acute GI distress Musculoskeletal: hip, low back, and bilateral hand pain Neurological: No  reported episodes of acute onset apraxia, aphasia, dysarthria, agnosia, amnesia, paralysis, loss of coordination, or loss of consciousness  Medication Review  HYDROcodone-acetaminophen, Olmesartan-amLODIPine-HCTZ, albuterol, aspirin EC, atorvastatin, calcium citrate-vitamin D, carvedilol, diclofenac Sodium, ferrous sulfate, isosorbide mononitrate, and omeprazole  History Review  Allergy: [name] has No Known Allergies. Drug: [name]  reports no history of drug use. Alcohol:  reports no history of alcohol use. Tobacco:  reports that she has never smoked. She has never used smokeless tobacco. Social: [name]  reports that she has never smoked. She has never used smokeless tobacco. She reports that she does not drink alcohol and does not use drugs. Medical:  has a past medical history of Arthritis of both knees (08/01/2015), Colitis, Essential hypertension, benign (07/28/2014), GERD (gastroesophageal reflux disease), iron deficiency anemia (09/02/2015), Hyperlipidemia, Hypertension, Lumbar pain, Medicare annual wellness visit, subsequent (04/07/2017), OSA on CPAP (08/01/2015), Overweight (BMI 25.0-29.9) (07/10/2013), Prediabetes (09/02/2015), and SOB (shortness of breath) (07/10/2013). Surgical: [name]  has a past surgical history that includes Cataract extraction; Abdominal hysterectomy; Cholecystectomy; Lumbar disc  surgery; and Colonoscopy with propofol (N/A, 04/09/2018). Family: family history includes Anuerysm in her son; Cancer in her daughter and father; Diabetes in her daughter and mother; Heart disease in her mother; Hypertension in her daughter.  Laboratory Chemistry Profile   Renal Lab Results  Component Value Date   BUN 14 08/01/2018   CREATININE 0.80 16/11/9602   BCR NOT APPLICABLE 54/10/8117   GFRAA 78 08/01/2018   GFRNONAA 67 08/01/2018     Hepatic Lab Results  Component Value Date   AST 19 08/01/2018   ALT 11 08/01/2018   ALBUMIN 4.1 07/07/2016   ALKPHOS 49 07/07/2016      Electrolytes Lab Results  Component Value Date   NA 140 08/01/2018   K 3.9 08/01/2018   CL 103 08/01/2018   CALCIUM 10.0 08/01/2018     Bone No results found for: VD25OH, VD125OH2TOT, JY7829FA2, ZH0865HQ4, 25OHVITD1, 25OHVITD2, 25OHVITD3, TESTOFREE, TESTOSTERONE   Inflammation (CRP: Acute Phase) (ESR: Chronic Phase) No results found for: CRP, ESRSEDRATE, LATICACIDVEN     Note: Above Lab results reviewed.  Recent Imaging Review  NM Myocar Multi W/Spect W/Wall Motion / EF Pharmacological myocardial perfusion imaging study with no significant   ischemia Normal wall motion, EF estimated at 43% (likely depressed secondary to GI  uptake artifact) No EKG changes concerning for ischemia at peak stress or in recovery. CT attenuation correction images with mild to moderate aortic  atherosclerosis, significant mitral valve annular calcification, mild  coronary calcification Low risk scan  Signed, Esmond Plants, MD, Ph.D [facility] Note: Reviewed        Physical Exam  General appearance: Well nourished, well developed, and well hydrated. In no apparent acute distress Mental status: Alert, oriented x 3 (person, place, & time)       Respiratory: No evidence of acute respiratory distress Eyes: PERLA Vitals: BP (!) 156/61   Pulse 67   Temp (!) 97.1 F (36.2 C)   Ht 5' 3"  (1.6 m)   Wt 163 lb (73.9 kg)   SpO2 98%   BMI 28.87 kg/m  BMI: Estimated body mass index is 28.87 kg/m as calculated from the following:   Height as of this encounter: 5' 3"  (1.6 m).   Weight as of this encounter: 163 lb (73.9 kg). Ideal: Ideal body weight: 52.4 kg (115 lb 8.3 oz) Adjusted ideal body weight: 61 kg (134 lb 8.2 oz)   Positive low back pain, worse with facet loading. Bilateral knee pain. Patient comes in in wheelchair  Assessment   Status Diagnosis  Controlled Controlled Controlled 1. Lumbar facet arthropathy   2. Lumbar degenerative disc disease   3. SI joint arthritis   4.  Chronic pain syndrome   5. Chronic pain of both knees   6. Chronic SI joint pain   7. Primary osteoarthritis of right knee   8. Bilateral primary osteoarthritis of knee   9. Chronic bilateral low back pain without sciatica   10. Secondary osteoarthritis of multiple sites        Plan of Care  [name] has a current medication list which includes the following long-term medication(s): atorvastatin, calcium citrate-vitamin d, carvedilol, isosorbide mononitrate, olmesartan-amlodipine-hctz, and ferrous sulfate.  Pharmacotherapy (Medications Ordered): Meds ordered this encounter  Medications  . HYDROcodone-acetaminophen (NORCO/VICODIN) 5-325 MG tablet    Sig: Take 1 tablet by mouth every 12 (twelve) hours.    Dispense:  60 tablet    Refill:  0    For chronic pain syndrome  . HYDROcodone-acetaminophen (  NORCO/VICODIN) 5-325 MG tablet    Sig: Take 1 tablet by mouth every 12 (twelve) hours.    Dispense:  60 tablet    Refill:  0    For chronic pain syndrome  . diclofenac Sodium (VOLTAREN) 1 % GEL    Sig: Apply 4 g topically 4 (four) times daily.    Dispense:  350 g    Refill:  2    Do not add this medication to the electronic "Automatic Refill" notification system. Patient may have prescription filled one day early if pharmacy is closed on scheduled refill date.    Follow-up plan:   Return in about 10 weeks (around 04/30/2020) for Medication Management, in person.   Recent Visits No visits were found meeting these conditions. Showing recent visits within past 90 days and meeting all other requirements Today's Visits Date Type Provider Dept  02/20/20 Office Visit Gillis Santa, MD [facility]  Showing today's visits and meeting all other requirements Future Appointments Date Type Provider Dept  04/30/20 Appointment Gillis Santa, MD [facility]  Showing future appointments within next 90 days and meeting all other requirements  I discussed the  assessment and treatment plan with the patient. The patient was provided an opportunity to ask questions and all were answered. The patient agreed with the plan and demonstrated an understanding of the instructions.  Patient advised to call back or seek an in-person evaluation if the symptoms or condition worsens.  Duration of encounter: 30 minutes.  Note by: Gillis Santa, MD Date: 02/20/2020; Time: 10:10 AM

## 2020-02-26 NOTE — Progress Notes (Signed)
Cardiology Office Note    Date:  03/02/2020   ID:  Jocelyn [name], Jocelyn [name] 08-23-1931, MRN [id number]  PCP:  Crissie Figures, PA-C  Cardiologist:  Nelva Bush, MD  Electrophysiologist:  None   Chief Complaint: Follow-up  History of Present Illness:   Jocelyn [name] is a 87 y.o. female with history of coronary artery calcification, aortic atherosclerosis, stroke, prediabetes, HTN, HLD, OSA on CPAP, and GERD who presents for follow-up of HLD.  Remote pharmacologic MPI in 07/2010 showed a small, fixed apical defect with an LVEF of 44% with global hypokinesis with more severe hypokinesis at the apex.  She was evaluated by Dr. Saunders Revel in 10/2019 and reported relatively sudden onset of shortness of breath that began in 09/2019. Echo in 11/2019 showed an EF of 60 to 123456, grade 1 diastolic dysfunction, normal RV systolic function and ventricular cavity size, mild left atrial enlargement, and mild mitral regurgitation with moderate mitral annular calcification. When she was seen in follow-up in late 11/2019 she reported she was feeling better with less shortness of breath though did still have some dyspnea on exertion. In this setting, she underwent Lexiscan MPI on 12/13/2019 that showed no significant ischemia with normal wall motion and an EF estimated at 40% felt to be depressed secondary to GI uptake artifact. CT attenuation corrected images showed mild to moderate aortic atherosclerosis with significant mitral valve annular calcification and mild coronary artery calcification. Overall, this was a low risk scan.  She was last seen in the office on 12/30/2019 and was doing well from a cardiac perspective.  She noted resolution of her exertional dyspnea outside of one episode when she was cleaning her house.  She tolerated discontinuation of clonidine and initiation of carvedilol without issues.  Pravastatin was discontinued and she was initiated on atorvastatin with recommendation to follow-up  today.  She comes in doing well today from a cardiac perspective.  She continues to note stable mild shortness of breath which is nonlifestyle limiting.  No chest pain, palpitations, dizziness, presyncope, syncope, falls, lower extremity swelling, abdominal distention, orthopnea, PND, early satiety.  She has tolerated the transition from pravastatin to atorvastatin without issues.  She does not have any issues or concerns today.   Labs independently reviewed: 02/2019 - Hgb 12.2, PLT 186, BUN 23, serum creatinine 0.84, potassium 4.1, albumin 4.4, AST/ALT normal, TC 205, TG 68, HDL 63, LDL 130, A1c 5.8 01/2018 - TSH normal  Past Medical History:  Diagnosis Date  . Arthritis of both knees 08/01/2015  . Colitis   . Essential hypertension, benign 07/28/2014  . GERD (gastroesophageal reflux disease)   . Hx of iron deficiency anemia 09/02/2015  . Hyperlipidemia   . Hypertension   . Lumbar pain   . Medicare annual wellness visit, subsequent 04/07/2017  . OSA on CPAP 08/01/2015  . Overweight (BMI 25.0-29.9) 07/10/2013  . Prediabetes 09/02/2015  . SOB (shortness of breath) 07/10/2013    Past Surgical History:  Procedure Laterality Date  . ABDOMINAL HYSTERECTOMY    . CATARACT EXTRACTION    . CHOLECYSTECTOMY    . COLONOSCOPY WITH PROPOFOL N/A 04/09/2018   Procedure: COLONOSCOPY WITH PROPOFOL;  Surgeon: Lin Landsman, MD;  Location: [facility] ENDOSCOPY;  Service: Gastroenterology;  Laterality: N/A;  . LUMBAR DISC SURGERY      Current Medications: Current Meds  Medication Sig  . albuterol (VENTOLIN HFA) 108 (90 Base) MCG/ACT inhaler Inhale 1 puff into the lungs as needed.  Marland Kitchen aspirin EC 81 MG tablet Take  1 tablet (81 mg total) by mouth daily.  Marland Kitchen atorvastatin (LIPITOR) 40 MG tablet Take 1 tablet (40 mg total) by mouth daily.  . calcium citrate-vitamin D 500-400 MG-UNIT chewable tablet Chew 1 tablet by mouth 2 (two) times daily.   . carvedilol (COREG) 3.125 MG tablet TAKE 1 TABLET BY MOUTH 2 TIMES DAILY.   Marland Kitchen diclofenac Sodium (VOLTAREN) 1 % GEL Apply 4 g topically 4 (four) times daily.  Marland Kitchen HYDROcodone-acetaminophen (NORCO/VICODIN) 5-325 MG tablet Take 1 tablet by mouth every 12 (twelve) hours.  Derrill Memo ON 03/21/2020] HYDROcodone-acetaminophen (NORCO/VICODIN) 5-325 MG tablet Take 1 tablet by mouth every 12 (twelve) hours.  . isosorbide mononitrate (IMDUR) 120 MG 24 hr tablet TAKE 1 TABLET BY MOUTH EVERY DAY  . Olmesartan-amLODIPine-HCTZ 40-10-12.5 MG TABS Take 1 tablet by mouth daily. For blood pressure  . omeprazole (PRILOSEC) 20 MG capsule Take 20 mg by mouth daily.    Allergies:   Patient has no known allergies.   Social History   Socioeconomic History  . Marital status: Widowed    Spouse name: Not on file  . Number of children: 5  . Years of education: Not on file  . Highest education level: Not on file  Occupational History  . Not on file  Tobacco Use  . Smoking status: Never Smoker  . Smokeless tobacco: Never Used  Vaping Use  . Vaping Use: Never used  Substance and Sexual Activity  . Alcohol use: No    Alcohol/week: 0.0 standard drinks  . Drug use: No  . Sexual activity: Not Currently  Other Topics Concern  . Not on file  Social History Narrative  . Not on file   Social Determinants of Health   Financial Resource Strain: Not on file  Food Insecurity: Not on file  Transportation Needs: Not on file  Physical Activity: Not on file  Stress: Not on file  Social Connections: Not on file     Family History:  The patient's family history includes Anuerysm in her son; Cancer in her daughter and father; Diabetes in her daughter and mother; Heart disease in her mother; Hypertension in her daughter.  ROS:   Review of Systems  Constitutional: Positive for malaise/fatigue. Negative for chills, diaphoresis, fever and weight loss.  HENT: Negative for congestion.   Eyes: Negative for discharge and redness.  Respiratory: Positive for shortness of breath. Negative for cough,  sputum production and wheezing.        Stable shortness of breath  Cardiovascular: Negative for chest pain, palpitations, orthopnea, claudication, leg swelling and PND.  Gastrointestinal: Negative for abdominal pain, heartburn, nausea and vomiting.  Musculoskeletal: Negative for falls and myalgias.  Skin: Negative for rash.  Neurological: Negative for dizziness, tingling, tremors, sensory change, speech change, focal weakness, loss of consciousness and weakness.  Endo/Heme/Allergies: Does not bruise/bleed easily.  Psychiatric/Behavioral: Negative for substance abuse. The patient is not nervous/anxious.   All other systems reviewed and are negative.    EKGs/Labs/Other Studies Reviewed:    Studies reviewed were summarized above. The additional studies were reviewed today:  2D echo 11/01/2019: 1. Left ventricular ejection fraction, by estimation, is 60 to 65%. The  left ventricle has normal function. The left ventricle has no regional  wall motion abnormalities. There is severe left ventricular hypertrophy.  Left ventricular diastolic parameters  are consistent with Grade I diastolic dysfunction (impaired relaxation).  2. Right ventricular systolic function is normal. The right ventricular  size is normal. Tricuspid regurgitation signal is inadequate for assessing  PA pressure.  3. Left atrial size was moderately dilated.  4. Mild mitral valve regurgitation. Moderate mitral annular calcification. __________  Carlton Adam MPI 12/13/2019: Pharmacological myocardial perfusion imaging study with no significant ischemia Normal wall motion, EF estimated at 43% (likely depressed secondary to GI uptake artifact) No EKG changes concerning for ischemia at peak stress or in recovery. CT attenuation correction images with mild to moderate aortic atherosclerosis, significant mitral valve annular calcification, mild coronary calcification Low risk scan   EKG:  EKG is not ordered today.     Recent Labs: No results found for requested labs within last 8760 hours.  Recent Lipid Panel    Component Value Date/Time   CHOL 203 (H) 08/01/2018 1544   CHOL 166 07/08/2015 1136   TRIG 84 08/01/2018 1544   HDL 65 08/01/2018 1544   HDL 60 07/08/2015 1136   CHOLHDL 3.1 08/01/2018 1544   VLDL 18 07/07/2016 1144   LDLCALC 120 (H) 08/01/2018 1544    PHYSICAL EXAM:    VS:  BP (!) 146/62   Pulse 69   Ht 5\' 3"  (1.6 m)   Wt 164 lb (74.4 kg)   BMI 29.05 kg/m   BMI: Body mass index is 29.05 kg/m.  Physical Exam Vitals reviewed.  Constitutional:      Appearance: She is well-developed and well-nourished.  HENT:     Head: Normocephalic and atraumatic.  Eyes:     General:        Right eye: No discharge.        Left eye: No discharge.  Neck:     Vascular: No JVD.  Cardiovascular:     Rate and Rhythm: Normal rate and regular rhythm.     Pulses: No midsystolic click and no opening snap.          Posterior tibial pulses are 2+ on the right side and 2+ on the left side.     Heart sounds: S1 normal and S2 normal. Heart sounds not distant. Murmur heard.   Systolic murmur is present with a grade of 2/6. No friction rub.  Pulmonary:     Effort: Pulmonary effort is normal. No respiratory distress.     Breath sounds: Normal breath sounds. No decreased breath sounds, wheezing or rales.  Chest:     Chest wall: No tenderness.  Abdominal:     General: There is no distension.     Palpations: Abdomen is soft.     Tenderness: There is no abdominal tenderness.  Musculoskeletal:        General: No edema.     Cervical back: Normal range of motion.  Skin:    General: Skin is warm and dry.     Nails: There is no clubbing or cyanosis.  Neurological:     Mental Status: She is alert and oriented to person, place, and time.  Psychiatric:        Mood and Affect: Mood and affect normal.        Speech: Speech normal.        Behavior: Behavior normal.        Thought Content: Thought content  normal.        Judgment: Judgment normal.     Wt Readings from Last 3 Encounters:  03/02/20 164 lb (74.4 kg)  02/20/20 163 lb (73.9 kg)  01/15/20 163 lb 6.4 oz (74.1 kg)     ASSESSMENT & PLAN:   1. Exertional dyspnea/stable angina: She is doing well with stable symptoms without symptoms  of angina.  Cardiac work-up has been reassuring including echo and Lexiscan MPI.  Continue current medical therapy going aspirin, carvedilol, and Imdur.  No indication for further ischemic testing at this time.  2. HTN: Blood pressure mildly elevated at triage though improved to 130/70 on recheck.  Continue current medications including carvedilol, isosorbide mononitrate, and olmesartan/amlodipine/HCTZ.  Low-sodium diet recommended.  3. HLD: LDL 130 from 01/2018 with normal LFT at that time.  She was started on atorvastatin 40 mg in late 12/2019 given goal LDL less than 70 in the setting of coronary artery calcification and aortic atherosclerosis noted on recent noninvasive imaging.  Check LFT, lipid panel, and direct LDL.  Disposition: F/u with Dr. Saunders Revel or an APP in 6 months.   Medication Adjustments/Labs and Tests Ordered: Current medicines are reviewed at length with the patient today.  Concerns regarding medicines are outlined above. Medication changes, Labs and Tests ordered today are summarized above and listed in the Patient Instructions accessible in Encounters.   Signed, Christell Faith, PA-C 03/02/2020 10:49 AM     [place] [address] [place] [address] [place], [place] [address] [phone]

## 2020-03-02 ENCOUNTER — Encounter: Payer: Self-pay | Admitting: Physician Assistant

## 2020-03-02 ENCOUNTER — Other Ambulatory Visit: Payer: Self-pay

## 2020-03-02 ENCOUNTER — Ambulatory Visit (INDEPENDENT_AMBULATORY_CARE_PROVIDER_SITE_OTHER): Payer: Medicare Other | Admitting: Physician Assistant

## 2020-03-02 VITALS — BP 146/62 | HR 69 | Ht 63.0 in | Wt 164.0 lb

## 2020-03-02 DIAGNOSIS — I10 Essential (primary) hypertension: Secondary | ICD-10-CM

## 2020-03-02 DIAGNOSIS — E785 Hyperlipidemia, unspecified: Secondary | ICD-10-CM

## 2020-03-02 DIAGNOSIS — R0600 Dyspnea, unspecified: Secondary | ICD-10-CM

## 2020-03-02 DIAGNOSIS — R0609 Other forms of dyspnea: Secondary | ICD-10-CM

## 2020-03-02 NOTE — Patient Instructions (Signed)
Medication Instructions:  No changes  *If you need a refill on your cardiac medications before your next appointment, please call your pharmacy*   Lab Work: Lipid, Liver, and Direct LDL done today  If you have labs (blood work) drawn today and your tests are completely normal, you will receive your results only by: Marland Kitchen MyChart Message (if you have MyChart) OR . A paper copy in the mail If you have any lab test that is abnormal or we need to change your treatment, we will call you to review the results.   Testing/Procedures: None   Follow-Up: At [facility], you and your health needs are our priority.  As part of our continuing mission to provide you with exceptional heart care, we have created designated Provider Care Teams.  These Care Teams include your primary Cardiologist (physician) and Advanced Practice Providers (APPs -  Physician Assistants and Nurse Practitioners) who all work together to provide you with the care you need, when you need it.  We recommend signing up for the patient portal called "MyChart".  Sign up information is provided on this After Visit Summary.  MyChart is used to connect with patients for Virtual Visits (Telemedicine).  Patients are able to view lab/test results, encounter notes, upcoming appointments, etc.  Non-urgent messages can be sent to your provider as well.   To learn more about what you can do with MyChart, go to [email or web address].    Your next appointment:   6 month(s)  The format for your next appointment:   In Person  Provider:   Nelva Bush, MD or Christell Faith, PA-C

## 2020-03-03 LAB — LIPID PANEL
Chol/HDL Ratio: 2.3 ratio (ref 0.0–4.4)
Cholesterol, Total: 128 mg/dL (ref 100–199)
HDL: 55 mg/dL (ref >39)
LDL Chol Calc (NIH): 60 mg/dL (ref 0–99)
Triglycerides: 60 mg/dL (ref 0–149)
VLDL Cholesterol Cal: 13 mg/dL (ref 5–40)

## 2020-03-03 LAB — HEPATIC FUNCTION PANEL
ALT: 18 IU/L (ref 0–32)
AST: 20 IU/L (ref 0–40)
Albumin: 4.1 g/dL (ref 3.6–4.6)
Alkaline Phosphatase: 63 IU/L (ref 44–121)
Bilirubin Total: 0.5 mg/dL (ref 0.0–1.2)
Bilirubin, Direct: 0.15 mg/dL (ref 0.00–0.40)
Total Protein: 7 g/dL (ref 6.0–8.5)

## 2020-03-03 LAB — LDL CHOLESTEROL, DIRECT: LDL Direct: 65 mg/dL (ref 0–99)

## 2020-04-08 ENCOUNTER — Other Ambulatory Visit: Payer: Self-pay | Admitting: Physician Assistant

## 2020-04-08 DIAGNOSIS — Z13820 Encounter for screening for osteoporosis: Secondary | ICD-10-CM

## 2020-04-30 ENCOUNTER — Encounter: Payer: Self-pay | Admitting: Student in an Organized Health Care Education/Training Program

## 2020-04-30 ENCOUNTER — Other Ambulatory Visit: Payer: Self-pay

## 2020-04-30 ENCOUNTER — Ambulatory Visit
Payer: Medicare Other | Attending: Student in an Organized Health Care Education/Training Program | Admitting: Student in an Organized Health Care Education/Training Program

## 2020-04-30 VITALS — BP 156/56 | HR 71 | Temp 98.2°F | Resp 18 | Ht 63.0 in | Wt 164.0 lb

## 2020-04-30 DIAGNOSIS — M25562 Pain in left knee: Secondary | ICD-10-CM | POA: Insufficient documentation

## 2020-04-30 DIAGNOSIS — M47816 Spondylosis without myelopathy or radiculopathy, lumbar region: Secondary | ICD-10-CM | POA: Diagnosis not present

## 2020-04-30 DIAGNOSIS — M5136 Other intervertebral disc degeneration, lumbar region: Secondary | ICD-10-CM | POA: Insufficient documentation

## 2020-04-30 DIAGNOSIS — M47818 Spondylosis without myelopathy or radiculopathy, sacral and sacrococcygeal region: Secondary | ICD-10-CM | POA: Diagnosis not present

## 2020-04-30 DIAGNOSIS — G8929 Other chronic pain: Secondary | ICD-10-CM | POA: Diagnosis present

## 2020-04-30 DIAGNOSIS — G894 Chronic pain syndrome: Secondary | ICD-10-CM | POA: Insufficient documentation

## 2020-04-30 DIAGNOSIS — M25561 Pain in right knee: Secondary | ICD-10-CM | POA: Diagnosis present

## 2020-04-30 MED ORDER — HYDROCODONE-ACETAMINOPHEN 5-325 MG PO TABS: 1.0000 | ORAL_TABLET | Freq: Two times a day (BID) | ORAL | 0 refills | Status: AC | PRN

## 2020-04-30 NOTE — Progress Notes (Signed)
Safety precautions to be maintained throughout the outpatient stay will include: orient to surroundings, keep bed in low position, maintain call bell within reach at all times, provide assistance with transfer out of bed and ambulation.    Nursing Pain Medication Assessment:  Safety precautions to be maintained throughout the outpatient stay will include: orient to surroundings, keep bed in low position, maintain call bell within reach at all times, provide assistance with transfer out of bed and ambulation.  Medication Inspection Compliance: Jocelyn [name] did not comply with our request to bring her pills to be counted. She was reminded that bringing the medication bottles, even when empty, is a requirement.  Medication: None brought in. Pill/Patch Count: None available to be counted. Bottle Appearance: No container available. Did not bring bottle(s) to appointment. Filled Date: N/A Last Medication intake:  Today

## 2020-04-30 NOTE — Progress Notes (Signed)
PROVIDER NOTE: Information contained herein reflects review and annotations entered in association with encounter. Interpretation of such information and data should be left to medically-trained personnel. Information provided to patient can be located elsewhere in the medical record under "Patient Instructions". Document created using STT-dictation technology, any transcriptional errors that may result from process are unintentional.    Patient: Jocelyn [name]  Service Category: E/M  Provider: Gillis Santa, MD  DOB: [birth year]  DOS: 04/30/2020  Specialty: Interventional Pain Management  MRN: [id number]  Setting: Ambulatory outpatient  PCP: Crissie Figures, PA-C  Type: Established Patient    Referring Provider: Crissie Figures, PA-C  Location: Office  Delivery: Face-to-face     HPI  [name], a 87 y.o. year old female, is here today because of her Lumbar facet arthropathy [M47.816]. [name] primary complain today is Back Pain and Knee Pain Last encounter: My last encounter with her was on 02/20/2020. Pertinent problems: [name] has Lumbar degenerative disc disease; Hearing loss; Cerebrovascular accident (CVA) ([place]); Controlled substance agreement signed; Arthritis of both knees; Carpal tunnel syndrome on right; SI joint arthritis; Chronic pain of both knees; and Chronic pain syndrome on their pertinent problem list. Pain Assessment: Severity of Acute pain is reported as a 1  (7 during the day)/10. Location: Knee Right,Left/Denies at the moment but at worse days pain radiates down to ankle.. Onset: More than a month ago. Quality: Throbbing. Timing: Intermittent. Modifying factor(s): Pain medication and resting. Vitals:  height is 5' 3" (1.6 m) and weight is 164 lb (74.4 kg). Her temperature is 98.2 F (36.8 C). Her blood pressure is 156/56 (abnormal) and her pulse is 71. Her respiration is 18 and oxygen saturation is 97%.   Reason for encounter: medication management.    No change  in medical history since last visit.  Patient's pain is at baseline.  Patient continues multimodal pain regimen as prescribed.  States that it provides pain relief and improvement in functional status. Increased knee pain at night, described as throbbing.  Recommend she utilize Voltaren gel as well as a heating pad.  Pharmacotherapy Assessment   Analgesic: Hydrocodone 5 mg BID prn #60/month   Monitoring: [place] PMP: PDMP reviewed during this encounter.       Pharmacotherapy: No side-effects or adverse reactions reported. Compliance: No problems identified. Effectiveness: Clinically acceptable.  Janne Napoleon, RN  04/30/2020  8:44 AM  Sign when Signing Visit Safety precautions to be maintained throughout the outpatient stay will include: orient to surroundings, keep bed in low position, maintain call bell within reach at all times, provide assistance with transfer out of bed and ambulation.    Nursing Pain Medication Assessment:  Safety precautions to be maintained throughout the outpatient stay will include: orient to surroundings, keep bed in low position, maintain call bell within reach at all times, provide assistance with transfer out of bed and ambulation.  Medication Inspection Compliance: Ms. Wieting did not comply with our request to bring her pills to be counted. She was reminded that bringing the medication bottles, even when empty, is a requirement.  Medication: None brought in. Pill/Patch Count: None available to be counted. Bottle Appearance: No container available. Did not bring bottle(s) to appointment. Filled Date: N/A Last Medication intake:  Today           UDS:  Summary  Date Value Ref Range Status  11/21/2019 Note  Final    Comment:    ==================================================================== [facility] (MW) ==================================================================== Test  Result       Flag        Units  Drug Present and Declared for Prescription Verification   Hydrocodone                    871          EXPECTED   ng/mg creat   Hydromorphone                  285          EXPECTED   ng/mg creat   Dihydrocodeine                 113          EXPECTED   ng/mg creat   Norhydrocodone                 1030         EXPECTED   ng/mg creat    Sources of hydrocodone include scheduled prescription medications.    Hydromorphone, dihydrocodeine and norhydrocodone are expected    metabolites of hydrocodone. Hydromorphone and dihydrocodeine are    also available as scheduled prescription medications.  ==================================================================== Test                      Result    Flag   Units      Ref Range   Creatinine              96               mg/dL      >=20 ==================================================================== Declared Medications:  The flagging and interpretation on this report are based on the  following declared medications.  Unexpected results may arise from  inaccuracies in the declared medications.   **Note: The testing scope of this panel includes these medications:   Hydrocodone   **Note: The testing scope of this panel does not include the  following reported medications:   Acetaminophen  Albuterol  Amlodipine  Aspirin  Calcium  Clonidine (Catapres)  Diclofenac (Voltaren)  Famotidine  Hydrochlorothiazide  Iron  Isosorbide (Imdur)  Olmesartan  Omeprazole  Pravastatin  Vitamin D ==================================================================== For clinical consultation, please call [phone]. ====================================================================      ROS  Constitutional: Denies any fever or chills Gastrointestinal: No reported hemesis, hematochezia, vomiting, or acute GI distress Musculoskeletal: Low back, bilateral knee pain Neurological: No reported episodes of acute onset apraxia, aphasia,  dysarthria, agnosia, amnesia, paralysis, loss of coordination, or loss of consciousness  Medication Review  HYDROcodone-acetaminophen, Olmesartan-amLODIPine-HCTZ, albuterol, aspirin EC, atorvastatin, calcium citrate-vitamin D, carvedilol, diclofenac Sodium, ferrous sulfate, isosorbide mononitrate, omeprazole, and pravastatin  History Review  Allergy: Ms. Granade has No Known Allergies. Drug: Ms. Record  reports no history of drug use. Alcohol:  reports no history of alcohol use. Tobacco:  reports that she has never smoked. She has never used smokeless tobacco. Social: Ms. Musselman  reports that she has never smoked. She has never used smokeless tobacco. She reports that she does not drink alcohol and does not use drugs. Medical:  has a past medical history of Arthritis of both knees (08/01/2015), Colitis, Essential hypertension, benign (07/28/2014), GERD (gastroesophageal reflux disease), iron deficiency anemia (09/02/2015), Hyperlipidemia, Hypertension, Lumbar pain, Medicare annual wellness visit, subsequent (04/07/2017), OSA on CPAP (08/01/2015), Overweight (BMI 25.0-29.9) (07/10/2013), Prediabetes (09/02/2015), and SOB (shortness of breath) (07/10/2013). Surgical: Ms. Degnan  has a past surgical history that includes Cataract extraction; Abdominal hysterectomy; Cholecystectomy; Lumbar disc   surgery; and Colonoscopy with propofol (N/A, 04/09/2018). Family: family history includes Anuerysm in her son; Cancer in her daughter and father; Diabetes in her daughter and mother; Heart disease in her mother; Hypertension in her daughter.  Laboratory Chemistry Profile   Renal Lab Results  Component Value Date   BUN 14 08/01/2018   CREATININE 0.80 08/01/2018   BCR NOT APPLICABLE 08/01/2018   GFRAA 78 08/01/2018   GFRNONAA 67 08/01/2018     Hepatic Lab Results  Component Value Date   AST 20 03/02/2020   ALT 18 03/02/2020   ALBUMIN 4.1 03/02/2020   ALKPHOS 63 03/02/2020     Electrolytes Lab Results  Component  Value Date   NA 140 08/01/2018   K 3.9 08/01/2018   CL 103 08/01/2018   CALCIUM 10.0 08/01/2018     Bone No results found for: VD25OH, VD125OH2TOT, VD3125OH2, VD2125OH2, 25OHVITD1, 25OHVITD2, 25OHVITD3, TESTOFREE, TESTOSTERONE   Inflammation (CRP: Acute Phase) (ESR: Chronic Phase) No results found for: CRP, ESRSEDRATE, LATICACIDVEN     Note: Above Lab results reviewed.  Recent Imaging Review  NM Myocar Multi W/Spect W/Wall Motion / EF Pharmacological myocardial perfusion imaging study with no significant   ischemia Normal wall motion, EF estimated at 43% (likely depressed secondary to GI  uptake artifact) No EKG changes concerning for ischemia at peak stress or in recovery. CT attenuation correction images with mild to moderate aortic  atherosclerosis, significant mitral valve annular calcification, mild  coronary calcification Low risk scan  Signed, Tim Gollan, MD, Ph.D [facility] Note: Reviewed        Physical Exam  General appearance: Well nourished, well developed, and well hydrated. In no apparent acute distress Mental status: Alert, oriented x 3 (person, place, & time)       Respiratory: No evidence of acute respiratory distress Eyes: PERLA Vitals: BP (!) 156/56   Pulse 71   Temp 98.2 F (36.8 C)   Resp 18   Ht 5' 3" (1.6 m)   Wt 164 lb (74.4 kg)   SpO2 97%   BMI 29.05 kg/m  BMI: Estimated body mass index is 29.05 kg/m as calculated from the following:   Height as of this encounter: 5' 3" (1.6 m).   Weight as of this encounter: 164 lb (74.4 kg). Ideal: Ideal body weight: 52.4 kg (115 lb 8.3 oz) Adjusted ideal body weight: 61.2 kg (134 lb 14.6 oz)  Patient presents today in wheelchair.  Low back pain, bilateral knee pain.  Assessment   Status Diagnosis  Controlled Controlled Controlled 1. Lumbar facet arthropathy   2. Lumbar degenerative disc disease   3. SI joint arthritis   4. Chronic pain of both knees   5. Chronic pain syndrome      Plan  of Care  Ms. Sabine W Bentsen has a current medication list which includes the following long-term medication(s): atorvastatin, calcium citrate-vitamin d, carvedilol, ferrous sulfate, isosorbide mononitrate, and olmesartan-amlodipine-hctz.  Pharmacotherapy (Medications Ordered): Meds ordered this encounter  Medications  . HYDROcodone-acetaminophen (NORCO/VICODIN) 5-325 MG tablet    Sig: Take 1 tablet by mouth 2 (two) times daily as needed for severe pain. Must last 30 days.    Dispense:  60 tablet    Refill:  0    Chronic Pain. (STOP Act - Not applicable). Fill one day early if closed on scheduled refill date.    Follow-up plan:   Return if symptoms worsen or fail to improve.   Recent Visits Date Type Provider Dept  02/20/20 Office   Visit Lateef, Bilal, MD [facility]  Showing recent visits within past 90 days and meeting all other requirements Today's Visits Date Type Provider Dept  04/30/20 Office Visit Lateef, Bilal, MD [facility]  Showing today's visits and meeting all other requirements Future Appointments Date Type Provider Dept  07/23/20 Appointment Lateef, Bilal, MD [facility]  Showing future appointments within next 90 days and meeting all other requirements  I discussed the assessment and treatment plan with the patient. The patient was provided an opportunity to ask questions and all were answered. The patient agreed with the plan and demonstrated an understanding of the instructions.  Patient advised to call back or seek an in-person evaluation if the symptoms or condition worsens.  Duration of encounter: 30 minutes.  Note by: Bilal Lateef, MD Date: 04/30/2020; Time: 8:48 AM 

## 2020-05-12 ENCOUNTER — Other Ambulatory Visit: Payer: Self-pay

## 2020-05-12 ENCOUNTER — Ambulatory Visit
Admission: RE | Admit: 2020-05-12 | Discharge: 2020-05-12 | Disposition: A | Discharge status: Home / Self Care | Payer: Medicare Other | Source: Ambulatory Visit | Attending: Physician Assistant | Admitting: Physician Assistant

## 2020-05-12 DIAGNOSIS — M810 Age-related osteoporosis without current pathological fracture: Secondary | ICD-10-CM | POA: Insufficient documentation

## 2020-05-12 DIAGNOSIS — Z13820 Encounter for screening for osteoporosis: Secondary | ICD-10-CM | POA: Diagnosis not present

## 2020-05-12 DIAGNOSIS — Z780 Asymptomatic menopausal state: Secondary | ICD-10-CM | POA: Insufficient documentation

## 2020-05-17 ENCOUNTER — Other Ambulatory Visit: Payer: Self-pay | Admitting: Internal Medicine

## 2020-06-09 ENCOUNTER — Telehealth: Payer: Self-pay | Admitting: *Deleted

## 2020-06-09 NOTE — Telephone Encounter (Signed)
Error message,do not contact.

## 2020-06-09 NOTE — Telephone Encounter (Signed)
Patient would like to schedule an appointment, having bruised up leg and possible right ankle split tendons. Please schedule.

## 2020-06-25 ENCOUNTER — Other Ambulatory Visit: Payer: Self-pay | Admitting: Physician Assistant

## 2020-07-23 ENCOUNTER — Ambulatory Visit
Payer: Medicare Other | Attending: Student in an Organized Health Care Education/Training Program | Admitting: Student in an Organized Health Care Education/Training Program

## 2020-07-23 ENCOUNTER — Other Ambulatory Visit: Payer: Self-pay

## 2020-07-23 ENCOUNTER — Encounter: Payer: Self-pay | Admitting: Student in an Organized Health Care Education/Training Program

## 2020-07-23 VITALS — BP 137/64 | HR 66 | Temp 97.2°F | Resp 16 | Ht 64.0 in | Wt 164.0 lb

## 2020-07-23 DIAGNOSIS — M5136 Other intervertebral disc degeneration, lumbar region: Secondary | ICD-10-CM | POA: Diagnosis not present

## 2020-07-23 DIAGNOSIS — G894 Chronic pain syndrome: Secondary | ICD-10-CM | POA: Diagnosis not present

## 2020-07-23 DIAGNOSIS — M47816 Spondylosis without myelopathy or radiculopathy, lumbar region: Secondary | ICD-10-CM

## 2020-07-23 DIAGNOSIS — M47818 Spondylosis without myelopathy or radiculopathy, sacral and sacrococcygeal region: Secondary | ICD-10-CM | POA: Diagnosis not present

## 2020-07-23 DIAGNOSIS — M51369 Other intervertebral disc degeneration, lumbar region without mention of lumbar back pain or lower extremity pain: Secondary | ICD-10-CM

## 2020-07-23 DIAGNOSIS — M461 Sacroiliitis, not elsewhere classified: Secondary | ICD-10-CM

## 2020-07-23 MED ORDER — HYDROCODONE-ACETAMINOPHEN 5-325 MG PO TABS: 1.0000 | ORAL_TABLET | Freq: Two times a day (BID) | ORAL | 0 refills | Status: AC | PRN

## 2020-07-23 NOTE — Progress Notes (Signed)
Nursing Pain Medication Assessment:  Safety precautions to be maintained throughout the outpatient stay will include: orient to surroundings, keep bed in low position, maintain call bell within reach at all times, provide assistance with transfer out of bed and ambulation.  Medication Inspection Compliance: Pill count conducted under aseptic conditions, in front of the patient. Neither the pills nor the bottle was removed from the patient's sight at any time. Once count was completed pills were immediately returned to the patient in their original bottle.  Medication: Hydrocodone/APAP Pill/Patch Count:  5 of 60 pills remain Pill/Patch Appearance: Markings consistent with prescribed medication Bottle Appearance: Standard pharmacy container. Clearly labeled. Filled Date: 5 / 10 / 2022 Last Medication intake:  Today

## 2020-07-23 NOTE — Progress Notes (Signed)
PROVIDER NOTE: Information contained herein reflects review and annotations entered in association with encounter. Interpretation of such information and data should be left to medically-trained personnel. Information provided to patient can be located elsewhere in the medical record under "Patient Instructions". Document created using STT-dictation technology, any transcriptional errors that may result from process are unintentional.    Patient: Jocelyn [name]  Service Category: E/M  Provider: Gillis Santa, MD  DOB: [birth year]  DOS: 07/23/2020  Specialty: Interventional Pain Management  MRN: [id number]  Setting: Ambulatory outpatient  PCP: Crissie Figures, PA-C  Type: Established Patient    Referring Provider: Crissie Figures, PA-C  Location: Office  Delivery: Face-to-face     HPI  Jocelyn [name], a 87 y.o. year old female, is here today because of her Lumbar facet arthropathy [M47.816]. [name] primary complain today is Back Pain (lower) and Knee Pain (bilateral) Last encounter: My last encounter with her was on 04/30/2020. Pertinent problems: [name] has Lumbar degenerative disc disease; Hearing loss; Cerebrovascular accident (CVA) ([place]); Controlled substance agreement signed; Arthritis of both knees; Carpal tunnel syndrome on right; SI joint arthritis; Chronic pain of both knees; and Chronic pain syndrome on their pertinent problem list. Pain Assessment: Severity of Chronic pain is reported as a 7 /10. Location: Back Lower/denies today. Onset:  . Quality: Aching, Burning, Discomfort. Timing: Intermittent. Modifying factor(s): rest, heat. Vitals:  height is 5' 4"  (1.626 m) and weight is 164 lb (74.4 kg). Her temperature is 97.2 F (36.2 C) (abnormal). Her blood pressure is 137/64 and her pulse is 66. Her respiration is 16 and oxygen saturation is 98%.   Reason for encounter: medication management.    No change in medical history since last visit.  Patient's pain is at baseline.   Patient continues multimodal pain regimen as prescribed.  States that it provides pain relief and improvement in functional status. Has started Fosamax  Pharmacotherapy Assessment   Analgesic: Hydrocodone 5 mg BID prn #60/month   Monitoring: [place] PMP: PDMP not reviewed this encounter.       Pharmacotherapy: No side-effects or adverse reactions reported. Compliance: No problems identified. Effectiveness: Clinically acceptable.  Ignatius Specking, RN  07/23/2020  8:20 AM  Sign when Signing Visit Nursing Pain Medication Assessment:  Safety precautions to be maintained throughout the outpatient stay will include: orient to surroundings, keep bed in low position, maintain call bell within reach at all times, provide assistance with transfer out of bed and ambulation.  Medication Inspection Compliance: Pill count conducted under aseptic conditions, in front of the patient. Neither the pills nor the bottle was removed from the patient's sight at any time. Once count was completed pills were immediately returned to the patient in their original bottle.  Medication: Hydrocodone/APAP Pill/Patch Count:  5 of 60 pills remain Pill/Patch Appearance: Markings consistent with prescribed medication Bottle Appearance: Standard pharmacy container. Clearly labeled. Filled Date: 5 / 10 / 2022 Last Medication intake:  Today   UDS:  Summary  Date Value Ref Range Status  11/21/2019 Note  Final    Comment:    ==================================================================== [employer] [facility] (MW) ==================================================================== Test                             Result       Flag       Units  Drug Present and Declared for Prescription Verification   Hydrocodone  871          EXPECTED   ng/mg creat   Hydromorphone                  285          EXPECTED   ng/mg creat   Dihydrocodeine                 113          EXPECTED   ng/mg creat   Norhydrocodone                  1030         EXPECTED   ng/mg creat    Sources of hydrocodone include scheduled prescription medications.    Hydromorphone, dihydrocodeine and norhydrocodone are expected    metabolites of hydrocodone. Hydromorphone and dihydrocodeine are    also available as scheduled prescription medications.  ==================================================================== Test                      Result    Flag   Units      Ref Range   Creatinine              96               mg/dL      >=20 ==================================================================== Declared Medications:  The flagging and interpretation on this report are based on the  following declared medications.  Unexpected results may arise from  inaccuracies in the declared medications.   **Note: The testing scope of this panel includes these medications:   Hydrocodone   **Note: The testing scope of this panel does not include the  following reported medications:   Acetaminophen  Albuterol  Amlodipine  Aspirin  Calcium  Clonidine (Catapres)  Diclofenac (Voltaren)  Famotidine  Hydrochlorothiazide  Iron  Isosorbide (Imdur)  Olmesartan  Omeprazole  Pravastatin  Vitamin D ==================================================================== For clinical consultation, please call [phone]. ====================================================================      ROS  Constitutional: Denies any fever or chills Gastrointestinal: No reported hemesis, hematochezia, vomiting, or acute GI distress Musculoskeletal:  Low back, bilateral knee pain Neurological: No reported episodes of acute onset apraxia, aphasia, dysarthria, agnosia, amnesia, paralysis, loss of coordination, or loss of consciousness  Medication Review  HYDROcodone-acetaminophen, Olmesartan-amLODIPine-HCTZ, albuterol, alendronate, aspirin EC, atorvastatin, calcium citrate-vitamin D, carvedilol, ferrous sulfate, isosorbide mononitrate,  omeprazole, and pravastatin  History Review  Allergy: [name] has No Known Allergies. Drug: [name]  reports no history of drug use. Alcohol:  reports no history of alcohol use. Tobacco:  reports that she has never smoked. She has never used smokeless tobacco. Social: Ms. Delancey  reports that she has never smoked. She has never used smokeless tobacco. She reports that she does not drink alcohol and does not use drugs. Medical:  has a past medical history of Arthritis of both knees (08/01/2015), Colitis, Essential hypertension, benign (07/28/2014), GERD (gastroesophageal reflux disease), iron deficiency anemia (09/02/2015), Hyperlipidemia, Hypertension, Lumbar pain, Medicare annual wellness visit, subsequent (04/07/2017), OSA on CPAP (08/01/2015), Overweight (BMI 25.0-29.9) (07/10/2013), Prediabetes (09/02/2015), and SOB (shortness of breath) (07/10/2013). Surgical: Ms. Hansman  has a past surgical history that includes Cataract extraction; Abdominal hysterectomy; Cholecystectomy; Lumbar disc surgery; and Colonoscopy with propofol (N/A, 04/09/2018). Family: family history includes Anuerysm in her son; Cancer in her daughter and father; Diabetes in her daughter and mother; Heart disease in her mother; Hypertension in her daughter.  Laboratory Chemistry Profile   Renal Lab Results  Component Value Date   BUN 14 08/01/2018   CREATININE 0.80 09/62/8366   BCR NOT APPLICABLE 29/47/6546   GFRAA 78 08/01/2018   GFRNONAA 67 08/01/2018     Hepatic Lab Results  Component Value Date   AST 20 03/02/2020   ALT 18 03/02/2020   ALBUMIN 4.1 03/02/2020   ALKPHOS 63 03/02/2020     Electrolytes Lab Results  Component Value Date   NA 140 08/01/2018   K 3.9 08/01/2018   CL 103 08/01/2018   CALCIUM 10.0 08/01/2018     Bone No results found for: VD25OH, VD125OH2TOT, [id number], LE7517GY1, 25OHVITD1, 25OHVITD2, 25OHVITD3, TESTOFREE, TESTOSTERONE   Inflammation (CRP: Acute Phase) (ESR: Chronic Phase) No  results found for: CRP, ESRSEDRATE, LATICACIDVEN     Note: Above Lab results reviewed.  Recent Imaging Review  DG BONE DENSITY (DXA) EXAM: DUAL X-RAY ABSORPTIOMETRY (DXA) FOR BONE MINERAL DENSITY  IMPRESSION: Your patient Minsa Weddington completed a BMD test on 05/12/2020 using the [employer] (software version: 14.10) manufactured by [employer]. The following summarizes the results of our evaluation. Technologist: SCE PATIENT BIOGRAPHICAL: Name: Mayley, Lish Patient ID: [id number] Birth Date: 11/20/1931 Height: 61.5 in. Gender: Female Exam Date: 05/12/2020 Weight: 163.2 lbs. Indications: Advanced Age, Height Loss, History of Fracture (Adult), History of Spinal Surgery, Hysterectomy, Postmenopausal, Rheumatoid Arthritis Fractures: Left wrist Treatments: calcium w/ vit D, Omeprazole DENSITOMETRY RESULTS: Site          Region      Measured Date Measured Age WHO Classification Young Adult T-score BMD         %Change vs. Previous Significant Change (*) DualFemur Total Right 05/12/2020 86.8 Osteoporosis -3.6 0.549 g/cm2 - -  Right Forearm Radius 33% 05/12/2020 86.8 Osteoporosis -4.1 0.518 g/cm2 - - ASSESSMENT:  The BMD measured at Forearm Radius 33% is 0.518 g/cm2 with a T-score of -4.1. This patient is considered osteoporotic according to [facility]) criteria. The scan quality is good. Lumbar spine was not utilized due to (advanced degenerative changes/scoliosis.  World Pharmacologist [facility]) criteria for post-menopausal, Caucasian Women: Normal:                   T-score at or above -1 SD Osteopenia/low bone mass: T-score between -1 and -2.5 SD Osteoporosis:             T-score at or below -2.5 SD  RECOMMENDATIONS:  1. All patients should optimize calcium and vitamin D intake. 2. Consider FDA-approved medical therapies in postmenopausal women and men aged 23 years and older, based on the following: a. A hip or  vertebral(clinical or morphometric) fracture b. T-score < -2.5 at the femoral neck or spine after appropriate evaluation to exclude secondary causes c. Low bone mass (T-score between -1.0 and -2.5 at the femoral neck or spine) and a 10-year probability of a hip fracture > 3% or a 10-year probability of a major osteoporosis-related fracture > 20% based on the [place]-adapted WHO algorithm 3. Clinician judgment and/or patient preferences may indicate treatment for people with 10-year fracture probabilities above or below these levels  FOLLOW-UP:  People with diagnosed cases of osteoporosis or at high risk for fracture should have regular bone mineral density tests. For patients eligible for Medicare, routine testing is allowed once every 2 years. The testing frequency can be increased to one year for patients who have rapidly progressing disease, those who are receiving or discontinuing medical therapy to restore bone mass, or have additional risk factors.  I have reviewed this report, and agree with the above findings.  [facility]  Electronically Signed   By: Rolm Baptise M.D.   On: 05/12/2020 12:57 Note: Reviewed        Physical Exam  General appearance: Well nourished, well developed, and well hydrated. In no apparent acute distress Mental status: Alert, oriented x 3 (person, place, & time)       Respiratory: No evidence of acute respiratory distress Eyes: PERLA Vitals: BP 137/64   Pulse 66   Temp (!) 97.2 F (36.2 C)   Resp 16   Ht 5' 4"  (1.626 m)   Wt 164 lb (74.4 kg)   SpO2 98%   BMI 28.15 kg/m  BMI: Estimated body mass index is 28.15 kg/m as calculated from the following:   Height as of this encounter: 5' 4"  (1.626 m).   Weight as of this encounter: 164 lb (74.4 kg). Ideal: Ideal body weight: 54.7 kg (120 lb 9.5 oz) Adjusted ideal body weight: 62.6 kg (137 lb 15.3 oz)  Patient presents today in wheelchair.  Low back pain, bilateral knee  pain.  Assessment   Status Diagnosis  Controlled Controlled Controlled 1. Lumbar facet arthropathy   2. Lumbar degenerative disc disease   3. SI joint arthritis   4. Chronic pain syndrome      Plan of Care  Ms. NALANY STEEDLEY has a current medication list which includes the following long-term medication(s): atorvastatin, calcium citrate-vitamin d, carvedilol, isosorbide mononitrate, olmesartan-amlodipine-hctz, and ferrous sulfate.  Pharmacotherapy (Medications Ordered): Meds ordered this encounter  Medications   HYDROcodone-acetaminophen (NORCO/VICODIN) 5-325 MG tablet    Sig: Take 1 tablet by mouth every 12 (twelve) hours as needed for severe pain. Must last 30 days.    Dispense:  60 tablet    Refill:  0    Chronic Pain: STOP Act (Not applicable) Fill 1 day early if closed on refill date. Avoid benzodiazepines within 8 hours of opioids    Follow-up plan:   Return in about 2 months (around 09/22/2020) for Medication Management, in person.   Recent Visits Date Type Provider Dept  04/30/20 Office Visit Gillis Santa, MD [facility]  Showing recent visits within past 90 days and meeting all other requirements Today's Visits Date Type Provider Dept  07/23/20 Office Visit Gillis Santa, MD [facility]  Showing today's visits and meeting all other requirements Future Appointments Date Type Provider Dept  09/22/20 Appointment Gillis Santa, MD [facility]  Showing future appointments within next 90 days and meeting all other requirements I discussed the assessment and treatment plan with the patient. The patient was provided an opportunity to ask questions and all were answered. The patient agreed with the plan and demonstrated an understanding of the instructions.  Patient advised to call back or seek an in-person evaluation if the symptoms or condition worsens.  Duration of encounter: 30 minutes.  Note by: Gillis Santa, MD Date: 07/23/2020;  Time: 8:52 AM

## 2020-08-12 ENCOUNTER — Other Ambulatory Visit: Payer: Self-pay | Admitting: Internal Medicine

## 2020-08-18 ENCOUNTER — Telehealth: Payer: Medicare Other | Admitting: Student in an Organized Health Care Education/Training Program

## 2020-08-21 ENCOUNTER — Other Ambulatory Visit: Payer: Self-pay | Admitting: Physician Assistant

## 2020-08-21 ENCOUNTER — Other Ambulatory Visit: Payer: Self-pay | Admitting: Internal Medicine

## 2020-08-24 ENCOUNTER — Telehealth: Payer: Self-pay

## 2020-08-24 NOTE — Telephone Encounter (Signed)
I called pt and left a VM she did not answer.

## 2020-08-24 NOTE — Telephone Encounter (Signed)
I have reviewed the chart and the last Rx she had to fill was 07/23/20.  Her pill count at that time relfected a fill date of 06/09/20 and at that time she had 5/60 and she can take 2 tabs per day.  I saw no other Rx's for hydrocodone - apap 5 - 325 mg.

## 2020-08-24 NOTE — Telephone Encounter (Signed)
Pt need a refill on  HYDROCODONE 5-325 MG

## 2020-08-25 ENCOUNTER — Encounter: Payer: Self-pay | Admitting: Student in an Organized Health Care Education/Training Program

## 2020-08-25 ENCOUNTER — Other Ambulatory Visit: Payer: Self-pay

## 2020-08-25 ENCOUNTER — Ambulatory Visit
Payer: Medicare Other | Attending: Student in an Organized Health Care Education/Training Program | Admitting: Student in an Organized Health Care Education/Training Program

## 2020-08-25 DIAGNOSIS — M47818 Spondylosis without myelopathy or radiculopathy, sacral and sacrococcygeal region: Secondary | ICD-10-CM | POA: Diagnosis not present

## 2020-08-25 DIAGNOSIS — M170 Bilateral primary osteoarthritis of knee: Secondary | ICD-10-CM

## 2020-08-25 DIAGNOSIS — M5136 Other intervertebral disc degeneration, lumbar region: Secondary | ICD-10-CM

## 2020-08-25 DIAGNOSIS — M25561 Pain in right knee: Secondary | ICD-10-CM

## 2020-08-25 DIAGNOSIS — M1711 Unilateral primary osteoarthritis, right knee: Secondary | ICD-10-CM

## 2020-08-25 DIAGNOSIS — G8929 Other chronic pain: Secondary | ICD-10-CM

## 2020-08-25 DIAGNOSIS — G894 Chronic pain syndrome: Secondary | ICD-10-CM

## 2020-08-25 DIAGNOSIS — M47816 Spondylosis without myelopathy or radiculopathy, lumbar region: Secondary | ICD-10-CM

## 2020-08-25 DIAGNOSIS — M533 Sacrococcygeal disorders, not elsewhere classified: Secondary | ICD-10-CM

## 2020-08-25 DIAGNOSIS — M25562 Pain in left knee: Secondary | ICD-10-CM

## 2020-08-25 MED ORDER — HYDROCODONE-ACETAMINOPHEN 5-325 MG PO TABS: 1.0000 | ORAL_TABLET | Freq: Two times a day (BID) | ORAL | 0 refills | Status: DC | PRN

## 2020-08-25 NOTE — Progress Notes (Signed)
Patient: Jocelyn [name]  Service Category: E/M  Provider: Gillis Santa, MD  DOB: May 13, 1931  DOS: 08/25/2020  Location: Office  MRN: [id number]  Setting: Ambulatory outpatient  Referring Provider: Crissie Figures, PA-C  Type: Established Patient  Specialty: Interventional Pain Management  PCP: Crissie Figures, PA-C  Location: Home  Delivery: TeleHealth     Virtual Encounter - Pain Management PROVIDER NOTE: Information contained herein reflects review and annotations entered in association with encounter. Interpretation of such information and data should be left to medically-trained personnel. Information provided to patient can be located elsewhere in the medical record under "Patient Instructions". Document created using STT-dictation technology, any transcriptional errors that may result from process are unintentional.    Contact & Pharmacy Preferred: [phone] Home: [phone] (home) Mobile: [phone] (mobile) E-mail: No e-mail address on record  [id number] [place] [address]: [phone]: [phone]  Pre-screening  [name] "in-person" vs "virtual" encounter. She indicated preferring virtual for this encounter.   Reason COVID-19*  Social distancing based on CDC and AMA recommendations.   I contacted Jocelyn [name] 08/25/2020 via video conference.      I clearly identified myself as BGillis Santa MD. I verified that I was speaking with the correct person using two identifiers (Name: Jocelyn [name] and date of birth: 5Feb 13, 1933.  Consent I sought verbal advanced consent from Jocelyn [name] virtual visit interactions. I informed [name] security and privacy concerns, risks, and limitations associated with providing "not-in-person" medical evaluation and management services. I also informed Ms. [name] the availability of "in-person" appointments. Finally, I  informed her that there would be a charge for the virtual visit and that she could be  personally, fully or partially, financially responsible for it. Ms. FGarmonexpressed understanding and agreed to proceed.   Historic Elements   Ms. Jocelyn STREETis a 87y.o. year old, female patient evaluated today after our last contact on 07/23/2020. [name] has a past medical history of Arthritis of both knees (08/01/2015), Colitis, Essential hypertension, benign (07/28/2014), GERD (gastroesophageal reflux disease), iron deficiency anemia (09/02/2015), Hyperlipidemia, Hypertension, Lumbar pain, Medicare annual wellness visit, subsequent (04/07/2017), OSA on CPAP (08/01/2015), Overweight (BMI 25.0-29.9) (07/10/2013), Prediabetes (09/02/2015), and SOB (shortness of breath) (07/10/2013). She also  has a past surgical history that includes Cataract extraction; Abdominal hysterectomy; Cholecystectomy; Lumbar disc surgery; and Colonoscopy with propofol (N/A, 04/09/2018). [name] a current medication list which includes the following prescription(s): hydrocodone-acetaminophen, [START ON 09/24/2020] hydrocodone-acetaminophen, albuterol, alendronate, aspirin ec, atorvastatin, calcium citrate-vitamin d, carvedilol, ferrous sulfate, isosorbide mononitrate, olmesartan-amlodipine-hctz, omeprazole, and pravastatin. She  reports that she has never smoked. She has never used smokeless tobacco. She reports that she does not drink alcohol and does not use drugs. [name] No Known Allergies.   HPI  Today, she is being contacted for medication management.  No change in medical history since last visit.  Patient's pain is at baseline.  Patient continues multimodal pain regimen as prescribed.  States that it provides pain relief and improvement in functional status. Patient unable to come in today given transportation issues.  She ran out of her medication approximately 3 days ago and has been having increased pain and is we are doing  this visit virtually. She has been compliant with therapy in the past.  Pharmacotherapy Assessment   Analgesic: Hydrocodone 5 mg BID prn #60/month  Monitoring: [place] PMP: PDMP not reviewed this encounter.       Pharmacotherapy: No side-effects or adverse reactions reported. Compliance: No problems identified. Effectiveness: Clinically acceptable. Plan: Refer to "POC". UDS:  Summary  Date Value Ref Range Status  11/21/2019 Note  Final    Comment:    ==================================================================== [employer] Select 13 (MW) ==================================================================== Test                             Result       Flag       Units  Drug Present and Declared for Prescription Verification   Hydrocodone                    871          EXPECTED   ng/mg creat   Hydromorphone                  285          EXPECTED   ng/mg creat   Dihydrocodeine                 113          EXPECTED   ng/mg creat   Norhydrocodone                 1030         EXPECTED   ng/mg creat    Sources of hydrocodone include scheduled prescription medications.    Hydromorphone, dihydrocodeine and norhydrocodone are expected    metabolites of hydrocodone. Hydromorphone and dihydrocodeine are    also available as scheduled prescription medications.  ==================================================================== Test                      Result    Flag   Units      Ref Range   Creatinine              96               mg/dL      >=20 ==================================================================== Declared Medications:  The flagging and interpretation on this report are based on the  following declared medications.  Unexpected results may arise from  inaccuracies in the declared medications.   **Note: The testing scope of this panel includes these medications:   Hydrocodone   **Note: The testing scope of this panel does not include the  following reported  medications:   Acetaminophen  Albuterol  Amlodipine  Aspirin  Calcium  Clonidine (Catapres)  Diclofenac (Voltaren)  Famotidine  Hydrochlorothiazide  Iron  Isosorbide (Imdur)  Olmesartan  Omeprazole  Pravastatin  Vitamin D ==================================================================== For clinical consultation, please call [phone]. ====================================================================      Laboratory Chemistry Profile   Renal Lab Results  Component Value Date   BUN 14 08/01/2018   CREATININE 0.80 99/35/7017   BCR NOT APPLICABLE 79/39/0300   GFRAA 78 08/01/2018   GFRNONAA 67 08/01/2018    Hepatic Lab Results  Component Value Date   AST 20 03/02/2020   ALT 18 03/02/2020   ALBUMIN 4.1 03/02/2020   ALKPHOS 63 03/02/2020    Electrolytes Lab Results  Component Value Date   NA 140 08/01/2018   K 3.9 08/01/2018   CL 103 08/01/2018   CALCIUM 10.0 08/01/2018    Bone No results found for: VD25OH, VD125OH2TOT, PQ3300TM2, UQ3335KT6, 25OHVITD1, 25OHVITD2, 25OHVITD3, TESTOFREE, TESTOSTERONE  Inflammation (CRP: Acute Phase) (ESR: Chronic  Phase) No results found for: CRP, ESRSEDRATE, LATICACIDVEN       Note: Above Lab results reviewed.  Imaging  DG BONE DENSITY (DXA) EXAM: DUAL X-RAY ABSORPTIOMETRY (DXA) FOR BONE MINERAL DENSITY  IMPRESSION: Your patient Melvena Vink completed a BMD test on 05/12/2020 using the [employer] (software version: 14.10) manufactured by [employer]. The following summarizes the results of our evaluation. Technologist: SCE PATIENT BIOGRAPHICAL: Name: Lindy, Garczynski Patient ID: [id number] Birth Date: 10/24/1931 Height: 61.5 in. Gender: Female Exam Date: 05/12/2020 Weight: 163.2 lbs. Indications: Advanced Age, Height Loss, History of Fracture (Adult), History of Spinal Surgery, Hysterectomy, Postmenopausal, Rheumatoid Arthritis Fractures: Left wrist Treatments: calcium w/ vit  D, Omeprazole DENSITOMETRY RESULTS: Site          Region      Measured Date Measured Age WHO Classification Young Adult T-score BMD         %Change vs. Previous Significant Change (*) DualFemur Total Right 05/12/2020 86.8 Osteoporosis -3.6 0.549 g/cm2 - -  Right Forearm Radius 33% 05/12/2020 86.8 Osteoporosis -4.1 0.518 g/cm2 - - ASSESSMENT:  The BMD measured at Forearm Radius 33% is 0.518 g/cm2 with a T-score of -4.1. This patient is considered osteoporotic according to [facility]) criteria. The scan quality is good. Lumbar spine was not utilized due to (advanced degenerative changes/scoliosis.  World Pharmacologist [facility]) criteria for post-menopausal, Caucasian Women: Normal:                   T-score at or above -1 SD Osteopenia/low bone mass: T-score between -1 and -2.5 SD Osteoporosis:             T-score at or below -2.5 SD  RECOMMENDATIONS:  1. All patients should optimize calcium and vitamin D intake. 2. Consider FDA-approved medical therapies in postmenopausal women and men aged 62 years and older, based on the following: a. A hip or vertebral(clinical or morphometric) fracture b. T-score < -2.5 at the femoral neck or spine after appropriate evaluation to exclude secondary causes c. Low bone mass (T-score between -1.0 and -2.5 at the femoral neck or spine) and a 10-year probability of a hip fracture > 3% or a 10-year probability of a major osteoporosis-related fracture > 20% based on the [place]-adapted WHO algorithm 3. Clinician judgment and/or patient preferences may indicate treatment for people with 10-year fracture probabilities above or below these levels  FOLLOW-UP:  People with diagnosed cases of osteoporosis or at high risk for fracture should have regular bone mineral density tests. For patients eligible for Medicare, routine testing is allowed once every 2 years. The testing frequency can be increased to one year for patients  who have rapidly progressing disease, those who are receiving or discontinuing medical therapy to restore bone mass, or have additional risk factors.  I have reviewed this report, and agree with the above findings.  [facility]  Electronically Signed   By: Rolm Baptise M.D.   On: 05/12/2020 12:57  Assessment  The primary encounter diagnosis was Lumbar facet arthropathy. Diagnoses of Lumbar degenerative disc disease, SI joint arthritis, Chronic pain of both knees, Chronic SI joint pain, Bilateral primary osteoarthritis of knee, Primary osteoarthritis of right knee, and Chronic pain syndrome were also pertinent to this visit.  Plan of Care    Ms. KEILEE DENMAN has a current medication list which includes the following long-term medication(s): atorvastatin, calcium citrate-vitamin d, carvedilol, ferrous sulfate, isosorbide mononitrate, and olmesartan-amlodipine-hctz.  Pharmacotherapy (Medications Ordered):  Meds ordered this encounter  Medications   HYDROcodone-acetaminophen (NORCO/VICODIN) 5-325 MG tablet    Sig: Take 1 tablet by mouth every 12 (twelve) hours as needed for severe pain. Must last 30 days.    Dispense:  60 tablet    Refill:  0    Chronic Pain: STOP Act (Not applicable) Fill 1 day early if closed on refill date. Avoid benzodiazepines within 8 hours of opioids   HYDROcodone-acetaminophen (NORCO/VICODIN) 5-325 MG tablet    Sig: Take 1 tablet by mouth every 12 (twelve) hours as needed for severe pain. Must last 30 days.    Dispense:  60 tablet    Refill:  0    Chronic Pain: STOP Act (Not applicable) Fill 1 day early if closed on refill date. Avoid benzodiazepines within 8 hours of opioids    Orders:  No orders of the defined types were placed in this encounter.  Follow-up plan:   Return in about 10 weeks (around 11/03/2020) for Medication Management, in person.    Recent Visits Date Type Provider Dept  07/23/20 Office Visit Gillis Santa, MD  [facility]  Showing recent visits within past 90 days and meeting all other requirements Today's Visits Date Type Provider Dept  08/25/20 Telemedicine Gillis Santa, MD [facility]  Showing today's visits and meeting all other requirements Future Appointments Date Type Provider Dept  09/22/20 Appointment Gillis Santa, MD [facility]  Showing future appointments within next 90 days and meeting all other requirements I discussed the assessment and treatment plan with the patient. The patient was provided an opportunity to ask questions and all were answered. The patient agreed with the plan and demonstrated an understanding of the instructions.  Patient advised to call back or seek an in-person evaluation if the symptoms or condition worsens.  Duration of encounter: 32mnutes.  Note by: BGillis Santa MD Date: 08/25/2020; Time: 3:06 PM

## 2020-09-01 ENCOUNTER — Telehealth: Payer: Self-pay | Admitting: *Deleted

## 2020-09-01 NOTE — Telephone Encounter (Signed)
error 

## 2020-09-16 ENCOUNTER — Telehealth: Payer: Self-pay | Admitting: *Deleted

## 2020-09-16 ENCOUNTER — Other Ambulatory Visit: Payer: Self-pay

## 2020-09-16 ENCOUNTER — Ambulatory Visit
Payer: Medicare Other | Attending: Student in an Organized Health Care Education/Training Program | Admitting: Student in an Organized Health Care Education/Training Program

## 2020-09-16 ENCOUNTER — Telehealth: Payer: Self-pay | Admitting: Student in an Organized Health Care Education/Training Program

## 2020-09-16 DIAGNOSIS — M47816 Spondylosis without myelopathy or radiculopathy, lumbar region: Secondary | ICD-10-CM

## 2020-09-16 NOTE — Progress Notes (Signed)
I attempted to call the patient however no response. Voicemail left instructing patient to call front desk office at [phone] to reschedule appointment. -Dr Sharniece Gibbon  

## 2020-09-16 NOTE — Telephone Encounter (Signed)
Called patient back re;  BL reply.  Told her that he would like to discuss her medications.  I told her we would let her know what time.

## 2020-09-16 NOTE — Telephone Encounter (Signed)
[facility] for hydrocodone - apap 5-325 mg.  [facility].

## 2020-09-16 NOTE — Telephone Encounter (Signed)
Pateint states she has had to take extra meds and needs to get refill now. She is scheduled for 09-22-20. Please call patient and adviser about meds. She states Dr. Holley Raring told her to call if she needed to fill early

## 2020-09-16 NOTE — Telephone Encounter (Signed)
Returned patient phone call to discuss how she is taking medications.  She said that she had infection in her hand and that has healed but she continues to have sharp pain in that hand.  States she was having to take additional meds during the healing process.  She states that BL told her that if she ran out early just to call and he would send more.  I told her that I would give BL the message and let her know what he says.  She reports that she one pill left and next fill is not until 09/24/20.

## 2020-09-17 ENCOUNTER — Telehealth: Payer: Self-pay | Admitting: Student in an Organized Health Care Education/Training Program

## 2020-09-18 ENCOUNTER — Other Ambulatory Visit: Payer: Self-pay

## 2020-09-18 ENCOUNTER — Ambulatory Visit (INDEPENDENT_AMBULATORY_CARE_PROVIDER_SITE_OTHER): Payer: Medicare Other | Admitting: Internal Medicine

## 2020-09-18 ENCOUNTER — Encounter: Payer: Self-pay | Admitting: Internal Medicine

## 2020-09-18 VITALS — BP 142/60 | HR 68 | Ht 61.0 in | Wt 161.0 lb

## 2020-09-18 DIAGNOSIS — R0600 Dyspnea, unspecified: Secondary | ICD-10-CM

## 2020-09-18 DIAGNOSIS — I10 Essential (primary) hypertension: Secondary | ICD-10-CM | POA: Diagnosis not present

## 2020-09-18 DIAGNOSIS — I208 Other forms of angina pectoris: Secondary | ICD-10-CM

## 2020-09-18 DIAGNOSIS — G8929 Other chronic pain: Secondary | ICD-10-CM | POA: Diagnosis not present

## 2020-09-18 DIAGNOSIS — R0609 Other forms of dyspnea: Secondary | ICD-10-CM

## 2020-09-18 NOTE — Patient Instructions (Signed)
Medication Instructions:   Your physician recommends that you continue on your current medications as directed. Please refer to the Current Medication list given to you today.  *If you need a refill on your cardiac medications before your next appointment, please call your pharmacy*   Lab Work:  None ordered  Testing/Procedures:  None ordered   Follow-Up: At [facility], you and your health needs are our priority.  As part of our continuing mission to provide you with exceptional heart care, we have created designated Provider Care Teams.  These Care Teams include your primary Cardiologist (physician) and Advanced Practice Providers (APPs -  Physician Assistants and Nurse Practitioners) who all work together to provide you with the care you need, when you need it.  We recommend signing up for the patient portal called "MyChart".  Sign up information is provided on this After Visit Summary.  MyChart is used to connect with patients for Virtual Visits (Telemedicine).  Patients are able to view lab/test results, encounter notes, upcoming appointments, etc.  Non-urgent messages can be sent to your provider as well.   To learn more about what you can do with MyChart, go to [email or web address].    Your next appointment:   6 month(s)  The format for your next appointment:   In Person  Provider:   You may see one of the following Advanced Practice Providers on your designated Care Team:   Murray Hodgkins, NP Christell Faith, PA-C Marrianne Mood, PA-C Cadence Covington, [place]

## 2020-09-18 NOTE — Progress Notes (Signed)
Follow-up Outpatient Visit Date: 09/18/2020  Primary Care Provider: Crissie Figures, PA-C [place] [address]  Chief Complaint: Follow-up shortness of breath  HPI:  Jocelyn [name] is a 87 y.o. female with history of hypertension, hyperlipidemia, stroke, prediabetes, obstructive sleep apnea on CPAP, and GERD, who presents for follow-up of shortness of breath.  She was last seen in our office in January by Christell Faith, PA, at which time she reported mild exertional dyspnea which was nonlifestyle limiting.  No medication changes or additional testing were pursued.  Today, Ms. Feuling reports that her exertional dyspnea is about the same as at prior visits.  It frequently improves when she uses her inhaler.  She denies chest pain as well as dyspnea at rest.  She has not had any palpitations, lightheadedness, or edema.  Her main complaint is of pain in her hands and the legs.  She requests analgesics as she has already exhausted her supply of opioids prescribed by her pain management specialist.  --------------------------------------------------------------------------------------------------  Cardiovascular History & Procedures: Cardiovascular Problems: Stroke Dyspnea on exertion Stable angina   Risk Factors: Stroke, hypertension, hyperlipidemia, and prediabetes   Cath/PCI: None   CV Surgery: None   EP Procedures and Devices: None   Non-Invasive Evaluation(s): Pharmacologic MPI (12/12/2019):  TTE (11/01/2019): Normal LV size with severe LVH.  LVEF 60-65% with grade 1 diastolic dysfunction.  Normal RV size and function.  Moderate left atrial enlargement.  Mild mitral regurgitation with moderate annular calcification. Pharmacologic MPI (07/14/2010): Abnormal study with small, fixed apical defect.  LVEF 44% with global hypokinesis and more severe hypokinesis at the apex.  Recent CV Pertinent Labs: Lab Results  Component Value Date   CHOL 128 03/02/2020   HDL 55  03/02/2020   LDLCALC 60 03/02/2020   LDLCALC 120 (H) 08/01/2018   LDLDIRECT 65 03/02/2020   TRIG 60 03/02/2020   CHOLHDL 2.3 03/02/2020   CHOLHDL 3.1 08/01/2018   K 3.9 08/01/2018   K 3.4 (L) 04/13/2011   BUN 14 08/01/2018   BUN 24 07/08/2015   BUN 14 04/13/2011   CREATININE 0.80 08/01/2018    Past medical and surgical history were reviewed and updated in EPIC.  Current Meds  Medication Sig   albuterol (VENTOLIN HFA) 108 (90 Base) MCG/ACT inhaler Inhale 1 puff into the lungs as needed.   alendronate (FOSAMAX) 70 MG tablet Take 70 mg by mouth once a week.   aspirin EC 81 MG tablet Take 1 tablet (81 mg total) by mouth daily.   atorvastatin (LIPITOR) 40 MG tablet Take 1 tablet (40 mg total) by mouth daily. Pt needs to keep upcoming appt in Aug for further refills   calcium citrate-vitamin D 500-400 MG-UNIT chewable tablet Chew 1 tablet by mouth 2 (two) times daily.    carvedilol (COREG) 3.125 MG tablet TAKE 1 TABLET BY MOUTH TWICE A DAY   diclofenac Sodium (VOLTAREN) 1 % GEL Apply topically every morning.   ferrous sulfate 325 (65 FE) MG tablet Take 1 tablet (325 mg total) by mouth daily.   isosorbide mononitrate (IMDUR) 120 MG 24 hr tablet TAKE 1 TABLET BY MOUTH EVERY DAY   Olmesartan-amLODIPine-HCTZ 40-10-12.5 MG TABS Take 1 tablet by mouth daily. For blood pressure   omeprazole (PRILOSEC) 20 MG capsule Take 20 mg by mouth daily.   pravastatin (PRAVACHOL) 20 MG tablet Take 20 mg by mouth at bedtime.    Allergies: Patient has no known allergies.  Social History   Tobacco Use   Smoking  status: Never   Smokeless tobacco: Never  Vaping Use   Vaping Use: Never used  Substance Use Topics   Alcohol use: No    Alcohol/week: 0.0 standard drinks   Drug use: No    Family History  Problem Relation Age of Onset   Hypertension Daughter    Diabetes Daughter    Cancer Daughter        breast   Diabetes Mother    Heart disease Mother    Cancer Father        lung; smoker    Anuerysm Son     Review of Systems: A 12-system review of systems was performed and was negative except as noted in the HPI.  --------------------------------------------------------------------------------------------------  Physical Exam: BP (!) 142/60 (BP Location: Right Arm, Patient Position: Sitting, Cuff Size: Large)   Pulse 68   Ht '5\' 1"'$  (1.549 m)   Wt 161 lb (73 kg)   SpO2 96%   BMI 30.42 kg/m   General:  NAD. Neck: No JVD or HJR. Lungs: Clear to auscultation bilaterally without wheezes or crackles. Heart: Regular rate and rhythm without murmurs, rubs, or gallops. Abdomen: Soft, nontender, nondistended. Extremities: No lower extremity edema.  EKG: Normal sinus rhythm with poor R wave progression in V1 and V2 and nonspecific T wave abnormality.  No significant change since 10/09/2019.  Lab Results  Component Value Date   WBC 8.6 08/01/2018   HGB 11.5 (L) 08/01/2018   HCT 35.1 08/01/2018   MCV 84.2 08/01/2018   PLT 192 08/01/2018    Lab Results  Component Value Date   NA 140 08/01/2018   K 3.9 08/01/2018   CL 103 08/01/2018   CO2 28 08/01/2018   BUN 14 08/01/2018   CREATININE 0.80 08/01/2018   GLUCOSE 87 08/01/2018   ALT 18 03/02/2020    Lab Results  Component Value Date   CHOL 128 03/02/2020   HDL 55 03/02/2020   LDLCALC 60 03/02/2020   LDLDIRECT 65 03/02/2020   TRIG 60 03/02/2020   CHOLHDL 2.3 03/02/2020    --------------------------------------------------------------------------------------------------  ASSESSMENT AND PLAN: Dyspnea on exertion and stable angina: Exertional dyspnea is unchanged from prior visits.  This is likely multifactorial including underlying lung disease and possible component of coronary insufficiency.  Continue current medications including carvedilol and isosorbide mononitrate.  Hypertension: Blood pressure mildly elevated today in the setting of pain from having run out of her opiate pain medications.  We will not make  any medication changes at this time.  Chronic pain: Ongoing management per pain management.  No new prescriptions provided today.  Follow-up: Return to clinic in 6 months with APP.  Nelva Bush, MD 09/18/2020 9:27 AM

## 2020-09-19 ENCOUNTER — Encounter: Payer: Self-pay | Admitting: Internal Medicine

## 2020-09-22 ENCOUNTER — Encounter: Payer: Self-pay | Admitting: Student in an Organized Health Care Education/Training Program

## 2020-09-22 ENCOUNTER — Ambulatory Visit
Payer: Medicare Other | Attending: Student in an Organized Health Care Education/Training Program | Admitting: Student in an Organized Health Care Education/Training Program

## 2020-09-22 ENCOUNTER — Other Ambulatory Visit: Payer: Self-pay

## 2020-09-22 DIAGNOSIS — M5136 Other intervertebral disc degeneration, lumbar region: Secondary | ICD-10-CM | POA: Insufficient documentation

## 2020-09-22 DIAGNOSIS — M1711 Unilateral primary osteoarthritis, right knee: Secondary | ICD-10-CM | POA: Diagnosis not present

## 2020-09-22 DIAGNOSIS — M47816 Spondylosis without myelopathy or radiculopathy, lumbar region: Secondary | ICD-10-CM | POA: Diagnosis not present

## 2020-09-22 DIAGNOSIS — G894 Chronic pain syndrome: Secondary | ICD-10-CM | POA: Diagnosis not present

## 2020-09-22 MED ORDER — HYDROCODONE-ACETAMINOPHEN 5-325 MG PO TABS: 1.0000 | ORAL_TABLET | Freq: Two times a day (BID) | ORAL | 0 refills | Status: AC | PRN

## 2020-09-22 NOTE — Progress Notes (Signed)
PROVIDER NOTE: Information contained herein reflects review and annotations entered in association with encounter. Interpretation of such information and data should be left to medically-trained personnel. Information provided to patient can be located elsewhere in the medical record under "Patient Instructions". Document created using STT-dictation technology, any transcriptional errors that may result from process are unintentional.    Patient: Jocelyn [name]  Service Category: E/M  Provider: Gillis Santa, MD  DOB: Jun 01, 1931  DOS: 09/22/2020  Specialty: Interventional Pain Management  MRN: [id number]  Setting: Ambulatory outpatient  PCP: Crissie Figures, PA-C  Type: Established Patient    Referring Provider: Crissie Figures, PA-C  Location: Office  Delivery: Face-to-face     HPI  Jocelyn [name], a 87 y.o. year old female, is here today because of her No primary diagnosis found.. Jocelyn [name] primary complain today is Back Pain (Lumbar midline ), Knee Pain (Bilateral ), and Hand Pain (Bilateral, right hand is worse d/t infection that is currently healed up. ) Last encounter: My last encounter with her was on 09/16/2020. Pertinent problems: Jocelyn [name] has Lumbar degenerative disc disease; Hearing loss; Cerebrovascular accident (CVA) ([place]); Controlled substance agreement signed; Arthritis of both knees; Carpal tunnel syndrome on right; SI joint arthritis; Chronic pain of both knees; and Chronic pain syndrome on their pertinent problem list. Pain Assessment: Severity of Chronic pain is reported as a 7 /10. Location: Back (see visit info for additional pain sites.) Lower/right leg pain unsure if coming from back, outer below knee. Onset: More than a month ago. Quality: Discomfort, Constant, Sharp. Timing: Constant. Modifying factor(s): applying pressure to the hand.. Vitals:  height is 5' 1"  (1.549 m) and weight is 161 lb (73 kg). Her temporal temperature is 97.3 F (36.3 C) (abnormal). Her blood  pressure is 156/65 (abnormal) and her pulse is 63. Her respiration is 16 and oxygen saturation is 97%.   Reason for encounter: medication management.    No change in medical history since last visit.  Patient's pain is at baseline.  Patient continues multimodal pain regimen as prescribed.  States that it provides pain relief and improvement in functional status.    Pharmacotherapy Assessment  Analgesic: Hydrocodone 5 mg BID prn #60/month   Monitoring: [place] PMP: PDMP reviewed during this encounter.       Pharmacotherapy: No side-effects or adverse reactions reported. Compliance: No problems identified. Effectiveness: Clinically acceptable.  UDS:  Summary  Date Value Ref Range Status  11/21/2019 Note  Final    Comment:    ==================================================================== [employer] [facility] (MW) ==================================================================== Test                             Result       Flag       Units  Drug Present and Declared for Prescription Verification   Hydrocodone                    871          EXPECTED   ng/mg creat   Hydromorphone                  285          EXPECTED   ng/mg creat   Dihydrocodeine                 113          EXPECTED   ng/mg creat   Norhydrocodone  1030         EXPECTED   ng/mg creat    Sources of hydrocodone include scheduled prescription medications.    Hydromorphone, dihydrocodeine and norhydrocodone are expected    metabolites of hydrocodone. Hydromorphone and dihydrocodeine are    also available as scheduled prescription medications.  ==================================================================== Test                      Result    Flag   Units      Ref Range   Creatinine              96               mg/dL      >=20 ==================================================================== Declared Medications:  The flagging and interpretation on this report are based on the  following  declared medications.  Unexpected results may arise from  inaccuracies in the declared medications.   **Note: The testing scope of this panel includes these medications:   Hydrocodone   **Note: The testing scope of this panel does not include the  following reported medications:   Acetaminophen  Albuterol  Amlodipine  Aspirin  Calcium  Clonidine (Catapres)  Diclofenac (Voltaren)  Famotidine  Hydrochlorothiazide  Iron  Isosorbide (Imdur)  Olmesartan  Omeprazole  Pravastatin  Vitamin D ==================================================================== For clinical consultation, please call [phone]. ====================================================================       ROS  Constitutional: Denies any fever or chills Gastrointestinal: No reported hemesis, hematochezia, vomiting, or acute GI distress Musculoskeletal:  Low back, bilateral knee pain Neurological: No reported episodes of acute onset apraxia, aphasia, dysarthria, agnosia, amnesia, paralysis, loss of coordination, or loss of consciousness  Medication Review  HYDROcodone-acetaminophen, Olmesartan-amLODIPine-HCTZ, albuterol, alendronate, aspirin EC, atorvastatin, calcium citrate-vitamin D, carvedilol, diclofenac Sodium, ferrous sulfate, isosorbide mononitrate, and omeprazole  History Review  Allergy: Jocelyn [name] has No Known Allergies. Drug: Jocelyn [name]  reports no history of drug use. Alcohol:  reports no history of alcohol use. Tobacco:  reports that she has never smoked. She has never used smokeless tobacco. Social: Jocelyn [name]  reports that she has never smoked. She has never used smokeless tobacco. She reports that she does not drink alcohol and does not use drugs. Medical:  has a past medical history of Arthritis of both knees (08/01/2015), Colitis, Essential hypertension, benign (07/28/2014), GERD (gastroesophageal reflux disease), iron deficiency anemia (09/02/2015), Hyperlipidemia, Hypertension,  Lumbar pain, Medicare annual wellness visit, subsequent (04/07/2017), OSA on CPAP (08/01/2015), Overweight (BMI 25.0-29.9) (07/10/2013), Prediabetes (09/02/2015), and SOB (shortness of breath) (07/10/2013). Surgical: Jocelyn [name]  has a past surgical history that includes Cataract extraction; Abdominal hysterectomy; Cholecystectomy; Lumbar disc surgery; and Colonoscopy with propofol (N/A, 04/09/2018). Family: family history includes Anuerysm in her son; Cancer in her daughter and father; Diabetes in her daughter and mother; Heart disease in her mother; Hypertension in her daughter.  Laboratory Chemistry Profile   Renal Lab Results  Component Value Date   BUN 14 08/01/2018   CREATININE 0.80 26/94/8546   BCR NOT APPLICABLE 27/04/5007   GFRAA 78 08/01/2018   GFRNONAA 67 08/01/2018     Hepatic Lab Results  Component Value Date   AST 20 03/02/2020   ALT 18 03/02/2020   ALBUMIN 4.1 03/02/2020   ALKPHOS 63 03/02/2020     Electrolytes Lab Results  Component Value Date   NA 140 08/01/2018   K 3.9 08/01/2018   CL 103 08/01/2018   CALCIUM 10.0 08/01/2018     Bone No results found for:  VD25OH, [id number], OB0962EZ6, OQ9476LY6, 25OHVITD1, 25OHVITD2, 25OHVITD3, TESTOFREE, TESTOSTERONE   Inflammation (CRP: Acute Phase) (ESR: Chronic Phase) No results found for: CRP, ESRSEDRATE, LATICACIDVEN     Note: Above Lab results reviewed.  Recent Imaging Review  DG BONE DENSITY (DXA) EXAM: DUAL X-RAY ABSORPTIOMETRY (DXA) FOR BONE MINERAL DENSITY  IMPRESSION: Your patient Clydine Parkison completed a BMD test on 05/12/2020 using the [place] (software version: 14.10) manufactured by [employer]. The following summarizes the results of our evaluation. Technologist: SCE PATIENT BIOGRAPHICAL: Name: Banessa, Mao Patient ID: [id number] Birth Date: 1931-04-01 Height: 61.5 in. Gender: Female Exam Date: 05/12/2020 Weight: 163.2 lbs. Indications: Advanced Age, Height Loss, History  of Fracture (Adult), History of Spinal Surgery, Hysterectomy, Postmenopausal, Rheumatoid Arthritis Fractures: Left wrist Treatments: calcium w/ vit D, Omeprazole DENSITOMETRY RESULTS: Site          Region      Measured Date Measured Age WHO Classification Young Adult T-score BMD         %Change vs. Previous Significant Change (*) DualFemur Total Right 05/12/2020 86.8 Osteoporosis -3.6 0.549 g/cm2 - -  Right Forearm Radius 33% 05/12/2020 86.8 Osteoporosis -4.1 0.518 g/cm2 - - ASSESSMENT:  The BMD measured at Forearm Radius 33% is 0.518 g/cm2 with a T-score of -4.1. This patient is considered osteoporotic according to [facility]) criteria. The scan quality is good. Lumbar spine was not utilized due to (advanced degenerative changes/scoliosis.  World Pharmacologist [facility]) criteria for post-menopausal, Caucasian Women: Normal:                   T-score at or above -1 SD Osteopenia/low bone mass: T-score between -1 and -2.5 SD Osteoporosis:             T-score at or below -2.5 SD  RECOMMENDATIONS:  1. All patients should optimize calcium and vitamin D intake. 2. Consider FDA-approved medical therapies in postmenopausal women and men aged 72 years and older, based on the following: a. A hip or vertebral(clinical or morphometric) fracture b. T-score < -2.5 at the femoral neck or spine after appropriate evaluation to exclude secondary causes c. Low bone mass (T-score between -1.0 and -2.5 at the femoral neck or spine) and a 10-year probability of a hip fracture > 3% or a 10-year probability of a major osteoporosis-related fracture > 20% based on the [place]-adapted WHO algorithm 3. Clinician judgment and/or patient preferences may indicate treatment for people with 10-year fracture probabilities above or below these levels  FOLLOW-UP:  People with diagnosed cases of osteoporosis or at high risk for fracture should have regular bone mineral density tests.  For patients eligible for Medicare, routine testing is allowed once every 2 years. The testing frequency can be increased to one year for patients who have rapidly progressing disease, those who are receiving or discontinuing medical therapy to restore bone mass, or have additional risk factors.  I have reviewed this report, and agree with the above findings.  [facility]  Electronically Signed   By: Rolm Baptise M.D.   On: 05/12/2020 12:57 Note: Reviewed        Physical Exam  General appearance: Well nourished, well developed, and well hydrated. In no apparent acute distress Mental status: Alert, oriented x 3 (person, place, & time)       Respiratory: No evidence of acute respiratory distress Eyes: PERLA Vitals: BP (!) 156/65 (BP Location: Right Arm, Patient Position: Sitting, Cuff Size: Normal)   Pulse 63  Temp (!) 97.3 F (36.3 C) (Temporal)   Resp 16   Ht 5' 1"  (1.549 m)   Wt 161 lb (73 kg)   SpO2 97%   BMI 30.42 kg/m  BMI: Estimated body mass index is 30.42 kg/m as calculated from the following:   Height as of this encounter: 5' 1"  (1.549 m).   Weight as of this encounter: 161 lb (73 kg). Ideal: Ideal body weight: 47.8 kg (105 lb 6.1 oz) Adjusted ideal body weight: 57.9 kg (127 lb 10 oz)  Patient presents today in wheelchair.  Low back pain, bilateral knee pain.  Assessment   Status Diagnosis  Controlled Controlled Controlled 1. Lumbar facet arthropathy   2. Lumbar degenerative disc disease   3. Primary osteoarthritis of right knee   4. Chronic pain syndrome      Plan of Care  Jocelyn [name] has a current medication list which includes the following long-term medication(s): atorvastatin, calcium citrate-vitamin d, carvedilol, ferrous sulfate, isosorbide mononitrate, and olmesartan-amlodipine-hctz.  Pharmacotherapy (Medications Ordered): Meds ordered this encounter  Medications   HYDROcodone-acetaminophen (NORCO/VICODIN) 5-325 MG  tablet    Sig: Take 1 tablet by mouth every 12 (twelve) hours as needed for severe pain. Must last 30 days.    Dispense:  60 tablet    Refill:  0    Chronic Pain: STOP Act (Not applicable) Fill 1 day early if closed on refill date. Avoid benzodiazepines within 8 hours of opioids   HYDROcodone-acetaminophen (NORCO/VICODIN) 5-325 MG tablet    Sig: Take 1 tablet by mouth every 12 (twelve) hours as needed for severe pain. Must last 30 days.    Dispense:  60 tablet    Refill:  0    Chronic Pain: STOP Act (Not applicable) Fill 1 day early if closed on refill date. Avoid benzodiazepines within 8 hours of opioids    Follow-up plan:   Return in about 8 weeks (around 11/17/2020) for Medication Management, in person.   Recent Visits Date Type Provider Dept  09/16/20 Telemedicine Gillis Santa, MD [facility]  08/25/20 Telemedicine Gillis Santa, MD [facility]  07/23/20 Office Visit Gillis Santa, MD [facility]  Showing recent visits within past 90 days and meeting all other requirements Today's Visits Date Type Provider Dept  09/22/20 Office Visit Gillis Santa, MD [facility]  Showing today's visits and meeting all other requirements Future Appointments Date Type Provider Dept  11/03/20 Appointment Gillis Santa, MD [facility]  Showing future appointments within next 90 days and meeting all other requirements I discussed the assessment and treatment plan with the patient. The patient was provided an opportunity to ask questions and all were answered. The patient agreed with the plan and demonstrated an understanding of the instructions.  Patient advised to call back or seek an in-person evaluation if the symptoms or condition worsens.  Duration of encounter: 30 minutes.  Note by: Gillis Santa, MD Date: 09/22/2020; Time: 1:11 PM

## 2020-09-22 NOTE — Progress Notes (Signed)
Safety precautions to be maintained throughout the outpatient stay will include: orient to surroundings, keep bed in low position, maintain call bell within reach at all times, provide assistance with transfer out of bed and ambulation.  

## 2020-09-24 NOTE — Telephone Encounter (Signed)
error 

## 2020-11-03 ENCOUNTER — Encounter: Payer: Medicare Other | Admitting: Student in an Organized Health Care Education/Training Program

## 2020-11-24 ENCOUNTER — Other Ambulatory Visit: Payer: Self-pay

## 2020-11-24 ENCOUNTER — Ambulatory Visit
Payer: Medicare Other | Attending: Student in an Organized Health Care Education/Training Program | Admitting: Student in an Organized Health Care Education/Training Program

## 2020-11-24 ENCOUNTER — Encounter: Payer: Self-pay | Admitting: Student in an Organized Health Care Education/Training Program

## 2020-11-24 VITALS — BP 157/63 | HR 60 | Temp 97.2°F | Resp 16 | Wt 161.0 lb

## 2020-11-24 DIAGNOSIS — M170 Bilateral primary osteoarthritis of knee: Secondary | ICD-10-CM | POA: Diagnosis not present

## 2020-11-24 DIAGNOSIS — G894 Chronic pain syndrome: Secondary | ICD-10-CM | POA: Insufficient documentation

## 2020-11-24 DIAGNOSIS — M47818 Spondylosis without myelopathy or radiculopathy, sacral and sacrococcygeal region: Secondary | ICD-10-CM | POA: Insufficient documentation

## 2020-11-24 DIAGNOSIS — M5136 Other intervertebral disc degeneration, lumbar region: Secondary | ICD-10-CM | POA: Insufficient documentation

## 2020-11-24 DIAGNOSIS — M1711 Unilateral primary osteoarthritis, right knee: Secondary | ICD-10-CM | POA: Insufficient documentation

## 2020-11-24 DIAGNOSIS — M47816 Spondylosis without myelopathy or radiculopathy, lumbar region: Secondary | ICD-10-CM | POA: Insufficient documentation

## 2020-11-24 MED ORDER — HYDROCODONE-ACETAMINOPHEN 5-325 MG PO TABS: 1.0000 | ORAL_TABLET | Freq: Two times a day (BID) | ORAL | 0 refills | Status: AC | PRN

## 2020-11-24 MED ORDER — HYDROCODONE-ACETAMINOPHEN 5-325 MG PO TABS: 1.0000 | ORAL_TABLET | Freq: Two times a day (BID) | ORAL | 0 refills | Status: DC | PRN

## 2020-11-24 NOTE — Progress Notes (Signed)
PROVIDER NOTE: Information contained herein reflects review and annotations entered in association with encounter. Interpretation of such information and data should be left to medically-trained personnel. Information provided to patient can be located elsewhere in the medical record under "Patient Instructions". Document created using STT-dictation technology, any transcriptional errors that may result from process are unintentional.    Patient: Jocelyn [name]  Service Category: E/M  Provider: Gillis Santa, MD  DOB: 09-Apr-1931  DOS: 11/24/2020  Specialty: Interventional Pain Management  MRN: [id number]  Setting: Ambulatory outpatient  PCP: Crissie Figures, PA-C  Type: Established Patient    Referring Provider: Crissie Figures, PA-C  Location: Office  Delivery: Face-to-face     HPI  Jocelyn [name], a 87 y.o. year old female, is here today because of her Lumbar facet arthropathy [M47.816]. [name] primary complain today is Knee Pain (Bilateral ), Back Pain (Bilateral midline lumbar ), and Hand Pain (Bilateral ) Last encounter: My last encounter with her was on 09/16/2020. Pertinent problems: [name] has Lumbar degenerative disc disease; Hearing loss; Cerebrovascular accident (CVA) ([place]); Controlled substance agreement signed; Bilateral primary osteoarthritis of knee; Carpal tunnel syndrome on right; SI joint arthritis; Chronic pain of both knees; and Chronic pain syndrome on their pertinent problem list. Pain Assessment: Severity of Chronic pain is reported as a 6 /10. Location: Back (see visit info) Lower, Left, Right/denies. Onset: More than a month ago. Quality: Discomfort, Constant, Aching, Sharp, Throbbing, Tingling, Numbness. Timing: Constant. Modifying factor(s): rest. Vitals:  weight is 161 lb (73 kg). Her temporal temperature is 97.2 F (36.2 C) (abnormal). Her blood pressure is 157/63 (abnormal) and her pulse is 60. Her respiration is 16 and oxygen saturation is 99%.   Reason  for encounter: medication management.    No change in medical history since last visit.  Patient's pain is at baseline.  Patient continues multimodal pain regimen as prescribed.  States that it provides pain relief and improvement in functional status.    Pharmacotherapy Assessment  Analgesic: Hydrocodone 5 mg BID prn #60/month   Monitoring: [place] PMP: PDMP reviewed during this encounter.       Pharmacotherapy: No side-effects or adverse reactions reported. Compliance: No problems identified. Effectiveness: Clinically acceptable.  UDS:  Summary  Date Value Ref Range Status  11/21/2019 Note  Final    Comment:    ==================================================================== [employer] [facility] (MW) ==================================================================== Test                             Result       Flag       Units  Drug Present and Declared for Prescription Verification   Hydrocodone                    871          EXPECTED   ng/mg creat   Hydromorphone                  285          EXPECTED   ng/mg creat   Dihydrocodeine                 113          EXPECTED   ng/mg creat   Norhydrocodone                 1030         EXPECTED   ng/mg creat  Sources of hydrocodone include scheduled prescription medications.    Hydromorphone, dihydrocodeine and norhydrocodone are expected    metabolites of hydrocodone. Hydromorphone and dihydrocodeine are    also available as scheduled prescription medications.  ==================================================================== Test                      Result    Flag   Units      Ref Range   Creatinine              96               mg/dL      >=20 ==================================================================== Declared Medications:  The flagging and interpretation on this report are based on the  following declared medications.  Unexpected results may arise from  inaccuracies in the declared medications.   **Note:  The testing scope of this panel includes these medications:   Hydrocodone   **Note: The testing scope of this panel does not include the  following reported medications:   Acetaminophen  Albuterol  Amlodipine  Aspirin  Calcium  Clonidine (Catapres)  Diclofenac (Voltaren)  Famotidine  Hydrochlorothiazide  Iron  Isosorbide (Imdur)  Olmesartan  Omeprazole  Pravastatin  Vitamin D ==================================================================== For clinical consultation, please call [phone]. ====================================================================       ROS  Constitutional: Denies any fever or chills Gastrointestinal: No reported hemesis, hematochezia, vomiting, or acute GI distress Musculoskeletal:  Low back, bilateral knee pain Neurological: No reported episodes of acute onset apraxia, aphasia, dysarthria, agnosia, amnesia, paralysis, loss of coordination, or loss of consciousness  Medication Review  HYDROcodone-acetaminophen, Olmesartan-amLODIPine-HCTZ, albuterol, alendronate, aspirin EC, atorvastatin, calcium citrate-vitamin D, carvedilol, diclofenac Sodium, ferrous sulfate, isosorbide mononitrate, and omeprazole  History Review  Allergy: Jocelyn [name] has No Known Allergies. Drug: Jocelyn [name]  reports no history of drug use. Alcohol:  reports no history of alcohol use. Tobacco:  reports that she has never smoked. She has never used smokeless tobacco. Social: Jocelyn [name]  reports that she has never smoked. She has never used smokeless tobacco. She reports that she does not drink alcohol and does not use drugs. Medical:  has a past medical history of Arthritis of both knees (08/01/2015), Colitis, Essential hypertension, benign (07/28/2014), GERD (gastroesophageal reflux disease), iron deficiency anemia (09/02/2015), Hyperlipidemia, Hypertension, Lumbar pain, Medicare annual wellness visit, subsequent (04/07/2017), OSA on CPAP (08/01/2015), Overweight (BMI 25.0-29.9)  (07/10/2013), Prediabetes (09/02/2015), and SOB (shortness of breath) (07/10/2013). Surgical: Jocelyn [name]  has a past surgical history that includes Cataract extraction; Abdominal hysterectomy; Cholecystectomy; Lumbar disc surgery; and Colonoscopy with propofol (N/A, 04/09/2018). Family: family history includes Anuerysm in her son; Cancer in her daughter and father; Diabetes in her daughter and mother; Heart disease in her mother; Hypertension in her daughter.  Laboratory Chemistry Profile   Renal Lab Results  Component Value Date   BUN 14 08/01/2018   CREATININE 0.80 56/31/4970   BCR NOT APPLICABLE 26/37/8588   GFRAA 78 08/01/2018   GFRNONAA 67 08/01/2018     Hepatic Lab Results  Component Value Date   AST 20 03/02/2020   ALT 18 03/02/2020   ALBUMIN 4.1 03/02/2020   ALKPHOS 63 03/02/2020     Electrolytes Lab Results  Component Value Date   NA 140 08/01/2018   K 3.9 08/01/2018   CL 103 08/01/2018   CALCIUM 10.0 08/01/2018     Bone No results found for: VD25OH, VD125OH2TOT, [id number], [id number], 25OHVITD1, 25OHVITD2, 25OHVITD3, TESTOFREE, TESTOSTERONE   Inflammation (CRP: Acute Phase) (ESR: Chronic  Phase) No results found for: CRP, ESRSEDRATE, LATICACIDVEN     Note: Above Lab results reviewed.  Recent Imaging Review  DG BONE DENSITY (DXA) EXAM: DUAL X-RAY ABSORPTIOMETRY (DXA) FOR BONE MINERAL DENSITY  IMPRESSION: Your patient Jocelyn [name] completed a BMD test on 05/12/2020 using the [place] (software version: 14.10) manufactured by [employer]. The following summarizes the results of our evaluation. Technologist: SCE PATIENT BIOGRAPHICAL: Name: Jocelyn [name], Jocelyn [name] Patient ID: [id number] Birth Date: 18-Oct-1931 Height: 61.5 in. Gender: Female Exam Date: 05/12/2020 Weight: 163.2 lbs. Indications: Advanced Age, Height Loss, History of Fracture (Adult), History of Spinal Surgery, Hysterectomy, Postmenopausal, Rheumatoid Arthritis Fractures: Left  wrist Treatments: calcium w/ vit D, Omeprazole DENSITOMETRY RESULTS: Site          Region      Measured Date Measured Age WHO Classification Young Adult T-score BMD         %Change vs. Previous Significant Change (*) DualFemur Total Right 05/12/2020 86.8 Osteoporosis -3.6 0.549 g/cm2 - -  Right Forearm Radius 33% 05/12/2020 86.8 Osteoporosis -4.1 0.518 g/cm2 - - ASSESSMENT:  The BMD measured at Forearm Radius 33% is 0.518 g/cm2 with a T-score of -4.1. This patient is considered osteoporotic according to [facility]) criteria. The scan quality is good. Lumbar spine was not utilized due to (advanced degenerative changes/scoliosis.  World Pharmacologist [facility]) criteria for post-menopausal, Caucasian Women: Normal:                   T-score at or above -1 SD Osteopenia/low bone mass: T-score between -1 and -2.5 SD Osteoporosis:             T-score at or below -2.5 SD  RECOMMENDATIONS:  1. All patients should optimize calcium and vitamin D intake. 2. Consider FDA-approved medical therapies in postmenopausal women and men aged 64 years and older, based on the following: a. A hip or vertebral(clinical or morphometric) fracture b. T-score < -2.5 at the femoral neck or spine after appropriate evaluation to exclude secondary causes c. Low bone mass (T-score between -1.0 and -2.5 at the femoral neck or spine) and a 10-year probability of a hip fracture > 3% or a 10-year probability of a major osteoporosis-related fracture > 20% based on the [place]-adapted WHO algorithm 3. Clinician judgment and/or patient preferences may indicate treatment for people with 10-year fracture probabilities above or below these levels  FOLLOW-UP:  People with diagnosed cases of osteoporosis or at high risk for fracture should have regular bone mineral density tests. For patients eligible for Medicare, routine testing is allowed once every 2 years. The testing frequency can be  increased to one year for patients who have rapidly progressing disease, those who are receiving or discontinuing medical therapy to restore bone mass, or have additional risk factors.  I have reviewed this report, and agree with the above findings.  [facility]  Electronically Signed   By: Rolm Baptise M.D.   On: 05/12/2020 12:57 Note: Reviewed        Physical Exam  General appearance: Well nourished, well developed, and well hydrated. In no apparent acute distress Mental status: Alert, oriented x 3 (person, place, & time)       Respiratory: No evidence of acute respiratory distress Eyes: PERLA Vitals: BP (!) 157/63 (BP Location: Left Arm, Patient Position: Sitting, Cuff Size: Normal)   Pulse 60   Temp (!) 97.2 F (36.2 C) (Temporal)   Resp 16   Wt 161  lb (73 kg)   SpO2 99%   BMI 30.42 kg/m  BMI: Estimated body mass index is 30.42 kg/m as calculated from the following:   Height as of 09/22/20: _0  (1.549 m).   Weight as of this encounter: 161 lb (73 kg). Ideal: Ideal body weight: 47.8 kg (105 lb 6.1 oz) Adjusted ideal body weight: 57.9 kg (127 lb 10 oz)  Patient presents today in wheelchair.  Low back pain, bilateral knee pain.  Assessment   Status Diagnosis  Controlled Controlled Controlled 1. Lumbar facet arthropathy   2. Bilateral primary osteoarthritis of knee   3. Lumbar degenerative disc disease   4. Primary osteoarthritis of right knee   5. SI joint arthritis   6. Chronic pain syndrome       Plan of Care  Jocelyn [name] has a current medication list which includes the following long-term medication(s): atorvastatin, calcium citrate-vitamin d, carvedilol, ferrous sulfate, isosorbide mononitrate, and olmesartan-amlodipine-hctz.  Pharmacotherapy (Medications Ordered): Meds ordered this encounter  Medications   HYDROcodone-acetaminophen (NORCO/VICODIN) 5-325 MG tablet    Sig: Take 1 tablet by mouth every 12 (twelve) hours as needed  for severe pain. Must last 30 days.    Dispense:  60 tablet    Refill:  0    Chronic Pain: STOP Act (Not applicable) Fill 1 day early if closed on refill date. Avoid benzodiazepines within 8 hours of opioids   HYDROcodone-acetaminophen (NORCO/VICODIN) 5-325 MG tablet    Sig: Take 1 tablet by mouth every 12 (twelve) hours as needed for severe pain. Must last 30 days.    Dispense:  60 tablet    Refill:  0    Chronic Pain: STOP Act (Not applicable) Fill 1 day early if closed on refill date. Avoid benzodiazepines within 8 hours of opioids   HYDROcodone-acetaminophen (NORCO/VICODIN) 5-325 MG tablet    Sig: Take 1 tablet by mouth every 12 (twelve) hours as needed for severe pain. Must last 30 days.    Dispense:  60 tablet    Refill:  0    Chronic Pain: STOP Act (Not applicable) Fill 1 day early if closed on refill date. Avoid benzodiazepines within 8 hours of opioids    B/L monovisc injection for knee pain related to knee OA Orders Placed This Encounter  Procedures   KNEE INJECTION    Indications: Knee arthralgia (pain) due to osteoarthritis (OA) Imaging: None ([id number]) Nursing: Please request pharmacy to have Monovisc (Hyaluronan) available in office.    Standing Status:   Future    Standing Expiration Date:   12/25/2020    Scheduling Instructions:     Procedure: Knee injection Monovisc (Hyaluronan)     Treatment No.:  1          Level: Intra-articular     Laterality: Bilateral Knee     Sedation: Patient's choice.     Timeframe: in two (2) weeks    Order Specific Question:   Where will this procedure be performed?    Answer:   [facility]      Follow-up plan:   Return in about 3 months (around 02/25/2021) for Medication Management, in person.   Recent Visits Date Type Provider Dept  09/22/20 Office Visit Gillis Santa, MD [facility]  09/16/20 Telemedicine Gillis Santa, MD [facility]  Showing recent visits within past 90 days and meeting all  other requirements Today's Visits Date Type Provider Dept  11/24/20 Office Visit Gillis Santa, MD [facility]  Showing today's visits and meeting all other requirements Future Appointments Date Type Provider Dept  02/18/21 Appointment Gillis Santa, MD [facility]  Showing future appointments within next 90 days and meeting all other requirements I discussed the assessment and treatment plan with the patient. The patient was provided an opportunity to ask questions and all were answered. The patient agreed with the plan and demonstrated an understanding of the instructions.  Patient advised to call back or seek an in-person evaluation if the symptoms or condition worsens.  Duration of encounter: 30 minutes.  Note by: Gillis Santa, MD Date: 11/24/2020; Time: 1:02 PM

## 2020-11-24 NOTE — Patient Instructions (Signed)
____________________________________________________________________________________________  General Risks and Possible Complications  Patient Responsibilities: It is important that you read this as it is part of your informed consent. It is our duty to inform you of the risks and possible complications associated with treatments offered to you. It is your responsibility as a patient to read this and to ask questions about anything that is not clear or that you believe was not covered in this document.  Patient's Rights: You have the right to refuse treatment. You also have the right to change your mind, even after initially having agreed to have the treatment done. However, under this last option, if you wait until the last second to change your mind, you may be charged for the materials used up to that point.  Introduction: Medicine is not an exact science. Everything in Medicine, including the lack of treatment(s), carries the potential for danger, harm, or loss (which is by definition: Risk). In Medicine, a complication is a secondary problem, condition, or disease that can aggravate an already existing one. All treatments carry the risk of possible complications. The fact that a side effects or complications occurs, does not imply that the treatment was conducted incorrectly. It must be clearly understood that these can happen even when everything is done following the highest safety standards.  No treatment: You can choose not to proceed with the proposed treatment alternative. The "PRO(s)" would include: avoiding the risk of complications associated with the therapy. The "CON(s)" would include: not getting any of the treatment benefits. These benefits fall under one of three categories: diagnostic; therapeutic; and/or palliative. Diagnostic benefits include: getting information which can ultimately lead to improvement of the disease or symptom(s). Therapeutic benefits are those associated with the  successful treatment of the disease. Finally, palliative benefits are those related to the decrease of the primary symptoms, without necessarily curing the condition (example: decreasing the pain from a flare-up of a chronic condition, such as incurable terminal cancer).  General Risks and Complications: These are associated to most interventional treatments. They can occur alone, or in combination. They fall under one of the following six (6) categories: no benefit or worsening of symptoms; bleeding; infection; nerve damage; allergic reactions; and/or death. No benefits or worsening of symptoms: In Medicine there are no guarantees, only probabilities. No healthcare provider can ever guarantee that a medical treatment will work, they can only state the probability that it may. Furthermore, there is always the possibility that the condition may worsen, either directly, or indirectly, as a consequence of the treatment. Bleeding: This is more common if the patient is taking a blood thinner, either prescription or over the counter (example: Goody Powders, Fish oil, Aspirin, Garlic, etc.), or if suffering a condition associated with impaired coagulation (example: Hemophilia, cirrhosis of the liver, low platelet counts, etc.). However, even if you do not have one on these, it can still happen. If you have any of these conditions, or take one of these drugs, make sure to notify your treating physician. Infection: This is more common in patients with a compromised immune system, either due to disease (example: diabetes, cancer, human immunodeficiency virus [HIV], etc.), or due to medications or treatments (example: therapies used to treat cancer and rheumatological diseases). However, even if you do not have one on these, it can still happen. If you have any of these conditions, or take one of these drugs, make sure to notify your treating physician. Nerve Damage: This is more common when the treatment is an invasive    one, but it can also happen with the use of medications, such as those used in the treatment of cancer. The damage can occur to small secondary nerves, or to large primary ones, such as those in the spinal cord and brain. This damage may be temporary or permanent and it may lead to impairments that can range from temporary numbness to permanent paralysis and/or brain death. Allergic Reactions: Any time a substance or material comes in contact with our body, there is the possibility of an allergic reaction. These can range from a mild skin rash (contact dermatitis) to a severe systemic reaction (anaphylactic reaction), which can result in death. Death: In general, any medical intervention can result in death, most of the time due to an unforeseen complication. ____________________________________________________________________________________________ Knee Injection A knee injection is a procedure to get medicine into your knee joint to relieve the pain, swelling, and stiffness of arthritis. Your health care provider uses a needle to inject medicine, which may also help to lubricate and cushion your knee joint. You may need more than one injection. Tell a health care provider about: Any allergies you have. All medicines you are taking, including vitamins, herbs, eye drops, creams, and over-the-counter medicines. Any problems you or family members have had with anesthetic medicines. Any blood disorders you have. Any surgeries you have had. Any medical conditions you have. Whether you are pregnant or may be pregnant. What are the risks? Generally, this is a safe procedure. However, problems may occur, including: Infection. Bleeding. Symptoms that get worse. Damage to the area around your knee. Allergic reaction to any of the medicines. Skin reactions from repeated injections. What happens before the procedure? Ask your health care provider about: Changing or stopping your regular medicines. This  is especially important if you are taking diabetes medicines or blood thinners. Taking medicines such as aspirin and ibuprofen. These medicines can thin your blood. Do not take these medicines unless your health care provider tells you to take them. Taking over-the-counter medicines, vitamins, herbs, and supplements. Plan to have a responsible adult take you home from the hospital or clinic. What happens during the procedure?  You will sit or lie down in a position for your knee to be treated. The skin over your kneecap will be cleaned with a germ-killing soap. You will be given a medicine that numbs the area (local anesthetic). You may feel some stinging. The medicine will be injected into your knee. The needle is carefully placed between your kneecap and your knee. The medicine is injected into the joint space. The needle will be removed at the end of the procedure. A bandage (dressing) may be placed over the injection site. The procedure may vary among health care providers and hospitals. What can I expect after the procedure? Your blood pressure, heart rate, breathing rate, and blood oxygen level will be monitored until you leave the hospital or clinic. You may have to move your knee through its full range of motion. This helps to get all the medicine into your joint space. You will be watched to make sure that you do not have a reaction to the injected medicine. You may feel more pain, swelling, and warmth than you did before the injection. This reaction may last about 1-2 days. Follow these instructions at home: Medicines Take over-the-counter and prescription medicines only as told by your health care provider. Ask your health care provider if the medicine prescribed to you requires you to avoid driving or using machinery. Do not  take medicines such as aspirin and ibuprofen unless your health care provider tells you to take them. Injection site care Follow instructions from your health  care provider about: How to take care of your puncture site. When and how you should change your dressing. When you should remove your dressing. Check your injection area every day for signs of infection. Check for: More redness, swelling, or pain after 2 days. Fluid or blood. Pus or a bad smell. Warmth. Managing pain, stiffness, and swelling  If directed, put ice on the injection area. To do this: Put ice in a plastic bag. Place a towel between your skin and the bag. Leave the ice on for 20 minutes, 2-3 times per day. Remove the ice if your skin turns bright red. This is very important. If you cannot feel pain, heat, or cold, you have a greater risk of damage to the area. Do not apply heat to your knee. Raise (elevate) the injection area above the level of your heart while you are sitting or lying down. General instructions If you were given a dressing, keep it dry until your health care provider says it can be removed. Ask your health care provider when you can start showering or bathing. Avoid strenuous activities for as long as directed by your health care provider. Ask your health care provider when you can return to your normal activities. Keep all follow-up visits. This is important. You may need more injections. Contact a health care provider if you have: A fever. Warmth in your injection area. Fluid, blood, or pus coming from your injection site. Symptoms at your injection site that last longer than 2 days after your procedure. Get help right away if: Your knee turns very red. Your knee becomes very swollen. Your knee is in severe pain. Summary A knee injection is a procedure to get medicine into your knee joint to relieve the pain, swelling, and stiffness of arthritis. A needle is carefully placed between your kneecap and your knee to inject medicine into the joint space. Before the procedure, ask your health care provider about changing or stopping your regular medicines,  especially if you are taking diabetes medicines or blood thinners. Contact your health care provider if you have any problems or questions after your procedure. This information is not intended to replace advice given to you by your health care provider. Make sure you discuss any questions you have with your health care provider. Document Revised: 07/03/2019 Document Reviewed: 07/03/2019 [employer]vier Patient Education  2022 [employer].

## 2020-11-24 NOTE — Progress Notes (Signed)
Nursing Pain Medication Assessment:  Safety precautions to be maintained throughout the outpatient stay will include: orient to surroundings, keep bed in low position, maintain call bell within reach at all times, provide assistance with transfer out of bed and ambulation.  Medication Inspection Compliance: Jocelyn [name] did not comply with our request to bring her pills to be counted. She was reminded that bringing the medication bottles, even when empty, is a requirement.  Medication: None brought in. Pill/Patch Count: None available to be counted. Bottle Appearance: No container available. Did not bring bottle(s) to appointment. Filled Date: N/A Last Medication intake:  Today

## 2020-11-25 ENCOUNTER — Telehealth: Payer: Self-pay

## 2020-11-25 NOTE — Telephone Encounter (Signed)
I spoke to Jocelyn Lamer, our pharmacy doesn't carry those drugs. Maybe change to steroids?

## 2020-11-25 NOTE — Telephone Encounter (Signed)
Her insurance company will not approve Monovisc. The preferred drugs are Duralone, Gelsen 3 or Synvisc. They want to know if you could use any of these instead for the knee injections

## 2020-12-07 ENCOUNTER — Ambulatory Visit
Payer: Medicare Other | Attending: Student in an Organized Health Care Education/Training Program | Admitting: Student in an Organized Health Care Education/Training Program

## 2020-12-07 ENCOUNTER — Encounter: Payer: Self-pay | Admitting: Student in an Organized Health Care Education/Training Program

## 2020-12-07 ENCOUNTER — Other Ambulatory Visit: Payer: Self-pay

## 2020-12-07 DIAGNOSIS — M170 Bilateral primary osteoarthritis of knee: Secondary | ICD-10-CM | POA: Insufficient documentation

## 2020-12-07 MED ORDER — DEXAMETHASONE SODIUM PHOSPHATE 10 MG/ML IJ SOLN
INTRAMUSCULAR | Status: AC
  Filled 2020-12-07: qty 2

## 2020-12-07 MED ORDER — ROPIVACAINE HCL 2 MG/ML IJ SOLN: 9.0000 mL | Freq: Once | INTRAMUSCULAR | Status: DC

## 2020-12-07 MED ORDER — METHYLPREDNISOLONE ACETATE 80 MG/ML IJ SUSP
INTRAMUSCULAR | Status: AC
  Filled 2020-12-07: qty 1

## 2020-12-07 MED ORDER — METHYLPREDNISOLONE ACETATE 80 MG/ML IJ SUSP: 80.0000 mg | Freq: Once | INTRAMUSCULAR | Status: DC

## 2020-12-07 MED ORDER — ROPIVACAINE HCL 2 MG/ML IJ SOLN
INTRAMUSCULAR | Status: AC
  Filled 2020-12-07: qty 20

## 2020-12-07 NOTE — Patient Instructions (Signed)

## 2020-12-07 NOTE — Progress Notes (Signed)
Safety precautions to be maintained throughout the outpatient stay will include: orient to surroundings, keep bed in low position, maintain call bell within reach at all times, provide assistance with transfer out of bed and ambulation.  

## 2020-12-07 NOTE — Progress Notes (Signed)
PROVIDER NOTE: Interpretation of information contained herein should be left to medically-trained personnel. Specific patient instructions are provided elsewhere under "Patient Instructions" section of medical record. This document was created in part using STT-dictation technology, any transcriptional errors that may result from this process are unintentional.  Patient: Jocelyn [name] Service: Procedure Provider: Gillis Santa, MD Type: Established DOS: 12/07/2020 Specialty: Interventional PM DOB: 1931/07/29 Setting: Ambulatory Specialty designation: 09 MRN: [id number] Location: Ambulatory outpatient facility Location: Outpatient facility PCP: Crissie Figures, PA-C Delivery: Face-to-face Ref. Prov.: Gillis Santa, MD  Procedure [facility] Interventional Pain Management )    Procedure: Steroid Intra-articular Knee Injection  No.: n/a  Series: n/a Level/approach: Medial Laterality: Bilateral (-50) Imaging guidance: None required ([id number]) Analgesia: Local anesthesia Sedation: None.   Purpose: Diagnostic/Therapeutic Indications: Knee arthralgia associated to osteoarthritis of the knee  NAS-11 score:   Pre-procedure: 3 /10   Post-procedure: 3 /10     1. Bilateral primary osteoarthritis of knee    Pre-Procedure Preparation  Monitoring: As per clinic protocol.  Risk Assessment: Vitals:  [id number] body mass index is 30.42 kg/m as calculated from the following:   Height as of this encounter: 5\' 1"  (1.549 m).   Weight as of this encounter: 161 lb (73 kg)., Rate:(!) 59 , BP:(!) 149/55, Resp:16, Temp:(!) 97.2 F (36.2 C), SpO2:98 %  Allergies: She has No Known Allergies.  Precautions: None required  Blood-thinner(s): None at this time  Coagulopathies: Reviewed. None identified.   Active Infection(s): Reviewed. None identified. Jocelyn [name] is afebrile   Location setting: Exam room Position: Sitting w/ knee bent 90 degrees Safety Precautions: Patient was assessed for positional comfort  and pressure points before starting the procedure. Prepping solution: DuraPrep (Iodine Povacrylex [0.7% available iodine] and Isopropyl Alcohol, 74% w/w) Prep Area: Entire knee region Approach: percutaneous, just above the tibial plateau, medial to the infrapatellar tendon. Intended target: Intra-articular knee space Materials: Tray: Block Needle(s): Regular Qty: 1/side Length: 1.5-inch Gauge: 25G   Meds ordered this encounter  Medications   methylPREDNISolone acetate (DEPO-MEDROL) injection 80 mg   ropivacaine (PF) 2 mg/mL (0.2%) (NAROPIN) injection 9 mL    No orders of the defined types were placed in this encounter. 10 cc solution made of 9 cc of 0.2% Vivacaine, 1 cc of methylprednisolone, 80 mg/cc.  5 cc injected in each knee.   Time-out: 1156 I initiated and conducted the "Time-out" before starting the procedure, as per protocol. The patient was asked to participate by confirming the accuracy of the "Time Out" information. Verification of the correct person, site, and procedure were performed and confirmed by me, the nursing staff, and the patient. "Time-out" conducted as per Joint Commission's Universal Protocol (UP.01.01.01). Procedure checklist: Completed   H&P (Pre-op  Assessment)  Jocelyn [name] is a 87 y.o. (year old), female patient, seen today for interventional treatment. She  has a past surgical history that includes Cataract extraction; Abdominal hysterectomy; Cholecystectomy; Lumbar disc surgery; and Colonoscopy with propofol (N/A, 04/09/2018). Jocelyn [name] has a current medication list which includes the following prescription(s): albuterol, alendronate, aspirin ec, atorvastatin, calcium citrate-vitamin d, carvedilol, diclofenac sodium, ferrous sulfate, hydrocodone-acetaminophen, [START ON 12/24/2020] hydrocodone-acetaminophen, [START ON 01/23/2021] hydrocodone-acetaminophen, isosorbide mononitrate, olmesartan-amlodipine-hctz, and omeprazole, and the following Facility-Administered  Medications: methylprednisolone acetate and ropivacaine (pf) 2 mg/ml (0.2%). Her primarily concern today is the Knee Pain (Bilateral )  She has No Known Allergies.   Last encounter: My last encounter with her was on 11/24/2020. Pertinent problems: Jocelyn [name] has Lumbar degenerative disc disease; Hearing loss; Cerebrovascular  accident (CVA) ([place]); Controlled substance agreement signed; Bilateral primary osteoarthritis of knee; Carpal tunnel syndrome on right; SI joint arthritis; Chronic pain of both knees; and Chronic pain syndrome on their pertinent problem list. Pain Assessment: Severity of Chronic pain is reported as a 3 /10. Location: Knee Left, Right/denies. Onset: More than a month ago. Quality: Discomfort, Constant, Sore. Timing: Constant. Modifying factor(s): pain medications. Vitals:  height is 5\' 1"  (1.549 m) and weight is 161 lb (73 kg). Her temporal temperature is 97.2 F (36.2 C) (abnormal). Her blood pressure is 149/55 (abnormal) and her pulse is 59 (abnormal). Her respiration is 16 and oxygen saturation is 98%.   Reason for encounter: "interventional pain management therapy due pain of at least four (4) weeks in duration, with failure to respond and/or inability to tolerate more conservative care.   Related imaging: Knee-L MR wo contrast:  No results found for this or any previous visit.  Knee-R MR wo contrast:  No results found for this or any previous visit.  Knee-R DG 1-2 views:  Results for orders placed during the hospital encounter of 03/12/18  DG Knee 1-2 Views Right  Narrative CLINICAL DATA:  87 year old female with bilateral knee and back pain for years. No known injury. Initial encounter.  EXAM: RIGHT KNEE - 1-2 VIEW  COMPARISON:  None.  FINDINGS: Marked medial tibiofemoral joint and patellofemoral joint degenerative changes. Moderate lateral tibiofemoral joint degenerative changes. Possible chondrocalcinosis. Suspect loose bodies. Small to moderate-size  joint effusion. No fracture or dislocation. Vascular calcifications.  IMPRESSION: 1. Marked medial tibiofemoral joint and patellofemoral joint degenerative changes. Moderate lateral tibiofemoral joint degenerative changes. 2. Possible chondrocalcinosis. 3. Suspect loose bodies. 4. Small to moderate-size joint effusion.   Electronically Signed By: Genia Del M.D. On: 03/12/2018 16:01  Knee-L DG 1-2 views:  Results for orders placed during the hospital encounter of 03/12/18  DG Knee 1-2 Views Left  Addendum 03/12/2018  4:06 PM ADDENDUM REPORT: 03/12/2018 16:04  ADDENDUM: Possible chondrocalcinosis.  Suspect loose bodies.   Electronically Signed By: Genia Del M.D. On: 03/12/2018 16:04  Narrative CLINICAL DATA:  87 year old female with bilateral knee and back pain for years. No known injury. Initial encounter.  EXAM: LEFT KNEE - 1-2 VIEW  COMPARISON:  None.  FINDINGS: Marked medial tibiofemoral joint space and patellofemoral joint degenerative changes. Moderate lateral tibiofemoral joint degenerative changes.  Small to moderate-size joint effusion.  No fracture or dislocation.  Vascular calcifications.  IMPRESSION: 1. Marked medial tibiofemoral joint space and patellofemoral joint degenerative changes. Moderate lateral tibiofemoral joint degenerative changes. 2. Small to moderate-size joint effusion.  Electronically Signed: By: Genia Del M.D. On: 03/12/2018 15:58  Knee-R DG 3 views:  No results found for this or any previous visit.  Knee-L DG 3 views:  No results found for this or any previous visit.  Knee-R DG 4 views:  No results found for this or any previous visit.  Knee-L DG 4 views:  No results found for this or any previous visit.    Site Confirmation: Jocelyn [name] was asked to confirm the procedure and laterality before marking the site.  Consent: Before the procedure and under the influence of no sedative(s), amnesic(s), or  anxiolytics, the patient was informed of the treatment options, risks and possible complications. To fulfill our ethical and legal obligations, as recommended by the American Medical Association's Code of Ethics, I have informed the patient of my clinical impression; the nature and purpose of the treatment or procedure; the risks, benefits,  and possible complications of the intervention; the alternatives, including doing nothing; the risk(s) and benefit(s) of the alternative treatment(s) or procedure(s); and the risk(s) and benefit(s) of doing nothing. The patient was provided information about the general risks and possible complications associated with the procedure. These may include, but are not limited to: failure to achieve desired goals, infection, bleeding, organ or nerve damage, allergic reactions, paralysis, and death. In addition, the patient was informed of those risks and complications associated to Spine-related procedures, such as failure to decrease pain; infection (i.e.: Meningitis, epidural or intraspinal abscess); bleeding (i.e.: epidural hematoma, subarachnoid hemorrhage, or any other type of intraspinal or peri-dural bleeding); organ or nerve damage (i.e.: Any type of peripheral nerve, nerve root, or spinal cord injury) with subsequent damage to sensory, motor, and/or autonomic systems, resulting in permanent pain, numbness, and/or weakness of one or several areas of the body; allergic reactions; (i.e.: anaphylactic reaction); and/or death. Furthermore, the patient was informed of those risks and complications associated with the medications. These include, but are not limited to: allergic reactions (i.e.: anaphylactic or anaphylactoid reaction(s)); adrenal axis suppression; blood sugar elevation that in diabetics may result in ketoacidosis or comma; water retention that in patients with history of congestive heart failure may result in shortness of breath, pulmonary edema, and decompensation  with resultant heart failure; weight gain; swelling or edema; medication-induced neural toxicity; particulate matter embolism and blood vessel occlusion with resultant organ, and/or nervous system infarction; and/or aseptic necrosis of one or more joints. Finally, the patient was informed that Medicine is not an exact science; therefore, there is also the possibility of unforeseen or unpredictable risks and/or possible complications that may result in a catastrophic outcome. The patient indicated having understood very clearly. We have given the patient no guarantees and we have made no promises. Enough time was given to the patient to ask questions, all of which were answered to the patient's satisfaction. Jocelyn [name] has indicated that she wanted to continue with the procedure. Attestation: I, the ordering provider, attest that I have discussed with the patient the benefits, risks, side-effects, alternatives, likelihood of achieving goals, and potential problems during recovery for the procedure that I have provided informed consent.  Date  Time: 12/07/2020 11:44 AM    Description of procedure   Start Time: 1156 hrs  Local Anesthesia: Once the patient was positioned, prepped, and time-out was completed. The target area was identified located. The skin was marked with an approved surgical skin marker. Once marked, the skin (epidermis, dermis, and hypodermis), and deeper tissues (fat, connective tissue and muscle) were infiltrated with a small amount of a short-acting local anesthetic, loaded on a 10cc syringe with a 25G, 1.5-in  Needle. An appropriate amount of time was allowed for local anesthetics to take effect before proceeding to the next step. Local Anesthetic: Lidocaine 1-2% The unused portion of the local anesthetic was discarded in the proper designated containers. Safety Precautions: Aspiration looking for blood return was conducted prior to all injections. At no point did I inject any  substances, as a needle was being advanced. Before injecting, the patient was told to immediately notify me if she was experiencing any new onset of "ringing in the ears, or metallic taste in the mouth". No attempts were made at seeking any paresthesias. Safe injection practices and needle disposal techniques used. Medications properly checked for expiration dates. SDV (single dose vial) medications used. After the completion of the procedure, all disposable equipment used was discarded in the proper  designated Insurance risk surveyor.  Technical description: Protocol guidelines were followed. After positioning, the target area was identified and prepped in the usual manner. Skin & deeper tissues infiltrated with local anesthetic. Appropriate amount of time allowed to pass for local anesthetics to take effect. Proper needle placement secured. Once satisfactory needle placement was confirmed, I proceeded to inject the desired solution in slow, incremental fashion, intermittently assessing for discomfort or any signs of abnormal or undesired spread of substance. Once completed, the needle was removed and disposed of, as per hospital protocols. The area was cleaned, making sure to leave some of the prepping solution back to take advantage of its long term bactericidal properties.  Aspiration:  Negative   Vitals:   12/07/20 1146  BP: (!) 149/55  Pulse: (!) 59  Resp: 16  Temp: (!) 97.2 F (36.2 C)  TempSrc: Temporal  SpO2: 98%  Weight: 161 lb (73 kg)  Height: 5\' 1"  (1.549 m)    End Time: 1157 hrs    Post-op assessment  Post-procedure Vital Signs:  Pulse/HCG Rate: (!) 59  Temp: (!) 97.2 F (36.2 C) Resp: 16 BP: (!) 149/55 SpO2: 98 %  EBL: None  Complications: No immediate post-treatment complications observed by team, or reported by patient.  Note: The patient tolerated the entire procedure well. A repeat set of vitals were taken after the procedure and the patient was kept under  observation following institutional policy, for this type of procedure. Post-procedural neurological assessment was performed, showing return to baseline, prior to discharge. The patient was provided with post-procedure discharge instructions, including a section on how to identify potential problems. Should any problems arise concerning this procedure, the patient was given instructions to immediately contact us, at any time, without hesitation. In any case, we plan to contact the patient by telephone for a follow-up status report regarding this interventional procedure.  Comments:  No additional relevant information.   Plan of care  Chronic Opioid Analgesic:  Hydrocodone 5 mg BID prn #60/month   Follow up for MM as scheduled Consider repeating knee steroid injection depending on response  Follow-up plan:   Return for Keep sch. appt.      Recent Visits Date Type Provider Dept  11/24/20 Office Visit Gillis Santa, MD [facility]  09/22/20 Office Visit Gillis Santa, MD [facility]  09/16/20 Telemedicine Gillis Santa, MD [facility]  Showing recent visits within past 90 days and meeting all other requirements Today's Visits Date Type Provider Dept  12/07/20 Procedure visit Gillis Santa, MD [facility]  Showing today's visits and meeting all other requirements Future Appointments Date Type Provider Dept  02/18/21 Appointment Gillis Santa, MD [facility]  Showing future appointments within next 90 days and meeting all other requirements  Disposition: Discharge home  Discharge (Date  Time): 12/07/2020; 1200 hrs.   Primary Care Physician: Crissie Figures, PA-C Location: [facility] Note by: Gillis Santa, MD Date: 12/07/2020; Time: 1:11 PM  DISCLAIMER: Medicine is not an exact science. It has no guarantees or warranties. The decision to proceed with this intervention was based on the information collected  from the patient. Conclusions were drawn from the patient's questionnaire, interview, and examination. Because information was provided in large part by the patient, it cannot be guaranteed that it has not been purposely or unconsciously manipulated or altered. Every effort has been made to obtain as much accurate, relevant, available data as possible. Always take into account that the treatment will  also be dependent on availability of resources and existing treatment guidelines, considered by other Pain Management Specialists as being common knowledge and practice, at the time of the intervention. It is also important to point out that variation in procedural techniques and pharmacological choices are the acceptable norm. For Medico-Legal review purposes, the indications, contraindications, technique, and results of the these procedures should only be evaluated, judged and interpreted by a Board-Certified Interventional Pain Specialist with extensive familiarity and expertise in the same exact procedure and technique.

## 2020-12-08 ENCOUNTER — Telehealth: Payer: Self-pay | Admitting: *Deleted

## 2020-12-08 NOTE — Telephone Encounter (Signed)
LVM for patient to call if having any post procedure issues.

## 2020-12-09 ENCOUNTER — Ambulatory Visit: Payer: Medicare Other | Admitting: Student in an Organized Health Care Education/Training Program

## 2021-01-11 ENCOUNTER — Other Ambulatory Visit: Payer: Self-pay | Admitting: Internal Medicine

## 2021-02-18 ENCOUNTER — Encounter: Payer: Self-pay | Admitting: Student in an Organized Health Care Education/Training Program

## 2021-02-18 ENCOUNTER — Other Ambulatory Visit: Payer: Self-pay

## 2021-02-18 ENCOUNTER — Ambulatory Visit
Payer: Medicare Other | Attending: Student in an Organized Health Care Education/Training Program | Admitting: Student in an Organized Health Care Education/Training Program

## 2021-02-18 VITALS — BP 151/51 | HR 94 | Temp 97.3°F | Resp 15 | Ht 61.0 in | Wt 161.0 lb

## 2021-02-18 DIAGNOSIS — M47816 Spondylosis without myelopathy or radiculopathy, lumbar region: Secondary | ICD-10-CM | POA: Diagnosis not present

## 2021-02-18 DIAGNOSIS — M47818 Spondylosis without myelopathy or radiculopathy, sacral and sacrococcygeal region: Secondary | ICD-10-CM | POA: Diagnosis not present

## 2021-02-18 DIAGNOSIS — M170 Bilateral primary osteoarthritis of knee: Secondary | ICD-10-CM

## 2021-02-18 DIAGNOSIS — M5136 Other intervertebral disc degeneration, lumbar region: Secondary | ICD-10-CM | POA: Diagnosis present

## 2021-02-18 DIAGNOSIS — M51369 Other intervertebral disc degeneration, lumbar region without mention of lumbar back pain or lower extremity pain: Secondary | ICD-10-CM

## 2021-02-18 DIAGNOSIS — M461 Sacroiliitis, not elsewhere classified: Secondary | ICD-10-CM

## 2021-02-18 DIAGNOSIS — G894 Chronic pain syndrome: Secondary | ICD-10-CM | POA: Diagnosis present

## 2021-02-18 DIAGNOSIS — M1711 Unilateral primary osteoarthritis, right knee: Secondary | ICD-10-CM

## 2021-02-18 MED ORDER — HYDROCODONE-ACETAMINOPHEN 5-325 MG PO TABS: 1.0000 | ORAL_TABLET | Freq: Two times a day (BID) | ORAL | 0 refills | Status: DC | PRN

## 2021-02-18 NOTE — Progress Notes (Signed)
Nursing Pain Medication Assessment:  Safety precautions to be maintained throughout the outpatient stay will include: orient to surroundings, keep bed in low position, maintain call bell within reach at all times, provide assistance with transfer out of bed and ambulation.  Medication Inspection Compliance: Pill count conducted under aseptic conditions, in front of the patient. Neither the pills nor the bottle was removed from the patient's sight at any time. Once count was completed pills were immediately returned to the patient in their original bottle.  Medication: See above Pill/Patch Count:  8 of 60 pills remain Pill/Patch Appearance: Markings consistent with prescribed medication Bottle Appearance: Standard pharmacy container. Clearly labeled. Filled Date: 50 / 30 / 2022 Last Medication intake:  TodaySafety precautions to be maintained throughout the outpatient stay will include: orient to surroundings, keep bed in low position, maintain call bell within reach at all times, provide assistance with transfer out of bed and ambulation.

## 2021-02-18 NOTE — Progress Notes (Signed)
PROVIDER NOTE: Information contained herein reflects review and annotations entered in association with encounter. Interpretation of such information and data should be left to medically-trained personnel. Information provided to patient can be located elsewhere in the medical record under "Patient Instructions". Document created using STT-dictation technology, any transcriptional errors that may result from process are unintentional.    Patient: Jocelyn [name]  Service Category: E/M  Provider: Gillis Santa, MD  DOB: [birth year]  DOS: 02/18/2021  Specialty: Interventional Pain Management  MRN: [id number]  Setting: Ambulatory outpatient  PCP: Crissie Figures, PA-C  Type: Established Patient    Referring Provider: Crissie Figures, PA-C  Location: Office  Delivery: Face-to-face     HPI  Jocelyn [name], a 88 y.o. year old female, is here today because of her Bilateral primary osteoarthritis of knee [M17.0]. Jocelyn [name] primary complain today is Leg Pain  Last encounter: My last encounter with her was on 12/07/20  Pertinent problems: Jocelyn [name] has Lumbar degenerative disc disease; Hearing loss; Cerebrovascular accident (CVA) ([place]); Controlled substance agreement signed; Bilateral primary osteoarthritis of knee; Carpal tunnel syndrome on right; SI joint arthritis; Chronic pain of both knees; and Chronic pain syndrome on their pertinent problem list. Pain Assessment: Severity of Chronic pain is reported as a 7 /10. Location: Knee Left, Right/pain radiaities down both legs , left leg is the worse. Onset: More than a month ago. Quality: Aching, Burning, Throbbing, Discomfort. Timing: Constant. Modifying factor(s): meds, heat and rest. Vitals:  height is 5' 1"  (1.549 m) and weight is 161 lb (73 kg). Her temperature is 97.3 F (36.3 C) (abnormal). Her blood pressure is 151/51 (abnormal) and her pulse is 94. Her respiration is 15 and oxygen saturation is 100%.   Reason for encounter: both, medication  management and post-procedure assessment.     Post-procedure evaluation    Procedure: Steroid Intra-articular Knee Injection  No.: n/a   Series: n/a Level/approach: Medial Laterality: Bilateral (-50) Imaging guidance: None required ([id number]) Analgesia: Local anesthesia Sedation: None.   Purpose: Diagnostic/Therapeutic Indications: Knee arthralgia associated to osteoarthritis of the knee  NAS-11 score:   Pre-procedure: 3 /10   Post-procedure: 3 /10      Effectiveness:  Initial hour after procedure: 100 %  Subsequent 4-6 hours post-procedure: 100 %  Analgesia past initial 6 hours: 100 % (lasted about 1 month)  Ongoing improvement:  Analgesic:  50%, discussed repeating   Pharmacotherapy Assessment  Analgesic: Hydrocodone 5 mg BID prn #60/month   Monitoring: [place] PMP: PDMP reviewed during this encounter.       Pharmacotherapy: No side-effects or adverse reactions reported. Compliance: No problems identified. Effectiveness: Clinically acceptable.  UDS:  Summary  Date Value Ref Range Status  11/21/2019 Note  Final    Comment:    ==================================================================== [employer] Select 13 (MW) ==================================================================== Test                             Result       Flag       Units  Drug Present and Declared for Prescription Verification   Hydrocodone                    871          EXPECTED   ng/mg creat   Hydromorphone                  285          EXPECTED  ng/mg creat   Dihydrocodeine                 113          EXPECTED   ng/mg creat   Norhydrocodone                 1030         EXPECTED   ng/mg creat    Sources of hydrocodone include scheduled prescription medications.    Hydromorphone, dihydrocodeine and norhydrocodone are expected    metabolites of hydrocodone. Hydromorphone and dihydrocodeine are    also available as scheduled prescription  medications.  ==================================================================== Test                      Result    Flag   Units      Ref Range   Creatinine              96               mg/dL      >=20 ==================================================================== Declared Medications:  The flagging and interpretation on this report are based on the  following declared medications.  Unexpected results may arise from  inaccuracies in the declared medications.   **Note: The testing scope of this panel includes these medications:   Hydrocodone   **Note: The testing scope of this panel does not include the  following reported medications:   Acetaminophen  Albuterol  Amlodipine  Aspirin  Calcium  Clonidine (Catapres)  Diclofenac (Voltaren)  Famotidine  Hydrochlorothiazide  Iron  Isosorbide (Imdur)  Olmesartan  Omeprazole  Pravastatin  Vitamin D ==================================================================== For clinical consultation, please call [phone]. ====================================================================       ROS  Constitutional: Denies any fever or chills Gastrointestinal: No reported hemesis, hematochezia, vomiting, or acute GI distress Musculoskeletal:  Low back, bilateral knee pain Neurological: No reported episodes of acute onset apraxia, aphasia, dysarthria, agnosia, amnesia, paralysis, loss of coordination, or loss of consciousness  Medication Review  HYDROcodone-acetaminophen, Olmesartan-amLODIPine-HCTZ, albuterol, alendronate, aspirin EC, atorvastatin, calcium citrate-vitamin D, carvedilol, diclofenac Sodium, ferrous sulfate, isosorbide mononitrate, and omeprazole  History Review  Allergy: Jocelyn [name] has No Known Allergies. Drug: Jocelyn [name]  reports no history of drug use. Alcohol:  reports no history of alcohol use. Tobacco:  reports that she has never smoked. She has never used smokeless tobacco. Social: Jocelyn [name]   reports that she has never smoked. She has never used smokeless tobacco. She reports that she does not drink alcohol and does not use drugs. Medical:  has a past medical history of Arthritis of both knees (08/01/2015), Colitis, Essential hypertension, benign (07/28/2014), GERD (gastroesophageal reflux disease), iron deficiency anemia (09/02/2015), Hyperlipidemia, Hypertension, Lumbar pain, Medicare annual wellness visit, subsequent (04/07/2017), OSA on CPAP (08/01/2015), Overweight (BMI 25.0-29.9) (07/10/2013), Prediabetes (09/02/2015), and SOB (shortness of breath) (07/10/2013). Surgical: Jocelyn [name]  has a past surgical history that includes Cataract extraction; Abdominal hysterectomy; Cholecystectomy; Lumbar disc surgery; and Colonoscopy with propofol (N/A, 04/09/2018). Family: family history includes Anuerysm in her son; Cancer in her daughter and father; Diabetes in her daughter and mother; Heart disease in her mother; Hypertension in her daughter.  Laboratory Chemistry Profile   Renal Lab Results  Component Value Date   BUN 14 08/01/2018   CREATININE 0.80 38/25/0539   BCR NOT APPLICABLE 76/73/4193   GFRAA 78 08/01/2018   GFRNONAA 67 08/01/2018     Hepatic Lab Results  Component Value Date   AST 20 03/02/2020  ALT 18 03/02/2020   ALBUMIN 4.1 03/02/2020   ALKPHOS 63 03/02/2020     Electrolytes Lab Results  Component Value Date   NA 140 08/01/2018   K 3.9 08/01/2018   CL 103 08/01/2018   CALCIUM 10.0 08/01/2018     Bone No results found for: VD25OH, VD125OH2TOT, [id number], [id number], 25OHVITD1, 25OHVITD2, 25OHVITD3, TESTOFREE, TESTOSTERONE   Inflammation (CRP: Acute Phase) (ESR: Chronic Phase) No results found for: CRP, ESRSEDRATE, LATICACIDVEN     Note: Above Lab results reviewed.  Recent Imaging Review  DG BONE DENSITY (DXA) EXAM: DUAL X-RAY ABSORPTIOMETRY (DXA) FOR BONE MINERAL DENSITY  IMPRESSION: Your patient Jocelyn [name] completed a BMD test on 05/12/2020 using the [employer] (software version: 14.10) manufactured by [employer]. The following summarizes the results of our evaluation. Technologist: SCE PATIENT BIOGRAPHICAL: Name: Jocelyn [name], Jocelyn [name] Patient ID: [id number] Birth Date: [birth year] Height: 61.5 in. Gender: Female Exam Date: 05/12/2020 Weight: 163.2 lbs. Indications: Advanced Age, Height Loss, History of Fracture (Adult), History of Spinal Surgery, Hysterectomy, Postmenopausal, Rheumatoid Arthritis Fractures: Left wrist Treatments: calcium w/ vit D, Omeprazole DENSITOMETRY RESULTS: Site          Region      Measured Date Measured Age WHO Classification Young Adult T-score BMD         %Change vs. Previous Significant Change (*) DualFemur Total Right 05/12/[age] 86.8 Osteoporosis -3.6 0.549 g/cm2 - -  Right Forearm Radius 33% 05/12/2020 86.8 Osteoporosis -4.1 0.518 g/cm2 - - ASSESSMENT:  The BMD measured at Forearm Radius 33% is 0.518 g/cm2 with a T-score of -4.1. This patient is considered osteoporotic according to [facility]) criteria. The scan quality is good. Lumbar spine was not utilized due to (advanced degenerative changes/scoliosis.  World Pharmacologist [facility]) criteria for post-menopausal, Caucasian Women: Normal:                   T-score at or above -1 SD Osteopenia/low bone mass: T-score between -1 and -2.5 SD Osteoporosis:             T-score at or below -2.5 SD  RECOMMENDATIONS:  1. All patients should optimize calcium and vitamin D intake. 2. Consider FDA-approved medical therapies in postmenopausal women and men aged 58 years and older, based on the following: a. A hip or vertebral(clinical or morphometric) fracture b. T-score < -2.5 at the femoral neck or spine after appropriate evaluation to exclude secondary causes c. Low bone mass (T-score between -1.0 and -2.5 at the femoral neck or spine) and a 10-year probability of a hip fracture > 3% or a 10-year probability  of a major osteoporosis-related fracture > 20% based on the [place]-adapted WHO algorithm 3. Clinician judgment and/or patient preferences may indicate treatment for people with 10-year fracture probabilities above or below these levels  FOLLOW-UP:  People with diagnosed cases of osteoporosis or at high risk for fracture should have regular bone mineral density tests. For patients eligible for Medicare, routine testing is allowed once every 2 years. The testing frequency can be increased to one year for patients who have rapidly progressing disease, those who are receiving or discontinuing medical therapy to restore bone mass, or have additional risk factors.  I have reviewed this report, and agree with the above findings.  [facility]  Electronically Signed   By: Rolm Baptise M.D.   On: 05/12/2020 12:57  Note: Reviewed        Physical Exam  General appearance: Well  nourished, well developed, and well hydrated. In no apparent acute distress Mental status: Alert, oriented x 3 (person, place, & time)       Respiratory: No evidence of acute respiratory distress Eyes: PERLA Vitals: BP (!) 151/51    Pulse 94    Temp (!) 97.3 F (36.3 C)    Resp 15    Ht 5' 1"  (1.549 m)    Wt 161 lb (73 kg)    SpO2 100%    BMI 30.42 kg/m  BMI: Estimated body mass index is 30.42 kg/m as calculated from the following:   Height as of this encounter: 5' 1"  (1.549 m).   Weight as of this encounter: 161 lb (73 kg). Ideal: Ideal body weight: 47.8 kg (105 lb 6.1 oz) Adjusted ideal body weight: 57.9 kg (127 lb 10 oz)  Patient presents today in wheelchair.  Low back pain, bilateral knee pain.  Assessment   Status Diagnosis  Having a Flare-up Controlled Controlled 1. Bilateral primary osteoarthritis of knee   2. Primary osteoarthritis of right knee   3. SI joint arthritis   4. Lumbar facet arthropathy   5. Lumbar degenerative disc disease   6. Chronic pain syndrome        Plan of  Care  Jocelyn [name] has a current medication list which includes the following long-term medication(s): atorvastatin, calcium citrate-vitamin d, carvedilol, ferrous sulfate, isosorbide mononitrate, and olmesartan-amlodipine-hctz.  Pharmacotherapy (Medications Ordered): Meds ordered this encounter  Medications   HYDROcodone-acetaminophen (NORCO/VICODIN) 5-325 MG tablet    Sig: Take 1 tablet by mouth every 12 (twelve) hours as needed for severe pain. Must last 30 days.    Dispense:  60 tablet    Refill:  0    Chronic Pain: STOP Act (Not applicable) Fill 1 day early if closed on refill date. Avoid benzodiazepines within 8 hours of opioids   HYDROcodone-acetaminophen (NORCO/VICODIN) 5-325 MG tablet    Sig: Take 1 tablet by mouth every 12 (twelve) hours as needed for severe pain. Must last 30 days.    Dispense:  60 tablet    Refill:  0    Chronic Pain: STOP Act (Not applicable) Fill 1 day early if closed on refill date. Avoid benzodiazepines within 8 hours of opioids   HYDROcodone-acetaminophen (NORCO/VICODIN) 5-325 MG tablet    Sig: Take 1 tablet by mouth every 12 (twelve) hours as needed for severe pain. Must last 30 days.    Dispense:  60 tablet    Refill:  0    Chronic Pain: STOP Act (Not applicable) Fill 1 day early if closed on refill date. Avoid benzodiazepines within 8 hours of opioids    B/L monovisc injection for knee pain related to knee OA Orders Placed This Encounter  Procedures   KNEE INJECTION    Local Anesthetic & Steroid injection.    Standing Status:   Future    Standing Expiration Date:   05/19/2021    Scheduling Instructions:     Side: Bilateral     Sedation: None     Timeframe: As soon as schedule allows    Order Specific Question:   Where will this procedure be performed?    Answer:   [facility]   [employer] Select 13 (MW), Urine    Volume: 30 ml(s). Minimum 3 ml of urine is needed. Document temperature of fresh sample. Indications: Long term  (current) use of opiate analgesic (D32.202)    Order Specific Question:   Release to patient  Answer:   Immediate      Follow-up plan:   Return in about 2 weeks (around 03/04/2021) for Left knee steroid injection, without sedation.   Recent Visits Date Type Provider Dept  12/07/20 Procedure visit Gillis Santa, MD [facility]  11/24/20 Office Visit Gillis Santa, MD [facility]  Showing recent visits within past 90 days and meeting all other requirements Today's Visits Date Type Provider Dept  02/18/21 Office Visit Gillis Santa, MD [facility]  Showing today's visits and meeting all other requirements Future Appointments No visits were found meeting these conditions. Showing future appointments within next 90 days and meeting all other requirements  I discussed the assessment and treatment plan with the patient. The patient was provided an opportunity to ask questions and all were answered. The patient agreed with the plan and demonstrated an understanding of the instructions.  Patient advised to call back or seek an in-person evaluation if the symptoms or condition worsens.  Duration of encounter: 30 minutes.  Note by: Gillis Santa, MD Date: 02/18/2021; Time: 11:29 AM

## 2021-02-23 LAB — TOXASSURE SELECT 13 (MW), URINE

## 2021-03-10 ENCOUNTER — Ambulatory Visit
Payer: Medicare Other | Attending: Student in an Organized Health Care Education/Training Program | Admitting: Student in an Organized Health Care Education/Training Program

## 2021-03-10 ENCOUNTER — Other Ambulatory Visit: Payer: Self-pay

## 2021-03-10 ENCOUNTER — Encounter: Payer: Self-pay | Admitting: Student in an Organized Health Care Education/Training Program

## 2021-03-10 VITALS — BP 155/71 | HR 69 | Temp 97.5°F | Resp 14 | Ht 61.0 in | Wt 159.0 lb

## 2021-03-10 DIAGNOSIS — G894 Chronic pain syndrome: Secondary | ICD-10-CM | POA: Diagnosis present

## 2021-03-10 DIAGNOSIS — M170 Bilateral primary osteoarthritis of knee: Secondary | ICD-10-CM | POA: Diagnosis present

## 2021-03-10 MED ORDER — HYDROCODONE-ACETAMINOPHEN 5-325 MG PO TABS: 1.0000 | ORAL_TABLET | Freq: Two times a day (BID) | ORAL | 0 refills | Status: DC | PRN

## 2021-03-10 MED ORDER — HYDROCODONE-ACETAMINOPHEN 5-325 MG PO TABS: 1.0000 | ORAL_TABLET | Freq: Two times a day (BID) | ORAL | 0 refills | Status: AC | PRN

## 2021-03-10 MED ORDER — ROPIVACAINE HCL 2 MG/ML IJ SOLN
4.0000 mL | Freq: Once | INTRAMUSCULAR | Status: AC
  Administered 2021-03-10: 20 mL via PERINEURAL

## 2021-03-10 MED ORDER — METHYLPREDNISOLONE ACETATE 40 MG/ML IJ SUSP
INTRAMUSCULAR | Status: AC
  Filled 2021-03-10: qty 1

## 2021-03-10 MED ORDER — METHYLPREDNISOLONE ACETATE 40 MG/ML IJ SUSP
40.0000 mg | Freq: Once | INTRAMUSCULAR | Status: AC
  Administered 2021-03-10: 40 mg via INTRA_ARTICULAR

## 2021-03-10 MED ORDER — ROPIVACAINE HCL 2 MG/ML IJ SOLN
INTRAMUSCULAR | Status: AC
  Filled 2021-03-10: qty 20

## 2021-03-10 NOTE — Patient Instructions (Signed)

## 2021-03-10 NOTE — Progress Notes (Signed)
PROVIDER NOTE: Interpretation of information contained herein should be left to medically-trained personnel. Specific patient instructions are provided elsewhere under "Patient Instructions" section of medical record. This document was created in part using STT-dictation technology, any transcriptional errors that may result from this process are unintentional.  Patient: Jocelyn [name] Type: Established DOB: [birth year] MRN: [id number] PCP: Crissie Figures, PA-C  Service: Procedure DOS: 03/10/2021 Setting: Ambulatory Location: Ambulatory outpatient facility Delivery: Face-to-face Provider: Gillis Santa, MD Specialty: Interventional Pain Management Specialty designation: 09 Location: Outpatient facility Ref. Prov.: Crissie Figures, PA-C    Primary Reason for Visit: Interventional Pain Management Treatment. CC: Knee Pain (left)   Procedure:           Type: Steroid Intra-articular Knee Injection Laterality: Left (-LT) No.: n/a   Series: n/a Level/approach: Medial Imaging guidance: None required ([id number]) Anesthesia: Local anesthesia (1-2% Lidocaine) Anxiolysis: None                 Sedation: None.  Purpose: Diagnostic/Therapeutic Indications: Knee arthralgia associated to osteoarthritis of the knee 1. Bilateral primary osteoarthritis of knee   2. Chronic pain syndrome    NAS-11 score:   Pre-procedure: 10-Worst pain ever/10   Post-procedure: 6/10     Pre-Procedure Preparation  Monitoring: As per clinic protocol.  Risk Assessment: Vitals:  OEH:[id number] body mass index is 30.04 kg/m as calculated from the following:   Height as of this encounter: 5\' 1"  (1.549 m).   Weight as of this encounter: 159 lb (72.1 kg)., Rate:69 , BP:(!) 155/71, Resp:14, Temp:(!) 97.5 F (36.4 C), SpO2:98 %  Allergies: She has No Known Allergies.  Precautions: No additional precautions required  Blood-thinner(s): None at this time  Coagulopathies: Reviewed. None identified.   Active Infection(s):  Reviewed. None identified. Jocelyn [name] is afebrile   Location setting: Exam room Position: Sitting w/ knee bent 90 degrees Safety Precautions: Patient was assessed for positional comfort and pressure points before starting the procedure. Prepping solution: DuraPrep (Iodine Povacrylex [0.7% available iodine] and Isopropyl Alcohol, 74% w/w) Prep Area: Entire knee region Approach: percutaneous, just above the tibial plateau, lateral to the infrapatellar tendon. Intended target: Intra-articular knee space Materials: Tray: Block Needle(s): Regular Qty: 1/side Length: 1.5-inch Gauge: 25G   Meds ordered this encounter  Medications   methylPREDNISolone acetate (DEPO-MEDROL) injection 40 mg   ropivacaine (PF) 2 mg/mL (0.2%) (NAROPIN) injection 4 mL   HYDROcodone-acetaminophen (NORCO/VICODIN) 5-325 MG tablet    Sig: Take 1 tablet by mouth every 12 (twelve) hours as needed for severe pain. Must last 30 days.    Dispense:  60 tablet    Refill:  0    Chronic Pain: STOP Act (Not applicable) Fill 1 day early if closed on refill date. Avoid benzodiazepines within 8 hours of opioids   HYDROcodone-acetaminophen (NORCO/VICODIN) 5-325 MG tablet    Sig: Take 1 tablet by mouth every 12 (twelve) hours as needed for severe pain. Must last 30 days.    Dispense:  60 tablet    Refill:  0    Chronic Pain: STOP Act (Not applicable) Fill 1 day early if closed on refill date. Avoid benzodiazepines within 8 hours of opioids   HYDROcodone-acetaminophen (NORCO/VICODIN) 5-325 MG tablet    Sig: Take 1 tablet by mouth every 12 (twelve) hours as needed for severe pain. Must last 30 days.    Dispense:  60 tablet    Refill:  0    Chronic Pain: STOP Act (Not applicable) Fill 1 day early if closed on refill date.  Avoid benzodiazepines within 8 hours of opioids    No orders of the defined types were placed in this encounter.    Time-out: 1030 I initiated and conducted the "Time-out" before starting the procedure, as per  protocol. The patient was asked to participate by confirming the accuracy of the "Time Out" information. Verification of the correct person, site, and procedure were performed and confirmed by me, the nursing staff, and the patient. "Time-out" conducted as per Joint Commission's Universal Protocol (UP.01.01.01). Procedure checklist: Completed   H&P (Pre-op  Assessment)  Jocelyn [name] is a 88 y.o. (year old), female patient, seen today for interventional treatment. She  has a past surgical history that includes Cataract extraction; Abdominal hysterectomy; Cholecystectomy; Lumbar disc surgery; and Colonoscopy with propofol (N/A, 04/09/2018). Jocelyn [name] has a current medication list which includes the following prescription(s): albuterol, alendronate, aspirin ec, atorvastatin, calcium citrate-vitamin d, carvedilol, diclofenac sodium, ferrous sulfate, isosorbide mononitrate, olmesartan-amlodipine-hctz, omeprazole, hydrocodone-acetaminophen, [START ON 04/09/2021] hydrocodone-acetaminophen, and [START ON 05/09/2021] hydrocodone-acetaminophen. Her primarily concern today is the Knee Pain (left)  She has No Known Allergies.   Last encounter: My last encounter with her was on 02/18/2021. Pertinent problems: Jocelyn [name] has Lumbar degenerative disc disease; Hearing loss; Cerebrovascular accident (CVA) ([place]); Controlled substance agreement signed; Bilateral primary osteoarthritis of knee; Carpal tunnel syndrome on right; SI joint arthritis; Chronic pain of both knees; and Chronic pain syndrome on their pertinent problem list. Pain Assessment: Severity of Chronic pain is reported as a 10-Worst pain ever/10. Location: Knee Left/left lower leg. Onset: More than a month ago. Quality: Aching. Timing: Intermittent. Modifying factor(s): medications. Vitals:  height is 5\' 1"  (1.549 m) and weight is 159 lb (72.1 kg). Her temporal temperature is 97.5 F (36.4 C) (abnormal). Her blood pressure is 155/71 (abnormal) and her pulse is 69.  Her respiration is 14 and oxygen saturation is 98%.   Reason for encounter: "interventional pain management therapy due pain of at least four (4) weeks in duration, with failure to respond and/or inability to tolerate more conservative care.   Site Confirmation: Ms. Krenz was asked to confirm the procedure and laterality before marking the site.  Consent: Before the procedure and under the influence of no sedative(s), amnesic(s), or anxiolytics, the patient was informed of the treatment options, risks and possible complications. To fulfill our ethical and legal obligations, as recommended by the American Medical Association's Code of Ethics, I have informed the patient of my clinical impression; the nature and purpose of the treatment or procedure; the risks, benefits, and possible complications of the intervention; the alternatives, including doing nothing; the risk(s) and benefit(s) of the alternative treatment(s) or procedure(s); and the risk(s) and benefit(s) of doing nothing. The patient was provided information about the general risks and possible complications associated with the procedure. These may include, but are not limited to: failure to achieve desired goals, infection, bleeding, organ or nerve damage, allergic reactions, paralysis, and death. In addition, the patient was informed of those risks and complications associated to Spine-related procedures, such as failure to decrease pain; infection (i.e.: Meningitis, epidural or intraspinal abscess); bleeding (i.e.: epidural hematoma, subarachnoid hemorrhage, or any other type of intraspinal or peri-dural bleeding); organ or nerve damage (i.e.: Any type of peripheral nerve, nerve root, or spinal cord injury) with subsequent damage to sensory, motor, and/or autonomic systems, resulting in permanent pain, numbness, and/or weakness of one or several areas of the body; allergic reactions; (i.e.: anaphylactic reaction); and/or death. Furthermore, the  patient was informed of those  risks and complications associated with the medications. These include, but are not limited to: allergic reactions (i.e.: anaphylactic or anaphylactoid reaction(s)); adrenal axis suppression; blood sugar elevation that in diabetics may result in ketoacidosis or comma; water retention that in patients with history of congestive heart failure may result in shortness of breath, pulmonary edema, and decompensation with resultant heart failure; weight gain; swelling or edema; medication-induced neural toxicity; particulate matter embolism and blood vessel occlusion with resultant organ, and/or nervous system infarction; and/or aseptic necrosis of one or more joints. Finally, the patient was informed that Medicine is not an exact science; therefore, there is also the possibility of unforeseen or unpredictable risks and/or possible complications that may result in a catastrophic outcome. The patient indicated having understood very clearly. We have given the patient no guarantees and we have made no promises. Enough time was given to the patient to ask questions, all of which were answered to the patient's satisfaction. Ms. Buresh has indicated that she wanted to continue with the procedure. Attestation: I, the ordering provider, attest that I have discussed with the patient the benefits, risks, side-effects, alternatives, likelihood of achieving goals, and potential problems during recovery for the procedure that I have provided informed consent.  Date   Time: 03/10/2021 10:00 AM   Description of procedure   Start Time: 1030 hrs  Local Anesthesia: Once the patient was positioned, prepped, and time-out was completed. The target area was identified located. The skin was marked with an approved surgical skin marker. Once marked, the skin (epidermis, dermis, and hypodermis), and deeper tissues (fat, connective tissue and muscle) were infiltrated with a small amount of a short-acting local  anesthetic, loaded on a 10cc syringe with a 25G, 1.5-in  Needle. An appropriate amount of time was allowed for local anesthetics to take effect before proceeding to the next step. Local Anesthetic: Lidocaine 1-2% The unused portion of the local anesthetic was discarded in the proper designated containers. Safety Precautions: Aspiration looking for blood return was conducted prior to all injections. At no point did I inject any substances, as a needle was being advanced. Before injecting, the patient was told to immediately notify me if she was experiencing any new onset of "ringing in the ears, or metallic taste in the mouth". No attempts were made at seeking any paresthesias. Safe injection practices and needle disposal techniques used. Medications properly checked for expiration dates. SDV (single dose vial) medications used. After the completion of the procedure, all disposable equipment used was discarded in the proper designated medical waste containers.  Technical description: Protocol guidelines were followed. After positioning, the target area was identified and prepped in the usual manner. Skin & deeper tissues infiltrated with local anesthetic. Appropriate amount of time allowed to pass for local anesthetics to take effect. Proper needle placement secured. Once satisfactory needle placement was confirmed, I proceeded to inject the desired solution in slow, incremental fashion, intermittently assessing for discomfort or any signs of abnormal or undesired spread of substance. Once completed, the needle was removed and disposed of, as per hospital protocols. The area was cleaned, making sure to leave some of the prepping solution back to take advantage of its long term bactericidal properties.  Aspiration:  Negative   Vitals:   03/10/21 1008  BP: (!) 155/71  Pulse: 69  Resp: 14  Temp: (!) 97.5 F (36.4 C)  TempSrc: Temporal  SpO2: 98%  Weight: 159 lb (72.1 kg)  Height: 5\' 1"  (1.549 m)     End  Time: 1031 hrs   Post-op assessment  Post-procedure Vital Signs:  Pulse/HCG Rate: 69  Temp: (!) 97.5 F (36.4 C) Resp: 14 BP: (!) 155/71 SpO2: 98 %  EBL: None  Complications: No immediate post-treatment complications observed by team, or reported by patient.  Note: The patient tolerated the entire procedure well. A repeat set of vitals were taken after the procedure and the patient was kept under observation following institutional policy, for this type of procedure. Post-procedural neurological assessment was performed, showing return to baseline, prior to discharge. The patient was provided with post-procedure discharge instructions, including a section on how to identify potential problems. Should any problems arise concerning this procedure, the patient was given instructions to immediately contact us, at any time, without hesitation. In any case, we plan to contact the patient by telephone for a follow-up status report regarding this interventional procedure.  Comments:  No additional relevant information.   Plan of care  Chronic Opioid Analgesic:  Hydrocodone 5 mg BID prn #60/month   Medications administered: We administered methylPREDNISolone acetate and ropivacaine (PF) 2 mg/mL (0.2%).  Patient's original pharmacy, [facility] does not have immaturity of hydrocodone.  Resending to [facility] below.  PMP checked and reviewed.  Requested Prescriptions   Signed Prescriptions Disp Refills   HYDROcodone-acetaminophen (NORCO/VICODIN) 5-325 MG tablet 60 tablet 0    Sig: Take 1 tablet by mouth every 12 (twelve) hours as needed for severe pain. Must last 30 days.   HYDROcodone-acetaminophen (NORCO/VICODIN) 5-325 MG tablet 60 tablet 0    Sig: Take 1 tablet by mouth every 12 (twelve) hours as needed for severe pain. Must last 30 days.   HYDROcodone-acetaminophen (NORCO/VICODIN) 5-325 MG tablet 60 tablet 0    Sig: Take 1 tablet by mouth every 12 (twelve) hours as needed for severe  pain. Must last 30 days.     Follow-up plan:   Return in about 4 weeks (around 04/07/2021) for VV PPE.     Recent Visits Date Type Provider Dept  02/18/21 Office Visit Gillis Santa, MD [facility]  Showing recent visits within past 90 days and meeting all other requirements Today's Visits Date Type Provider Dept  03/10/21 Procedure visit Gillis Santa, MD [facility]  Showing today's visits and meeting all other requirements Future Appointments Date Type Provider Dept  04/06/21 Appointment Gillis Santa, MD [facility]  05/18/21 Appointment Gillis Santa, MD [facility]  Showing future appointments within next 90 days and meeting all other requirements   Disposition: Discharge home  Discharge (Date   Time): 03/10/2021; 1035 hrs.   Primary Care Physician: Crissie Figures, PA-C Location: [facility] Note by: Gillis Santa, MD Date: 03/10/2021; Time: 11:01 AM  DISCLAIMER: Medicine is not an exact science. It has no guarantees or warranties. The decision to proceed with this intervention was based on the information collected from the patient. Conclusions were drawn from the patient's questionnaire, interview, and examination. Because information was provided in large part by the patient, it cannot be guaranteed that it has not been purposely or unconsciously manipulated or altered. Every effort has been made to obtain as much accurate, relevant, available data as possible. Always take into account that the treatment will also be dependent on availability of resources and existing treatment guidelines, considered by other Pain Management Specialists as being common knowledge and practice, at the time of the intervention. It is also important to point out that variation in procedural techniques and pharmacological choices are the acceptable  norm. For Medico-Legal review purposes, the indications, contraindications, technique, and  results of the these procedures should only be evaluated, judged and interpreted by a Board-Certified Interventional Pain Specialist with extensive familiarity and expertise in the same exact procedure and technique.

## 2021-03-11 ENCOUNTER — Telehealth: Payer: Self-pay

## 2021-03-11 NOTE — Telephone Encounter (Signed)
Post procedure phone call.  LM 

## 2021-03-29 ENCOUNTER — Ambulatory Visit: Payer: Medicare Other | Admitting: Physician Assistant

## 2021-03-31 ENCOUNTER — Other Ambulatory Visit
Admission: RE | Admit: 2021-03-31 | Discharge: 2021-03-31 | Disposition: A | Discharge status: Home / Self Care | Payer: Medicare Other | Source: Ambulatory Visit | Attending: Medical | Admitting: Medical

## 2021-03-31 ENCOUNTER — Ambulatory Visit (INDEPENDENT_AMBULATORY_CARE_PROVIDER_SITE_OTHER): Payer: Medicare Other | Admitting: Medical

## 2021-03-31 ENCOUNTER — Encounter: Payer: Self-pay | Admitting: Medical

## 2021-03-31 ENCOUNTER — Other Ambulatory Visit: Payer: Self-pay

## 2021-03-31 VITALS — BP 154/70 | HR 68 | Ht 61.0 in | Wt 158.1 lb

## 2021-03-31 DIAGNOSIS — R0609 Other forms of dyspnea: Secondary | ICD-10-CM

## 2021-03-31 DIAGNOSIS — I2584 Coronary atherosclerosis due to calcified coronary lesion: Secondary | ICD-10-CM

## 2021-03-31 DIAGNOSIS — I2089 Other forms of angina pectoris: Secondary | ICD-10-CM

## 2021-03-31 DIAGNOSIS — E785 Hyperlipidemia, unspecified: Secondary | ICD-10-CM | POA: Diagnosis not present

## 2021-03-31 DIAGNOSIS — I2510 Atherosclerotic heart disease of native coronary artery without angina pectoris: Secondary | ICD-10-CM

## 2021-03-31 DIAGNOSIS — I208 Other forms of angina pectoris: Secondary | ICD-10-CM | POA: Diagnosis present

## 2021-03-31 DIAGNOSIS — E782 Mixed hyperlipidemia: Secondary | ICD-10-CM

## 2021-03-31 LAB — LIPID PANEL
Cholesterol: 122 mg/dL (ref 0–200)
HDL: 56 mg/dL (ref >40)
LDL Cholesterol: 57 mg/dL (ref 0–99)
Total CHOL/HDL Ratio: 2.2 RATIO
Triglycerides: 47 mg/dL (ref <150)
VLDL: 9 mg/dL (ref 0–40)

## 2021-03-31 LAB — COMPREHENSIVE METABOLIC PANEL
ALT: 20 U/L (ref 0–44)
AST: 24 U/L (ref 15–41)
Albumin: 4 g/dL (ref 3.5–5.0)
Alkaline Phosphatase: 34 U/L — ABNORMAL LOW (ref 38–126)
Anion gap: 7 (ref 5–15)
BUN: 19 mg/dL (ref 8–23)
CO2: 26 mmol/L (ref 22–32)
Calcium: 9.2 mg/dL (ref 8.9–10.3)
Chloride: 105 mmol/L (ref 98–111)
Creatinine, Ser: 0.78 mg/dL (ref 0.44–1.00)
GFR, Estimated: >60 mL/min (ref >60)
Glucose, Bld: 107 mg/dL — ABNORMAL HIGH (ref 70–99)
Potassium: 3.8 mmol/L (ref 3.5–5.1)
Sodium: 138 mmol/L (ref 135–145)
Total Bilirubin: 0.8 mg/dL (ref 0.3–1.2)
Total Protein: 7.1 g/dL (ref 6.5–8.1)

## 2021-03-31 LAB — CBC
HCT: 34.7 % — ABNORMAL LOW (ref 36.0–46.0)
Hemoglobin: 11.1 g/dL — ABNORMAL LOW (ref 12.0–15.0)
MCH: 27.5 pg (ref 26.0–34.0)
MCHC: 32 g/dL (ref 30.0–36.0)
MCV: 86.1 fL (ref 80.0–100.0)
Platelets: 146 10*3/uL — ABNORMAL LOW (ref 150–400)
RBC: 4.03 MIL/uL (ref 3.87–5.11)
RDW: 13.4 % (ref 11.5–15.5)
WBC: 5.2 10*3/uL (ref 4.0–10.5)
nRBC: 0 % (ref 0.0–0.2)

## 2021-03-31 MED ORDER — CARVEDILOL 6.25 MG PO TABS: 6.2500 mg | ORAL_TABLET | Freq: Two times a day (BID) | ORAL | 3 refills | Status: DC

## 2021-03-31 NOTE — Patient Instructions (Signed)
Medication Instructions:  ?Your physician has recommended you make the following change in your medication:  ? ?INCREASE carvedilol (Coreg) to 6.25 mg twice daily  ? ?*If you need a refill on your cardiac medications before your next appointment, please call your pharmacy* ? ? ?Lab Work: ? ?Your provider has ordered labs (CMET, lipid profile, CBC). Please go to the [facility] to have these drawn.  ? ?Nature conservation officer at [facility] ?1st desk on the right to check in, past the screening table ?Lab hours: Monday- Friday (7:30 am- 5:30 pm)  ?If you have labs (blood work) drawn today and your tests are completely normal, you will receive your results only by: ?MyChart Message (if you have MyChart) OR ?A paper copy in the mail ?If you have any lab test that is abnormal or we need to change your treatment, we will call you to review the results. ? ? ?Testing/Procedures: ?None ordered ? ? ?Follow-Up: ?At [facility], you and your health needs are our priority.  As part of our continuing mission to provide you with exceptional heart care, we have created designated Provider Care Teams.  These Care Teams include your primary Cardiologist (physician) and Advanced Practice Providers (APPs -  Physician Assistants and Nurse Practitioners) who all work together to provide you with the care you need, when you need it. ? ?We recommend signing up for the patient portal called "MyChart".  Sign up information is provided on this After Visit Summary.  MyChart is used to connect with patients for Virtual Visits (Telemedicine).  Patients are able to view lab/test results, encounter notes, upcoming appointments, etc.  Non-urgent messages can be sent to your provider as well.   ?To learn more about what you can do with MyChart, go to [email or web address].   ? ?Your next appointment:   ? ?6 months ? ?The format for your next appointment:   ?In Person ? ?Provider:   ?You may see Nelva Bush, MD or one of the following Advanced  Practice Providers on your designated Care Team:   ?Murray Hodgkins, NP ?Christell Faith, PA-C ?Cadence Kathlen Mody, PA-C ? ? ?Other Instructions ?N/A ?

## 2021-03-31 NOTE — Progress Notes (Signed)
Cardiology Office Note:    Date:  03/31/2021   ID:  Jocelyn, [name] [name], MRN [id number]  PCP:  Crissie Figures, PA-C  [facility] Cardiologist:  Nelva Bush, MD  [name] Electrophysiologist:  None   Referring MD: Crissie Figures, PA-C   Chief Complaint: 6 month follow-up  History of Present Illness:    Jocelyn [name] is a 88 y.o. female with a hx of HTN, HLD, stroke, prediabetes, OSA on CPAP, aortic atherosclerosis and mild coronary artery calcification on CT images 2021, and GERD who presents for follow-up.   Remote pharmacologic MPI in 07/2010 showed a small, fixed apical defect with an LVEF of 44% with global hypokinesis with more severe hypokinesis at the apex.   She was evaluated by Dr. Saunders Revel in 10/2019 and reported relatively sudden onset of shortness of breath that began in 09/2019. Echo in 11/2019 showed an EF of 60 to 41%, grade 1 diastolic dysfunction, normal RV systolic function and ventricular cavity size, mild left atrial enlargement, and mild mitral regurgitation with moderate mitral annular calcification. When she was seen in follow-up in late 11/2019 she reported she was feeling better with less shortness of breath though did still have some dyspnea on exertion. In this setting, she underwent Lexiscan MPI on 12/13/2019 that showed no significant ischemia with normal wall motion and an EF estimated at 40% felt to be depressed secondary to GI uptake artifact. CT attenuation corrected images showed mild to moderate aortic atherosclerosis with significant mitral valve annular calcification and mild coronary artery calcification. Overall, this was a low risk scan.  She was last seen in the office on 12/30/2019 and was doing well from a cardiac perspective.  She noted resolution of her exertional dyspnea outside of one episode when she was cleaning her house.  She tolerated discontinuation of clonidine and initiation of carvedilol without issues.  Pravastatin was  discontinued and she was initiated on atorvastatin with recommendation to follow-up today.  She was last seen 08/2020 and was doing well from a cardiac perspective. DOE unchanged from prior visits.   Today, the patient reports she has been doing well since the last visit. No chest pain. She has stable DOE. No LLE, orthopnea, pnd. She sleeps with a CPAP. She lives by herself, she can perform all ADLs. She has family checking on her. BP a little high today. Doesn't check BP at home. Needs general labs today.    Past Medical History:  Diagnosis Date   Arthritis of both knees 08/01/2015   Colitis    Essential hypertension, benign 07/28/2014   GERD (gastroesophageal reflux disease)    Hx of iron deficiency anemia 09/02/2015   Hyperlipidemia    Hypertension    Lumbar pain    Medicare annual wellness visit, subsequent 04/07/2017   OSA on CPAP 08/01/2015   Overweight (BMI 25.0-29.9) 07/10/2013   Prediabetes 09/02/2015   SOB (shortness of breath) 07/10/2013    Past Surgical History:  Procedure Laterality Date   ABDOMINAL HYSTERECTOMY     CATARACT EXTRACTION     CHOLECYSTECTOMY     COLONOSCOPY WITH PROPOFOL N/A 04/09/2018   Procedure: COLONOSCOPY WITH PROPOFOL;  Surgeon: Lin Landsman, MD;  Location: [place];  Service: Gastroenterology;  Laterality: N/A;   LUMBAR DISC SURGERY      Current Medications: Current Meds  Medication Sig   albuterol (VENTOLIN HFA) 108 (90 Base) MCG/ACT inhaler Inhale 1 puff into the lungs as needed.   alendronate (FOSAMAX) 70 MG tablet Take  70 mg by mouth once a week.   aspirin EC 81 MG tablet Take 1 tablet (81 mg total) by mouth daily.   atorvastatin (LIPITOR) 40 MG tablet Take 1 tablet (40 mg total) by mouth daily. Pt needs to keep upcoming appt in Aug for further refills   calcium citrate-vitamin D 500-400 MG-UNIT chewable tablet Chew 1 tablet by mouth 2 (two) times daily.    diclofenac Sodium (VOLTAREN) 1 % GEL Apply topically every morning.   ferrous  sulfate 325 (65 FE) MG tablet Take 1 tablet (325 mg total) by mouth daily.   HYDROcodone-acetaminophen (NORCO/VICODIN) 5-325 MG tablet Take 1 tablet by mouth every 12 (twelve) hours as needed for severe pain. Must last 30 days.   isosorbide mononitrate (IMDUR) 120 MG 24 hr tablet TAKE 1 TABLET BY MOUTH EVERY DAY   Olmesartan-amLODIPine-HCTZ 40-10-12.5 MG TABS Take 1 tablet by mouth daily. For blood pressure   omeprazole (PRILOSEC) 20 MG capsule Take 20 mg by mouth daily.   [DISCONTINUED] carvedilol (COREG) 3.125 MG tablet TAKE 1 TABLET BY MOUTH TWICE A DAY     Allergies:   Patient has no known allergies.   Social History   Socioeconomic History   Marital status: Widowed    Spouse name: Not on file   Number of children: 5   Years of education: Not on file   Highest education level: Not on file  Occupational History   Not on file  Tobacco Use   Smoking status: Never   Smokeless tobacco: Never  Vaping Use   Vaping Use: Never used  Substance and Sexual Activity   Alcohol use: No    Alcohol/week: 0.0 standard drinks   Drug use: No   Sexual activity: Not Currently  Other Topics Concern   Not on file  Social History Narrative   Not on file   Social Determinants of Health   Financial Resource Strain: Not on file  Food Insecurity: Not on file  Transportation Needs: Not on file  Physical Activity: Not on file  Stress: Not on file  Social Connections: Not on file     Family History: The patient's family history includes Anuerysm in her son; Cancer in her daughter and father; Diabetes in her daughter and mother; Heart disease in her mother; Hypertension in her daughter.  ROS:   Please see the history of present illness.     All other systems reviewed and are negative.  EKGs/Labs/Other Studies Reviewed:    The following studies were reviewed today:  Echo 2021   1. Left ventricular ejection fraction, by estimation, is 60 to 65%. The  left ventricle has normal function.  The left ventricle has no regional  wall motion abnormalities. There is severe left ventricular hypertrophy.  Left ventricular diastolic parameters   are consistent with Grade I diastolic dysfunction (impaired relaxation).   2. Right ventricular systolic function is normal. The right ventricular  size is normal. Tricuspid regurgitation signal is inadequate for assessing  PA pressure.   3. Left atrial size was moderately dilated.   4. Mild mitral valve regurgitation. Moderate mitral annular  calcification.   MPI 12/2019 Narrative & Impression  Pharmacological myocardial perfusion imaging study with no significant  ischemia Normal wall motion, EF estimated at 43% (likely depressed secondary to GI uptake artifact) No EKG changes concerning for ischemia at peak stress or in recovery. CT attenuation correction images with mild to moderate aortic atherosclerosis, significant mitral valve annular calcification, mild coronary calcification Low risk scan  Signed, Esmond Plants, MD, Ph.D [facility]    EKG:  EKG is  ordered today.  The ekg ordered today demonstrates NSR, 68bpm, nonspecific T wave abnormality, no changes.  Recent Labs: No results found for requested labs within last 8760 hours.  Recent Lipid Panel    Component Value Date/Time   CHOL 128 03/02/2020 1051   TRIG 60 03/02/2020 1051   HDL 55 03/02/2020 1051   CHOLHDL 2.3 03/02/2020 1051   CHOLHDL 3.1 08/01/2018 1544   VLDL 18 07/07/2016 1144   LDLCALC 60 03/02/2020 1051   LDLCALC 120 (H) 08/01/2018 1544   LDLDIRECT 65 03/02/2020 1051     Physical Exam:    VS:  BP (!) 154/70 (BP Location: Right Arm, Patient Position: Sitting, Cuff Size: Normal)    Pulse 68    Ht 5\' 1"  (1.549 m)    Wt 158 lb 2 oz (71.7 kg)    SpO2 98%    BMI 29.88 kg/m     Wt Readings from Last 3 Encounters:  03/31/21 158 lb 2 oz (71.7 kg)  03/10/21 159 lb (72.1 kg)  02/18/21 161 lb (73 kg)     GEN:  Well nourished, well developed in no acute  distress HEENT: Normal NECK: No JVD; No carotid bruits LYMPHATICS: No lymphadenopathy CARDIAC: RRR, no murmurs, rubs, gallops RESPIRATORY:  Clear to auscultation without rales, wheezing or rhonchi  ABDOMEN: Soft, non-tender, non-distended MUSCULOSKELETAL:  No edema; No deformity  SKIN: Warm and dry NEUROLOGIC:  Alert and oriented x 3 PSYCHIATRIC:  Normal affect   ASSESSMENT:    1. Dyspnea on exertion   2. Stable angina ([place])   3. Hyperlipidemia LDL goal <70   4. Hyperlipidemia, mixed   5. Coronary artery calcification    PLAN:    In order of problems listed above:  DOE  Mild coronary artery calcification/aortic atherosclerosis She has stable DOE that is unchanged. No chest pain. Prior echo and MPI were reassuring. Suspect DOE multifactorial given lung disease and mild CAD. Continue Aspirin, statin, Coreg and Imdur.   HTN BP mildly elevated today. HR 68bpm. She is on Coreg 3.125mg  BID, Imdur 120mg , and combination pill olmesartan-amlodipine-HCTA 40-10-12.5mg  daily. I will increase coreg to 6.25mg  BID. CMET today  Dyslipidemia LDL 66 02/2020. Continue Lipitor 40mg  daily. Update fasting lipid panel today.   Disposition: Follow up in 6 month(s) with MD/APP    Signed, Macauley Mossberg Ninfa Meeker, PA-C  03/31/2021 10:23 AM    [facility]

## 2021-04-06 ENCOUNTER — Encounter: Payer: Self-pay | Admitting: Student in an Organized Health Care Education/Training Program

## 2021-04-06 ENCOUNTER — Other Ambulatory Visit: Payer: Self-pay

## 2021-04-06 ENCOUNTER — Ambulatory Visit
Payer: Medicare Other | Attending: Student in an Organized Health Care Education/Training Program | Admitting: Student in an Organized Health Care Education/Training Program

## 2021-04-06 ENCOUNTER — Telehealth: Payer: Self-pay | Admitting: Emergency Medicine

## 2021-04-06 DIAGNOSIS — G894 Chronic pain syndrome: Secondary | ICD-10-CM

## 2021-04-06 DIAGNOSIS — M1711 Unilateral primary osteoarthritis, right knee: Secondary | ICD-10-CM | POA: Diagnosis not present

## 2021-04-06 DIAGNOSIS — M170 Bilateral primary osteoarthritis of knee: Secondary | ICD-10-CM | POA: Diagnosis not present

## 2021-04-06 NOTE — Progress Notes (Signed)
Patient: Jocelyn [name]  Service Category: E/M  Provider: Gillis Santa, MD  DOB: 05/01/1931  DOS: 04/06/2021  Location: Office  MRN: [id number]  Setting: Ambulatory outpatient  Referring Provider: Crissie Figures, PA-C  Type: Established Patient  Specialty: Interventional Pain Management  PCP: Crissie Figures, PA-C  Location: Remote location  Delivery: TeleHealth     Virtual Encounter - Pain Management PROVIDER NOTE: Information contained herein reflects review and annotations entered in association with encounter. Interpretation of such information and data should be left to medically-trained personnel. Information provided to patient can be located elsewhere in the medical record under "Patient Instructions". Document created using STT-dictation technology, any transcriptional errors that may result from process are unintentional.    Contact & Pharmacy Preferred: [phone] Home: [phone] (home) Mobile: [phone] (mobile) E-mail: No e-mail address on record  [id number] [place] [address] [place]: [phone]: [phone] [id number] [place] [address]: [phone]: [phone]  Pre-screening  [name] "in-person" vs "virtual" encounter. She indicated preferring virtual for this encounter.   Reason COVID-19*   Social distancing based on CDC and AMA recommendations.   I contacted [name] 04/06/2021 via telephone.      I clearly identified myself as BGillis Santa MD. I verified that I was speaking with the correct person using two identifiers (Name: [name] and date of birth: [id number].  Consent I sought verbal advanced consent from [name] virtual visit interactions. I informed [name] possible security and privacy  concerns, risks, and limitations associated with providing "not-in-person" medical evaluation and management services. I also informed [name] the availability of "in-person" appointments. Finally, I informed her that there would be a charge for the virtual visit and that she could be  personally, fully or partially, financially responsible for it. Ms. FMihalkoexpressed understanding and agreed to proceed.   Historic Elements   Ms. Jocelyn DROEGEis a 88y.o. year old, female patient evaluated today after our last contact on 03/10/2021. [name] has a past medical history of Arthritis of both knees (08/01/2015), Colitis, Essential hypertension, benign (07/28/2014), GERD (gastroesophageal reflux disease), iron deficiency anemia (09/02/2015), Hyperlipidemia, Hypertension, Lumbar pain, Medicare annual wellness visit, subsequent (04/07/2017), OSA on CPAP (08/01/2015), Overweight (BMI 25.0-29.9) (07/10/2013), Prediabetes (09/02/2015), and SOB (shortness of breath) (07/10/2013). She also  has a past surgical history that includes Cataract extraction; Abdominal hysterectomy; Cholecystectomy; Lumbar disc surgery; and Colonoscopy with propofol (N/A, 04/09/2018). Ms. FGerardhas a current medication list which includes the following prescription(s): albuterol, alendronate, aspirin ec, atorvastatin, calcium citrate-vitamin d, carvedilol, diclofenac sodium, ferrous sulfate, hydrocodone-acetaminophen, [START ON 04/09/2021] hydrocodone-acetaminophen, [START ON 05/09/2021] hydrocodone-acetaminophen, isosorbide mononitrate, olmesartan-amlodipine-hctz, and omeprazole. She  reports that she has never smoked. She has never used smokeless tobacco. She reports that she does not drink alcohol and does not use drugs. Ms. FCharettehas No Known Allergies.   HPI  Today, she is being contacted for a post-procedure assessment.   Post-procedure evaluation    Type: Steroid Intra-articular Knee Injection Laterality: Left (-LT) No.: n/a   Series:  n/a Level/approach: Medial Imaging guidance: None required (CPT-20610) Anesthesia: Local anesthesia (1-2% Lidocaine) Anxiolysis: None                 Sedation: None.  Purpose: Diagnostic/Therapeutic Indications: Knee arthralgia associated to osteoarthritis of the knee 1. Bilateral primary osteoarthritis of knee   2. Chronic pain syndrome    NAS-11 score:   Pre-procedure: 10-Worst pain ever/10   Post-procedure: 6/10     Effectiveness:  Initial hour after procedure: 100 %  Subsequent 4-6 hours post-procedure: 100 %  Analgesia past initial 6 hours: 85 % Ongoing improvement:  Analgesic:  85% Function: [name] reports improvement in function ROM: Jocelyn [name] reports improvement in ROM   Pharmacotherapy Assessment   Opioid Analgesic: Hydrocodone 5 mg BID prn #60/month   Monitoring: [place] PMP: PDMP reviewed during this encounter.       Pharmacotherapy: No side-effects or adverse reactions reported. Compliance: No problems identified. Effectiveness: Clinically acceptable. Plan: Refer to "POC". UDS:  Summary  Date Value Ref Range Status  02/18/2021 Note  Final    Comment:    ==================================================================== [employer] Select 13 (MW) ==================================================================== Test                             Result       Flag       Units  Drug Present and Declared for Prescription Verification   Hydrocodone                    1777         EXPECTED   ng/mg creat   Hydromorphone                  296          EXPECTED   ng/mg creat   Dihydrocodeine                 124          EXPECTED   ng/mg creat   Norhydrocodone                 923          EXPECTED   ng/mg creat    Sources of hydrocodone include scheduled prescription medications.    Hydromorphone, dihydrocodeine and norhydrocodone are expected    metabolites of hydrocodone. Hydromorphone and dihydrocodeine are    also available as scheduled prescription  medications.  ==================================================================== Test                      Result    Flag   Units      Ref Range   Creatinine              70               mg/dL      >=20 ==================================================================== Declared Medications:  The flagging and interpretation on this report are based on the  following declared medications.  Unexpected results may arise from  inaccuracies in the declared medications.   **Note: The testing scope of this panel includes these medications:   Hydrocodone (Norco)   **Note: The testing scope of this panel does not include the  following reported medications:   Acetaminophen (Norco)  Albuterol  Alendronate (Fosamax)  Aspirin  Atorvastatin (Lipitor)  Calcium  Carvedilol (Coreg)  Diclofenac (Voltaren)  Hydrochlorothiazide  Iron  Isosorbide (Imdur)  Olmesartan  Omeprazole (Prilosec)  Vitamin D ==================================================================== For clinical consultation, please call [phone]. ====================================================================      Laboratory Chemistry Profile   Renal Lab Results  Component Value Date   BUN 19 03/31/2021  CREATININE 0.78 16/96/7893   BCR NOT APPLICABLE 81/02/7508   GFRAA 78 08/01/2018   GFRNONAA >60 03/31/2021    Hepatic Lab Results  Component Value Date   AST 24 03/31/2021   ALT 20 03/31/2021   ALBUMIN 4.0 03/31/2021   ALKPHOS 34 (L) 03/31/2021    Electrolytes Lab Results  Component Value Date   NA 138 03/31/2021   K 3.8 03/31/2021   CL 105 03/31/2021   CALCIUM 9.2 03/31/2021    Bone No results found for: VD25OH, VD125OH2TOT, CH8527PO2, UM3536RW4, 25OHVITD1, 25OHVITD2, 25OHVITD3, TESTOFREE, TESTOSTERONE  Inflammation (CRP: Acute Phase) (ESR: Chronic Phase) No results found for: CRP, ESRSEDRATE, LATICACIDVEN       Note: Above Lab results reviewed.  Imaging  DG BONE DENSITY  (DXA) EXAM: DUAL X-RAY ABSORPTIOMETRY (DXA) FOR BONE MINERAL DENSITY  IMPRESSION: Your patient Jocelyn [name] completed a BMD test on 05/12/2020 using the [place] (software version: 14.10) manufactured by [employer]. The following summarizes the results of our evaluation. Technologist: SCE PATIENT BIOGRAPHICAL: Name: Jocelyn [name], Jocelyn [name] Patient ID: [id number] Birth Date: 06/05/1931 Height: 61.5 in. Gender: Female Exam Date: 05/12/2020 Weight: 163.2 lbs. Indications: Advanced Age, Height Loss, History of Fracture (Adult), History of Spinal Surgery, Hysterectomy, Postmenopausal, Rheumatoid Arthritis Fractures: Left wrist Treatments: calcium w/ vit D, Omeprazole DENSITOMETRY RESULTS: Site          Region      Measured Date Measured Age WHO Classification Young Adult T-score BMD         %Change vs. Previous Significant Change (*) DualFemur Total Right 05/12/2020 86.8 Osteoporosis -3.6 0.549 g/cm2 - -  Right Forearm Radius 33% 05/12/2020 86.8 Osteoporosis -4.1 0.518 g/cm2 - - ASSESSMENT:  The BMD measured at Forearm Radius 33% is 0.518 g/cm2 with a T-score of -4.1. This patient is considered osteoporotic according to [facility]) criteria. The scan quality is good. Lumbar spine was not utilized due to (advanced degenerative changes/scoliosis.  World Pharmacologist [facility]) criteria for post-menopausal, Caucasian Women: Normal:                   T-score at or above -1 SD Osteopenia/low bone mass: T-score between -1 and -2.5 SD Osteoporosis:             T-score at or below -2.5 SD  RECOMMENDATIONS:  1. All patients should optimize calcium and vitamin D intake. 2. Consider FDA-approved medical therapies in postmenopausal women and men aged 28 years and older, based on the following: a. A hip or vertebral(clinical or morphometric) fracture b. T-score < -2.5 at the femoral neck or spine after appropriate evaluation to exclude  secondary causes c. Low bone mass (T-score between -1.0 and -2.5 at the femoral neck or spine) and a 10-year probability of a hip fracture > 3% or a 10-year probability of a major osteoporosis-related fracture > 20% based on the [place]-adapted WHO algorithm 3. Clinician judgment and/or patient preferences may indicate treatment for people with 10-year fracture probabilities above or below these levels  FOLLOW-UP:  People with diagnosed cases of osteoporosis or at high risk for fracture should have regular bone mineral density tests. For patients eligible for Medicare, routine testing is allowed once every 2 years. The testing frequency can be increased to one year for patients who have rapidly progressing disease, those who are receiving or discontinuing medical therapy to restore bone mass, or have additional risk factors.  I have reviewed this report, and agree with the above findings.  [facility]  Electronically Signed   By: Rolm Baptise M.D.   On: 05/12/2020 12:57  Assessment  The primary encounter diagnosis was Bilateral primary osteoarthritis of knee. Diagnoses of Primary osteoarthritis of right knee and Chronic pain syndrome were also pertinent to this visit.  Plan of Care  Good relief from prior left knee intra-articular steroid injection. She states that she is able to perform ADLs more comfortably. She has a medication management appointment next month which I will see her back for.  Follow-up plan:   Return for Keep sch. appt.    Recent Visits Date Type Provider Dept  03/10/21 Procedure visit Gillis Santa, MD [facility]  02/18/21 Office Visit Gillis Santa, MD [facility]  Showing recent visits within past 90 days and meeting all other requirements Today's Visits Date Type Provider Dept  04/06/21 Office Visit Gillis Santa, MD [facility]  Showing today's visits and meeting all other requirements Future  Appointments Date Type Provider Dept  05/18/21 Appointment Gillis Santa, MD [facility]  Showing future appointments within next 90 days and meeting all other requirements  I discussed the assessment and treatment plan with the patient. The patient was provided an opportunity to ask questions and all were answered. The patient agreed with the plan and demonstrated an understanding of the instructions.  Patient advised to call back or seek an in-person evaluation if the symptoms or condition worsens.  Duration of encounter: 3mnutes.  Note by: BGillis Santa MD Date: 04/06/2021; Time: 1:26 PM

## 2021-04-06 NOTE — Telephone Encounter (Signed)
-----   Message from [place], PA-C sent at 04/02/2021  9:46 AM EST ----- ?Labs overall look stable, no further changes ?

## 2021-04-06 NOTE — Telephone Encounter (Signed)
Called and spoke with patient's daughter, OK per DPR. Reviewed results. Daughter verbalized understanding. Questions, if any, answered. ?

## 2021-04-09 ENCOUNTER — Other Ambulatory Visit: Payer: Self-pay | Admitting: Internal Medicine

## 2021-04-10 ENCOUNTER — Other Ambulatory Visit: Payer: Self-pay | Admitting: Physician Assistant

## 2021-05-18 ENCOUNTER — Encounter: Payer: Self-pay | Admitting: Student in an Organized Health Care Education/Training Program

## 2021-05-18 ENCOUNTER — Ambulatory Visit
Payer: Medicare Other | Attending: Student in an Organized Health Care Education/Training Program | Admitting: Student in an Organized Health Care Education/Training Program

## 2021-05-18 VITALS — BP 157/77 | HR 74 | Temp 97.0°F | Resp 16 | Ht 61.0 in | Wt 155.0 lb

## 2021-05-18 DIAGNOSIS — M170 Bilateral primary osteoarthritis of knee: Secondary | ICD-10-CM | POA: Diagnosis present

## 2021-05-18 DIAGNOSIS — M5136 Other intervertebral disc degeneration, lumbar region: Secondary | ICD-10-CM | POA: Diagnosis present

## 2021-05-18 DIAGNOSIS — M47818 Spondylosis without myelopathy or radiculopathy, sacral and sacrococcygeal region: Secondary | ICD-10-CM | POA: Insufficient documentation

## 2021-05-18 DIAGNOSIS — M1711 Unilateral primary osteoarthritis, right knee: Secondary | ICD-10-CM | POA: Diagnosis present

## 2021-05-18 DIAGNOSIS — G894 Chronic pain syndrome: Secondary | ICD-10-CM | POA: Insufficient documentation

## 2021-05-18 DIAGNOSIS — M47816 Spondylosis without myelopathy or radiculopathy, lumbar region: Secondary | ICD-10-CM | POA: Insufficient documentation

## 2021-05-18 MED ORDER — HYDROCODONE-ACETAMINOPHEN 5-325 MG PO TABS: 1.0000 | ORAL_TABLET | Freq: Two times a day (BID) | ORAL | 0 refills | Status: AC | PRN

## 2021-05-18 NOTE — Progress Notes (Signed)
PROVIDER NOTE: Information contained herein reflects review and annotations entered in association with encounter. Interpretation of such information and data should be left to medically-trained personnel. Information provided to patient can be located elsewhere in the medical record under "Patient Instructions". Document created using STT-dictation technology, any transcriptional errors that may result from process are unintentional.  ?  ?Patient: Jocelyn [name]  Service Category: E/M  Provider: Gillis Santa, MD  ?DOB: 05-26-1931  DOS: 05/18/2021  Specialty: Interventional Pain Management  ?MRN: [id number]  Setting: Ambulatory outpatient  PCP: Crissie Figures, PA-C  ?Type: Established Patient    Referring Provider: Crissie Figures, PA-C  ?Location: Office  Delivery: Face-to-face    ? ?HPI  ?Jocelyn [name], a 88 y.o. year old female, is here today because of her Primary osteoarthritis of right knee [M17.11]. Ms. Sans primary complain today is Back Pain ? ?Last encounter: My last encounter with her was on 04/06/21 ? ?Pertinent problems: Ms. Fowers has Lumbar degenerative disc disease; Hearing loss; Cerebrovascular accident (CVA) ([place]); Controlled substance agreement signed; Bilateral primary osteoarthritis of knee; Carpal tunnel syndrome on right; SI joint arthritis; Chronic pain of both knees; and Chronic pain syndrome on their pertinent problem list. ?Pain Assessment: Severity of   is reported as a 0-No pain ("I just took my medicines")/10. Location: Back Lower/denies. Onset:  . Quality: Aching, Discomfort, Dull. Timing:  . Modifying factor(s): Medicines. ?Vitals:  height is 5' 1"  (1.549 m) and weight is 155 lb (70.3 kg). Her temperature is 97 ?F (36.1 ?C) (abnormal). Her blood pressure is 157/77 (abnormal) and her pulse is 74. Her respiration is 16 and oxygen saturation is 98%.  ? ?Reason for encounter: medication management.   ? ? ?No change in medical history since last visit.  Patient's pain is at  baseline.  Patient continues multimodal pain regimen as prescribed.  States that it provides pain relief and improvement in functional status. ? ? ?Pharmacotherapy Assessment  ?Analgesic: Hydrocodone 5 mg BID prn #60/month  ? ?Monitoring: ?[place] PMP: PDMP reviewed during this encounter.       ?Pharmacotherapy: No side-effects or adverse reactions reported. ?Compliance: No problems identified. ?Effectiveness: Clinically acceptable.  UDS:  ?Summary  ?Date Value Ref Range Status  ?02/18/2021 Note  Final  ?  Comment:  ?  ==================================================================== ?[employer] Select 13 (MW) ?==================================================================== ?Test                             Result       Flag       Units ? ?Drug Present and Declared for Prescription Verification ?  Hydrocodone                    1777         EXPECTED   ng/mg creat ?  Hydromorphone                  296          EXPECTED   ng/mg creat ?  Dihydrocodeine                 124          EXPECTED   ng/mg creat ?  Norhydrocodone                 923          EXPECTED   ng/mg creat ?   Sources of hydrocodone include scheduled prescription medications. ?  Hydromorphone, dihydrocodeine and norhydrocodone are expected ?   metabolites of hydrocodone. Hydromorphone and dihydrocodeine are ?   also available as scheduled prescription medications. ? ?==================================================================== ?Test                      Result    Flag   Units      Ref Range ?  Creatinine              70               mg/dL      >=20 ?==================================================================== ?Declared Medications: ? The flagging and interpretation on this report are based on the ? following declared medications.  Unexpected results may arise from ? inaccuracies in the declared medications. ? ? **Note: The testing scope of this panel includes these medications: ? ? Hydrocodone (Norco) ? ? **Note: The testing scope of  this panel does not include the ? following reported medications: ? ? Acetaminophen (Norco) ? Albuterol ? Alendronate (Fosamax) ? Aspirin ? Atorvastatin (Lipitor) ? Calcium ? Carvedilol (Coreg) ? Diclofenac (Voltaren) ? Hydrochlorothiazide ? Iron ? Isosorbide (Imdur) ? Olmesartan ? Omeprazole (Prilosec) ? Vitamin D ?==================================================================== ?For clinical consultation, please call [phone]. ?==================================================================== ?  ?  ? ? ?ROS  ?Constitutional: Denies any fever or chills ?Gastrointestinal: No reported hemesis, hematochezia, vomiting, or acute GI distress ?Musculoskeletal:  Low back, bilateral knee pain ?Neurological: No reported episodes of acute onset apraxia, aphasia, dysarthria, agnosia, amnesia, paralysis, loss of coordination, or loss of consciousness ? ?Medication Review  ?HYDROcodone-acetaminophen, Olmesartan-amLODIPine-HCTZ, albuterol, alendronate, aspirin EC, atorvastatin, calcium citrate-vitamin D, carvedilol, diclofenac Sodium, ferrous sulfate, isosorbide mononitrate, nitrofurantoin (macrocrystal-monohydrate), and omeprazole ? ?History Review  ?Allergy: Ms. Covington has No Known Allergies. ?Drug: Ms. Braman  reports no history of drug use. ?Alcohol:  reports no history of alcohol use. ?Tobacco:  reports that she has never smoked. She has never used smokeless tobacco. ?Social: Ms. Yim  reports that she has never smoked. She has never used smokeless tobacco. She reports that she does not drink alcohol and does not use drugs. ?Medical:  has a past medical history of Arthritis of both knees (08/01/2015), Colitis, Essential hypertension, benign (07/28/2014), GERD (gastroesophageal reflux disease), iron deficiency anemia (09/02/2015), Hyperlipidemia, Hypertension, Lumbar pain, Medicare annual wellness visit, subsequent (04/07/2017), OSA on CPAP (08/01/2015), Overweight (BMI 25.0-29.9) (07/10/2013), Prediabetes (09/02/2015),  and SOB (shortness of breath) (07/10/2013). ?Surgical: Ms. Moroney  has a past surgical history that includes Cataract extraction; Abdominal hysterectomy; Cholecystectomy; Lumbar disc surgery; and Colonoscopy with propofol (N/A, 04/09/2018). ?Family: family history includes Anuerysm in her son; Cancer in her daughter and father; Diabetes in her daughter and mother; Heart disease in her mother; Hypertension in her daughter. ? ?Laboratory Chemistry Profile  ? ?Renal ?Lab Results  ?Component Value Date  ? BUN 19 03/31/2021  ? CREATININE 0.78 03/31/2021  ? BCR NOT APPLICABLE 45/80/9983  ? GFRAA 78 08/01/2018  ? GFRNONAA >60 03/31/2021  ? ?  Hepatic ?Lab Results  ?Component Value Date  ? AST 24 03/31/2021  ? ALT 20 03/31/2021  ? ALBUMIN 4.0 03/31/2021  ? ALKPHOS 34 (L) 03/31/2021  ? ?  ?Electrolytes ?Lab Results  ?Component Value Date  ? NA 138 03/31/2021  ? K 3.8 03/31/2021  ? CL 105 03/31/2021  ? CALCIUM 9.2 03/31/2021  ? ?  Bone ?No results found for: [place], [id number], [id number], [id number], 25OHVITD1, 25OHVITD2, 25OHVITD3, TESTOFREE, TESTOSTERONE ?  ?Inflammation (CRP: Acute Phase) (ESR: Chronic Phase) ?No results found  for: CRP, ESRSEDRATE, LATICACIDVEN ?    ?Note: Above Lab results reviewed. ? ?Recent Imaging Review  ?DG BONE DENSITY (DXA) ?EXAM: ?DUAL X-RAY ABSORPTIOMETRY (DXA) FOR BONE MINERAL DENSITY ? ?IMPRESSION: ?Your patient Olisa Quesnel completed a BMD test on 05/12/2020 using ?the [employer] DXA System (software version: 14.10) manufactured by ?[employer]. The following summarizes the results of ?our evaluation. Technologist: SCE ?PATIENT BIOGRAPHICAL: ?Name: Leathia, Farnell ?Patient ID: [id number] Birth Date: March 08, 1931 Height: 61.5 in. ?Gender: Female Exam Date: 05/12/2020 Weight: 163.2 lbs. ?Indications: Advanced Age, Height Loss, History of Fracture (Adult), ?History of Spinal Surgery, Hysterectomy, Postmenopausal, Rheumatoid ?Arthritis Fractures: Left wrist Treatments: calcium w/ vit  D, ?Omeprazole ?DENSITOMETRY RESULTS: ?Site          Region      Measured ?Date Measured ?Age WHO ?Classification Young Adult ?T-score BMD         %Change ?vs. Previous Significant ?Change (*) ?DualFemur Total

## 2021-05-18 NOTE — Progress Notes (Signed)
Nursing Pain Medication Assessment:  ?Safety precautions to be maintained throughout the outpatient stay will include: orient to surroundings, keep bed in low position, maintain call bell within reach at all times, provide assistance with transfer out of bed and ambulation.  ?Medication Inspection Compliance: Pill count conducted under aseptic conditions, in front of the patient. Neither the pills nor the bottle was removed from the patient's sight at any time. Once count was completed pills were immediately returned to the patient in their original bottle. ? ?Medication: Hydrocodone/APAP ?Pill/Patch Count:  33 of 60 pills remain ?Pill/Patch Appearance: Markings consistent with prescribed medication ?Bottle Appearance: Standard pharmacy container. Clearly labeled. ?Filled Date: 4 / 3 / 2023 ?Last Medication intake:  Today ?

## 2021-05-20 ENCOUNTER — Encounter: Payer: Medicare Other | Admitting: Student in an Organized Health Care Education/Training Program

## 2021-08-10 ENCOUNTER — Encounter: Payer: Medicare Other | Admitting: Student in an Organized Health Care Education/Training Program

## 2021-08-26 ENCOUNTER — Encounter: Payer: Self-pay | Admitting: Student in an Organized Health Care Education/Training Program

## 2021-08-26 ENCOUNTER — Ambulatory Visit
Payer: Medicare Other | Attending: Student in an Organized Health Care Education/Training Program | Admitting: Student in an Organized Health Care Education/Training Program

## 2021-08-26 VITALS — BP 135/55 | HR 56 | Temp 97.7°F | Resp 16 | Ht 61.0 in | Wt 155.0 lb

## 2021-08-26 DIAGNOSIS — M533 Sacrococcygeal disorders, not elsewhere classified: Secondary | ICD-10-CM

## 2021-08-26 DIAGNOSIS — M5136 Other intervertebral disc degeneration, lumbar region: Secondary | ICD-10-CM

## 2021-08-26 DIAGNOSIS — G894 Chronic pain syndrome: Secondary | ICD-10-CM

## 2021-08-26 DIAGNOSIS — G8929 Other chronic pain: Secondary | ICD-10-CM | POA: Diagnosis present

## 2021-08-26 DIAGNOSIS — M47816 Spondylosis without myelopathy or radiculopathy, lumbar region: Secondary | ICD-10-CM | POA: Diagnosis present

## 2021-08-26 DIAGNOSIS — M1711 Unilateral primary osteoarthritis, right knee: Secondary | ICD-10-CM

## 2021-08-26 DIAGNOSIS — M170 Bilateral primary osteoarthritis of knee: Secondary | ICD-10-CM | POA: Diagnosis present

## 2021-08-26 DIAGNOSIS — M25562 Pain in left knee: Secondary | ICD-10-CM | POA: Diagnosis present

## 2021-08-26 DIAGNOSIS — M25561 Pain in right knee: Secondary | ICD-10-CM | POA: Diagnosis present

## 2021-08-26 DIAGNOSIS — M47818 Spondylosis without myelopathy or radiculopathy, sacral and sacrococcygeal region: Secondary | ICD-10-CM

## 2021-08-26 NOTE — Progress Notes (Signed)
PROVIDER NOTE: Information contained herein reflects review and annotations entered in association with encounter. Interpretation of such information and data should be left to medically-trained personnel. Information provided to patient can be located elsewhere in the medical record under "Patient Instructions". Document created using STT-dictation technology, any transcriptional errors that may result from process are unintentional.    Patient: Jocelyn [name]  Service Category: E/M  Provider: Gillis Santa, MD  DOB: 20-Dec-1931  DOS: 08/26/2021  Specialty: Interventional Pain Management  MRN: [id number]  Setting: Ambulatory outpatient  PCP: Jocelyn Figures, PA-C  Type: Established Patient    Referring Provider: Crissie Figures, PA-C  Location: Office  Delivery: Face-to-face     HPI  [name], a 88 y.o. year old female, is here today because of her Chronic pain syndrome [G89.4]. [name] primary complain today is Back Pain (lowerr) and Knee Pain (bilateral)  Last encounter: My last encounter with her was on 05/18/21  Pertinent problems: [name] has Lumbar degenerative disc disease; Hearing loss; Cerebrovascular accident (CVA) ([place]); Controlled substance agreement signed; Bilateral primary osteoarthritis of knee; Carpal tunnel syndrome on right; SI joint arthritis; Chronic pain of both knees; and Chronic pain syndrome on their pertinent problem list. Pain Assessment: Severity of   is reported as a 0-No pain (took med this am)/10. Location: Back  /denies. Onset: More than a month ago. Quality: Aching, Constant, Burning, Sharp, Discomfort. Timing: Constant. Modifying factor(s): meds, rest. Vitals:  height is _0  (1.549 m) and weight is 155 lb (70.3 kg). Her temperature is 97.7 F (36.5 C). Her blood pressure is 135/55 (abnormal) and her pulse is 56 (abnormal). Her respiration is 16 and oxygen saturation is 97%.   Reason for encounter: medication management.    Patient is  experiencing bilateral knee pain that is becoming more frequent and intense.  She previously had bilateral intra-articular knee steroid injection on 03/10/2021 that was helpful.  She states that the pain relief from the injections has worn off.  It provided approximately 75 to 80% pain relief for 4 months.  She would like to repeat these.  She does have prescriptions of hydrocodone awaiting at her pharmacy to be picked up whenever she runs out of her current prescription.  Patient endorsed understanding.  Follow-up within 1 week for knee injections.  Pharmacotherapy Assessment  Analgesic: Hydrocodone 5 mg BID prn #60/month   Monitoring: [place] PMP: PDMP reviewed during this encounter.       Pharmacotherapy: No side-effects or adverse reactions reported. Compliance: No problems identified. Effectiveness: Clinically acceptable.  UDS:  Summary  Date Value Ref Range Status  02/18/2021 Note  Final    Comment:    ==================================================================== [employer]ox[employer]SU[employer] Select 13 (MW) ==================================================================== Test                             Result       Flag       Units  Drug Present and Declared for Prescription Verification   Hydrocodone                    1777         EXPECTED   ng/mg creat   Hydromorphone                  296          EXPECTED   ng/mg creat   Dihydrocodeine  124          EXPECTED   ng/mg creat   Norhydrocodone                 923          EXPECTED   ng/mg creat    Sources of hydrocodone include scheduled prescription medications.    Hydromorphone, dihydrocodeine and norhydrocodone are expected    metabolites of hydrocodone. Hydromorphone and dihydrocodeine are    also available as scheduled prescription medications.  ==================================================================== Test                      Result    Flag   Units      Ref Range   Creatinine              70               mg/dL       >=20 ==================================================================== Declared Medications:  The flagging and interpretation on this report are based on the  following declared medications.  Unexpected results may arise from  inaccuracies in the declared medications.   **Note: The testing scope of this panel includes these medications:   Hydrocodone (Norco)   **Note: The testing scope of this panel does not include the  following reported medications:   Acetaminophen (Norco)  Albuterol  Alendronate (Fosamax)  Aspirin  Atorvastatin (Lipitor)  Calcium  Carvedilol (Coreg)  Diclofenac (Voltaren)  Hydrochlorothiazide  Iron  Isosorbide (Imdur)  Olmesartan  Omeprazole (Prilosec)  Vitamin D ==================================================================== For clinical consultation, please call [phone]. ====================================================================       ROS  Constitutional: Denies any fever or chills Gastrointestinal: No reported hemesis, hematochezia, vomiting, or acute GI distress Musculoskeletal:  Low back, bilateral knee pain Neurological: No reported episodes of acute onset apraxia, aphasia, dysarthria, agnosia, amnesia, paralysis, loss of coordination, or loss of consciousness  Medication Review  HYDROcodone-acetaminophen, Olmesartan-amLODIPine-HCTZ, albuterol, alendronate, aspirin EC, atorvastatin, calcium citrate-vitamin D, carvedilol, diclofenac Sodium, ferrous sulfate, isosorbide mononitrate, nitrofurantoin (macrocrystal-monohydrate), and omeprazole  History Review  Allergy: [name] has No Known Allergies. Drug: [name]  reports no history of drug use. Alcohol:  reports no history of alcohol use. Tobacco:  reports that she has never smoked. She has never used smokeless tobacco. Social: [name]  reports that she has never smoked. She has never used smokeless tobacco. She reports that she does not drink alcohol and does  not use drugs. Medical:  has a past medical history of Arthritis of both knees (08/01/2015), Colitis, Essential hypertension, benign (07/28/2014), GERD (gastroesophageal reflux disease), iron deficiency anemia (09/02/2015), Hyperlipidemia, Hypertension, Lumbar pain, Medicare annual wellness visit, subsequent (04/07/2017), OSA on CPAP (08/01/2015), Overweight (BMI 25.0-29.9) (07/10/2013), Prediabetes (09/02/2015), and SOB (shortness of breath) (07/10/2013). Surgical: [name]  has a past surgical history that includes Cataract extraction; Abdominal hysterectomy; Cholecystectomy; Lumbar disc surgery; and Colonoscopy with propofol (N/A, 04/09/2018). Family: family history includes Anuerysm in her son; Cancer in her daughter and father; Diabetes in her daughter and mother; Heart disease in her mother; Hypertension in her daughter.  Laboratory Chemistry Profile   Renal Lab Results  Component Value Date   BUN 19 03/31/2021   CREATININE 0.78 43/15/4008   BCR NOT APPLICABLE 67/61/9509   GFRAA 78 08/01/2018   GFRNONAA >60 03/31/2021     Hepatic Lab Results  Component Value Date   AST 24 03/31/2021   ALT 20 03/31/2021   ALBUMIN 4.0 03/31/2021   ALKPHOS 34 (L)  03/31/2021     Electrolytes Lab Results  Component Value Date   NA 138 03/31/2021   K 3.8 03/31/2021   CL 105 03/31/2021   CALCIUM 9.2 03/31/2021     Bone No results found for: "VD25OH", "VD125OH2TOT", "NW2956OZ3", "YQ6578IO9", "25OHVITD1", "25OHVITD2", "25OHVITD3", "TESTOFREE", "TESTOSTERONE"   Inflammation (CRP: Acute Phase) (ESR: Chronic Phase) No results found for: "CRP", "ESRSEDRATE", "LATICACIDVEN"     Note: Above Lab results reviewed.  Recent Imaging Review  DG BONE DENSITY (DXA) EXAM: DUAL X-RAY ABSORPTIOMETRY (DXA) FOR BONE MINERAL DENSITY  IMPRESSION: Your patient Jocelyn [name] completed a BMD test on 05/12/2020 using the [place] (software version: 14.10) manufactured by [employer]. The following  summarizes the results of our evaluation. Technologist: SCE PATIENT BIOGRAPHICAL: Name: Jocelyn [name], Jocelyn [name] Patient ID: [id number] Birth Date: [birth year] Height: 61.5 in. Gender: Female Exam Date: 05/12/2020 Weight: 163.2 lbs. Indications: Advanced Age, Height Loss, History of Fracture (Adult), History of Spinal Surgery, Hysterectomy, Postmenopausal, Rheumatoid Arthritis Fractures: Left wrist Treatments: calcium w/ vit D, Omeprazole DENSITOMETRY RESULTS: Site          Region      Measured Date Measured Age WHO Classification Young Adult T-score BMD         %Change vs. Previous Significant Change (*) DualFemur Total Right 05/12/2020 86.8 Osteoporosis -3.6 0.549 g/cm2 - -  Right Forearm Radius 33% 05/12/2020 86.8 Osteoporosis -4.1 0.518 g/cm2 - - ASSESSMENT:  The BMD measured at Forearm Radius 33% is 0.518 g/cm2 with a T-score of -4.1. This patient is considered osteoporotic according to [facility]) criteria. The scan quality is good. Lumbar spine was not utilized due to (advanced degenerative changes/scoliosis.  World Pharmacologist [facility]) criteria for post-menopausal, Caucasian Women: Normal:                   T-score at or above -1 SD Osteopenia/low bone mass: T-score between -1 and -2.5 SD Osteoporosis:             T-score at or below -2.5 SD  RECOMMENDATIONS:  1. All patients should optimize calcium and vitamin D intake. 2. Consider FDA-approved medical therapies in postmenopausal women and men aged 19 years and older, based on the following: a. A hip or vertebral(clinical or morphometric) fracture b. T-score < -2.5 at the femoral neck or spine after appropriate evaluation to exclude secondary causes c. Low bone mass (T-score between -1.0 and -2.5 at the femoral neck or spine) and a 10-year probability of a hip fracture > 3% or a 10-year probability of a major osteoporosis-related fracture > 20% based on the [place]-adapted WHO algorithm 3.  Clinician judgment and/or patient preferences may indicate treatment for people with 10-year fracture probabilities above or below these levels  FOLLOW-UP:  People with diagnosed cases of osteoporosis or at high risk for fracture should have regular bone mineral density tests. For patients eligible for Medicare, routine testing is allowed once every 2 years. The testing frequency can be increased to one year for patients who have rapidly progressing disease, those who are receiving or discontinuing medical therapy to restore bone mass, or have additional risk factors.  I have reviewed this report, and agree with the above findings.  [facility]  Electronically Signed   By: Rolm Baptise M.D.   On: 05/12/2020 12:57  Note: Reviewed        Physical Exam  General appearance: Well nourished, well developed, and well hydrated. In no apparent acute distress Mental  status: Alert, oriented x 3 (person, place, & time)       Respiratory: No evidence of acute respiratory distress Eyes: PERLA Vitals: BP (!) 135/55   Pulse (!) 56   Temp 97.7 F (36.5 C)   Resp 16   Ht _0  (1.549 m)   Wt 155 lb (70.3 kg)   SpO2 97%   BMI 29.29 kg/m  BMI: Estimated body mass index is 29.29 kg/m as calculated from the following:   Height as of this encounter: _1  (1.549 m).   Weight as of this encounter: 155 lb (70.3 kg). Ideal: Ideal body weight: 47.8 kg (105 lb 6.1 oz) Adjusted ideal body weight: 56.8 kg (125 lb 3.7 oz)  Patient presents today in wheelchair.  Low back pain, bilateral knee pain.  Assessment   Status Diagnosis  Controlled Having a Flare-up Having a Flare-up 1. Chronic pain syndrome   2. Bilateral primary osteoarthritis of knee   3. Primary osteoarthritis of right knee   4. SI joint arthritis   5. Lumbar facet arthropathy   6. Lumbar degenerative disc disease   7. Chronic pain of both knees   8. Chronic SI joint pain         Plan of Care  Jocelyn [name] has a current medication list which includes the following long-term medication(s): atorvastatin, calcium citrate-vitamin d, carvedilol, ferrous sulfate, isosorbide mononitrate, and olmesartan-amlodipine-hctz.  Pharmacotherapy (Medications Ordered): No orders of the defined types were placed in this encounter.  Orders Placed This Encounter  Procedures   KNEE INJECTION    Local Anesthetic & Steroid injection.    Standing Status:   Future    Standing Expiration Date:   11/26/2021    Scheduling Instructions:     Side: Bilateral     Sedation: None     Timeframe: As soon as schedule allows    Order Specific Question:   Where will this procedure be performed?    Answer:   [facility]      Follow-up plan:   Return in about 6 days (around 09/01/2021) for B/L IA knee steroid.   Recent Visits No visits were found meeting these conditions. Showing recent visits within past 90 days and meeting all other requirements Today's Visits Date Type Provider Dept  08/26/21 Office Visit Gillis Santa, MD [facility]  Showing today's visits and meeting all other requirements Future Appointments Date Type Provider Dept  09/01/21 Appointment Gillis Santa, MD [facility]  Showing future appointments within next 90 days and meeting all other requirements  I discussed the assessment and treatment plan with the patient. The patient was provided an opportunity to ask questions and all were answered. The patient agreed with the plan and demonstrated an understanding of the instructions.  Patient advised to call back or seek an in-person evaluation if the symptoms or condition worsens.  Duration of encounter: 63mnutes.  Note by: BGillis Santa MD Date: 08/26/2021; Time: 10:29 AM

## 2021-08-26 NOTE — Patient Instructions (Signed)
______________________________________________________________________  Preparing for your procedure (without sedation)  Procedure appointments are limited to planned procedures: No Prescription Refills. No disability issues will be discussed. No medication changes will be discussed.  Instructions: Food Intake: Avoid eating anything for at least 4 hours prior to your procedure. Transportation: Unless otherwise stated by your physician, bring a driver. Morning Medicines: Take all of your scheduled morning medications. If you take heart medicine, except for blood thinners, do not forget to take it the morning of the procedure. If your Diastolic (lower reading) is above 100 mmHg, elective cases will be cancelled/rescheduled. Blood thinners: These will need to be stopped for procedures. Notify our staff if you are taking any blood thinners. Depending on which one you take, there will be specific instructions on how and when to stop it. Diabetics on insulin: Notify the staff so that you can be scheduled 1st case in the morning. If your diabetes requires high dose insulin, take only  of your normal insulin dose the morning of the procedure and notify the staff that you have done so. Preventing infections: Shower with an antibacterial soap the morning of your procedure.  Build-up your immune system: Take 1000 mg of Vitamin C with every meal (3 times a day) the day prior to your procedure. Antibiotics: Inform the staff if you have a condition or reason that requires you to take antibiotics before dental procedures. Pregnancy: If you are pregnant, call and cancel the procedure. Sickness: If you have a cold, fever, or any active infections, call and cancel the procedure. Arrival: You must be in the facility at least 30 minutes prior to your scheduled procedure. Children: Do not bring any children with you. Dress appropriately: There is always a possibility that your clothing may get soiled. Valuables:  Do not bring any jewelry or valuables.  Reasons to call and reschedule or cancel your procedure: (Following these recommendations will minimize the risk of a serious complication.) Surgeries: Avoid having procedures within 2 weeks of any surgery. (Avoid for 2 weeks before or after any surgery). Flu Shots: Avoid having procedures within 2 weeks of a flu shots or . (Avoid for 2 weeks before or after immunizations). Barium: Avoid having a procedure within 7-10 days after having had a radiological study involving the use of radiological contrast. (Myelograms, Barium swallow or enema study). Heart attacks: Avoid any elective procedures or surgeries for the initial 6 months after a "Myocardial Infarction" (Heart Attack). Blood thinners: It is imperative that you stop these medications before procedures. Let us know if you if you take any blood thinner.  Infection: Avoid procedures during or within two weeks of an infection (including chest colds or gastrointestinal problems). Symptoms associated with infections include: Localized redness, fever, chills, night sweats or profuse sweating, burning sensation when voiding, cough, congestion, stuffiness, runny nose, sore throat, diarrhea, nausea, vomiting, cold or Flu symptoms, recent or current infections. It is specially important if the infection is over the area that we intend to treat. Heart and lung problems: Symptoms that may suggest an active cardiopulmonary problem include: cough, chest pain, breathing difficulties or shortness of breath, dizziness, ankle swelling, uncontrolled high or unusually low blood pressure, and/or palpitations. If you are experiencing any of these symptoms, cancel your procedure and contact your primary care physician for an evaluation.  Remember:  Regular Business hours are:  Monday to Thursday 8:00 AM to 4:00 PM  Provider's Schedule: Milinda Pointer, MD:  Procedure days: Tuesday and Thursday 7:30 AM to 4:00 PM  Gillis Santa, MD:  Procedure days: Monday and Wednesday 7:30 AM to 4:00 PM ______________________________________________________________________  Knee Injection A knee injection is a procedure to get medicine into your knee joint to relieve the pain, swelling, and stiffness of arthritis. Your health care provider uses a needle to inject medicine, which may also help to lubricate and cushion your knee joint. You may need more than one injection. Tell a health care provider about: Any allergies you have. All medicines you are taking, including vitamins, herbs, eye drops, creams, and over-the-counter medicines. Any problems you or family members have had with anesthetic medicines. Any blood disorders you have. Any surgeries you have had. Any medical conditions you have. Whether you are pregnant or may be pregnant. What are the risks? Generally, this is a safe procedure. However, problems may occur, including: Infection. Bleeding. Symptoms that get worse. Damage to the area around your knee. Allergic reaction to any of the medicines. Skin reactions from repeated injections. What happens before the procedure? Ask your health care provider about: Changing or stopping your regular medicines. This is especially important if you are taking diabetes medicines or blood thinners. Taking medicines such as aspirin and ibuprofen. These medicines can thin your blood. Do not take these medicines unless your health care provider tells you to take them. Taking over-the-counter medicines, vitamins, herbs, and supplements. Plan to have a responsible adult take you home from the hospital or clinic. What happens during the procedure?  You will sit or lie down in a position for your knee to be treated. The skin over your kneecap will be cleaned with a germ-killing soap. You will be given a medicine that numbs the area (local anesthetic). You may feel some stinging. The medicine will be injected into your knee.  The needle is carefully placed between your kneecap and your knee. The medicine is injected into the joint space. The needle will be removed at the end of the procedure. A bandage (dressing) may be placed over the injection site. The procedure may vary among health care providers and hospitals. What can I expect after the procedure? Your blood pressure, heart rate, breathing rate, and blood oxygen level will be monitored until you leave the hospital or clinic. You may have to move your knee through its full range of motion. This helps to get all the medicine into your joint space. You will be watched to make sure that you do not have a reaction to the injected medicine. You may feel more pain, swelling, and warmth than you did before the injection. This reaction may last about 1-2 days. Follow these instructions at home: Medicines Take over-the-counter and prescription medicines only as told by your health care provider. Ask your health care provider if the medicine prescribed to you requires you to avoid driving or using machinery. Do not take medicines such as aspirin and ibuprofen unless your health care provider tells you to take them. Injection site care Follow instructions from your health care provider about: How to take care of your puncture site. When and how you should change your dressing. When you should remove your dressing. Check your injection area every day for signs of infection. Check for: More redness, swelling, or pain after 2 days. Fluid or blood. Pus or a bad smell. Warmth. Managing pain, stiffness, and swelling  If directed, put ice on the injection area. To do this: Put ice in a plastic bag. Place a towel between your skin and the bag. Leave the ice  on for 20 minutes, 2-3 times per day. Remove the ice if your skin turns bright red. This is very important. If you cannot feel pain, heat, or cold, you have a greater risk of damage to the area. Do not apply heat to  your knee. Raise (elevate) the injection area above the level of your heart while you are sitting or lying down. General instructions If you were given a dressing, keep it dry until your health care provider says it can be removed. Ask your health care provider when you can start showering or bathing. Avoid strenuous activities for as long as directed by your health care provider. Ask your health care provider when you can return to your normal activities. Keep all follow-up visits. This is important. You may need more injections. Contact a health care provider if you have: A fever. Warmth in your injection area. Fluid, blood, or pus coming from your injection site. Symptoms at your injection site that last longer than 2 days after your procedure. Get help right away if: Your knee turns very red. Your knee becomes very swollen. Your knee is in severe pain. Summary A knee injection is a procedure to get medicine into your knee joint to relieve the pain, swelling, and stiffness of arthritis. A needle is carefully placed between your kneecap and your knee to inject medicine into the joint space. Before the procedure, ask your health care provider about changing or stopping your regular medicines, especially if you are taking diabetes medicines or blood thinners. Contact your health care provider if you have any problems or questions after your procedure. This information is not intended to replace advice given to you by your health care provider. Make sure you discuss any questions you have with your health care provider. Document Revised: 07/03/2019 Document Reviewed: 07/03/2019 [employer] Patient Education  [employer].

## 2021-08-26 NOTE — Progress Notes (Signed)
Nursing Pain Medication Assessment:  Safety precautions to be maintained throughout the outpatient stay will include: orient to surroundings, keep bed in low position, maintain call bell within reach at all times, provide assistance with transfer out of bed and ambulation.  Medication Inspection Compliance: Pill count conducted under aseptic conditions, in front of the patient. Neither the pills nor the bottle was removed from the patient's sight at any time. Once count was completed pills were immediately returned to the patient in their original bottle.  Medication: Hydrocodone/APAP Pill/Patch Count:  7 of 60 pills remain Pill/Patch Appearance: Markings consistent with prescribed medication Bottle Appearance: Standard pharmacy container. Clearly labeled. Filled Date: 6 / 20 / 2023 Last Medication intake:  Today

## 2021-09-01 ENCOUNTER — Encounter: Payer: Self-pay | Admitting: Student in an Organized Health Care Education/Training Program

## 2021-09-01 ENCOUNTER — Ambulatory Visit
Payer: Medicare Other | Attending: Student in an Organized Health Care Education/Training Program | Admitting: Student in an Organized Health Care Education/Training Program

## 2021-09-01 VITALS — BP 158/69 | HR 60 | Temp 97.2°F | Resp 16 | Ht 61.0 in | Wt 155.0 lb

## 2021-09-01 DIAGNOSIS — M170 Bilateral primary osteoarthritis of knee: Secondary | ICD-10-CM | POA: Insufficient documentation

## 2021-09-01 MED ORDER — ROPIVACAINE HCL 2 MG/ML IJ SOLN
INTRAMUSCULAR | Status: AC
  Filled 2021-09-01: qty 20

## 2021-09-01 MED ORDER — ROPIVACAINE HCL 2 MG/ML IJ SOLN
9.0000 mL | Freq: Once | INTRAMUSCULAR | Status: AC
  Administered 2021-09-01: 9 mL

## 2021-09-01 MED ORDER — METHYLPREDNISOLONE ACETATE 80 MG/ML IJ SUSP
80.0000 mg | Freq: Once | INTRAMUSCULAR | Status: AC
Start: 2021-09-01 — End: 2021-09-01
  Administered 2021-09-01: 80 mg via INTRA_ARTICULAR

## 2021-09-01 MED ORDER — LIDOCAINE HCL 2 % IJ SOLN
INTRAMUSCULAR | Status: AC
  Filled 2021-09-01: qty 20

## 2021-09-01 MED ORDER — LIDOCAINE HCL 2 % IJ SOLN
20.0000 mL | Freq: Once | INTRAMUSCULAR | Status: AC
  Administered 2021-09-01: 400 mg

## 2021-09-01 MED ORDER — METHYLPREDNISOLONE ACETATE 80 MG/ML IJ SUSP
INTRAMUSCULAR | Status: AC
  Filled 2021-09-01: qty 1

## 2021-09-01 NOTE — Progress Notes (Signed)
Safety precautions to be maintained throughout the outpatient stay will include: orient to surroundings, keep bed in low position, maintain call bell within reach at all times, provide assistance with transfer out of bed and ambulation.  

## 2021-09-01 NOTE — Progress Notes (Signed)
PROVIDER NOTE: Interpretation of information contained herein should be left to medically-trained personnel. Specific patient instructions are provided elsewhere under "Patient Instructions" section of medical record. This document was created in part using STT-dictation technology, any transcriptional errors that may result from this process are unintentional.  Patient: Jocelyn [name] Type: Established DOB: 1931-08-27 MRN: [id number] PCP: Crissie Figures, PA-C  Service: Procedure DOS: 09/01/2021 Setting: Ambulatory Location: Ambulatory outpatient facility Delivery: Face-to-face Provider: Gillis Santa, MD Specialty: Interventional Pain Management Specialty designation: 09 Location: Outpatient facility Ref. Prov.: Crissie Figures, PA-C    Primary Reason for Visit: Interventional Pain Management Treatment. CC: Knee Pain (bilat)   Procedure:           Type: Steroid Intra-articular Knee Injection Laterality: Bilateral (-50) Left side previously done 03/10/2021. Level/approach: Medial Imaging guidance: None required ([id number]) Anesthesia: Local anesthesia (1-2% Lidocaine)   Purpose: Diagnostic/Therapeutic Indications: Knee arthralgia associated to osteoarthritis of the knee 1. Bilateral primary osteoarthritis of knee    NAS-11 score:   Pre-procedure: 7 /10   Post-procedure: 3/10     Pre-Procedure Preparation  Monitoring: As per clinic protocol.  Risk Assessment: Vitals:  BJS:[id number] body mass index is 29.29 kg/m as calculated from the following:   Height as of this encounter: '5\' 1"'$  (1.549 m).   Weight as of this encounter: 155 lb (70.3 kg)., Rate:60 , BP:(!) 158/69, Resp:16, Temp:(!) 97.2 F (36.2 C), SpO2:99 %  Allergies: She has No Known Allergies.  Precautions: No additional precautions required  Blood-thinner(s): None at this time  Coagulopathies: Reviewed. None identified.   Active Infection(s): Reviewed. None identified. Jocelyn [name] is afebrile   Location setting:  Exam room Position: Sitting w/ knee bent 90 degrees Safety Precautions: Patient was assessed for positional comfort and pressure points before starting the procedure. Prepping solution: DuraPrep (Iodine Povacrylex [0.7% available iodine] and Isopropyl Alcohol, 74% w/w) Prep Area: Entire knee region Approach: percutaneous, just above the tibial plateau, lateral to the infrapatellar tendon. Intended target: Intra-articular knee space Materials: Tray: Block Needle(s): Regular Qty: 1/side Length: 1.5-inch Gauge: 25G  9 cc solution made of 8 cc of 0.2% ropivacaine, 1 cc of methylprednisolone, 80 mg/cc. 4.5 cc injected in each knee..    Meds ordered this encounter  Medications   methylPREDNISolone acetate (DEPO-MEDROL) injection 80 mg   ropivacaine (PF) 2 mg/mL (0.2%) (NAROPIN) injection 9 mL   lidocaine (XYLOCAINE) 2 % (with pres) injection 400 mg    No orders of the defined types were placed in this encounter.    Time-out: 1001 I initiated and conducted the "Time-out" before starting the procedure, as per protocol. The patient was asked to participate by confirming the accuracy of the "Time Out" information. Verification of the correct person, site, and procedure were performed and confirmed by me, the nursing staff, and the patient. "Time-out" conducted as per Joint Commission's Universal Protocol (UP.01.01.01). Procedure checklist: Completed   H&P (Pre-op  Assessment)  Jocelyn [name] is a 88 y.o. (year old), female patient, seen today for interventional treatment. She  has a past surgical history that includes Cataract extraction; Abdominal hysterectomy; Cholecystectomy; Lumbar disc surgery; and Colonoscopy with propofol (N/A, 04/09/2018). Jocelyn [name] has a current medication list which includes the following prescription(s): albuterol, alendronate, aspirin ec, atorvastatin, calcium citrate-vitamin d, carvedilol, ferrous sulfate, isosorbide mononitrate, macrobid, olmesartan-amlodipine-hctz,  omeprazole, and diclofenac sodium. Her primarily concern today is the Knee Pain (bilat)  She has No Known Allergies.   Last encounter: My last encounter with her was on 02/18/2021. Pertinent problems: Ms.  [name] has Lumbar degenerative disc disease; Hearing loss; Cerebrovascular accident (CVA) ([name]); Controlled substance agreement signed; Bilateral primary osteoarthritis of knee; Carpal tunnel syndrome on right; SI joint arthritis; Chronic pain of both knees; and Chronic pain syndrome on their pertinent problem list. Pain Assessment: Severity of Chronic pain is reported as a 7 /10. Location: Knee Right, Left/sometimes down side of left lower leg to calf. Onset:  . Quality: Aching, Constant, Burning, Sharp. Timing:  . Modifying factor(s): meds, rest. Vitals:  height is '5\' 1"'$  (1.549 m) and weight is 155 lb (70.3 kg). Her temporal temperature is 97.2 F (36.2 C) (abnormal). Her blood pressure is 158/69 (abnormal) and her pulse is 60. Her respiration is 16 and oxygen saturation is 99%.   Reason for encounter: "interventional pain management therapy due pain of at least four (4) weeks in duration, with failure to respond and/or inability to tolerate more conservative care.   Site Confirmation: Jocelyn [name] was asked to confirm the procedure and laterality before marking the site.  Consent: Before the procedure and under the influence of no sedative(s), amnesic(s), or anxiolytics, the patient was informed of the treatment options, risks and possible complications. To fulfill our ethical and legal obligations, as recommended by the American Medical Association's Code of Ethics, I have informed the patient of my clinical impression; the nature and purpose of the treatment or procedure; the risks, benefits, and possible complications of the intervention; the alternatives, including doing nothing; the risk(s) and benefit(s) of the alternative treatment(s) or procedure(s); and the risk(s) and benefit(s) of doing  nothing. The patient was provided information about the general risks and possible complications associated with the procedure. These may include, but are not limited to: failure to achieve desired goals, infection, bleeding, organ or nerve damage, allergic reactions, paralysis, and death. In addition, the patient was informed of those risks and complications associated to Spine-related procedures, such as failure to decrease pain; infection (i.e.: Meningitis, epidural or intraspinal abscess); bleeding (i.e.: epidural hematoma, subarachnoid hemorrhage, or any other type of intraspinal or peri-dural bleeding); organ or nerve damage (i.e.: Any type of peripheral nerve, nerve root, or spinal cord injury) with subsequent damage to sensory, motor, and/or autonomic systems, resulting in permanent pain, numbness, and/or weakness of one or several areas of the body; allergic reactions; (i.e.: anaphylactic reaction); and/or death. Furthermore, the patient was informed of those risks and complications associated with the medications. These include, but are not limited to: allergic reactions (i.e.: anaphylactic or anaphylactoid reaction(s)); adrenal axis suppression; blood sugar elevation that in diabetics may result in ketoacidosis or comma; water retention that in patients with history of congestive heart failure may result in shortness of breath, pulmonary edema, and decompensation with resultant heart failure; weight gain; swelling or edema; medication-induced neural toxicity; particulate matter embolism and blood vessel occlusion with resultant organ, and/or nervous system infarction; and/or aseptic necrosis of one or more joints. Finally, the patient was informed that Medicine is not an exact science; therefore, there is also the possibility of unforeseen or unpredictable risks and/or possible complications that may result in a catastrophic outcome. The patient indicated having understood very clearly. We have given  the patient no guarantees and we have made no promises. Enough time was given to the patient to ask questions, all of which were answered to the patient's satisfaction. Ms. Encarnacion has indicated that she wanted to continue with the procedure. Attestation: I, the ordering provider, attest that I have discussed with the patient the benefits, risks, side-effects, alternatives,  likelihood of achieving goals, and potential problems during recovery for the procedure that I have provided informed consent.  Date  Time: 09/01/2021  9:30 AM   Description of procedure   Start Time: 1001 hrs  Local Anesthesia: Once the patient was positioned, prepped, and time-out was completed. The target area was identified located. The skin was marked with an approved surgical skin marker. Once marked, the skin (epidermis, dermis, and hypodermis), and deeper tissues (fat, connective tissue and muscle) were infiltrated with a small amount of a short-acting local anesthetic, loaded on a 10cc syringe with a 25G, 1.5-in  Needle. An appropriate amount of time was allowed for local anesthetics to take effect before proceeding to the next step. Local Anesthetic: Lidocaine 1-2% The unused portion of the local anesthetic was discarded in the proper designated containers. Safety Precautions: Aspiration looking for blood return was conducted prior to all injections. At no point did I inject any substances, as a needle was being advanced. Before injecting, the patient was told to immediately notify me if she was experiencing any new onset of "ringing in the ears, or metallic taste in the mouth". No attempts were made at seeking any paresthesias. Safe injection practices and needle disposal techniques used. Medications properly checked for expiration dates. SDV (single dose vial) medications used. After the completion of the procedure, all disposable equipment used was discarded in the proper designated medical waste containers.  Technical  description: Protocol guidelines were followed. After positioning, the target area was identified and prepped in the usual manner. Skin & deeper tissues infiltrated with local anesthetic. Appropriate amount of time allowed to pass for local anesthetics to take effect. Proper needle placement secured. Once satisfactory needle placement was confirmed, I proceeded to inject the desired solution in slow, incremental fashion, intermittently assessing for discomfort or any signs of abnormal or undesired spread of substance. Once completed, the needle was removed and disposed of, as per hospital protocols. The area was cleaned, making sure to leave some of the prepping solution back to take advantage of its long term bactericidal properties.  Aspiration:  Negative   Vitals:   09/01/21 0936  BP: (!) 158/69  Pulse: 60  Resp: 16  Temp: (!) 97.2 F (36.2 C)  TempSrc: Temporal  SpO2: 99%  Weight: 155 lb (70.3 kg)  Height: '5\' 1"'$  (1.549 m)    End Time: 1006 hrs   Post-op assessment  Post-procedure Vital Signs:  Pulse/HCG Rate: 60  Temp: (!) 97.2 F (36.2 C) Resp: 16 BP: (!) 158/69 SpO2: 99 %  EBL: None  Complications: No immediate post-treatment complications observed by team, or reported by patient.  Note: The patient tolerated the entire procedure well. A repeat set of vitals were taken after the procedure and the patient was kept under observation following institutional policy, for this type of procedure. Post-procedural neurological assessment was performed, showing return to baseline, prior to discharge. The patient was provided with post-procedure discharge instructions, including a section on how to identify potential problems. Should any problems arise concerning this procedure, the patient was given instructions to immediately contact us, at any time, without hesitation. In any case, we plan to contact the patient by telephone for a follow-up status report regarding this interventional  procedure.  Comments:  No additional relevant information.   Plan of care  Chronic Opioid Analgesic:  Hydrocodone 5 mg BID prn #60/month   Medications administered: We administered methylPREDNISolone acetate, ropivacaine (PF) 2 mg/mL (0.2%), and lidocaine.    Requested Prescriptions  No prescriptions requested or ordered in this encounter     Follow-up plan:   Return in about 4 weeks (around 09/29/2021) for VV PP.     Recent Visits Date Type Provider Dept  08/26/21 Office Visit Gillis Santa, MD [facility]  Showing recent visits within past 90 days and meeting all other requirements Today's Visits Date Type Provider Dept  09/01/21 Procedure visit Gillis Santa, MD [facility]  Showing today's visits and meeting all other requirements Future Appointments Date Type Provider Dept  09/29/21 Appointment Gillis Santa, MD [facility]  Showing future appointments within next 90 days and meeting all other requirements   Disposition: Discharge home  Discharge (Date  Time): 09/01/2021; 1007 hrs.   Primary Care Physician: Crissie Figures, PA-C Location: [facility] Note by: Gillis Santa, MD Date: 09/01/2021; Time: 11:31 AM  DISCLAIMER: Medicine is not an exact science. It has no guarantees or warranties. The decision to proceed with this intervention was based on the information collected from the patient. Conclusions were drawn from the patient's questionnaire, interview, and examination. Because information was provided in large part by the patient, it cannot be guaranteed that it has not been purposely or unconsciously manipulated or altered. Every effort has been made to obtain as much accurate, relevant, available data as possible. Always take into account that the treatment will also be dependent on availability of resources and existing treatment guidelines, considered by other Pain Management Specialists as being common  knowledge and practice, at the time of the intervention. It is also important to point out that variation in procedural techniques and pharmacological choices are the acceptable norm. For Medico-Legal review purposes, the indications, contraindications, technique, and results of the these procedures should only be evaluated, judged and interpreted by a Board-Certified Interventional Pain Specialist with extensive familiarity and expertise in the same exact procedure and technique.

## 2021-09-02 ENCOUNTER — Telehealth: Payer: Self-pay

## 2021-09-02 NOTE — Telephone Encounter (Signed)
Post procedure phone call.  No answer or answering machine.

## 2021-09-29 ENCOUNTER — Telehealth: Payer: Medicare Other | Admitting: Student in an Organized Health Care Education/Training Program

## 2021-10-06 ENCOUNTER — Encounter: Payer: Self-pay | Admitting: Internal Medicine

## 2021-10-06 ENCOUNTER — Ambulatory Visit: Payer: Medicare Other | Attending: Internal Medicine | Admitting: Internal Medicine

## 2021-10-06 ENCOUNTER — Encounter: Payer: Self-pay | Admitting: Student in an Organized Health Care Education/Training Program

## 2021-10-06 ENCOUNTER — Telehealth: Payer: Self-pay | Admitting: *Deleted

## 2021-10-06 NOTE — Telephone Encounter (Signed)
Attempted to call for pre appointment review of allergies/meds. Spoke with daugher, [facility], please call again after 1330.

## 2021-10-06 NOTE — Progress Notes (Deleted)
Follow-up Outpatient Visit Date: 10/06/2021  Primary Care Provider: Crissie Figures, PA-C [place] [address]  Chief Complaint: ***  HPI:  Jocelyn [name] is a 88 y.o. female with history of hypertension, hyperlipidemia, stroke, prediabetes, obstructive sleep apnea on CPAP, and GERD, who presents for follow-up of dyspnea on exertion.  She was last seen in our office in March by Tarri Glenn, [place], at which time she reported stable exertional dyspnea and was otherwise feeling well.  Due to suboptimal BP control, carvedilol was increased to 6.25 mg twice daily.  --------------------------------------------------------------------------------------------------  Cardiovascular History & Procedures: Cardiovascular Problems: Stroke Dyspnea on exertion Stable angina   Risk Factors: Stroke, hypertension, hyperlipidemia, and prediabetes   Cath/PCI: None   CV Surgery: None   EP Procedures and Devices: None   Non-Invasive Evaluation(s): Pharmacologic MPI (12/12/2019): Low risk study without significant ischemia or scar.  LVEF 43% (likely artifactually low due to GI uptake).  Coronary artery and mitral annular calcifications present. TTE (11/01/2019): Normal LV size with severe LVH.  LVEF 60-65% with grade 1 diastolic dysfunction.  Normal RV size and function.  Moderate left atrial enlargement.  Mild mitral regurgitation with moderate annular calcification. Pharmacologic MPI (07/14/2010): Abnormal study with small, fixed apical defect.  LVEF 44% with global hypokinesis and more severe hypokinesis at the apex.  Recent CV Pertinent Labs: Lab Results  Component Value Date   CHOL 122 03/31/2021   CHOL 128 03/02/2020   HDL 56 03/31/2021   HDL 55 03/02/2020   LDLCALC 57 03/31/2021   LDLCALC 60 03/02/2020   LDLCALC 120 (H) 08/01/2018   LDLDIRECT 65 03/02/2020   TRIG 47 03/31/2021   CHOLHDL 2.2 03/31/2021   K 3.8 03/31/2021   K 3.4 (L) 04/13/2011   BUN 19 03/31/2021   BUN  24 07/08/2015   BUN 14 04/13/2011   CREATININE 0.78 03/31/2021   CREATININE 0.80 08/01/2018    Past medical and surgical history were reviewed and updated in EPIC.  No outpatient medications have been marked as taking for the 10/06/21 encounter (Appointment) with Kamran Coker, Harrell Gave, MD.    Allergies: Patient has no known allergies.  Social History   Tobacco Use   Smoking status: Never   Smokeless tobacco: Never  Vaping Use   Vaping Use: Never used  Substance Use Topics   Alcohol use: No    Alcohol/week: 0.0 standard drinks of alcohol   Drug use: No    Family History  Problem Relation Age of Onset   Hypertension Daughter    Diabetes Daughter    Cancer Daughter        breast   Diabetes Mother    Heart disease Mother    Cancer Father        lung; smoker   Anuerysm Son     Review of Systems: A 12-system review of systems was performed and was negative except as noted in the HPI.  --------------------------------------------------------------------------------------------------  Physical Exam: There were no vitals taken for this visit.  General:  NAD. Neck: No JVD or HJR. Lungs: Clear to auscultation bilaterally without wheezes or crackles. Heart: Regular rate and rhythm without murmurs, rubs, or gallops. Abdomen: Soft, nontender, nondistended. Extremities: No lower extremity edema.  EKG:  ***  Lab Results  Component Value Date   WBC 5.2 03/31/2021   HGB 11.1 (L) 03/31/2021   HCT 34.7 (L) 03/31/2021   MCV 86.1 03/31/2021   PLT 146 (L) 03/31/2021    Lab Results  Component Value Date  NA 138 03/31/2021   K 3.8 03/31/2021   CL 105 03/31/2021   CO2 26 03/31/2021   BUN 19 03/31/2021   CREATININE 0.78 03/31/2021   GLUCOSE 107 (H) 03/31/2021   ALT 20 03/31/2021    Lab Results  Component Value Date   CHOL 122 03/31/2021   HDL 56 03/31/2021   LDLCALC 57 03/31/2021   LDLDIRECT 65 03/02/2020   TRIG 47 03/31/2021   CHOLHDL 2.2 03/31/2021     --------------------------------------------------------------------------------------------------  ASSESSMENT AND PLAN: Harrell Gave Elazar Argabright, MD 10/06/2021 8:09 AM

## 2021-10-07 ENCOUNTER — Encounter: Payer: Self-pay | Admitting: Student in an Organized Health Care Education/Training Program

## 2021-10-07 ENCOUNTER — Ambulatory Visit
Payer: Medicare Other | Attending: Student in an Organized Health Care Education/Training Program | Admitting: Student in an Organized Health Care Education/Training Program

## 2021-10-07 DIAGNOSIS — M170 Bilateral primary osteoarthritis of knee: Secondary | ICD-10-CM | POA: Diagnosis not present

## 2021-10-07 DIAGNOSIS — M1711 Unilateral primary osteoarthritis, right knee: Secondary | ICD-10-CM | POA: Diagnosis not present

## 2021-10-07 DIAGNOSIS — G894 Chronic pain syndrome: Secondary | ICD-10-CM | POA: Diagnosis not present

## 2021-10-07 NOTE — Progress Notes (Signed)
A patient: Jocelyn [name]  Service Category: E/M  Provider: Gillis Santa, MD  DOB: [birth year]  DOS: 10/07/2021  Location: Office  MRN: [id number]  Setting: Ambulatory outpatient  Referring Provider: Crissie Figures, PA-C  Type: Established Patient  Specialty: Interventional Pain Management  PCP: Crissie Figures, PA-C  Location: Remote location  Delivery: TeleHealth     Virtual Encounter - Pain Management PROVIDER NOTE: Information contained herein reflects review and annotations entered in association with encounter. Interpretation of such information and data should be left to medically-trained personnel. Information provided to patient can be located elsewhere in the medical record under "Patient Instructions". Document created using STT-dictation technology, any transcriptional errors that may result from process are unintentional.    Contact & Pharmacy Preferred: [phone] Home: [phone] (home) Mobile: [phone] (mobile) E-mail: No e-mail address on record  [id number] [place] [address]: [phone]: [phone] [id number] [place] [address]: [phone]: [phone]  Pre-screening  [name] "in-person" vs "virtual" encounter. She indicated preferring virtual for this encounter.   Reason COVID-19*  Social distancing based on CDC and AMA recommendations.   I contacted [name] 10/07/2021 via telephone.      I clearly identified myself as BGillis Santa MD. I verified that I was speaking with the correct person using two identifiers (Name: Jocelyn [name] and date of birth: 507/30/1933.  Consent I sought verbal advanced consent from [name] virtual visit interactions. I informed Ms. FSoltof possible security and privacy  concerns, risks, and limitations associated with providing "not-in-person" medical evaluation and management services. I also informed Ms. FUrtonof the availability of "in-person" appointments. Finally, I informed her that there would be a charge for the virtual visit and that she could be  personally, fully or partially, financially responsible for it. Ms. FGoresexpressed understanding and agreed to proceed.   Historic Elements   Ms. FVIRIDIANA SPAIDis a 88y.o. year old, female patient evaluated today after our last contact on 09/01/2021. Ms. FLeoni has a past medical history of Arthritis of both knees (08/01/2015), Colitis, Essential hypertension, benign (07/28/2014), GERD (gastroesophageal reflux disease), iron deficiency anemia (09/02/2015), Hyperlipidemia, Hypertension, Lumbar pain, Medicare annual wellness visit, subsequent (04/07/2017), OSA on CPAP (08/01/2015), Overweight (BMI 25.0-29.9) (07/10/2013), Prediabetes (09/02/2015), and SOB (shortness of breath) (07/10/2013). She also  has a past surgical history that includes Cataract extraction; Abdominal hysterectomy; Cholecystectomy; Lumbar disc surgery; and Colonoscopy with propofol (N/A, 04/09/2018). Ms. FHayashihas a current medication list which includes the following prescription(s): albuterol, alendronate, aspirin ec, atorvastatin, calcium citrate-vitamin d, carvedilol, ferrous sulfate, isosorbide mononitrate, macrobid, olmesartan-amlodipine-hctz, omeprazole, and diclofenac sodium. She  reports that she has never smoked. She has never used smokeless tobacco. She reports that she does not drink alcohol and does not use drugs. Ms. FKesterhas No Known Allergies.   HPI  Today, she is being contacted for a post-procedure assessment.   Post-procedure evaluation   Type: Steroid Intra-articular Knee Injection Laterality: Bilateral (-50) Left side previously done 03/10/2021. Level/approach: Medial Imaging guidance: None required (CPT-20610) Anesthesia: Local  anesthesia (1-2% Lidocaine)   Purpose: Diagnostic/Therapeutic Indications: Knee arthralgia associated to osteoarthritis of the knee 1. Bilateral primary osteoarthritis of knee    NAS-11 score:   Pre-procedure: 7 /10  Post-procedure: 3/10     Effectiveness:  Initial hour after procedure: 100 %  Subsequent 4-6 hours post-procedure: 100 %  Analgesia past initial 6 hours: 75 %  Ongoing improvement:  Analgesic:  75% Function: Somewhat improved ROM: Somewhat improved   Pharmacotherapy Assessment   Opioid Analgesic: Hydrocodone 5 mg BID prn #60/month   Monitoring: [place] PMP: PDMP reviewed during this encounter.       Pharmacotherapy: No side-effects or adverse reactions reported. Compliance: No problems identified. Effectiveness: Clinically acceptable. Plan: Refer to "POC". UDS:  Summary  Date Value Ref Range Status  02/18/2021 Note  Final    Comment:    ==================================================================== [employer] Select 13 (MW) ==================================================================== Test                             Result       Flag       Units  Drug Present and Declared for Prescription Verification   Hydrocodone                    1777         EXPECTED   ng/mg creat   Hydromorphone                  296          EXPECTED   ng/mg creat   Dihydrocodeine                 124          EXPECTED   ng/mg creat   Norhydrocodone                 923          EXPECTED   ng/mg creat    Sources of hydrocodone include scheduled prescription medications.    Hydromorphone, dihydrocodeine and norhydrocodone are expected    metabolites of hydrocodone. Hydromorphone and dihydrocodeine are    also available as scheduled prescription medications.  ==================================================================== Test                      Result    Flag   Units      Ref Range   Creatinine              70               mg/dL       >=20 ==================================================================== Declared Medications:  The flagging and interpretation on this report are based on the  following declared medications.  Unexpected results may arise from  inaccuracies in the declared medications.   **Note: The testing scope of this panel includes these medications:   Hydrocodone (Norco)   **Note: The testing scope of this panel does not include the  following reported medications:   Acetaminophen (Norco)  Albuterol  Alendronate (Fosamax)  Aspirin  Atorvastatin (Lipitor)  Calcium  Carvedilol (Coreg)  Diclofenac (Voltaren)  Hydrochlorothiazide  Iron  Isosorbide (Imdur)  Olmesartan  Omeprazole (Prilosec)  Vitamin D ==================================================================== For clinical consultation, please call [phone]. ====================================================================    No results found for: "CBDTHCR", "D8THCCBX", "D9THCCBX"   Laboratory Chemistry Profile   Renal Lab Results  Component Value Date   BUN 19 03/31/2021   CREATININE 0.78 09/81/1914   BCR NOT APPLICABLE 78/29/5621   GFRAA 78 08/01/2018   GFRNONAA >60 03/31/2021    Hepatic Lab Results  Component Value Date   AST 24 03/31/2021  ALT 20 03/31/2021   ALBUMIN 4.0 03/31/2021   ALKPHOS 34 (L) 03/31/2021    Electrolytes Lab Results  Component Value Date   NA 138 03/31/2021   K 3.8 03/31/2021   CL 105 03/31/2021   CALCIUM 9.2 03/31/2021    Bone No results found for: "VD25OH", "VD125OH2TOT", "XA1287OM7", "EH2094BS9", "25OHVITD1", "25OHVITD2", "25OHVITD3", "TESTOFREE", "TESTOSTERONE"  Inflammation (CRP: Acute Phase) (ESR: Chronic Phase) No results found for: "CRP", "ESRSEDRATE", "LATICACIDVEN"       Note: Above Lab results reviewed.  Imaging  DG BONE DENSITY (DXA) EXAM: DUAL X-RAY ABSORPTIOMETRY (DXA) FOR BONE MINERAL DENSITY  IMPRESSION: Your patient Jayle Solarz completed a BMD  test on 05/12/2020 using the [employer] (software version: 14.10) manufactured by [employer]. The following summarizes the results of our evaluation. Technologist: SCE PATIENT BIOGRAPHICAL: Name: Payzlee, Ryder Patient ID: [id number] Birth Date: 1931-05-19 Height: 61.5 in. Gender: Female Exam Date: 05/12/2020 Weight: 163.2 lbs. Indications: Advanced Age, Height Loss, History of Fracture (Adult), History of Spinal Surgery, Hysterectomy, Postmenopausal, Rheumatoid Arthritis Fractures: Left wrist Treatments: calcium w/ vit D, Omeprazole DENSITOMETRY RESULTS: Site          Region      Measured Date Measured Age WHO Classification Young Adult T-score BMD         %Change vs. Previous Significant Change (*) DualFemur Total Right 05/12/2020 86.8 Osteoporosis -3.6 0.549 g/cm2 - -  Right Forearm Radius 33% 05/12/2020 86.8 Osteoporosis -4.1 0.518 g/cm2 - - ASSESSMENT:  The BMD measured at Forearm Radius 33% is 0.518 g/cm2 with a T-score of -4.1. This patient is considered osteoporotic according to [facility]) criteria. The scan quality is good. Lumbar spine was not utilized due to (advanced degenerative changes/scoliosis.  World Pharmacologist [facility]) criteria for post-menopausal, Caucasian Women: Normal:                   T-score at or above -1 SD Osteopenia/low bone mass: T-score between -1 and -2.5 SD Osteoporosis:             T-score at or below -2.5 SD  RECOMMENDATIONS:  1. All patients should optimize calcium and vitamin D intake. 2. Consider FDA-approved medical therapies in postmenopausal women and men aged 61 years and older, based on the following: a. A hip or vertebral(clinical or morphometric) fracture b. T-score < -2.5 at the femoral neck or spine after appropriate evaluation to exclude secondary causes c. Low bone mass (T-score between -1.0 and -2.5 at the femoral neck or spine) and a 10-year probability of a hip  fracture > 3% or a 10-year probability of a major osteoporosis-related fracture > 20% based on the [place]-adapted WHO algorithm 3. Clinician judgment and/or patient preferences may indicate treatment for people with 10-year fracture probabilities above or below these levels  FOLLOW-UP:  People with diagnosed cases of osteoporosis or at high risk for fracture should have regular bone mineral density tests. For patients eligible for Medicare, routine testing is allowed once every 2 years. The testing frequency can be increased to one year for patients who have rapidly progressing disease, those who are receiving or discontinuing medical therapy to restore bone mass, or have additional risk factors.  I have reviewed this report, and agree with the above findings.  [facility]  Electronically Signed   By: Rolm Baptise M.D.   On: 05/12/2020 12:57  Assessment  The primary encounter diagnosis was Bilateral primary osteoarthritis of knee. Diagnoses of Primary osteoarthritis of  right knee and Chronic pain syndrome were also pertinent to this visit.  Plan of Care  Significant improvement in bilateral knee pain after previous bilateral steroid injection.  Endorses analgesic and functional benefit.  We will continue to monitor.  Follow-up in 4 weeks for medication management    Follow-up plan:   Return in about 26 days (around 11/02/2021) for Medication Management, in person.    Recent Visits Date Type Provider Dept  09/01/21 Procedure visit Gillis Santa, MD [facility]  08/26/21 Office Visit Gillis Santa, MD [facility]  Showing recent visits within past 90 days and meeting all other requirements Today's Visits Date Type Provider Dept  10/07/21 Office Visit Gillis Santa, MD [facility]  Showing today's visits and meeting all other requirements Future Appointments No visits were found meeting these conditions. Showing future appointments  within next 90 days and meeting all other requirements  I discussed the assessment and treatment plan with the patient. The patient was provided an opportunity to ask questions and all were answered. The patient agreed with the plan and demonstrated an understanding of the instructions.  Patient advised to call back or seek an in-person evaluation if the symptoms or condition worsens.  Duration of encounter: 30mnutes.  Note by: BGillis Santa MD Date: 10/07/2021; Time: 3:29 PM

## 2021-10-13 ENCOUNTER — Other Ambulatory Visit: Payer: Self-pay | Admitting: Physician Assistant

## 2021-10-19 ENCOUNTER — Encounter: Payer: Medicare Other | Admitting: Student in an Organized Health Care Education/Training Program

## 2021-10-20 ENCOUNTER — Encounter: Payer: Self-pay | Admitting: Student in an Organized Health Care Education/Training Program

## 2021-10-20 ENCOUNTER — Ambulatory Visit
Payer: Medicare Other | Attending: Student in an Organized Health Care Education/Training Program | Admitting: Student in an Organized Health Care Education/Training Program

## 2021-10-20 VITALS — BP 146/60 | HR 55 | Temp 98.2°F | Resp 16 | Ht 61.0 in | Wt 155.0 lb

## 2021-10-20 DIAGNOSIS — G894 Chronic pain syndrome: Secondary | ICD-10-CM | POA: Diagnosis present

## 2021-10-20 DIAGNOSIS — M170 Bilateral primary osteoarthritis of knee: Secondary | ICD-10-CM | POA: Insufficient documentation

## 2021-10-20 DIAGNOSIS — M1711 Unilateral primary osteoarthritis, right knee: Secondary | ICD-10-CM | POA: Insufficient documentation

## 2021-10-20 MED ORDER — HYDROCODONE-ACETAMINOPHEN 5-325 MG PO TABS: 1.0000 | ORAL_TABLET | Freq: Two times a day (BID) | ORAL | 0 refills | Status: AC | PRN

## 2021-10-20 MED ORDER — HYDROCODONE-ACETAMINOPHEN 5-325 MG PO TABS: 1.0000 | ORAL_TABLET | Freq: Two times a day (BID) | ORAL | 0 refills | Status: DC | PRN

## 2021-10-20 NOTE — Progress Notes (Signed)
PROVIDER NOTE: Information contained herein reflects review and annotations entered in association with encounter. Interpretation of such information and data should be left to medically-trained personnel. Information provided to patient can be located elsewhere in the medical record under "Patient Instructions". Document created using STT-dictation technology, any transcriptional errors that may result from process are unintentional.    Patient: Jocelyn [name]  Service Category: E/M  Provider: Gillis Santa, MD  DOB: 1931/09/10  DOS: 10/20/2021  Specialty: Interventional Pain Management  MRN: [id number]  Setting: Ambulatory outpatient  PCP: Crissie Figures, PA-C  Type: Established Patient    Referring Provider: Crissie Figures, PA-C  Location: Office  Delivery: Face-to-face     HPI  Jocelyn [name], a 88 y.o. year old female, is here today because of her Bilateral primary osteoarthritis of knee [M17.0]. [name] primary complain today is Back Pain  Last encounter: My last encounter with her was on 10/07/21  Pertinent problems: Jocelyn [name] has Lumbar degenerative disc disease; Hearing loss; Cerebrovascular accident (CVA) ([place]); Controlled substance agreement signed; Bilateral primary osteoarthritis of knee; Carpal tunnel syndrome on right; SI joint arthritis; Chronic pain of both knees; and Chronic pain syndrome on their pertinent problem list. Pain Assessment: Severity of Acute pain is reported as a 5 /10. Location: Back Lower, Right, Left/denies today. Onset: More than a month ago. Quality: Aching, Constant, Sharp, Burning, Discomfort. Timing: Constant. Modifying factor(s): meds, rest. Vitals:  height is 5' 1"  (1.549 m) and weight is 155 lb (70.3 kg). Her temperature is 98.2 F (36.8 C). Her blood pressure is 146/60 (abnormal) and her pulse is 55 (abnormal). Her respiration is 16 and oxygen saturation is 99%.   Reason for encounter: medication management.    No change in medical history  since last visit.  Patient's pain is at baseline.  Patient continues multimodal pain regimen as prescribed.  States that it provides pain relief and improvement in functional status.  Still experiencing good pain relief from her bilateral knee steroid injection performed 09/01/2021.  Pharmacotherapy Assessment  Analgesic: Hydrocodone 5 mg BID prn #60/month   Monitoring: [place] PMP: PDMP reviewed during this encounter.       Pharmacotherapy: No side-effects or adverse reactions reported. Compliance: No problems identified. Effectiveness: Clinically acceptable.  UDS:  Summary  Date Value Ref Range Status  02/18/2021 Note  Final    Comment:    ==================================================================== [employer] Select 13 (MW) ==================================================================== Test                             Result       Flag       Units  Drug Present and Declared for Prescription Verification   Hydrocodone                    1777         EXPECTED   ng/mg creat   Hydromorphone                  296          EXPECTED   ng/mg creat   Dihydrocodeine                 124          EXPECTED   ng/mg creat   Norhydrocodone                 923  EXPECTED   ng/mg creat    Sources of hydrocodone include scheduled prescription medications.    Hydromorphone, dihydrocodeine and norhydrocodone are expected    metabolites of hydrocodone. Hydromorphone and dihydrocodeine are    also available as scheduled prescription medications.  ==================================================================== Test                      Result    Flag   Units      Ref Range   Creatinine              70               mg/dL      >=20 ==================================================================== Declared Medications:  The flagging and interpretation on this report are based on the  following declared medications.  Unexpected results may arise from  inaccuracies in the declared  medications.   **Note: The testing scope of this panel includes these medications:   Hydrocodone (Norco)   **Note: The testing scope of this panel does not include the  following reported medications:   Acetaminophen (Norco)  Albuterol  Alendronate (Fosamax)  Aspirin  Atorvastatin (Lipitor)  Calcium  Carvedilol (Coreg)  Diclofenac (Voltaren)  Hydrochlorothiazide  Iron  Isosorbide (Imdur)  Olmesartan  Omeprazole (Prilosec)  Vitamin D ==================================================================== For clinical consultation, please call [phone]. ====================================================================       ROS  Constitutional: Denies any fever or chills Gastrointestinal: No reported hemesis, hematochezia, vomiting, or acute GI distress Musculoskeletal:  Low back, pain Neurological: No reported episodes of acute onset apraxia, aphasia, dysarthria, agnosia, amnesia, paralysis, loss of coordination, or loss of consciousness  Medication Review  HYDROcodone-acetaminophen, Olmesartan-amLODIPine-HCTZ, albuterol, alendronate, aspirin EC, atorvastatin, calcium citrate-vitamin D, carvedilol, diclofenac Sodium, ferrous sulfate, isosorbide mononitrate, nitrofurantoin (macrocrystal-monohydrate), and omeprazole  History Review  Allergy: Jocelyn [name] has No Known Allergies. Drug: Jocelyn [name]  reports no history of drug use. Alcohol:  reports no history of alcohol use. Tobacco:  reports that she has never smoked. She has never used smokeless tobacco. Social: Jocelyn [name]  reports that she has never smoked. She has never used smokeless tobacco. She reports that she does not drink alcohol and does not use drugs. Medical:  has a past medical history of Arthritis of both knees (08/01/2015), Colitis, Essential hypertension, benign (07/28/2014), GERD (gastroesophageal reflux disease), iron deficiency anemia (09/02/2015), Hyperlipidemia, Hypertension, Lumbar pain, Medicare annual  wellness visit, subsequent (04/07/2017), OSA on CPAP (08/01/2015), Overweight (BMI 25.0-29.9) (07/10/2013), Prediabetes (09/02/2015), and SOB (shortness of breath) (07/10/2013). Surgical: Jocelyn [name]  has a past surgical history that includes Cataract extraction; Abdominal hysterectomy; Cholecystectomy; Lumbar disc surgery; and Colonoscopy with propofol (N/A, 04/09/2018). Family: family history includes Anuerysm in her son; Cancer in her daughter and father; Diabetes in her daughter and mother; Heart disease in her mother; Hypertension in her daughter.  Laboratory Chemistry Profile   Renal Lab Results  Component Value Date   BUN 19 03/31/2021   CREATININE 0.78 62/70/3500   BCR NOT APPLICABLE 93/81/8299   GFRAA 78 08/01/2018   GFRNONAA >60 03/31/2021     Hepatic Lab Results  Component Value Date   AST 24 03/31/2021   ALT 20 03/31/2021   ALBUMIN 4.0 03/31/2021   ALKPHOS 34 (L) 03/31/2021     Electrolytes Lab Results  Component Value Date   NA 138 03/31/2021   K 3.8 03/31/2021   CL 105 03/31/2021   CALCIUM 9.2 03/31/2021     Bone No results found for: "VD25OH", "VD125OH2TOT", "BZ1696VE9", "FY1017PZ0", "25OHVITD1", "  25OHVITD2", "25OHVITD3", "TESTOFREE", "TESTOSTERONE"   Inflammation (CRP: Acute Phase) (ESR: Chronic Phase) No results found for: "CRP", "ESRSEDRATE", "LATICACIDVEN"     Note: Above Lab results reviewed.  Recent Imaging Review  DG BONE DENSITY (DXA) EXAM: DUAL X-RAY ABSORPTIOMETRY (DXA) FOR BONE MINERAL DENSITY  IMPRESSION: Your patient Jocelyn [name] completed a BMD test on 05/12/2020 using the [facility] (software version: 14.10) manufactured by [employer]. The following summarizes the results of our evaluation. Technologist: SCE PATIENT BIOGRAPHICAL: Name: Jocelyn, [name] Patient ID: [id number] Birth Date: 1931/10/23 Height: 61.5 in. Gender: Female Exam Date: 05/12/2020 Weight: 163.2 lbs. Indications: Advanced Age, Height Loss, History  of Fracture (Adult), History of Spinal Surgery, Hysterectomy, Postmenopausal, Rheumatoid Arthritis Fractures: Left wrist Treatments: calcium w/ vit D, Omeprazole DENSITOMETRY RESULTS: Site          Region      Measured Date Measured Age WHO Classification Young Adult T-score BMD         %Change vs. Previous Significant Change (*) DualFemur Total Right 05/12/2020 86.8 Osteoporosis -3.6 0.549 g/cm2 - -  Right Forearm Radius 33% 05/12/2020 86.8 Osteoporosis -4.1 0.518 g/cm2 - - ASSESSMENT:  The BMD measured at Forearm Radius 33% is 0.518 g/cm2 with a T-score of -4.1. This patient is considered osteoporotic according to [facility]) criteria. The scan quality is good. Lumbar spine was not utilized due to (advanced degenerative changes/scoliosis.  World Pharmacologist [facility]) criteria for post-menopausal, Caucasian Women: Normal:                   T-score at or above -1 SD Osteopenia/low bone mass: T-score between -1 and -2.5 SD Osteoporosis:             T-score at or below -2.5 SD  RECOMMENDATIONS:  1. All patients should optimize calcium and vitamin D intake. 2. Consider FDA-approved medical therapies in postmenopausal women and men aged 23 years and older, based on the following: a. A hip or vertebral(clinical or morphometric) fracture b. T-score < -2.5 at the femoral neck or spine after appropriate evaluation to exclude secondary causes c. Low bone mass (T-score between -1.0 and -2.5 at the femoral neck or spine) and a 10-year probability of a hip fracture > 3% or a 10-year probability of a major osteoporosis-related fracture > 20% based on the [place]-adapted WHO algorithm 3. Clinician judgment and/or patient preferences may indicate treatment for people with 10-year fracture probabilities above or below these levels  FOLLOW-UP:  People with diagnosed cases of osteoporosis or at high risk for fracture should have regular bone mineral density tests.  For patients eligible for Medicare, routine testing is allowed once every 2 years. The testing frequency can be increased to one year for patients who have rapidly progressing disease, those who are receiving or discontinuing medical therapy to restore bone mass, or have additional risk factors.  I have reviewed this report, and agree with the above findings.  [facility]  Electronically Signed   By: Rolm Baptise M.D.   On: 05/12/2020 12:57  Note: Reviewed        Physical Exam  General appearance: Well nourished, well developed, and well hydrated. In no apparent acute distress Mental status: Alert, oriented x 3 (person, place, & time)       Respiratory: No evidence of acute respiratory distress Eyes: PERLA Vitals: BP (!) 146/60   Pulse (!) 55   Temp 98.2 F (36.8 C)   Resp 16  Ht 5' 1"  (1.549 m)   Wt 155 lb (70.3 kg)   SpO2 99%   BMI 29.29 kg/m  BMI: Estimated body mass index is 29.29 kg/m as calculated from the following:   Height as of this encounter: 5' 1"  (1.549 m).   Weight as of this encounter: 155 lb (70.3 kg). Ideal: Ideal body weight: 47.8 kg (105 lb 6.1 oz) Adjusted ideal body weight: 56.8 kg (125 lb 3.7 oz)  Patient presents today in wheelchair.  Low back pain,  Assessment   Status Diagnosis  Controlled Controlled Controlled 1. Bilateral primary osteoarthritis of knee   2. Primary osteoarthritis of right knee   3. Chronic pain syndrome         Plan of Care  Jocelyn [name] has a current medication list which includes the following long-term medication(s): atorvastatin, calcium citrate-vitamin d, carvedilol, ferrous sulfate, isosorbide mononitrate, and olmesartan-amlodipine-hctz.  Pharmacotherapy (Medications Ordered): Meds ordered this encounter  Medications   HYDROcodone-acetaminophen (NORCO/VICODIN) 5-325 MG tablet    Sig: Take 1 tablet by mouth every 12 (twelve) hours as needed for severe pain. Must last 30 days.     Dispense:  60 tablet    Refill:  0    Chronic Pain: STOP Act (Not applicable) Fill 1 day early if closed on refill date. Avoid benzodiazepines within 8 hours of opioids   HYDROcodone-acetaminophen (NORCO/VICODIN) 5-325 MG tablet    Sig: Take 1 tablet by mouth every 12 (twelve) hours as needed for severe pain. Must last 30 days.    Dispense:  60 tablet    Refill:  0    Chronic Pain: STOP Act (Not applicable) Fill 1 day early if closed on refill date. Avoid benzodiazepines within 8 hours of opioids   HYDROcodone-acetaminophen (NORCO/VICODIN) 5-325 MG tablet    Sig: Take 1 tablet by mouth every 12 (twelve) hours as needed for severe pain. Must last 30 days.    Dispense:  60 tablet    Refill:  0    Chronic Pain: STOP Act (Not applicable) Fill 1 day early if closed on refill date. Avoid benzodiazepines within 8 hours of opioids   No orders of the defined types were placed in this encounter.     Follow-up plan:   Return in about 3 months (around 02/03/2022) for Medication Management, in person.   Recent Visits Date Type Provider Dept  10/19/21 Appointment Gillis Santa, MD [facility]  10/07/21 Office Visit Gillis Santa, MD [facility]  09/01/21 Procedure visit Gillis Santa, MD [facility]  08/26/21 Office Visit Gillis Santa, MD [facility]  Showing recent visits within past 90 days and meeting all other requirements Today's Visits Date Type Provider Dept  10/20/21 Office Visit Gillis Santa, MD [facility]  Showing today's visits and meeting all other requirements Future Appointments No visits were found meeting these conditions. Showing future appointments within next 90 days and meeting all other requirements  I discussed the assessment and treatment plan with the patient. The patient was provided an opportunity to ask questions and all were answered. The patient agreed with the plan and demonstrated an understanding of the  instructions.  Patient advised to call back or seek an in-person evaluation if the symptoms or condition worsens.  Duration of encounter: 64mnutes.  Note by: BGillis Santa MD Date: 10/20/2021; Time: 10:43 AM

## 2021-10-20 NOTE — Patient Instructions (Signed)

## 2021-10-20 NOTE — Progress Notes (Signed)
Nursing Pain Medication Assessment:  Safety precautions to be maintained throughout the outpatient stay will include: orient to surroundings, keep bed in low position, maintain call bell within reach at all times, provide assistance with transfer out of bed and ambulation.  Medication Inspection Compliance: Pill count conducted under aseptic conditions, in front of the patient. Neither the pills nor the bottle was removed from the patient's sight at any time. Once count was completed pills were immediately returned to the patient in their original bottle.  Medication: See above Pill/Patch Count:  26 of 60 pills remain Pill/Patch Appearance: Markings consistent with prescribed medication Bottle Appearance: Standard pharmacy container. Clearly labeled. Filled Date: 8 / 20 / 2023 Last Medication intake:  Today

## 2021-10-27 ENCOUNTER — Ambulatory Visit: Payer: Medicare Other | Attending: Internal Medicine | Admitting: Internal Medicine

## 2021-10-27 ENCOUNTER — Encounter: Payer: Self-pay | Admitting: Internal Medicine

## 2021-10-27 VITALS — BP 120/52 | HR 59 | Ht 61.0 in | Wt 155.0 lb

## 2021-10-27 DIAGNOSIS — R0609 Other forms of dyspnea: Secondary | ICD-10-CM | POA: Diagnosis not present

## 2021-10-27 DIAGNOSIS — I208 Other forms of angina pectoris: Secondary | ICD-10-CM

## 2021-10-27 DIAGNOSIS — I700 Atherosclerosis of aorta: Secondary | ICD-10-CM

## 2021-10-27 DIAGNOSIS — I10 Essential (primary) hypertension: Secondary | ICD-10-CM | POA: Diagnosis not present

## 2021-10-27 DIAGNOSIS — E785 Hyperlipidemia, unspecified: Secondary | ICD-10-CM | POA: Diagnosis not present

## 2021-10-27 DIAGNOSIS — I2510 Atherosclerotic heart disease of native coronary artery without angina pectoris: Secondary | ICD-10-CM | POA: Insufficient documentation

## 2021-10-27 NOTE — Progress Notes (Signed)
Follow-up Outpatient Visit Date: 10/27/2021  Primary Care Provider: Crissie Figures, PA-C [place] [id number]  Chief Complaint: Follow-up hypertension and shortness of breath  HPI:  Jocelyn [name] is a 88 y.o. female with history of hypertension, hyperlipidemia, prediabetes, stroke, obstructive sleep apnea on CPAP, and GERD, who presents for follow-up of shortness of breath.  She was last seen in our office in March by Tarri Glenn, [place], at which time she reported stable dyspnea on exertion.  She denied chest pain.  Blood pressure was mildly elevated at 154/70, prompting escalation of carvedilol to 6.25 mg twice daily.  Today, Jocelyn [name] reports that she is feeling quite well though she is concerned about a COVID-19 exposure this past weekend.  She was around someone who later tested positive for COVID-19, though that individual was asymptomatic.  Jocelyn [name] got her COVID booster earlier this week as a precaution and has felt well other than some soreness at the injection site.  She denies chest pain, palpitations, lightheadedness, and edema.  Chronic exertional dyspnea/fatigue has been unchanged for more than a year.  --------------------------------------------------------------------------------------------------  Cardiovascular History & Procedures: Cardiovascular Problems: Stroke Dyspnea on exertion Coronary artery disease with stable angina   Risk Factors: Coronary artery calcification, stroke, hypertension, hyperlipidemia, and prediabetes   Cath/PCI: None   CV Surgery: None   EP Procedures and Devices: None   Non-Invasive Evaluation(s): Pharmacologic MPI (12/12/2019): Low risk study without evidence of ischemia or scar.  Mild coronary artery calcification noted as well as mitral annular calcification and aortic atherosclerosis.  LVEF 43% (likely depressed due to extracardiac activity). TTE (11/01/2019): Normal LV size with severe LVH.  LVEF 60-65% with grade  1 diastolic dysfunction.  Normal RV size and function.  Moderate left atrial enlargement.  Mild mitral regurgitation with moderate annular calcification. Pharmacologic MPI (07/14/2010): Abnormal study with small, fixed apical defect.  LVEF 44% with global hypokinesis and more severe hypokinesis at the apex.  Recent CV Pertinent Labs: Lab Results  Component Value Date   CHOL 122 03/31/2021   CHOL 128 03/02/2020   HDL 56 03/31/2021   HDL 55 03/02/2020   LDLCALC 57 03/31/2021   LDLCALC 60 03/02/2020   LDLCALC 120 (H) 08/01/2018   LDLDIRECT 65 03/02/2020   TRIG 47 03/31/2021   CHOLHDL 2.2 03/31/2021   K 3.8 03/31/2021   K 3.4 (L) 04/13/2011   BUN 19 03/31/2021   BUN 24 07/08/2015   BUN 14 04/13/2011   CREATININE 0.78 03/31/2021   CREATININE 0.80 08/01/2018    Past medical and surgical history were reviewed and updated in EPIC.  Current Meds  Medication Sig   albuterol (VENTOLIN HFA) 108 (90 Base) MCG/ACT inhaler Inhale 1 puff into the lungs as needed.   alendronate (FOSAMAX) 70 MG tablet Take 70 mg by mouth once a week.   aspirin EC 81 MG tablet Take 1 tablet (81 mg total) by mouth daily.   atorvastatin (LIPITOR) 40 MG tablet TAKE 1 TABLET (40 MG) BY MOUTH DAILY. PT NEEDS TO KEEP UPCOMING APPT IN AUG FOR FURTHER REFILLS   calcium citrate-vitamin D 500-400 MG-UNIT chewable tablet Chew 1 tablet by mouth 2 (two) times daily.    carvedilol (COREG) 6.25 MG tablet Take 1 tablet (6.25 mg total) by mouth 2 (two) times daily with a meal.   diclofenac Sodium (VOLTAREN) 1 % GEL Apply topically as needed.   ferrous sulfate 325 (65 FE) MG tablet Take 1 tablet (325 mg total) by mouth daily.   [  START ON 11/03/2021] HYDROcodone-acetaminophen (NORCO/VICODIN) 5-325 MG tablet Take 1 tablet by mouth every 12 (twelve) hours as needed for severe pain. Must last 30 days.   [START ON 12/03/2021] HYDROcodone-acetaminophen (NORCO/VICODIN) 5-325 MG tablet Take 1 tablet by mouth every 12 (twelve) hours as needed  for severe pain. Must last 30 days.   [START ON 01/02/2022] HYDROcodone-acetaminophen (NORCO/VICODIN) 5-325 MG tablet Take 1 tablet by mouth every 12 (twelve) hours as needed for severe pain. Must last 30 days.   isosorbide mononitrate (IMDUR) 120 MG 24 hr tablet TAKE 1 TABLET BY MOUTH EVERY DAY   Olmesartan-amLODIPine-HCTZ 40-10-12.5 MG TABS Take 1 tablet by mouth daily. For blood pressure   omeprazole (PRILOSEC) 20 MG capsule Take 20 mg by mouth daily.    Allergies: Patient has no known allergies.  Social History   Tobacco Use   Smoking status: Never   Smokeless tobacco: Never  Vaping Use   Vaping Use: Never used  Substance Use Topics   Alcohol use: No    Alcohol/week: 0.0 standard drinks of alcohol   Drug use: No    Family History  Problem Relation Age of Onset   Hypertension Daughter    Diabetes Daughter    Cancer Daughter        breast   Diabetes Mother    Heart disease Mother    Cancer Father        lung; smoker   Anuerysm Son     Review of Systems: A 12-system review of systems was performed and was negative except as noted in the HPI.  --------------------------------------------------------------------------------------------------  Physical Exam: BP (!) 120/52 (BP Location: Right Arm, Patient Position: Sitting, Cuff Size: Normal)   Pulse (!) 59   Ht '5\' 1"'$  (1.549 m)   Wt 155 lb (70.3 kg)   SpO2 96%   BMI 29.29 kg/m   General:  NAD. Neck: No JVD or HJR. Lungs: Clear to auscultation bilaterally without wheezes or crackles. Heart: Regular rate and rhythm without murmurs, rubs, or gallops. Abdomen: Soft, nontender, nondistended. Extremities: No lower extremity edema.  EKG: This bradycardia with poor R wave progression in V1 and V2 (suspect lead placement) and nonspecific T wave changes.  Poor R wave progression in V1 and V2 is new since 03/31/2021.  Nonspecific T wave changes are less pronounced.  Lab Results  Component Value Date   WBC 5.2 03/31/2021    HGB 11.1 (L) 03/31/2021   HCT 34.7 (L) 03/31/2021   MCV 86.1 03/31/2021   PLT 146 (L) 03/31/2021    Lab Results  Component Value Date   NA 138 03/31/2021   K 3.8 03/31/2021   CL 105 03/31/2021   CO2 26 03/31/2021   BUN 19 03/31/2021   CREATININE 0.78 03/31/2021   GLUCOSE 107 (H) 03/31/2021   ALT 20 03/31/2021    Lab Results  Component Value Date   CHOL 122 03/31/2021   HDL 56 03/31/2021   LDLCALC 57 03/31/2021   LDLDIRECT 65 03/02/2020   TRIG 47 03/31/2021   CHOLHDL 2.2 03/31/2021    --------------------------------------------------------------------------------------------------  ASSESSMENT AND PLAN: Dyspnea on exertion and stable angina: Fatigue and exertional dyspnea are unchanged for at least 12 months.  EKG today shows poor R wave progression in V1 and V2, which I suspect is driven by lead placement rather than interval septal MI.  Continue current antianginal therapy consisting of amlodipine, carvedilol, and isosorbide mononitrate.  Hypertension: Blood pressure well controlled today.  Continue current regimen of carvedilol, isosorbide mononitrate, and olmesartan-amlodipine-HCTZ.  Hyperlipidemia and aortic atherosclerosis: Lipids well controlled on last check in 03/2021.  Continue atorvastatin 40 mg daily.  Follow-up: Return to clinic in 6 months.  Nelva Bush, MD 10/27/2021 9:18 AM

## 2021-10-27 NOTE — Patient Instructions (Signed)
Medication Instructions:  - Your physician recommends that you continue on your current medications as directed. Please refer to the Current Medication list given to you today.  *If you need a refill on your cardiac medications before your next appointment, please call your pharmacy*   Lab Work: - none ordered  If you have labs (blood work) drawn today and your tests are completely normal, you will receive your results only by: [facility] (if you have MyChart) OR A paper copy in the mail If you have any lab test that is abnormal or we need to change your treatment, we will call you to review the results.   Testing/Procedures: - none ordered   Follow-Up: At [facility], you and your health needs are our priority.  As part of our continuing mission to provide you with exceptional heart care, we have created designated Provider Care Teams.  These Care Teams include your primary Cardiologist (physician) and Advanced Practice Providers (APPs -  Physician Assistants and Nurse Practitioners) who all work together to provide you with the care you need, when you need it.  We recommend signing up for the patient portal called "MyChart".  Sign up information is provided on this After Visit Summary.  MyChart is used to connect with patients for Virtual Visits (Telemedicine).  Patients are able to view lab/test results, encounter notes, upcoming appointments, etc.  Non-urgent messages can be sent to your provider as well.   To learn more about what you can do with MyChart, go to [email or web address].    Your next appointment:   6 month(s)  The format for your next appointment:   In Person  Provider:   You may see Nelva Bush, MD or one of the following Advanced Practice Providers on your designated Care Team:   Murray Hodgkins, NP Christell Faith, PA-C Cadence Kathlen Mody, PA-C Gerrie Nordmann, NP    Other Instructions N/a  Important Information About Sugar

## 2021-12-21 ENCOUNTER — Other Ambulatory Visit: Payer: Self-pay | Admitting: Internal Medicine

## 2022-01-07 ENCOUNTER — Other Ambulatory Visit: Payer: Self-pay | Admitting: Physician Assistant

## 2022-01-26 ENCOUNTER — Telehealth: Payer: Self-pay | Admitting: Internal Medicine

## 2022-01-26 MED ORDER — ISOSORBIDE MONONITRATE ER 120 MG PO TB24: 120.0000 mg | ORAL_TABLET | Freq: Every day | ORAL | 0 refills | Status: DC

## 2022-01-26 NOTE — Telephone Encounter (Signed)
 *  STAT* If patient is at the pharmacy, call can be transferred to refill team.   1. Which medications need to be refilled? (please list name of each medication and dose if known) isosorbide mononitrate (IMDUR) 120 MG 24 hr tablet   2. Which pharmacy/location (including street and city if local pharmacy) is medication to be sent to? [id number] [address] [place]  3. Do they need a 30 day or 90 day supply? 90 days

## 2022-01-27 ENCOUNTER — Encounter: Payer: Self-pay | Admitting: Student in an Organized Health Care Education/Training Program

## 2022-01-27 ENCOUNTER — Ambulatory Visit
Payer: Medicare Other | Attending: Student in an Organized Health Care Education/Training Program | Admitting: Student in an Organized Health Care Education/Training Program

## 2022-01-27 VITALS — BP 166/62 | HR 64 | Temp 98.4°F | Resp 16 | Ht 60.0 in | Wt 151.0 lb

## 2022-01-27 DIAGNOSIS — G894 Chronic pain syndrome: Secondary | ICD-10-CM | POA: Diagnosis present

## 2022-01-27 DIAGNOSIS — M47818 Spondylosis without myelopathy or radiculopathy, sacral and sacrococcygeal region: Secondary | ICD-10-CM | POA: Insufficient documentation

## 2022-01-27 DIAGNOSIS — M170 Bilateral primary osteoarthritis of knee: Secondary | ICD-10-CM | POA: Insufficient documentation

## 2022-01-27 DIAGNOSIS — M1711 Unilateral primary osteoarthritis, right knee: Secondary | ICD-10-CM | POA: Diagnosis not present

## 2022-01-27 DIAGNOSIS — M47816 Spondylosis without myelopathy or radiculopathy, lumbar region: Secondary | ICD-10-CM | POA: Insufficient documentation

## 2022-01-27 MED ORDER — HYDROCODONE-ACETAMINOPHEN 5-325 MG PO TABS: 1.0000 | ORAL_TABLET | Freq: Two times a day (BID) | ORAL | 0 refills | Status: AC | PRN

## 2022-01-27 NOTE — Progress Notes (Signed)
PROVIDER NOTE: Information contained herein reflects review and annotations entered in association with encounter. Interpretation of such information and data should be left to medically-trained personnel. Information provided to patient can be located elsewhere in the medical record under "Patient Instructions". Document created using STT-dictation technology, any transcriptional errors that may result from process are unintentional.    Patient: Jocelyn [name]  Service Category: E/M  Provider: Gillis Santa, MD  DOB: [birth year]  DOS: 01/27/2022  Specialty: Interventional Pain Management  MRN: [id number]  Setting: Ambulatory outpatient  PCP: Crissie Figures, PA-C  Type: Established Patient    Referring Provider: Crissie Figures, PA-C  Location: Office  Delivery: Face-to-face     HPI  [name], a 88 y.o. year old female, is here today because of her Bilateral primary osteoarthritis of knee [M17.0]. Ms. Grassi primary complain today is Back Pain (Lumbar bilateral ) and Knee Pain (Bilateral )  Last encounter: My last encounter with her was on 10/20/21  Pertinent problems: Ms. Kaylor has Lumbar degenerative disc disease; Hearing loss; Cerebrovascular accident (CVA) ([place]); Controlled substance agreement signed; Bilateral primary osteoarthritis of knee; Carpal tunnel syndrome on right; SI joint arthritis; Chronic pain of both knees; and Chronic pain syndrome on their pertinent problem list. Pain Assessment: Severity of Chronic pain is reported as a 5 /10. Location: Back (bilateral knee) Lower, Left, Right/denies. Onset: More than a month ago. Quality: Discomfort, Constant. Timing: Constant. Modifying factor(s): procedure helped her knees, medications. Vitals:  height is 5' (1.524 m) and weight is 151 lb (68.5 kg). Her temporal temperature is 98.4 F (36.9 C). Her blood pressure is 166/62 (abnormal) and her pulse is 64. Her respiration is 16 and oxygen saturation is 98%.   Reason for  encounter: medication management.    No change in medical history since last visit.  Patient's pain is at baseline.  Patient continues multimodal pain regimen as prescribed.  States that it provides pain relief and improvement in functional status.  Still experiencing good pain relief from her bilateral knee steroid injection performed 09/01/2021. Will continue to monitor  No issues with constipation.  Pharmacotherapy Assessment  Analgesic: Hydrocodone 5 mg BID prn #60/month   Monitoring: [place] PMP: PDMP reviewed during this encounter.       Pharmacotherapy: No side-effects or adverse reactions reported. Compliance: No problems identified. Effectiveness: Clinically acceptable.  UDS:  Summary  Date Value Ref Range Status  02/18/2021 Note  Final    Comment:    ==================================================================== [employer] Select 13 (MW) ==================================================================== Test                             Result       Flag       Units  Drug Present and Declared for Prescription Verification   Hydrocodone                    1777         EXPECTED   ng/mg creat   Hydromorphone                  296          EXPECTED   ng/mg creat   Dihydrocodeine                 124          EXPECTED   ng/mg creat   Norhydrocodone  923          EXPECTED   ng/mg creat    Sources of hydrocodone include scheduled prescription medications.    Hydromorphone, dihydrocodeine and norhydrocodone are expected    metabolites of hydrocodone. Hydromorphone and dihydrocodeine are    also available as scheduled prescription medications.  ==================================================================== Test                      Result    Flag   Units      Ref Range   Creatinine              70               mg/dL      >=20 ==================================================================== Declared Medications:  The flagging and interpretation on this  report are based on the  following declared medications.  Unexpected results may arise from  inaccuracies in the declared medications.   **Note: The testing scope of this panel includes these medications:   Hydrocodone (Norco)   **Note: The testing scope of this panel does not include the  following reported medications:   Acetaminophen (Norco)  Albuterol  Alendronate (Fosamax)  Aspirin  Atorvastatin (Lipitor)  Calcium  Carvedilol (Coreg)  Diclofenac (Voltaren)  Hydrochlorothiazide  Iron  Isosorbide (Imdur)  Olmesartan  Omeprazole (Prilosec)  Vitamin D ==================================================================== For clinical consultation, please call [phone]. ====================================================================       ROS  Constitutional: Denies any fever or chills Gastrointestinal: No reported hemesis, hematochezia, vomiting, or acute GI distress Musculoskeletal:  Low back, pain Neurological: No reported episodes of acute onset apraxia, aphasia, dysarthria, agnosia, amnesia, paralysis, loss of coordination, or loss of consciousness  Medication Review  HYDROcodone-acetaminophen, Olmesartan-amLODIPine-HCTZ, albuterol, alendronate, aspirin EC, atorvastatin, calcium citrate-vitamin D, carvedilol, diclofenac Sodium, ferrous sulfate, isosorbide mononitrate, and omeprazole  History Review  Allergy: Ms. Harshbarger has No Known Allergies. Drug: Ms. Monsour  reports no history of drug use. Alcohol:  reports no history of alcohol use. Tobacco:  reports that she has never smoked. She has never used smokeless tobacco. Social: Ms. Savell  reports that she has never smoked. She has never used smokeless tobacco. She reports that she does not drink alcohol and does not use drugs. Medical:  has a past medical history of Arthritis of both knees (08/01/2015), Colitis, Essential hypertension, benign (07/28/2014), GERD (gastroesophageal reflux disease), iron deficiency  anemia (09/02/2015), Hyperlipidemia, Hypertension, Lumbar pain, Medicare annual wellness visit, subsequent (04/07/2017), OSA on CPAP (08/01/2015), Overweight (BMI 25.0-29.9) (07/10/2013), Prediabetes (09/02/2015), and SOB (shortness of breath) (07/10/2013). Surgical: Ms. Halperin  has a past surgical history that includes Cataract extraction; Abdominal hysterectomy; Cholecystectomy; Lumbar disc surgery; and Colonoscopy with propofol (N/A, 04/09/2018). Family: family history includes Anuerysm in her son; Cancer in her daughter and father; Diabetes in her daughter and mother; Heart disease in her mother; Hypertension in her daughter.  Laboratory Chemistry Profile   Renal Lab Results  Component Value Date   BUN 19 03/31/2021   CREATININE 0.78 24/49/7530   BCR NOT APPLICABLE 06/10/209   GFRAA 78 08/01/2018   GFRNONAA >60 03/31/2021     Hepatic Lab Results  Component Value Date   AST 24 03/31/2021   ALT 20 03/31/2021   ALBUMIN 4.0 03/31/2021   ALKPHOS 34 (L) 03/31/2021     Electrolytes Lab Results  Component Value Date   NA 138 03/31/2021   K 3.8 03/31/2021   CL 105 03/31/2021   CALCIUM 9.2 03/31/2021     Bone No  results found for: "VD25OH", "VD125OH2TOT", "PO2423NT6", "RW4315QM0", "25OHVITD1", "25OHVITD2", "25OHVITD3", "TESTOFREE", "TESTOSTERONE"   Inflammation (CRP: Acute Phase) (ESR: Chronic Phase) No results found for: "CRP", "ESRSEDRATE", "LATICACIDVEN"     Note: Above Lab results reviewed.  Recent Imaging Review  DG BONE DENSITY (DXA) EXAM: DUAL X-RAY ABSORPTIOMETRY (DXA) FOR BONE MINERAL DENSITY  IMPRESSION: Your patient Legacy Carrender completed a BMD test on 05/12/2020 using the [employer] (software version: 14.10) manufactured by [employer]. The following summarizes the results of our evaluation. Technologist: SCE PATIENT BIOGRAPHICAL: Name: Bani, Gianfrancesco Patient ID: [id number] Birth Date: 08/15/1931 Height: 61.5 in. Gender: Female Exam Date:  05/12/2020 Weight: 163.2 lbs. Indications: Advanced Age, Height Loss, History of Fracture (Adult), History of Spinal Surgery, Hysterectomy, Postmenopausal, Rheumatoid Arthritis Fractures: Left wrist Treatments: calcium w/ vit D, Omeprazole DENSITOMETRY RESULTS: Site          Region      Measured Date Measured Age WHO Classification Young Adult T-score BMD         %Change vs. Previous Significant Change (*) DualFemur Total Right 05/12/2020 86.8 Osteoporosis -3.6 0.549 g/cm2 - -  Right Forearm Radius 33% 05/12/2020 86.8 Osteoporosis -4.1 0.518 g/cm2 - - ASSESSMENT:  The BMD measured at Forearm Radius 33% is 0.518 g/cm2 with a T-score of -4.1. This patient is considered osteoporotic according to [facility]) criteria. The scan quality is good. Lumbar spine was not utilized due to (advanced degenerative changes/scoliosis.  World Pharmacologist [facility]) criteria for post-menopausal, Caucasian Women: Normal:                   T-score at or above -1 SD Osteopenia/low bone mass: T-score between -1 and -2.5 SD Osteoporosis:             T-score at or below -2.5 SD  RECOMMENDATIONS:  1. All patients should optimize calcium and vitamin D intake. 2. Consider FDA-approved medical therapies in postmenopausal women and men aged 10 years and older, based on the following: a. A hip or vertebral(clinical or morphometric) fracture b. T-score < -2.5 at the femoral neck or spine after appropriate evaluation to exclude secondary causes c. Low bone mass (T-score between -1.0 and -2.5 at the femoral neck or spine) and a 10-year probability of a hip fracture > 3% or a 10-year probability of a major osteoporosis-related fracture > 20% based on the [place]-adapted WHO algorithm 3. Clinician judgment and/or patient preferences may indicate treatment for people with 10-year fracture probabilities above or below these levels  FOLLOW-UP:  People with diagnosed cases of osteoporosis  or at high risk for fracture should have regular bone mineral density tests. For patients eligible for Medicare, routine testing is allowed once every 2 years. The testing frequency can be increased to one year for patients who have rapidly progressing disease, those who are receiving or discontinuing medical therapy to restore bone mass, or have additional risk factors.  I have reviewed this report, and agree with the above findings.  [facility]  Electronically Signed   By: Rolm Baptise M.D.   On: 05/12/2020 12:57  Note: Reviewed        Physical Exam  General appearance: Well nourished, well developed, and well hydrated. In no apparent acute distress Mental status: Alert, oriented x 3 (person, place, & time)       Respiratory: No evidence of acute respiratory distress Eyes: PERLA Vitals: BP (!) 166/62 (BP Location: Right Arm, Patient Position: Sitting, Cuff Size: Normal)  Pulse 64   Temp 98.4 F (36.9 C) (Temporal)   Resp 16   Ht 5' (1.524 m)   Wt 151 lb (68.5 kg)   SpO2 98%   BMI 29.49 kg/m  BMI: Estimated body mass index is 29.49 kg/m as calculated from the following:   Height as of this encounter: 5' (1.524 m).   Weight as of this encounter: 151 lb (68.5 kg). Ideal: Ideal body weight: 45.5 kg (100 lb 4.9 oz) Adjusted ideal body weight: 54.7 kg (120 lb 9.4 oz)  Patient presents today in wheelchair.  Low back pain,  Assessment   Status Diagnosis  Controlled Controlled Controlled 1. Bilateral primary osteoarthritis of knee   2. Primary osteoarthritis of right knee   3. SI joint arthritis   4. Lumbar facet arthropathy   5. Chronic pain syndrome         Plan of Care  Ms. BRUNETTA NEWINGHAM has a current medication list which includes the following long-term medication(s): atorvastatin, calcium citrate-vitamin d, carvedilol, ferrous sulfate, isosorbide mononitrate, and olmesartan-amlodipine-hctz.  Pharmacotherapy (Medications Ordered): Meds  ordered this encounter  Medications   HYDROcodone-acetaminophen (NORCO/VICODIN) 5-325 MG tablet    Sig: Take 1 tablet by mouth every 12 (twelve) hours as needed for severe pain. Must last 30 days.    Dispense:  60 tablet    Refill:  0    Chronic Pain: STOP Act (Not applicable) Fill 1 day early if closed on refill date. Avoid benzodiazepines within 8 hours of opioids   HYDROcodone-acetaminophen (NORCO/VICODIN) 5-325 MG tablet    Sig: Take 1 tablet by mouth every 12 (twelve) hours as needed for severe pain. Must last 30 days.    Dispense:  60 tablet    Refill:  0    Chronic Pain: STOP Act (Not applicable) Fill 1 day early if closed on refill date. Avoid benzodiazepines within 8 hours of opioids   HYDROcodone-acetaminophen (NORCO/VICODIN) 5-325 MG tablet    Sig: Take 1 tablet by mouth every 12 (twelve) hours as needed for severe pain. Must last 30 days.    Dispense:  60 tablet    Refill:  0    Chronic Pain: STOP Act (Not applicable) Fill 1 day early if closed on refill date. Avoid benzodiazepines within 8 hours of opioids   No orders of the defined types were placed in this encounter.     Follow-up plan:   Return in about 3 months (around 04/28/2022) for Medication Management, in person.   Recent Visits No visits were found meeting these conditions. Showing recent visits within past 90 days and meeting all other requirements Today's Visits Date Type Provider Dept  01/27/22 Office Visit Gillis Santa, MD [facility]  Showing today's visits and meeting all other requirements Future Appointments No visits were found meeting these conditions. Showing future appointments within next 90 days and meeting all other requirements  I discussed the assessment and treatment plan with the patient. The patient was provided an opportunity to ask questions and all were answered. The patient agreed with the plan and demonstrated an understanding of the instructions.  Patient advised to call  back or seek an in-person evaluation if the symptoms or condition worsens.  Duration of encounter: 55mnutes.  Note by: BGillis Santa MD Date: 01/27/2022; Time: 9:52 AM

## 2022-01-27 NOTE — Progress Notes (Signed)
Nursing Pain Medication Assessment:  Safety precautions to be maintained throughout the outpatient stay will include: orient to surroundings, keep bed in low position, maintain call bell within reach at all times, provide assistance with transfer out of bed and ambulation.  Medication Inspection Compliance: Pill count conducted under aseptic conditions, in front of the patient. Neither the pills nor the bottle was removed from the patient's sight at any time. Once count was completed pills were immediately returned to the patient in their original bottle.  Medication: Hydrocodone/APAP Pill/Patch Count:  4 of 60 pills remain Pill/Patch Appearance: Markings consistent with prescribed medication Bottle Appearance: Standard pharmacy container. Clearly labeled. Filled Date: 48 / 17 / 2023 Last Medication intake:  Today

## 2022-01-28 ENCOUNTER — Other Ambulatory Visit: Payer: Self-pay | Admitting: Nurse Practitioner

## 2022-01-28 DIAGNOSIS — Z1231 Encounter for screening mammogram for malignant neoplasm of breast: Secondary | ICD-10-CM

## 2022-02-14 ENCOUNTER — Other Ambulatory Visit: Payer: Self-pay | Admitting: Nurse Practitioner

## 2022-02-14 ENCOUNTER — Ambulatory Visit
Admission: RE | Admit: 2022-02-14 | Discharge: 2022-02-14 | Disposition: A | Discharge status: Home / Self Care | Payer: 59 | Source: Ambulatory Visit | Attending: Nurse Practitioner | Admitting: Nurse Practitioner

## 2022-02-14 ENCOUNTER — Ambulatory Visit
Admission: RE | Admit: 2022-02-14 | Discharge: 2022-02-14 | Disposition: A | Discharge status: Home / Self Care | Payer: 59 | Attending: Nurse Practitioner | Admitting: Nurse Practitioner

## 2022-02-14 DIAGNOSIS — I700 Atherosclerosis of aorta: Secondary | ICD-10-CM | POA: Diagnosis not present

## 2022-02-14 DIAGNOSIS — I517 Cardiomegaly: Secondary | ICD-10-CM | POA: Diagnosis not present

## 2022-02-14 DIAGNOSIS — R634 Abnormal weight loss: Secondary | ICD-10-CM

## 2022-02-18 ENCOUNTER — Other Ambulatory Visit: Payer: Self-pay | Admitting: Nurse Practitioner

## 2022-02-18 DIAGNOSIS — R634 Abnormal weight loss: Secondary | ICD-10-CM

## 2022-03-01 ENCOUNTER — Ambulatory Visit
Admission: RE | Admit: 2022-03-01 | Discharge: 2022-03-01 | Disposition: A | Discharge status: Home / Self Care | Payer: 59 | Source: Ambulatory Visit | Attending: Nurse Practitioner | Admitting: Nurse Practitioner

## 2022-03-01 DIAGNOSIS — R634 Abnormal weight loss: Secondary | ICD-10-CM | POA: Insufficient documentation

## 2022-03-01 DIAGNOSIS — Z1231 Encounter for screening mammogram for malignant neoplasm of breast: Secondary | ICD-10-CM | POA: Diagnosis present

## 2022-03-09 ENCOUNTER — Other Ambulatory Visit: Payer: Self-pay | Admitting: Medical

## 2022-03-09 ENCOUNTER — Other Ambulatory Visit: Payer: Self-pay | Admitting: Internal Medicine

## 2022-04-20 ENCOUNTER — Ambulatory Visit: Payer: 59 | Attending: Internal Medicine | Admitting: Internal Medicine

## 2022-04-20 ENCOUNTER — Encounter: Payer: Self-pay | Admitting: Internal Medicine

## 2022-04-20 VITALS — BP 130/60 | HR 55 | Ht 61.0 in | Wt 150.0 lb

## 2022-04-20 DIAGNOSIS — I700 Atherosclerosis of aorta: Secondary | ICD-10-CM

## 2022-04-20 DIAGNOSIS — I10 Essential (primary) hypertension: Secondary | ICD-10-CM | POA: Diagnosis not present

## 2022-04-20 DIAGNOSIS — I25118 Atherosclerotic heart disease of native coronary artery with other forms of angina pectoris: Secondary | ICD-10-CM

## 2022-04-20 DIAGNOSIS — E785 Hyperlipidemia, unspecified: Secondary | ICD-10-CM | POA: Diagnosis not present

## 2022-04-20 DIAGNOSIS — I498 Other specified cardiac arrhythmias: Secondary | ICD-10-CM

## 2022-04-20 NOTE — Patient Instructions (Addendum)
Medication Instructions:  Your physician recommends the following medication changes.  STOP TAKING: Carvedilol 6.25 mg   *If you need a refill on your cardiac medications before your next appointment, please call your pharmacy*   Lab Work: None ordered today   Testing/Procedures: None ordered today   Follow-Up: At [facility], you and your health needs are our priority.  As part of our continuing mission to provide you with exceptional heart care, we have created designated Provider Care Teams.  These Care Teams include your primary Cardiologist (physician) and Advanced Practice Providers (APPs -  Physician Assistants and Nurse Practitioners) who all work together to provide you with the care you need, when you need it.  We recommend signing up for the patient portal called "MyChart".  Sign up information is provided on this After Visit Summary.  MyChart is used to connect with patients for Virtual Visits (Telemedicine).  Patients are able to view lab/test results, encounter notes, upcoming appointments, etc.  Non-urgent messages can be sent to your provider as well.   To learn more about what you can do with MyChart, go to [email or web address].    Your next appointment:   3 month(s)  Provider:   You may see Nelva Bush, MD or one of the following Advanced Practice Providers on your designated Care Team:   Murray Hodgkins, NP Christell Faith, PA-C Cadence Kathlen Mody, PA-C Gerrie Nordmann, NP

## 2022-04-20 NOTE — Progress Notes (Unsigned)
Follow-up Outpatient Visit Date: 04/20/2022  Primary Care Provider: Crissie Figures, PA-C [place] [id number]  Chief Complaint: Follow-up hypertension and coronary artery disease  HPI:  Jocelyn [name] is a 89 y.o. female with history of coronary artery disease, hypertension, hyperlipidemia, stroke, prediabetes, obstructive sleep apnea on CPAP, and GERD, who presents for follow-up of shortness of breath.  I last saw her in 10/2021, at which time she was feeling well, though she was concerned that she may have been exposed to COVID-19.  We did not make any medication changes or pursue additional testing.  Today, Jocelyn [name] reports that she has been feeling well from a heart standpoint without chest pain, shortness of breath, palpitations, lightheadedness, or edema.  She notes a mechanical fall since our last visit; she lost her balance and fell against the wall.  She had pain with abduction of the right arm, which has since resolved.  She remains compliant with medications and denies side-effects.  --------------------------------------------------------------------------------------------------  Cardiovascular History & Procedures: Cardiovascular Problems: Stroke Dyspnea on exertion Coronary artery disease with stable angina   Risk Factors: Coronary artery calcification, stroke, hypertension, hyperlipidemia, and prediabetes   Cath/PCI: None   CV Surgery: None   EP Procedures and Devices: None   Non-Invasive Evaluation(s): Pharmacologic MPI (12/12/2019): Low risk study without evidence of ischemia or scar.  Mild coronary artery calcification noted as well as mitral annular calcification and aortic atherosclerosis.  LVEF 43% (likely depressed due to extracardiac activity). TTE (11/01/2019): Normal LV size with severe LVH.  LVEF 60-65% with grade 1 diastolic dysfunction.  Normal RV size and function.  Moderate left atrial enlargement.  Mild mitral regurgitation with  moderate annular calcification. Pharmacologic MPI (07/14/2010): Abnormal study with small, fixed apical defect.  LVEF 44% with global hypokinesis and more severe hypokinesis at the apex.  Recent CV Pertinent Labs: Lab Results  Component Value Date   CHOL 122 03/31/2021   CHOL 128 03/02/2020   HDL 56 03/31/2021   HDL 55 03/02/2020   LDLCALC 57 03/31/2021   LDLCALC 60 03/02/2020   LDLCALC 120 (H) 08/01/2018   LDLDIRECT 65 03/02/2020   TRIG 47 03/31/2021   CHOLHDL 2.2 03/31/2021   K 3.8 03/31/2021   K 3.4 (L) 04/13/2011   BUN 19 03/31/2021   BUN 24 07/08/2015   BUN 14 04/13/2011   CREATININE 0.78 03/31/2021   CREATININE 0.80 08/01/2018    Past medical and surgical history were reviewed and updated in EPIC.  Current Meds  Medication Sig   albuterol (VENTOLIN HFA) 108 (90 Base) MCG/ACT inhaler Inhale 1 puff into the lungs as needed.   alendronate (FOSAMAX) 70 MG tablet Take 70 mg by mouth once a week.   aspirin EC 81 MG tablet Take 1 tablet (81 mg total) by mouth daily.   atorvastatin (LIPITOR) 40 MG tablet TAKE 1 TABLET BY MOUTH EVERY DAY   calcium citrate-vitamin D 500-400 MG-UNIT chewable tablet Chew 1 tablet by mouth 2 (two) times daily.    carvedilol (COREG) 6.25 MG tablet Take 1 tablet (6.25 mg total) by mouth 2 (two) times daily with a meal.   diclofenac Sodium (VOLTAREN) 1 % GEL Apply topically as needed.   ferrous sulfate 325 (65 FE) MG tablet Take 1 tablet (325 mg total) by mouth daily.   HYDROcodone-acetaminophen (NORCO/VICODIN) 5-325 MG tablet Take 1 tablet by mouth every 12 (twelve) hours as needed for severe pain. Must last 30 days.   isosorbide mononitrate (IMDUR) 120 MG 24 hr  tablet Take 1 tablet (120 mg total) by mouth daily.   Olmesartan-amLODIPine-HCTZ 40-10-12.5 MG TABS Take 1 tablet by mouth daily. For blood pressure   omeprazole (PRILOSEC) 20 MG capsule Take 20 mg by mouth daily.    Allergies: Patient has no known allergies.  Social History   Tobacco Use    Smoking status: Never   Smokeless tobacco: Never  Vaping Use   Vaping Use: Never used  Substance Use Topics   Alcohol use: No    Alcohol/week: 0.0 standard drinks of alcohol   Drug use: No    Family History  Problem Relation Age of Onset   Diabetes Mother    Heart disease Mother    Cancer Father        lung; smoker   Hypertension Daughter    Diabetes Daughter    Cancer Daughter        breast   Anuerysm Son    Breast cancer Neg Hx     Review of Systems: A 12-system review of systems was performed and was negative except as noted in the HPI.  --------------------------------------------------------------------------------------------------  Physical Exam: BP (!) 116/50 (BP Location: Right Arm, Patient Position: Sitting, Cuff Size: Normal)   Pulse (!) 55   Ht 5\' 1"  (1.549 m)   Wt 150 lb (68 kg)   SpO2 97%   BMI 28.34 kg/m  Repeat BP: 130/60 (right arm)  General:  NAD. Neck: No JVD or HJR. Lungs: Clear to auscultation bilaterally without wheezes or crackles. Heart: Bradycardic but regular with 1/6 systolic murmur.  No rubs or gallops. Abdomen: Soft, nontender, nondistended. Extremities: No lower extremity edema.  EKG:  Junctional rhythm versus ectopic atrial rhythm with bradycardia, PAC's, LVH, non-specific T wave changes, and possible septal infarct.  PAC's are now present; otherwise no significant change since 10/27/2021.  Lab Results  Component Value Date   WBC 5.2 03/31/2021   HGB 11.1 (L) 03/31/2021   HCT 34.7 (L) 03/31/2021   MCV 86.1 03/31/2021   PLT 146 (L) 03/31/2021    Lab Results  Component Value Date   NA 138 03/31/2021   K 3.8 03/31/2021   CL 105 03/31/2021   CO2 26 03/31/2021   BUN 19 03/31/2021   CREATININE 0.78 03/31/2021   GLUCOSE 107 (H) 03/31/2021   ALT 20 03/31/2021    Lab Results  Component Value Date   CHOL 122 03/31/2021   HDL 56 03/31/2021   LDLCALC 57 03/31/2021   LDLDIRECT 65 03/02/2020   TRIG 47 03/31/2021   CHOLHDL  2.2 03/31/2021    --------------------------------------------------------------------------------------------------  ASSESSMENT AND PLAN: Coronary artery disease with stable angina, aortic atherosclerosis, and hyperlipidemia: No chest pain or dyspnea reported.  We have agreed to hold carvedilol in the setting of bradycardia today with concern for junction or ectopic atrial rhythm..  Continue isosorbide mononitrate, aspirin, and atorvastatin.  LDL well-controlled.  Junctional versus ectopic atrial rhythm: Asymptomatic.  We will discontinue carvedilol with plans to repeat EKG at follow-up in 3 months.  Hypertension: Blood pressure reasonable.  Hold carvedilol as above; continue remainder of medications.  Follow-up: Return to clinic in 3 months.  Nelva Bush, MD 04/20/2022 9:42 AM

## 2022-04-21 ENCOUNTER — Encounter: Payer: Self-pay | Admitting: Internal Medicine

## 2022-04-21 ENCOUNTER — Encounter: Payer: Self-pay | Admitting: Student in an Organized Health Care Education/Training Program

## 2022-04-21 ENCOUNTER — Ambulatory Visit
Payer: 59 | Attending: Student in an Organized Health Care Education/Training Program | Admitting: Student in an Organized Health Care Education/Training Program

## 2022-04-21 VITALS — BP 124/51 | HR 61 | Temp 97.2°F | Resp 16 | Ht 61.0 in | Wt 150.0 lb

## 2022-04-21 DIAGNOSIS — M1711 Unilateral primary osteoarthritis, right knee: Secondary | ICD-10-CM | POA: Insufficient documentation

## 2022-04-21 DIAGNOSIS — I25118 Atherosclerotic heart disease of native coronary artery with other forms of angina pectoris: Secondary | ICD-10-CM | POA: Insufficient documentation

## 2022-04-21 DIAGNOSIS — M170 Bilateral primary osteoarthritis of knee: Secondary | ICD-10-CM | POA: Diagnosis not present

## 2022-04-21 DIAGNOSIS — M47816 Spondylosis without myelopathy or radiculopathy, lumbar region: Secondary | ICD-10-CM | POA: Diagnosis not present

## 2022-04-21 DIAGNOSIS — M47818 Spondylosis without myelopathy or radiculopathy, sacral and sacrococcygeal region: Secondary | ICD-10-CM | POA: Diagnosis not present

## 2022-04-21 DIAGNOSIS — G894 Chronic pain syndrome: Secondary | ICD-10-CM | POA: Diagnosis present

## 2022-04-21 MED ORDER — HYDROCODONE-ACETAMINOPHEN 5-325 MG PO TABS: 1.0000 | ORAL_TABLET | Freq: Two times a day (BID) | ORAL | 0 refills | Status: AC | PRN

## 2022-04-21 NOTE — Patient Instructions (Signed)

## 2022-04-21 NOTE — Progress Notes (Signed)
PROVIDER NOTE: Information contained herein reflects review and annotations entered in association with encounter. Interpretation of such information and data should be left to medically-trained personnel. Information provided to patient can be located elsewhere in the medical record under "Patient Instructions". Document created using STT-dictation technology, any transcriptional errors that may result from process are unintentional.    Patient: Jocelyn [name]  Service Category: E/M  Provider: Gillis Santa, MD  DOB: 01/29/1932  DOS: 04/21/2022  Specialty: Interventional Pain Management  MRN: [id number]  Setting: Ambulatory outpatient  PCP: Jocelyn Figures, PA-C  Type: Established Patient    Referring Provider: Crissie Figures, PA-C  Location: Office  Delivery: Face-to-face     HPI  [name], a 89 y.o. year old female, is here today because of her Bilateral primary osteoarthritis of knee [M17.0]. [name] primary complain today is right shoulder pain  Last encounter: My last encounter with her was on 01/27/22  Pertinent problems: [name] has Lumbar degenerative disc disease; Hearing loss; Cerebrovascular accident (CVA) ([place]); Controlled substance agreement signed; Bilateral primary osteoarthritis of knee; Carpal tunnel syndrome on right; SI joint arthritis; Chronic pain of both knees; and Chronic pain syndrome on their pertinent problem list. Pain Assessment: Severity of Chronic pain is reported as a 0-No pain/10. Location: Back Lower, Mid/denies. Onset: More than a month ago. Quality: Discomfort, Aching. Timing: Constant. Modifying factor(s): rest makes it better and medications. Vitals:  height is 5\' 1"  (1.549 m) and weight is 150 lb (68 kg). Her temporal temperature is 97.2 F (36.2 C) (abnormal). Her blood pressure is 124/51 (abnormal) and her pulse is 61. Her respiration is 16 and oxygen saturation is 97%.   Reason for encounter: medication management.    No change in  medical history since last visit.  Patient's pain is at baseline.  Patient continues multimodal pain regimen as prescribed.  States that it provides pain relief and improvement in functional status.  Right shoulder pain due to hitting right shoulder against wall from slipping on rug  No issues with constipation.  Pharmacotherapy Assessment  Analgesic: Hydrocodone 5 mg BID prn #60/month   Monitoring: [place] PMP: PDMP reviewed during this encounter.       Pharmacotherapy: No side-effects or adverse reactions reported. Compliance: No problems identified. Effectiveness: Clinically acceptable.  UDS:  Summary  Date Value Ref Range Status  02/18/2021 Note  Final    Comment:    ==================================================================== [employer] Select 13 (MW) ==================================================================== Test                             Result       Flag       Units  Drug Present and Declared for Prescription Verification   Hydrocodone                    1777         EXPECTED   ng/mg creat   Hydromorphone                  296          EXPECTED   ng/mg creat   Dihydrocodeine                 124          EXPECTED   ng/mg creat   Norhydrocodone                 923  EXPECTED   ng/mg creat    Sources of hydrocodone include scheduled prescription medications.    Hydromorphone, dihydrocodeine and norhydrocodone are expected    metabolites of hydrocodone. Hydromorphone and dihydrocodeine are    also available as scheduled prescription medications.  ==================================================================== Test                      Result    Flag   Units      Ref Range   Creatinine              70               mg/dL      >=20 ==================================================================== Declared Medications:  The flagging and interpretation on this report are based on the  following declared medications.  Unexpected results may arise  from  inaccuracies in the declared medications.   **Note: The testing scope of this panel includes these medications:   Hydrocodone (Norco)   **Note: The testing scope of this panel does not include the  following reported medications:   Acetaminophen (Norco)  Albuterol  Alendronate (Fosamax)  Aspirin  Atorvastatin (Lipitor)  Calcium  Carvedilol (Coreg)  Diclofenac (Voltaren)  Hydrochlorothiazide  Iron  Isosorbide (Imdur)  Olmesartan  Omeprazole (Prilosec)  Vitamin D ==================================================================== For clinical consultation, please call [phone]. ====================================================================       ROS  Constitutional: Denies any fever or chills Gastrointestinal: No reported hemesis, hematochezia, vomiting, or acute GI distress Musculoskeletal:  Low back & right shoulder pain Neurological: No reported episodes of acute onset apraxia, aphasia, dysarthria, agnosia, amnesia, paralysis, loss of coordination, or loss of consciousness  Medication Review  HYDROcodone-acetaminophen, Olmesartan-amLODIPine-HCTZ, albuterol, alendronate, aspirin EC, atorvastatin, calcium citrate-vitamin D, diclofenac Sodium, ferrous sulfate, isosorbide mononitrate, and omeprazole  History Review  Allergy: [name] has No Known Allergies. Drug: [name]  reports no history of drug use. Alcohol:  reports no history of alcohol use. Tobacco:  reports that she has never smoked. She has never used smokeless tobacco. Social: [name]  reports that she has never smoked. She has never used smokeless tobacco. She reports that she does not drink alcohol and does not use drugs. Medical:  has a past medical history of Arthritis of both knees (08/01/2015), Colitis, Essential hypertension, benign (07/28/2014), GERD (gastroesophageal reflux disease), iron deficiency anemia (09/02/2015), Hyperlipidemia, Hypertension, Lumbar pain, Medicare annual  wellness visit, subsequent (04/07/2017), OSA on CPAP (08/01/2015), Overweight (BMI 25.0-29.9) (07/10/2013), Prediabetes (09/02/2015), and SOB (shortness of breath) (07/10/2013). Surgical: [name]  has a past surgical history that includes Cataract extraction; Abdominal hysterectomy; Cholecystectomy; Lumbar disc surgery; and Colonoscopy with propofol (N/A, 04/09/2018). Family: family history includes Anuerysm in her son; Cancer in her daughter and father; Diabetes in her daughter and mother; Heart disease in her mother; Hypertension in her daughter.  Laboratory Chemistry Profile   Renal Lab Results  Component Value Date   BUN 19 03/31/2021   CREATININE 0.78 123XX123   BCR NOT APPLICABLE 123XX123   GFRAA 78 08/01/2018   GFRNONAA >60 03/31/2021     Hepatic Lab Results  Component Value Date   AST 24 03/31/2021   ALT 20 03/31/2021   ALBUMIN 4.0 03/31/2021   ALKPHOS 34 (L) 03/31/2021     Electrolytes Lab Results  Component Value Date   NA 138 03/31/2021   K 3.8 03/31/2021   CL 105 03/31/2021   CALCIUM 9.2 03/31/2021     Bone No results found for: "VD25OH", "VD125OH2TOT", "[id number]", "[id number]", "25OHVITD1", "  25OHVITD2", "25OHVITD3", "TESTOFREE", "TESTOSTERONE"   Inflammation (CRP: Acute Phase) (ESR: Chronic Phase) No results found for: "CRP", "ESRSEDRATE", "LATICACIDVEN"     Note: Above Lab results reviewed.  Recent Imaging Review  MM 3D SCREEN BREAST BILATERAL CLINICAL DATA:  Screening.  EXAM: DIGITAL SCREENING BILATERAL MAMMOGRAM WITH TOMOSYNTHESIS AND CAD  TECHNIQUE: Bilateral screening digital craniocaudal and mediolateral oblique mammograms were obtained. Bilateral screening digital breast tomosynthesis was performed. The images were evaluated with computer-aided detection.  COMPARISON:  None available.  ACR Breast Density Category b: There are scattered areas of fibroglandular density.  FINDINGS: There are no findings suspicious for  malignancy.  IMPRESSION: No mammographic evidence of malignancy. A result letter of this screening mammogram will be mailed directly to the patient.  RECOMMENDATION: Screening mammogram in one year. (Code:SM-B-01Y)  BI-RADS CATEGORY  1: Negative.  Electronically Signed   By: Lajean Manes M.D.   On: 03/02/2022 16:11  Note: Reviewed        Physical Exam  General appearance: Well nourished, well developed, and well hydrated. In no apparent acute distress Mental status: Alert, oriented x 3 (person, place, & time)       Respiratory: No evidence of acute respiratory distress Eyes: PERLA Vitals: BP (!) 124/51 (BP Location: Right Arm, Patient Position: Sitting, Cuff Size: Normal)   Pulse 61   Temp (!) 97.2 F (36.2 C) (Temporal)   Resp 16   Ht 5\' 1"  (1.549 m)   Wt 150 lb (68 kg)   SpO2 97%   BMI 28.34 kg/m  BMI: Estimated body mass index is 28.34 kg/m as calculated from the following:   Height as of this encounter: 5\' 1"  (1.549 m).   Weight as of this encounter: 150 lb (68 kg). Ideal: Ideal body weight: 47.8 kg (105 lb 6.1 oz) Adjusted ideal body weight: 55.9 kg (123 lb 3.7 oz) Right shoulder pain Patient presents today in wheelchair.  Low back pain,  Assessment   Status Diagnosis  Controlled Controlled Controlled 1. Bilateral primary osteoarthritis of knee   2. Primary osteoarthritis of right knee   3. SI joint arthritis   4. Lumbar facet arthropathy   5. Chronic pain syndrome          Plan of Care  Ms. BELLANY ANDERMAN has a current medication list which includes the following long-term medication(s): atorvastatin, calcium citrate-vitamin d, ferrous sulfate, isosorbide mononitrate, and olmesartan-amlodipine-hctz.  Pharmacotherapy (Medications Ordered): Meds ordered this encounter  Medications   HYDROcodone-acetaminophen (NORCO/VICODIN) 5-325 MG tablet    Sig: Take 1 tablet by mouth every 12 (twelve) hours as needed for severe pain. Must last 30 days.     Dispense:  60 tablet    Refill:  0    Chronic Pain: STOP Act (Not applicable) Fill 1 day early if closed on refill date. Avoid benzodiazepines within 8 hours of opioids   Orders Placed This Encounter  Procedures   Tox[employer] Select 13 (MW), Urine    Volume: 30 ml(s). Minimum 3 ml of urine is needed. Document temperature of fresh sample. Indications: Long term (current) use of opiate analgesic [phone])    Order Specific Question:   Release to patient    Answer:   Immediate      Follow-up plan:   Return in about 3 months (around 08/02/2022) for Medication Management, in person.   Recent Visits Date Type Provider Dept  01/27/22 Office Visit Jocelyn Santa, MD [facility]  Showing recent visits within past 90 days and meeting all other  requirements Today's Visits Date Type Provider Dept  04/21/22 Office Visit Jocelyn Santa, MD [facility]  Showing today's visits and meeting all other requirements Future Appointments No visits were found meeting these conditions. Showing future appointments within next 90 days and meeting all other requirements  I discussed the assessment and treatment plan with the patient. The patient was provided an opportunity to ask questions and all were answered. The patient agreed with the plan and demonstrated an understanding of the instructions.  Patient advised to call back or seek an in-person evaluation if the symptoms or condition worsens.  Duration of encounter: 56minutes.  Note by: Jocelyn Santa, MD Date: 04/21/2022; Time: 11:24 AM

## 2022-04-21 NOTE — Progress Notes (Signed)
Nursing Pain Medication Assessment:  Safety precautions to be maintained throughout the outpatient stay will include: orient to surroundings, keep bed in low position, maintain call bell within reach at all times, provide assistance with transfer out of bed and ambulation.  Medication Inspection Compliance: Jocelyn [name] did not comply with our request to bring her pills to be counted. She was reminded that bringing the medication bottles, even when empty, is a requirement.  Medication: None brought in. Pill/Patch Count: None available to be counted. Bottle Appearance: No container available. Did not bring bottle(s) to appointment. Filled Date: N/A Last Medication intake:  Today  Patient reports that she has 3 tablets left

## 2022-04-25 ENCOUNTER — Other Ambulatory Visit: Payer: Self-pay | Admitting: Internal Medicine

## 2022-04-26 LAB — TOXASSURE SELECT 13 (MW), URINE

## 2022-05-25 ENCOUNTER — Telehealth: Payer: Self-pay | Admitting: Internal Medicine

## 2022-05-25 NOTE — Telephone Encounter (Signed)
Pts daughter called to report that her PCP saw her today and it was elevated:  Yesterday at pcp 171/60   Per Dr Serita Kyle note: Junctional versus ectopic atrial rhythm: Asymptomatic.  We will discontinue carvedilol with plans to repeat EKG at follow-up in 3 months. Hypertension: Blood pressure reasonable.  Hold carvedilol as above; continue remainder of medications.    She said that it went up after she stopped the carvedilol.   She has been feeling well... no dizziness, headaches, chest pain.   Will forward to Dr End and a she asked if we can call her back not the pt:    Evans,[name] F [phone]

## 2022-05-25 NOTE — Telephone Encounter (Signed)
Pt's daughter made aware of Dr. Serita Kyle recommendations and verbalized understanding.

## 2022-05-25 NOTE — Telephone Encounter (Signed)
Pt is concerned about bp increasing since her last appointment. Saw pcp for follow up and recommended seeing cardiologist.  Pt c/o BP issue: STAT if pt c/o blurred vision, one-sided weakness or slurred speech  1. What are your last 5 BP readings? Yesterday at pcp 171/60  2. Are you having any other symptoms (ex. Dizziness, headache, blurred vision, passed out)? Dizziness two weeks ago at church. Pt was given 2 asprin  3. What is your BP issue? elevated

## 2022-05-25 NOTE — Telephone Encounter (Signed)
As she is asymptomatic, I recommend that [name] monitor her BP at home (she should check it ~3x/week).  If it remains consistent above 140/90, she should let us know.  Continue current medications for time being; carvedilol should not be restarted at this time given concern for possible junction rhythm at our visit last month.  Jocelyn Kendall, MD [facility]

## 2022-07-16 ENCOUNTER — Other Ambulatory Visit: Payer: Self-pay | Admitting: Internal Medicine

## 2022-07-25 ENCOUNTER — Ambulatory Visit: Payer: 59 | Admitting: Medical

## 2022-07-26 ENCOUNTER — Other Ambulatory Visit: Payer: Self-pay | Admitting: Internal Medicine

## 2022-08-02 ENCOUNTER — Ambulatory Visit
Payer: 59 | Attending: Student in an Organized Health Care Education/Training Program | Admitting: Student in an Organized Health Care Education/Training Program

## 2022-08-02 ENCOUNTER — Encounter: Payer: Self-pay | Admitting: Student in an Organized Health Care Education/Training Program

## 2022-08-02 VITALS — BP 154/76 | HR 62 | Temp 97.9°F | Resp 16 | Ht 61.0 in | Wt 150.0 lb

## 2022-08-02 DIAGNOSIS — M170 Bilateral primary osteoarthritis of knee: Secondary | ICD-10-CM | POA: Diagnosis present

## 2022-08-02 DIAGNOSIS — M47816 Spondylosis without myelopathy or radiculopathy, lumbar region: Secondary | ICD-10-CM | POA: Insufficient documentation

## 2022-08-02 DIAGNOSIS — M5136 Other intervertebral disc degeneration, lumbar region: Secondary | ICD-10-CM | POA: Insufficient documentation

## 2022-08-02 DIAGNOSIS — G894 Chronic pain syndrome: Secondary | ICD-10-CM | POA: Insufficient documentation

## 2022-08-02 DIAGNOSIS — M47818 Spondylosis without myelopathy or radiculopathy, sacral and sacrococcygeal region: Secondary | ICD-10-CM | POA: Diagnosis present

## 2022-08-02 MED ORDER — HYDROCODONE-ACETAMINOPHEN 5-325 MG PO TABS: 1.0000 | ORAL_TABLET | Freq: Two times a day (BID) | ORAL | 0 refills | Status: AC | PRN

## 2022-08-02 MED ORDER — HYDROCODONE-ACETAMINOPHEN 5-325 MG PO TABS: 1.0000 | ORAL_TABLET | Freq: Two times a day (BID) | ORAL | 0 refills | Status: DC | PRN

## 2022-08-02 NOTE — Progress Notes (Signed)
PROVIDER NOTE: Information contained herein reflects review and annotations entered in association with encounter. Interpretation of such information and data should be left to medically-trained personnel. Information provided to patient can be located elsewhere in the medical record under "Patient Instructions". Document created using STT-dictation technology, any transcriptional errors that may result from process are unintentional.    Patient: Jocelyn [name]  Service Category: E/M  Provider: Edward Jolly, MD  DOB: 29-May-1931  DOS: 08/02/2022  Specialty: Interventional Pain Management  MRN: [id number]  Setting: Ambulatory outpatient  PCP: Gorden Harms, PA-C  Type: Established Patient    Referring Provider: Gorden Harms, PA-C  Location: Office  Delivery: Face-to-face     HPI  Jocelyn [name], a 89 y.o. year old female, is here today because of her Bilateral primary osteoarthritis of knee [M17.0]. Jocelyn [name] primary complain today is Knee Pain (bilat)  Last encounter: My last encounter with her was on 04/21/22  Pertinent problems: Jocelyn [name] has Lumbar degenerative disc disease; Hearing loss; Cerebrovascular accident (CVA) (HCC); Controlled substance agreement signed; Bilateral primary osteoarthritis of knee; Carpal tunnel syndrome on right; SI joint arthritis; Chronic pain of both knees; and Chronic pain syndrome on their pertinent problem list. Pain Assessment: Severity of Chronic pain is reported as a 6 /10. Location: Knee Right, Left/denies. Onset: 1 to 4 weeks ago. Quality: Aching, Throbbing. Timing: Constant. Modifying factor(s): meds, injections. Vitals:  height is 5\' 1"  (1.549 m) and weight is 150 lb (68 kg). Her temperature is 97.9 F (36.6 C). Her blood pressure is 154/76 (abnormal) and her pulse is 62. Her respiration is 16 and oxygen saturation is 98%.   Reason for encounter: medication management.    Patient is experiencing bilateral knee pain that is becoming more frequent  and intense.  She previously had bilateral intra-articular knee steroid injection on 09/01/21 that was helpful.  She states that the pain relief from the injections has worn off.  It provided approximately 75 to 80% pain relief for 10 months.  She would like to repeat these.  Refill of hydrocodone as below.  Pharmacotherapy Assessment  Analgesic: Hydrocodone 5 mg BID prn #60/month   Monitoring:  PMP: PDMP reviewed during this encounter.       Pharmacotherapy: No side-effects or adverse reactions reported. Compliance: No problems identified. Effectiveness: Clinically acceptable.  UDS:  Summary  Date Value Ref Range Status  04/21/2022 Note  Final    Comment:    ==================================================================== [employer] Select 13 (MW) ==================================================================== Test                             Result       Flag       Units  Drug Present and Declared for Prescription Verification   Hydrocodone                    664          EXPECTED   ng/mg creat   Hydromorphone                  258          EXPECTED   ng/mg creat   Dihydrocodeine                 59           EXPECTED   ng/mg creat   Norhydrocodone  442          EXPECTED   ng/mg creat    Sources of hydrocodone include scheduled prescription medications.    Hydromorphone, dihydrocodeine and norhydrocodone are expected    metabolites of hydrocodone. Hydromorphone and dihydrocodeine are    also available as scheduled prescription medications.  ==================================================================== Test                      Result    Flag   Units      Ref Range   Creatinine              104              mg/dL      >=16 ==================================================================== Declared Medications:  The flagging and interpretation on this report are based on the  following declared medications.  Unexpected results may arise from   inaccuracies in the declared medications.   **Note: The testing scope of this panel includes these medications:   Hydrocodone (Norco)   **Note: The testing scope of this panel does not include the  following reported medications:   Acetaminophen (Norco)  Albuterol  Alendronate (Fosamax)  Amlodipine  Aspirin  Atorvastatin  Calcium  Diclofenac (Voltaren)  Iron  Isosorbide (Imdur)  Olmesartan  Omeprazole  Vitamin D ==================================================================== For clinical consultation, please call [phone]. ====================================================================       ROS  Constitutional: Denies any fever or chills Gastrointestinal: No reported hemesis, hematochezia, vomiting, or acute GI distress Musculoskeletal:  Low back, bilateral knee pain Neurological: No reported episodes of acute onset apraxia, aphasia, dysarthria, agnosia, amnesia, paralysis, loss of coordination, or loss of consciousness  Medication Review  HYDROcodone-acetaminophen, Olmesartan-amLODIPine-HCTZ, albuterol, alendronate, aspirin EC, atorvastatin, calcium citrate-vitamin D, diclofenac Sodium, ferrous sulfate, isosorbide mononitrate, and omeprazole  History Review  Allergy: Jocelyn [name] has No Known Allergies. Drug: Jocelyn [name]  reports no history of drug use. Alcohol:  reports no history of alcohol use. Tobacco:  reports that she has never smoked. She has never used smokeless tobacco. Social: Jocelyn [name]  reports that she has never smoked. She has never used smokeless tobacco. She reports that she does not drink alcohol and does not use drugs. Medical:  has a past medical history of Arthritis of both knees (08/01/2015), Colitis, Essential hypertension, benign (07/28/2014), GERD (gastroesophageal reflux disease), iron deficiency anemia (09/02/2015), Hyperlipidemia, Hypertension, Lumbar pain, Medicare annual wellness visit, subsequent (04/07/2017), OSA on CPAP (08/01/2015),  Overweight (BMI 25.0-29.9) (07/10/2013), Prediabetes (09/02/2015), and SOB (shortness of breath) (07/10/2013). Surgical: Jocelyn [name]  has a past surgical history that includes Cataract extraction; Abdominal hysterectomy; Cholecystectomy; Lumbar disc surgery; and Colonoscopy with propofol (N/A, 04/09/2018). Family: family history includes Anuerysm in her son; Cancer in her daughter and father; Diabetes in her daughter and mother; Heart disease in her mother; Hypertension in her daughter.  Laboratory Chemistry Profile   Renal Lab Results  Component Value Date   BUN 19 03/31/2021   CREATININE 0.78 03/31/2021   BCR NOT APPLICABLE 08/01/2018   GFRAA 78 08/01/2018   GFRNONAA >60 03/31/2021     Hepatic Lab Results  Component Value Date   AST 24 03/31/2021   ALT 20 03/31/2021   ALBUMIN 4.0 03/31/2021   ALKPHOS 34 (L) 03/31/2021     Electrolytes Lab Results  Component Value Date   NA 138 03/31/2021   K 3.8 03/31/2021   CL 105 03/31/2021   CALCIUM 9.2 03/31/2021     Bone No results found for: "VD25OH", "[id number]", "  ZO1096EA5", "WU9811BJ4", "25OHVITD1", "25OHVITD2", "25OHVITD3", "TESTOFREE", "TESTOSTERONE"   Inflammation (CRP: Acute Phase) (ESR: Chronic Phase) No results found for: "CRP", "ESRSEDRATE", "LATICACIDVEN"     Note: Above Lab results reviewed.  Recent Imaging Review  MM 3D SCREEN BREAST BILATERAL CLINICAL DATA:  Screening.  EXAM: DIGITAL SCREENING BILATERAL MAMMOGRAM WITH TOMOSYNTHESIS AND CAD  TECHNIQUE: Bilateral screening digital craniocaudal and mediolateral oblique mammograms were obtained. Bilateral screening digital breast tomosynthesis was performed. The images were evaluated with computer-aided detection.  COMPARISON:  None available.  ACR Breast Density Category b: There are scattered areas of fibroglandular density.  FINDINGS: There are no findings suspicious for malignancy.  IMPRESSION: No mammographic evidence of malignancy. A result letter of  this screening mammogram will be mailed directly to the patient.  RECOMMENDATION: Screening mammogram in one year. (Code:SM-B-01Y)  BI-RADS CATEGORY  1: Negative.  Electronically Signed   By: Amie Portland M.D.   On: 03/02/2022 16:11  Note: Reviewed        Physical Exam  General appearance: Well nourished, well developed, and well hydrated. In no apparent acute distress Mental status: Alert, oriented x 3 (person, place, & time)       Respiratory: No evidence of acute respiratory distress Eyes: PERLA Vitals: BP (!) 154/76   Pulse 62   Temp 97.9 F (36.6 C)   Resp 16   Ht 5\' 1"  (1.549 m)   Wt 150 lb (68 kg)   SpO2 98%   BMI 28.34 kg/m  BMI: Estimated body mass index is 28.34 kg/m as calculated from the following:   Height as of this encounter: 5\' 1"  (1.549 m).   Weight as of this encounter: 150 lb (68 kg). Ideal: Ideal body weight: 47.8 kg (105 lb 6.1 oz) Adjusted ideal body weight: 55.9 kg (123 lb 3.7 oz)  Patient presents today in wheelchair.  Low back pain, bilateral knee pain.  Assessment   Status Diagnosis  Having a Flare-up Having a Flare-up Having a Flare-up 1. Bilateral primary osteoarthritis of knee   2. SI joint arthritis   3. Lumbar facet arthropathy   4. Lumbar degenerative disc disease   5. Chronic pain syndrome          Plan of Care  Jocelyn [name] has a current medication list which includes the following long-term medication(s): atorvastatin, calcium citrate-vitamin d, ferrous sulfate, isosorbide mononitrate, and olmesartan-amlodipine-hctz.  Pharmacotherapy (Medications Ordered): Meds ordered this encounter  Medications   HYDROcodone-acetaminophen (NORCO/VICODIN) 5-325 MG tablet    Sig: Take 1 tablet by mouth every 12 (twelve) hours as needed for severe pain. Must last 30 days.    Dispense:  60 tablet    Refill:  0    Chronic Pain: STOP Act (Not applicable) Fill 1 day early if closed on refill date. Avoid benzodiazepines within 8  hours of opioids   HYDROcodone-acetaminophen (NORCO/VICODIN) 5-325 MG tablet    Sig: Take 1 tablet by mouth every 12 (twelve) hours as needed for severe pain. Must last 30 days.    Dispense:  60 tablet    Refill:  0    Chronic Pain: STOP Act (Not applicable) Fill 1 day early if closed on refill date. Avoid benzodiazepines within 8 hours of opioids   Orders Placed This Encounter  Procedures   KNEE INJECTION    Local Anesthetic & Steroid injection.    Standing Status:   Future    Standing Expiration Date:   11/02/2022    Scheduling Instructions:     Side:  Bilateral     Sedation: None     Timeframe: As soon as schedule allows    Order Specific Question:   Where will this procedure be performed?    Answer:   [facility]      Follow-up plan:   Return in about 22 days (around 08/24/2022) for B/L knee steroid injection .   Recent Visits No visits were found meeting these conditions. Showing recent visits within past 90 days and meeting all other requirements Today's Visits Date Type Provider Dept  08/02/22 Office Visit Edward Jolly, MD [facility]  Showing today's visits and meeting all other requirements Future Appointments Date Type Provider Dept  08/24/22 Appointment Edward Jolly, MD [facility]  09/27/22 Appointment Edward Jolly, MD [facility]  Showing future appointments within next 90 days and meeting all other requirements  I discussed the assessment and treatment plan with the patient. The patient was provided an opportunity to ask questions and all were answered. The patient agreed with the plan and demonstrated an understanding of the instructions.  Patient advised to call back or seek an in-person evaluation if the symptoms or condition worsens.  Duration of encounter: .  Note by: Edward Jolly, MD Date: 08/02/2022; Time: 11:37 AM

## 2022-08-02 NOTE — Progress Notes (Signed)
Nursing Pain Medication Assessment:  Safety precautions to be maintained throughout the outpatient stay will include: orient to surroundings, keep bed in low position, maintain call bell within reach at all times, provide assistance with transfer out of bed and ambulation.  Medication Inspection Compliance: Pill count conducted under aseptic conditions, in front of the patient. Neither the pills nor the bottle was removed from the patient's sight at any time. Once count was completed pills were immediately returned to the patient in their original bottle.  Medication: Hydrocodone/APAP Pill/Patch Count:  9 of 60 pills remain Pill/Patch Appearance: Markings consistent with prescribed medication Bottle Appearance: Standard pharmacy container. Clearly labeled. Filled Date: 5 / 30 / 2024 Last Medication intake:  Today

## 2022-08-02 NOTE — Patient Instructions (Signed)

## 2022-08-08 NOTE — Progress Notes (Unsigned)
Cardiology Office Note:    Date:  08/09/2022   ID:  Jocelyn [name], Jocelyn [name] 08-27-1931, MRN [id number]  PCP:  Gorden Harms, PA-C  [facility] Cardiologist:  Yvonne Kendall, MD  [facility] Electrophysiologist:  None   Referring MD: Gorden Harms, PA-C   Chief Complaint: 3 month follow-up  History of Present Illness:    Jocelyn [name] is a 89 y.o. female with a hx of CAD, HTN, HLD, stroke, prediabetes, OSA on CPAP, GERD who presents for 3 month follow-up.   Remote pharmacologic MPI in 07/2010 showed a small, fixed apical defect with an LVEF of 44% with global hypokinesis with more severe hypokinesis at the apex.   She was evaluated by Dr. Okey Dupre in 10/2019 and reported relatively sudden onset of shortness of breath that began in 09/2019. Echo in 11/2019 showed an EF of 60 to 65%, grade 1 diastolic dysfunction, normal RV systolic function and ventricular cavity size, mild left atrial enlargement, and mild mitral regurgitation with moderate mitral annular calcification. When she was seen in follow-up in late 11/2019 she reported she was feeling better with less shortness of breath though did still have some dyspnea on exertion. In this setting, she underwent Lexiscan MPI on 12/13/2019 that showed no significant ischemia with normal wall motion and an EF estimated at 40% felt to be depressed secondary to GI uptake artifact. CT attenuation corrected images showed mild to moderate aortic atherosclerosis with significant mitral valve annular calcification and mild coronary artery calcification. Overall, this was a low risk scan.    Last seen March 2024 and Coreg was held in the setting of bradycardia with concern for junctional or ectopic atrial rhythm.   Today, EKG shows afib with controlled rates. Over the weekend she missed some doses of BP meds and this made her feel bad. She recently restarted her BP meds and is feeling better. EKG reviewed with DOD, confirmed new onset afib. Patient denies  chest pain, she has exertional SOB. She uses a walker. She has mild lower leg edema. No lightheadedness or dizziness.   Past Medical History:  Diagnosis Date   Arthritis of both knees 08/01/2015   Colitis    Essential hypertension, benign 07/28/2014   GERD (gastroesophageal reflux disease)    Hx of iron deficiency anemia 09/02/2015   Hyperlipidemia    Hypertension    Lumbar pain    Medicare annual wellness visit, subsequent 04/07/2017   OSA on CPAP 08/01/2015   Overweight (BMI 25.0-29.9) 07/10/2013   Prediabetes 09/02/2015   SOB (shortness of breath) 07/10/2013    Past Surgical History:  Procedure Laterality Date   ABDOMINAL HYSTERECTOMY     CATARACT EXTRACTION     CHOLECYSTECTOMY     COLONOSCOPY WITH PROPOFOL N/A 04/09/2018   Procedure: COLONOSCOPY WITH PROPOFOL;  Surgeon: Toney Reil, MD;  Location: [facility];  Service: Gastroenterology;  Laterality: N/A;   LUMBAR DISC SURGERY      Current Medications: Current Meds  Medication Sig   albuterol (VENTOLIN HFA) 108 (90 Base) MCG/ACT inhaler Inhale 1 puff into the lungs as needed.   alendronate (FOSAMAX) 70 MG tablet Take 70 mg by mouth once a week.   apixaban (ELIQUIS) 5 MG TABS tablet Take 1 tablet (5 mg total) by mouth 2 (two) times daily.   atorvastatin (LIPITOR) 40 MG tablet TAKE 1 TABLET BY MOUTH EVERY DAY   calcium citrate-vitamin D 500-400 MG-UNIT chewable tablet Chew 1 tablet by mouth 2 (two) times daily.    ferrous  sulfate 325 (65 FE) MG tablet Take 1 tablet (325 mg total) by mouth daily.   HYDROcodone-acetaminophen (NORCO/VICODIN) 5-325 MG tablet Take 1 tablet by mouth every 12 (twelve) hours as needed for severe pain. Must last 30 days.   [START ON 09/04/2022] HYDROcodone-acetaminophen (NORCO/VICODIN) 5-325 MG tablet Take 1 tablet by mouth every 12 (twelve) hours as needed for severe pain. Must last 30 days.   isosorbide mononitrate (IMDUR) 120 MG 24 hr tablet TAKE 1 TABLET BY MOUTH EVERY DAY   Olmesartan-amLODIPine-HCTZ  40-10-12.5 MG TABS Take 1 tablet by mouth daily. For blood pressure   omeprazole (PRILOSEC) 20 MG capsule Take 20 mg by mouth daily.   [DISCONTINUED] aspirin EC 81 MG tablet Take 1 tablet (81 mg total) by mouth daily.     Allergies:   Patient has no known allergies.   Social History   Socioeconomic History   Marital status: Widowed    Spouse name: Not on file   Number of children: 5   Years of education: Not on file   Highest education level: Not on file  Occupational History   Not on file  Tobacco Use   Smoking status: Never   Smokeless tobacco: Never  Vaping Use   Vaping Use: Never used  Substance and Sexual Activity   Alcohol use: No    Alcohol/week: 0.0 standard drinks of alcohol   Drug use: No   Sexual activity: Not Currently  Other Topics Concern   Not on file  Social History Narrative   Not on file   Social Determinants of Health   Financial Resource Strain: Medium Risk (07/26/2018)   Overall Financial Resource Strain (CARDIA)    Difficulty of Paying Living Expenses: Somewhat hard  Food Insecurity: Food Insecurity Present (07/26/2018)   Hunger Vital Sign    Worried About Running Out of Food in the Last Year: Sometimes true    Ran Out of Food in the Last Year: Never true  Transportation Needs: No Transportation Needs (07/26/2018)   PRAPARE - Administrator, Civil Service (Medical): No    Lack of Transportation (Non-Medical): No  Physical Activity: Inactive (07/26/2018)   Exercise Vital Sign    Days of Exercise per Week: 0 days    Minutes of Exercise per Session: 0 min  Stress: No Stress Concern Present (07/26/2018)   [employer] of Occupational Health - Occupational Stress Questionnaire    Feeling of Stress : Not at all  Social Connections: Moderately Isolated (07/26/2018)   Social Connection and Isolation Panel [NHANES]    Frequency of Communication with Friends and Family: More than three times a week    Frequency of Social Gatherings with  Friends and Family: Three times a week    Attends Religious Services: More than 4 times per year    Active Member of Clubs or Organizations: No    Attends Banker Meetings: Never    Marital Status: Widowed     Family History: The patient's family history includes Anuerysm in her son; Cancer in her daughter and father; Diabetes in her daughter and mother; Heart disease in her mother; Hypertension in her daughter. There is no history of Breast cancer.  ROS:   Please see the history of present illness.     All other systems reviewed and are negative.  EKGs/Labs/Other Studies Reviewed:    The following studies were reviewed today:  Myoview lexiscan 12/2019  Narrative & Impression  Pharmacological myocardial perfusion imaging study with no significant  ischemia Normal wall motion, EF estimated at 43% (likely depressed secondary to GI uptake artifact) No EKG changes concerning for ischemia at peak stress or in recovery. CT attenuation correction images with mild to moderate aortic atherosclerosis, significant mitral valve annular calcification, mild coronary calcification Low risk scan     Signed, Dossie Arbour, MD, Ph.D [facility]   Echo 11/2019 1. Left ventricular ejection fraction, by estimation, is 60 to 65%. The  left ventricle has normal function. The left ventricle has no regional  wall motion abnormalities. There is severe left ventricular hypertrophy.  Left ventricular diastolic parameters   are consistent with Grade I diastolic dysfunction (impaired relaxation).   2. Right ventricular systolic function is normal. The right ventricular  size is normal. Tricuspid regurgitation signal is inadequate for assessing  PA pressure.   3. Left atrial size was moderately dilated.   4. Mild mitral valve regurgitation. Moderate mitral annular  calcification.   EKG:  EKG is ordered today.  The ekg ordered today demonstrates Afib, 68bpm, nonspecific T wave  changes  Recent Labs: No results found for requested labs within last 365 days.  Recent Lipid Panel    Component Value Date/Time   CHOL 122 03/31/2021 1040   CHOL 128 03/02/2020 1051   TRIG 47 03/31/2021 1040   HDL 56 03/31/2021 1040   HDL 55 03/02/2020 1051   CHOLHDL 2.2 03/31/2021 1040   VLDL 9 03/31/2021 1040   LDLCALC 57 03/31/2021 1040   LDLCALC 60 03/02/2020 1051   LDLCALC 120 (H) 08/01/2018 1544   LDLDIRECT 65 03/02/2020 1051     Physical Exam:    VS:  BP (!) 167/66 (BP Location: Left Arm, Patient Position: Sitting, Cuff Size: Normal)   Pulse 68   Ht 5\' 1"  (1.549 m)   Wt 157 lb (71.2 kg)   SpO2 96%   BMI 29.66 kg/m     Wt Readings from Last 3 Encounters:  08/09/22 157 lb (71.2 kg)  08/02/22 150 lb (68 kg)  04/21/22 150 lb (68 kg)     GEN:  Well nourished, well developed in no acute distress HEENT: Normal NECK: No JVD; No carotid bruits LYMPHATICS: No lymphadenopathy CARDIAC: Irreg Irreg, no murmurs, rubs, gallops RESPIRATORY:  Clear to auscultation without rales, wheezing or rhonchi  ABDOMEN: Soft, non-tender, non-distended MUSCULOSKELETAL:  mild pedal edema; No deformity  SKIN: Warm and dry NEUROLOGIC:  Alert and oriented x 3 PSYCHIATRIC:  Normal affect   ASSESSMENT:    1. Atrial fibrillation, unspecified type (HCC)   2. Lower extremity edema   3. Coronary artery disease of native artery of native heart with stable angina pectoris (HCC)   4. Essential hypertension   5. Hyperlipidemia LDL goal <70    PLAN:    In order of problems listed above:  New onset Afib EKG today shows new onset afib with controlled rates. EKG reviewed with DOD.  BB held at the last visit for junctional vs ectopic atrial rhythm. Patient overall feels comfortable. She reports mild exertional dyspnea and mild lower leg edema. CHADSVASC at least 7 (agex2, female, strokex2,HTN, PAD).  I will start Eliquis 5mg  BID. I will update blood work today withTSH, Mag, CMET and CBC. I will  also update an echocardiogram. I will hold on starting BB at this time. I will order a 2 week heart monitor to assess Afib. If patient is paroxysmal no need for cardioversion. Further recommendations pending heart monitor.   Lower leg edema I will update echo as  above. She is on hydrochlorothiazide 12.5mg  daily.   CAD Patient denies chest pain. Stop ASA for Eliquis. Continue Lipitor. BB held as above. She had a low risk MPI in 2021.   HTN BP high, but she recently restarted her BP meds. Continue Imdur 120mg  daily, olmesartan-amlodipine-hydrochlorothiazide.    HLD LDL 57 in 2023, continue Lipitor 40mg  daily.   Disposition: Follow up in 4-6 week(s) with MD/APP    Signed, Kaytelyn Glore David Stall, PA-C  08/09/2022 10:58 AM    [facility]

## 2022-08-09 ENCOUNTER — Encounter: Payer: Self-pay | Admitting: Medical

## 2022-08-09 ENCOUNTER — Ambulatory Visit: Payer: 59 | Attending: Medical | Admitting: Medical

## 2022-08-09 ENCOUNTER — Ambulatory Visit (INDEPENDENT_AMBULATORY_CARE_PROVIDER_SITE_OTHER): Payer: 59

## 2022-08-09 ENCOUNTER — Other Ambulatory Visit
Admission: RE | Admit: 2022-08-09 | Discharge: 2022-08-09 | Disposition: A | Discharge status: Home / Self Care | Payer: 59 | Source: Ambulatory Visit | Attending: Medical | Admitting: Medical

## 2022-08-09 VITALS — BP 167/66 | HR 68 | Ht 61.0 in | Wt 157.0 lb

## 2022-08-09 DIAGNOSIS — I4891 Unspecified atrial fibrillation: Secondary | ICD-10-CM

## 2022-08-09 DIAGNOSIS — I10 Essential (primary) hypertension: Secondary | ICD-10-CM | POA: Insufficient documentation

## 2022-08-09 DIAGNOSIS — E785 Hyperlipidemia, unspecified: Secondary | ICD-10-CM | POA: Diagnosis present

## 2022-08-09 DIAGNOSIS — R600 Localized edema: Secondary | ICD-10-CM | POA: Diagnosis not present

## 2022-08-09 DIAGNOSIS — I25118 Atherosclerotic heart disease of native coronary artery with other forms of angina pectoris: Secondary | ICD-10-CM | POA: Diagnosis not present

## 2022-08-09 LAB — COMPREHENSIVE METABOLIC PANEL
ALT: 97 U/L — ABNORMAL HIGH (ref 0–44)
AST: 63 U/L — ABNORMAL HIGH (ref 15–41)
Albumin: 3.8 g/dL (ref 3.5–5.0)
Alkaline Phosphatase: 69 U/L (ref 38–126)
Anion gap: 9 (ref 5–15)
BUN: 16 mg/dL (ref 8–23)
CO2: 26 mmol/L (ref 22–32)
Calcium: 8.8 mg/dL — ABNORMAL LOW (ref 8.9–10.3)
Chloride: 103 mmol/L (ref 98–111)
Creatinine, Ser: 0.73 mg/dL (ref 0.44–1.00)
GFR, Estimated: >60 mL/min (ref >60)
Glucose, Bld: 100 mg/dL — ABNORMAL HIGH (ref 70–99)
Potassium: 3.3 mmol/L — ABNORMAL LOW (ref 3.5–5.1)
Sodium: 138 mmol/L (ref 135–145)
Total Bilirubin: 1 mg/dL (ref 0.3–1.2)
Total Protein: 6.8 g/dL (ref 6.5–8.1)

## 2022-08-09 LAB — CBC
HCT: 31.9 % — ABNORMAL LOW (ref 36.0–46.0)
Hemoglobin: 10.3 g/dL — ABNORMAL LOW (ref 12.0–15.0)
MCH: 27.5 pg (ref 26.0–34.0)
MCHC: 32.3 g/dL (ref 30.0–36.0)
MCV: 85.3 fL (ref 80.0–100.0)
Platelets: 144 10*3/uL — ABNORMAL LOW (ref 150–400)
RBC: 3.74 MIL/uL — ABNORMAL LOW (ref 3.87–5.11)
RDW: 14.3 % (ref 11.5–15.5)
WBC: 4.7 10*3/uL (ref 4.0–10.5)
nRBC: 0 % (ref 0.0–0.2)

## 2022-08-09 LAB — TSH: TSH: 0.686 u[IU]/mL (ref 0.350–4.500)

## 2022-08-09 LAB — MAGNESIUM: Magnesium: 1.9 mg/dL (ref 1.7–2.4)

## 2022-08-09 MED ORDER — APIXABAN 5 MG PO TABS: 5.0000 mg | ORAL_TABLET | Freq: Two times a day (BID) | ORAL | 1 refills | Status: DC

## 2022-08-09 NOTE — Patient Instructions (Signed)
Medication Instructions:   STOP Aspirin.  START Eliquis 5 mg tablet by mouth twice daily.  *If you need a refill on your cardiac medications before your next appointment, please call your pharmacy*   Lab Work:  Your physician recommends that you have lab work TODAY.  Magnesium/CMET/TSH/CBC  - Please go to the [facility]. You will check in at the front desk to the right as you walk into the atrium. Valet Parking is offered if needed. - No appointment needed. You may go any day between 7 am and 6 pm.   If you have labs (blood work) drawn today and your tests are completely normal, you will receive your results only by: MyChart Message (if you have MyChart) OR A paper copy in the mail If you have any lab test that is abnormal or we need to change your treatment, we will call you to review the results.   Testing/Procedures:  Your physician has recommended that you wear a Zio monitor for 14 days.  This monitor is a medical device that records the heart's electrical activity. Doctors most often use these monitors to diagnose arrhythmias. Arrhythmias are problems with the speed or rhythm of the heartbeat. The monitor is a small device applied to your chest. You can wear one while you do your normal daily activities. While wearing this monitor if you have any symptoms to push the button and record what you felt. Once you have worn this monitor for the period of time provider prescribed (Usually 14 days), you will return the monitor device in the postage paid box. Once it is returned they will download the data collected and provide [place] with a report which the provider will then review and we will call you with those results. Important tips:  Avoid showering during the first 24 hours of wearing the monitor. Avoid excessive sweating to help maximize wear time. Do not submerge the device, no hot tubs, and no swimming pools. Keep any lotions or oils away from the patch. After 24 hours  you may shower with the patch on. Take brief showers with your back facing the shower head.  Do not remove patch once it has been placed because that will interrupt data and decrease adhesive wear time. Push the button when you have any symptoms and write down what you were feeling. Once you have completed wearing your monitor, remove and place into box which has postage paid and place in your outgoing mailbox.  If for some reason you have misplaced your box then call our office and we can provide another box and/or mail it off for you.    Your physician has requested that you have an echocardiogram. Echocardiography is a painless test that uses sound waves to create images of your heart. It provides your doctor with information about the size and shape of your heart and how well your heart's chambers and valves are working. This procedure takes approximately one hour. There are no restrictions for this procedure. Please do NOT wear cologne, perfume, aftershave, or lotions (deodorant is allowed). Please arrive 15 minutes prior to your appointment time.   Follow-Up: At [facility], you and your health needs are our priority.  As part of our continuing mission to provide you with exceptional heart care, we have created designated Provider Care Teams.  These Care Teams include your primary Cardiologist (physician) and Advanced Practice Providers (APPs -  Physician Assistants and Nurse Practitioners) who all work together to provide you with  the care you need, when you need it.  We recommend signing up for the patient portal called "MyChart".  Sign up information is provided on this After Visit Summary.  MyChart is used to connect with patients for Virtual Visits (Telemedicine).  Patients are able to view lab/test results, encounter notes, upcoming appointments, etc.  Non-urgent messages can be sent to your provider as well.   To learn more about what you can do with MyChart, go to  [email or web address].    Your next appointment:   4-6 week(s)  Provider:   Yvonne Kendall, MD

## 2022-08-10 ENCOUNTER — Other Ambulatory Visit: Payer: Self-pay

## 2022-08-10 DIAGNOSIS — Z79899 Other long term (current) drug therapy: Secondary | ICD-10-CM

## 2022-08-10 MED ORDER — POTASSIUM CHLORIDE CRYS ER 20 MEQ PO TBCR: 80.0000 meq | EXTENDED_RELEASE_TABLET | Freq: Once | ORAL | 0 refills | Status: DC

## 2022-08-10 MED ORDER — ATORVASTATIN CALCIUM 10 MG PO TABS: 10.0000 mg | ORAL_TABLET | Freq: Every day | ORAL | 3 refills | Status: DC

## 2022-08-14 DIAGNOSIS — I4891 Unspecified atrial fibrillation: Secondary | ICD-10-CM | POA: Diagnosis not present

## 2022-08-17 ENCOUNTER — Telehealth: Payer: Self-pay | Admitting: Medical

## 2022-08-17 NOTE — Telephone Encounter (Signed)
Called patient, spoke with daughter. She states that patient sweats a lot- she states that she tries to put it on back on, but it falls back off after she begins to sweat. She would like to know what she should do to get the information she needs. Patient daughter advised I would route a message to see if PA would recommend anything different, they are going to leave it off for now until they know what to do with it.   Will route to PA/RN.

## 2022-08-17 NOTE — Telephone Encounter (Signed)
Patient's daughter is calling stating the patient's heart monitor was placed Saturday night, but fell off Monday. She put it back on, but it fell off again. She reports the patient sweats a lot and is no longer sticking so they are unsure of what to do. Please advise.

## 2022-08-19 NOTE — Telephone Encounter (Signed)
Called daughter, LVM advising will discuss at follow up.  Advised to call back if any questions/concerns.

## 2022-08-24 ENCOUNTER — Ambulatory Visit
Payer: 59 | Attending: Student in an Organized Health Care Education/Training Program | Admitting: Student in an Organized Health Care Education/Training Program

## 2022-08-24 ENCOUNTER — Encounter: Payer: Self-pay | Admitting: Student in an Organized Health Care Education/Training Program

## 2022-08-24 VITALS — BP 147/63 | HR 76 | Temp 98.4°F | Resp 14 | Ht 60.0 in | Wt 147.0 lb

## 2022-08-24 DIAGNOSIS — M170 Bilateral primary osteoarthritis of knee: Secondary | ICD-10-CM | POA: Diagnosis present

## 2022-08-24 MED ORDER — ROPIVACAINE HCL 2 MG/ML IJ SOLN
9.0000 mL | Freq: Once | INTRAMUSCULAR | Status: AC
  Administered 2022-08-24: 9 mL via PERINEURAL
  Filled 2022-08-24: qty 20

## 2022-08-24 MED ORDER — METHYLPREDNISOLONE ACETATE 80 MG/ML IJ SUSP
80.0000 mg | Freq: Once | INTRAMUSCULAR | Status: AC
  Administered 2022-08-24: 80 mg via INTRA_ARTICULAR
  Filled 2022-08-24: qty 1

## 2022-08-24 MED ORDER — LIDOCAINE HCL 2 % IJ SOLN
20.0000 mL | Freq: Once | INTRAMUSCULAR | Status: AC
  Administered 2022-08-24: 200 mg
  Filled 2022-08-24: qty 20

## 2022-08-24 NOTE — Patient Instructions (Signed)

## 2022-08-24 NOTE — Progress Notes (Signed)
PROVIDER NOTE: Interpretation of information contained herein should be left to medically-trained personnel. Specific patient instructions are provided elsewhere under "Patient Instructions" section of medical record. This document was created in part using STT-dictation technology, any transcriptional errors that may result from this process are unintentional.  Patient: Jocelyn [name] Type: Established DOB: 02-13-1931 MRN: [id number] PCP: Gorden Harms, PA-C  Service: Procedure DOS: 08/24/2022 Setting: Ambulatory Location: Ambulatory outpatient facility Delivery: Face-to-face Provider: Edward Jolly, MD Specialty: Interventional Pain Management Specialty designation: 09 Location: Outpatient facility Ref. Prov.: Gorden Harms, PA-C    Primary Reason for Visit: Interventional Pain Management Treatment. CC: Knee Pain (bilateral)   Procedure:           Type: Steroid Intra-articular Knee Injection Laterality: Bilateral (-50) Left side previously done 09/01/21 Level/approach: Medial Imaging guidance: None required ([id number]) Anesthesia: Local anesthesia (1-2% Lidocaine)   Purpose: Diagnostic/Therapeutic Indications: Knee arthralgia associated to osteoarthritis of the knee 1. Bilateral primary osteoarthritis of knee     NAS-11 score:   Pre-procedure: 6  (left, 0 in right knee)/10   Post-procedure: 3/10     Pre-Procedure Preparation  Monitoring: As per clinic protocol.  Risk Assessment: Vitals:  ONG:[id number] body mass index is 28.71 kg/m as calculated from the following:   Height as of this encounter: 5' (1.524 m).   Weight as of this encounter: 147 lb (66.7 kg)., Rate:76 , BP:(!) 147/63, Resp:14, Temp:98.4 F (36.9 C), SpO2:97 %  Allergies: She has No Known Allergies.  Precautions: No additional precautions required  Blood-thinner(s): None at this time  Coagulopathies: Reviewed. None identified.   Active Infection(s): Reviewed. None identified. Jocelyn [name] is afebrile    Location setting: Exam room Position: Sitting w/ knee bent 90 degrees Safety Precautions: Patient was assessed for positional comfort and pressure points before starting the procedure. Prepping solution: DuraPrep (Iodine Povacrylex [0.7% available iodine] and Isopropyl Alcohol, 74% w/w) Prep Area: Entire knee region Approach: percutaneous, just above the tibial plateau, lateral to the infrapatellar tendon. Intended target: Intra-articular knee space Materials: Tray: Block Needle(s): Regular Qty: 1/side Length: 1.5-inch Gauge: 25G  9 cc solution made of 8 cc of 0.2% ropivacaine, 1 cc of methylprednisolone, 80 mg/cc. 4.5 cc injected in each knee..    Meds ordered this encounter  Medications   lidocaine (XYLOCAINE) 2 % (with pres) injection 400 mg   methylPREDNISolone acetate (DEPO-MEDROL) injection 80 mg   ropivacaine (PF) 2 mg/mL (0.2%) (NAROPIN) injection 9 mL    No orders of the defined types were placed in this encounter.    Time-out: 1109 I initiated and conducted the "Time-out" before starting the procedure, as per protocol. The patient was asked to participate by confirming the accuracy of the "Time Out" information. Verification of the correct person, site, and procedure were performed and confirmed by me, the nursing staff, and the patient. "Time-out" conducted as per Joint Commission's Universal Protocol (UP.01.01.01). Procedure checklist: Completed   H&P (Pre-op  Assessment)  Jocelyn [name] is a 89 y.o. (year old), female patient, seen today for interventional treatment. She  has a past surgical history that includes Cataract extraction; Abdominal hysterectomy; Cholecystectomy; Lumbar disc surgery; and Colonoscopy with propofol (N/A, 04/09/2018). Jocelyn [name] has a current medication list which includes the following prescription(s): albuterol, alendronate, apixaban, atorvastatin, calcium citrate-vitamin d, ferrous sulfate, hydrocodone-acetaminophen, [START ON 09/04/2022]  hydrocodone-acetaminophen, isosorbide mononitrate, olmesartan-amlodipine-hctz, omeprazole, diclofenac sodium, and potassium chloride sa. Her primarily concern today is the Knee Pain (bilateral)  She has No Known Allergies.   Last encounter: My last  encounter with her was on 02/18/2021. Pertinent problems: Jocelyn [name] has Lumbar degenerative disc disease; Hearing loss; Cerebrovascular accident (CVA) (HCC); Controlled substance agreement signed; Bilateral primary osteoarthritis of knee; Carpal tunnel syndrome on right; SI joint arthritis; Chronic pain of both knees; and Chronic pain syndrome on their pertinent problem list. Pain Assessment: Severity of Chronic pain is reported as a 6  (left, 0 in right knee)/10. Location: Knee Right, Left/denies. Onset: More than a month ago. Quality: Aching, Throbbing. Timing: Intermittent. Modifying factor(s): nothing. Vitals:  height is 5' (1.524 m) and weight is 147 lb (66.7 kg). Her temporal temperature is 98.4 F (36.9 C). Her blood pressure is 147/63 (abnormal) and her pulse is 76. Her respiration is 14 and oxygen saturation is 97%.   Reason for encounter: "interventional pain management therapy due pain of at least four (4) weeks in duration, with failure to respond and/or inability to tolerate more conservative care.   Site Confirmation: Jocelyn [name] was asked to confirm the procedure and laterality before marking the site.  Consent: Before the procedure and under the influence of no sedative(s), amnesic(s), or anxiolytics, the patient was informed of the treatment options, risks and possible complications. To fulfill our ethical and legal obligations, as recommended by the American Medical Association's Code of Ethics, I have informed the patient of my clinical impression; the nature and purpose of the treatment or procedure; the risks, benefits, and possible complications of the intervention; the alternatives, including doing nothing; the risk(s) and benefit(s) of  the alternative treatment(s) or procedure(s); and the risk(s) and benefit(s) of doing nothing. The patient was provided information about the general risks and possible complications associated with the procedure. These may include, but are not limited to: failure to achieve desired goals, infection, bleeding, organ or nerve damage, allergic reactions, paralysis, and death. In addition, the patient was informed of those risks and complications associated to Spine-related procedures, such as failure to decrease pain; infection (i.e.: Meningitis, epidural or intraspinal abscess); bleeding (i.e.: epidural hematoma, subarachnoid hemorrhage, or any other type of intraspinal or peri-dural bleeding); organ or nerve damage (i.e.: Any type of peripheral nerve, nerve root, or spinal cord injury) with subsequent damage to sensory, motor, and/or autonomic systems, resulting in permanent pain, numbness, and/or weakness of one or several areas of the body; allergic reactions; (i.e.: anaphylactic reaction); and/or death. Furthermore, the patient was informed of those risks and complications associated with the medications. These include, but are not limited to: allergic reactions (i.e.: anaphylactic or anaphylactoid reaction(s)); adrenal axis suppression; blood sugar elevation that in diabetics may result in ketoacidosis or comma; water retention that in patients with history of congestive heart failure may result in shortness of breath, pulmonary edema, and decompensation with resultant heart failure; weight gain; swelling or edema; medication-induced neural toxicity; particulate matter embolism and blood vessel occlusion with resultant organ, and/or nervous system infarction; and/or aseptic necrosis of one or more joints. Finally, the patient was informed that Medicine is not an exact science; therefore, there is also the possibility of unforeseen or unpredictable risks and/or possible complications that may result in a  catastrophic outcome. The patient indicated having understood very clearly. We have given the patient no guarantees and we have made no promises. Enough time was given to the patient to ask questions, all of which were answered to the patient's satisfaction. Ms. Gomm has indicated that she wanted to continue with the procedure. Attestation: I, the ordering provider, attest that I have discussed with the patient the benefits,  risks, side-effects, alternatives, likelihood of achieving goals, and potential problems during recovery for the procedure that I have provided informed consent.  Date  Time: 08/24/2022 10:41 AM   Description of procedure   Start Time: 1109 hrs  Local Anesthesia: Once the patient was positioned, prepped, and time-out was completed. The target area was identified located. The skin was marked with an approved surgical skin marker. Once marked, the skin (epidermis, dermis, and hypodermis), and deeper tissues (fat, connective tissue and muscle) were infiltrated with a small amount of a short-acting local anesthetic, loaded on a 10cc syringe with a 25G, 1.5-in  Needle. An appropriate amount of time was allowed for local anesthetics to take effect before proceeding to the next step. Local Anesthetic: Lidocaine 1-2% The unused portion of the local anesthetic was discarded in the proper designated containers. Safety Precautions: Aspiration looking for blood return was conducted prior to all injections. At no point did I inject any substances, as a needle was being advanced. Before injecting, the patient was told to immediately notify me if she was experiencing any new onset of "ringing in the ears, or metallic taste in the mouth". No attempts were made at seeking any paresthesias. Safe injection practices and needle disposal techniques used. Medications properly checked for expiration dates. SDV (single dose vial) medications used. After the completion of the procedure, all disposable  equipment used was discarded in the proper designated medical waste containers.  Technical description: Protocol guidelines were followed. After positioning, the target area was identified and prepped in the usual manner. Skin & deeper tissues infiltrated with local anesthetic. Appropriate amount of time allowed to pass for local anesthetics to take effect. Proper needle placement secured. Once satisfactory needle placement was confirmed, I proceeded to inject the desired solution in slow, incremental fashion, intermittently assessing for discomfort or any signs of abnormal or undesired spread of substance. Once completed, the needle was removed and disposed of, as per hospital protocols. The area was cleaned, making sure to leave some of the prepping solution back to take advantage of its long term bactericidal properties.  Aspiration:  Negative   Vitals:   08/24/22 1049  BP: (!) 147/63  Pulse: 76  Resp: 14  Temp: 98.4 F (36.9 C)  TempSrc: Temporal  SpO2: 97%  Weight: 147 lb (66.7 kg)  Height: 5' (1.524 m)    End Time: 1111 hrs   Post-op assessment  Post-procedure Vital Signs:  Pulse/HCG Rate: 76  Temp: 98.4 F (36.9 C) Resp: 14 BP: (!) 147/63 SpO2: 97 %  EBL: None  Complications: No immediate post-treatment complications observed by team, or reported by patient.  Note: The patient tolerated the entire procedure well. A repeat set of vitals were taken after the procedure and the patient was kept under observation following institutional policy, for this type of procedure. Post-procedural neurological assessment was performed, showing return to baseline, prior to discharge. The patient was provided with post-procedure discharge instructions, including a section on how to identify potential problems. Should any problems arise concerning this procedure, the patient was given instructions to immediately contact us, at any time, without hesitation. In any case, we plan to contact the  patient by telephone for a follow-up status report regarding this interventional procedure.  Comments:  No additional relevant information.   Plan of care  Chronic Opioid Analgesic:  Hydrocodone 5 mg BID prn #60/month   Medications administered: We administered lidocaine, methylPREDNISolone acetate, and ropivacaine (PF) 2 mg/mL (0.2%).    Requested Prescriptions  No prescriptions requested or ordered in this encounter     Follow-up plan:   Return keep scheduled appt..     Recent Visits Date Type Provider Dept  08/02/22 Office Visit Edward Jolly, MD [facility]  Showing recent visits within past 90 days and meeting all other requirements Today's Visits Date Type Provider Dept  08/24/22 Procedure visit Edward Jolly, MD [facility]  Showing today's visits and meeting all other requirements Future Appointments Date Type Provider Dept  09/27/22 Appointment Edward Jolly, MD [facility]  Showing future appointments within next 90 days and meeting all other requirements   Disposition: Discharge home  Discharge (Date  Time): 08/24/2022; 1112 hrs.   Primary Care Physician: Gorden Harms, PA-C Location: [facility] Note by: Edward Jolly, MD Date: 08/24/2022; Time: 11:17 AM  DISCLAIMER: Medicine is not an exact science. It has no guarantees or warranties. The decision to proceed with this intervention was based on the information collected from the patient. Conclusions were drawn from the patient's questionnaire, interview, and examination. Because information was provided in large part by the patient, it cannot be guaranteed that it has not been purposely or unconsciously manipulated or altered. Every effort has been made to obtain as much accurate, relevant, available data as possible. Always take into account that the treatment will also be dependent on availability of resources and existing treatment guidelines,  considered by other Pain Management Specialists as being common knowledge and practice, at the time of the intervention. It is also important to point out that variation in procedural techniques and pharmacological choices are the acceptable norm. For Medico-Legal review purposes, the indications, contraindications, technique, and results of the these procedures should only be evaluated, judged and interpreted by a Board-Certified Interventional Pain Specialist with extensive familiarity and expertise in the same exact procedure and technique.

## 2022-08-25 ENCOUNTER — Telehealth: Payer: Self-pay | Admitting: *Deleted

## 2022-08-25 NOTE — Telephone Encounter (Signed)
Attempted to call for post procedure follow-up. No answer, no voicemail. 

## 2022-08-29 ENCOUNTER — Other Ambulatory Visit: Payer: Self-pay

## 2022-08-29 DIAGNOSIS — I4891 Unspecified atrial fibrillation: Secondary | ICD-10-CM

## 2022-09-04 ENCOUNTER — Other Ambulatory Visit: Payer: Self-pay | Admitting: Internal Medicine

## 2022-09-04 ENCOUNTER — Other Ambulatory Visit: Payer: Self-pay | Admitting: Medical

## 2022-09-05 ENCOUNTER — Other Ambulatory Visit
Admission: RE | Admit: 2022-09-05 | Discharge: 2022-09-05 | Disposition: A | Discharge status: Home / Self Care | Payer: 59 | Attending: Medical | Admitting: Medical

## 2022-09-05 ENCOUNTER — Ambulatory Visit: Payer: 59 | Attending: Medical

## 2022-09-05 DIAGNOSIS — I4891 Unspecified atrial fibrillation: Secondary | ICD-10-CM | POA: Diagnosis not present

## 2022-09-05 DIAGNOSIS — R600 Localized edema: Secondary | ICD-10-CM

## 2022-09-05 DIAGNOSIS — Z79899 Other long term (current) drug therapy: Secondary | ICD-10-CM | POA: Insufficient documentation

## 2022-09-05 LAB — BASIC METABOLIC PANEL
Anion gap: 9 (ref 5–15)
BUN: 17 mg/dL (ref 8–23)
CO2: 22 mmol/L (ref 22–32)
Calcium: 9.4 mg/dL (ref 8.9–10.3)
Chloride: 107 mmol/L (ref 98–111)
Creatinine, Ser: 0.73 mg/dL (ref 0.44–1.00)
GFR, Estimated: >60 mL/min (ref >60)
Glucose, Bld: 95 mg/dL (ref 70–99)
Potassium: 3.7 mmol/L (ref 3.5–5.1)
Sodium: 138 mmol/L (ref 135–145)

## 2022-09-05 LAB — ECHOCARDIOGRAM COMPLETE: S' Lateral: 2.5 cm

## 2022-09-05 LAB — HEPATIC FUNCTION PANEL
ALT: 40 U/L (ref 0–44)
AST: 31 U/L (ref 15–41)
Albumin: 3.8 g/dL (ref 3.5–5.0)
Alkaline Phosphatase: 40 U/L (ref 38–126)
Bilirubin, Direct: 0.2 mg/dL (ref 0.0–0.2)
Indirect Bilirubin: 0.8 mg/dL (ref 0.3–0.9)
Total Bilirubin: 1 mg/dL (ref 0.3–1.2)
Total Protein: 6.6 g/dL (ref 6.5–8.1)

## 2022-09-06 ENCOUNTER — Other Ambulatory Visit: Payer: Self-pay

## 2022-09-06 DIAGNOSIS — I517 Cardiomegaly: Secondary | ICD-10-CM

## 2022-09-09 ENCOUNTER — Ambulatory Visit: Payer: 59 | Admitting: Medical

## 2022-09-15 NOTE — Progress Notes (Unsigned)
Cardiology Office Note    Date:  09/19/2022   ID:  Jocelyn [name], Jocelyn [name] December 22, 1931, MRN [id number]  PCP:  Gorden Harms, PA-C  Cardiologist:  Yvonne Kendall, MD  Electrophysiologist:  None   Chief Complaint: Follow-up  History of Present Illness:   Jocelyn [name] is a 89 y.o. female with history of coronary artery calcification, persistent A-fib diagnosed in 08/2022 on apixaban, HFpEF, aortic atherosclerosis, CVA, prediabetes, HTN, HLD, OSA on CPAP, and GERD who presents for follow-up of A-fib.  Remote pharmacologic MPI in 07/2010 showed a small, fixed apical defect with an LVEF of 44% with global hypokinesis with more severe hypokinesis at the apex.   She was evaluated by Dr. Okey Dupre in 10/2019 and reported relatively sudden onset of shortness of breath that began in 09/2019. Echo in 11/2019 showed an EF of 60 to 65%, grade 1 diastolic dysfunction, normal RV systolic function and ventricular cavity size, mild left atrial enlargement, and mild mitral regurgitation with moderate mitral annular calcification. She underwent Lexiscan MPI on 12/13/2019 that showed no significant ischemia with normal wall motion and an EF estimated at 40% felt to be depressed secondary to GI uptake artifact. CT attenuation corrected images showed mild to moderate aortic atherosclerosis with significant mitral valve annular calcification and mild coronary artery calcification. Overall, this was a low risk scan.  Carvedilol has previously been discontinued in the setting of bradycardia with concern for junctional escape versus ectopic atrial rhythm.  She was evaluated in 08/2022 for routine follow-up and noted to be in new onset A-fib with controlled ventricular response of uncertain chronicity.  Given a CHA2DS2-VASc of at least 7, she was started on apixaban 5 mg twice daily.  Subsequent Zio patch showed a predominant rhythm of A-fib with an average ventricular rate of 68 bpm (range 39 to 156 bpm), 2 episodes of NSVT  lasting up to 6 beats, and no prolonged pauses.  Analysis was limited by artifact and brief monitoring duration.  Echo in 09/2022 demonstrated an EF of 60 to 65%, severe LVH without LVOT obstruction, no regional wall motion abnormalities, normal RV systolic function with mildly enlarged ventricular cavity size, moderately elevated PASP estimated at 47.5 mmHg, severe biatrial enlargement, mild mitral regurgitation, aortic valve sclerosis without evidence of stenosis, and an estimated right atrial pressure of 15 mmHg.  Cardiac MRI was recommended and is pending.  She comes in accompanied by her daughter today and is doing very well from a cardiac perspective.  She is without symptoms of angina or cardiac decompensation.  No dyspnea, palpitations, dizziness, presyncope, or syncope.  No falls, hematochezia, or melena.  Adherent and tolerating apixaban.  Weight is down 8 pounds when compared to last clinic visit.  No lower extremity swelling or progressive orthopnea.  She does not have any acute cardiac concerns at this time.   Labs independently reviewed: 09/2022 - albumin 3.8, AST/ALT normal, potassium 3.7, BUN 17, serum creatinine 0.73 08/2022 - Hgb 10.3, PLT 144, magnesium 1.9, TSH normal 03/2021 - TC 122, TG 47, HDL 56, LDL 57  Past Medical History:  Diagnosis Date   Arthritis of both knees 08/01/2015   Colitis    Essential hypertension, benign 07/28/2014   GERD (gastroesophageal reflux disease)    Hx of iron deficiency anemia 09/02/2015   Hyperlipidemia    Hypertension    Lumbar pain    Medicare annual wellness visit, subsequent 04/07/2017   OSA on CPAP 08/01/2015   Overweight (BMI 25.0-29.9) 07/10/2013   Prediabetes 09/02/2015  SOB (shortness of breath) 07/10/2013    Past Surgical History:  Procedure Laterality Date   ABDOMINAL HYSTERECTOMY     CATARACT EXTRACTION     CHOLECYSTECTOMY     COLONOSCOPY WITH PROPOFOL N/A 04/09/2018   Procedure: COLONOSCOPY WITH PROPOFOL;  Surgeon: Toney Reil,  MD;  Location: [facility] END[facility]COPY;  Service: Gastroenterology;  Laterality: N/A;   LUMBAR DISC SURGERY      Current Medications: Current Meds  Medication Sig   albuterol (VENTOLIN HFA) 108 (90 Base) MCG/ACT inhaler Inhale 1 puff into the lungs as needed.   alendronate (FOSAMAX) 70 MG tablet Take 70 mg by mouth once a week.   apixaban (ELIQUIS) 5 MG TABS tablet Take 1 tablet (5 mg total) by mouth 2 (two) times daily.   atorvastatin (LIPITOR) 10 MG tablet Take 1 tablet (10 mg total) by mouth daily.   calcium citrate-vitamin D 500-400 MG-UNIT chewable tablet Chew 1 tablet by mouth 2 (two) times daily.    ferrous sulfate 325 (65 FE) MG tablet Take 1 tablet (325 mg total) by mouth daily.   HYDROcodone-acetaminophen (NORCO/VICODIN) 5-325 MG tablet Take 1 tablet by mouth every 12 (twelve) hours as needed for severe pain. Must last 30 days.   isosorbide mononitrate (IMDUR) 120 MG 24 hr tablet TAKE 1 TABLET BY MOUTH EVERY DAY   Olmesartan-amLODIPine-HCTZ 40-10-12.5 MG TABS Take 1 tablet by mouth daily. For blood pressure   omeprazole (PRILOSEC) 20 MG capsule Take 20 mg by mouth daily.    Allergies:   Patient has no known allergies.   Social History   Socioeconomic History   Marital status: Widowed    Spouse name: Not on file   Number of children: 5   Years of education: Not on file   Highest education level: Not on file  Occupational History   Not on file  Tobacco Use   Smoking status: Never   Smokeless tobacco: Never  Vaping Use   Vaping status: Never Used  Substance and Sexual Activity   Alcohol use: No    Alcohol/week: 0.0 standard drinks of alcohol   Drug use: No   Sexual activity: Not Currently  Other Topics Concern   Not on file  Social History Narrative   Not on file   Social Determinants of Health   Financial Resource Strain: Medium Risk (07/26/2018)   Overall Financial Resource Strain (CARDIA)    Difficulty of Paying Living Expenses: Somewhat hard  Food Insecurity: Food  Insecurity Present (07/26/2018)   Hunger Vital Sign    Worried About Running Out of Food in the Last Year: Sometimes true    Ran Out of Food in the Last Year: Never true  Transportation Needs: No Transportation Needs (07/26/2018)   PRAPARE - Administrator, Civil Service (Medical): No    Lack of Transportation (Non-Medical): No  Physical Activity: Inactive (07/26/2018)   Exercise Vital Sign    Days of Exercise per Week: 0 days    Minutes of Exercise per Session: 0 min  Stress: No Stress Concern Present (07/26/2018)   [employer] of Occupational Health - Occupational Stress Questionnaire    Feeling of Stress : Not at all  Social Connections: Moderately Isolated (07/26/2018)   Social Connection and Isolation Panel [NHANES]    Frequency of Communication with Friends and Family: More than three times a week    Frequency of Social Gatherings with Friends and Family: Three times a week    Attends Religious Services: More than 4 times  per year    Active Member of Clubs or Organizations: No    Attends Banker Meetings: Never    Marital Status: Widowed     Family History:  The patient's family history includes Anuerysm in her son; Cancer in her daughter and father; Diabetes in her daughter and mother; Heart disease in her mother; Hypertension in her daughter. There is no history of Breast cancer.  ROS:   12-point review of systems is negative unless otherwise noted in the HPI.   EKGs/Labs/Other Studies Reviewed:    Studies reviewed were summarized above. The additional studies were reviewed today:  2D echo 11/01/2019: 1. Left ventricular ejection fraction, by estimation, is 60 to 65%. The  left ventricle has normal function. The left ventricle has no regional  wall motion abnormalities. There is severe left ventricular hypertrophy.  Left ventricular diastolic parameters   are consistent with Grade I diastolic dysfunction (impaired relaxation).   2. Right  ventricular systolic function is normal. The right ventricular  size is normal. Tricuspid regurgitation signal is inadequate for assessing  PA pressure.   3. Left atrial size was moderately dilated.   4. Mild mitral valve regurgitation. Moderate mitral annular calcification. __________   Eugenie Birks MPI 12/13/2019: Pharmacological myocardial perfusion imaging study with no significant  ischemia Normal wall motion, EF estimated at 43% (likely depressed secondary to GI uptake artifact) No EKG changes concerning for ischemia at peak stress or in recovery. CT attenuation correction images with mild to moderate aortic atherosclerosis, significant mitral valve annular calcification, mild coronary calcification Low risk scan __________  Luci Bank patch 08/2022:   The patient was monitored for 2 days, 10 hours.  Due to artifact, only 1 day, 23 hours were adequate for analysis.   The predominant rhythm was atrial fibrillation with an average ventricular rate of 68 bpm (range 39-156 bpm).   There were rare PVCs.   Two episodes of nonsustained ventricular tachycardia were observed.  The longest episode lasted at least 6 beats, but may have been longer as onset is difficult to determine due to artifact.  The maximum ventricular rate was 156 bpm.   No prolonged pause was observed.   There were no patient triggered events.   Persistent atrial fibrillation with nonsustained ventricular tachycardia, as outlined above.  Analysis limited by artifact and brief monitoring duration. ___________  2D echo 09/05/2022: 1. Severe LVH. No LVOT obstruction. consider CMR to r/o HCM and  infiltrative dz if clinically appropriate.. Left ventricular ejection  fraction, by estimation, is 60 to 65%. The left ventricle has normal  function. The left ventricle has no regional wall  motion abnormalities. There is severe left ventricular hypertrophy. Left  ventricular diastolic parameters are indeterminate.   2. Right ventricular  systolic function is normal. The right ventricular  size is mildly enlarged. There is moderately elevated pulmonary artery  systolic pressure.   3. Left atrial size was severely dilated.   4. Right atrial size was severely dilated.   5. The mitral valve is degenerative. Mild mitral valve regurgitation.   6. The aortic valve is tricuspid. Aortic valve regurgitation is not  visualized. Aortic valve sclerosis/calcification is present, without any  evidence of aortic stenosis.   7. The inferior vena cava is dilated in size with <50% respiratory  variability, suggesting right atrial pressure of 15 mmHg.     EKG:  EKG is ordered today.  The EKG ordered today demonstrates Afib, 81 bpm, LVH, possible prior septal infarct, nonspecific ST/T changes, consistent  with prior tracing  Recent Labs: 08/09/2022: Hemoglobin 10.3; Magnesium 1.9; Platelets 144; TSH 0.686 09/05/2022: ALT 40; BUN 17; Creatinine, Ser 0.73; Potassium 3.7; Sodium 138  Recent Lipid Panel    Component Value Date/Time   CHOL 122 03/31/2021 1040   CHOL 128 03/02/2020 1051   TRIG 47 03/31/2021 1040   HDL 56 03/31/2021 1040   HDL 55 03/02/2020 1051   CHOLHDL 2.2 03/31/2021 1040   VLDL 9 03/31/2021 1040   LDLCALC 57 03/31/2021 1040   LDLCALC 60 03/02/2020 1051   LDLCALC 120 (H) 08/01/2018 1544   LDLDIRECT 65 03/02/2020 1051    PHYSICAL EXAM:    VS:  BP (!) 169/73 (BP Location: Right Arm, Patient Position: Sitting, Cuff Size: Normal)   Pulse 81   Ht 5' (1.524 m)   Wt 149 lb 3.2 oz (67.7 kg)   SpO2 96%   BMI 29.14 kg/m   BMI: Body mass index is 29.14 kg/m.  Physical Exam Vitals reviewed.  Constitutional:      Appearance: She is well-developed.  HENT:     Head: Normocephalic and atraumatic.  Eyes:     General:        Right eye: No discharge.        Left eye: No discharge.  Neck:     Vascular: No JVD.  Cardiovascular:     Rate and Rhythm: Normal rate. Rhythm irregularly irregular.     Pulses:          Posterior  tibial pulses are 2+ on the right side and 2+ on the left side.     Heart sounds: S1 normal and S2 normal. Heart sounds not distant. No midsystolic click and no opening snap. Murmur heard.     Systolic murmur is present with a grade of 1/6.     No friction rub.  Pulmonary:     Effort: Pulmonary effort is normal. No respiratory distress.     Breath sounds: Normal breath sounds. No decreased breath sounds, wheezing or rales.  Chest:     Chest wall: No tenderness.  Abdominal:     General: There is no distension.  Musculoskeletal:     Cervical back: Normal range of motion.     Right lower leg: No edema.     Left lower leg: No edema.  Skin:    General: Skin is warm and dry.     Nails: There is no clubbing.  Neurological:     Mental Status: She is alert and oriented to person, place, and time.  Psychiatric:        Speech: Speech normal.        Behavior: Behavior normal.        Thought Content: Thought content normal.        Judgment: Judgment normal.     Wt Readings from Last 3 Encounters:  09/19/22 149 lb 3.2 oz (67.7 kg)  08/24/22 147 lb (66.7 kg)  08/09/22 157 lb (71.2 kg)     ASSESSMENT & PLAN:   Persistent A-fib: She remains in A-fib with controlled ventricular response, not currently on AV nodal blocking medications and is asymptomatic.  Prior provider has referred the patient to EP for evaluation of A-fib, suspect we will plan to pursue rate control given her advanced age and asymptomatic rhythm.  CHA2DS2-VASc at least 7.  Remains on apixaban 5 mg twice daily and does not meet reduced dosing criteria.  No falls or symptoms concerning for bleeding.  Recent renal function and electrolytes  stable.  Check CBC.  Coronary artery calcification/aortic atherosclerosis/HLD: Without symptoms of angina or cardiac decompensation.  LDL 57 in 03/2021 with normal AST/ALT in 09/2022.  On apixaban place of aspirin given underlying A-fib.  Remains on atorvastatin and Imdur.  Severe LVH: Initially  noted by echo in 2021.  Most recent echo again shows significant LVH without LVOT obstruction.  Query if this is in the setting of hypertensive heart disease.  Prior provider has ordered a cardiac MRI.  HFpEF: Weight is down 8 pounds today when compared to last clinic visit.  In this setting, deferred escalation of diuretic therapy.  HTN: Blood pressure is elevated in the office today, though she has not yet taken her medications.  She remains on Imdur, olmesartan, amlodipine, and HCTZ.  No longer on beta-blocker secondary to history of junctional versus ectopic atrial rhythm.    Disposition: F/u with Dr. Okey Dupre or an APP in 6 months.   Medication Adjustments/Labs and Tests Ordered: Current medicines are reviewed at length with the patient today.  Concerns regarding medicines are outlined above. Medication changes, Labs and Tests ordered today are summarized above and listed in the Patient Instructions accessible in Encounters.   Signed, Eula Listen, PA-C 09/19/2022 9:42 AM     [facility] [place] [address] [place], [name] [address] [phone]

## 2022-09-19 ENCOUNTER — Encounter: Payer: Self-pay | Admitting: Physician Assistant

## 2022-09-19 ENCOUNTER — Ambulatory Visit: Payer: 59 | Attending: Medical | Admitting: Physician Assistant

## 2022-09-19 VITALS — BP 169/73 | HR 81 | Ht 60.0 in | Wt 149.2 lb

## 2022-09-19 DIAGNOSIS — I700 Atherosclerosis of aorta: Secondary | ICD-10-CM | POA: Diagnosis not present

## 2022-09-19 DIAGNOSIS — I4819 Other persistent atrial fibrillation: Secondary | ICD-10-CM | POA: Diagnosis not present

## 2022-09-19 DIAGNOSIS — I517 Cardiomegaly: Secondary | ICD-10-CM

## 2022-09-19 DIAGNOSIS — I10 Essential (primary) hypertension: Secondary | ICD-10-CM

## 2022-09-19 DIAGNOSIS — I2510 Atherosclerotic heart disease of native coronary artery without angina pectoris: Secondary | ICD-10-CM | POA: Diagnosis not present

## 2022-09-19 DIAGNOSIS — I2584 Coronary atherosclerosis due to calcified coronary lesion: Secondary | ICD-10-CM

## 2022-09-19 DIAGNOSIS — E785 Hyperlipidemia, unspecified: Secondary | ICD-10-CM

## 2022-09-19 DIAGNOSIS — I5032 Chronic diastolic (congestive) heart failure: Secondary | ICD-10-CM

## 2022-09-19 LAB — CBC
Hematocrit: 39.6 % (ref 34.0–46.6)
Hemoglobin: 12.5 g/dL (ref 11.1–15.9)
MCH: 27.3 pg (ref 26.6–33.0)
MCHC: 31.6 g/dL (ref 31.5–35.7)
MCV: 87 fL (ref 79–97)
Platelets: 169 10*3/uL (ref 150–450)
RBC: 4.58 x10E6/uL (ref 3.77–5.28)
RDW: 12.9 % (ref 11.7–15.4)
WBC: 4.2 10*3/uL (ref 3.4–10.8)

## 2022-09-19 NOTE — Patient Instructions (Signed)
Medication Instructions:  Your Physician recommend you continue on your current medication as directed.     *If you need a refill on your cardiac medications before your next appointment, please call your pharmacy*   Lab Work: Your provider would like for you to have following labs drawn today CBC.    If you have labs (blood work) drawn today and your tests are completely normal, you will receive your results only by: MyChart Message (if you have MyChart) OR A paper copy in the mail If you have any lab test that is abnormal or we need to change your treatment, we will call you to review the results.   Testing/Procedures: None   Follow-Up: At [facility], you and your health needs are our priority.  As part of our continuing mission to provide you with exceptional heart care, we have created designated Provider Care Teams.  These Care Teams include your primary Cardiologist (physician) and Advanced Practice Providers (APPs -  Physician Assistants and Nurse Practitioners) who all work together to provide you with the care you need, when you need it.  We recommend signing up for the patient portal called "MyChart".  Sign up information is provided on this After Visit Summary.  MyChart is used to connect with patients for Virtual Visits (Telemedicine).  Patients are able to view lab/test results, encounter notes, upcoming appointments, etc.  Non-urgent messages can be sent to your provider as well.   To learn more about what you can do with MyChart, go to [email or web address].    Your next appointment:   6 month(s)  Provider:   You may see Yvonne Kendall, MD or one of the following Advanced Practice Providers on your designated Care Team:    Cadence Pence, [place]

## 2022-09-20 ENCOUNTER — Telehealth: Payer: Self-pay | Admitting: Physician Assistant

## 2022-09-20 NOTE — Telephone Encounter (Signed)
This RN received call back from last msg left - this RN didn't know that another RN had already shared results, pt's daughter, Ladean Raya, thanked [place] for the call back and acknowledged results and confirmed future appts

## 2022-09-20 NOTE — Telephone Encounter (Signed)
Sondra Barges, PA-C 09/20/2022  8:12 AM EDT     Please inform the patient her blood count is normal with improved hemoglobin.  No changes in current medications.

## 2022-09-20 NOTE — Telephone Encounter (Signed)
Attempted to contact the patient. °No answer- I left a message to please call back.  °

## 2022-09-20 NOTE — Telephone Encounter (Signed)
Pt's daughter calling nurse back. Please advise

## 2022-09-20 NOTE — Telephone Encounter (Signed)
Spoke to patient's daughter and informed her recent lab results as interpreted by Eula Listen, PA-C as follows:  "Please inform the patient her blood count is normal with improved hemoglobin.  No changes in current medications."  Patient's daughter understood with read back

## 2022-09-27 ENCOUNTER — Ambulatory Visit
Payer: 59 | Attending: Student in an Organized Health Care Education/Training Program | Admitting: Student in an Organized Health Care Education/Training Program

## 2022-09-27 ENCOUNTER — Telehealth: Payer: Self-pay | Admitting: *Deleted

## 2022-09-27 ENCOUNTER — Encounter: Payer: Self-pay | Admitting: Student in an Organized Health Care Education/Training Program

## 2022-09-27 VITALS — BP 148/74 | HR 82 | Temp 98.1°F | Resp 16 | Ht 61.0 in | Wt 149.0 lb

## 2022-09-27 DIAGNOSIS — M5136 Other intervertebral disc degeneration, lumbar region: Secondary | ICD-10-CM | POA: Diagnosis not present

## 2022-09-27 DIAGNOSIS — M170 Bilateral primary osteoarthritis of knee: Secondary | ICD-10-CM | POA: Insufficient documentation

## 2022-09-27 DIAGNOSIS — M47818 Spondylosis without myelopathy or radiculopathy, sacral and sacrococcygeal region: Secondary | ICD-10-CM | POA: Insufficient documentation

## 2022-09-27 DIAGNOSIS — G894 Chronic pain syndrome: Secondary | ICD-10-CM | POA: Diagnosis not present

## 2022-09-27 MED ORDER — HYDROCODONE-ACETAMINOPHEN 5-325 MG PO TABS: 1.0000 | ORAL_TABLET | Freq: Two times a day (BID) | ORAL | 0 refills | Status: AC | PRN

## 2022-09-27 NOTE — Progress Notes (Signed)
PROVIDER NOTE: Information contained herein reflects review and annotations entered in association with encounter. Interpretation of such information and data should be left to medically-trained personnel. Information provided to patient can be located elsewhere in the medical record under "Patient Instructions". Document created using STT-dictation technology, any transcriptional errors that may result from process are unintentional.    Patient: Jocelyn [name]  Service Category: E/M  Provider: Edward Jolly, MD  DOB: [birth year]  DOS: 09/27/2022  Specialty: Interventional Pain Management  MRN: [id number]  Setting: Ambulatory outpatient  PCP: Gorden Harms, PA-C  Type: Established Patient    Referring Provider: Gorden Harms, PA-C  Location: Office  Delivery: Face-to-face     HPI  Jocelyn [name], a 89 y.o. year old female, is here today because of her Bilateral primary osteoarthritis of knee [M17.0]. Jocelyn [name] primary complain today is Knee Pain (Bilateral )  Last encounter: My last encounter with her was on 08/02/22  Pertinent problems: Jocelyn [name] has Lumbar degenerative disc disease; Hearing loss; Cerebrovascular accident (CVA) (HCC); Controlled substance agreement signed; Bilateral primary osteoarthritis of knee; Carpal tunnel syndrome on right; SI joint arthritis; Chronic pain of both knees; and Chronic pain syndrome on their pertinent problem list. Pain Assessment: Severity of Chronic pain is reported as a 0-No pain (pain meds at 0330 for knee pain.)/10. Location: Knee Left, Right/denies. Onset: More than a month ago. Quality: Aching, Throbbing, Discomfort, Constant. Timing: Constant. Modifying factor(s): pain medications. Vitals:  height is 5\' 1"  (1.549 m) and weight is 149 lb (67.6 kg). Her temporal temperature is 98.1 F (36.7 C). Her blood pressure is 148/74 (abnormal) and her pulse is 82. Her respiration is 16 and oxygen saturation is 96%.   Reason for encounter: medication  management.    Parasthesias of bilateral feet No falls since last visit Benefit with previous knee injection  Pharmacotherapy Assessment  Analgesic: Hydrocodone 5 mg BID prn #60/month   Monitoring: [place] PMP: PDMP reviewed during this encounter.       Pharmacotherapy: No side-effects or adverse reactions reported. Compliance: No problems identified. Effectiveness: Clinically acceptable.  UDS:  Summary  Date Value Ref Range Status  04/21/2022 Note  Final    Comment:    ==================================================================== [employer] Select 13 (MW) ==================================================================== Test                             Result       Flag       Units  Drug Present and Declared for Prescription Verification   Hydrocodone                    664          EXPECTED   ng/mg creat   Hydromorphone                  258          EXPECTED   ng/mg creat   Dihydrocodeine                 59           EXPECTED   ng/mg creat   Norhydrocodone                 442          EXPECTED   ng/mg creat    Sources of hydrocodone include scheduled prescription medications.    Hydromorphone, dihydrocodeine and norhydrocodone are expected  metabolites of hydrocodone. Hydromorphone and dihydrocodeine are    also available as scheduled prescription medications.  ==================================================================== Test                      Result    Flag   Units      Ref Range   Creatinine              104              mg/dL      >=81 ==================================================================== Declared Medications:  The flagging and interpretation on this report are based on the  following declared medications.  Unexpected results may arise from  inaccuracies in the declared medications.   **Note: The testing scope of this panel includes these medications:   Hydrocodone (Norco)   **Note: The testing scope of this panel does not include  the  following reported medications:   Acetaminophen (Norco)  Albuterol  Alendronate (Fosamax)  Amlodipine  Aspirin  Atorvastatin  Calcium  Diclofenac (Voltaren)  Iron  Isosorbide (Imdur)  Olmesartan  Omeprazole  Vitamin D ==================================================================== For clinical consultation, please call [phone]. ====================================================================       ROS  Constitutional: Denies any fever or chills Gastrointestinal: No reported hemesis, hematochezia, vomiting, or acute GI distress Musculoskeletal:  Low back, bilateral foot pain Neurological: No reported episodes of acute onset apraxia, aphasia, dysarthria, agnosia, amnesia, paralysis, loss of coordination, or loss of consciousness  Medication Review  HYDROcodone-acetaminophen, Olmesartan-amLODIPine-HCTZ, albuterol, alendronate, apixaban, calcium citrate-vitamin D, ferrous sulfate, isosorbide mononitrate, and omeprazole  History Review  Allergy: Jocelyn [name] has No Known Allergies. Drug: Jocelyn [name]  reports no history of drug use. Alcohol:  reports no history of alcohol use. Tobacco:  reports that she has never smoked. She has never used smokeless tobacco. Social: Jocelyn [name]  reports that she has never smoked. She has never used smokeless tobacco. She reports that she does not drink alcohol and does not use drugs. Medical:  has a past medical history of Arthritis of both knees (08/01/2015), Colitis, Essential hypertension, benign (07/28/2014), GERD (gastroesophageal reflux disease), iron deficiency anemia (09/02/2015), Hyperlipidemia, Hypertension, Lumbar pain, Medicare annual wellness visit, subsequent (04/07/2017), OSA on CPAP (08/01/2015), Overweight (BMI 25.0-29.9) (07/10/2013), Prediabetes (09/02/2015), and SOB (shortness of breath) (07/10/2013). Surgical: Jocelyn [name]  has a past surgical history that includes Cataract extraction; Abdominal hysterectomy; Cholecystectomy;  Lumbar disc surgery; and Colonoscopy with propofol (N/A, 04/09/2018). Family: family history includes Anuerysm in her son; Cancer in her daughter and father; Diabetes in her daughter and mother; Heart disease in her mother; Hypertension in her daughter.  Laboratory Chemistry Profile   Renal Lab Results  Component Value Date   BUN 17 09/05/2022   CREATININE 0.73 09/05/2022   BCR NOT APPLICABLE 08/01/2018   GFRAA 78 08/01/2018   GFRNONAA >60 09/05/2022     Hepatic Lab Results  Component Value Date   AST 31 09/05/2022   ALT 40 09/05/2022   ALBUMIN 3.8 09/05/2022   ALKPHOS 40 09/05/2022     Electrolytes Lab Results  Component Value Date   NA 138 09/05/2022   K 3.7 09/05/2022   CL 107 09/05/2022   CALCIUM 9.4 09/05/2022   MG 1.9 08/09/2022     Bone No results found for: "VD25OH", "VD125OH2TOT", "OZ3086VH8", "IO9629BM8", "25OHVITD1", "25OHVITD2", "25OHVITD3", "TESTOFREE", "TESTOSTERONE"   Inflammation (CRP: Acute Phase) (ESR: Chronic Phase) No results found for: "CRP", "ESRSEDRATE", "LATICACIDVEN"     Note: Above Lab results reviewed.  Recent Imaging Review  ECHOCARDIOGRAM COMPLETE    ECHOCARDIOGRAM REPORT       Patient Name:   JIOVANNA DROLLINGER Date of Exam: 09/05/2022 Medical Rec #:  [id number]        Height:       60.0 in Accession #:    [id number]       Weight:       147.0 lb Date of Birth:  16-Apr-1931        BSA:          1.638 m Patient Age:    89 years         BP:           167/66 mmHg Patient Gender: F                HR:           68 bpm. Exam Location:  [place]  Procedure: 2D Echo, Cardiac Doppler, Color Doppler and Strain Analysis  Indications:    I48.91* Unspeicified atrial fibrillation; R60.0 Lower extremity                 edema   History:        Patient has prior history of Echocardiogram examinations, most                 recent 11/01/2019. Stroke, Arrythmias:Atrial Fibrillation,                 Signs/Symptoms:Shortness of Breath, Edema, Chest Pain  and                 Murmur; Risk Factors:Non-Smoker, Sleep Apnea, Dyslipidemia and                 Hypertension.   Sonographer:    Ilda Mori RDCS, MHA Referring Phys: [phone] CADENCE H FURTH    Sonographer Comments: Global longitudinal strain was attempted. IMPRESSIONS   1. Severe LVH. No LVOT obstruction. consider CMR to r/o HCM and infiltrative dz if clinically appropriate.. Left ventricular ejection fraction, by estimation, is 60 to 65%. The left ventricle has normal function. The left ventricle has no regional wall  motion abnormalities. There is severe left ventricular hypertrophy. Left ventricular diastolic parameters are indeterminate.  2. Right ventricular systolic function is normal. The right ventricular size is mildly enlarged. There is moderately elevated pulmonary artery systolic pressure.  3. Left atrial size was severely dilated.  4. Right atrial size was severely dilated.  5. The mitral valve is degenerative. Mild mitral valve regurgitation.  6. The aortic valve is tricuspid. Aortic valve regurgitation is not visualized. Aortic valve sclerosis/calcification is present, without any evidence of aortic stenosis.  7. The inferior vena cava is dilated in size with <50% respiratory variability, suggesting right atrial pressure of 15 mmHg.  FINDINGS  Left Ventricle: Severe LVH. No LVOT obstruction. consider CMR to r/o HCM and infiltrative dz if clinically appropriate. Left ventricular ejection fraction, by estimation, is 60 to 65%. The left ventricle has normal function. The left ventricle has no  regional wall motion abnormalities. Global longitudinal strain performed but not reported based on interpreter judgement due to suboptimal tracking. The left ventricular internal cavity size was normal in size. There is severe left ventricular  hypertrophy. Left ventricular diastolic parameters are indeterminate.  Right Ventricle: The right ventricular size is mildly enlarged. No  increase in right ventricular wall thickness. Right ventricular systolic function is normal. There is moderately elevated pulmonary artery systolic pressure. The tricuspid regurgitant  velocity is 2.85 m/s,  and with an assumed right atrial pressure of 15 mmHg, the estimated right ventricular systolic pressure is 47.5 mmHg.  Left Atrium: Left atrial size was severely dilated.  Right Atrium: Right atrial size was severely dilated.  Pericardium: There is no evidence of pericardial effusion.  Mitral Valve: The mitral valve is degenerative in appearance. Mild mitral valve regurgitation.  Tricuspid Valve: The tricuspid valve is normal in structure. Tricuspid valve regurgitation is mild.  Aortic Valve: The aortic valve is tricuspid. Aortic valve regurgitation is not visualized. Aortic valve sclerosis/calcification is present, without any evidence of aortic stenosis.  Pulmonic Valve: The pulmonic valve was normal in structure. Pulmonic valve regurgitation is mild.  Aorta: The aortic root and ascending aorta are structurally normal, with no evidence of dilitation.  Venous: The inferior vena cava is dilated in size with less than 50% respiratory variability, suggesting right atrial pressure of 15 mmHg.  IAS/Shunts: No atrial level shunt detected by color flow Doppler.    LEFT VENTRICLE PLAX 2D LVIDd:         4.30 cm LVIDs:         2.50 cm LV PW:         1.60 cm LV IVS:        1.60 cm    RIGHT VENTRICLE             IVC RV Basal diam:  4.40 cm     IVC diam: 2.40 cm RV S prime:     12.60 cm/s TAPSE (M-mode): 2.0 cm  LEFT ATRIUM              Index         RIGHT ATRIUM           Index LA diam:        5.20 cm  3.18 cm/m    RA Area:     22.20 cm LA Vol (A2C):   170.0 ml 103.81 ml/m  RA Volume:   80.80 ml  49.34 ml/m LA Vol (A4C):   92.7 ml  56.60 ml/m LA Biplane Vol: 135.0 ml 82.43 ml/m    AORTA Ao Root diam: 2.60 cm Ao Asc diam:  3.40 cm  TRICUSPID VALVE TR Peak grad:   32.5  mmHg TR Vmax:        285.00 cm/s  Debbe Odea MD Electronically signed by Debbe Odea MD Signature Date/Time: 09/05/2022/5:46:34 PM      Final    Note: Reviewed        Physical Exam  General appearance: Well nourished, well developed, and well hydrated. In no apparent acute distress Mental status: Alert, oriented x 3 (person, place, & time)       Respiratory: No evidence of acute respiratory distress Eyes: PERLA Vitals: BP (!) 148/74 (BP Location: Right Arm, Patient Position: Sitting, Cuff Size: Normal)   Pulse 82   Temp 98.1 F (36.7 C) (Temporal)   Resp 16   Ht 5\' 1"  (1.549 m)   Wt 149 lb (67.6 kg)   SpO2 96%   BMI 28.15 kg/m  BMI: Estimated body mass index is 28.15 kg/m as calculated from the following:   Height as of this encounter: 5\' 1"  (1.549 m).   Weight as of this encounter: 149 lb (67.6 kg). Ideal: Ideal body weight: 47.8 kg (105 lb 6.1 oz) Adjusted ideal body weight: 55.7 kg (122 lb 13.2 oz)  Patient presents today in wheelchair.  Low back pain, bilateral knee pain.  Assessment   Diagnosis  1. Bilateral primary osteoarthritis of knee   2. Lumbar degenerative disc disease   3. SI joint arthritis   4. Chronic pain syndrome           Plan of Care  Jocelyn [name] has a current medication list which includes the following long-term medication(s): apixaban, calcium citrate-vitamin d, ferrous sulfate, isosorbide mononitrate, and olmesartan-amlodipine-hctz.  Pharmacotherapy (Medications Ordered): Meds ordered this encounter  Medications   HYDROcodone-acetaminophen (NORCO/VICODIN) 5-325 MG tablet    Sig: Take 1 tablet by mouth every 12 (twelve) hours as needed for severe pain. Must last 30 days.    Dispense:  60 tablet    Refill:  0    Chronic Pain: STOP Act (Not applicable) Fill 1 day early if closed on refill date. Avoid benzodiazepines within 8 hours of opioids   No orders of the defined types were placed in this  encounter.     Follow-up plan:   Return for patient will call to schedule F2F appt prn.   Recent Visits Date Type Provider Dept  08/24/22 Procedure visit Edward Jolly, MD [facility]  08/02/22 Office Visit Edward Jolly, MD [facility]  Showing recent visits within past 90 days and meeting all other requirements Today's Visits Date Type Provider Dept  09/27/22 Office Visit Edward Jolly, MD [facility]  Showing today's visits and meeting all other requirements Future Appointments No visits were found meeting these conditions. Showing future appointments within next 90 days and meeting all other requirements  I discussed the assessment and treatment plan with the patient. The patient was provided an opportunity to ask questions and all were answered. The patient agreed with the plan and demonstrated an understanding of the instructions.  Patient advised to call back or seek an in-person evaluation if the symptoms or condition worsens.  Duration of encounter: .  Note by: Edward Jolly, MD Date: 09/27/2022; Time: 10:56 AM

## 2022-09-27 NOTE — Telephone Encounter (Signed)
Called to [facility] to cancel existing hydro - apap 5-325 mg dt not having qty.

## 2022-09-27 NOTE — Progress Notes (Signed)
Nursing Pain Medication Assessment:  Safety precautions to be maintained throughout the outpatient stay will include: orient to surroundings, keep bed in low position, maintain call bell within reach at all times, provide assistance with transfer out of bed and ambulation.  Medication Inspection Compliance: Pill count conducted under aseptic conditions, in front of the patient. Neither the pills nor the bottle was removed from the patient's sight at any time. Once count was completed pills were immediately returned to the patient in their original bottle.  Medication: Hydrocodone/APAP Pill/Patch Count:  1 of 60 pills remain Pill/Patch Appearance: Markings consistent with prescribed medication Bottle Appearance: Standard pharmacy container. Clearly labeled. Filled Date: 07 / 09 / 2024 Last Medication intake:  Today

## 2022-09-28 ENCOUNTER — Encounter: Payer: Self-pay | Admitting: Cardiology

## 2022-09-28 ENCOUNTER — Ambulatory Visit: Payer: 59 | Attending: Cardiology | Admitting: Cardiology

## 2022-09-28 VITALS — BP 138/60 | HR 74 | Ht 60.0 in | Wt 149.0 lb

## 2022-09-28 DIAGNOSIS — I4819 Other persistent atrial fibrillation: Secondary | ICD-10-CM | POA: Diagnosis not present

## 2022-09-28 DIAGNOSIS — I5032 Chronic diastolic (congestive) heart failure: Secondary | ICD-10-CM | POA: Diagnosis not present

## 2022-09-28 DIAGNOSIS — I10 Essential (primary) hypertension: Secondary | ICD-10-CM

## 2022-09-28 NOTE — Progress Notes (Signed)
  Electrophysiology Office Note:    Date:  09/28/2022   ID:  Jocelyn, [name] Aug 30, 1931, MRN [id number]  [facility] Cardiologist:  Yvonne Kendall, MD  [facility] Electrophysiologist:  Lanier Prude, MD   Referring MD: Jocelyn Harms, PA-C   Chief Complaint: Persistent atrial fibrillation  History of Present Illness:    Jocelyn [name] is a 89 y.o. femalewho I am seeing today for an evaluation of persistent atrial fibrillation at the request of Dr. Okey [name].  The patient was last seen by Jocelyn [name] on September 19, 2022.  The patient has a medical history that includes persistent atrial fibrillation on Eliquis, chronic diastolic heart failure, stroke, prediabetes, hypertension, hyperlipidemia, sleep apnea on CPAP, GERD.  At the appointment with Jocelyn [name], she was doing well.  She was tolerating her Eliquis.  At that appointment she remained in atrial fibrillation.  She was asymptomatic.  Rate control was being pursued.  She is with her daughter today in clinic.  She cannot appreciate that her heart is out of rhythm.  She has been told for years that she has an irregular heartbeat.  She is asymptomatic while in atrial fibrillation.  She has had trouble with blood pressure control for many years.        Their past medical, social and family history was reveiwed.   ROS:   Please see the history of present illness.    All other systems reviewed and are negative.  EKGs/Labs/Other Studies Reviewed:    The following studies were reviewed today:  September 05, 2022 echo Severe LVH EF 60 to 65% RV function normal Severely dilated left and right atrium Mild MR  August 26, 2022 ZIO 100% A-fib burden, average rate 68 bpm      Physical Exam:    VS:  BP 138/60 (BP Location: Left Arm, Patient Position: Sitting, Cuff Size: Normal)   Pulse 74   Ht 5' (1.524 m)   Wt 149 lb (67.6 kg)   SpO2 95%   BMI 29.10 kg/m     Wt Readings from Last 3 Encounters:  09/28/22 149 lb (67.6 kg)   09/27/22 149 lb (67.6 kg)  09/19/22 149 lb 3.2 oz (67.7 kg)     GEN:  Well nourished, well developed in no acute distress.  Elderly CARDIAC: Irregularly irregular, no murmurs, rubs, gallops RESPIRATORY:  Clear to auscultation without rales, wheezing or rhonchi       ASSESSMENT AND PLAN:    1. Persistent atrial fibrillation (HCC)   2. Primary hypertension   3. Chronic heart failure with preserved ejection fraction (HCC)     #Permanent atrial fibrillation Continue Eliquis for stroke prophylaxis She is asymptomatic and well rate controlled  #Hypertension At goal today.  Recommend checking blood pressures 1-2 times per week at home and recording the values.  Recommend bringing these recordings to the primary care physician. Continue olmesartan, amlodipine, hydrochlorothiazide, Imdur  #Chronic diastolic heart failure #Severe LVH No LVOT obstruction on echo NYHA class II today.  Warm and dry on exam. Blood pressure control as above  Follow-up with EP on an as-needed basis  Signed, Jocelyn [name] Brothers, MD, [facility], [facility] 09/28/2022 10:50 AM    Electrophysiology  [facility]

## 2022-09-28 NOTE — Patient Instructions (Signed)
Medication Instructions:  Your physician recommends that you continue on your current medications as directed. Please refer to the Current Medication list given to you today.  *If you need a refill on your cardiac medications before your next appointment, please call your pharmacy*  Follow-Up: At [facility], you and your health needs are our priority.  As part of our continuing mission to provide you with exceptional heart care, we have created designated Provider Care Teams.  These Care Teams include your primary Cardiologist (physician) and Advanced Practice Providers (APPs -  Physician Assistants and Nurse Practitioners) who all work together to provide you with the care you need, when you need it.  Your next appointment:   As needed with Dr. Lambert 

## 2022-10-08 ENCOUNTER — Other Ambulatory Visit: Payer: Self-pay | Admitting: Medical

## 2022-10-10 NOTE — Telephone Encounter (Signed)
Prescription refill request for Eliquis received. Indication:cva Last office visit:8/24 Scr:0.73  8/24 Age: 89 Weight:67.6  kg  Prescription refilled

## 2022-11-08 ENCOUNTER — Telehealth (HOSPITAL_COMMUNITY): Payer: Self-pay | Admitting: *Deleted

## 2022-11-08 NOTE — Telephone Encounter (Signed)
Reaching out to patient to offer assistance regarding upcoming cardiac imaging study; pt's daughter answered phone and verbalizes understanding of appt date/time, parking situation and where to check in; name and call back number provided for further questions should they arise  Larey Brick RN Navigator Cardiac Imaging [facility] [phone] office [phone] cell  Patient's daughter is unsure if the patient is claustrophobic but she denies that her mother has any metal.

## 2022-11-09 ENCOUNTER — Other Ambulatory Visit: Payer: Self-pay | Admitting: Medical

## 2022-11-09 ENCOUNTER — Ambulatory Visit
Admission: RE | Admit: 2022-11-09 | Discharge: 2022-11-09 | Disposition: A | Discharge status: Home / Self Care | Payer: 59 | Source: Ambulatory Visit | Attending: Medical | Admitting: Medical

## 2022-11-09 DIAGNOSIS — I429 Cardiomyopathy, unspecified: Secondary | ICD-10-CM

## 2022-11-09 DIAGNOSIS — I517 Cardiomegaly: Secondary | ICD-10-CM | POA: Diagnosis not present

## 2022-11-09 MED ORDER — GADOBUTROL 1 MMOL/ML IV SOLN
9.0000 mL | Freq: Once | INTRAVENOUS | Status: AC | PRN
  Administered 2022-11-09: 9 mL via INTRAVENOUS

## 2022-11-16 ENCOUNTER — Other Ambulatory Visit: Payer: Self-pay

## 2022-11-16 DIAGNOSIS — E859 Amyloidosis, unspecified: Secondary | ICD-10-CM

## 2022-12-06 ENCOUNTER — Encounter: Payer: Self-pay | Admitting: Student in an Organized Health Care Education/Training Program

## 2022-12-06 ENCOUNTER — Ambulatory Visit
Admission: RE | Admit: 2022-12-06 | Discharge: 2022-12-06 | Disposition: A | Discharge status: Home / Self Care | Payer: 59 | Source: Ambulatory Visit | Attending: Student in an Organized Health Care Education/Training Program | Admitting: Student in an Organized Health Care Education/Training Program

## 2022-12-06 ENCOUNTER — Ambulatory Visit: Payer: 59 | Admitting: Student in an Organized Health Care Education/Training Program

## 2022-12-06 VITALS — BP 153/63 | HR 69 | Temp 98.4°F | Resp 16 | Ht 60.0 in | Wt 150.0 lb

## 2022-12-06 DIAGNOSIS — M47816 Spondylosis without myelopathy or radiculopathy, lumbar region: Secondary | ICD-10-CM | POA: Insufficient documentation

## 2022-12-06 DIAGNOSIS — M170 Bilateral primary osteoarthritis of knee: Secondary | ICD-10-CM | POA: Insufficient documentation

## 2022-12-06 DIAGNOSIS — G894 Chronic pain syndrome: Secondary | ICD-10-CM | POA: Insufficient documentation

## 2022-12-06 DIAGNOSIS — M1711 Unilateral primary osteoarthritis, right knee: Secondary | ICD-10-CM | POA: Insufficient documentation

## 2022-12-06 DIAGNOSIS — M461 Sacroiliitis, not elsewhere classified: Secondary | ICD-10-CM

## 2022-12-06 DIAGNOSIS — M5136 Other intervertebral disc degeneration, lumbar region with discogenic back pain only: Secondary | ICD-10-CM | POA: Insufficient documentation

## 2022-12-06 MED ORDER — HYDROCODONE-ACETAMINOPHEN 5-325 MG PO TABS: 1.0000 | ORAL_TABLET | Freq: Two times a day (BID) | ORAL | 0 refills | Status: DC | PRN

## 2022-12-06 NOTE — Progress Notes (Signed)
PROVIDER NOTE: Information contained herein reflects review and annotations entered in association with encounter. Interpretation of such information and data should be left to medically-trained personnel. Information provided to patient can be located elsewhere in the medical record under "Patient Instructions". Document created using STT-dictation technology, any transcriptional errors that may result from process are unintentional.    Patient: Jocelyn [name]  Service Category: E/M  Provider: Edward Jolly, MD  DOB: [birth year]  DOS: 12/06/2022  Specialty: Interventional Pain Management  MRN: [id number]  Setting: Ambulatory outpatient  PCP: Gorden Harms, PA-C  Type: Established Patient    Referring Provider: Gorden Harms, PA-C  Location: Office  Delivery: Face-to-face     HPI  Jocelyn [name], a 89 y.o. year old female, is here today because of her Bilateral primary osteoarthritis of knee [M17.0]. Jocelyn [name] primary complain today is Back Pain  Last encounter: My last encounter with her was on 09/27/22  Pertinent problems: Jocelyn [name] has Lumbar degenerative disc disease; Hearing loss; Cerebrovascular accident (CVA) (HCC); Controlled substance agreement signed; Bilateral primary osteoarthritis of knee; Carpal tunnel syndrome on right; SI joint arthritis (HCC); Chronic pain of both knees; and Chronic pain syndrome on their pertinent problem list. Pain Assessment: Severity of Chronic pain is reported as a 7 /10. Location: Back Right, Left, Lower/down legs bilateral to ankles. Onset: More than a month ago. Quality: Aching, Constant, Crushing, Pins and needles, Radiating, Restless. Timing: Constant. Modifying factor(s): procedures,medications. Vitals:  height is 5' (1.524 m) and weight is 150 lb (68 kg). Her temperature is 98.4 F (36.9 C). Her blood pressure is 153/63 (abnormal) and her pulse is 69. Her respiration is 16 and oxygen saturation is 97%.   Reason for encounter: medication  management.    No change in medical history since last visit.  Patient's pain is at baseline other than increased pain in bilateral knees.  She states that her previous intra-articular knee steroid injection was not as helpful.  We discussed gel injections as well as genicular nerve block.  She is requesting a repeat of x-rays before deciding which 1 to proceed with..  Patient continues multimodal pain regimen as prescribed.  States that it provides pain relief and improvement in functional status.   Pharmacotherapy Assessment  Analgesic: Hydrocodone 5 mg BID prn #60/month   Monitoring: [place] PMP: PDMP reviewed during this encounter.       Pharmacotherapy: No side-effects or adverse reactions reported. Compliance: No problems identified. Effectiveness: Clinically acceptable.  UDS:  Summary  Date Value Ref Range Status  04/21/2022 Note  Final    Comment:    ==================================================================== [employer] [facility] (MW) ==================================================================== Test                             Result       Flag       Units  Drug Present and Declared for Prescription Verification   Hydrocodone                    664          EXPECTED   ng/mg creat   Hydromorphone                  258          EXPECTED   ng/mg creat   Dihydrocodeine                 59  EXPECTED   ng/mg creat   Norhydrocodone                 442          EXPECTED   ng/mg creat    Sources of hydrocodone include scheduled prescription medications.    Hydromorphone, dihydrocodeine and norhydrocodone are expected    metabolites of hydrocodone. Hydromorphone and dihydrocodeine are    also available as scheduled prescription medications.  ==================================================================== Test                      Result    Flag   Units      Ref Range   Creatinine              104              mg/dL       >=16 ==================================================================== Declared Medications:  The flagging and interpretation on this report are based on the  following declared medications.  Unexpected results may arise from  inaccuracies in the declared medications.   **Note: The testing scope of this panel includes these medications:   Hydrocodone (Norco)   **Note: The testing scope of this panel does not include the  following reported medications:   Acetaminophen (Norco)  Albuterol  Alendronate (Fosamax)  Amlodipine  Aspirin  Atorvastatin  Calcium  Diclofenac (Voltaren)  Iron  Isosorbide (Imdur)  Olmesartan  Omeprazole  Vitamin D ==================================================================== For clinical consultation, please call [phone]. ====================================================================       ROS  Constitutional: Denies any fever or chills Gastrointestinal: No reported hemesis, hematochezia, vomiting, or acute GI distress Musculoskeletal:  Low back, bilateral knee, bilateral foot pain Neurological: No reported episodes of acute onset apraxia, aphasia, dysarthria, agnosia, amnesia, paralysis, loss of coordination, or loss of consciousness  Medication Review  HYDROcodone-acetaminophen, Olmesartan-amLODIPine-HCTZ, albuterol, alendronate, apixaban, calcium citrate-vitamin D, clotrimazole, ferrous sulfate, isosorbide mononitrate, and omeprazole  History Review  Allergy: Jocelyn [name] has No Known Allergies. Drug: Jocelyn [name]  reports no history of drug use. Alcohol:  reports no history of alcohol use. Tobacco:  reports that she has never smoked. She has never used smokeless tobacco. Social: Jocelyn [name]  reports that she has never smoked. She has never used smokeless tobacco. She reports that she does not drink alcohol and does not use drugs. Medical:  has a past medical history of Arthritis of both knees (08/01/2015), Colitis, Essential  hypertension, benign (07/28/2014), GERD (gastroesophageal reflux disease), iron deficiency anemia (09/02/2015), Hyperlipidemia, Hypertension, Lumbar pain, Medicare annual wellness visit, subsequent (04/07/2017), OSA on CPAP (08/01/2015), Overweight (BMI 25.0-29.9) (07/10/2013), Prediabetes (09/02/2015), and SOB (shortness of breath) (07/10/2013). Surgical: Jocelyn [name]  has a past surgical history that includes Cataract extraction; Abdominal hysterectomy; Cholecystectomy; Lumbar disc surgery; and Colonoscopy with propofol (N/A, 04/09/2018). Family: family history includes Anuerysm in her son; Cancer in her daughter and father; Diabetes in her daughter and mother; Heart disease in her mother; Hypertension in her daughter.  Laboratory Chemistry Profile   Renal Lab Results  Component Value Date   BUN 17 09/05/2022   CREATININE 0.73 09/05/2022   BCR NOT APPLICABLE 08/01/2018   GFRAA 78 08/01/2018   GFRNONAA >60 09/05/2022     Hepatic Lab Results  Component Value Date   AST 31 09/05/2022   ALT 40 09/05/2022   ALBUMIN 3.8 09/05/2022   ALKPHOS 40 09/05/2022     Electrolytes Lab Results  Component Value Date   NA 138 09/05/2022   K 3.7  09/05/2022   CL 107 09/05/2022   CALCIUM 9.4 09/05/2022   MG 1.9 08/09/2022     Bone No results found for: "VD25OH", "VD125OH2TOT", "WJ1914NW2", "NF6213YQ6", "25OHVITD1", "25OHVITD2", "25OHVITD3", "TESTOFREE", "TESTOSTERONE"   Inflammation (CRP: Acute Phase) (ESR: Chronic Phase) No results found for: "CRP", "ESRSEDRATE", "LATICACIDVEN"     Note: Above Lab results reviewed.  Recent Imaging Review  MR CARDIAC VELOCITY FLOW MAP CLINICAL DATA:  LVH  EXAM: CARDIAC MRI  TECHNIQUE: The patient was scanned on a 1.5 Tesla Siemens magnet. A dedicated cardiac coil was used. Functional imaging was done using Fiesta sequences. 2,3, and 4 chamber views were done to assess for RWMA's. Modified Simpson's rule using a short axis stack was used to calculate an ejection  fraction on a dedicated work [employer]. The patient received 9 cc of Gadavist. After 10 minutes inversion recovery sequences were used to assess for infiltration and scar tissue. Velocity flow mapping performed in the ascending aorta and main pulmonary artery.  CONTRAST:  9 cc  of Gadavist  FINDINGS: 1. Normal left ventricular size, severe asymmetric basal septal thickness, measures 1.6cm, normal systolic function (LVEF = 61%). There are no regional wall motion abnormalities.  There is diffuse subendocardial late gadolinium enhancement in the basal-mid segments of the left ventricular myocardium.  There is no LVOT obstruction.  LVEDV: 130 ml  LVESV: 51 ml  SV: 79 ml  CO: 4.8  L/min  Myocardial mass: 173 g  2. Normal right ventricular size, thickness and systolic function (RVEF = 53%). There are no regional wall motion abnormalities.  3. Moderately dilated left atrial size, mildly dilated right atrial size.  4. Normal size of the aortic root, ascending aorta and pulmonary artery.  5.  Mild aortic regurgitation significant valvular abnormalities.  6.  Normal pericardium.  No pericardial effusion.  IMPRESSION: 1. Normal LV systolic function.  LVEF 61%.  2. Severe asymmetric basal septal hypertrophy, 1.6 cm.  3. Diffuse subendocardial LGE/scar in the basal-mid segments (apical sparing).  4. There is no LVOT obstruction.  5. Scar/LGE distribution suggests infiltrative process such as amyloidosis.  Electronically Signed   By: Debbe Odea M.D.   On: 11/09/2022 19:32 MR CARDIAC VELOCITY FLOW MAP CLINICAL DATA:  LVH  EXAM: CARDIAC MRI  TECHNIQUE: The patient was scanned on a 1.5 Tesla Siemens magnet. A dedicated cardiac coil was used. Functional imaging was done using Fiesta sequences. 2,3, and 4 chamber views were done to assess for RWMA's. Modified Simpson's rule using a short axis stack was used to calculate an ejection fraction  on a dedicated work [employer]. The patient received 9 cc of Gadavist. After 10 minutes inversion recovery sequences were used to assess for infiltration and scar tissue. Velocity flow mapping performed in the ascending aorta and main pulmonary artery.  CONTRAST:  9 cc  of Gadavist  FINDINGS: 1. Normal left ventricular size, severe asymmetric basal septal thickness, measures 1.6cm, normal systolic function (LVEF = 61%). There are no regional wall motion abnormalities.  There is diffuse subendocardial late gadolinium enhancement in the basal-mid segments of the left ventricular myocardium.  There is no LVOT obstruction.  LVEDV: 130 ml  LVESV: 51 ml  SV: 79 ml  CO: 4.8  L/min  Myocardial mass: 173 g  2. Normal right ventricular size, thickness and systolic function (RVEF = 53%). There are no regional wall motion abnormalities.  3. Moderately dilated left atrial size, mildly dilated right atrial size.  4. Normal size of  the aortic root, ascending aorta and pulmonary artery.  5.  Mild aortic regurgitation significant valvular abnormalities.  6.  Normal pericardium.  No pericardial effusion.  IMPRESSION: 1. Normal LV systolic function.  LVEF 61%.  2. Severe asymmetric basal septal hypertrophy, 1.6 cm.  3. Diffuse subendocardial LGE/scar in the basal-mid segments (apical sparing).  4. There is no LVOT obstruction.  5. Scar/LGE distribution suggests infiltrative process such as amyloidosis.  Electronically Signed   By: Debbe Odea M.D.   On: 11/09/2022 19:32 MR CARDIAC MORPHOLOGY W WO CONTRAST CLINICAL DATA:  LVH  EXAM: CARDIAC MRI  TECHNIQUE: The patient was scanned on a 1.5 Tesla Siemens magnet. A dedicated cardiac coil was used. Functional imaging was done using Fiesta sequences. 2,3, and 4 chamber views were done to assess for RWMA's. Modified Simpson's rule using a short axis stack was used to calculate an ejection fraction  on a dedicated work [employer]. The patient received 9 cc of Gadavist. After 10 minutes inversion recovery sequences were used to assess for infiltration and scar tissue. Velocity flow mapping performed in the ascending aorta and main pulmonary artery.  CONTRAST:  9 cc  of Gadavist  FINDINGS: 1. Normal left ventricular size, severe asymmetric basal septal thickness, measures 1.6cm, normal systolic function (LVEF = 61%). There are no regional wall motion abnormalities.  There is diffuse subendocardial late gadolinium enhancement in the basal-mid segments of the left ventricular myocardium.  There is no LVOT obstruction.  LVEDV: 130 ml  LVESV: 51 ml  SV: 79 ml  CO: 4.8  L/min  Myocardial mass: 173 g  2. Normal right ventricular size, thickness and systolic function (RVEF = 53%). There are no regional wall motion abnormalities.  3. Moderately dilated left atrial size, mildly dilated right atrial size.  4. Normal size of the aortic root, ascending aorta and pulmonary artery.  5.  Mild aortic regurgitation significant valvular abnormalities.  6.  Normal pericardium.  No pericardial effusion.  IMPRESSION: 1. Normal LV systolic function.  LVEF 61%.  2. Severe asymmetric basal septal hypertrophy, 1.6 cm.  3. Diffuse subendocardial LGE/scar in the basal-mid segments (apical sparing).  4. There is no LVOT obstruction.  5. Scar/LGE distribution suggests infiltrative process such as amyloidosis.  Electronically Signed   By: Debbe Odea M.D.   On: 11/09/2022 19:32  Note: Reviewed        Physical Exam  General appearance: Well nourished, well developed, and well hydrated. In no apparent acute distress Mental status: Alert, oriented x 3 (person, place, & time)       Respiratory: No evidence of acute respiratory distress Eyes: PERLA Vitals: BP (!) 153/63   Pulse 69   Temp 98.4 F (36.9 C)   Resp 16   Ht 5' (1.524 m)   Wt 150 lb (68 kg)    SpO2 97%   BMI 29.29 kg/m  BMI: Estimated body mass index is 29.29 kg/m as calculated from the following:   Height as of this encounter: 5' (1.524 m).   Weight as of this encounter: 150 lb (68 kg). Ideal: Ideal body weight: 45.5 kg (100 lb 4.9 oz) Adjusted ideal body weight: 54.5 kg (120 lb 3 oz)  Patient presents today in wheelchair.  Low back pain, bilateral knee pain.  Assessment   Diagnosis  1. Bilateral primary osteoarthritis of knee   2. Degeneration of intervertebral disc of lumbar region with discogenic back pain   3. SI joint arthritis (HCC)   4. Primary  osteoarthritis of right knee   5. Lumbar facet arthropathy   6. Chronic pain syndrome       Plan of Care  Jocelyn [name] has a current medication list which includes the following long-term medication(s): eliquis, calcium citrate-vitamin d, ferrous sulfate, isosorbide mononitrate, and olmesartan-amlodipine-hctz.  Pharmacotherapy (Medications Ordered): Meds ordered this encounter  Medications   HYDROcodone-acetaminophen (NORCO/VICODIN) 5-325 MG tablet    Sig: Take 1 tablet by mouth every 12 (twelve) hours as needed for severe pain (pain score 7-10). Must last 30 days.    Dispense:  60 tablet    Refill:  0    Chronic Pain: STOP Act (Not applicable) Fill 1 day early if closed on refill date. Avoid benzodiazepines within 8 hours of opioids   HYDROcodone-acetaminophen (NORCO/VICODIN) 5-325 MG tablet    Sig: Take 1 tablet by mouth every 12 (twelve) hours as needed for severe pain (pain score 7-10). Must last 30 days.    Dispense:  60 tablet    Refill:  0    Chronic Pain: STOP Act (Not applicable) Fill 1 day early if closed on refill date. Avoid benzodiazepines within 8 hours of opioids   Orders Placed This Encounter  Procedures   DG Knee Complete 4 Views Right    Standing Status:   Future    Number of Occurrences:   1    Standing Expiration Date:   01/05/2023    Scheduling Instructions:     Please make sure  that the patient understands that this needs to be done as soon as possible. Never have the patient do the imaging "just before the next appointment". Inform patient that having the imaging done within the [facility] will expedite the availability of the results and will provide      imaging availability to the requesting physician. In addition inform the patient that the imaging order has an expiration date and will not be renewed if not done within the active period.    Order Specific Question:   Reason for Exam (SYMPTOM  OR DIAGNOSIS REQUIRED)    Answer:   Right knee pain/arthralgia    Order Specific Question:   Preferred imaging location?    Answer:   [facility]    Order Specific Question:   Release to patient    Answer:   Immediate    Order Specific Question:   Call Results- Best Contact Number?    Answer:   [phone] [facility])   DG Knee Complete 4 Views Left    Standing Status:   Future    Number of Occurrences:   1    Standing Expiration Date:   01/05/2023    Scheduling Instructions:     Please make sure that the patient understands that this needs to be done as soon as possible. Never have the patient do the imaging "just before the next appointment". Inform patient that having the imaging done within the [facility] will expedite the availability of the results and will provide      imaging availability to the requesting physician. In addition inform the patient that the imaging order has an expiration date and will not be renewed if not done within the active period.    Order Specific Question:   Reason for Exam (SYMPTOM  OR DIAGNOSIS REQUIRED)    Answer:   Left knee pain/arthralgia    Order Specific Question:   Preferred imaging location?    Answer:   [facility]  Order Specific Question:   Release to patient    Answer:   Immediate    Order Specific Question:   Call Results- Best Contact Number?    Answer:   [phone] [facility])       Follow-up plan:   Return for patient will call to schedule F2F appt prn when out of meds.   Recent Visits Date Type Provider Dept  09/27/22 Office Visit Edward Jolly, MD [facility]  Showing recent visits within past 90 days and meeting all other requirements Today's Visits Date Type Provider Dept  12/06/22 Office Visit Edward Jolly, MD [facility]  Showing today's visits and meeting all other requirements Future Appointments No visits were found meeting these conditions. Showing future appointments within next 90 days and meeting all other requirements  I discussed the assessment and treatment plan with the patient. The patient was provided an opportunity to ask questions and all were answered. The patient agreed with the plan and demonstrated an understanding of the instructions.  Patient advised to call back or seek an in-person evaluation if the symptoms or condition worsens.  Duration of encounter: .  Note by: Edward Jolly, MD Date: 12/06/2022; Time: 1:08 PM

## 2022-12-06 NOTE — Progress Notes (Signed)
Nursing Pain Medication Assessment:  Safety precautions to be maintained throughout the outpatient stay will include: orient to surroundings, keep bed in low position, maintain call bell within reach at all times, provide assistance with transfer out of bed and ambulation.  Medication Inspection Compliance: Pill count conducted under aseptic conditions, in front of the patient. Neither the pills nor the bottle was removed from the patient's sight at any time. Once count was completed pills were immediately returned to the patient in their original bottle.  Medication: Hydrocodone/APAP Pill/Patch Count:  0 of 60 pills remain Pill/Patch Appearance: Markings consistent with prescribed medication Bottle Appearance: Standard pharmacy container. Clearly labeled. Filled Date: 8 / 27 / 2024 Last Medication intake:  Yesterday

## 2022-12-06 NOTE — Patient Instructions (Signed)

## 2022-12-07 ENCOUNTER — Ambulatory Visit: Payer: 59 | Attending: Internal Medicine | Admitting: Internal Medicine

## 2022-12-07 VITALS — BP 175/83 | HR 74 | Ht 60.0 in | Wt 149.0 lb

## 2022-12-07 DIAGNOSIS — I10 Essential (primary) hypertension: Secondary | ICD-10-CM | POA: Diagnosis not present

## 2022-12-07 DIAGNOSIS — I5032 Chronic diastolic (congestive) heart failure: Secondary | ICD-10-CM | POA: Diagnosis not present

## 2022-12-07 DIAGNOSIS — I4891 Unspecified atrial fibrillation: Secondary | ICD-10-CM | POA: Diagnosis not present

## 2022-12-07 DIAGNOSIS — R600 Localized edema: Secondary | ICD-10-CM

## 2022-12-07 DIAGNOSIS — I517 Cardiomegaly: Secondary | ICD-10-CM

## 2022-12-07 MED ORDER — SPIRONOLACTONE 25 MG PO TABS: 12.5000 mg | ORAL_TABLET | Freq: Every day | ORAL | 3 refills | Status: DC

## 2022-12-07 MED ORDER — FUROSEMIDE 40 MG PO TABS: 40.0000 mg | ORAL_TABLET | Freq: Every day | ORAL | 3 refills | Status: DC

## 2022-12-07 NOTE — Addendum Note (Signed)
Addended by: Electa Sniff on: 12/07/2022 12:36 PM   Modules accepted: Orders

## 2022-12-07 NOTE — Progress Notes (Addendum)
ADVANCED HF CLINIC CONSULT NOTE  Referring Physician: Gorden Harms, PA-C Primary Care: Gorden Harms, PA-C Primary Cardiologist: Yvonne Kendall, MD  HPI:  Jocelyn [name] is an 89 y/o woman with permanent AF, diastolic HF, DM2, HTN and OSA on CPAP referred by Dr. Okey Dupre for further evaluation of her HF.   She had an echo in 8/24 EF 65% with severe LVH and severe BAE. Mild MR/TR. RVSP 48  Based on echo there was concern for possible cardiac amyloidosis so cMRI obtained.   cMRI 11/09/22 LVEF 61% with severe basilar septal hypertrophy (1.6cm) No LVOT obstruction /+Diffuse subendocardial LGE in basal-mid segments of LV. RV 53%  Here with her daughter, Jocelyn Dandy. Says she feels ok. Gets around with a walker but still drives and goes to the store and church. Gets SOB easily. -   Past Medical History:  Diagnosis Date   Arthritis of both knees 08/01/2015   Colitis    Essential hypertension, benign 07/28/2014   GERD (gastroesophageal reflux disease)    Hx of iron deficiency anemia 09/02/2015   Hyperlipidemia    Hypertension    Lumbar pain    Medicare annual wellness visit, subsequent 04/07/2017   OSA on CPAP 08/01/2015   Overweight (BMI 25.0-29.9) 07/10/2013   Prediabetes 09/02/2015   SOB (shortness of breath) 07/10/2013    Current Outpatient Medications  Medication Sig Dispense Refill   albuterol (VENTOLIN HFA) 108 (90 Base) MCG/ACT inhaler Inhale 1 puff into the lungs as needed.     alendronate (FOSAMAX) 70 MG tablet Take 70 mg by mouth once a week.     apixaban (ELIQUIS) 5 MG TABS tablet TAKE 1 TABLET BY MOUTH TWICE A DAY 60 tablet 5   calcium citrate-vitamin D 500-400 MG-UNIT chewable tablet Chew 1 tablet by mouth 2 (two) times daily.      clotrimazole (LOTRIMIN) 1 % cream Apply 1 Application topically 2 (two) times daily.     ferrous sulfate 325 (65 FE) MG tablet Take 1 tablet (325 mg total) by mouth daily. 30 tablet 11   HYDROcodone-acetaminophen (NORCO/VICODIN) 5-325 MG tablet Take 1 tablet  by mouth every 12 (twelve) hours as needed for severe pain (pain score 7-10). Must last 30 days. 60 tablet 0   [START ON 01/05/2023] HYDROcodone-acetaminophen (NORCO/VICODIN) 5-325 MG tablet Take 1 tablet by mouth every 12 (twelve) hours as needed for severe pain (pain score 7-10). Must last 30 days. 60 tablet 0   isosorbide mononitrate (IMDUR) 120 MG 24 hr tablet TAKE 1 TABLET BY MOUTH EVERY DAY 90 tablet 0   Olmesartan-amLODIPine-HCTZ 40-10-12.5 MG TABS Take 1 tablet by mouth daily. For blood pressure 90 tablet 1   omeprazole (PRILOSEC) 20 MG capsule Take 20 mg by mouth daily.     No current facility-administered medications for this visit.    No Known Allergies    Social History   Socioeconomic History   Marital status: Widowed    Spouse name: Not on file   Number of children: 5   Years of education: Not on file   Highest education level: Not on file  Occupational History   Not on file  Tobacco Use   Smoking status: Never   Smokeless tobacco: Never  Vaping Use   Vaping status: Never Used  Substance and Sexual Activity   Alcohol use: No    Alcohol/week: 0.0 standard drinks of alcohol   Drug use: No   Sexual activity: Not Currently  Other Topics Concern   Not on file  Social  History Narrative   Not on file   Social Determinants of Health   Financial Resource Strain: Medium Risk (07/26/2018)   Overall Financial Resource Strain (CARDIA)    Difficulty of Paying Living Expenses: Somewhat hard  Food Insecurity: Food Insecurity Present (07/26/2018)   Hunger Vital Sign    Worried About Running Out of Food in the Last Year: Sometimes true    Ran Out of Food in the Last Year: Never true  Transportation Needs: No Transportation Needs (07/26/2018)   PRAPARE - Administrator, Civil Service (Medical): No    Lack of Transportation (Non-Medical): No  Physical Activity: Inactive (07/26/2018)   Exercise Vital Sign    Days of Exercise per Week: 0 days    Minutes of Exercise  per Session: 0 min  Stress: No Stress Concern Present (07/26/2018)   [employer] of Occupational Health - Occupational Stress Questionnaire    Feeling of Stress : Not at all  Social Connections: Moderately Isolated (07/26/2018)   Social Connection and Isolation Panel [NHANES]    Frequency of Communication with Friends and Family: More than three times a week    Frequency of Social Gatherings with Friends and Family: Three times a week    Attends Religious Services: More than 4 times per year    Active Member of Clubs or Organizations: No    Attends Banker Meetings: Never    Marital Status: Widowed  Intimate Partner Violence: Not At Risk (07/26/2018)   Humiliation, Afraid, Rape, and Kick questionnaire    Fear of Current or Ex-Partner: No    Emotionally Abused: No    Physically Abused: No    Sexually Abused: No      Family History  Problem Relation Age of Onset   Diabetes Mother    Heart disease Mother    Cancer Father        lung; smoker   Hypertension Daughter    Diabetes Daughter    Cancer Daughter        breast   Anuerysm Son    Breast cancer Neg Hx     Vitals:   12/07/22 0925 12/07/22 0930  BP: (!) 167/76 (!) 175/83  Pulse: 83 74  SpO2: 95% 97%  Weight: 149 lb (67.6 kg)   Height: 5' (1.524 m)     PHYSICAL EXAM: General:  Elderly Sitting in WC No respiratory difficulty HEENT: normal Neck: supple. JVP to jaw. Carotids 2+ bilat; no bruits. No lymphadenopathy or thryomegaly appreciated. Cor: PMI nondisplaced. Regular rate & rhythm. No rubs, gallops or murmurs. Lungs: clear Abdomen: soft, nontender, nondistended. No hepatosplenomegaly. No bruits or masses. Good bowel sounds. Extremities: no cyanosis, clubbing, rash, 2-3+ edema Neuro: alert & oriented x 3, cranial nerves grossly intact. moves all 4 extremities w/o difficulty. Affect pleasant.  ECG: AF 81 + LVH Narrow QRS Personally reviewed   ASSESSMENT & PLAN:  1. Chronic diastolic HF with  potential infiltrative CM - Echo 8/24 EF 65% with severe LVH and severe BAE. Mild MR/TR. RVSP 48 - cMRI 11/09/22 LVEF 61% with severe basilar septal hypertrophy (1.6cm) No LVOT obstruction /+Diffuse subendocardial LGE in basal-mid segments of LV. RV 53% - Suspect hypertensive CM + TTR cardiac amyloid but need to exclude AL amyloid and other infiltrative processes. No other findings to suggest sarcoid (awaiting ECV from MRI read) - Will check PYP scan, SPEP and serum light chains  If PYP + can get genetics testing - NYHA II-III - Volume status elevated. Start  lasix 40 daily and spiro 12.5. Check BMET today and 2 weeks - Place compression hose - Continue ARB - See back in 4 weeks  2. Chronic AF - rate controlled on Eliquis  - no bleeding  3. HTN - BP up - med changes as above  4. OSA - continue CPAP  Jocelyn Meres, MD  9:47 AM

## 2022-12-07 NOTE — Patient Instructions (Addendum)
Medication Changes:  Please Start taking Lasix 40 mg (1 tablet) daily.  Take Spironolactone 12.5 mg (0.5 tablet) daily.  Lab Work:  Labs done today, your results will be available in MyChart, we will contact you for abnormal readings.   Testing/Procedures:  You have been ordered a PYP Scan.  This is done in the Radiology Department of [facility].  When you come for this test please plan to be there 2-3 hours.  Call this number to reschedule [phone]  Please wear your compression hose daily, place them on as soon as you get up in the morning and remove before you go to bed at night.     Special Instructions // Education:  Do the following things EVERYDAY: Weigh yourself in the morning before breakfast. Write it down and keep it in a log. Take your medicines as prescribed Eat low salt foods--Limit salt (sodium) to 2000 mg per day.  Stay as active as you can everyday Limit all fluids for the day to less than 2 liters   Follow-Up in: 4 week follow up    If you have any questions or concerns before your next appointment please send [place] a message through mychart or call our office at [phone] Monday-Friday 8 am-5 pm.   If you have an urgent need after hours on the weekend please call your Primary Cardiologist or the [facility] in [place] at [phone].   At the [facility], you and your health needs are our priority. We have a designated team specialized in the treatment of Heart Failure. This Care Team includes your primary Heart Failure Specialized Cardiologist (physician), Advanced Practice Providers (APPs- Physician Assistants and Nurse Practitioners), and Pharmacist who all work together to provide you with the care you need, when you need it.   You may see any of the following providers on your designated Care Team at your next follow up:  Dr. Arvilla Meres Dr. Marca Ancona Dr. Dorthula Nettles Dr. Theresia Bough Tonye Becket, NP Robbie Lis, [place] [address] [place], [place] Brynda Peon, NP [place] Lee, NP Clarisa Kindred, NP Enos Fling, PharmD

## 2022-12-08 ENCOUNTER — Telehealth: Payer: Self-pay | Admitting: *Deleted

## 2022-12-12 LAB — MULTIPLE MYELOMA CASCADE WITH REFLEX TO SIFE AND SFLC

## 2022-12-12 LAB — MULTIPLE MYELOMA PANEL, SERUM
Albumin SerPl Elph-Mcnc: 4.1 g/dL (ref 2.9–4.4)
Albumin/Glob SerPl: 1.2 (ref 0.7–1.7)
Alpha 1: 0.2 g/dL (ref 0.0–0.4)
Alpha2 Glob SerPl Elph-Mcnc: 0.7 g/dL (ref 0.4–1.0)
B-Globulin SerPl Elph-Mcnc: 0.9 g/dL (ref 0.7–1.3)
Gamma Glob SerPl Elph-Mcnc: 1.7 g/dL (ref 0.4–1.8)
Globulin, Total: 3.5 g/dL (ref 2.2–3.9)
IgA/Immunoglobulin A, Serum: 283 mg/dL (ref 64–422)
IgG (Immunoglobin G), Serum: 1717 mg/dL — ABNORMAL HIGH (ref 586–1602)
IgM (Immunoglobulin M), Srm: 100 mg/dL (ref 26–217)

## 2022-12-12 LAB — BRAIN NATRIURETIC PEPTIDE: BNP: 323.1 pg/mL — ABNORMAL HIGH (ref 0.0–100.0)

## 2022-12-12 LAB — COMPREHENSIVE METABOLIC PANEL WITH GFR
ALT: 26 [IU]/L (ref 0–32)
AST: 33 [IU]/L (ref 0–40)
Albumin: 4.4 g/dL (ref 3.7–4.7)
Alkaline Phosphatase: 49 [IU]/L (ref 44–121)
BUN/Creatinine Ratio: 21 (ref 12–28)
BUN: 16 mg/dL (ref 8–27)
Bilirubin Total: 0.8 mg/dL (ref 0.0–1.2)
CO2: 26 mmol/L (ref 20–29)
Calcium: 10 mg/dL (ref 8.7–10.3)
Chloride: 105 mmol/L (ref 96–106)
Creatinine, Ser: 0.77 mg/dL (ref 0.57–1.00)
Globulin, Total: 3.2 g/dL (ref 1.5–4.5)
Glucose: 91 mg/dL (ref 70–99)
Potassium: 4.2 mmol/L (ref 3.5–5.2)
Sodium: 143 mmol/L (ref 134–144)
Total Protein: 7.6 g/dL (ref 6.0–8.5)
eGFR: 74 mL/min/{1.73_m2}

## 2022-12-26 ENCOUNTER — Telehealth: Payer: Self-pay | Admitting: *Deleted

## 2022-12-26 NOTE — Telephone Encounter (Signed)
-----   Message from Edward Jolly sent at 12/26/2022  9:32 AM EST ----- Please call pt with xray results. If she is interested in knee steroid injection, I can place order ----- Message ----- From: Interface, Rad Results In Sent: 12/26/2022   3:50 AM EST To: Edward Jolly, MD

## 2022-12-26 NOTE — Telephone Encounter (Signed)
Attempted to call patient, message left.

## 2022-12-27 ENCOUNTER — Other Ambulatory Visit: Payer: Self-pay | Admitting: Student in an Organized Health Care Education/Training Program

## 2022-12-27 ENCOUNTER — Telehealth (HOSPITAL_COMMUNITY): Payer: Self-pay | Admitting: *Deleted

## 2022-12-27 ENCOUNTER — Telehealth: Payer: Self-pay | Admitting: *Deleted

## 2022-12-27 DIAGNOSIS — M170 Bilateral primary osteoarthritis of knee: Secondary | ICD-10-CM

## 2022-12-27 NOTE — Telephone Encounter (Signed)
Reminded patient of upcoming Amyloid.  Ricky Ala

## 2022-12-27 NOTE — Telephone Encounter (Signed)
Attempted to call , second attempt, to discuss x-ray results. No answer, left message instructing patient to call back.

## 2022-12-27 NOTE — Telephone Encounter (Signed)
-----   Message from Edward Jolly sent at 12/26/2022  9:32 AM EST ----- Please call pt with xray results. If she is interested in knee steroid injection, I can place order ----- Message ----- From: Interface, Rad Results In Sent: 12/26/2022   3:50 AM EST To: Edward Jolly, MD

## 2022-12-29 ENCOUNTER — Emergency Department (HOSPITAL_COMMUNITY): Payer: 59 | Admitting: Registered Nurse

## 2022-12-29 ENCOUNTER — Encounter (HOSPITAL_COMMUNITY): Payer: Self-pay

## 2022-12-29 ENCOUNTER — Inpatient Hospital Stay (HOSPITAL_COMMUNITY): Payer: 59

## 2022-12-29 ENCOUNTER — Encounter (HOSPITAL_COMMUNITY)
Admission: EM | Disposition: A | Discharge status: Skilled Nursing Facility | Payer: Self-pay | Source: Home / Self Care | Attending: Neurology

## 2022-12-29 ENCOUNTER — Inpatient Hospital Stay (HOSPITAL_COMMUNITY)
Admission: EM | Admit: 2022-12-29 | Discharge: 2023-01-11 | DRG: 023 | Disposition: A | Discharge status: Skilled Nursing Facility | Payer: 59 | Attending: Neurology | Admitting: Neurology

## 2022-12-29 ENCOUNTER — Emergency Department (HOSPITAL_COMMUNITY): Payer: 59

## 2022-12-29 DIAGNOSIS — R414 Neurologic neglect syndrome: Secondary | ICD-10-CM | POA: Diagnosis present

## 2022-12-29 DIAGNOSIS — G894 Chronic pain syndrome: Secondary | ICD-10-CM | POA: Diagnosis present

## 2022-12-29 DIAGNOSIS — R29719 NIHSS score 19: Secondary | ICD-10-CM | POA: Diagnosis present

## 2022-12-29 DIAGNOSIS — I63231 Cerebral infarction due to unspecified occlusion or stenosis of right carotid arteries: Secondary | ICD-10-CM

## 2022-12-29 DIAGNOSIS — I6521 Occlusion and stenosis of right carotid artery: Secondary | ICD-10-CM | POA: Diagnosis present

## 2022-12-29 DIAGNOSIS — R270 Ataxia, unspecified: Secondary | ICD-10-CM | POA: Diagnosis present

## 2022-12-29 DIAGNOSIS — I63511 Cerebral infarction due to unspecified occlusion or stenosis of right middle cerebral artery: Secondary | ICD-10-CM | POA: Diagnosis not present

## 2022-12-29 DIAGNOSIS — J690 Pneumonitis due to inhalation of food and vomit: Secondary | ICD-10-CM | POA: Diagnosis not present

## 2022-12-29 DIAGNOSIS — Z7901 Long term (current) use of anticoagulants: Secondary | ICD-10-CM

## 2022-12-29 DIAGNOSIS — K219 Gastro-esophageal reflux disease without esophagitis: Secondary | ICD-10-CM | POA: Diagnosis not present

## 2022-12-29 DIAGNOSIS — R1310 Dysphagia, unspecified: Secondary | ICD-10-CM | POA: Diagnosis present

## 2022-12-29 DIAGNOSIS — H518 Other specified disorders of binocular movement: Secondary | ICD-10-CM | POA: Diagnosis present

## 2022-12-29 DIAGNOSIS — I2510 Atherosclerotic heart disease of native coronary artery without angina pectoris: Secondary | ICD-10-CM | POA: Diagnosis present

## 2022-12-29 DIAGNOSIS — I161 Hypertensive emergency: Secondary | ICD-10-CM | POA: Diagnosis present

## 2022-12-29 DIAGNOSIS — E119 Type 2 diabetes mellitus without complications: Secondary | ICD-10-CM | POA: Diagnosis present

## 2022-12-29 DIAGNOSIS — I639 Cerebral infarction, unspecified: Principal | ICD-10-CM | POA: Diagnosis present

## 2022-12-29 DIAGNOSIS — I5032 Chronic diastolic (congestive) heart failure: Secondary | ICD-10-CM | POA: Diagnosis present

## 2022-12-29 DIAGNOSIS — Z801 Family history of malignant neoplasm of trachea, bronchus and lung: Secondary | ICD-10-CM

## 2022-12-29 DIAGNOSIS — I10 Essential (primary) hypertension: Secondary | ICD-10-CM | POA: Diagnosis not present

## 2022-12-29 DIAGNOSIS — M25562 Pain in left knee: Secondary | ICD-10-CM | POA: Diagnosis present

## 2022-12-29 DIAGNOSIS — I4821 Permanent atrial fibrillation: Secondary | ICD-10-CM | POA: Diagnosis present

## 2022-12-29 DIAGNOSIS — D649 Anemia, unspecified: Secondary | ICD-10-CM | POA: Diagnosis present

## 2022-12-29 DIAGNOSIS — Z5941 Food insecurity: Secondary | ICD-10-CM

## 2022-12-29 DIAGNOSIS — I110 Hypertensive heart disease with heart failure: Secondary | ICD-10-CM | POA: Diagnosis present

## 2022-12-29 DIAGNOSIS — Z5986 Financial insecurity: Secondary | ICD-10-CM

## 2022-12-29 DIAGNOSIS — Z7983 Long term (current) use of bisphosphonates: Secondary | ICD-10-CM

## 2022-12-29 DIAGNOSIS — G8194 Hemiplegia, unspecified affecting left nondominant side: Secondary | ICD-10-CM | POA: Diagnosis present

## 2022-12-29 DIAGNOSIS — R29810 Facial weakness: Secondary | ICD-10-CM | POA: Diagnosis present

## 2022-12-29 DIAGNOSIS — Z6824 Body mass index (BMI) 24.0-24.9, adult: Secondary | ICD-10-CM

## 2022-12-29 DIAGNOSIS — G936 Cerebral edema: Secondary | ICD-10-CM | POA: Diagnosis present

## 2022-12-29 DIAGNOSIS — Z803 Family history of malignant neoplasm of breast: Secondary | ICD-10-CM

## 2022-12-29 DIAGNOSIS — J95821 Acute postprocedural respiratory failure: Secondary | ICD-10-CM | POA: Diagnosis not present

## 2022-12-29 DIAGNOSIS — E46 Unspecified protein-calorie malnutrition: Secondary | ICD-10-CM | POA: Diagnosis present

## 2022-12-29 DIAGNOSIS — R471 Dysarthria and anarthria: Secondary | ICD-10-CM | POA: Diagnosis present

## 2022-12-29 DIAGNOSIS — G4733 Obstructive sleep apnea (adult) (pediatric): Secondary | ICD-10-CM | POA: Diagnosis present

## 2022-12-29 DIAGNOSIS — E785 Hyperlipidemia, unspecified: Secondary | ICD-10-CM | POA: Diagnosis present

## 2022-12-29 DIAGNOSIS — I63311 Cerebral infarction due to thrombosis of right middle cerebral artery: Principal | ICD-10-CM | POA: Diagnosis present

## 2022-12-29 DIAGNOSIS — R7303 Prediabetes: Secondary | ICD-10-CM | POA: Diagnosis present

## 2022-12-29 DIAGNOSIS — Z79899 Other long term (current) drug therapy: Secondary | ICD-10-CM

## 2022-12-29 DIAGNOSIS — I6389 Other cerebral infarction: Secondary | ICD-10-CM | POA: Diagnosis not present

## 2022-12-29 DIAGNOSIS — Z8249 Family history of ischemic heart disease and other diseases of the circulatory system: Secondary | ICD-10-CM

## 2022-12-29 DIAGNOSIS — Z833 Family history of diabetes mellitus: Secondary | ICD-10-CM

## 2022-12-29 DIAGNOSIS — Z8489 Family history of other specified conditions: Secondary | ICD-10-CM

## 2022-12-29 HISTORY — PX: IR US GUIDE VASC ACCESS RIGHT: IMG2390

## 2022-12-29 HISTORY — PX: IR CT HEAD LTD: IMG2386

## 2022-12-29 HISTORY — PX: IR PERCUTANEOUS ART THROMBECTOMY/INFUSION INTRACRANIAL INC DIAG ANGIO: IMG6087

## 2022-12-29 HISTORY — PX: RADIOLOGY WITH ANESTHESIA: SHX6223

## 2022-12-29 LAB — URINALYSIS, ROUTINE W REFLEX MICROSCOPIC
Bacteria, UA: NONE SEEN
Bilirubin Urine: NEGATIVE
Glucose, UA: NEGATIVE mg/dL
Hgb urine dipstick: NEGATIVE
Ketones, ur: 5 mg/dL — AB
Leukocytes,Ua: NEGATIVE
Nitrite: NEGATIVE
Protein, ur: NEGATIVE mg/dL
Specific Gravity, Urine: 1.032 — ABNORMAL HIGH (ref 1.005–1.030)
pH: 7 (ref 5.0–8.0)

## 2022-12-29 LAB — DIFFERENTIAL
Abs Immature Granulocytes: 0.02 10*3/uL (ref 0.00–0.07)
Basophils Absolute: 0 10*3/uL (ref 0.0–0.1)
Basophils Relative: 0 %
Eosinophils Absolute: 0 10*3/uL (ref 0.0–0.5)
Eosinophils Relative: 0 %
Immature Granulocytes: 0 %
Lymphocytes Relative: 23 %
Lymphs Abs: 1.1 10*3/uL (ref 0.7–4.0)
Monocytes Absolute: 0.2 10*3/uL (ref 0.1–1.0)
Monocytes Relative: 5 %
Neutro Abs: 3.3 10*3/uL (ref 1.7–7.7)
Neutrophils Relative %: 72 %

## 2022-12-29 LAB — PROTIME-INR
INR: 1.1 (ref 0.8–1.2)
Prothrombin Time: 14.7 s (ref 11.4–15.2)

## 2022-12-29 LAB — COMPREHENSIVE METABOLIC PANEL
ALT: 29 U/L (ref 0–44)
AST: 37 U/L (ref 15–41)
Albumin: 3.9 g/dL (ref 3.5–5.0)
Alkaline Phosphatase: 41 U/L (ref 38–126)
Anion gap: 12 (ref 5–15)
BUN: 28 mg/dL — ABNORMAL HIGH (ref 8–23)
CO2: 23 mmol/L (ref 22–32)
Calcium: 9.7 mg/dL (ref 8.9–10.3)
Chloride: 105 mmol/L (ref 98–111)
Creatinine, Ser: 0.81 mg/dL (ref 0.44–1.00)
GFR, Estimated: >60 mL/min (ref >60)
Glucose, Bld: 136 mg/dL — ABNORMAL HIGH (ref 70–99)
Potassium: 3.7 mmol/L (ref 3.5–5.1)
Sodium: 140 mmol/L (ref 135–145)
Total Bilirubin: 1.4 mg/dL — ABNORMAL HIGH (ref <1.2)
Total Protein: 7.6 g/dL (ref 6.5–8.1)

## 2022-12-29 LAB — CBC
HCT: 35.8 % — ABNORMAL LOW (ref 36.0–46.0)
Hemoglobin: 12 g/dL (ref 12.0–15.0)
MCH: 27.8 pg (ref 26.0–34.0)
MCHC: 33.5 g/dL (ref 30.0–36.0)
MCV: 82.9 fL (ref 80.0–100.0)
Platelets: 144 10*3/uL — ABNORMAL LOW (ref 150–400)
RBC: 4.32 MIL/uL (ref 3.87–5.11)
RDW: 14 % (ref 11.5–15.5)
WBC: 4.7 10*3/uL (ref 4.0–10.5)
nRBC: 0 % (ref 0.0–0.2)

## 2022-12-29 LAB — POCT I-STAT 7, (LYTES, BLD GAS, ICA,H+H)
Acid-Base Excess: 0 mmol/L (ref 0.0–2.0)
Bicarbonate: 26 mmol/L (ref 20.0–28.0)
Calcium, Ion: 1.25 mmol/L (ref 1.15–1.40)
HCT: 36 % (ref 36.0–46.0)
Hemoglobin: 12.2 g/dL (ref 12.0–15.0)
O2 Saturation: 100 %
Potassium: 3.5 mmol/L (ref 3.5–5.1)
Sodium: 141 mmol/L (ref 135–145)
TCO2: 27 mmol/L (ref 22–32)
pCO2 arterial: 45.2 mm[Hg] (ref 32–48)
pH, Arterial: 7.367 (ref 7.35–7.45)
pO2, Arterial: 388 mm[Hg] — ABNORMAL HIGH (ref 83–108)

## 2022-12-29 LAB — CBG MONITORING, ED: Glucose-Capillary: 122 mg/dL — ABNORMAL HIGH (ref 70–99)

## 2022-12-29 LAB — RAPID URINE DRUG SCREEN, HOSP PERFORMED
Amphetamines: NOT DETECTED
Barbiturates: NOT DETECTED
Benzodiazepines: NOT DETECTED
Cocaine: NOT DETECTED
Opiates: NOT DETECTED
Tetrahydrocannabinol: NOT DETECTED

## 2022-12-29 LAB — I-STAT CHEM 8, ED
BUN: 36 mg/dL — ABNORMAL HIGH (ref 8–23)
Calcium, Ion: 1.13 mmol/L — ABNORMAL LOW (ref 1.15–1.40)
Chloride: 104 mmol/L (ref 98–111)
Creatinine, Ser: 0.9 mg/dL (ref 0.44–1.00)
Glucose, Bld: 139 mg/dL — ABNORMAL HIGH (ref 70–99)
HCT: 42 % (ref 36.0–46.0)
Hemoglobin: 14.3 g/dL (ref 12.0–15.0)
Potassium: 3.8 mmol/L (ref 3.5–5.1)
Sodium: 143 mmol/L (ref 135–145)
TCO2: 27 mmol/L (ref 22–32)

## 2022-12-29 LAB — ETHANOL: Alcohol, Ethyl (B): <10 mg/dL (ref <10)

## 2022-12-29 LAB — APTT: aPTT: 27 s (ref 24–36)

## 2022-12-29 LAB — MRSA NEXT GEN BY PCR, NASAL: MRSA by PCR Next Gen: NOT DETECTED

## 2022-12-29 SURGERY — IR WITH ANESTHESIA
Anesthesia: General

## 2022-12-29 MED ORDER — SPIRONOLACTONE 12.5 MG HALF TABLET
12.5000 mg | ORAL_TABLET | Freq: Every day | ORAL | Status: DC
  Administered 2022-12-29 – 2023-01-11 (×14): 12.5 mg
  Filled 2022-12-29 (×14): qty 1

## 2022-12-29 MED ORDER — STROKE: EARLY STAGES OF RECOVERY BOOK
Freq: Once | Status: AC
  Filled 2022-12-29: qty 1

## 2022-12-29 MED ORDER — PROPOFOL 1000 MG/100ML IV EMUL
5.0000 ug/kg/min | INTRAVENOUS | Status: DC
Start: 2022-12-29 — End: 2022-12-30
  Administered 2022-12-29 (×2): 50 ug/kg/min via INTRAVENOUS
  Administered 2022-12-30 (×2): 60 ug/kg/min via INTRAVENOUS
  Filled 2022-12-29 (×4): qty 100

## 2022-12-29 MED ORDER — ONDANSETRON HCL 4 MG/2ML IJ SOLN: 4.0000 mg | Freq: Four times a day (QID) | INTRAMUSCULAR | Status: DC | PRN

## 2022-12-29 MED ORDER — FENTANYL CITRATE PF 50 MCG/ML IJ SOSY
50.0000 ug | PREFILLED_SYRINGE | INTRAMUSCULAR | Status: DC | PRN
  Filled 2022-12-29: qty 1

## 2022-12-29 MED ORDER — LIDOCAINE 2% (20 MG/ML) 5 ML SYRINGE
INTRAMUSCULAR | Status: DC | PRN
  Administered 2022-12-29: 60 mg via INTRAVENOUS

## 2022-12-29 MED ORDER — IRBESARTAN 150 MG PO TABS
75.0000 mg | ORAL_TABLET | Freq: Every day | ORAL | Status: DC
  Administered 2022-12-29 – 2022-12-30 (×2): 75 mg
  Filled 2022-12-29 (×2): qty 1

## 2022-12-29 MED ORDER — IPRATROPIUM-ALBUTEROL 0.5-2.5 (3) MG/3ML IN SOLN: 3.0000 mL | Freq: Four times a day (QID) | RESPIRATORY_TRACT | Status: DC | PRN

## 2022-12-29 MED ORDER — FENTANYL CITRATE PF 50 MCG/ML IJ SOSY
25.0000 ug | PREFILLED_SYRINGE | Freq: Once | INTRAMUSCULAR | Status: AC
  Administered 2022-12-29: 25 ug via INTRAVENOUS

## 2022-12-29 MED ORDER — ACETAMINOPHEN 650 MG RE SUPP: 650.0000 mg | RECTAL | Status: DC | PRN

## 2022-12-29 MED ORDER — ORAL CARE MOUTH RINSE: 15.0000 mL | OROMUCOSAL | Status: DC

## 2022-12-29 MED ORDER — IOHEXOL 350 MG/ML SOLN
100.0000 mL | Freq: Once | INTRAVENOUS | Status: AC | PRN
  Administered 2022-12-29: 100 mL via INTRAVENOUS

## 2022-12-29 MED ORDER — CLEVIDIPINE BUTYRATE 0.5 MG/ML IV EMUL: 0.0000 mg/h | INTRAVENOUS | Status: DC

## 2022-12-29 MED ORDER — LACTATED RINGERS IV SOLN: INTRAVENOUS | Status: DC | PRN

## 2022-12-29 MED ORDER — ORAL CARE MOUTH RINSE: 15.0000 mL | OROMUCOSAL | Status: DC | PRN

## 2022-12-29 MED ORDER — FAMOTIDINE 20 MG PO TABS
20.0000 mg | ORAL_TABLET | Freq: Every day | ORAL | Status: DC
  Administered 2022-12-29 – 2023-01-11 (×14): 20 mg
  Filled 2022-12-29 (×14): qty 1

## 2022-12-29 MED ORDER — PROPOFOL 10 MG/ML IV BOLUS
INTRAVENOUS | Status: DC | PRN
  Administered 2022-12-29: 50 ug/kg/min via INTRAVENOUS
  Administered 2022-12-29: 100 mg via INTRAVENOUS

## 2022-12-29 MED ORDER — PHENYLEPHRINE 80 MCG/ML (10ML) SYRINGE FOR IV PUSH (FOR BLOOD PRESSURE SUPPORT)
PREFILLED_SYRINGE | INTRAVENOUS | Status: DC | PRN
  Administered 2022-12-29: 80 ug via INTRAVENOUS
  Administered 2022-12-29: 50 ug via INTRAVENOUS
  Administered 2022-12-29 (×3): 80 ug via INTRAVENOUS

## 2022-12-29 MED ORDER — ROCURONIUM BROMIDE 10 MG/ML (PF) SYRINGE
PREFILLED_SYRINGE | INTRAVENOUS | Status: DC | PRN
  Administered 2022-12-29 (×2): 10 mg via INTRAVENOUS
  Administered 2022-12-29: 40 mg via INTRAVENOUS

## 2022-12-29 MED ORDER — ONDANSETRON HCL 4 MG/2ML IJ SOLN
INTRAMUSCULAR | Status: DC | PRN
  Administered 2022-12-29: 4 mg via INTRAVENOUS

## 2022-12-29 MED ORDER — SODIUM CHLORIDE 0.9 % IV SOLN: INTRAVENOUS | Status: DC

## 2022-12-29 MED ORDER — SODIUM CHLORIDE 0.9 % IV SOLN: INTRAVENOUS | Status: DC | PRN

## 2022-12-29 MED ORDER — ACETAMINOPHEN 325 MG PO TABS: 650.0000 mg | ORAL_TABLET | ORAL | Status: DC | PRN

## 2022-12-29 MED ORDER — FENTANYL CITRATE (PF) 100 MCG/2ML IJ SOLN
INTRAMUSCULAR | Status: AC
  Filled 2022-12-29: qty 2

## 2022-12-29 MED ORDER — SUCCINYLCHOLINE CHLORIDE 200 MG/10ML IV SOSY
PREFILLED_SYRINGE | INTRAVENOUS | Status: DC | PRN
  Administered 2022-12-29: 100 mg via INTRAVENOUS

## 2022-12-29 MED ORDER — FENTANYL CITRATE PF 50 MCG/ML IJ SOSY
25.0000 ug | PREFILLED_SYRINGE | Freq: Once | INTRAMUSCULAR | Status: DC
  Filled 2022-12-29: qty 1

## 2022-12-29 MED ORDER — CLEVIDIPINE BUTYRATE 0.5 MG/ML IV EMUL
0.0000 mg/h | INTRAVENOUS | Status: DC
  Administered 2022-12-29: 2 mg/h via INTRAVENOUS
  Administered 2022-12-29: 4 mg/h via INTRAVENOUS
  Administered 2022-12-29: 6 mg/h via INTRAVENOUS
  Administered 2022-12-30: 8 mg/h via INTRAVENOUS
  Administered 2022-12-30: 6 mg/h via INTRAVENOUS
  Administered 2022-12-30: 8 mg/h via INTRAVENOUS
  Filled 2022-12-29 (×2): qty 50
  Filled 2022-12-29 (×2): qty 100
  Filled 2022-12-29 (×2): qty 50

## 2022-12-29 MED ORDER — ACETAMINOPHEN 160 MG/5ML PO SOLN: 650.0000 mg | ORAL | Status: DC | PRN

## 2022-12-29 MED ORDER — SODIUM CHLORIDE 0.9 % IV BOLUS: 250.0000 mL | INTRAVENOUS | Status: AC | PRN

## 2022-12-29 MED ORDER — FUROSEMIDE 40 MG PO TABS
40.0000 mg | ORAL_TABLET | Freq: Every day | ORAL | Status: DC
  Administered 2022-12-29 – 2022-12-30 (×2): 40 mg
  Filled 2022-12-29 (×2): qty 1

## 2022-12-29 MED ORDER — SODIUM CHLORIDE 0.9 % IV BOLUS: 250.0000 mL | INTRAVENOUS | Status: DC | PRN

## 2022-12-29 MED ORDER — IOHEXOL 300 MG/ML  SOLN
150.0000 mL | Freq: Once | INTRAMUSCULAR | Status: AC | PRN
  Administered 2022-12-29: 100 mL via INTRA_ARTERIAL

## 2022-12-29 MED ORDER — CLEVIDIPINE BUTYRATE 0.5 MG/ML IV EMUL
INTRAVENOUS | Status: AC
  Filled 2022-12-29: qty 50

## 2022-12-29 MED ORDER — CHLORHEXIDINE GLUCONATE CLOTH 2 % EX PADS
6.0000 | MEDICATED_PAD | Freq: Every day | CUTANEOUS | Status: DC
Start: 2022-12-29 — End: 2023-01-05
  Administered 2022-12-29 – 2023-01-04 (×7): 6 via TOPICAL

## 2022-12-29 MED ORDER — ACETAMINOPHEN 160 MG/5ML PO SOLN
650.0000 mg | ORAL | Status: DC | PRN
  Administered 2022-12-30 – 2023-01-08 (×10): 650 mg
  Filled 2022-12-29 (×10): qty 20.3

## 2022-12-29 MED ORDER — ORAL CARE MOUTH RINSE
15.0000 mL | OROMUCOSAL | Status: DC
  Administered 2022-12-29 – 2022-12-30 (×13): 15 mL via OROMUCOSAL

## 2022-12-29 MED ORDER — PHENYLEPHRINE HCL-NACL 20-0.9 MG/250ML-% IV SOLN
INTRAVENOUS | Status: DC | PRN
  Administered 2022-12-29: 50 ug/min via INTRAVENOUS
  Administered 2022-12-29: 40 ug/min via INTRAVENOUS

## 2022-12-29 NOTE — Consult Note (Signed)
NAME:  Jocelyn [name], MRN:  [id number], DOB:  1931-11-29, LOS: 0 ADMISSION DATE:  12/29/2022, CONSULTATION DATE:  12/29/22 REFERRING MD:  Neuro, CHIEF COMPLAINT:  stroke symptoms   History of Present Illness:  89 y.o. woman history of hypertension diastolic congestive heart failure last known normal around 11 PM 11/27 with acute left-sided weakness found to have right MCA occlusion status post revascularization, complicated arterial cannulation requiring multiple sticks and multiple sticks ultimately cannulated via the carotid artery remains intubated after procedure.  On the vent.  Sedated.  Appears to still be somewhat paralyzed.  Blood pressure is elevated.  On propofol.  Adding clevidipine.  Reordering home antihypertensives, ARB at lower dose.  Reportedly she is fully independent at baseline.  History of atrial fibrillation on Eliquis.  Will hold given multiple arterial cannulation's.  Not a TNK candidate because of this.  Pertinent  Medical History  A-fib RVR on Eliquis, HTN  Significant Hospital Events: Including procedures, antibiotic start and stop dates in addition to other pertinent events   11/28 admitted with stroke symptoms right MCA occlusion status post revascularization with complicated vascular access requiring carotid cannulation  Interim History / Subjective:    Objective   Blood pressure (!) 164/68, weight 68.6 kg.        Intake/Output Summary (Last 24 hours) at 12/29/2022 1457 Last data filed at 12/29/2022 1432 Gross per 24 hour  Intake 1000 ml  Output --  Net 1000 ml   Filed Weights   12/29/22 1100  Weight: 68.6 kg    Examination: General: Elderly, lying in bed HENT: Circular red bloody lesion on the back of head Lungs: Clear, ventilated breath sounds Cardiovascular: Regular rate and rhythm, no murmur appreciated Abdomen: Nondistended, bowel sounds present Extremities: Edema Neuro: Deeply sedated, still paralyzed from OR  Resolved Hospital  Problem list     Assessment & Plan:  Ventilator dependence after procedure: Given direct carotid access while on history of anticoagulation recommend strict bedrest with minimal movement for the next several hours. -- PRVC -- Plan for relative deep sedation RASS goal -3, tentative plan for propofol and fentanyl if needed -- SAT/SBT in a.m. 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 Previous prescribed combination pill amlodipine hydrochlorothiazide olmesartan. --resume lasix and spiro, irbesartan (at 1/4 reported old home dose - equivalent would be 300 mg daily)  Afib: -- Rate controlled currently -- Hold eliquis with stroke and carotid access  Best Practice (right click and "Reselect all SmartList Selections" daily)   Per primary  Labs   CBC: Recent Labs  Lab 12/29/22 1121  HGB 14.3  HCT 42.0    Basic Metabolic Panel: Recent Labs  Lab 12/29/22 1113 12/29/22 1121  NA 140 143  K 3.7 3.8  CL 105 104  CO2 23  --   GLUCOSE 136* 139*  BUN 28* 36*  CREATININE 0.81 0.90  CALCIUM 9.7  --    GFR: Estimated Creatinine Clearance: 36.6 mL/min (by C-G formula based on SCr of 0.9 mg/dL). No results for input(s): "PROCALCITON", "WBC", "LATICACIDVEN" in the last 168 hours.  Liver Function Tests: Recent Labs  Lab 12/29/22 1113  AST 37  ALT 29  ALKPHOS 41  BILITOT 1.4*  PROT 7.6  ALBUMIN 3.9   No results for input(s): "  LIPASE", "AMYLASE" in the last 168 hours. No results for input(s): "AMMONIA" in the last 168 hours.  ABG    Component Value Date/Time   TCO2 27 12/29/2022 1121     Coagulation Profile: Recent Labs  Lab 12/29/22 1140  INR 1.1    Cardiac Enzymes: No results for input(s): "CKTOTAL", "CKMB", "CKMBINDEX",  "TROPONINI" in the last 168 hours.  HbA1C: Hgb A1c MFr Bld  Date/Time Value Ref Range Status  12/08/2017 02:58 PM 6.1 (H) <5.7 % of total Hgb Final    Comment:    For someone without known diabetes, a hemoglobin  A1c value between 5.7% and 6.4% is consistent with prediabetes and should be confirmed with a  follow-up test. . For someone with known diabetes, a value <7% indicates that their diabetes is well controlled. A1c targets should be individualized based on duration of diabetes, age, comorbid conditions, and other considerations. . This assay result is consistent with an increased risk of diabetes. . Currently, no consensus exists regarding use of hemoglobin A1c for diagnosis of diabetes for children. Marland Kitchen   02/03/2017 09:12 AM 6.3 (H) <5.7 % of total Hgb Final    Comment:    For someone without known diabetes, a hemoglobin  A1c value between 5.7% and 6.4% is consistent with prediabetes and should be confirmed with a  follow-up test. . For someone with known diabetes, a value <7% indicates that their diabetes is well controlled. A1c targets should be individualized based on duration of diabetes, age, comorbid conditions, and other considerations. . This assay result is consistent with an increased risk of diabetes. . Currently, no consensus exists regarding use of hemoglobin A1c for diagnosis of diabetes for children. .     CBG: Recent Labs  Lab 12/29/22 1113  GLUCAP 122*    Review of Systems:   Unobtainable due to patient intubated and sedated  Past Medical History:  She,  has a past medical history of Arthritis of both knees (08/01/2015), Colitis, Essential hypertension, benign (07/28/2014), GERD (gastroesophageal reflux disease), iron deficiency anemia (09/02/2015), Hyperlipidemia, Hypertension, Lumbar pain, Medicare annual wellness visit, subsequent (04/07/2017), OSA on CPAP (08/01/2015), Overweight (BMI 25.0-29.9) (07/10/2013), Prediabetes (09/02/2015), and SOB  (shortness of breath) (07/10/2013).   Surgical History:   Past Surgical History:  Procedure Laterality Date   ABDOMINAL HYSTERECTOMY     CATARACT EXTRACTION     CHOLECYSTECTOMY     COLONOSCOPY WITH PROPOFOL N/A 04/09/2018   Procedure: COLONOSCOPY WITH PROPOFOL;  Surgeon: Toney Reil, MD;  Location: [facility] ENDOSCOPY;  Service: Gastroenterology;  Laterality: N/A;   LUMBAR DISC SURGERY       Social History:   reports that she has never smoked. She has never used smokeless tobacco. She reports that she does not drink alcohol and does not use drugs.   Family History:  Her family history includes Anuerysm in her son; Cancer in her daughter and father; Diabetes in her daughter and mother; Heart disease in her mother; Hypertension in her daughter. There is no history of Breast cancer.   Allergies No Known Allergies   Home Medications  Prior to Admission medications   Medication Sig Start Date End Date Taking? Authorizing Provider  albuterol (VENTOLIN HFA) 108 (90 Base) MCG/ACT inhaler Inhale 1 puff into the lungs as needed. 09/26/19   [provider]  alendronate (FOSAMAX) 70 MG tablet Take 70 mg by mouth once a week. 06/01/20   [provider]  apixaban (ELIQUIS) 5 MG TABS tablet TAKE 1 TABLET BY MOUTH  TWICE A DAY 10/10/22   Furth, Cadence H, PA-C  calcium citrate-vitamin D 500-400 MG-UNIT chewable tablet Chew 1 tablet by mouth 2 (two) times daily.     [provider]  clotrimazole (LOTRIMIN) 1 % cream Apply 1 Application topically 2 (two) times daily. 11/03/22   [provider]  ferrous sulfate 325 (65 FE) MG tablet Take 1 tablet (325 mg total) by mouth daily. 12/03/18   Doren Custard, FNP  furosemide (LASIX) 40 MG tablet Take 1 tablet (40 mg total) by mouth daily. 12/07/22 03/07/23  Bensimhon, Bevelyn Buckles, MD  HYDROcodone-acetaminophen (NORCO/VICODIN) 5-325 MG tablet Take 1 tablet by mouth every 12 (twelve) hours as needed for severe pain (pain score 7-10). Must  last 30 days. 12/06/22 01/05/23  Edward Jolly, MD  HYDROcodone-acetaminophen (NORCO/VICODIN) 5-325 MG tablet Take 1 tablet by mouth every 12 (twelve) hours as needed for severe pain (pain score 7-10). Must last 30 days. 01/05/23 02/04/23  Edward Jolly, MD  isosorbide mononitrate (IMDUR) 120 MG 24 hr tablet TAKE 1 TABLET BY MOUTH EVERY DAY 07/26/22   End, Cristal Deer, MD  Olmesartan-amLODIPine-HCTZ 40-10-12.5 MG TABS Take 1 tablet by mouth daily. For blood pressure 11/22/18   Doren Custard, FNP  omeprazole (PRILOSEC) 20 MG capsule Take 20 mg by mouth daily. 02/19/19   [provider]  spironolactone (ALDACTONE) 25 MG tablet Take 0.5 tablets (12.5 mg total) by mouth daily. 12/07/22 03/07/23  Bensimhon, Bevelyn Buckles, MD     Critical care time:     CRITICAL CARE Performed by: Karren Burly   Total critical care time: 35 minutes  Critical care time was exclusive of separately billable procedures and treating other patients.  Critical care was necessary to treat or prevent imminent or life-threatening deterioration.  Critical care was time spent personally by me on the following activities: development of treatment plan with patient and/or surrogate as well as nursing, discussions with consultants, evaluation of patient's response to treatment, examination of patient, obtaining history from patient or surrogate, ordering and performing treatments and interventions, ordering and review of laboratory studies, ordering and review of radiographic studies, pulse oximetry and re-evaluation of patient's condition.

## 2022-12-29 NOTE — Anesthesia Procedure Notes (Signed)
Procedure Name: Intubation Date/Time: 12/29/2022 12:04 PM  Performed by: Loleta Zachery Niswander, CRNAPre-anesthesia Checklist: Patient identified, Emergency Drugs available, Suction available and Patient being monitored Patient Re-evaluated:Patient Re-evaluated prior to induction Oxygen Delivery Method: Circle system utilized Preoxygenation: Pre-oxygenation with 100% oxygen Induction Type: IV induction and Rapid sequence Laryngoscope Size: Mac and 4 Grade View: Grade I Tube type: Oral Tube size: 7.0 mm Number of attempts: 1 Airway Equipment and Method: Stylet and Oral airway Placement Confirmation: ETT inserted through vocal cords under direct vision, positive ETCO2 and breath sounds checked- equal and bilateral Secured at: 21 cm Tube secured with: Tape Dental Injury: Teeth and Oropharynx as per pre-operative assessment

## 2022-12-29 NOTE — H&P (Signed)
NEUROLOGY H&P NOTE   Date of service: December 29, 2022 Patient Name: Jocelyn [name] MRN:  [id number] DOB:  10-04-1931 Chief Complaint: "Code stroke"  History of Present Illness  ELI RYDALCH is a 89 y.o. female with hx of GERD, prediabetes, permanent afibb on eliquis, HTN, HLD, OSA, also has a history of atrial fibrillation on Eliquis and appears to have been compliant with her medications.  She presented today after her brother found her unresponsive and thought that she was not breathing.  However, upon arrival of EMS, she was moving her right side and attempting to talk, although she was confused.  She was noted to have left-sided weakness, and code stroke was activated.  Patient is 89 years old but lives independently and cares for herself, manages her own medications and affairs. Daughter reports that she will drive her to appointments. On exam, she will attempt to answer questions but appears disoriented.  She has a right gaze deviation and is unable to move her left arm at all but will flicker her left leg to noxious stimuli.  Head CT shows no acute abnormalities, acute clot at the right ICA Bifurcation and acute obstructing right MCA M2 clot was seen on CT angiogram with large penumbra on CT perfusion.  Last known well: 11/27 2300 Modified rankin score: 1-No significant post stroke disability and can perform usual duties with stroke symptoms IV Thrombolysis: No, outside of window Thrombectomy: Yes, acute right MCA occlusion NIHSS components Score: Comment  1a Level of Conscious 0[x]  1[]  2[]  3[]      1b LOC Questions 0[]  1[]  2[x]       1c LOC Commands 0[x]  1[]  2[]       2 Best Gaze 0[]  1[]  2[x]       3 Visual 0[]  1[]  2[x]  3[]      4 Facial Palsy 0[]  1[x]  2[]  3[]      5a Motor Arm - left 0[]  1[]  2[]  3[]  4[x]  UN[]    5b Motor Arm - Right 0[x]  1[]  2[]  3[]  4[]  UN[]    6a Motor Leg - Left 0[]  1[]  2[]  3[x]  4[]  UN[]    6b Motor Leg - Right 0[]  1[x]  2[]  3[]  4[]  UN[]    7 Limb Ataxia 0[x]  1[]  2[]   3[]  UN[]     8 Sensory 0[]  1[]  2[x]  UN[]      9 Best Language 0[x]  1[]  2[]  3[]      10 Dysarthria 0[]  1[x]  2[]  UN[]      11 Extinct. and Inattention 0[]  1[x]  2[]       TOTAL:19       ROS   Unable to perform due to altered mental status.  Past History   Past Medical History:  Diagnosis Date   Arthritis of both knees 08/01/2015   Colitis    Essential hypertension, benign 07/28/2014   GERD (gastroesophageal reflux disease)    Hx of iron deficiency anemia 09/02/2015   Hyperlipidemia    Hypertension    Lumbar pain    Medicare annual wellness visit, subsequent 04/07/2017   OSA on CPAP 08/01/2015   Overweight (BMI 25.0-29.9) 07/10/2013   Prediabetes 09/02/2015   SOB (shortness of breath) 07/10/2013   Past Surgical History:  Procedure Laterality Date   ABDOMINAL HYSTERECTOMY     CATARACT EXTRACTION     CHOLECYSTECTOMY     COLONOSCOPY WITH PROPOFOL N/A 04/09/2018   Procedure: COLONOSCOPY WITH PROPOFOL;  Surgeon: Toney Reil, MD;  Location: [facility] ENDOSCOPY;  Service: Gastroenterology;  Laterality: N/A;   LUMBAR DISC SURGERY     Family History  Problem Relation Age of Onset   Diabetes Mother    Heart disease Mother    Cancer Father        lung; smoker   Hypertension Daughter    Diabetes Daughter    Cancer Daughter        breast   Anuerysm Son    Breast cancer Neg Hx    Social History   Socioeconomic History   Marital status: Widowed    Spouse name: Not on file   Number of children: 5   Years of education: Not on file   Highest education level: Not on file  Occupational History   Not on file  Tobacco Use   Smoking status: Never   Smokeless tobacco: Never  Vaping Use   Vaping status: Never Used  Substance and Sexual Activity   Alcohol use: No    Alcohol/week: 0.0 standard drinks of alcohol   Drug use: No   Sexual activity: Not Currently  Other Topics Concern   Not on file  Social History Narrative   Not on file   Social Determinants of Health   Financial Resource  Strain: Medium Risk (07/26/2018)   Overall Financial Resource Strain (CARDIA)    Difficulty of Paying Living Expenses: Somewhat hard  Food Insecurity: Food Insecurity Present (07/26/2018)   Hunger Vital Sign    Worried About Running Out of Food in the Last Year: Sometimes true    Ran Out of Food in the Last Year: Never true  Transportation Needs: No Transportation Needs (07/26/2018)   PRAPARE - Administrator, Civil Service (Medical): No    Lack of Transportation (Non-Medical): No  Physical Activity: Inactive (07/26/2018)   Exercise Vital Sign    Days of Exercise per Week: 0 days    Minutes of Exercise per Session: 0 min  Stress: No Stress Concern Present (07/26/2018)   [employer] of Occupational Health - Occupational Stress Questionnaire    Feeling of Stress : Not at all  Social Connections: Moderately Isolated (07/26/2018)   Social Connection and Isolation Panel [NHANES]    Frequency of Communication with Friends and Family: More than three times a week    Frequency of Social Gatherings with Friends and Family: Three times a week    Attends Religious Services: More than 4 times per year    Active Member of Clubs or Organizations: No    Attends Banker Meetings: Never    Marital Status: Widowed   No Known Allergies  Medications   Current Facility-Administered Medications:    [START ON 12/30/2022]  stroke: early stages of recovery book, , Does not apply, Once, de La Torre, Cortney E, NP   fentaNYL (SUBLIMAZE) injection 25 mcg, 25 mcg, Intravenous, Once, de Saintclair Halsted, Cortney E, NP  Current Outpatient Medications:    albuterol (VENTOLIN HFA) 108 (90 Base) MCG/ACT inhaler, Inhale 1 puff into the lungs as needed., Disp: , Rfl:    alendronate (FOSAMAX) 70 MG tablet, Take 70 mg by mouth once a week., Disp: , Rfl:    apixaban (ELIQUIS) 5 MG TABS tablet, TAKE 1 TABLET BY MOUTH TWICE A DAY, Disp: 60 tablet, Rfl: 5   calcium citrate-vitamin D 500-400 MG-UNIT  chewable tablet, Chew 1 tablet by mouth 2 (two) times daily. , Disp: , Rfl:    clotrimazole (LOTRIMIN) 1 % cream, Apply 1 Application topically 2 (two) times daily., Disp: , Rfl:    ferrous sulfate 325 (65 FE) MG tablet, Take 1 tablet (325  mg total) by mouth daily., Disp: 30 tablet, Rfl: 11   furosemide (LASIX) 40 MG tablet, Take 1 tablet (40 mg total) by mouth daily., Disp: 90 tablet, Rfl: 3   HYDROcodone-acetaminophen (NORCO/VICODIN) 5-325 MG tablet, Take 1 tablet by mouth every 12 (twelve) hours as needed for severe pain (pain score 7-10). Must last 30 days., Disp: 60 tablet, Rfl: 0   [START ON 01/05/2023] HYDROcodone-acetaminophen (NORCO/VICODIN) 5-325 MG tablet, Take 1 tablet by mouth every 12 (twelve) hours as needed for severe pain (pain score 7-10). Must last 30 days., Disp: 60 tablet, Rfl: 0   isosorbide mononitrate (IMDUR) 120 MG 24 hr tablet, TAKE 1 TABLET BY MOUTH EVERY DAY, Disp: 90 tablet, Rfl: 0   Olmesartan-amLODIPine-HCTZ 40-10-12.5 MG TABS, Take 1 tablet by mouth daily. For blood pressure, Disp: 90 tablet, Rfl: 1   omeprazole (PRILOSEC) 20 MG capsule, Take 20 mg by mouth daily., Disp: , Rfl:    spironolactone (ALDACTONE) 25 MG tablet, Take 0.5 tablets (12.5 mg total) by mouth daily., Disp: 45 tablet, Rfl: 3  Facility-Administered Medications Ordered in Other Encounters:    0.9 %  sodium chloride infusion, , Intravenous, Continuous PRN, Loleta Rose, CRNA, New Bag at 12/29/22 1152   lactated ringers infusion, , Intravenous, Continuous PRN, Loleta Rose, CRNA, New Bag at 12/29/22 1206   lidocaine 2% (20 mg/mL) 5 mL syringe, , Intravenous, Anesthesia Intra-op, Loleta Rose, CRNA, 60 mg at 12/29/22 1201   phenylephrine (NEO-SYNEPHRINE) 20mg /NS premix infusion, , Intravenous, Continuous PRN, Loleta Rose, CRNA, Last Rate: 11.25 mL/hr at 12/29/22 1252, 15 mcg/min at 12/29/22 1252   PHENYLephrine 80 mcg/ml in normal saline Adult IV Push Syringe (For Blood Pressure Support), ,  Intravenous, Anesthesia Intra-op, Loleta Rose, CRNA, 80 mcg at 12/29/22 1250   propofol (DIPRIVAN) 10 mg/mL bolus/IV push, , Intravenous, Anesthesia Intra-op, Loleta Rose, CRNA, 100 mg at 12/29/22 1201   rocuronium (ZEMURON) injection, , Intravenous, Anesthesia Intra-op, Loleta Rose, CRNA, 40 mg at 12/29/22 1211   succinylcholine (ANECTINE) syringe, , Intravenous, Anesthesia Intra-op, Loleta Rose, CRNA, 100 mg at 12/29/22 1201   Vitals   Vitals:   12/29/22 1100  Weight: 68.6 kg     Body mass index is 29.54 kg/m.  Physical Exam   Constitutional: Well-developed, well-nourished elderly patient in no acute distress Eyes: No scleral injection.  HENT: No OP obstruction.  Head: Normocephalic.  Respiratory: Effort normal, non-labored breathing.  Skin: WDI.   Neurologic Examination    NEURO:  Mental Status: Patient is alert and responds to name and will attempt to answer simple questions.  She is disoriented to place time and situation Speech/Language: speech is with mild dysarthria and no aphasia, able to name objects, speaks in short phrases or single words  Cranial Nerves:  II: PERRL.  Left hemianopsia, does not blink to threat on the left III, IV, VI: Right gaze deviation V: Sensation is intact to light touch and symmetrical to face.  VII: Left-sided facial droop VIII: hearing intact to voice. IX, X: Voice is slightly dysarthric XII: tongue is midline without fasciculations. Motor: Able to move right upper extremity with good antigravity strength, no movement of left upper extremity, able to move right lower extremity with antigravity strength, withdraws left lower extremity to noxious slightly Tone: is normal and bulk is normal Sensation-appears diminished on the left  Coordination: Unable to perform Gait- deferred    Labs   CBC:  Recent Labs  Lab 12/29/22 1121  HGB 14.3  HCT 42.0   Basic  Metabolic Panel:  Lab Results  Component Value Date   NA 143  12/29/2022   K 3.8 12/29/2022   CO2 23 12/29/2022   GLUCOSE 139 (H) 12/29/2022   BUN 36 (H) 12/29/2022   CREATININE 0.90 12/29/2022   CALCIUM 9.7 12/29/2022   GFRNONAA >60 12/29/2022   GFRAA 78 08/01/2018   Lipid Panel:  Lab Results  Component Value Date   LDLCALC 57 03/31/2021   HgbA1c:  Lab Results  Component Value Date   HGBA1C 6.1 (H) 12/08/2017   Urine Drug Screen: No results found for: "LABOPIA", "COCAINSCRNUR", "LABBENZ", "AMPHETMU", "THCU", "LABBARB"  Alcohol Level     Component Value Date/Time   ETH <10 12/29/2022 1117   INR  Lab Results  Component Value Date   INR 1.1 12/29/2022   APTT  Lab Results  Component Value Date   APTT 27 12/29/2022     CT Head without contrast(Personally reviewed): Cytotoxic edema in the right stronger than insula, hyperdense right MCA  CT angio Head and Neck with contrast(Personally reviewed): Emergent LVO, clot at right ICA bifurcation and obstructing clot at the proximal right M2 level, bilateral vertebral origin stenosis  MRI Brain(Personally reviewed): Pending   Assessment   SHANTAI SHOBE is a 89 y.o. female with history of atrial fibrillation on Eliquis, CHF, diabetes, hypertension and sleep apnea on CPAP who presented with left-sided hemiplegia, right gaze deviation, left facial droop and dysarthria.  She appears to have been compliant with her Eliquis.  She has good functional status, lives alone and manages her own medications and affairs.  On exam, she was noted to be disoriented to place time and situation, was completely unable to move left arm, barely withdrew the left lower extremity to noxious but was he had good movement with antigravity strength in the right upper and lower extremities.  She also had left facial droop and right gaze deviation.  She was found to have clots of the right ICA bifurcation and LVO at proximal right M2.  She presented outside of the window for TNK.  Risks and benefits of mechanical  thrombectomy were discussed with patient's daughter Ladean Raya, who agreed to the procedure.  Primary Diagnosis:  Cerebral infarction due to thrombosis of right middle cerebral artery.   Secondary Diagnosis: Chronic atrial fibrillation  Recommendations  - Admit to ICU post mechanical thrombectomy - Keep systolic blood pressure 1 20-1 60 for first 24 hours or per interventional radiologist - Vital signs and NIHSS every 15 minutes for 2 hours, every 30 minutes for 6 hours and hourly thereafter - MRI brain wo contrast - TTE w/ bubble - Check A1c and LDL + add statin per guidelines - antiplt/anticoag to be determined after MRI - STAT head CT for any change in neuro exam - Tele - PT/OT/SLP - Stroke education - Amb referral to neurology upon discharge   ______________________________________________________________________   Signed, Cortney Harland Dingwall, NP [facility]  Risks, benefits and alternatives of IVT discussed with patient and/or family and they agreed.   NEUROHOSPITALIST ADDENDUM Performed a face to face diagnostic evaluation.   I have reviewed the contents of history and physical exam as documented by PA/ARNP/Resident and agree with above documentation.  I have discussed and formulated the above plan as documented. Edits to the note have been made as needed.  Impression/Key exam findings/Plan: 80F with afibb on eliquis and compliant per daughter, lives in dependently. Shewas last seen normal by her brother last night and then this AM, found  her weak on the left. He called EMS and she was brought in as a code stroke. She is not a candidate for tnkase due to patient taking eliquis and compliant. She was found to have R ICA bifurcation thrombus that was non occlusive along with a R MCA M2 occluding thrombus and case was discussed by Dr. Nyra Jabs with both of patient's daughter over the phone. Daughter's consented to thrombectomy. I witnessed the entirety of  the conversation.   This patient is critically ill and at significant risk of neurological worsening, death and care requires constant monitoring of vital signs, hemodynamics,respiratory and cardiac monitoring, neurological assessment, discussion with family, other specialists and medical decision making of high complexity. I spent 50 minutes of neurocritical care time  in the care of  this patient. This was time spent independent of any time provided by nurse practitioner or PA.  Erick Blinks [facility] 12/29/2022  3:00 PM  Erick Blinks, MD [facility] [phone]   If 7pm to 7am, please call on call as listed on AMION.

## 2022-12-29 NOTE — Anesthesia Procedure Notes (Signed)
Arterial Line Insertion Start/End11/28/2024 12:03 PM, 12/29/2022 12:05 PM Performed by: Little Ishikawa, CRNA, CRNA  Patient location: OOR procedure area. Preanesthetic checklist: patient identified, IV checked, site marked, risks and benefits discussed, surgical consent, monitors and equipment checked, pre-op evaluation, timeout performed and anesthesia consent Patient sedated Left, radial was placed Catheter size: 20 G Hand hygiene performed  and maximum sterile barriers used  Allen's test indicative of satisfactory collateral circulation Attempts: 1 Procedure performed without using ultrasound guided technique. Following insertion, dressing applied and Biopatch. Post procedure assessment: normal  Patient tolerated the procedure well with no immediate complications.

## 2022-12-29 NOTE — Procedures (Addendum)
INTERVENTIONAL NEURORADIOLOGY BRIEF POSTPROCEDURE NOTE  DIAGNOSTIC CEREBRAL ANGIOGRAM, MECHANICAL THROMBECTOMY, FLAT PANEL HEAD CT  Attending physician: Baldemar Lenis, MD  Diagnosis: Right ACA and right MCA occlusion  Access site:  Right common femoral artery.  Right radial artery.  Right CCA.  Access closure:  34F angioseal. TR band. 99F angioseal.  Anesthesia: IR sedation: General endotracheal anesthesia.  Complications: Inadvertent contrast injection in the neck soft tissues.  Estimated blood loss:  150 mL.  Specimen: None.  Findings: Occlusion of a proximal right M2/MCA, and right A1/ACA with clot extending into the carotid terminus.  Multiple attempts performed to access the right ICA via right common femoral artery and right radial artery proved unsuccessful. Attempted direct access to the right common carotid artery failed due to sheath retraction into the neck soft tissues. Successful access gained to distal right CCA just below the bifurcation.   Mechanical thrombectomy was performed with combined stent retriever and aspiration achieving TICI3 in both right MCA and ACA.  The patient tolerated the procedure well and was transferred to ICU in stable condition.   PLAN: - Bed rest x 6 hours for direct right carotid access and right femoral access. - Avoid manipulating the neck during bed rest. - SBP 120-160 mmHg.

## 2022-12-29 NOTE — ED Notes (Signed)
Pt in IR

## 2022-12-29 NOTE — Transfer of Care (Signed)
Immediate Anesthesia Transfer of Care Note  Patient: Jocelyn [name]  Procedure(s) Performed: IR WITH ANESTHESIA  Patient Location: ICU  Anesthesia Type:General  Level of Consciousness: Patient remains intubated per anesthesia plan  Airway & Oxygen Therapy: Patient remains intubated per anesthesia plan and Patient placed on Ventilator (see vital sign flow sheet for setting)  Post-op Assessment: Report given to RN and Post -op Vital signs reviewed and stable  Post vital signs: Reviewed and stable  Last Vitals:  Vitals Value Taken Time  BP 165/65   Temp    Pulse 67 12/29/22 1516  Resp 18 12/29/22 1516  SpO2 100 % 12/29/22 1516  Vitals shown include unfiled device data.  Last Pain: There were no vitals filed for this visit.       Complications: No notable events documented.

## 2022-12-29 NOTE — Progress Notes (Signed)
SLP Cancellation Note  Patient Details Name: Jocelyn [name] MRN: [id number] DOB: 12/11/1931   Cancelled treatment:       Reason Eval/Treat Not Completed: Patient not medically ready. Will continue following.    Gwynneth Aliment, M.A., CF-SLP Speech Language Pathology, Acute Rehabilitation Services  Secure Chat preferred [phone]  12/29/2022, 3:07 PM

## 2022-12-29 NOTE — Code Documentation (Signed)
Stroke Response Nurse Documentation Code Documentation  Jocelyn [name] is a 89 y.o. female arriving to [facility]  via [facility] on 12/29/2022 with past medical hx of arthritis bilater knees, colitis, HTN, GERD, iron deficiency anemia, HLD, Afib, CAD. On Eliquis (apixaban) daily. Code stroke was activated by EMS.   /Patient from home where she was LKW at 2300 12/28/2022 and now complaining of Left sided weakness, not following commands, right gaze preference. Per EMS, patients brother found her unresponsive and thought she was not breathing. Upon EMS arrival, she was moving her right side, attempting talk but confused, and had left sided weakness.  Stroke team at the bedside on patient arrival. Labs drawn and patient cleared for CT by Dr. Charm Barges. Patient to CT with team. NIHSS 19, see documentation for details and code stroke times. Patient with disoriented, not following commands, right gaze preference , left hemianopia, left facial droop, left arm weakness, bilateral leg weakness, left decreased sensation, dysarthria , and Visual  neglect on exam. The following imaging was completed:  CT Head, CTA, and CTP. Patient is not a candidate for IV Thrombolytic due to outside of window for TNK. Patient is a candidate for IR due to LVO noted on imaging per IR.   Patient transferred to IR  Bedside handoff with IR RN Beverly Gust  Stroke Response RN

## 2022-12-29 NOTE — ED Triage Notes (Signed)
Pt BIB EMS for code stroke. LSN was 2300 11/27. At 0930 family noticed right gaze and left sided weakness/ aphasia. Pt usually axox4.

## 2022-12-29 NOTE — Anesthesia Postprocedure Evaluation (Signed)
Anesthesia Post Note  Patient: Jocelyn [name]  Procedure(s) Performed: IR WITH ANESTHESIA     Patient location during evaluation: SICU Anesthesia Type: General Level of consciousness: sedated Pain management: pain level controlled Vital Signs Assessment: post-procedure vital signs reviewed and stable Respiratory status: patient remains intubated per anesthesia plan Cardiovascular status: stable Postop Assessment: no apparent nausea or vomiting Anesthetic complications: no  No notable events documented.  Last Vitals:  Vitals:   12/29/22 1503 12/29/22 1604  BP:    SpO2: 100% 100%    Last Pain: There were no vitals filed for this visit.               Kennieth Rad

## 2022-12-29 NOTE — Anesthesia Preprocedure Evaluation (Signed)
Anesthesia Evaluation  Patient identified by MRN, date of birth, ID band Patient unresponsive  Preop documentation limited or incomplete due to emergent nature of procedure.  Airway Mallampati: II  TM Distance: >3 FB Neck ROM: Full    Dental   Pulmonary sleep apnea    breath sounds clear to auscultation       Cardiovascular hypertension, + CAD   Rhythm:Irregular Rate:Tachycardia     Neuro/Psych CVA    GI/Hepatic Neg liver ROS,GERD  ,,  Endo/Other  negative endocrine ROS    Renal/GU negative Renal ROS     Musculoskeletal  (+) Arthritis ,    Abdominal   Peds  Hematology negative hematology ROS (+)   Anesthesia Other Findings   Reproductive/Obstetrics                             Anesthesia Physical Anesthesia Plan  ASA: 4 and emergent  Anesthesia Plan: General   Post-op Pain Management:    Induction: Intravenous and Rapid sequence  PONV Risk Score and Plan: 3 and Dexamethasone and Ondansetron  Airway Management Planned: Oral ETT  Additional Equipment: Arterial line  Intra-op Plan:   Post-operative Plan: Possible Post-op intubation/ventilation  Informed Consent: I have reviewed the patients History and Physical, chart, labs and discussed the procedure including the risks, benefits and alternatives for the proposed anesthesia with the patient or authorized representative who has indicated his/her understanding and acceptance.     Dental advisory given  Plan Discussed with: CRNA  Anesthesia Plan Comments:        Anesthesia Quick Evaluation

## 2022-12-29 NOTE — ED Provider Notes (Signed)
[place] EMERGENCY DEPARTMENT AT [facility] Provider Note   CSN: [id number] Arrival date & time: 12/29/22  1111     History  No chief complaint on file.   Jocelyn [name] is a 89 y.o. female.  She is brought in by ambulance for code stroke activation.  Last known well was 11 PM last night.  Was found by family less responsive.  She is not moving her left arm and seems to have a right gaze preference.  She was met at the entrance and had an evaluation with neurology.  Taken emergently to CT.  The history is provided by the EMS personnel.  Cerebrovascular Accident This is a new problem. The problem occurs constantly. The problem has not changed since onset.Nothing aggravates the symptoms. Nothing relieves the symptoms. She has tried nothing for the symptoms. The treatment provided no relief.       Home Medications Prior to Admission medications   Medication Sig Start Date End Date Taking? Authorizing Provider  albuterol (VENTOLIN HFA) 108 (90 Base) MCG/ACT inhaler Inhale 1 puff into the lungs as needed. 09/26/19   [provider]  alendronate (FOSAMAX) 70 MG tablet Take 70 mg by mouth once a week. 06/01/20   [provider]  apixaban (ELIQUIS) 5 MG TABS tablet TAKE 1 TABLET BY MOUTH TWICE A DAY 10/10/22   Furth, Cadence H, PA-C  calcium citrate-vitamin D 500-400 MG-UNIT chewable tablet Chew 1 tablet by mouth 2 (two) times daily.     [provider]  clotrimazole (LOTRIMIN) 1 % cream Apply 1 Application topically 2 (two) times daily. 11/03/22   [provider]  ferrous sulfate 325 (65 FE) MG tablet Take 1 tablet (325 mg total) by mouth daily. 12/03/18   Doren Custard, FNP  furosemide (LASIX) 40 MG tablet Take 1 tablet (40 mg total) by mouth daily. 12/07/22 03/07/23  Bensimhon, Bevelyn Buckles, MD  HYDROcodone-acetaminophen (NORCO/VICODIN) 5-325 MG tablet Take 1 tablet by mouth every 12 (twelve) hours as needed for severe pain (pain score 7-10). Must  last 30 days. 12/06/22 01/05/23  Edward Jolly, MD  HYDROcodone-acetaminophen (NORCO/VICODIN) 5-325 MG tablet Take 1 tablet by mouth every 12 (twelve) hours as needed for severe pain (pain score 7-10). Must last 30 days. 01/05/23 02/04/23  Edward Jolly, MD  isosorbide mononitrate (IMDUR) 120 MG 24 hr tablet TAKE 1 TABLET BY MOUTH EVERY DAY 07/26/22   End, Cristal Deer, MD  Olmesartan-amLODIPine-HCTZ 40-10-12.5 MG TABS Take 1 tablet by mouth daily. For blood pressure 11/22/18   Doren Custard, FNP  omeprazole (PRILOSEC) 20 MG capsule Take 20 mg by mouth daily. 02/19/19   [provider]  spironolactone (ALDACTONE) 25 MG tablet Take 0.5 tablets (12.5 mg total) by mouth daily. 12/07/22 03/07/23  Bensimhon, Bevelyn Buckles, MD      Allergies    Patient has no known allergies.    Review of Systems   Review of Systems  Unable to perform ROS: Mental status change    Physical Exam Updated Vital Signs BP (!) 164/68   Pulse 62   Resp 20   Ht 5\' 3"  (1.6 m)   Wt 68.6 kg   SpO2 100%   BMI 26.79 kg/m  Physical Exam Vitals and nursing note reviewed.  Constitutional:      General: She is not in acute distress.    Appearance: Normal appearance. She is well-developed.  HENT:     Head: Normocephalic and atraumatic.  Eyes:     Conjunctiva/sclera: Conjunctivae  normal.  Cardiovascular:     Rate and Rhythm: Normal rate and regular rhythm.     Heart sounds: No murmur heard. Pulmonary:     Effort: Pulmonary effort is normal. No respiratory distress.     Breath sounds: Normal breath sounds.  Abdominal:     Palpations: Abdomen is soft.     Tenderness: There is no abdominal tenderness. There is no guarding or rebound.  Musculoskeletal:        General: No swelling.     Cervical back: Neck supple.  Skin:    General: Skin is warm and dry.     Capillary Refill: Capillary refill takes less than 2 seconds.  Neurological:     Mental Status: She is alert.     Comments: No use of left arm.     ED Results  / Procedures / Treatments   Labs (all labs ordered are listed, but only abnormal results are displayed) Labs Reviewed  COMPREHENSIVE METABOLIC PANEL - Abnormal; Notable for the following components:      Result Value   Glucose, Bld 136 (*)    BUN 28 (*)    Total Bilirubin 1.4 (*)    All other components within normal limits  URINALYSIS, ROUTINE W REFLEX MICROSCOPIC - Abnormal; Notable for the following components:   Specific Gravity, Urine 1.032 (*)    Ketones, ur 5 (*)    All other components within normal limits  I-STAT CHEM 8, ED - Abnormal; Notable for the following components:   BUN 36 (*)    Glucose, Bld 139 (*)    Calcium, Ion 1.13 (*)    All other components within normal limits  CBG MONITORING, ED - Abnormal; Notable for the following components:   Glucose-Capillary 122 (*)    All other components within normal limits  POCT I-STAT 7, (LYTES, BLD GAS, ICA,H+H) - Abnormal; Notable for the following components:   pO2, Arterial 388 (*)    All other components within normal limits  MRSA NEXT GEN BY PCR, NASAL  ETHANOL  PROTIME-INR  APTT  RAPID URINE DRUG SCREEN, HOSP PERFORMED  CBC  DIFFERENTIAL  BLOOD GAS, ARTERIAL    EKG None  Radiology DG Abd Portable 1V  Result Date: 12/29/2022 CLINICAL DATA:  [id number] Encounter for orogastric (OG) tube placement [id number] EXAM: PORTABLE ABDOMEN - 1 VIEW COMPARISON:  CT abdomen pelvis 03/01/2018 FINDINGS: Enteric tube courses below the hemidiaphragm and then makes a hairpin turn with a course back cranially with tip overlying the retrocardiac region. Query finding due to patient positioning versus malpositioning of the tube versus hiatal hernia. The bowel gas pattern is normal. Previously administered intravenous contrast noted excreted by bilateral collecting systems. No radio-opaque calculi or other significant radiographic abnormality are seen. Severe atherosclerotic plaque and mitral annular calcifications. Please see chest x-ray for  further details regarding the visualized cardiomediastinal silhouette and lungs. Severe degenerative changes of the lumbar spine with associated levoscoliosis centered at the mid lumbar spine. IMPRESSION: 1. Enteric tube courses below the hemidiaphragm and then makes a hairpin turn with a course back cranially with tip overlying the retrocardiac region. Query appropriate position with finding due to patient positioning versus malpositioning of the tube versus hiatal hernia. Recommend retraction by 7 cm and repeating x-ray abdomen for further evaluation. 2. Nonobstructive bowel gas pattern. 3.  Aortic Atherosclerosis (ICD10-I70.0). Electronically Signed   By: Tish Frederickson M.D.   On: 12/29/2022 16:19   DG CHEST PORT 1 VIEW  Result Date: 12/29/2022 CLINICAL DATA:  Orogastric tube placement. Gastroesophageal reflux, patient was found unresponsive, right MCA thrombosis and stroke EXAM: PORTABLE CHEST 1 VIEW COMPARISON:  03/01/2022 FINDINGS: Endotracheal tube tip 3.8 cm above the carina. An orogastric tube extends into the stomach and appears to coil back with its tip near the gastric cardia. Dense mitral valve calcification. Mild cardiomegaly. Reverse lordotic projection. Atherosclerotic calcification of the aortic arch. No blunting of the costophrenic angles. IMPRESSION: 1. Endotracheal tube tip 3.8 cm above the carina. 2. Orogastric tube extends into the stomach and appears to coil back with its tip near the gastric cardia. 3. Mild cardiomegaly. 4. Dense mitral valve calcification. Electronically Signed   By: Gaylyn Rong M.D.   On: 12/29/2022 16:18   CT ANGIO HEAD NECK W WO CM W PERF (CODE STROKE)  Result Date: 12/29/2022 CLINICAL DATA:  Right-sided gaze and aphasia with left-sided weakness. EXAM: CT ANGIOGRAPHY HEAD AND NECK CT PERFUSION BRAIN TECHNIQUE: Multidetector CT imaging of the head and neck was performed using the standard protocol during bolus administration of intravenous contrast.  Multiplanar CT image reconstructions and MIPs were obtained to evaluate the vascular anatomy. Carotid stenosis measurements (when applicable) are obtained utilizing NASCET criteria, using the distal internal carotid diameter as the denominator. Multiphase CT imaging of the brain was performed following IV bolus contrast injection. Subsequent parametric perfusion maps were calculated using RAPID software. RADIATION DOSE REDUCTION: This exam was performed according to the departmental dose-optimization program which includes automated exposure control, adjustment of the mA and/or kV according to patient size and/or use of iterative reconstruction technique. CONTRAST:  OMNIPAQUE IOHEXOL 350 MG/ML SOLN COMPARISON:  Head CT from earlier today FINDINGS: CTA NECK FINDINGS Aortic arch: Atheromatous plaque with 3 vessel branching Right carotid system: Calcified plaque at the bifurcation. No flow reducing stenosis. No ulceration or beading. Left carotid system: Moderate calcified plaque centered at the bifurcation without stenosis, ulceration, or beading. Vertebral arteries: Proximal subclavian atherosclerosis. Bulky calcified plaque causes high-grade narrowing at the right vertebral origin. There is calcified plaque at the left vertebral origin which could cause moderate stenosis, quantification limited by the degree of calcified plaque blooming. Skeleton: No acute or aggressive finding. Other neck: No acute finding Upper chest: No acute finding Review of the MIP images confirms the above findings CTA HEAD FINDINGS Anterior circulation: Luminal clot within the right ICA terminus, nearly obstructive to the MCA and completely obstructive to the right A1 segment. There is a subsequent clot with occlusion at the proximal right M2 level. Atheromatous calcification of the cavernous carotids. No contralateral branch occlusion, beading, or aneurysm. Atheromatous irregularity of intracranial branches to a moderate degree.  Posterior circulation: Right dominant vertebral artery. The vertebral and basilar arteries are smoothly contoured and diffusely patent. No branch occlusion, beading, or aneurysm Venous sinuses: Negative Review of the MIP images confirms the above findings CT Brain Perfusion Findings: ASPECTS: 7 CBF (<30%) Volume: 6mL Perfusion (Tmax>6.0s) volume: Mismatch Volume: Infarction Location:Right frontal parietal white matter. Critical Value/emergent results were called by telephone at the time of interpretation on 12/29/2022 at 11:39 am to provider CORTNEY DE LA TORRE , who verbally acknowledged these results. IMPRESSION: CTA: 1. Emergent large vessel occlusion. Acute clot at the right ICA bifurcation and obstructing clot separately at proximal right M2 level. 2. No flow reducing stenosis or embolic source seen in the more proximal vasculature. 3. Bilateral vertebral origin stenosis, advanced on the right and at least moderate on the left. CT perfusion: 150 cc area of ischemia in the  right MCA territory. Underestimated core infarct when compared to aspects. Electronically Signed   By: Tiburcio Pea M.D.   On: 12/29/2022 11:48   CT HEAD CODE STROKE WO CONTRAST  Result Date: 12/29/2022 CLINICAL DATA:  Code stroke.  Left-sided weakness. EXAM: CT HEAD WITHOUT CONTRAST TECHNIQUE: Contiguous axial images were obtained from the base of the skull through the vertex without intravenous contrast. RADIATION DOSE REDUCTION: This exam was performed according to the departmental dose-optimization program which includes automated exposure control, adjustment of the mA and/or kV according to patient size and/or use of iterative reconstruction technique. COMPARISON:  Head CT 04/13/2011 FINDINGS: Brain: Cytotoxic edema appearance in the right insular cortex and striatum. No acute hemorrhage, hydrocephalus, or masslike finding. Cerebral volume loss and chronic small vessel ischemia in keeping with age. Vascular: Hyperdense  right MCA. Skull: Normal. Negative for fracture or focal lesion. Sinuses/Orbits: No acute finding Other: Critical Value/emergent results were called by telephone at the time of interpretation on 12/29/2022 at 11:38 am to provider Khaliqidina, who verbally acknowledged these results. ASPECTS [facility] Stroke Program Early CT Score) - Ganglionic level infarction (caudate, lentiform nuclei, internal capsule, insula, M1-M3 cortex): 4 - Supraganglionic infarction (M4-M6 cortex): 3 Total score (0-10 with 10 being normal): 7 IMPRESSION: 1. Cytotoxic edema in the right stratum and insula.ASPECTS is 7 2. Hyperdense right MCA. 3. No intracranial hemorrhage. Electronically Signed   By: Tiburcio Pea M.D.   On: 12/29/2022 11:39    Procedures .Critical Care  Performed by: Terrilee Files, MD Authorized by: Terrilee Files, MD   Critical care provider statement:    Critical care time (minutes):  45   Critical care time was exclusive of:  Separately billable procedures and treating other patients   Critical care was necessary to treat or prevent imminent or life-threatening deterioration of the following conditions:  CNS failure or compromise   Critical care was time spent personally by me on the following activities:  Development of treatment plan with patient or surrogate, discussions with consultants, evaluation of patient's response to treatment, examination of patient, obtaining history from patient or surrogate, ordering and performing treatments and interventions, ordering and review of laboratory studies, ordering and review of radiographic studies, pulse oximetry, re-evaluation of patient's condition and review of old charts   I assumed direction of critical care for this patient from another provider in my specialty: no       Medications Ordered in ED Medications  fentaNYL (SUBLIMAZE) injection 25 mcg (25 mcg Intravenous Not Given 12/29/22 1204)  sodium chloride 0.9 % bolus 250 mL (has no  administration in time range)  acetaminophen (TYLENOL) tablet 650 mg (has no administration in time range)    Or  acetaminophen (TYLENOL) 160 MG/5ML solution 650 mg (has no administration in time range)    Or  acetaminophen (TYLENOL) suppository 650 mg (has no administration in time range)  clevidipine (CLEVIPREX) infusion 0.5 mg/mL (5 mg/hr Intravenous Infusion Verify 12/29/22 1600)  0.9 %  sodium chloride infusion ( Intravenous Infusion Verify 12/29/22 1600)  ondansetron (ZOFRAN) injection 4 mg (has no administration in time range)   stroke: early stages of recovery book (has no administration in time range)  0.9 %  sodium chloride infusion ( Intravenous Not Given 12/29/22 1550)  sodium chloride 0.9 % bolus 250 mL (has no administration in time range)  propofol (DIPRIVAN) 1000 MG/100ML infusion (50 mcg/kg/min  68.6 kg Intravenous Infusion Verify 12/29/22 1600)  furosemide (LASIX) tablet 40 mg (has no administration in time  range)  spironolactone (ALDACTONE) tablet 12.5 mg (has no administration in time range)  irbesartan (AVAPRO) tablet 75 mg (has no administration in time range)  fentaNYL (SUBLIMAZE) injection 50 mcg (has no administration in time range)  famotidine (PEPCID) tablet 20 mg (has no administration in time range)  ipratropium-albuterol (DUONEB) 0.5-2.5 (3) MG/3ML nebulizer solution 3 mL (has no administration in time range)  Oral care mouth rinse (has no administration in time range)  Oral care mouth rinse (has no administration in time range)  Chlorhexidine Gluconate Cloth 2 % PADS 6 each (6 each Topical Given 12/29/22 1530)  Oral care mouth rinse (has no administration in time range)  Oral care mouth rinse (has no administration in time range)  fentaNYL (SUBLIMAZE) injection 25 mcg (25 mcg Intravenous Given 12/29/22 1121)  iohexol (OMNIPAQUE) 350 MG/ML injection 100 mL (100 mLs Intravenous Contrast Given 12/29/22 1136)  iohexol (OMNIPAQUE) 300 MG/ML solution 150 mL (100  mLs Intra-arterial Contrast Given 12/29/22 1517)    ED Course/ Medical Decision Making/ A&P Clinical Course as of 12/29/22 1644  Thu Dec 29, 2022  1138 Patient's Noncon CT does not show any acute bleed.  Her angio shows possible right M2.  Neurology is reviewing this with radiology [MB]    Clinical Course User Index [MB] Terrilee Files, MD                                 Medical Decision Making Amount and/or Complexity of Data Reviewed Labs: ordered. Radiology: ordered.  Risk Decision regarding hospitalization.   This patient complains of altered mental status gaze preference left arm weakness; this involves an extensive number of treatment Options and is a complaint that carries with it a high risk of complications and morbidity. The differential includes stroke, bleed, seizure, mass  I ordered, reviewed and interpreted labs, which included chemistries and LFTs fairly unremarkable, urinalysis without signs of infection I ordered imaging studies which included head CT and CT angio head and neck and I independently    visualized and interpreted imaging which showed acute LVO Additional history obtained from EMS Previous records obtained and reviewed in epic including recent cardiology and pain clinic notes I consulted neurology Dr. Derry Lory and discussed lab and imaging findings and discussed disposition.  Cardiac monitoring reviewed, sinus rhythm Social determinants considered, no significant barriers Critical Interventions: Rapid evaluation and initiation of treatment for patient with stroke and LVO  After the interventions stated above, I reevaluated the patient and found patient will need emergent intervention by interventional radiology Admission and further testing considered, patient will be admitted to the hospital         Final Clinical Impression(s) / ED Diagnoses Final diagnoses:  Acute ischemic stroke [facility])    Rx / DC Orders ED Discharge Orders      None         Terrilee Files, MD 12/29/22 1648

## 2022-12-30 ENCOUNTER — Other Ambulatory Visit: Payer: Self-pay

## 2022-12-30 ENCOUNTER — Inpatient Hospital Stay (HOSPITAL_COMMUNITY): Payer: 59

## 2022-12-30 ENCOUNTER — Encounter (HOSPITAL_COMMUNITY): Payer: Self-pay | Admitting: Radiology

## 2022-12-30 DIAGNOSIS — I10 Essential (primary) hypertension: Secondary | ICD-10-CM | POA: Diagnosis not present

## 2022-12-30 DIAGNOSIS — I63231 Cerebral infarction due to unspecified occlusion or stenosis of right carotid arteries: Secondary | ICD-10-CM | POA: Diagnosis not present

## 2022-12-30 DIAGNOSIS — I6389 Other cerebral infarction: Secondary | ICD-10-CM

## 2022-12-30 DIAGNOSIS — I639 Cerebral infarction, unspecified: Secondary | ICD-10-CM | POA: Diagnosis not present

## 2022-12-30 LAB — CBC
HCT: 35.4 % — ABNORMAL LOW (ref 36.0–46.0)
Hemoglobin: 11.5 g/dL — ABNORMAL LOW (ref 12.0–15.0)
MCH: 27.4 pg (ref 26.0–34.0)
MCHC: 32.5 g/dL (ref 30.0–36.0)
MCV: 84.3 fL (ref 80.0–100.0)
Platelets: 123 10*3/uL — ABNORMAL LOW (ref 150–400)
RBC: 4.2 MIL/uL (ref 3.87–5.11)
RDW: 14.2 % (ref 11.5–15.5)
WBC: 7 10*3/uL (ref 4.0–10.5)
nRBC: 0 % (ref 0.0–0.2)

## 2022-12-30 LAB — ECHOCARDIOGRAM COMPLETE
AR max vel: 2.24 cm2
AV Area VTI: 2.67 cm2
AV Area mean vel: 2.32 cm2
AV Mean grad: 3.7 mm[Hg]
AV Peak grad: 8.6 mm[Hg]
Ao pk vel: 1.47 m/s
Area-P 1/2: 4.21 cm2
Calc EF: 70.1 %
Height: 63 in
MV VTI: 2.15 cm2
S' Lateral: 2.7 cm
Single Plane A2C EF: 61.3 %
Single Plane A4C EF: 77.1 %
Weight: 2419.77 [oz_av]

## 2022-12-30 LAB — GLUCOSE, CAPILLARY
Glucose-Capillary: 115 mg/dL — ABNORMAL HIGH (ref 70–99)
Glucose-Capillary: 129 mg/dL — ABNORMAL HIGH (ref 70–99)

## 2022-12-30 LAB — MAGNESIUM: Magnesium: 1.8 mg/dL (ref 1.7–2.4)

## 2022-12-30 LAB — COMPREHENSIVE METABOLIC PANEL
ALT: 25 U/L (ref 0–44)
AST: 29 U/L (ref 15–41)
Albumin: 3.4 g/dL — ABNORMAL LOW (ref 3.5–5.0)
Alkaline Phosphatase: 31 U/L — ABNORMAL LOW (ref 38–126)
Anion gap: 8 (ref 5–15)
BUN: 19 mg/dL (ref 8–23)
CO2: 24 mmol/L (ref 22–32)
Calcium: 9 mg/dL (ref 8.9–10.3)
Chloride: 109 mmol/L (ref 98–111)
Creatinine, Ser: 0.76 mg/dL (ref 0.44–1.00)
GFR, Estimated: >60 mL/min (ref >60)
Glucose, Bld: 132 mg/dL — ABNORMAL HIGH (ref 70–99)
Potassium: 3.8 mmol/L (ref 3.5–5.1)
Sodium: 141 mmol/L (ref 135–145)
Total Bilirubin: 0.8 mg/dL (ref <1.2)
Total Protein: 6.4 g/dL — ABNORMAL LOW (ref 6.5–8.1)

## 2022-12-30 LAB — LIPID PANEL
Cholesterol: 105 mg/dL (ref 0–200)
HDL: 44 mg/dL (ref >40)
LDL Cholesterol: 33 mg/dL (ref 0–99)
Total CHOL/HDL Ratio: 2.4 {ratio}
Triglycerides: 142 mg/dL (ref <150)
VLDL: 28 mg/dL (ref 0–40)

## 2022-12-30 LAB — HEMOGLOBIN A1C
Hgb A1c MFr Bld: 6.4 % — ABNORMAL HIGH (ref 4.8–5.6)
Mean Plasma Glucose: 136.98 mg/dL

## 2022-12-30 LAB — PHOSPHORUS: Phosphorus: 4.1 mg/dL (ref 2.5–4.6)

## 2022-12-30 LAB — TRIGLYCERIDES: Triglycerides: 142 mg/dL (ref <150)

## 2022-12-30 MED ORDER — OSMOLITE 1.2 CAL PO LIQD
1000.0000 mL | ORAL | Status: DC
  Administered 2022-12-30 – 2023-01-08 (×10): 1000 mL

## 2022-12-30 MED ORDER — IRBESARTAN 150 MG PO TABS
150.0000 mg | ORAL_TABLET | Freq: Every day | ORAL | Status: DC
  Administered 2022-12-31 – 2023-01-11 (×12): 150 mg
  Filled 2022-12-30 (×12): qty 1

## 2022-12-30 MED ORDER — IRBESARTAN 150 MG PO TABS
75.0000 mg | ORAL_TABLET | Freq: Every day | ORAL | Status: AC
  Administered 2022-12-30: 75 mg
  Filled 2022-12-30: qty 1

## 2022-12-30 MED ORDER — ASPIRIN 325 MG PO TABS
325.0000 mg | ORAL_TABLET | Freq: Every day | ORAL | Status: DC
  Administered 2022-12-30 – 2023-01-01 (×3): 325 mg
  Filled 2022-12-30 (×4): qty 1

## 2022-12-30 MED ORDER — POTASSIUM CHLORIDE 20 MEQ PO PACK
40.0000 meq | PACK | Freq: Once | ORAL | Status: AC
  Administered 2022-12-30: 40 meq
  Filled 2022-12-30: qty 2

## 2022-12-30 MED ORDER — ORAL CARE MOUTH RINSE
15.0000 mL | OROMUCOSAL | Status: DC
  Administered 2022-12-30 – 2023-01-11 (×46): 15 mL via OROMUCOSAL

## 2022-12-30 MED ORDER — ORAL CARE MOUTH RINSE: 15.0000 mL | OROMUCOSAL | Status: DC | PRN

## 2022-12-30 MED ORDER — PIVOT 1.5 CAL PO LIQD: 1000.0000 mL | ORAL | Status: DC

## 2022-12-30 MED ORDER — VITAL HIGH PROTEIN PO LIQD: 1000.0000 mL | ORAL | Status: DC

## 2022-12-30 MED ORDER — PROSOURCE TF20 ENFIT COMPATIBL EN LIQD: 60.0000 mL | Freq: Every day | ENTERAL | Status: DC

## 2022-12-30 MED ORDER — ATORVASTATIN CALCIUM 40 MG PO TABS
40.0000 mg | ORAL_TABLET | Freq: Every day | ORAL | Status: DC
  Administered 2022-12-30 – 2023-01-11 (×13): 40 mg
  Filled 2022-12-30 (×13): qty 1

## 2022-12-30 NOTE — Progress Notes (Signed)
NAME:  Jocelyn [name], MRN:  [id number], DOB:  01-13-1932, LOS: 1 ADMISSION DATE:  12/29/2022, CONSULTATION DATE:  12/30/22 REFERRING MD:  Neuro, CHIEF COMPLAINT:  stroke symptoms   History of Present Illness:  89 y.o. woman history of hypertension diastolic congestive heart failure last known normal around 11 PM 11/27 with acute left-sided weakness found to have right MCA occlusion status post revascularization, complicated arterial cannulation requiring multiple sticks and multiple sticks ultimately cannulated via the carotid artery remains intubated after procedure.  On the vent.  Sedated.  Appears to still be somewhat paralyzed.  Blood pressure is elevated.  On propofol.  Adding clevidipine.  Reordering home antihypertensives, ARB at lower dose.  Reportedly she is fully independent at baseline.  History of atrial fibrillation on Eliquis.  Will hold given multiple arterial cannulation's.  Not a TNK candidate because of this.  Pertinent  Medical History  A-fib RVR on Eliquis, HTN  Significant Hospital Events: Including procedures, antibiotic start and stop dates in addition to other pertinent events   11/28 admitted with stroke symptoms right MCA occlusion status post revascularization with complicated vascular access requiring carotid cannulation  Interim History / Subjective:  No acute events overnight.  Remains on clevidipine.  Deeply sedated.  Plan to wean sedation and work towards extubation today.  Will need ongoing aggressive blood pressure control for 24 hours and can liberate to SBP goal 180.  Objective   Blood pressure (!) 131/54, pulse 67, temperature 97.8 F (36.6 C), temperature source Axillary, resp. rate (!) 24, height 5\' 3"  (1.6 m), weight 68.6 kg, SpO2 100%.    Vent Mode: CPAP;PSV FiO2 (%):  [40 %-100 %] 40 % Set Rate:  [18 bmp] 18 bmp Vt Set:  [420 mL] 420 mL PEEP:  [5 cmH20] 5 cmH20 Pressure Support:  [5 cmH20] 5 cmH20 Plateau Pressure:  [9 cmH20-13 cmH20] 11  cmH20   Intake/Output Summary (Last 24 hours) at 12/30/2022 0943 Last data filed at 12/30/2022 0800 Gross per 24 hour  Intake 2975.45 ml  Output 1055 ml  Net 1920.45 ml   Filed Weights   12/29/22 1100  Weight: 68.6 kg    Examination: General: Elderly, lying in bed Lungs: Clear, ventilated breath sounds Cardiovascular: Regular rate and rhythm, no murmur appreciated Abdomen: Nondistended, bowel sounds present Extremities: Edema Neuro: Deeply sedated, still paralyzed from OR  Resolved Hospital Problem list     Assessment & Plan:  Ventilator dependence after procedure: Given direct carotid access while on history of anticoagulation recommend strict bedrest with minimal movement for the next several hours. -- PRVC -- Wean sedation, SAT with SBT, currently on pressure support tolerating well, plan for extubation once less sedated  Right MCA occlusion: Status post mechanical thrombectomy -- SBP 120-160 for 24 hours and calibrate to SBP goal of 180 later in the afternoon 11/29 -- Clevidipine given elevated blood pressures  H/o HTN: Meds not totally clear - on lasix and spironolactone 12.5 as well as reported "ARB" per most recent cardiology note. Previous prescribed combination pill amlodipine hydrochlorothiazide olmesartan. -- Continue lasix and spiro,  increase ARB (at 1/2 reported old home dose - equivalent would be 300 mg daily), consider increasing irbesartan dose, likely will need to as propofol decreases suspect blood pressure will increase  Afib: -- Rate controlled currently -- Hold eliquis with stroke and carotid access  Best Practice (right click and "Reselect all SmartList Selections" daily)   Per primary  Labs   CBC: Recent Labs  Lab 12/29/22 1121  12/29/22 1602 12/29/22 1706 12/30/22 0441  WBC  --   --  4.7 7.0  NEUTROABS  --   --  3.3  --   HGB 14.3 12.2 12.0 11.5*  HCT 42.0 36.0 35.8* 35.4*  MCV  --   --  82.9 84.3  PLT  --   --  144* 123*    Basic  Metabolic Panel: Recent Labs  Lab 12/29/22 1113 12/29/22 1121 12/29/22 1602 12/30/22 0441  NA 140 143 141 141  K 3.7 3.8 3.5 3.8  CL 105 104  --  109  CO2 23  --   --  24  GLUCOSE 136* 139*  --  132*  BUN 28* 36*  --  19  CREATININE 0.81 0.90  --  0.76  CALCIUM 9.7  --   --  9.0   GFR: Estimated Creatinine Clearance: 44.3 mL/min (by C-G formula based on SCr of 0.76 mg/dL). Recent Labs  Lab 12/29/22 1706 12/30/22 0441  WBC 4.7 7.0    Liver Function Tests: Recent Labs  Lab 12/29/22 1113 12/30/22 0441  AST 37 29  ALT 29 25  ALKPHOS 41 31*  BILITOT 1.4* 0.8  PROT 7.6 6.4*  ALBUMIN 3.9 3.4*   No results for input(s): "LIPASE", "AMYLASE" in the last 168 hours. No results for input(s): "AMMONIA" in the last 168 hours.  ABG    Component Value Date/Time   PHART 7.367 12/29/2022 1602   PCO2ART 45.2 12/29/2022 1602   PO2ART 388 (H) 12/29/2022 1602   HCO3 26.0 12/29/2022 1602   TCO2 27 12/29/2022 1602   O2SAT 100 12/29/2022 1602     Coagulation Profile: Recent Labs  Lab 12/29/22 1140  INR 1.1    Cardiac Enzymes: No results for input(s): "CKTOTAL", "CKMB", "CKMBINDEX", "TROPONINI" in the last 168 hours.  HbA1C: Hgb A1c MFr Bld  Date/Time Value Ref Range Status  12/30/2022 04:41 AM 6.4 (H) 4.8 - 5.6 % Final    Comment:    (NOTE) Pre diabetes:          5.7%-6.4%  Diabetes:              >6.4%  Glycemic control for   <7.0% adults with diabetes   12/08/2017 02:58 PM 6.1 (H) <5.7 % of total Hgb Final    Comment:    For someone without known diabetes, a hemoglobin  A1c value between 5.7% and 6.4% is consistent with prediabetes and should be confirmed with a  follow-up test. . For someone with known diabetes, a value <7% indicates that their diabetes is well controlled. A1c targets should be individualized based on duration of diabetes, age, comorbid conditions, and other considerations. . This assay result is consistent with an increased risk of  diabetes. . Currently, no consensus exists regarding use of hemoglobin A1c for diagnosis of diabetes for children. .     CBG: Recent Labs  Lab 12/29/22 1113  GLUCAP 122*    Review of Systems:   Unobtainable due to patient intubated and sedated  Past Medical History:  She,  has a past medical history of Arthritis of both knees (08/01/2015), Colitis, Essential hypertension, benign (07/28/2014), GERD (gastroesophageal reflux disease), iron deficiency anemia (09/02/2015), Hyperlipidemia, Hypertension, Lumbar pain, Medicare annual wellness visit, subsequent (04/07/2017), OSA on CPAP (08/01/2015), Overweight (BMI 25.0-29.9) (07/10/2013), Prediabetes (09/02/2015), and SOB (shortness of breath) (07/10/2013).   Surgical History:   Past Surgical History:  Procedure Laterality Date   ABDOMINAL HYSTERECTOMY     CATARACT EXTRACTION  CHOLECYSTECTOMY     COLONOSCOPY WITH PROPOFOL N/A 04/09/2018   Procedure: COLONOSCOPY WITH PROPOFOL;  Surgeon: Toney Reil, MD;  Location: [facility] ENDOSCOPY;  Service: Gastroenterology;  Laterality: N/A;   LUMBAR DISC SURGERY     RADIOLOGY WITH ANESTHESIA N/A 12/29/2022   Procedure: IR WITH ANESTHESIA;  Surgeon: Radiologist, Medication, MD;  Location: [facility] OR;  Service: Radiology;  Laterality: N/A;     Social History:   reports that she has never smoked. She has never used smokeless tobacco. She reports that she does not drink alcohol and does not use drugs.   Family History:  Her family history includes Anuerysm in her son; Cancer in her daughter and father; Diabetes in her daughter and mother; Heart disease in her mother; Hypertension in her daughter. There is no history of Breast cancer.   Allergies No Known Allergies   Home Medications  Prior to Admission medications   Medication Sig Start Date End Date Taking? Authorizing Provider  albuterol (VENTOLIN HFA) 108 (90 Base) MCG/ACT inhaler Inhale 1 puff into the lungs as needed. 09/26/19   [provider]  alendronate (FOSAMAX) 70 MG tablet Take 70 mg by mouth once a week. 06/01/20   [provider]  apixaban (ELIQUIS) 5 MG TABS tablet TAKE 1 TABLET BY MOUTH TWICE A DAY 10/10/22   Furth, Cadence H, PA-C  calcium citrate-vitamin D 500-400 MG-UNIT chewable tablet Chew 1 tablet by mouth 2 (two) times daily.     [provider]  clotrimazole (LOTRIMIN) 1 % cream Apply 1 Application topically 2 (two) times daily. 11/03/22   [provider]  ferrous sulfate 325 (65 FE) MG tablet Take 1 tablet (325 mg total) by mouth daily. 12/03/18   Doren Custard, FNP  furosemide (LASIX) 40 MG tablet Take 1 tablet (40 mg total) by mouth daily. 12/07/22 03/07/23  Bensimhon, Bevelyn Buckles, MD  HYDROcodone-acetaminophen (NORCO/VICODIN) 5-325 MG tablet Take 1 tablet by mouth every 12 (twelve) hours as needed for severe pain (pain score 7-10). Must last 30 days. 12/06/22 01/05/23  Edward Jolly, MD  HYDROcodone-acetaminophen (NORCO/VICODIN) 5-325 MG tablet Take 1 tablet by mouth every 12 (twelve) hours as needed for severe pain (pain score 7-10). Must last 30 days. 01/05/23 02/04/23  Edward Jolly, MD  isosorbide mononitrate (IMDUR) 120 MG 24 hr tablet TAKE 1 TABLET BY MOUTH EVERY DAY 07/26/22   End, Cristal Deer, MD  Olmesartan-amLODIPine-HCTZ 40-10-12.5 MG TABS Take 1 tablet by mouth daily. For blood pressure 11/22/18   Doren Custard, FNP  omeprazole (PRILOSEC) 20 MG capsule Take 20 mg by mouth daily. 02/19/19   [provider]  spironolactone (ALDACTONE) 25 MG tablet Take 0.5 tablets (12.5 mg total) by mouth daily. 12/07/22 03/07/23  Bensimhon, Bevelyn Buckles, MD     Critical care time:     CRITICAL CARE Performed by: Karren Burly   Total critical care time: 31 minutes  Critical care time was exclusive of separately billable procedures and treating other patients.  Critical care was necessary to treat or prevent imminent or life-threatening deterioration.  Critical care was time spent personally  by me on the following activities: development of treatment plan with patient and/or surrogate as well as nursing, discussions with consultants, evaluation of patient's response to treatment, examination of patient, obtaining history from patient or surrogate, ordering and performing treatments and interventions, ordering and review of laboratory studies, ordering and review of radiographic studies, pulse oximetry and re-evaluation of patient's condition.  Karren Burly, MD See  AMION

## 2022-12-30 NOTE — Progress Notes (Addendum)
Referring Provider(s): Erick Blinks, MD   Supervising Physician: Baldemar Lenis  Patient Status:  [facility] - In-pt  Chief Complaint:  Code stroke = s/p mechanical thrombectomy.  Brief History:  Jocelyn [name] is a 89 y.o. female with medical issues including GERD, prediabetes, permanent Afib on Eliquis, HTN, HLD, and OSA.  She presented to the hospital yesterday after her brother found her unresponsive.  Upon arrival of EMS, she was moving her right side and attempting to talk, although she was confused.    She was noted to have left-sided weakness, and code stroke was activated.    CTA showed= Emergent large vessel occlusion. Acute clot at the right ICA bifurcation and obstructing clot separately at proximal right M2 level.   Code Stroke called.  She underwent diagnostic cerebral angiography with mechanical thrombectomy yesterday by Dr. Tommie Sams.   Subjective:  She remains intubated. Family members and RN at bedside. RN states she has turned off sedation and hopes to extubate this morning. She is still on Cleviprex.   Allergies: Patient has no known allergies.  Medications: Prior to Admission medications   Medication Sig Start Date End Date Taking? Authorizing Provider  atorvastatin (LIPITOR) 40 MG tablet Take 40 mg by mouth daily.   Yes [provider]  albuterol (VENTOLIN HFA) 108 (90 Base) MCG/ACT inhaler Inhale 1 puff into the lungs as needed. 09/26/19   [provider]  alendronate (FOSAMAX) 70 MG tablet Take 70 mg by mouth once a week. 06/01/20   [provider]  apixaban (ELIQUIS) 5 MG TABS tablet TAKE 1 TABLET BY MOUTH TWICE A DAY 10/10/22   Furth, Cadence H, PA-C  calcium citrate-vitamin D 500-400 MG-UNIT chewable tablet Chew 1 tablet by mouth 2 (two) times daily.     [provider]  clotrimazole (LOTRIMIN) 1 % cream Apply 1 Application topically 2 (two) times daily. 11/03/22   [provider]  ferrous sulfate 325 (65 FE) MG tablet Take 1 tablet (325 mg total) by mouth daily. 12/03/18   Doren Custard, FNP  furosemide (LASIX) 40 MG tablet Take 1 tablet (40 mg total) by mouth daily. 12/07/22 03/07/23  Bensimhon, Bevelyn Buckles, MD  HYDROcodone-acetaminophen (NORCO/VICODIN) 5-325 MG tablet Take 1 tablet by mouth every 12 (twelve) hours as needed for severe pain (pain score 7-10). Must last 30 days. 12/06/22 01/05/23  Edward Jolly, MD  HYDROcodone-acetaminophen (NORCO/VICODIN) 5-325 MG tablet Take 1 tablet by mouth every 12 (twelve) hours as needed for severe pain (pain score 7-10). Must last 30 days. 01/05/23 02/04/23  Edward Jolly, MD  isosorbide mononitrate (IMDUR) 120 MG 24 hr tablet TAKE 1 TABLET BY MOUTH EVERY DAY 07/26/22   End, Cristal Deer, MD  Olmesartan-amLODIPine-HCTZ 40-10-12.5 MG TABS Take 1 tablet by mouth daily. For blood pressure 11/22/18   Doren Custard, FNP  omeprazole (PRILOSEC) 20 MG capsule Take 20 mg by mouth daily. 02/19/19   [provider]  spironolactone (ALDACTONE) 25 MG tablet Take 0.5 tablets (12.5 mg total) by mouth daily. 12/07/22 03/07/23  Bensimhon, Bevelyn Buckles, MD     Vital Signs: BP (!) 156/52   Pulse 82   Temp 97.8 F (36.6 C) (Axillary)   Resp (!) 21   Ht 5\' 3"  (1.6 m)   Wt 151 lb 3.8 oz (68.6 kg)   SpO2 100%   BMI 26.79 kg/m   Physical Exam Intubated/vent - sedation turned off but patient not awake/not following commands. PERRL bilaterally. Not moving left  arm per RN. Common femoral artery puncture site, right carotid artery puncture site and right radial artery site all look good, no bleeding, no hematoma, no pseudoaneurysm Distal pulses dopplerable on the right, difficult to find on the left (patient likely has existing PAD). + Radial and carotid pulses.  Labs:  CBC: Recent Labs    08/09/22 1151 09/19/22 0916 12/29/22 1121 12/29/22 1602 12/29/22 1706 12/30/22 0441  WBC 4.7 4.2  --   --  4.7 7.0  HGB 10.3* 12.5 14.3 12.2  12.0 11.5*  HCT 31.9* 39.6 42.0 36.0 35.8* 35.4*  PLT 144* 169  --   --  144* 123*    COAGS: Recent Labs    12/29/22 1140  INR 1.1  APTT 27    BMP: Recent Labs    08/09/22 1151 09/05/22 1041 12/07/22 1535 12/29/22 1113 12/29/22 1121 12/29/22 1602 12/30/22 0441  NA 138 138 143 140 143 141 141  K 3.3* 3.7 4.2 3.7 3.8 3.5 3.8  CL 103 107 105 105 104  --  109  CO2 26 22 26 23   --   --  24  GLUCOSE 100* 95 91 136* 139*  --  132*  BUN 16 17 16  28* 36*  --  19  CALCIUM 8.8* 9.4 10.0 9.7  --   --  9.0  CREATININE 0.73 0.73 0.77 0.81 0.90  --  0.76  GFRNONAA >60 >60  --  >60  --   --  >60    LIVER FUNCTION TESTS: Recent Labs    09/05/22 1041 12/07/22 1535 12/29/22 1113 12/30/22 0441  BILITOT 1.0 0.8 1.4* 0.8  AST 31 33 37 29  ALT 40 26 29 25   ALKPHOS 40 49 41 31*  PROT 6.6 7.6 7.6 6.4*  ALBUMIN 3.8 4.4 3.9 3.4*    Assessment and Plan:  Emergent large vessel occlusion. Acute clot at the right A1/ACA extending into the ICA terminus and obstructing clot at proximal right M2 level.   S/P diagnostic cerebral angiography with mechanical thrombectomy yesterday by Dr. Tommie Sams.  Plans for extubation today. Hopeful she has some meaningful recovery.  Care per Neurology. NIR will sign off.  Electronically Signed: Gwynneth Macleod, PA-C 12/30/2022, 11:55 AM    I spent a total of 25 Minutes at the the patient's bedside AND on the patient's hospital floor or unit, greater than 50% of which was counseling/coordinating care for f/u cerebral intervention.

## 2022-12-30 NOTE — Progress Notes (Signed)
Pt transported from 4N29 to MRI and back without complications.

## 2022-12-30 NOTE — Progress Notes (Signed)
  Echocardiogram 2D Echocardiogram has been performed.  Ocie Doyne RDCS 12/30/2022, 8:39 AM

## 2022-12-30 NOTE — Progress Notes (Signed)
Initial Nutrition Assessment  DOCUMENTATION CODES:   Not applicable  INTERVENTION:   Osmolite 1.2@55ml /hr- Initiate at 43ml/hr and increase by 75ml/hr q 8 hours until goal rate is each.   Free water flushes 30ml q4 hours to maintain tube patency   Regimen provides 1584kcal/day, 73g/day protein and 1224ml/day of free water.   Pt at high refeed risk; recommend monitor potassium, magnesium and phosphorus labs daily until stable  Daily weights    NUTRITION DIAGNOSIS:   Inadequate oral intake related to acute illness as evidenced by NPO status.  GOAL:   Patient will meet greater than or equal to 90% of their needs  MONITOR:   Labs, Weight trends, TF tolerance, I & O's, Skin  REASON FOR ASSESSMENT:   Consult Enteral/tube feeding initiation and management  ASSESSMENT:   89 y/o female with h/o HTN, HLD, DDD, OSA, PAF, IDA, chronic pain, CAD and GERD who is admitted with right ACA and right MCA occlusions s/p mechanical thrombectomy 11/28.  Pt s/p cortrak placement today  Pt seen while still intubated and ventilated. Pt is now extubated. Cortrak tube in place; KUB pending. Will plan to initiate tube feeds today. SLP evaluation once pt is more awake.   Per chart, pt appears fairly weight stable at baseline.   Medications reviewed and include: aspirin, pepcid, lasix, KCl, aldactone  Labs reviewed: K 3.8 wnl Cbgs- 122 AIC 6.4(H)- 11/29  NUTRITION - FOCUSED PHYSICAL EXAM:  Flowsheet Row Most Recent Value  Orbital Region No depletion  Upper Arm Region No depletion  Thoracic and Lumbar Region No depletion  Buccal Region Unable to assess  Temple Region Mild depletion  Clavicle Bone Region Mild depletion  Clavicle and Acromion Bone Region Mild depletion  Scapular Bone Region Mild depletion  Dorsal Hand Unable to assess  Patellar Region Moderate depletion  Anterior Thigh Region Moderate depletion  Posterior Calf Region Mild depletion  Edema (RD Assessment) None    Diet Order:   Diet Order             Diet NPO time specified  Diet effective now                  EDUCATION NEEDS:   No education needs have been identified at this time  Skin:  Skin Assessment: Reviewed RN Assessment (incision)  Last BM:  pta  Height:   Ht Readings from Last 1 Encounters:  12/29/22 5\' 3"  (1.6 m)    Weight:   Wt Readings from Last 1 Encounters:  12/29/22 68.6 kg    Ideal Body Weight:  52.2 kg  BMI:  Body mass index is 26.79 kg/m.  Estimated Nutritional Needs:   Kcal:  1400-1600kcal/day  Protein:  70-80g/day  Fluid:  1.4-1.6L/day  Betsey Holiday MS, RD, LDN Please refer to [facility] for RD and/or RD on-call/weekend/after hours pager

## 2022-12-30 NOTE — H&P (Signed)
Transition of Care [facility]) - Inpatient Brief Assessment   Patient Details  Name: Jocelyn [name] MRN: [id number] Date of Birth: [birth year]  [facility] CM/SW Contact:    Glennon Mac, RN Phone Number: 12/30/2022, 4:17 PM   Clinical Narrative: Patient admitted on 12/29/2022 after being found down with weakness on the Left and confusion noted.  Patient lives independently prior to admission; has supportive family. Currently remains sedated and intubated; TOC will continue to follow as patient progresses.    Transition of Care Asessment: Insurance and Status: Insurance coverage has been reviewed Patient has primary care physician: Yes (Dr. Hoy Morn) Home environment has been reviewed: Lives alone; supportive daughter Prior level of function:: Independent Prior/Current Home Services: No current home services Social Determinants of Health Reivew: SDOH reviewed no interventions necessary Readmission risk has been reviewed: Yes Transition of care needs: transition of care needs identified, TOC will continue to follow  [name], RN, BSN  Trauma/Neuro ICU Case Manager [phone]

## 2022-12-30 NOTE — Evaluation (Signed)
Occupational Therapy Evaluation Patient Details Name: Jocelyn [name] MRN: [id number] DOB: [birth year] Today's Date: 12/30/2022   History of Present Illness 89 yo female on 11/27 L sided weakness and R MCA occlusion intubated 11/28 IR revascularization complicated arterial cannulation requiring multiple sticks PMH HTN CHF AFIB RVR on eliquis HLD OSA   Clinical Impression   PT admitted with R MCA occlusion with revascularization. Pt currently with functional limitiations due to the deficits listed below (see OT problem list). Pt living indep prior to admission. Pt at this time does not follow commands or visually attend to therapist. Recommendation for L UE hand splint.  Pt will benefit from skilled OT to increase their independence and safety with adls and balance to allow discharge skilled inpatient follow up therapy, <3 hours/day. .        If plan is discharge home, recommend the following: Two people to help with bathing/dressing/bathroom    Functional Status Assessment  Patient has had a recent decline in their functional status and demonstrates the ability to make significant improvements in function in a reasonable and predictable amount of time.  Equipment Recommendations  Wheelchair (measurements OT);Wheelchair cushion (measurements OT);Hospital bed;Hoyer lift    Recommendations for Other Services Speech consult     Precautions / Restrictions Precautions Precautions: Fall Precaution Comments: watch O2 and BP, limit cervical rotation, bil mittens Restrictions Weight Bearing Restrictions: No      Mobility Bed Mobility Overal bed mobility: Needs Assistance             General bed mobility comments: bed in chair position due to BP at parameters so using bed to help with arousal and no changes    Transfers                   General transfer comment: defer      Balance                                           ADL either performed or  assessed with clinical judgement   ADL Overall ADL's : Needs assistance/impaired                                       General ADL Comments: total (A) for all aspects     Vision   Additional Comments: eyes R deviation does not open adn sustain eyes. pt opens to clapping and then closes     Perception Perception: Impaired Preception Impairment Details: Inattention/Neglect     Praxis         Pertinent Vitals/Pain       Extremity/Trunk Assessment Upper Extremity Assessment Upper Extremity Assessment: Right hand dominant;RUE deficits/detail;LUE deficits/detail RUE Deficits / Details: moving trying to remove the glove and once removed not moving hand LUE Deficits / Details: flaccid, noted to have abnormal wrist alignment with edema LUE Sensation: decreased light touch;decreased proprioception LUE Coordination: decreased fine motor;decreased gross motor   Lower Extremity Assessment Lower Extremity Assessment: Defer to PT evaluation   Cervical / Trunk Assessment Cervical / Trunk Assessment: Kyphotic   Communication Communication Communication: Difficulty communicating thoughts/reduced clarity of speech;Difficulty following commands/understanding Following commands:  (not following any commands)   Cognition Arousal: Obtunded Behavior During Therapy: Flat affect Overall Cognitive Status: Difficult to assess  General Comments  [place] 3.5 L extubuated around 13:50pm    Exercises Exercises: Other exercises Other Exercises Other Exercises: PROM of bil UE shoulder elbow wrist and digits. edema noted bil with L UE   Shoulder Instructions      Home Living Family/patient expects to be discharged to:: Private residence                                 Additional Comments: no family present at this time and from notes appears to be from private resident community. SW notes reports lives indep       Prior Functioning/Environment                 ADLs Comments: unknown at this time        OT Problem List: Decreased strength;Decreased activity tolerance;Impaired balance (sitting and/or standing);Decreased range of motion;Impaired vision/perception;Decreased coordination;Decreased cognition;Decreased safety awareness;Decreased knowledge of use of DME or AE;Decreased knowledge of precautions;Cardiopulmonary status limiting activity;Impaired sensation;Impaired tone;Impaired UE functional use;Obesity      OT Treatment/Interventions: Self-care/ADL training;Therapeutic exercise;Energy conservation;DME and/or AE instruction;Manual therapy;Modalities;Splinting;Therapeutic activities;Cognitive remediation/compensation;Visual/perceptual remediation/compensation;Patient/family education;Balance training;Neuromuscular education    OT Goals(Current goals can be found in the care plan section) Acute Rehab OT Goals Patient Stated Goal: none OT Goal Formulation: Patient unable to participate in goal setting Time For Goal Achievement: 01/13/23 Potential to Achieve Goals: Fair  OT Frequency: Min 1X/week    Co-evaluation PT/OT/SLP Co-Evaluation/Treatment: Yes Reason for Co-Treatment: Necessary to address cognition/behavior during functional activity;Complexity of the patient's impairments (multi-system involvement);For patient/therapist safety;To address functional/ADL transfers   OT goals addressed during session: ADL's and self-care      AM-PAC OT "6 Clicks" Daily Activity     Outcome Measure Help from another person eating meals?: Total Help from another person taking care of personal grooming?: Total Help from another person toileting, which includes using toliet, bedpan, or urinal?: Total Help from another person bathing (including washing, rinsing, drying)?: Total Help from another person to put on and taking off regular upper body clothing?: Total Help from another person to put  on and taking off regular lower body clothing?: Total 6 Click Score: 6   End of Session Equipment Utilized During Treatment: Oxygen Nurse Communication: Mobility status;Precautions  Activity Tolerance: Patient tolerated treatment well Patient left: in bed;with call bell/phone within reach;with chair alarm set  OT Visit Diagnosis: Unsteadiness on feet (R26.81);Muscle weakness (generalized) (M62.81)                Time: 1610-9604 OT Time Calculation (min): 18 min Charges:  OT General Charges $OT Visit: 1 Visit OT Evaluation $OT Eval Moderate Complexity: 1 Mod   Brynn, OTR/L  Acute Rehabilitation Services Office: [phone] .   Mateo Flow 12/30/2022, 4:31 PM

## 2022-12-30 NOTE — Progress Notes (Addendum)
STROKE TEAM PROGRESS NOTE   BRIEF HPI [name] is a 89 y.o. female with history of  GERD, prediabetes, permanent afibb on eliquis, HTN, HLD, OSA, also has a history of atrial fibrillation on Eliquis and appears to have been compliant with her medications.  She presented today after her brother found her unresponsive and thought that she was not breathing.  However, upon arrival of EMS, she was moving her right side and attempting to talk, although she was confused.  She was noted to have left-sided weakness, and code stroke was activated.   CT shows no acute abnormalities, acute clot at the right ICA Bifurcation and acute obstructing right MCA M2 clot was seen on CT angiogram with large penumbra on CT perfusion.  NIH on Admission 19   SIGNIFICANT HOSPITAL EVENTS 11/28.  Admitted.  CT shows no acute abnormalities, acute clot at the right ICA Bifurcation and acute obstructing right MCA M2 clot was seen on CTA with large penumbra on CT perfusion.Underwent medical thrombectomy of right MCA occlusion  INTERIM HISTORY/SUBJECTIVE  No family at the bedside. And is intubated on 10 mcg of propofol and 10 mg of Cleviprex IV.  She is weaning MRI brain with scattered acute right MCA distribution infarcts involving the right caudate and lentiform nuclei, right insula, and overlying right cerebral hemisphere, small volume petechial hemorrhage at the posterior right frontal region  MRA brain with interval revascularization of previously seen clot at the right ICA terminus and right M2 segment, Focal moderate distal right P2 stenosis, 3-4 mm outpouching arising from the supraclinoid left ICA, suspicious for a left PCOM aneurysm.  OBJECTIVE  CBC    Component Value Date/Time   WBC 7.0 12/30/2022 0441   RBC 4.20 12/30/2022 0441   HGB 11.5 (L) 12/30/2022 0441   HGB 12.5 09/19/2022 0916   HCT 35.4 (L) 12/30/2022 0441   HCT 39.6 09/19/2022 0916   PLT 123 (L) 12/30/2022 0441   PLT 169  09/19/2022 0916   MCV 84.3 12/30/2022 0441   MCV 87 09/19/2022 0916   MCV 82 04/13/2011 1125   MCH 27.4 12/30/2022 0441   MCHC 32.5 12/30/2022 0441   RDW 14.2 12/30/2022 0441   RDW 12.9 09/19/2022 0916   RDW 14.4 04/13/2011 1125   LYMPHSABS 1.1 12/29/2022 1706   LYMPHSABS 3.7 (H) 07/08/2015 1136   MONOABS 0.2 12/29/2022 1706   EOSABS 0.0 12/29/2022 1706   EOSABS 0.2 07/08/2015 1136   BASOSABS 0.0 12/29/2022 1706   BASOSABS 0.1 07/08/2015 1136    BMET    Component Value Date/Time   NA 141 12/30/2022 0441   NA 143 12/07/2022 1535   NA 141 04/13/2011 1125   K 3.8 12/30/2022 0441   K 3.4 (L) 04/13/2011 1125   CL 109 12/30/2022 0441   CL 105 04/13/2011 1125   CO2 24 12/30/2022 0441   CO2 29 04/13/2011 1125   GLUCOSE 132 (H) 12/30/2022 0441   GLUCOSE 111 (H) 04/13/2011 1125   BUN 19 12/30/2022 0441   BUN 16 12/07/2022 1535   BUN 14 04/13/2011 1125   CREATININE 0.76 12/30/2022 0441   CREATININE 0.80 08/01/2018 1544   CALCIUM 9.0 12/30/2022 0441   CALCIUM 9.2 04/13/2011 1125   EGFR 74 12/07/2022 1535   GFRNONAA >60 12/30/2022 0441   GFRNONAA 67 08/01/2018 1544    IMAGING past 24 hours ECHOCARDIOGRAM COMPLETE  Result Date: 12/30/2022    ECHOCARDIOGRAM REPORT   Patient Name:   Jocelyn [name] Date of  Exam: 12/30/2022 Medical Rec #:  [id number]        Height:       63.0 in Accession #:    [id number]       Weight:       151.2 lb Date of Birth:  Nov 03, 1931        BSA:          1.717 m Patient Age:    89 years         BP:           129/57 mmHg Patient Gender: F                HR:           63 bpm. Exam Location:  Inpatient Procedure: 2D Echo, Cardiac Doppler, Color Doppler and Saline Contrast Bubble            Study Indications:    Stroke  History:        Patient has prior history of Echocardiogram examinations, most                 recent 09/05/2022. CAD, Stroke, Arrythmias:Atrial Fibrillation,                 Signs/Symptoms:Dyspnea and Chest Pain; Risk Factors:Hypertension                  and Dyslipidemia.  Sonographer:    Vern Claude Referring Phys: [id number] CORTNEY E DE LA TORRE IMPRESSIONS  1. Left ventricular ejection fraction, by estimation, is 60 to 65%. The left ventricle has normal function. The left ventricle has no regional wall motion abnormalities. There is severe concentric left ventricular hypertrophy, would consider cardiac amyloidosis. Left ventricular diastolic parameters are indeterminate.  2. Right ventricular systolic function is mildly reduced. The right ventricular size is mildly enlarged. There is mildly elevated pulmonary artery systolic pressure. The estimated right ventricular systolic pressure is 40.6 mmHg.  3. Left atrial size was severely dilated.  4. Right atrial size was mildly dilated.  5. The mitral valve is degenerative. Trivial mitral valve regurgitation. Mild mitral stenosis. The mean mitral valve gradient is 4.0 mmHg. Moderate to severe mitral annular calcification.  6. The aortic valve is tricuspid. There is moderate calcification of the aortic valve. Aortic valve regurgitation is not visualized. Aortic valve sclerosis/calcification is present, without any evidence of aortic stenosis.  7. The inferior vena cava is dilated in size with <50% respiratory variability, suggesting right atrial pressure of 15 mmHg.  8. Negative bubble study, no evidence for PFO/ASD.  9. The patient appears to be in atrial fibrillation. FINDINGS  Left Ventricle: Left ventricular ejection fraction, by estimation, is 60 to 65%. The left ventricle has normal function. The left ventricle has no regional wall motion abnormalities. The left ventricular internal cavity size was normal in size. There is  severe concentric left ventricular hypertrophy. Left ventricular diastolic parameters are indeterminate. Right Ventricle: The right ventricular size is mildly enlarged. No increase in right ventricular wall thickness. Right ventricular systolic function is mildly reduced. There is mildly  elevated pulmonary artery systolic pressure. The tricuspid regurgitant  velocity is 2.53 m/s, and with an assumed right atrial pressure of 15 mmHg, the estimated right ventricular systolic pressure is 40.6 mmHg. Left Atrium: Left atrial size was severely dilated. Right Atrium: Right atrial size was mildly dilated. Pericardium: Trivial pericardial effusion is present. Mitral Valve: The mitral valve is degenerative in appearance. There is moderate calcification of the mitral valve leaflet(s).  Moderate to severe mitral annular calcification. Trivial mitral valve regurgitation. Mild mitral valve stenosis. MV peak gradient, 7.8 mmHg. The mean mitral valve gradient is 4.0 mmHg. Tricuspid Valve: The tricuspid valve is normal in structure. Tricuspid valve regurgitation is mild. Aortic Valve: The aortic valve is tricuspid. There is moderate calcification of the aortic valve. Aortic valve regurgitation is not visualized. Aortic valve sclerosis/calcification is present, without any evidence of aortic stenosis. Aortic valve mean gradient measures 3.7 mmHg. Aortic valve peak gradient measures 8.6 mmHg. Aortic valve area, by VTI measures 2.67 cm. Pulmonic Valve: The pulmonic valve was normal in structure. Pulmonic valve regurgitation is trivial. Aorta: The aortic root is normal in size and structure. Venous: The inferior vena cava is dilated in size with less than 50% respiratory variability, suggesting right atrial pressure of 15 mmHg. IAS/Shunts: Negative bubble study, no evidence for PFO/ASD. Agitated saline contrast was given intravenously to evaluate for intracardiac shunting.  LEFT VENTRICLE PLAX 2D LVIDd:         4.20 cm     Diastology LVIDs:         2.70 cm     LV e' lateral:   5.50 cm/s LV PW:         1.20 cm     LV E/e' lateral: 22.0 LV IVS:        1.40 cm LVOT diam:     1.70 cm LV SV:         67 LV SV Index:   39 LVOT Area:     2.27 cm  LV Volumes (MOD) LV vol d, MOD A2C: 59.7 ml LV vol d, MOD A4C: 78.1 ml LV vol s,  MOD A2C: 23.1 ml LV vol s, MOD A4C: 17.9 ml LV SV MOD A2C:     36.6 ml LV SV MOD A4C:     78.1 ml LV SV MOD BP:      48.9 ml RIGHT VENTRICLE            IVC RV Basal diam:  4.30 cm    IVC diam: 2.30 cm RV Mid diam:    2.50 cm RV S prime:     7.15 cm/s TAPSE (M-mode): 1.3 cm LEFT ATRIUM              Index        RIGHT ATRIUM           Index LA diam:        3.40 cm  1.98 cm/m   RA Area:     20.90 cm LA Vol (A2C):   88.2 ml  51.37 ml/m  RA Volume:   71.80 ml  41.82 ml/m LA Vol (A4C):   76.7 ml  44.67 ml/m LA Biplane Vol: 102.0 ml 59.40 ml/m  AORTIC VALVE                    PULMONIC VALVE AV Area (Vmax):    2.24 cm     PV Vmax:          0.85 m/s AV Area (Vmean):   2.32 cm     PV Peak grad:     2.9 mmHg AV Area (VTI):     2.67 cm     PR End Diast Vel: 3.93 msec AV Vmax:           146.67 cm/s AV Vmean:          82.133 cm/s AV VTI:  0.253 m AV Peak Grad:      8.6 mmHg AV Mean Grad:      3.7 mmHg LVOT Vmax:         145.00 cm/s LVOT Vmean:        83.800 cm/s LVOT VTI:          0.297 m LVOT/AV VTI ratio: 1.18  AORTA Ao Root diam: 2.80 cm Ao Asc diam:  3.20 cm MITRAL VALVE                TRICUSPID VALVE MV Area (PHT): 4.21 cm     TR Peak grad:   25.6 mmHg MV Area VTI:   2.15 cm     TR Vmax:        253.00 cm/s MV Peak grad:  7.8 mmHg MV Mean grad:  4.0 mmHg     SHUNTS MV Vmax:       1.40 m/s     Systemic VTI:  0.30 m MV Vmean:      71.2 cm/s    Systemic Diam: 1.70 cm MV Decel Time: 180 msec MV E velocity: 121.00 cm/s Dalton McleanMD Electronically signed by Wilfred Lacy Signature Date/Time: 12/30/2022/9:06:09 AM    Final    MR BRAIN WO CONTRAST  Result Date: 12/30/2022 CLINICAL DATA:  Follow-up examination for stroke. EXAM: MRI HEAD WITHOUT CONTRAST MRA HEAD WITHOUT CONTRAST TECHNIQUE: Multiplanar, multi-echo pulse sequences of the brain and surrounding structures were acquired without intravenous contrast. Angiographic images of the Circle of Willis were acquired using MRA technique without  intravenous contrast. COMPARISON:  Prior studies from 12/29/2022. FINDINGS: MRI HEAD FINDINGS Brain: Age-related cerebral volume loss. Patchy T2/FLAIR hyperintensity involving the periventricular and deep white matter both cerebral hemispheres, consistent with chronic small vessel ischemic disease, mild to moderate in nature. Mild patchy involvement of the pons. Scattered restricted diffusion involving the right cerebral hemisphere, consistent with acute right MCA distribution infarcts. Involvement of the right caudate and lentiform nuclei. Additional patchy involvement of the right insula and overlying right frontal, parietal, and temporal lobes. Blood products associated small volume petechial blood products at the posterior right frontal region (series 8, image 34) Heidelberg classification 1a: HI1, scattered small petechiae, no mass effect. No frank hemorrhagic transformation or significant regional mass effect. Gray-white matter differentiation otherwise maintained. No other acute or chronic intracranial blood products. No mass lesion or midline shift. No hydrocephalus or extra-axial fluid collection. Pituitary gland and suprasellar region within normal limits. Vascular: Major intracranial vascular flow voids are maintained. Skull and upper cervical spine: Craniocervical junction normal. Bone marrow signal intensity within normal limits. No scalp soft tissue abnormality. Sinuses/Orbits: Prior bilateral ocular lens replacement. Scattered mucosal thickening noted about the paranasal sinuses. No air-fluid levels. No significant mastoid effusion. Other: None. MRA HEAD FINDINGS Anterior circulation: Atheromatous irregularity about the carotid siphons without hemodynamically significant stenosis. 3-4 mm outpouching extending inferiorly from the supraclinoid left ICA, suspicious for a left PCOM aneurysm (series 10, image 103). There has been interval revascularization of previously seen clot at the right ICA terminus.  A1 segments patent bilaterally. Left A1 hypoplastic. Normal anterior communicating artery complex. Anterior cerebral arteries patent without significant stenosis. M1 segments patent bilaterally. Interval revascularization of previously seen right M2 occlusion. Distal MCA branches now well perfused and fairly symmetric. Posterior circulation: Both V4 segments patent without stenosis. Right vertebral artery dominant. Neither PICA origin well visualized. Basilar patent without stenosis. Superior cerebral arteries patent bilaterally. Left PCA widely patent to its distal aspect without stenosis. Focal  moderate distal right P2 stenosis noted (series 1062, image 14). Right PCA otherwise patent as well. Anatomic variants: As above.  No aneurysm. IMPRESSION: MRI HEAD: 1. Scattered acute right MCA distribution infarcts involving the right caudate and lentiform nuclei, right insula, and overlying right cerebral hemisphere. Associated small volume petechial hemorrhage at the posterior right frontal region, Heidelberg classification 1a: HI1, scattered small petechiae, no mass effect. 2. Underlying age-related cerebral volume loss with mild to moderate chronic microvascular ischemic disease. MRA HEAD: 1. Interval revascularization of previously seen clot at the right ICA terminus and right M2 segment. No residual or recurrent large vessel occlusion. 2. Focal moderate distal right P2 stenosis. No other hemodynamically significant or correctable stenosis about the intracranial arterial circulation. 3. 3-4 mm outpouching arising from the supraclinoid left ICA, suspicious for a left PCOM aneurysm. Electronically Signed   By: Rise Mu M.D.   On: 12/30/2022 02:58   MR ANGIO HEAD WO CONTRAST  Result Date: 12/30/2022 CLINICAL DATA:  Follow-up examination for stroke. EXAM: MRI HEAD WITHOUT CONTRAST MRA HEAD WITHOUT CONTRAST TECHNIQUE: Multiplanar, multi-echo pulse sequences of the brain and surrounding structures were  acquired without intravenous contrast. Angiographic images of the Circle of Willis were acquired using MRA technique without intravenous contrast. COMPARISON:  Prior studies from 12/29/2022. FINDINGS: MRI HEAD FINDINGS Brain: Age-related cerebral volume loss. Patchy T2/FLAIR hyperintensity involving the periventricular and deep white matter both cerebral hemispheres, consistent with chronic small vessel ischemic disease, mild to moderate in nature. Mild patchy involvement of the pons. Scattered restricted diffusion involving the right cerebral hemisphere, consistent with acute right MCA distribution infarcts. Involvement of the right caudate and lentiform nuclei. Additional patchy involvement of the right insula and overlying right frontal, parietal, and temporal lobes. Blood products associated small volume petechial blood products at the posterior right frontal region (series 8, image 34) Heidelberg classification 1a: HI1, scattered small petechiae, no mass effect. No frank hemorrhagic transformation or significant regional mass effect. Gray-white matter differentiation otherwise maintained. No other acute or chronic intracranial blood products. No mass lesion or midline shift. No hydrocephalus or extra-axial fluid collection. Pituitary gland and suprasellar region within normal limits. Vascular: Major intracranial vascular flow voids are maintained. Skull and upper cervical spine: Craniocervical junction normal. Bone marrow signal intensity within normal limits. No scalp soft tissue abnormality. Sinuses/Orbits: Prior bilateral ocular lens replacement. Scattered mucosal thickening noted about the paranasal sinuses. No air-fluid levels. No significant mastoid effusion. Other: None. MRA HEAD FINDINGS Anterior circulation: Atheromatous irregularity about the carotid siphons without hemodynamically significant stenosis. 3-4 mm outpouching extending inferiorly from the supraclinoid left ICA, suspicious for a left PCOM  aneurysm (series 10, image 103). There has been interval revascularization of previously seen clot at the right ICA terminus. A1 segments patent bilaterally. Left A1 hypoplastic. Normal anterior communicating artery complex. Anterior cerebral arteries patent without significant stenosis. M1 segments patent bilaterally. Interval revascularization of previously seen right M2 occlusion. Distal MCA branches now well perfused and fairly symmetric. Posterior circulation: Both V4 segments patent without stenosis. Right vertebral artery dominant. Neither PICA origin well visualized. Basilar patent without stenosis. Superior cerebral arteries patent bilaterally. Left PCA widely patent to its distal aspect without stenosis. Focal moderate distal right P2 stenosis noted (series 1062, image 14). Right PCA otherwise patent as well. Anatomic variants: As above.  No aneurysm. IMPRESSION: MRI HEAD: 1. Scattered acute right MCA distribution infarcts involving the right caudate and lentiform nuclei, right insula, and overlying right cerebral hemisphere. Associated small volume petechial hemorrhage at  the posterior right frontal region, Heidelberg classification 1a: HI1, scattered small petechiae, no mass effect. 2. Underlying age-related cerebral volume loss with mild to moderate chronic microvascular ischemic disease. MRA HEAD: 1. Interval revascularization of previously seen clot at the right ICA terminus and right M2 segment. No residual or recurrent large vessel occlusion. 2. Focal moderate distal right P2 stenosis. No other hemodynamically significant or correctable stenosis about the intracranial arterial circulation. 3. 3-4 mm outpouching arising from the supraclinoid left ICA, suspicious for a left PCOM aneurysm. Electronically Signed   By: Rise Mu M.D.   On: 12/30/2022 02:58   DG Abd Portable 1V  Result Date: 12/29/2022 CLINICAL DATA:  Orogastric tube placement EXAM: PORTABLE ABDOMEN - 1 VIEW limited for  tube placement COMPARISON:  X-ray 12/29/2022 earlier. FINDINGS: Limited view of the upper abdomen and lower chest demonstrates enteric tube with the tip extending along the fundus of the stomach projected superiorly. Minimal bowel gas elsewhere along the visualized upper abdomen. Curvature and moderate degenerative changes of the spine. IMPRESSION: Limited x-ray for tube placement has a enteric tube extending towards the fundus of the stomach Electronically Signed   By: Karen Kays M.D.   On: 12/29/2022 17:11   DG Abd Portable 1V  Result Date: 12/29/2022 CLINICAL DATA:  [id number] Encounter for orogastric (OG) tube placement [id number] EXAM: PORTABLE ABDOMEN - 1 VIEW COMPARISON:  CT abdomen pelvis 03/01/2018 FINDINGS: Enteric tube courses below the hemidiaphragm and then makes a hairpin turn with a course back cranially with tip overlying the retrocardiac region. Query finding due to patient positioning versus malpositioning of the tube versus hiatal hernia. The bowel gas pattern is normal. Previously administered intravenous contrast noted excreted by bilateral collecting systems. No radio-opaque calculi or other significant radiographic abnormality are seen. Severe atherosclerotic plaque and mitral annular calcifications. Please see chest x-ray for further details regarding the visualized cardiomediastinal silhouette and lungs. Severe degenerative changes of the lumbar spine with associated levoscoliosis centered at the mid lumbar spine. IMPRESSION: 1. Enteric tube courses below the hemidiaphragm and then makes a hairpin turn with a course back cranially with tip overlying the retrocardiac region. Query appropriate position with finding due to patient positioning versus malpositioning of the tube versus hiatal hernia. Recommend retraction by 7 cm and repeating x-ray abdomen for further evaluation. 2. Nonobstructive bowel gas pattern. 3.  Aortic Atherosclerosis (ICD10-I70.0). Electronically Signed   By: Tish Frederickson M.D.   On: 12/29/2022 16:19   DG CHEST PORT 1 VIEW  Result Date: 12/29/2022 CLINICAL DATA:  Orogastric tube placement. Gastroesophageal reflux, patient was found unresponsive, right MCA thrombosis and stroke EXAM: PORTABLE CHEST 1 VIEW COMPARISON:  03/01/2022 FINDINGS: Endotracheal tube tip 3.8 cm above the carina. An orogastric tube extends into the stomach and appears to coil back with its tip near the gastric cardia. Dense mitral valve calcification. Mild cardiomegaly. Reverse lordotic projection. Atherosclerotic calcification of the aortic arch. No blunting of the costophrenic angles. IMPRESSION: 1. Endotracheal tube tip 3.8 cm above the carina. 2. Orogastric tube extends into the stomach and appears to coil back with its tip near the gastric cardia. 3. Mild cardiomegaly. 4. Dense mitral valve calcification. Electronically Signed   By: Gaylyn Rong M.D.   On: 12/29/2022 16:18   CT ANGIO HEAD NECK W WO CM W PERF (CODE STROKE)  Result Date: 12/29/2022 CLINICAL DATA:  Right-sided gaze and aphasia with left-sided weakness. EXAM: CT ANGIOGRAPHY HEAD AND NECK CT PERFUSION BRAIN TECHNIQUE: Multidetector CT imaging of the head  and neck was performed using the standard protocol during bolus administration of intravenous contrast. Multiplanar CT image reconstructions and MIPs were obtained to evaluate the vascular anatomy. Carotid stenosis measurements (when applicable) are obtained utilizing NASCET criteria, using the distal internal carotid diameter as the denominator. Multiphase CT imaging of the brain was performed following IV bolus contrast injection. Subsequent parametric perfusion maps were calculated using RAPID software. RADIATION DOSE REDUCTION: This exam was performed according to the departmental dose-optimization program which includes automated exposure control, adjustment of the mA and/or kV according to patient size and/or use of iterative reconstruction technique. CONTRAST:   OMNIPAQUE IOHEXOL 350 MG/ML SOLN COMPARISON:  Head CT from earlier today FINDINGS: CTA NECK FINDINGS Aortic arch: Atheromatous plaque with 3 vessel branching Right carotid system: Calcified plaque at the bifurcation. No flow reducing stenosis. No ulceration or beading. Left carotid system: Moderate calcified plaque centered at the bifurcation without stenosis, ulceration, or beading. Vertebral arteries: Proximal subclavian atherosclerosis. Bulky calcified plaque causes high-grade narrowing at the right vertebral origin. There is calcified plaque at the left vertebral origin which could cause moderate stenosis, quantification limited by the degree of calcified plaque blooming. Skeleton: No acute or aggressive finding. Other neck: No acute finding Upper chest: No acute finding Review of the MIP images confirms the above findings CTA HEAD FINDINGS Anterior circulation: Luminal clot within the right ICA terminus, nearly obstructive to the MCA and completely obstructive to the right A1 segment. There is a subsequent clot with occlusion at the proximal right M2 level. Atheromatous calcification of the cavernous carotids. No contralateral branch occlusion, beading, or aneurysm. Atheromatous irregularity of intracranial branches to a moderate degree. Posterior circulation: Right dominant vertebral artery. The vertebral and basilar arteries are smoothly contoured and diffusely patent. No branch occlusion, beading, or aneurysm Venous sinuses: Negative Review of the MIP images confirms the above findings CT Brain Perfusion Findings: ASPECTS: 7 CBF (<30%) Volume: 6mL Perfusion (Tmax>6.0s) volume: Mismatch Volume: Infarction Location:Right frontal parietal white matter. Critical Value/emergent results were called by telephone at the time of interpretation on 12/29/2022 at 11:39 am to provider CORTNEY DE LA TORRE , who verbally acknowledged these results. IMPRESSION: CTA: 1. Emergent large vessel occlusion. Acute clot  at the right ICA bifurcation and obstructing clot separately at proximal right M2 level. 2. No flow reducing stenosis or embolic source seen in the more proximal vasculature. 3. Bilateral vertebral origin stenosis, advanced on the right and at least moderate on the left. CT perfusion: 150 cc area of ischemia in the right MCA territory. Underestimated core infarct when compared to aspects. Electronically Signed   By: Tiburcio Pea M.D.   On: 12/29/2022 11:48   CT HEAD CODE STROKE WO CONTRAST  Result Date: 12/29/2022 CLINICAL DATA:  Code stroke.  Left-sided weakness. EXAM: CT HEAD WITHOUT CONTRAST TECHNIQUE: Contiguous axial images were obtained from the base of the skull through the vertex without intravenous contrast. RADIATION DOSE REDUCTION: This exam was performed according to the departmental dose-optimization program which includes automated exposure control, adjustment of the mA and/or kV according to patient size and/or use of iterative reconstruction technique. COMPARISON:  Head CT 04/13/2011 FINDINGS: Brain: Cytotoxic edema appearance in the right insular cortex and striatum. No acute hemorrhage, hydrocephalus, or masslike finding. Cerebral volume loss and chronic small vessel ischemia in keeping with age. Vascular: Hyperdense right MCA. Skull: Normal. Negative for fracture or focal lesion. Sinuses/Orbits: No acute finding Other: Critical Value/emergent results were called by telephone at the time of  interpretation on 12/29/2022 at 11:38 am to provider Rockne Coons, who verbally acknowledged these results. ASPECTS [facility] Stroke Program Early CT Score) - Ganglionic level infarction (caudate, lentiform nuclei, internal capsule, insula, M1-M3 cortex): 4 - Supraganglionic infarction (M4-M6 cortex): 3 Total score (0-10 with 10 being normal): 7 IMPRESSION: 1. Cytotoxic edema in the right stratum and insula.ASPECTS is 7 2. Hyperdense right MCA. 3. No intracranial hemorrhage. Electronically Signed   By:  Tiburcio Pea M.D.   On: 12/29/2022 11:39    Vitals:   12/30/22 1045 12/30/22 1100 12/30/22 1108 12/30/22 1115  BP: (!) 151/55 (!) 151/71  (!) 156/52  Pulse: 80 72 78 82  Resp: (!) 22 20 19  (!) 21  Temp:      TempSrc:      SpO2: 100% 100% 100% 100%  Weight:      Height:         PHYSICAL EXAM General: Critically ill elderly female intubated and on sedation Psych: Unable to assess CV: Irregularly irregular on monitor Respiratory:  Regular, unlabored respirations on ventilator GI: Abdomen soft and nontender   NEURO:  Mental Status: Sedated on propofol, eyes are closed.  Does not open eyes to voice or stimulation, does not follow commands.  Does have some grimacing.  PERRL, eyes are with slight rightward gaze, no blink to threat, positive corneal reflex intact bilaterally with left corneal reflex weaker than right, no movement to painful stimuli on bilateral uppers, bilateral lowers with slight withdrawal  Cranial Nerves:  II: PERRL.  No blink to threat bilaterally III, IV, VI: EOM-slight rightward gaze is. Eyelids elevate symmetrically.  V: Sensation is intact to light touch and symmetrical to face.  VII: Will to assess due to ET tube VIII: hearing intact to voice. IX, X: Able to assess XI: Able to assess XII: tongue is midline without fasciculations. Motor: Bilateral uppers no movement to noxious stimuli, bilateral lowers slight withdrawal to noxious stimuli Tone: is normal and bulk is normal Sensation-decreased sensation Coordination: Unable to assess Gait- deferred  Most Recent NIH  1a Level of Conscious.: 2 1b LOC Questions: 2 1c LOC Commands: 2 2 Best Gaze: 1 3 Visual: 0 4 Facial Palsy: 0 5a Motor Arm - left: 4 5b Motor Arm - Right: 4 6a Motor Leg - Left: 3 6b Motor Leg - Right: 3 7 Limb Ataxia: 0 8 Sensory: 1 9 Best Language: 3 10 Dysarthria: 3 11 Extinct. 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  ASPECTS 7   CTA head & neck acute clot at the right terminal ICA and acute obstructing right MCA M2 and ACA A1. CT perfusion 6/149 in the right MCA territory.  S/p IR via right ICA direct access with TICI3 MRI scattered acute right MCA distribution infarcts involving the right caudate and lentiform nuclei, right insula, and overlying right cerebral hemisphere, small volume petechial hemorrhage at the posterior right frontal region MRA  with interval revascularization of previously seen clot at the right ICA terminus and right M2 segment, Focal moderate distal right P2 stenosis. 2D Echo EF 60 to 65%.  LV severe concentric hypertrophy.  LA severely dilated LDL 33 HgbA1c 6.4 VTE prophylaxis -SCDs Eliquis prior to admission, now on aspirin 325 mg.  Consider resuming AC  in 2-3 days post stroke. May need  heparin IV initially depends on her condition and procedure needs.  Therapy recommendations: Pending Disposition: Pending  Atrial fibrillation Home Meds: Eliquis On tele Rate controlled On aspirin 325 mg Consider resuming AC in 2-3 days post stroke. May need  heparin IV initially depends on her condition and procedure needs.   ? Cerebral aneurysm MRA head 3-4 mm outpouching arising from the supraclinoid left ICA, suspicious for a left PCOM aneurysm CTA head did not report aneurysm Yearly MRA follow up as outpt  Hypertensive Emergency Home meds: Imdur 120 mg, olmesartan-amlodipine-HCTZ 40-10-12.5 mg, spironolactone 25 mg Unstable On Cleviprex drip and wean as tolerated Restarted home Lasix and Avapro 150 mg daily, spironolactone 12.5 Blood Pressure Goal: SBP 120-160 for first 24 hours then less than 180   Hyperlipidemia Home meds: Lipitor 40 mg, resumed in hospital LDL 33, goal < 70 Continue  statin at discharge  Acute respiratory failure post procedure Managed per CCM On vent Wean as tolerated  Dysphagia Patient has post-stroke dysphagia, SLP consulted Now NPO NG tube in place.  May need to consider core track tube if patient unable to be extubated If unable to extubate will start tube feeds Advance diet as tolerated  Other Stroke Risk Factors  Obstructive sleep apnea,  on CPAP at home  Advanced age   Other Active Problems Anemia-Hgb 11.5.  No active signs of bleeding will monitor  Hospital day # 1  Gevena Mart DNP, ACNPC-AG  [facility]  ATTENDING NOTE: I reviewed above note and agree with the assessment and plan. Pt was seen and examined.   RN at the bedside. Pt still intubated, lying in bed, low dose fentanyl for sedation. Pt eyes closed, not following commands. With forced eye opening, eyes in mid position, not blinking to visual threat, doll's eyes absent, not tracking, PERRL. Corneal reflex present bilaterally, left stronger than right, gag and cough prsent. Breathing over the vent.  Facial symmetry not able to test due to ET tube.  Tongue protrusion not cooperative. On pain stimulation, no movement in BUEs, mild withdraw in BLEs. Sensation, coordination and gait not tested.  MRI confirmed right BG, caudate, insular cortex infarcts, and MRA showed now patent right terminal ICA, MCA and ACA.  Now on aspirin 81, will resume AC in 2 to 3 days depending on patient condition and possible procedure needs.  Continue statin.  Still intubated on vent, CCM on board, wean off ventilation once able.  Currently NPO w OG tube.  May consider core track if needed and start tube feeding.  PT and OT pending  For detailed assessment and plan, please refer to above/below as I have made changes wherever appropriate.   Marvel Plan, MD PhD Stroke Neurology 12/30/2022 3:26 PM  This patient is critically ill due to right MCA stroke with right MCA occlusion status post  thrombectomy, chronic A-fib on anticoagulation PTA, respiratory failure, dysphagia and at significant risk of neurological worsening, death form recurrent stroke, hemorrhagic conversion, heart failure and pneumonia. This patient's care requires constant monitoring of vital signs, hemodynamics, respiratory and cardiac monitoring, review of multiple databases, neurological assessment, discussion with family, other specialists and medical decision making of high complexity. I spent 40 minutes of neurocritical care time in the care of this patient.     To contact Stroke Continuity provider, please refer to [email or web address]. After hours, contact General Neurology

## 2022-12-30 NOTE — Evaluation (Signed)
Physical Therapy Evaluation Patient Details Name: Jocelyn [name] MRN: [id number] DOB: 08/26/1931 Today's Date: 12/30/2022  History of Present Illness  89 yo female on 11/27 L sided weakness and R MCA occlusion intubated 11/28 IR revascularization complicated arterial cannulation requiring multiple sticks, extubated 11/29.  PMH HTN CHF AFIB RVR on eliquis HLD OSA.  Clinical Impression  Patient presents with decreased mobility due to L side hemiparesis, decreased arousal, decreased cognition, decreased balance and decreased activity tolerance.  Seen today after extubation with OT and limited to in bed due to patient lethargy and SBP 166.  She was previously living independent per [facility] note, though pt unable to respond to questions or commands today.  Feel she will benefit from skilled PT in the acute setting and from follow up inpatient rehab (<3 hours/day) at d/c.       If plan is discharge home, recommend the following: Two people to help with walking and/or transfers;Direct supervision/assist for medications management;Assist for transportation;Two people to help with bathing/dressing/bathroom;Assistance with cooking/housework;Assistance with feeding   Can travel by private vehicle   No    Equipment Recommendations None recommended by PT  Recommendations for Other Services       Functional Status Assessment Patient has had a recent decline in their functional status and demonstrates the ability to make significant improvements in function in a reasonable and predictable amount of time.     Precautions / Restrictions Precautions Precautions: Fall Precaution Comments: watch O2 and BP, limit cervical rotation, bil mittens Restrictions Weight Bearing Restrictions: No      Mobility  Bed Mobility Overal bed mobility: Needs Assistance             General bed mobility comments: bed in chair position due to BP at parameters so using bed to help with arousal and no changes     Transfers                   General transfer comment: defer    Ambulation/Gait                  Stairs            Wheelchair Mobility     Tilt Bed    Modified Rankin (Stroke Patients Only) Modified Rankin (Stroke Patients Only) Pre-Morbid Rankin Score: Slight disability (per RNCM note pt living alone) Modified Rankin: Severe disability     Balance                                             Pertinent Vitals/Pain Pain Assessment Facial Expression: Relaxed, neutral Body Movements: Absence of movements Muscle Tension: Tense, rigid Compliance with ventilator (intubated pts.): N/A Vocalization (extubated pts.): Talking in normal tone or no sound CPOT Total: 1    Home Living Family/patient expects to be discharged to:: Private residence                   Additional Comments: no family present at this time and from notes appears to be from private resident community. SW notes reports lives indep    Prior Function                 ADLs Comments: unknown at this time     Extremity/Trunk Assessment   Upper Extremity Assessment Upper Extremity Assessment: Defer to OT evaluation RUE Deficits / Details: moving trying to  remove the glove and once removed not moving hand LUE Deficits / Details: flaccid, noted to have abnormal wrist alignment with edema LUE Sensation: decreased light touch;decreased proprioception LUE Coordination: decreased fine motor;decreased gross motor    Lower Extremity Assessment Lower Extremity Assessment: RLE deficits/detail;LLE deficits/detail RLE Deficits / Details: moving R leg and crossing over L at times, not following commands for testing LLE Deficits / Details: noted no active movement, pt grimacing to PROM knee flexion and noted some knee joint changes as well as stiffness in ankle; responds to pain in the leg    Cervical / Trunk Assessment Cervical / Trunk Assessment: Kyphotic   Communication   Communication Communication: Difficulty communicating thoughts/reduced clarity of speech;Difficulty following commands/understanding Following commands:  (not following commands)  Cognition Arousal: Obtunded Behavior During Therapy: Flat affect Overall Cognitive Status: Difficult to assess                                          General Comments General comments (skin integrity, edema, etc.): on O2 3.5 LPM; extubated earlier today.  SBP 166 initially, 155 end of session    Exercises     Assessment/Plan    PT Assessment Patient needs continued PT services  PT Problem List Decreased strength;Decreased balance;Decreased cognition;Decreased mobility;Decreased activity tolerance;Decreased knowledge of use of DME       PT Treatment Interventions DME instruction;Therapeutic activities;Cognitive remediation;Therapeutic exercise;Patient/family education;Balance training;Functional mobility training;Neuromuscular re-education    PT Goals (Current goals can be found in the Care Plan section)  Acute Rehab PT Goals PT Goal Formulation: Patient unable to participate in goal setting Time For Goal Achievement: 01/13/23 Potential to Achieve Goals: Fair    Frequency Min 1X/week     Co-evaluation PT/OT/SLP Co-Evaluation/Treatment: Yes Reason for Co-Treatment: Necessary to address cognition/behavior during functional activity;Complexity of the patient's impairments (multi-system involvement);For patient/therapist safety;To address functional/ADL transfers PT goals addressed during session: Mobility/safety with mobility OT goals addressed during session: ADL's and self-care       AM-PAC PT "6 Clicks" Mobility  Outcome Measure Help needed turning from your back to your side while in a flat bed without using bedrails?: Total Help needed moving from lying on your back to sitting on the side of a flat bed without using bedrails?: Total Help needed moving to  and from a bed to a chair (including a wheelchair)?: Total Help needed standing up from a chair using your arms (e.g., wheelchair or bedside chair)?: Total Help needed to walk in hospital room?: Total Help needed climbing 3-5 steps with a railing? : Total 6 Click Score: 6    End of Session Equipment Utilized During Treatment: Oxygen Activity Tolerance: Patient limited by lethargy Patient left: in bed;with bed alarm set Nurse Communication: Mobility status PT Visit Diagnosis: Other abnormalities of gait and mobility (R26.89);Hemiplegia and hemiparesis;Other symptoms and signs involving the nervous system (R29.898) Hemiplegia - Right/Left: Left Hemiplegia - dominant/non-dominant: Non-dominant Hemiplegia - caused by: Cerebral infarction    Time: 1610-9604 PT Time Calculation (min) (ACUTE ONLY): 19 min   Charges:   PT Evaluation $PT Eval Moderate Complexity: 1 Mod   PT General Charges $$ ACUTE PT VISIT: 1 Visit         Sheran Lawless, PT Acute Rehabilitation Services Office:[phone] 12/30/2022   Elray Mcgregor 12/30/2022, 6:09 PM

## 2022-12-30 NOTE — Procedures (Signed)
Cortrak  Tube Type:  Cortrak - 43 inches Tube Location:  Left nare Initial Placement:  Stomach Secured by: Bridle Technique Used to Measure Tube Placement:  Marking at nare/corner of mouth Cortrak Secured At:  70 cm   Cortrak Tube Team Note:  Consult received to place a Cortrak feeding tube.   X-ray is required, abdominal x-ray has been ordered by the Cortrak team. Please confirm tube placement before using the Cortrak tube.   If the tube becomes dislodged please keep the tube and contact the Cortrak team at [email or web address] for replacement.  If after hours and replacement cannot be delayed, place a NG tube and confirm placement with an abdominal x-ray.      MS, RD, LDN Please refer to AMION for RD and/or RD on-call/weekend/after hours pager   

## 2022-12-30 NOTE — Progress Notes (Signed)
eLink Physician-Brief Progress Note Patient Name: Jocelyn [name] DOB: October 11, 1931 MRN: [id number]   Date of Service  12/30/2022  HPI/Events of Note  I verified desire for Full Code status by speaking with patient's daughter.  eICU Interventions  Full Code ordered.        Thomasene Lot Nuriyah Hanline 12/30/2022, 11:51 PM

## 2022-12-30 NOTE — Progress Notes (Signed)
SLP Cancellation Note  Patient Details Name: Jocelyn [name] MRN: [id number] DOB: [birth year]   Cancelled treatment:       Reason Eval/Treat Not Completed: Patient not medically ready. Pt on vent this am, although note that her sedation is being weaned with goal of extubation soon. Will f/u for readiness.    Mahala Menghini., M.A. CCC-SLP Acute Rehabilitation Services Office [phone]  Secure chat preferred  12/30/2022, 10:48 AM

## 2022-12-31 ENCOUNTER — Inpatient Hospital Stay (HOSPITAL_COMMUNITY): Payer: 59

## 2022-12-31 DIAGNOSIS — I10 Essential (primary) hypertension: Secondary | ICD-10-CM | POA: Diagnosis not present

## 2022-12-31 DIAGNOSIS — I639 Cerebral infarction, unspecified: Secondary | ICD-10-CM | POA: Diagnosis not present

## 2022-12-31 DIAGNOSIS — I63231 Cerebral infarction due to unspecified occlusion or stenosis of right carotid arteries: Secondary | ICD-10-CM | POA: Diagnosis not present

## 2022-12-31 LAB — BASIC METABOLIC PANEL
Anion gap: 7 (ref 5–15)
BUN: 15 mg/dL (ref 8–23)
CO2: 23 mmol/L (ref 22–32)
Calcium: 8.7 mg/dL — ABNORMAL LOW (ref 8.9–10.3)
Chloride: 111 mmol/L (ref 98–111)
Creatinine, Ser: 0.77 mg/dL (ref 0.44–1.00)
GFR, Estimated: >60 mL/min (ref >60)
Glucose, Bld: 142 mg/dL — ABNORMAL HIGH (ref 70–99)
Potassium: 3.7 mmol/L (ref 3.5–5.1)
Sodium: 141 mmol/L (ref 135–145)

## 2022-12-31 LAB — CBC
HCT: 36.5 % (ref 36.0–46.0)
Hemoglobin: 11.7 g/dL — ABNORMAL LOW (ref 12.0–15.0)
MCH: 27 pg (ref 26.0–34.0)
MCHC: 32.1 g/dL (ref 30.0–36.0)
MCV: 84.3 fL (ref 80.0–100.0)
Platelets: 106 10*3/uL — ABNORMAL LOW (ref 150–400)
RBC: 4.33 MIL/uL (ref 3.87–5.11)
RDW: 14.4 % (ref 11.5–15.5)
WBC: 9.9 10*3/uL (ref 4.0–10.5)
nRBC: 0 % (ref 0.0–0.2)

## 2022-12-31 LAB — PHOSPHORUS: Phosphorus: 2.9 mg/dL (ref 2.5–4.6)

## 2022-12-31 LAB — GLUCOSE, CAPILLARY
Glucose-Capillary: 106 mg/dL — ABNORMAL HIGH (ref 70–99)
Glucose-Capillary: 131 mg/dL — ABNORMAL HIGH (ref 70–99)
Glucose-Capillary: 133 mg/dL — ABNORMAL HIGH (ref 70–99)
Glucose-Capillary: 138 mg/dL — ABNORMAL HIGH (ref 70–99)
Glucose-Capillary: 141 mg/dL — ABNORMAL HIGH (ref 70–99)
Glucose-Capillary: 146 mg/dL — ABNORMAL HIGH (ref 70–99)

## 2022-12-31 LAB — MAGNESIUM: Magnesium: 1.9 mg/dL (ref 1.7–2.4)

## 2022-12-31 MED ORDER — POTASSIUM CHLORIDE 20 MEQ PO PACK
40.0000 meq | PACK | Freq: Once | ORAL | Status: AC
  Administered 2022-12-31: 40 meq
  Filled 2022-12-31: qty 2

## 2022-12-31 MED ORDER — FUROSEMIDE 10 MG/ML IJ SOLN
40.0000 mg | Freq: Once | INTRAMUSCULAR | Status: AC
  Administered 2022-12-31: 40 mg via INTRAVENOUS
  Filled 2022-12-31: qty 4

## 2022-12-31 MED ORDER — SODIUM CHLORIDE 0.9 % IV SOLN
2.0000 g | INTRAVENOUS | Status: AC
  Administered 2022-12-31 – 2023-01-06 (×7): 2 g via INTRAVENOUS
  Filled 2022-12-31 (×7): qty 20

## 2022-12-31 MED ORDER — MAGNESIUM SULFATE 2 GM/50ML IV SOLN
2.0000 g | Freq: Once | INTRAVENOUS | Status: AC
Start: 2022-12-31 — End: 2022-12-31
  Administered 2022-12-31: 2 g via INTRAVENOUS
  Filled 2022-12-31: qty 50

## 2022-12-31 NOTE — Progress Notes (Signed)
Referring Physician(s): DR Jerel Shepherd  Supervising Physician: Baldemar Lenis  Patient Status:  [name] - In-pt  Chief Complaint:  CVA  Subjective:  NIR procedure 11/30:  Occlusion of a proximal right M2/MCA, and right A1/ACA with clot extending into the carotid terminus.  Mechanical thrombectomy was performed with combined stent retriever and aspiration achieving TICI3 in both right MCA and ACA  Restful IN NAD No response to me Rt neck c/d/I; no hematoma   Allergies: Patient has no known allergies.  Medications: Prior to Admission medications   Medication Sig Start Date End Date Taking? Authorizing Provider  albuterol (VENTOLIN HFA) 108 (90 Base) MCG/ACT inhaler Inhale 1 puff into the lungs as needed. 09/26/19   [provider]  alendronate (FOSAMAX) 70 MG tablet Take 70 mg by mouth once a week. 06/01/20   [provider]  apixaban (ELIQUIS) 5 MG TABS tablet TAKE 1 TABLET BY MOUTH TWICE A DAY 10/10/22   Furth, Cadence H, PA-C  atorvastatin (LIPITOR) 10 MG tablet Take 10 mg by mouth daily. 11/06/22   [provider]  atorvastatin (LIPITOR) 40 MG tablet Take 40 mg by mouth daily.    [provider]  calcium citrate-vitamin D 500-400 MG-UNIT chewable tablet Chew 1 tablet by mouth 2 (two) times daily.     [provider]  clotrimazole (LOTRIMIN) 1 % cream Apply 1 Application topically 2 (two) times daily. 11/03/22   [provider]  ferrous sulfate 325 (65 FE) MG tablet Take 1 tablet (325 mg total) by mouth daily. 12/03/18   Doren Custard, FNP  furosemide (LASIX) 40 MG tablet Take 1 tablet (40 mg total) by mouth daily. 12/07/22 03/07/23  Bensimhon, Bevelyn Buckles, MD  HYDROcodone-acetaminophen (NORCO/VICODIN) 5-325 MG tablet Take 1 tablet by mouth every 12 (twelve) hours as needed for severe pain (pain score 7-10). Must last 30 days. 12/06/22 01/05/23  Edward Jolly, MD  HYDROcodone-acetaminophen (NORCO/VICODIN) 5-325 MG tablet Take 1  tablet by mouth every 12 (twelve) hours as needed for severe pain (pain score 7-10). Must last 30 days. 01/05/23 02/04/23  Edward Jolly, MD  isosorbide mononitrate (IMDUR) 120 MG 24 hr tablet TAKE 1 TABLET BY MOUTH EVERY DAY 07/26/22   End, Cristal Deer, MD  Olmesartan-amLODIPine-HCTZ 40-10-12.5 MG TABS Take 1 tablet by mouth daily. For blood pressure 11/22/18   Doren Custard, FNP  omeprazole (PRILOSEC) 20 MG capsule Take 20 mg by mouth daily. 02/19/19   [provider]  spironolactone (ALDACTONE) 25 MG tablet Take 0.5 tablets (12.5 mg total) by mouth daily. 12/07/22 03/07/23  Bensimhon, Bevelyn Buckles, MD     Vital Signs: BP (!) 147/74   Pulse 91   Temp 99.1 F (37.3 C) (Axillary)   Resp (!) 30   Ht 5\' 3"  (1.6 m)   Wt 146 lb 2.6 oz (66.3 kg)   SpO2 97%   BMI 25.89 kg/m   Physical Exam Vitals reviewed.  Constitutional:      Comments: No response Tried to follow RN fingers slightly with her eyes---nothing for me  Musculoskeletal:     Comments: No movement  Skin:    General: Skin is warm.     Comments: Rt nrck site is c/d/I No hematoma Soft Pulses--- minimal     Imaging: DG CHEST PORT 1 VIEW  Result Date: 12/31/2022 CLINICAL DATA:  Aspiration into airway EXAM: PORTABLE CHEST 1 VIEW COMPARISON:  12/29/2022 FINDINGS: Extensive opacification of the left chest with volume loss. Marked cardiomegaly. Mitral annular  calcification that is bulky. The right lung remains clear without pulmonary edema. Feeding tube at least reaches the stomach. IMPRESSION: Significant opacification and volume loss of the left lung since prior. Electronically Signed   By: Tiburcio Pea M.D.   On: 12/31/2022 10:22   DG Abd Portable 1V  Result Date: 12/30/2022 CLINICAL DATA:  Feeding tube placement. EXAM: PORTABLE ABDOMEN - 1 VIEW COMPARISON:  December 29, 2022. FINDINGS: Distal tip of feeding tube is seen in expected position of distal stomach. IMPRESSION: Distal tip of feeding tube seen in expected  position of distal stomach. Electronically Signed   By: Lupita Raider M.D.   On: 12/30/2022 13:17   IR PERCUTANEOUS ART THROMBECTOMY/INFUSION INTRACRANIAL INC DIAG ANGIO  Result Date: 12/30/2022 INDICATION: Jocelyn [name] is an 89 year-old female presenting with left-sided weakness, right gaze deviation and altered mental status; NIHSS 19. Her last known well was 11 p.m. on 12/28/2022. Her past medical history significant for GERD, prediabetes, permanent atrial fibrillation on eliquis, HTN, HLD, OSA; baseline modified Rankin scale 1. Head CT showed hypodensity of the right patella and basal ganglia (ASPECTS 7). No IV thrombolytic a vein as patient was outside the window. CT angiogram of the head and neck showed an occlusion of a proximal right M2/MCA and proximal right A1/ACA with the ACA clot extending into the ICA terminus which remains patent. EXAM: ULTRASOUND-GUIDED VASCULAR ACCESS (X2) DIAGNOSTIC CEREBRAL ANGIOGRAM MECHANICAL THROMBECTOMY FLAT PANEL HEAD CT COMPARISON:  CT/CT angiogram of the head and neck December 29, 2022. MEDICATIONS: No antibiotics utilized. ANESTHESIA/SEDATION: The procedure was performed under general anesthesia. CONTRAST:  100 mL of Omnipaque 300 milligram/mL FLUOROSCOPY: Radiation Exposure Index (as provided by the fluoroscopic device): 1,190 mGy Kerma COMPLICATIONS: None immediate. TECHNIQUE: Informed written consent was obtained from the patient's daughter after a thorough discussion of the procedural risks, benefits and alternatives. All questions were addressed. Maximal Sterile Barrier Technique was utilized including caps, mask, sterile gowns, sterile gloves, sterile drape, hand hygiene and skin antiseptic. A timeout was performed prior to the initiation of the procedure. The right groin was prepped and draped in the usual sterile fashion. Using a micropuncture kit and the modified Seldinger technique, access was gained to the right common femoral artery and an 8 French  sheath was placed. Under fluoroscopy, a Zoom 88 guide catheter was navigated over a 6 [place] Berenstein 2 catheter and a 0.035" Terumo Glidewire into the aortic arch. Multiple attempts to gain access to the right common carotid artery proved unsuccessful. The Berenstein 2 catheter was then exchanged for a [facility] 2 catheter. The catheter tip was reformed at the aortic valve. Multiple attempts to gain access to the right common carotid artery proved unsuccessful. The catheter was subsequently withdrawn. The right arm was prepped and draped in the usual sterile fashion. Using the modified Seldinger technique and a micropuncture kit, access was gained to the right radial artery at the wrist and a 7 French sheath was placed. Real-time ultrasound guidance was utilized for vascular access including the acquisition of a permanent ultrasound image documenting patency of the accessed vessel. Then, a right radial artery angiogram was obtained via sheath side port. Next, a 5 [place] Simmons 2 glide catheter was navigated over a 0.035" Terumo Glidewire into the right subclavian artery under fluoroscopic guidance. The catheter tip was reformed at the aortic valve. The catheter was placed into the right common carotid artery. Multiple attempts to advance the wire into the right common carotid artery resulted in herniation of  the catheter into the aortic arch. The right side of the neck was prepped and draped in the usual sterile fashion. Using the modified Seldinger technique and a micropuncture kit, access was gained to the right common carotid artery at the lower neck and a 6 French sheath was placed. Real-time ultrasound guidance was utilized for vascular access including the acquisition of a permanent ultrasound image documenting patency of the accessed vessel. Back blood flow was obtained via sheath side port. Then, contrast injection performed resulted in retraction of the sheath with contrast extravasation into the neck  soft tissues, along the right carotid sheath. The sheath was subsequently removed. Using the modified Seldinger technique and a micropuncture kit, access was gained to the distal right common carotid artery, below the level of the bifurcation and a 6 French sheath was placed. The distal sheath was at the distal cervical right ICA. Real-time ultrasound guidance was utilized for vascular access including the acquisition of a permanent ultrasound image documenting patency of the accessed vessel. Right internal carotid artery angiograms with frontal and lateral views of the head and neck were obtained. FINDINGS: 1. The right radial artery originates from the axillary artery and has normal caliber. 2. Increased tortuosity of the right common carotid artery with mild atherosclerotic changes without significant stenosis. 3. Occlusion of the right A1/ACA with filling defect extending into the right carotid terminus resulting in severe stenosis. 4. Occlusion of a proximal right M2/MCA posterior division branch. PROCEDURE: Using biplane roadmap guidance, a Berenstein 2 catheter was navigated over a Terumo Glidewire into the proximal petrous segment of the right ICA. The guidewire was removed. Then, a phenom 21 microcatheter was then navigated over an Aristotle 14 microguidewire into the right M2/MCA posterior division branch. Next, a 4 x 40 mm solitaire stent retriever was deployed spanning the M1 and M2 segments. The device was allowed to intercalated with the clot for 4 minutes. The microcatheter was removed. The Berenstein 2 catheter was connected to an aspiration pump. The thrombectomy device was removed under constant aspiration. The Berenstein 2 catheter was aspirated for debris. Right internal carotid artery angiograms with frontal lateral views of the head showed partial recanalization of the right M2/MCA with slow flow the distal branches and subocclusive clots at the proximal and distal M2. The Berenstein 2 catheter  was exchanged over a Glidewire for a 5 [place] Sofia aspiration catheter. Using biplane roadmap guidance, a phenom 21 microcatheter was navigated over an Aristotle 14 microguidewire into the proximal right M3/MCA. Then, a 4 x 40 mm solitaire stent retriever was deployed spanning the M2 and M3 segments. The device was allowed to intercalated with the clot for 4 minutes. The aspiration catheter was advanced to the level of occlusion and connected to an aspiration pump. The thrombectomy device, microcatheter and aspiration catheter were retracted into the petrous right ICA under constant aspiration. The microcatheter and thrombectomy device were then removed. The aspiration catheter was manually aspirated retrieving residual clot. Right internal carotid artery angiograms with frontal and lateral views of the head show complete recanalization of the right MCA vascular tree, right A1/ACA and carotid terminus. Distal embolus to the right A3/ACA noted. Using biplane roadmap guidance, a phenom 21 microcatheter was navigated over an Aristotle 14 microguidewire into the proximal right A4/ACA. Then, a 3 mm solitaire stent retriever was deployed spanning the A2 and A3 segments. The device was allowed to intercalated with the clot for 4 minutes. The aspiration catheter was advanced to the distal A1/ACA segment and  connected to an aspiration pump. The thrombectomy device, microcatheter and aspiration catheter were retracted into the petrous right ICA under constant aspiration. The microcatheter and thrombectomy device were then removed. The aspiration catheter was manually aspirated retrieving residual clot. Right internal carotid artery angiograms with frontal and lateral views of the head showed complete recanalization the right ACA vascular tree (TICI 3). Flat panel CT of the head was obtained and post processed in a separate workstation with concurrent attending physician supervision. Selected images were sent to PACS. Trace  right sylvian subarachnoid hemorrhage/contrast extravasation. The catheter was subsequently withdrawn. Right common carotid artery angiograms with frontal and lateral views of the head were obtained via sheath side port. The puncture is at the distal right common carotid artery. A small, non flow limiting dissection is noted at the distal cervical/cavernous segment, likely related to access. The sheath was exchanged over the wire for a 6 [place] Angio-Seal which was utilized for access closure. Immediate hemostasis was achieved. Right common femoral artery angiogram was obtained in right anterior oblique view. The puncture is at the level of the common femoral artery. The artery has atherosclerotic changes with moderate stenosis. The sheath was exchanged over the wire for an 8 [place] Angio-Seal which was utilized for access closure. Immediate hemostasis was achieved. An inflatable band was placed and inflated over the right wrist access site. The vascular sheath was withdrawn and the band was slowly deflated until brisk flow was noted through the arteriotomy site. At this point, the band was reinflated with additional 4 cc of air to obtain patent hemostasis. IMPRESSION: Successful mechanical thrombectomy for treatment of an occlusion of a proximal right M2/MCA, proximal right A1/ACA and sub occlusion of the right ICA terminus achieving complete recanalization of both right MCA and ACA territories (TICI 3). PLAN: Patient remained intubated and transferred to ICU for continued monitoring. Electronically Signed   By: Baldemar Lenis M.D.   On: 12/30/2022 13:03   IR US Guide Vasc Access Right  Result Date: 12/30/2022 INDICATION: Jocelyn [name] is an 89 year-old female presenting with left-sided weakness, right gaze deviation and altered mental status; NIHSS 19. Her last known well was 11 p.m. on 12/28/2022. Her past medical history significant for GERD, prediabetes, permanent atrial fibrillation on  eliquis, HTN, HLD, OSA; baseline modified Rankin scale 1. Head CT showed hypodensity of the right patella and basal ganglia (ASPECTS 7). No IV thrombolytic a vein as patient was outside the window. CT angiogram of the head and neck showed an occlusion of a proximal right M2/MCA and proximal right A1/ACA with the ACA clot extending into the ICA terminus which remains patent. EXAM: ULTRASOUND-GUIDED VASCULAR ACCESS (X2) DIAGNOSTIC CEREBRAL ANGIOGRAM MECHANICAL THROMBECTOMY FLAT PANEL HEAD CT COMPARISON:  CT/CT angiogram of the head and neck December 29, 2022. MEDICATIONS: No antibiotics utilized. ANESTHESIA/SEDATION: The procedure was performed under general anesthesia. CONTRAST:  100 mL of Omnipaque 300 milligram/mL FLUOROSCOPY: Radiation Exposure Index (as provided by the fluoroscopic device): 1,190 mGy Kerma COMPLICATIONS: None immediate. TECHNIQUE: Informed written consent was obtained from the patient's daughter after a thorough discussion of the procedural risks, benefits and alternatives. All questions were addressed. Maximal Sterile Barrier Technique was utilized including caps, mask, sterile gowns, sterile gloves, sterile drape, hand hygiene and skin antiseptic. A timeout was performed prior to the initiation of the procedure. The right groin was prepped and draped in the usual sterile fashion. Using a micropuncture kit and the modified Seldinger technique, access was gained to the right  common femoral artery and an 8 French sheath was placed. Under fluoroscopy, a Zoom 88 guide catheter was navigated over a 6 [place] Berenstein 2 catheter and a 0.035" Terumo Glidewire into the aortic arch. Multiple attempts to gain access to the right common carotid artery proved unsuccessful. The Berenstein 2 catheter was then exchanged for a [facility] 2 catheter. The catheter tip was reformed at the aortic valve. Multiple attempts to gain access to the right common carotid artery proved unsuccessful. The catheter was  subsequently withdrawn. The right arm was prepped and draped in the usual sterile fashion. Using the modified Seldinger technique and a micropuncture kit, access was gained to the right radial artery at the wrist and a 7 French sheath was placed. Real-time ultrasound guidance was utilized for vascular access including the acquisition of a permanent ultrasound image documenting patency of the accessed vessel. Then, a right radial artery angiogram was obtained via sheath side port. Next, a 5 [place] Simmons 2 glide catheter was navigated over a 0.035" Terumo Glidewire into the right subclavian artery under fluoroscopic guidance. The catheter tip was reformed at the aortic valve. The catheter was placed into the right common carotid artery. Multiple attempts to advance the wire into the right common carotid artery resulted in herniation of the catheter into the aortic arch. The right side of the neck was prepped and draped in the usual sterile fashion. Using the modified Seldinger technique and a micropuncture kit, access was gained to the right common carotid artery at the lower neck and a 6 French sheath was placed. Real-time ultrasound guidance was utilized for vascular access including the acquisition of a permanent ultrasound image documenting patency of the accessed vessel. Back blood flow was obtained via sheath side port. Then, contrast injection performed resulted in retraction of the sheath with contrast extravasation into the neck soft tissues, along the right carotid sheath. The sheath was subsequently removed. Using the modified Seldinger technique and a micropuncture kit, access was gained to the distal right common carotid artery, below the level of the bifurcation and a 6 French sheath was placed. The distal sheath was at the distal cervical right ICA. Real-time ultrasound guidance was utilized for vascular access including the acquisition of a permanent ultrasound image documenting patency of the  accessed vessel. Right internal carotid artery angiograms with frontal and lateral views of the head and neck were obtained. FINDINGS: 1. The right radial artery originates from the axillary artery and has normal caliber. 2. Increased tortuosity of the right common carotid artery with mild atherosclerotic changes without significant stenosis. 3. Occlusion of the right A1/ACA with filling defect extending into the right carotid terminus resulting in severe stenosis. 4. Occlusion of a proximal right M2/MCA posterior division branch. PROCEDURE: Using biplane roadmap guidance, a Berenstein 2 catheter was navigated over a Terumo Glidewire into the proximal petrous segment of the right ICA. The guidewire was removed. Then, a phenom 21 microcatheter was then navigated over an Aristotle 14 microguidewire into the right M2/MCA posterior division branch. Next, a 4 x 40 mm solitaire stent retriever was deployed spanning the M1 and M2 segments. The device was allowed to intercalated with the clot for 4 minutes. The microcatheter was removed. The Berenstein 2 catheter was connected to an aspiration pump. The thrombectomy device was removed under constant aspiration. The Berenstein 2 catheter was aspirated for debris. Right internal carotid artery angiograms with frontal lateral views of the head showed partial recanalization of the right M2/MCA with slow flow the  distal branches and subocclusive clots at the proximal and distal M2. The Berenstein 2 catheter was exchanged over a Glidewire for a 5 [place] Sofia aspiration catheter. Using biplane roadmap guidance, a phenom 21 microcatheter was navigated over an Aristotle 14 microguidewire into the proximal right M3/MCA. Then, a 4 x 40 mm solitaire stent retriever was deployed spanning the M2 and M3 segments. The device was allowed to intercalated with the clot for 4 minutes. The aspiration catheter was advanced to the level of occlusion and connected to an aspiration pump. The  thrombectomy device, microcatheter and aspiration catheter were retracted into the petrous right ICA under constant aspiration. The microcatheter and thrombectomy device were then removed. The aspiration catheter was manually aspirated retrieving residual clot. Right internal carotid artery angiograms with frontal and lateral views of the head show complete recanalization of the right MCA vascular tree, right A1/ACA and carotid terminus. Distal embolus to the right A3/ACA noted. Using biplane roadmap guidance, a phenom 21 microcatheter was navigated over an Aristotle 14 microguidewire into the proximal right A4/ACA. Then, a 3 mm solitaire stent retriever was deployed spanning the A2 and A3 segments. The device was allowed to intercalated with the clot for 4 minutes. The aspiration catheter was advanced to the distal A1/ACA segment and connected to an aspiration pump. The thrombectomy device, microcatheter and aspiration catheter were retracted into the petrous right ICA under constant aspiration. The microcatheter and thrombectomy device were then removed. The aspiration catheter was manually aspirated retrieving residual clot. Right internal carotid artery angiograms with frontal and lateral views of the head showed complete recanalization the right ACA vascular tree (TICI 3). Flat panel CT of the head was obtained and post processed in a separate workstation with concurrent attending physician supervision. Selected images were sent to PACS. Trace right sylvian subarachnoid hemorrhage/contrast extravasation. The catheter was subsequently withdrawn. Right common carotid artery angiograms with frontal and lateral views of the head were obtained via sheath side port. The puncture is at the distal right common carotid artery. A small, non flow limiting dissection is noted at the distal cervical/cavernous segment, likely related to access. The sheath was exchanged over the wire for a 6 [place] Angio-Seal which was  utilized for access closure. Immediate hemostasis was achieved. Right common femoral artery angiogram was obtained in right anterior oblique view. The puncture is at the level of the common femoral artery. The artery has atherosclerotic changes with moderate stenosis. The sheath was exchanged over the wire for an 8 [place] Angio-Seal which was utilized for access closure. Immediate hemostasis was achieved. An inflatable band was placed and inflated over the right wrist access site. The vascular sheath was withdrawn and the band was slowly deflated until brisk flow was noted through the arteriotomy site. At this point, the band was reinflated with additional 4 cc of air to obtain patent hemostasis. IMPRESSION: Successful mechanical thrombectomy for treatment of an occlusion of a proximal right M2/MCA, proximal right A1/ACA and sub occlusion of the right ICA terminus achieving complete recanalization of both right MCA and ACA territories (TICI 3). PLAN: Patient remained intubated and transferred to ICU for continued monitoring. Electronically Signed   By: Baldemar Lenis M.D.   On: 12/30/2022 13:03   IR US Guide Vasc Access Right  Result Date: 12/30/2022 INDICATION: Jocelyn [name] is an 89 year-old female presenting with left-sided weakness, right gaze deviation and altered mental status; NIHSS 19. Her last known well was 11 p.m. on 12/28/2022. Her past medical  history significant for GERD, prediabetes, permanent atrial fibrillation on eliquis, HTN, HLD, OSA; baseline modified Rankin scale 1. Head CT showed hypodensity of the right patella and basal ganglia (ASPECTS 7). No IV thrombolytic a vein as patient was outside the window. CT angiogram of the head and neck showed an occlusion of a proximal right M2/MCA and proximal right A1/ACA with the ACA clot extending into the ICA terminus which remains patent. EXAM: ULTRASOUND-GUIDED VASCULAR ACCESS (X2) DIAGNOSTIC CEREBRAL ANGIOGRAM MECHANICAL  THROMBECTOMY FLAT PANEL HEAD CT COMPARISON:  CT/CT angiogram of the head and neck December 29, 2022. MEDICATIONS: No antibiotics utilized. ANESTHESIA/SEDATION: The procedure was performed under general anesthesia. CONTRAST:  100 mL of Omnipaque 300 milligram/mL FLUOROSCOPY: Radiation Exposure Index (as provided by the fluoroscopic device): 1,190 mGy Kerma COMPLICATIONS: None immediate. TECHNIQUE: Informed written consent was obtained from the patient's daughter after a thorough discussion of the procedural risks, benefits and alternatives. All questions were addressed. Maximal Sterile Barrier Technique was utilized including caps, mask, sterile gowns, sterile gloves, sterile drape, hand hygiene and skin antiseptic. A timeout was performed prior to the initiation of the procedure. The right groin was prepped and draped in the usual sterile fashion. Using a micropuncture kit and the modified Seldinger technique, access was gained to the right common femoral artery and an 8 French sheath was placed. Under fluoroscopy, a Zoom 88 guide catheter was navigated over a 6 [place] Berenstein 2 catheter and a 0.035" Terumo Glidewire into the aortic arch. Multiple attempts to gain access to the right common carotid artery proved unsuccessful. The Berenstein 2 catheter was then exchanged for a [facility] 2 catheter. The catheter tip was reformed at the aortic valve. Multiple attempts to gain access to the right common carotid artery proved unsuccessful. The catheter was subsequently withdrawn. The right arm was prepped and draped in the usual sterile fashion. Using the modified Seldinger technique and a micropuncture kit, access was gained to the right radial artery at the wrist and a 7 French sheath was placed. Real-time ultrasound guidance was utilized for vascular access including the acquisition of a permanent ultrasound image documenting patency of the accessed vessel. Then, a right radial artery angiogram was obtained via  sheath side port. Next, a 5 [place] Simmons 2 glide catheter was navigated over a 0.035" Terumo Glidewire into the right subclavian artery under fluoroscopic guidance. The catheter tip was reformed at the aortic valve. The catheter was placed into the right common carotid artery. Multiple attempts to advance the wire into the right common carotid artery resulted in herniation of the catheter into the aortic arch. The right side of the neck was prepped and draped in the usual sterile fashion. Using the modified Seldinger technique and a micropuncture kit, access was gained to the right common carotid artery at the lower neck and a 6 French sheath was placed. Real-time ultrasound guidance was utilized for vascular access including the acquisition of a permanent ultrasound image documenting patency of the accessed vessel. Back blood flow was obtained via sheath side port. Then, contrast injection performed resulted in retraction of the sheath with contrast extravasation into the neck soft tissues, along the right carotid sheath. The sheath was subsequently removed. Using the modified Seldinger technique and a micropuncture kit, access was gained to the distal right common carotid artery, below the level of the bifurcation and a 6 French sheath was placed. The distal sheath was at the distal cervical right ICA. Real-time ultrasound guidance was utilized for vascular access including the acquisition  of a permanent ultrasound image documenting patency of the accessed vessel. Right internal carotid artery angiograms with frontal and lateral views of the head and neck were obtained. FINDINGS: 1. The right radial artery originates from the axillary artery and has normal caliber. 2. Increased tortuosity of the right common carotid artery with mild atherosclerotic changes without significant stenosis. 3. Occlusion of the right A1/ACA with filling defect extending into the right carotid terminus resulting in severe stenosis. 4.  Occlusion of a proximal right M2/MCA posterior division branch. PROCEDURE: Using biplane roadmap guidance, a Berenstein 2 catheter was navigated over a Terumo Glidewire into the proximal petrous segment of the right ICA. The guidewire was removed. Then, a phenom 21 microcatheter was then navigated over an Aristotle 14 microguidewire into the right M2/MCA posterior division branch. Next, a 4 x 40 mm solitaire stent retriever was deployed spanning the M1 and M2 segments. The device was allowed to intercalated with the clot for 4 minutes. The microcatheter was removed. The Berenstein 2 catheter was connected to an aspiration pump. The thrombectomy device was removed under constant aspiration. The Berenstein 2 catheter was aspirated for debris. Right internal carotid artery angiograms with frontal lateral views of the head showed partial recanalization of the right M2/MCA with slow flow the distal branches and subocclusive clots at the proximal and distal M2. The Berenstein 2 catheter was exchanged over a Glidewire for a 5 [place] Sofia aspiration catheter. Using biplane roadmap guidance, a phenom 21 microcatheter was navigated over an Aristotle 14 microguidewire into the proximal right M3/MCA. Then, a 4 x 40 mm solitaire stent retriever was deployed spanning the M2 and M3 segments. The device was allowed to intercalated with the clot for 4 minutes. The aspiration catheter was advanced to the level of occlusion and connected to an aspiration pump. The thrombectomy device, microcatheter and aspiration catheter were retracted into the petrous right ICA under constant aspiration. The microcatheter and thrombectomy device were then removed. The aspiration catheter was manually aspirated retrieving residual clot. Right internal carotid artery angiograms with frontal and lateral views of the head show complete recanalization of the right MCA vascular tree, right A1/ACA and carotid terminus. Distal embolus to the right A3/ACA  noted. Using biplane roadmap guidance, a phenom 21 microcatheter was navigated over an Aristotle 14 microguidewire into the proximal right A4/ACA. Then, a 3 mm solitaire stent retriever was deployed spanning the A2 and A3 segments. The device was allowed to intercalated with the clot for 4 minutes. The aspiration catheter was advanced to the distal A1/ACA segment and connected to an aspiration pump. The thrombectomy device, microcatheter and aspiration catheter were retracted into the petrous right ICA under constant aspiration. The microcatheter and thrombectomy device were then removed. The aspiration catheter was manually aspirated retrieving residual clot. Right internal carotid artery angiograms with frontal and lateral views of the head showed complete recanalization the right ACA vascular tree (TICI 3). Flat panel CT of the head was obtained and post processed in a separate workstation with concurrent attending physician supervision. Selected images were sent to PACS. Trace right sylvian subarachnoid hemorrhage/contrast extravasation. The catheter was subsequently withdrawn. Right common carotid artery angiograms with frontal and lateral views of the head were obtained via sheath side port. The puncture is at the distal right common carotid artery. A small, non flow limiting dissection is noted at the distal cervical/cavernous segment, likely related to access. The sheath was exchanged over the wire for a 6 [place] Angio-Seal which was utilized for access  closure. Immediate hemostasis was achieved. Right common femoral artery angiogram was obtained in right anterior oblique view. The puncture is at the level of the common femoral artery. The artery has atherosclerotic changes with moderate stenosis. The sheath was exchanged over the wire for an 8 [place] Angio-Seal which was utilized for access closure. Immediate hemostasis was achieved. An inflatable band was placed and inflated over the right wrist access  site. The vascular sheath was withdrawn and the band was slowly deflated until brisk flow was noted through the arteriotomy site. At this point, the band was reinflated with additional 4 cc of air to obtain patent hemostasis. IMPRESSION: Successful mechanical thrombectomy for treatment of an occlusion of a proximal right M2/MCA, proximal right A1/ACA and sub occlusion of the right ICA terminus achieving complete recanalization of both right MCA and ACA territories (TICI 3). PLAN: Patient remained intubated and transferred to ICU for continued monitoring. Electronically Signed   By: Baldemar Lenis M.D.   On: 12/30/2022 13:03   IR CT Head Ltd  Result Date: 12/30/2022 INDICATION: Jocelyn [name] is an 89 year-old female presenting with left-sided weakness, right gaze deviation and altered mental status; NIHSS 19. Her last known well was 11 p.m. on 12/28/2022. Her past medical history significant for GERD, prediabetes, permanent atrial fibrillation on eliquis, HTN, HLD, OSA; baseline modified Rankin scale 1. Head CT showed hypodensity of the right patella and basal ganglia (ASPECTS 7). No IV thrombolytic a vein as patient was outside the window. CT angiogram of the head and neck showed an occlusion of a proximal right M2/MCA and proximal right A1/ACA with the ACA clot extending into the ICA terminus which remains patent. EXAM: ULTRASOUND-GUIDED VASCULAR ACCESS (X2) DIAGNOSTIC CEREBRAL ANGIOGRAM MECHANICAL THROMBECTOMY FLAT PANEL HEAD CT COMPARISON:  CT/CT angiogram of the head and neck December 29, 2022. MEDICATIONS: No antibiotics utilized. ANESTHESIA/SEDATION: The procedure was performed under general anesthesia. CONTRAST:  100 mL of Omnipaque 300 milligram/mL FLUOROSCOPY: Radiation Exposure Index (as provided by the fluoroscopic device): 1,190 mGy Kerma COMPLICATIONS: None immediate. TECHNIQUE: Informed written consent was obtained from the patient's daughter after a thorough discussion of the  procedural risks, benefits and alternatives. All questions were addressed. Maximal Sterile Barrier Technique was utilized including caps, mask, sterile gowns, sterile gloves, sterile drape, hand hygiene and skin antiseptic. A timeout was performed prior to the initiation of the procedure. The right groin was prepped and draped in the usual sterile fashion. Using a micropuncture kit and the modified Seldinger technique, access was gained to the right common femoral artery and an 8 French sheath was placed. Under fluoroscopy, a Zoom 88 guide catheter was navigated over a 6 [place] Berenstein 2 catheter and a 0.035" Terumo Glidewire into the aortic arch. Multiple attempts to gain access to the right common carotid artery proved unsuccessful. The Berenstein 2 catheter was then exchanged for a [facility] 2 catheter. The catheter tip was reformed at the aortic valve. Multiple attempts to gain access to the right common carotid artery proved unsuccessful. The catheter was subsequently withdrawn. The right arm was prepped and draped in the usual sterile fashion. Using the modified Seldinger technique and a micropuncture kit, access was gained to the right radial artery at the wrist and a 7 French sheath was placed. Real-time ultrasound guidance was utilized for vascular access including the acquisition of a permanent ultrasound image documenting patency of the accessed vessel. Then, a right radial artery angiogram was obtained via sheath side port. Next, a 5 [place]  Simmons 2 glide catheter was navigated over a 0.035" Terumo Glidewire into the right subclavian artery under fluoroscopic guidance. The catheter tip was reformed at the aortic valve. The catheter was placed into the right common carotid artery. Multiple attempts to advance the wire into the right common carotid artery resulted in herniation of the catheter into the aortic arch. The right side of the neck was prepped and draped in the usual sterile fashion. Using the  modified Seldinger technique and a micropuncture kit, access was gained to the right common carotid artery at the lower neck and a 6 French sheath was placed. Real-time ultrasound guidance was utilized for vascular access including the acquisition of a permanent ultrasound image documenting patency of the accessed vessel. Back blood flow was obtained via sheath side port. Then, contrast injection performed resulted in retraction of the sheath with contrast extravasation into the neck soft tissues, along the right carotid sheath. The sheath was subsequently removed. Using the modified Seldinger technique and a micropuncture kit, access was gained to the distal right common carotid artery, below the level of the bifurcation and a 6 French sheath was placed. The distal sheath was at the distal cervical right ICA. Real-time ultrasound guidance was utilized for vascular access including the acquisition of a permanent ultrasound image documenting patency of the accessed vessel. Right internal carotid artery angiograms with frontal and lateral views of the head and neck were obtained. FINDINGS: 1. The right radial artery originates from the axillary artery and has normal caliber. 2. Increased tortuosity of the right common carotid artery with mild atherosclerotic changes without significant stenosis. 3. Occlusion of the right A1/ACA with filling defect extending into the right carotid terminus resulting in severe stenosis. 4. Occlusion of a proximal right M2/MCA posterior division branch. PROCEDURE: Using biplane roadmap guidance, a Berenstein 2 catheter was navigated over a Terumo Glidewire into the proximal petrous segment of the right ICA. The guidewire was removed. Then, a phenom 21 microcatheter was then navigated over an Aristotle 14 microguidewire into the right M2/MCA posterior division branch. Next, a 4 x 40 mm solitaire stent retriever was deployed spanning the M1 and M2 segments. The device was allowed to  intercalated with the clot for 4 minutes. The microcatheter was removed. The Berenstein 2 catheter was connected to an aspiration pump. The thrombectomy device was removed under constant aspiration. The Berenstein 2 catheter was aspirated for debris. Right internal carotid artery angiograms with frontal lateral views of the head showed partial recanalization of the right M2/MCA with slow flow the distal branches and subocclusive clots at the proximal and distal M2. The Berenstein 2 catheter was exchanged over a Glidewire for a 5 [place] Sofia aspiration catheter. Using biplane roadmap guidance, a phenom 21 microcatheter was navigated over an Aristotle 14 microguidewire into the proximal right M3/MCA. Then, a 4 x 40 mm solitaire stent retriever was deployed spanning the M2 and M3 segments. The device was allowed to intercalated with the clot for 4 minutes. The aspiration catheter was advanced to the level of occlusion and connected to an aspiration pump. The thrombectomy device, microcatheter and aspiration catheter were retracted into the petrous right ICA under constant aspiration. The microcatheter and thrombectomy device were then removed. The aspiration catheter was manually aspirated retrieving residual clot. Right internal carotid artery angiograms with frontal and lateral views of the head show complete recanalization of the right MCA vascular tree, right A1/ACA and carotid terminus. Distal embolus to the right A3/ACA noted. Using biplane roadmap guidance,  a phenom 21 microcatheter was navigated over an Aristotle 14 microguidewire into the proximal right A4/ACA. Then, a 3 mm solitaire stent retriever was deployed spanning the A2 and A3 segments. The device was allowed to intercalated with the clot for 4 minutes. The aspiration catheter was advanced to the distal A1/ACA segment and connected to an aspiration pump. The thrombectomy device, microcatheter and aspiration catheter were retracted into the petrous  right ICA under constant aspiration. The microcatheter and thrombectomy device were then removed. The aspiration catheter was manually aspirated retrieving residual clot. Right internal carotid artery angiograms with frontal and lateral views of the head showed complete recanalization the right ACA vascular tree (TICI 3). Flat panel CT of the head was obtained and post processed in a separate workstation with concurrent attending physician supervision. Selected images were sent to PACS. Trace right sylvian subarachnoid hemorrhage/contrast extravasation. The catheter was subsequently withdrawn. Right common carotid artery angiograms with frontal and lateral views of the head were obtained via sheath side port. The puncture is at the distal right common carotid artery. A small, non flow limiting dissection is noted at the distal cervical/cavernous segment, likely related to access. The sheath was exchanged over the wire for a 6 [place] Angio-Seal which was utilized for access closure. Immediate hemostasis was achieved. Right common femoral artery angiogram was obtained in right anterior oblique view. The puncture is at the level of the common femoral artery. The artery has atherosclerotic changes with moderate stenosis. The sheath was exchanged over the wire for an 8 [place] Angio-Seal which was utilized for access closure. Immediate hemostasis was achieved. An inflatable band was placed and inflated over the right wrist access site. The vascular sheath was withdrawn and the band was slowly deflated until brisk flow was noted through the arteriotomy site. At this point, the band was reinflated with additional 4 cc of air to obtain patent hemostasis. IMPRESSION: Successful mechanical thrombectomy for treatment of an occlusion of a proximal right M2/MCA, proximal right A1/ACA and sub occlusion of the right ICA terminus achieving complete recanalization of both right MCA and ACA territories (TICI 3). PLAN: Patient remained  intubated and transferred to ICU for continued monitoring. Electronically Signed   By: Baldemar Lenis M.D.   On: 12/30/2022 13:03   ECHOCARDIOGRAM COMPLETE  Result Date: 12/30/2022    ECHOCARDIOGRAM REPORT   Patient Name:   Jocelyn [name] Date of Exam: 12/30/2022 Medical Rec #:  [id number]        Height:       63.0 in Accession #:    [id number]       Weight:       151.2 lb Date of Birth:  Dec 16, 1931        BSA:          1.717 m Patient Age:    89 years         BP:           129/57 mmHg Patient Gender: F                HR:           63 bpm. Exam Location:  Inpatient Procedure: 2D Echo, Cardiac Doppler, Color Doppler and Saline Contrast Bubble            Study Indications:    Stroke  History:        Patient has prior history of Echocardiogram examinations, most  recent 09/05/2022. CAD, Stroke, Arrythmias:Atrial Fibrillation,                 Signs/Symptoms:Dyspnea and Chest Pain; Risk Factors:Hypertension                 and Dyslipidemia.  Sonographer:    Vern Claude Referring Phys: [id number] CORTNEY E DE LA TORRE IMPRESSIONS  1. Left ventricular ejection fraction, by estimation, is 60 to 65%. The left ventricle has normal function. The left ventricle has no regional wall motion abnormalities. There is severe concentric left ventricular hypertrophy, would consider cardiac amyloidosis. Left ventricular diastolic parameters are indeterminate.  2. Right ventricular systolic function is mildly reduced. The right ventricular size is mildly enlarged. There is mildly elevated pulmonary artery systolic pressure. The estimated right ventricular systolic pressure is 40.6 mmHg.  3. Left atrial size was severely dilated.  4. Right atrial size was mildly dilated.  5. The mitral valve is degenerative. Trivial mitral valve regurgitation. Mild mitral stenosis. The mean mitral valve gradient is 4.0 mmHg. Moderate to severe mitral annular calcification.  6. The aortic valve is tricuspid. There is  moderate calcification of the aortic valve. Aortic valve regurgitation is not visualized. Aortic valve sclerosis/calcification is present, without any evidence of aortic stenosis.  7. The inferior vena cava is dilated in size with <50% respiratory variability, suggesting right atrial pressure of 15 mmHg.  8. Negative bubble study, no evidence for PFO/ASD.  9. The patient appears to be in atrial fibrillation. FINDINGS  Left Ventricle: Left ventricular ejection fraction, by estimation, is 60 to 65%. The left ventricle has normal function. The left ventricle has no regional wall motion abnormalities. The left ventricular internal cavity size was normal in size. There is  severe concentric left ventricular hypertrophy. Left ventricular diastolic parameters are indeterminate. Right Ventricle: The right ventricular size is mildly enlarged. No increase in right ventricular wall thickness. Right ventricular systolic function is mildly reduced. There is mildly elevated pulmonary artery systolic pressure. The tricuspid regurgitant  velocity is 2.53 m/s, and with an assumed right atrial pressure of 15 mmHg, the estimated right ventricular systolic pressure is 40.6 mmHg. Left Atrium: Left atrial size was severely dilated. Right Atrium: Right atrial size was mildly dilated. Pericardium: Trivial pericardial effusion is present. Mitral Valve: The mitral valve is degenerative in appearance. There is moderate calcification of the mitral valve leaflet(s). Moderate to severe mitral annular calcification. Trivial mitral valve regurgitation. Mild mitral valve stenosis. MV peak gradient, 7.8 mmHg. The mean mitral valve gradient is 4.0 mmHg. Tricuspid Valve: The tricuspid valve is normal in structure. Tricuspid valve regurgitation is mild. Aortic Valve: The aortic valve is tricuspid. There is moderate calcification of the aortic valve. Aortic valve regurgitation is not visualized. Aortic valve sclerosis/calcification is present, without  any evidence of aortic stenosis. Aortic valve mean gradient measures 3.7 mmHg. Aortic valve peak gradient measures 8.6 mmHg. Aortic valve area, by VTI measures 2.67 cm. Pulmonic Valve: The pulmonic valve was normal in structure. Pulmonic valve regurgitation is trivial. Aorta: The aortic root is normal in size and structure. Venous: The inferior vena cava is dilated in size with less than 50% respiratory variability, suggesting right atrial pressure of 15 mmHg. IAS/Shunts: Negative bubble study, no evidence for PFO/ASD. Agitated saline contrast was given intravenously to evaluate for intracardiac shunting.  LEFT VENTRICLE PLAX 2D LVIDd:         4.20 cm     Diastology LVIDs:         2.70 cm  LV e' lateral:   5.50 cm/s LV PW:         1.20 cm     LV E/e' lateral: 22.0 LV IVS:        1.40 cm LVOT diam:     1.70 cm LV SV:         67 LV SV Index:   39 LVOT Area:     2.27 cm  LV Volumes (MOD) LV vol d, MOD A2C: 59.7 ml LV vol d, MOD A4C: 78.1 ml LV vol s, MOD A2C: 23.1 ml LV vol s, MOD A4C: 17.9 ml LV SV MOD A2C:     36.6 ml LV SV MOD A4C:     78.1 ml LV SV MOD BP:      48.9 ml RIGHT VENTRICLE            IVC RV Basal diam:  4.30 cm    IVC diam: 2.30 cm RV Mid diam:    2.50 cm RV S prime:     7.15 cm/s TAPSE (M-mode): 1.3 cm LEFT ATRIUM              Index        RIGHT ATRIUM           Index LA diam:        3.40 cm  1.98 cm/m   RA Area:     20.90 cm LA Vol (A2C):   88.2 ml  51.37 ml/m  RA Volume:   71.80 ml  41.82 ml/m LA Vol (A4C):   76.7 ml  44.67 ml/m LA Biplane Vol: 102.0 ml 59.40 ml/m  AORTIC VALVE                    PULMONIC VALVE AV Area (Vmax):    2.24 cm     PV Vmax:          0.85 m/s AV Area (Vmean):   2.32 cm     PV Peak grad:     2.9 mmHg AV Area (VTI):     2.67 cm     PR End Diast Vel: 3.93 msec AV Vmax:           146.67 cm/s AV Vmean:          82.133 cm/s AV VTI:            0.253 m AV Peak Grad:      8.6 mmHg AV Mean Grad:      3.7 mmHg LVOT Vmax:         145.00 cm/s LVOT Vmean:        83.800 cm/s  LVOT VTI:          0.297 m LVOT/AV VTI ratio: 1.18  AORTA Ao Root diam: 2.80 cm Ao Asc diam:  3.20 cm MITRAL VALVE                TRICUSPID VALVE MV Area (PHT): 4.21 cm     TR Peak grad:   25.6 mmHg MV Area VTI:   2.15 cm     TR Vmax:        253.00 cm/s MV Peak grad:  7.8 mmHg MV Mean grad:  4.0 mmHg     SHUNTS MV Vmax:       1.40 m/s     Systemic VTI:  0.30 m MV Vmean:      71.2 cm/s    Systemic Diam: 1.70 cm MV Decel Time: 180 msec MV E velocity:  121.00 cm/s Wilfred Lacy Electronically signed by Wilfred Lacy Signature Date/Time: 12/30/2022/9:06:09 AM    Final    MR BRAIN WO CONTRAST  Result Date: 12/30/2022 CLINICAL DATA:  Follow-up examination for stroke. EXAM: MRI HEAD WITHOUT CONTRAST MRA HEAD WITHOUT CONTRAST TECHNIQUE: Multiplanar, multi-echo pulse sequences of the brain and surrounding structures were acquired without intravenous contrast. Angiographic images of the Circle of Willis were acquired using MRA technique without intravenous contrast. COMPARISON:  Prior studies from 12/29/2022. FINDINGS: MRI HEAD FINDINGS Brain: Age-related cerebral volume loss. Patchy T2/FLAIR hyperintensity involving the periventricular and deep white matter both cerebral hemispheres, consistent with chronic small vessel ischemic disease, mild to moderate in nature. Mild patchy involvement of the pons. Scattered restricted diffusion involving the right cerebral hemisphere, consistent with acute right MCA distribution infarcts. Involvement of the right caudate and lentiform nuclei. Additional patchy involvement of the right insula and overlying right frontal, parietal, and temporal lobes. Blood products associated small volume petechial blood products at the posterior right frontal region (series 8, image 34) Heidelberg classification 1a: HI1, scattered small petechiae, no mass effect. No frank hemorrhagic transformation or significant regional mass effect. Gray-white matter differentiation otherwise maintained. No  other acute or chronic intracranial blood products. No mass lesion or midline shift. No hydrocephalus or extra-axial fluid collection. Pituitary gland and suprasellar region within normal limits. Vascular: Major intracranial vascular flow voids are maintained. Skull and upper cervical spine: Craniocervical junction normal. Bone marrow signal intensity within normal limits. No scalp soft tissue abnormality. Sinuses/Orbits: Prior bilateral ocular lens replacement. Scattered mucosal thickening noted about the paranasal sinuses. No air-fluid levels. No significant mastoid effusion. Other: None. MRA HEAD FINDINGS Anterior circulation: Atheromatous irregularity about the carotid siphons without hemodynamically significant stenosis. 3-4 mm outpouching extending inferiorly from the supraclinoid left ICA, suspicious for a left PCOM aneurysm (series 10, image 103). There has been interval revascularization of previously seen clot at the right ICA terminus. A1 segments patent bilaterally. Left A1 hypoplastic. Normal anterior communicating artery complex. Anterior cerebral arteries patent without significant stenosis. M1 segments patent bilaterally. Interval revascularization of previously seen right M2 occlusion. Distal MCA branches now well perfused and fairly symmetric. Posterior circulation: Both V4 segments patent without stenosis. Right vertebral artery dominant. Neither PICA origin well visualized. Basilar patent without stenosis. Superior cerebral arteries patent bilaterally. Left PCA widely patent to its distal aspect without stenosis. Focal moderate distal right P2 stenosis noted (series 1062, image 14). Right PCA otherwise patent as well. Anatomic variants: As above.  No aneurysm. IMPRESSION: MRI HEAD: 1. Scattered acute right MCA distribution infarcts involving the right caudate and lentiform nuclei, right insula, and overlying right cerebral hemisphere. Associated small volume petechial hemorrhage at the posterior  right frontal region, Heidelberg classification 1a: HI1, scattered small petechiae, no mass effect. 2. Underlying age-related cerebral volume loss with mild to moderate chronic microvascular ischemic disease. MRA HEAD: 1. Interval revascularization of previously seen clot at the right ICA terminus and right M2 segment. No residual or recurrent large vessel occlusion. 2. Focal moderate distal right P2 stenosis. No other hemodynamically significant or correctable stenosis about the intracranial arterial circulation. 3. 3-4 mm outpouching arising from the supraclinoid left ICA, suspicious for a left PCOM aneurysm. Electronically Signed   By: Rise Mu M.D.   On: 12/30/2022 02:58   MR ANGIO HEAD WO CONTRAST  Result Date: 12/30/2022 CLINICAL DATA:  Follow-up examination for stroke. EXAM: MRI HEAD WITHOUT CONTRAST MRA HEAD WITHOUT CONTRAST TECHNIQUE: Multiplanar, multi-echo pulse sequences of the brain and  surrounding structures were acquired without intravenous contrast. Angiographic images of the Circle of Willis were acquired using MRA technique without intravenous contrast. COMPARISON:  Prior studies from 12/29/2022. FINDINGS: MRI HEAD FINDINGS Brain: Age-related cerebral volume loss. Patchy T2/FLAIR hyperintensity involving the periventricular and deep white matter both cerebral hemispheres, consistent with chronic small vessel ischemic disease, mild to moderate in nature. Mild patchy involvement of the pons. Scattered restricted diffusion involving the right cerebral hemisphere, consistent with acute right MCA distribution infarcts. Involvement of the right caudate and lentiform nuclei. Additional patchy involvement of the right insula and overlying right frontal, parietal, and temporal lobes. Blood products associated small volume petechial blood products at the posterior right frontal region (series 8, image 34) Heidelberg classification 1a: HI1, scattered small petechiae, no mass effect. No frank  hemorrhagic transformation or significant regional mass effect. Gray-white matter differentiation otherwise maintained. No other acute or chronic intracranial blood products. No mass lesion or midline shift. No hydrocephalus or extra-axial fluid collection. Pituitary gland and suprasellar region within normal limits. Vascular: Major intracranial vascular flow voids are maintained. Skull and upper cervical spine: Craniocervical junction normal. Bone marrow signal intensity within normal limits. No scalp soft tissue abnormality. Sinuses/Orbits: Prior bilateral ocular lens replacement. Scattered mucosal thickening noted about the paranasal sinuses. No air-fluid levels. No significant mastoid effusion. Other: None. MRA HEAD FINDINGS Anterior circulation: Atheromatous irregularity about the carotid siphons without hemodynamically significant stenosis. 3-4 mm outpouching extending inferiorly from the supraclinoid left ICA, suspicious for a left PCOM aneurysm (series 10, image 103). There has been interval revascularization of previously seen clot at the right ICA terminus. A1 segments patent bilaterally. Left A1 hypoplastic. Normal anterior communicating artery complex. Anterior cerebral arteries patent without significant stenosis. M1 segments patent bilaterally. Interval revascularization of previously seen right M2 occlusion. Distal MCA branches now well perfused and fairly symmetric. Posterior circulation: Both V4 segments patent without stenosis. Right vertebral artery dominant. Neither PICA origin well visualized. Basilar patent without stenosis. Superior cerebral arteries patent bilaterally. Left PCA widely patent to its distal aspect without stenosis. Focal moderate distal right P2 stenosis noted (series 1062, image 14). Right PCA otherwise patent as well. Anatomic variants: As above.  No aneurysm. IMPRESSION: MRI HEAD: 1. Scattered acute right MCA distribution infarcts involving the right caudate and lentiform  nuclei, right insula, and overlying right cerebral hemisphere. Associated small volume petechial hemorrhage at the posterior right frontal region, Heidelberg classification 1a: HI1, scattered small petechiae, no mass effect. 2. Underlying age-related cerebral volume loss with mild to moderate chronic microvascular ischemic disease. MRA HEAD: 1. Interval revascularization of previously seen clot at the right ICA terminus and right M2 segment. No residual or recurrent large vessel occlusion. 2. Focal moderate distal right P2 stenosis. No other hemodynamically significant or correctable stenosis about the intracranial arterial circulation. 3. 3-4 mm outpouching arising from the supraclinoid left ICA, suspicious for a left PCOM aneurysm. Electronically Signed   By: Rise Mu M.D.   On: 12/30/2022 02:58   DG Abd Portable 1V  Result Date: 12/29/2022 CLINICAL DATA:  Orogastric tube placement EXAM: PORTABLE ABDOMEN - 1 VIEW limited for tube placement COMPARISON:  X-ray 12/29/2022 earlier. FINDINGS: Limited view of the upper abdomen and lower chest demonstrates enteric tube with the tip extending along the fundus of the stomach projected superiorly. Minimal bowel gas elsewhere along the visualized upper abdomen. Curvature and moderate degenerative changes of the spine. IMPRESSION: Limited x-ray for tube placement has a enteric tube extending towards the fundus of the  stomach Electronically Signed   By: Karen Kays M.D.   On: 12/29/2022 17:11   DG Abd Portable 1V  Result Date: 12/29/2022 CLINICAL DATA:  [id number] Encounter for orogastric (OG) tube placement [id number] EXAM: PORTABLE ABDOMEN - 1 VIEW COMPARISON:  CT abdomen pelvis 03/01/2018 FINDINGS: Enteric tube courses below the hemidiaphragm and then makes a hairpin turn with a course back cranially with tip overlying the retrocardiac region. Query finding due to patient positioning versus malpositioning of the tube versus hiatal hernia. The bowel gas  pattern is normal. Previously administered intravenous contrast noted excreted by bilateral collecting systems. No radio-opaque calculi or other significant radiographic abnormality are seen. Severe atherosclerotic plaque and mitral annular calcifications. Please see chest x-ray for further details regarding the visualized cardiomediastinal silhouette and lungs. Severe degenerative changes of the lumbar spine with associated levoscoliosis centered at the mid lumbar spine. IMPRESSION: 1. Enteric tube courses below the hemidiaphragm and then makes a hairpin turn with a course back cranially with tip overlying the retrocardiac region. Query appropriate position with finding due to patient positioning versus malpositioning of the tube versus hiatal hernia. Recommend retraction by 7 cm and repeating x-ray abdomen for further evaluation. 2. Nonobstructive bowel gas pattern. 3.  Aortic Atherosclerosis (ICD10-I70.0). Electronically Signed   By: Tish Frederickson M.D.   On: 12/29/2022 16:19   DG CHEST PORT 1 VIEW  Result Date: 12/29/2022 CLINICAL DATA:  Orogastric tube placement. Gastroesophageal reflux, patient was found unresponsive, right MCA thrombosis and stroke EXAM: PORTABLE CHEST 1 VIEW COMPARISON:  03/01/2022 FINDINGS: Endotracheal tube tip 3.8 cm above the carina. An orogastric tube extends into the stomach and appears to coil back with its tip near the gastric cardia. Dense mitral valve calcification. Mild cardiomegaly. Reverse lordotic projection. Atherosclerotic calcification of the aortic arch. No blunting of the costophrenic angles. IMPRESSION: 1. Endotracheal tube tip 3.8 cm above the carina. 2. Orogastric tube extends into the stomach and appears to coil back with its tip near the gastric cardia. 3. Mild cardiomegaly. 4. Dense mitral valve calcification. Electronically Signed   By: Gaylyn Rong M.D.   On: 12/29/2022 16:18   CT ANGIO HEAD NECK W WO CM W PERF (CODE STROKE)  Result Date:  12/29/2022 CLINICAL DATA:  Right-sided gaze and aphasia with left-sided weakness. EXAM: CT ANGIOGRAPHY HEAD AND NECK CT PERFUSION BRAIN TECHNIQUE: Multidetector CT imaging of the head and neck was performed using the standard protocol during bolus administration of intravenous contrast. Multiplanar CT image reconstructions and MIPs were obtained to evaluate the vascular anatomy. Carotid stenosis measurements (when applicable) are obtained utilizing NASCET criteria, using the distal internal carotid diameter as the denominator. Multiphase CT imaging of the brain was performed following IV bolus contrast injection. Subsequent parametric perfusion maps were calculated using RAPID software. RADIATION DOSE REDUCTION: This exam was performed according to the departmental dose-optimization program which includes automated exposure control, adjustment of the mA and/or kV according to patient size and/or use of iterative reconstruction technique. CONTRAST:  OMNIPAQUE IOHEXOL 350 MG/ML SOLN COMPARISON:  Head CT from earlier today FINDINGS: CTA NECK FINDINGS Aortic arch: Atheromatous plaque with 3 vessel branching Right carotid system: Calcified plaque at the bifurcation. No flow reducing stenosis. No ulceration or beading. Left carotid system: Moderate calcified plaque centered at the bifurcation without stenosis, ulceration, or beading. Vertebral arteries: Proximal subclavian atherosclerosis. Bulky calcified plaque causes high-grade narrowing at the right vertebral origin. There is calcified plaque at the left vertebral origin which could cause moderate stenosis, quantification limited  by the degree of calcified plaque blooming. Skeleton: No acute or aggressive finding. Other neck: No acute finding Upper chest: No acute finding Review of the MIP images confirms the above findings CTA HEAD FINDINGS Anterior circulation: Luminal clot within the right ICA terminus, nearly obstructive to the MCA and completely obstructive  to the right A1 segment. There is a subsequent clot with occlusion at the proximal right M2 level. Atheromatous calcification of the cavernous carotids. No contralateral branch occlusion, beading, or aneurysm. Atheromatous irregularity of intracranial branches to a moderate degree. Posterior circulation: Right dominant vertebral artery. The vertebral and basilar arteries are smoothly contoured and diffusely patent. No branch occlusion, beading, or aneurysm Venous sinuses: Negative Review of the MIP images confirms the above findings CT Brain Perfusion Findings: ASPECTS: 7 CBF (<30%) Volume: 6mL Perfusion (Tmax>6.0s) volume: Mismatch Volume: Infarction Location:Right frontal parietal white matter. Critical Value/emergent results were called by telephone at the time of interpretation on 12/29/2022 at 11:39 am to provider CORTNEY DE LA TORRE , who verbally acknowledged these results. IMPRESSION: CTA: 1. Emergent large vessel occlusion. Acute clot at the right ICA bifurcation and obstructing clot separately at proximal right M2 level. 2. No flow reducing stenosis or embolic source seen in the more proximal vasculature. 3. Bilateral vertebral origin stenosis, advanced on the right and at least moderate on the left. CT perfusion: 150 cc area of ischemia in the right MCA territory. Underestimated core infarct when compared to aspects. Electronically Signed   By: Tiburcio Pea M.D.   On: 12/29/2022 11:48   CT HEAD CODE STROKE WO CONTRAST  Result Date: 12/29/2022 CLINICAL DATA:  Code stroke.  Left-sided weakness. EXAM: CT HEAD WITHOUT CONTRAST TECHNIQUE: Contiguous axial images were obtained from the base of the skull through the vertex without intravenous contrast. RADIATION DOSE REDUCTION: This exam was performed according to the departmental dose-optimization program which includes automated exposure control, adjustment of the mA and/or kV according to patient size and/or use of iterative reconstruction  technique. COMPARISON:  Head CT 04/13/2011 FINDINGS: Brain: Cytotoxic edema appearance in the right insular cortex and striatum. No acute hemorrhage, hydrocephalus, or masslike finding. Cerebral volume loss and chronic small vessel ischemia in keeping with age. Vascular: Hyperdense right MCA. Skull: Normal. Negative for fracture or focal lesion. Sinuses/Orbits: No acute finding Other: Critical Value/emergent results were called by telephone at the time of interpretation on 12/29/2022 at 11:38 am to provider Khaliqidina, who verbally acknowledged these results. ASPECTS [facility] Stroke Program Early CT Score) - Ganglionic level infarction (caudate, lentiform nuclei, internal capsule, insula, M1-M3 cortex): 4 - Supraganglionic infarction (M4-M6 cortex): 3 Total score (0-10 with 10 being normal): 7 IMPRESSION: 1. Cytotoxic edema in the right stratum and insula.ASPECTS is 7 2. Hyperdense right MCA. 3. No intracranial hemorrhage. Electronically Signed   By: Tiburcio Pea M.D.   On: 12/29/2022 11:39    Labs:  CBC: Recent Labs    09/19/22 0916 12/29/22 1121 12/29/22 1602 12/29/22 1706 12/30/22 0441 12/31/22 0410  WBC 4.2  --   --  4.7 7.0 9.9  HGB 12.5   < > 12.2 12.0 11.5* 11.7*  HCT 39.6   < > 36.0 35.8* 35.4* 36.5  PLT 169  --   --  144* 123* 106*   < > = values in this interval not displayed.    COAGS: Recent Labs    12/29/22 1140  INR 1.1  APTT 27    BMP: Recent Labs    09/05/22 1041 12/07/22 1535  12/29/22 1113 12/29/22 1121 12/29/22 1602 12/30/22 0441 12/31/22 0410  NA 138 143 140 143 141 141 141  K 3.7 4.2 3.7 3.8 3.5 3.8 3.7  CL 107 105 105 104  --  109 111  CO2 22 26 23   --   --  24 23  GLUCOSE 95 91 136* 139*  --  132* 142*  BUN 17 16 28* 36*  --  19 15  CALCIUM 9.4 10.0 9.7  --   --  9.0 8.7*  CREATININE 0.73 0.77 0.81 0.90  --  0.76 0.77  GFRNONAA >60  --  >60  --   --  >60 >60    LIVER FUNCTION TESTS: Recent Labs    09/05/22 1041 12/07/22 1535  12/29/22 1113 12/30/22 0441  BILITOT 1.0 0.8 1.4* 0.8  AST 31 33 37 29  ALT 40 26 29 25   ALKPHOS 40 49 41 31*  PROT 6.6 7.6 7.6 6.4*  ALBUMIN 3.8 4.4 3.9 3.4*    Assessment and Plan:  CVA Rt ACA/MCA revascularization in NIR 11/28 Plan per Dr Roda Shutters  Electronically Signed: Robet Leu, PA-C 12/31/2022, 10:34 AM   I spent a total of 15 Minutes at the the patient's bedside AND on the patient's hospital floor or unit, greater than 50% of which was counseling/coordinating care for CVA

## 2022-12-31 NOTE — Progress Notes (Signed)
Before giving bath, RN was at bedside and noted O2 saturations were beginning to decline, the lowest reading being 87% on 4L [place]. This RN then quickly suctioned the patient, and found tan-like secretions being suctioned from the patients airway.   CCM, Stroke team immediately notified. This RN advocated for a chest Xray, and titrated patients' oxygen to help recover from the event. Portable chest xray performed after order was placed, see results review. Patient is since resting comfortably, with airway patent, and saturations maintaining above 95%,  Mammie Russian, RN

## 2022-12-31 NOTE — Progress Notes (Addendum)
NAME:  Jocelyn [name], MRN:  [id number], DOB:  Feb 17, 1931, LOS: 2 ADMISSION DATE:  12/29/2022, CONSULTATION DATE:  12/31/22 REFERRING MD:  Neuro, CHIEF COMPLAINT:  stroke symptoms   History of Present Illness:  89 y.o. woman history of hypertension diastolic congestive heart failure last known normal around 11 PM 11/27 with acute left-sided weakness found to have right MCA occlusion status post revascularization, complicated arterial cannulation requiring multiple sticks and multiple sticks ultimately cannulated via the carotid artery remains intubated after procedure.  On the vent.  Sedated.  Appears to still be somewhat paralyzed.  Blood pressure is elevated.  On propofol.  Adding clevidipine.  Reordering home antihypertensives, ARB at lower dose.  Reportedly she is fully independent at baseline.  History of atrial fibrillation on Eliquis.  Will hold given multiple arterial cannulation's.  Not a TNK candidate because of this.  Pertinent  Medical History  A-fib RVR on Eliquis, HTN  Significant Hospital Events: Including procedures, antibiotic start and stop dates in addition to other pertinent events   11/28 admitted with stroke symptoms right MCA occlusion status post revascularization with complicated vascular access requiring carotid cannulation  Interim History / Subjective:  Extubated PM 11/29. Encephalopathic, seems aphasic query if left handed  Objective   Blood pressure (!) 154/72, pulse 94, temperature 99.1 F (37.3 C), temperature source Axillary, resp. rate (!) 28, height 5\' 3"  (1.6 m), weight 66.3 kg, SpO2 96%.    Vent Mode: CPAP;PSV FiO2 (%):  [36 %-40 %] 36 % PEEP:  [5 cmH20] 5 cmH20 Pressure Support:  [5 cmH20] 5 cmH20   Intake/Output Summary (Last 24 hours) at 12/31/2022 0851 Last data filed at 12/31/2022 0800 Gross per 24 hour  Intake 1493.61 ml  Output 1540 ml  Net -46.39 ml   Filed Weights   12/29/22 1100 12/31/22 0500  Weight: 68.6 kg 66.3 kg     Examination: General: Elderly, lying in bed Lungs: Clear, ventilated breath sounds Cardiovascular: Regular rate and rhythm, no murmur appreciated Abdomen: Nondistended, bowel sounds present Extremities: Edema Neuro: does not follow commands, does intermittently per RN  CXR with  likely mucous plug Left lung  Resolved Hospital Problem list     Assessment & Plan:  Acute Hypoxemic respiratory failure: likey hypoventilation at risk of aspiration given CVA, likely occurred AM 11/30 -- O2 sat goal 90%. --IV lasix 11/30 --CTX 2g daily x 7 days start 11/30  Right MCA occlusion: Status post mechanical thrombectomy -- Management per neurology -- Neurologic exam remains poor for me, recommend palliative care consult, recommend DNR given difficulty with secretions, aspiration mucous plug etc., additional time on the ventilator will not improve things will just worsen her baseline weakness and lead to further decline and expose to suffering with out assurance of reasonable expectation to improve  HTN: Meds not totally clear - on lasix and spironolactone 12.5 as well as reported "ARB" per most recent cardiology note. Previous prescribed combination pill amlodipine hydrochlorothiazide olmesartan. -- Clevidipine weaned off p.m. 11/29 -- Continue lasix (IV dosing 11/30) and spiro,  increased ARB 11/29 (at 1/2 reported old home dose - equivalent would be 300 mg daily), increase as needed  Afib: -- Rate controlled currently -- Hold eliquis with stroke and carotid access, resumption per neuro  PCCM will sign off.  Consider consultation with TRH with any additional medical concerns.  Best Practice (right click and "Reselect all SmartList Selections" daily)   Per primary  Labs   CBC: Recent Labs  Lab 12/29/22 1121 12/29/22  1602 12/29/22 1706 12/30/22 0441 12/31/22 0410  WBC  --   --  4.7 7.0 9.9  NEUTROABS  --   --  3.3  --   --   HGB 14.3 12.2 12.0 11.5* 11.7*  HCT 42.0 36.0 35.8*  35.4* 36.5  MCV  --   --  82.9 84.3 84.3  PLT  --   --  144* 123* 106*    Basic Metabolic Panel: Recent Labs  Lab 12/29/22 1113 12/29/22 1121 12/29/22 1602 12/30/22 0441 12/30/22 1629 12/31/22 0410  NA 140 143 141 141  --  141  K 3.7 3.8 3.5 3.8  --  3.7  CL 105 104  --  109  --  111  CO2 23  --   --  24  --  23  GLUCOSE 136* 139*  --  132*  --  142*  BUN 28* 36*  --  19  --  15  CREATININE 0.81 0.90  --  0.76  --  0.77  CALCIUM 9.7  --   --  9.0  --  8.7*  MG  --   --   --   --  1.8 1.9  PHOS  --   --   --   --  4.1 2.9   GFR: Estimated Creatinine Clearance: 43.7 mL/min (by C-G formula based on SCr of 0.77 mg/dL). Recent Labs  Lab 12/29/22 1706 12/30/22 0441 12/31/22 0410  WBC 4.7 7.0 9.9    Liver Function Tests: Recent Labs  Lab 12/29/22 1113 12/30/22 0441  AST 37 29  ALT 29 25  ALKPHOS 41 31*  BILITOT 1.4* 0.8  PROT 7.6 6.4*  ALBUMIN 3.9 3.4*   No results for input(s): "LIPASE", "AMYLASE" in the last 168 hours. No results for input(s): "AMMONIA" in the last 168 hours.  ABG    Component Value Date/Time   PHART 7.367 12/29/2022 1602   PCO2ART 45.2 12/29/2022 1602   PO2ART 388 (H) 12/29/2022 1602   HCO3 26.0 12/29/2022 1602   TCO2 27 12/29/2022 1602   O2SAT 100 12/29/2022 1602     Coagulation Profile: Recent Labs  Lab 12/29/22 1140  INR 1.1    Cardiac Enzymes: No results for input(s): "CKTOTAL", "CKMB", "CKMBINDEX", "TROPONINI" in the last 168 hours.  HbA1C: Hgb A1c MFr Bld  Date/Time Value Ref Range Status  12/30/2022 04:41 AM 6.4 (H) 4.8 - 5.6 % Final    Comment:    (NOTE) Pre diabetes:          5.7%-6.4%  Diabetes:              >6.4%  Glycemic control for   <7.0% adults with diabetes   12/08/2017 02:58 PM 6.1 (H) <5.7 % of total Hgb Final    Comment:    For someone without known diabetes, a hemoglobin  A1c value between 5.7% and 6.4% is consistent with prediabetes and should be confirmed with a  follow-up test. . For  someone with known diabetes, a value <7% indicates that their diabetes is well controlled. A1c targets should be individualized based on duration of diabetes, age, comorbid conditions, and other considerations. . This assay result is consistent with an increased risk of diabetes. . Currently, no consensus exists regarding use of hemoglobin A1c for diagnosis of diabetes for children. .     CBG: Recent Labs  Lab 12/29/22 1113 12/30/22 1955 12/30/22 2328 12/31/22 0331 12/31/22 0749  GLUCAP 122* 115* 129* 138* 141*    Review of  Systems:   Unobtainable due to patient intubated and sedated  Past Medical History:  She,  has a past medical history of Arthritis of both knees (08/01/2015), Colitis, Essential hypertension, benign (07/28/2014), GERD (gastroesophageal reflux disease), iron deficiency anemia (09/02/2015), Hyperlipidemia, Hypertension, Lumbar pain, Medicare annual wellness visit, subsequent (04/07/2017), OSA on CPAP (08/01/2015), Overweight (BMI 25.0-29.9) (07/10/2013), Prediabetes (09/02/2015), and SOB (shortness of breath) (07/10/2013).   Surgical History:   Past Surgical History:  Procedure Laterality Date   ABDOMINAL HYSTERECTOMY     CATARACT EXTRACTION     CHOLECYSTECTOMY     COLONOSCOPY WITH PROPOFOL N/A 04/09/2018   Procedure: COLONOSCOPY WITH PROPOFOL;  Surgeon: Toney Reil, MD;  Location: [facility] ENDOSCOPY;  Service: Gastroenterology;  Laterality: N/A;   IR CT HEAD LTD  12/29/2022   IR PERCUTANEOUS ART THROMBECTOMY/INFUSION INTRACRANIAL INC DIAG ANGIO  12/29/2022   IR US GUIDE VASC ACCESS RIGHT  12/29/2022   IR US GUIDE VASC ACCESS RIGHT  12/29/2022   LUMBAR DISC SURGERY     RADIOLOGY WITH ANESTHESIA N/A 12/29/2022   Procedure: IR WITH ANESTHESIA;  Surgeon: Radiologist, Medication, MD;  Location: [facility] OR;  Service: Radiology;  Laterality: N/A;     Social History:   reports that she has never smoked. She has never used smokeless tobacco. She reports that she does not  drink alcohol and does not use drugs.   Family History:  Her family history includes Anuerysm in her son; Cancer in her daughter and father; Diabetes in her daughter and mother; Heart disease in her mother; Hypertension in her daughter. There is no history of Breast cancer.   Allergies No Known Allergies   Home Medications  Prior to Admission medications   Medication Sig Start Date End Date Taking? Authorizing Provider  albuterol (VENTOLIN HFA) 108 (90 Base) MCG/ACT inhaler Inhale 1 puff into the lungs as needed. 09/26/19   [provider]  alendronate (FOSAMAX) 70 MG tablet Take 70 mg by mouth once a week. 06/01/20   [provider]  apixaban (ELIQUIS) 5 MG TABS tablet TAKE 1 TABLET BY MOUTH TWICE A DAY 10/10/22   Furth, Cadence H, PA-C  calcium citrate-vitamin D 500-400 MG-UNIT chewable tablet Chew 1 tablet by mouth 2 (two) times daily.     [provider]  clotrimazole (LOTRIMIN) 1 % cream Apply 1 Application topically 2 (two) times daily. 11/03/22   [provider]  ferrous sulfate 325 (65 FE) MG tablet Take 1 tablet (325 mg total) by mouth daily. 12/03/18   Doren Custard, FNP  furosemide (LASIX) 40 MG tablet Take 1 tablet (40 mg total) by mouth daily. 12/07/22 03/07/23  Bensimhon, Bevelyn Buckles, MD  HYDROcodone-acetaminophen (NORCO/VICODIN) 5-325 MG tablet Take 1 tablet by mouth every 12 (twelve) hours as needed for severe pain (pain score 7-10). Must last 30 days. 12/06/22 01/05/23  Edward Jolly, MD  HYDROcodone-acetaminophen (NORCO/VICODIN) 5-325 MG tablet Take 1 tablet by mouth every 12 (twelve) hours as needed for severe pain (pain score 7-10). Must last 30 days. 01/05/23 02/04/23  Edward Jolly, MD  isosorbide mononitrate (IMDUR) 120 MG 24 hr tablet TAKE 1 TABLET BY MOUTH EVERY DAY 07/26/22   End, Cristal Deer, MD  Olmesartan-amLODIPine-HCTZ 40-10-12.5 MG TABS Take 1 tablet by mouth daily. For blood pressure 11/22/18   Doren Custard, FNP  omeprazole (PRILOSEC) 20 MG  capsule Take 20 mg by mouth daily. 02/19/19   [provider]  spironolactone (ALDACTONE) 25 MG tablet Take 0.5 tablets (12.5 mg total) by mouth daily.  12/07/22 03/07/23  Bensimhon, Bevelyn Buckles, MD     Critical care time: n/a    Karren Burly, MD See Loretha Stapler

## 2022-12-31 NOTE — Progress Notes (Addendum)
STROKE TEAM PROGRESS NOTE   BRIEF HPI [name] is a 89 y.o. female with history of  GERD, prediabetes, permanent afibb on eliquis, HTN, HLD, OSA, also has a history of atrial fibrillation on Eliquis and appears to have been compliant with her medications.  She presented today after her brother found her unresponsive and thought that she was not breathing.  However, upon arrival of EMS, she was moving her right side and attempting to talk, although she was confused.  She was noted to have left-sided weakness, and code stroke was activated.   CT shows no acute abnormalities, acute clot at the right ICA Bifurcation and acute obstructing right MCA M2 clot was seen on CT angiogram with large penumbra on CT perfusion.  NIH on Admission 19   SIGNIFICANT HOSPITAL EVENTS 11/28.  Admitted.  CT shows no acute abnormalities, acute clot at the right ICA Bifurcation and acute obstructing right MCA M2 clot was seen on CTA with large penumbra on CT perfusion.Underwent medical thrombectomy of right MCA occlusion  INTERIM HISTORY/SUBJECTIVE  No family at the bedside. Patient was extubated yesterday.  She opens her eyes to stimulation, right gaze preference does not cross midline, does not follow commands mumbles, left facial droop, moves right side purposeful, no response to noxious stimuli on left upper arm withdraws left lower leg  This morning she did have an episode of desaturation.  Chest x-ray with left lung opacification, likely mucous plug.  She was given Lasix 40 mg and started on ceftriaxone for possible aspiration pneumonia.  OBJECTIVE  CBC    Component Value Date/Time   WBC 9.9 12/31/2022 0410   RBC 4.33 12/31/2022 0410   HGB 11.7 (L) 12/31/2022 0410   HGB 12.5 09/19/2022 0916   HCT 36.5 12/31/2022 0410   HCT 39.6 09/19/2022 0916   PLT 106 (L) 12/31/2022 0410   PLT 169 09/19/2022 0916   MCV 84.3 12/31/2022 0410   MCV 87 09/19/2022 0916   MCV 82 04/13/2011 1125   MCH 27.0  12/31/2022 0410   MCHC 32.1 12/31/2022 0410   RDW 14.4 12/31/2022 0410   RDW 12.9 09/19/2022 0916   RDW 14.4 04/13/2011 1125   LYMPHSABS 1.1 12/29/2022 1706   LYMPHSABS 3.7 (H) 07/08/2015 1136   MONOABS 0.2 12/29/2022 1706   EOSABS 0.0 12/29/2022 1706   EOSABS 0.2 07/08/2015 1136   BASOSABS 0.0 12/29/2022 1706   BASOSABS 0.1 07/08/2015 1136    BMET    Component Value Date/Time   NA 141 12/31/2022 0410   NA 143 12/07/2022 1535   NA 141 04/13/2011 1125   K 3.7 12/31/2022 0410   K 3.4 (L) 04/13/2011 1125   CL 111 12/31/2022 0410   CL 105 04/13/2011 1125   CO2 23 12/31/2022 0410   CO2 29 04/13/2011 1125   GLUCOSE 142 (H) 12/31/2022 0410   GLUCOSE 111 (H) 04/13/2011 1125   BUN 15 12/31/2022 0410   BUN 16 12/07/2022 1535   BUN 14 04/13/2011 1125   CREATININE 0.77 12/31/2022 0410   CREATININE 0.80 08/01/2018 1544   CALCIUM 8.7 (L) 12/31/2022 0410   CALCIUM 9.2 04/13/2011 1125   EGFR 74 12/07/2022 1535   GFRNONAA >60 12/31/2022 0410   GFRNONAA 67 08/01/2018 1544    IMAGING past 24 hours DG CHEST PORT 1 VIEW  Result Date: 12/31/2022 CLINICAL DATA:  Aspiration into airway EXAM: PORTABLE CHEST 1 VIEW COMPARISON:  12/29/2022 FINDINGS: Extensive opacification of the left chest with volume loss. Marked cardiomegaly.  Mitral annular calcification that is bulky. The right lung remains clear without pulmonary edema. Feeding tube at least reaches the stomach. IMPRESSION: Significant opacification and volume loss of the left lung since prior. Electronically Signed   By: Tiburcio Pea M.D.   On: 12/31/2022 10:22   DG Abd Portable 1V  Result Date: 12/30/2022 CLINICAL DATA:  Feeding tube placement. EXAM: PORTABLE ABDOMEN - 1 VIEW COMPARISON:  December 29, 2022. FINDINGS: Distal tip of feeding tube is seen in expected position of distal stomach. IMPRESSION: Distal tip of feeding tube seen in expected position of distal stomach. Electronically Signed   By: Lupita Raider M.D.   On:  12/30/2022 13:17    Vitals:   12/31/22 0730 12/31/22 0800 12/31/22 0900 12/31/22 1000  BP: (!) 156/52 (!) 154/72 (!) 169/68 (!) 147/74  Pulse: 87 94 89 91  Resp: (!) 34 (!) 28 (!) 33 (!) 30  Temp:  99.1 F (37.3 C)    TempSrc:  Axillary    SpO2: 92% 96% 94% 97%  Weight:      Height:         PHYSICAL EXAM General: Critically ill elderly African-American lady in no apparent distress Psych: Unable to assess CV: Irregularly irregular on monitor Respiratory:  Regular, unlabored respirations GI: Abdomen soft and nontender   NEURO:  Mental Status: Patient is obtunded., eyes are closed, opens eyes to noxious , Does not follow commands, mumbles incomprehensible sounds and words.  Does have some grimacing.  Left facial droop. PERRL, eyes are with slight rightward gaze, no blink to threat, positive corneal reflex intact bilaterally with left corneal reflex weaker than right, purposeful movement on right hemibody, left lower leg withdraws to noxious stimuli  Cranial Nerves:  II: PERRL.  No blink to threat bilaterally III, IV, VI: EOM-slight rightward gaze is. Eyelids elevate symmetrically.  V: Sensation is intact to light touch and symmetrical to face.  VII: left facial droop VIII: hearing intact to voice. IX, X: Able to assess XI: Able to assess XII: tongue is midline without fasciculations. Motor:  purposeful movement on right hemibody, left lower leg withdraws to noxious stimuli Tone: is normal and bulk is normal Sensation-decreased sensation Coordination: Unable to assess Gait- deferred  Most Recent NIH  1a Level of Conscious.: 2 1b LOC Questions: 2 1c LOC Commands: 2 2 Best Gaze: 1 3 Visual: 0 4 Facial Palsy: 0 5a Motor Arm - left: 4 5b Motor Arm - Right: 4 6a Motor Leg - Left: 3 6b Motor Leg - Right: 3 7 Limb Ataxia: 0 8 Sensory: 1 9 Best Language: 3 10 Dysarthria: 3 11 Extinct. and Inatten.: 2 TOTAL: 30   ASSESSMENT/PLAN  Acute Ischemic Infarct:  right MCA  territory infarct with R M2 and A1 occlusion s/p IR with TICI 3 revascularization., etiology: Cardioembolic in the setting of A-fib Code Stroke  CT head  -hyperdense right MCA, cytotoxic edema in right stratum and insula.  ASPECTS 7   CTA head & neck acute clot at the right terminal ICA and acute obstructing right MCA M2 and ACA A1. CT perfusion 6/149 in the right MCA territory.  S/p IR via right ICA direct access with TICI3 MRI scattered acute right MCA distribution infarcts involving the right caudate and lentiform nuclei, right insula, and overlying right cerebral hemisphere, small volume petechial hemorrhage at the posterior right frontal region MRA  with interval revascularization of previously seen clot at the right ICA terminus and right M2 segment, Focal moderate distal  right P2 stenosis. 2D Echo EF 60 to 65%.  LV severe concentric hypertrophy.  LA severely dilated LDL 33 HgbA1c 6.4 VTE prophylaxis -SCDs Eliquis prior to admission, now on aspirin 325 mg.  Consider resuming AC in 2-3 days post stroke. May need  heparin IV initially depends on her condition and procedure needs.  Therapy recommendations: Pending Disposition: Pending  Atrial fibrillation Home Meds: Eliquis On tele Rate controlled On aspirin 325 mg Consider resuming AC in 2-3 days post stroke. May need  heparin IV initially depends on her condition and procedure needs.   ? Cerebral aneurysm MRA head 3-4 mm outpouching arising from the supraclinoid left ICA, suspicious for a left PCOM aneurysm CTA head did not report aneurysm Yearly MRA follow up as outpt  Hypertensive Emergency Home meds: Imdur 120 mg, olmesartan-amlodipine-HCTZ 40-10-12.5 mg, spironolactone 25 mg Unstable On Cleviprex drip and wean as tolerated Restarted home Lasix and Avapro 150 mg daily, spironolactone 12.5 Blood Pressure Goal: SBP 120-160 for first 24 hours then less than 180   Hyperlipidemia Home meds: Lipitor 40 mg, resumed in  hospital LDL 33, goal < 70 Continue statin at discharge  Acute respiratory failure post procedure Hypoxia  Concern for Aspiration Pneumonia  Extubated on 11/29 CXR  11/30 left lung opacification, likely mucous plug. 40mg  Lasix x 1 on 11/30  Ceftriaxone started 11/30  Dysphagia Patient has post-stroke dysphagia, SLP consulted Now NPO Core track tube in place  TF's running Advance diet as tolerated  Other Stroke Risk Factors  Obstructive sleep apnea,  on CPAP at home  Advanced age   Other Active Problems Anemia-Hgb 11.5.  No active signs of bleeding will monitor  Hospital day # 2  Gevena Mart DNP, ACNPC-AG  [facility]  STROKE MD NOTE :  I have personally obtained history,examined this patient, reviewed notes, independently viewed imaging studies, participated in medical decision making and plan of care.ROS completed by me personally and pertinent positives fully documented  I have made any additions or clarifications directly to the above note. Agree with note above.  Patient remains lethargic and hard to arouse.  She mumbles but does not follow commands.  She has persistent right gaze preference and dense left hemiplegia and semipurposeful movements on the right.  Chances of making significant meaningful improvement in living independently are quite bleak.  She will likely need prolonged feeding tube and rehabilitation in a skilled nursing setting.  I had a long discussion with the patient's daughter who is health power of attorney along with her husband at the bedside about her prognosis and discuss goals of care.  Family seems realistic and they await arrival of her son and other daughter later this afternoon to have family meeting to discuss goals of care.  I strongly recommend DNR status at least since patient did have significant mucous plug this morning and has trouble protecting her airway.  If she is reintubated she is unlikely to be extubated soon and will likely  need prolonged ventilatory support, tracheostomy and PEG tube and nursing home care.  Family feels she may not want this like to discuss in detail on goals of care.  Discussed with Dr. Judeth Horn critical care medicine. This patient is critically ill and at significant risk of neurological worsening, death and care requires constant monitoring of vital signs, hemodynamics,respiratory and cardiac monitoring, extensive review of multiple databases, frequent neurological assessment, discussion with family, other specialists and medical decision making of high complexity.I have made any additions or clarifications directly to  the above note.This critical care time does not reflect procedure time, or teaching time or supervisory time of PA/NP/Med Resident etc but could involve care discussion time.  I spent 30 minutes of neurocritical care time  in the care of  this patient.     Delia Heady, MD Medical Director [facility] Pager: [phone] 12/31/2022 2:33 PM    To contact Stroke Continuity provider, please refer to [email or web address]. After hours, contact General Neurology

## 2022-12-31 NOTE — Progress Notes (Signed)
SLP Cancellation Note  Patient Details Name: Jocelyn [name] MRN: [id number] DOB: 05-19-1931   Cancelled treatment:       Reason Eval/Treat Not Completed: Patient's level of consciousness Per RN, patient was a little more awake and able to follow some commands yesterday but she is unable to do so today. SLP will follow for readiness.    Angela Nevin, MA, CCC-SLP Speech Therapy

## 2023-01-01 DIAGNOSIS — I63231 Cerebral infarction due to unspecified occlusion or stenosis of right carotid arteries: Secondary | ICD-10-CM | POA: Diagnosis not present

## 2023-01-01 LAB — CBC
HCT: 37.5 % (ref 36.0–46.0)
Hemoglobin: 12.1 g/dL (ref 12.0–15.0)
MCH: 27.4 pg (ref 26.0–34.0)
MCHC: 32.3 g/dL (ref 30.0–36.0)
MCV: 84.8 fL (ref 80.0–100.0)
Platelets: 99 10*3/uL — ABNORMAL LOW (ref 150–400)
RBC: 4.42 MIL/uL (ref 3.87–5.11)
RDW: 14.5 % (ref 11.5–15.5)
WBC: 8.7 10*3/uL (ref 4.0–10.5)
nRBC: 0 % (ref 0.0–0.2)

## 2023-01-01 LAB — BASIC METABOLIC PANEL
Anion gap: 6 (ref 5–15)
BUN: 23 mg/dL (ref 8–23)
CO2: 26 mmol/L (ref 22–32)
Calcium: 8.7 mg/dL — ABNORMAL LOW (ref 8.9–10.3)
Chloride: 110 mmol/L (ref 98–111)
Creatinine, Ser: 0.77 mg/dL (ref 0.44–1.00)
GFR, Estimated: >60 mL/min (ref >60)
Glucose, Bld: 142 mg/dL — ABNORMAL HIGH (ref 70–99)
Potassium: 4.3 mmol/L (ref 3.5–5.1)
Sodium: 142 mmol/L (ref 135–145)

## 2023-01-01 LAB — GLUCOSE, CAPILLARY
Glucose-Capillary: 138 mg/dL — ABNORMAL HIGH (ref 70–99)
Glucose-Capillary: 138 mg/dL — ABNORMAL HIGH (ref 70–99)
Glucose-Capillary: 115 mg/dL — ABNORMAL HIGH (ref 70–99)
Glucose-Capillary: 117 mg/dL — ABNORMAL HIGH (ref 70–99)
Glucose-Capillary: 135 mg/dL — ABNORMAL HIGH (ref 70–99)
Glucose-Capillary: 160 mg/dL — ABNORMAL HIGH (ref 70–99)

## 2023-01-01 LAB — PHOSPHORUS
Phosphorus: 2.2 mg/dL — ABNORMAL LOW (ref 2.5–4.6)
Phosphorus: 2.1 mg/dL — ABNORMAL LOW (ref 2.5–4.6)

## 2023-01-01 LAB — MAGNESIUM
Magnesium: 2.4 mg/dL (ref 1.7–2.4)
Magnesium: 2.4 mg/dL (ref 1.7–2.4)

## 2023-01-01 MED ORDER — SODIUM PHOSPHATES 45 MMOLE/15ML IV SOLN
15.0000 mmol | Freq: Once | INTRAVENOUS | Status: AC
  Administered 2023-01-01: 15 mmol via INTRAVENOUS
  Filled 2023-01-01: qty 5

## 2023-01-01 MED ORDER — FENTANYL CITRATE PF 50 MCG/ML IJ SOSY
25.0000 ug | PREFILLED_SYRINGE | Freq: Four times a day (QID) | INTRAMUSCULAR | Status: DC | PRN
  Administered 2023-01-01: 25 ug via INTRAVENOUS

## 2023-01-01 NOTE — Progress Notes (Signed)
Transfer from [employer]

## 2023-01-01 NOTE — Evaluation (Signed)
Speech Language Pathology Evaluation Patient Details Name: Jocelyn DECH MRN: [id number] DOB: [birth year] Today's Date: 01/01/2023 Time: 4098-1191 SLP Time Calculation (min) (ACUTE ONLY): 11 min  Problem List:  Patient Active Problem List   Diagnosis Date Noted   Acute ischemic stroke (HCC) 12/29/2022   Coronary artery disease of native artery of native heart with stable angina pectoris (HCC) 04/21/2022   Coronary artery calcification 10/27/2021   Aortic atherosclerosis (HCC) 10/27/2021   Lumbar facet arthropathy 03/22/2018   SI joint arthritis (HCC) 03/22/2018   Chronic pain of both knees 03/22/2018   Chronic pain syndrome 03/22/2018   Right sided colitis 12/30/2017   Impaired fasting glucose 02/06/2017   Carpal tunnel syndrome on right 09/05/2015   Gastroesophageal reflux disease without esophagitis 09/05/2015   Hx of iron deficiency anemia 09/02/2015   OSA on CPAP 08/01/2015   Bilateral primary osteoarthritis of knee 08/01/2015   Controlled substance agreement signed 07/08/2015   Essential hypertension 07/28/2014   Lumbar degenerative disc disease 07/28/2014   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   Stable angina (HCC) 07/10/2013   Hyperlipidemia LDL goal <70 07/10/2013   Cerebrovascular accident (CVA) (HCC) 07/10/2013   Dyspnea on exertion 07/10/2013   Hearing loss 01/29/2009   Arthritis sicca 05/23/2006   Past Medical History:  Past Medical History:  Diagnosis Date   Arthritis of both knees 08/01/2015   Colitis    Essential hypertension, benign 07/28/2014   GERD (gastroesophageal reflux disease)    Hx of iron deficiency anemia 09/02/2015   Hyperlipidemia    Hypertension    Lumbar pain    Medicare annual wellness visit, subsequent 04/07/2017   OSA on CPAP 08/01/2015   Overweight (BMI 25.0-29.9) 07/10/2013   Prediabetes 09/02/2015   SOB (shortness of breath) 07/10/2013   Past Surgical History:  Past Surgical History:  Procedure Laterality Date   ABDOMINAL HYSTERECTOMY      CATARACT EXTRACTION     CHOLECYSTECTOMY     COLONOSCOPY WITH PROPOFOL N/A 04/09/2018   Procedure: COLONOSCOPY WITH PROPOFOL;  Surgeon: Toney Reil, MD;  Location: [facility] END[facility]COPY;  Service: Gastroenterology;  Laterality: N/A;   IR CT HEAD LTD  12/29/2022   IR PERCUTANEOUS ART THROMBECTOMY/INFUSION INTRACRANIAL INC DIAG ANGIO  12/29/2022   IR US GUIDE VASC ACCESS RIGHT  12/29/2022   IR US GUIDE VASC ACCESS RIGHT  12/29/2022   LUMBAR DISC SURGERY     RADIOLOGY WITH ANESTHESIA N/A 12/29/2022   Procedure: IR WITH ANESTHESIA;  Surgeon: Radiologist, Medication, MD;  Location: [facility] OR;  Service: Radiology;  Laterality: N/A;   HPI:  89 yo female presenting on 11/27 with L sided weakness and R MCA occlusion s/p IR revascularization with complicated arterial cannulation requiring multiple sticks. Intubated 11/28-11/29. PMH includes: GERD, HTN, CHF, AFIB RVR on eliquis, HLD, OSA   Assessment / Plan / Recommendation Clinical Impression  Pt presents with cognitive and communicative impairments but more alert today than she has been per nursing. She told me her name and speaks in short phrases but with intelligibility fairly reduced as a result of low volume and imprecise articulation. L facial droop is noted. Her verbal responses are delayed, and so are her responses to one-step commands. She completes them with Mod cues provided. She is oriented x1 and cannot identify acute deficits, but also question impact of communication abilities on her responses. Recommend ongoing SLP f/u acutely and at next level of care.    SLP Assessment  SLP Recommendation/Assessment: Patient needs continued Speech [facility] Pathology Services SLP  Visit Diagnosis: Cognitive communication deficit (R41.841);Dysarthria and anarthria (R47.1)    Recommendations for follow up therapy are one component of a multi-disciplinary discharge planning process, led by the attending physician.  Recommendations may be updated based on patient  status, additional functional criteria and insurance authorization.    Follow Up Recommendations  Acute inpatient rehab (3hours/day)    Assistance Recommended at Discharge  Frequent or constant Supervision/Assistance  Functional Status Assessment Patient has had a recent decline in their functional status and demonstrates the ability to make significant improvements in function in a reasonable and predictable amount of time.  Frequency and Duration min 2x/week  2 weeks      SLP Evaluation Cognition  Overall Cognitive Status: Difficult to assess Arousal/Alertness: Awake/alert Orientation Level: Oriented to person;Disoriented to situation;Disoriented to place Awareness: Impaired Awareness Impairment: Intellectual impairment       Comprehension  Auditory Comprehension Overall Auditory Comprehension: Impaired Commands: Impaired One Step Basic Commands: 50-74% accurate Conversation: Simple Interfering Components: Processing speed EffectiveTechniques: Extra processing time    Expression Expression Primary Mode of Expression: Verbal Verbal Expression Overall Verbal Expression: Other (comment) (limited assessment - see clinical impressions)   Oral / Motor  Oral Motor/Sensory Function Overall Oral Motor/Sensory Function: Mild impairment Facial ROM: Suspected CN VII (facial) dysfunction;Reduced left Facial Symmetry: Suspected CN VII (facial) dysfunction;Abnormal symmetry left Facial Strength: Reduced left;Suspected CN VII (facial) dysfunction Lingual ROM: Within Functional Limits Lingual Symmetry: Within Functional Limits Lingual Strength: Within Functional Limits Velum: Within Functional Limits Mandible: Within Functional Limits Motor Speech Overall Motor Speech: Impaired Respiration: Impaired Level of Impairment: Phrase Phonation: Low vocal intensity Resonance: Within functional limits Articulation: Impaired Level of Impairment: Phrase Intelligibility: Intelligibility  reduced Phrase: 50-74% accurate Effective Techniques: Increased vocal intensity            Mahala Menghini., M.A. CCC-SLP Acute Rehabilitation Services Office [phone]  Secure chat preferred  01/01/2023, 2:04 PM

## 2023-01-01 NOTE — Progress Notes (Addendum)
STROKE TEAM PROGRESS NOTE   BRIEF HPI [name] is a 89 y.o. female with history of  GERD, prediabetes, permanent afibb on eliquis, HTN, HLD, OSA, also has a history of atrial fibrillation on Eliquis and appears to have been compliant with her medications.  She presented today after her brother found her unresponsive and thought that she was not breathing.  However, upon arrival of EMS, she was moving her right side and attempting to talk, although she was confused.  She was noted to have left-sided weakness, and code stroke was activated.   CT shows no acute abnormalities, acute clot at the right ICA Bifurcation and acute obstructing right MCA M2 clot was seen on CT angiogram with large penumbra on CT perfusion.  NIH on Admission 19   SIGNIFICANT HOSPITAL EVENTS 11/28.  Admitted.  CT shows no acute abnormalities, acute clot at the right ICA Bifurcation and acute obstructing right MCA M2 clot was seen on CTA with large penumbra on CT perfusion.Underwent medical thrombectomy of right MCA occlusion  INTERIM HISTORY/SUBJECTIVE  No family at the bedside.  When at the bedside.  Patient is on nasal cannula at 2 L Mental status has seemed to improve she does open eyes to voice and/stimulation.  Can say a few words however most is mumbled sounds, can follow some simple commands, and can lift both legs off the bed however not with stand against gravity, left arm is flaccid Phosphorus is 2.1, magnesium 2.4 both have been replaced Plan to move patient out of the ICU today  OBJECTIVE  CBC    Component Value Date/Time   WBC 8.7 01/01/2023 0550   RBC 4.42 01/01/2023 0550   HGB 12.1 01/01/2023 0550   HGB 12.5 09/19/2022 0916   HCT 37.5 01/01/2023 0550   HCT 39.6 09/19/2022 0916   PLT 99 (L) 01/01/2023 0550   PLT 169 09/19/2022 0916   MCV 84.8 01/01/2023 0550   MCV 87 09/19/2022 0916   MCV 82 04/13/2011 1125   MCH 27.4 01/01/2023 0550   MCHC 32.3 01/01/2023 0550   RDW 14.5 01/01/2023  0550   RDW 12.9 09/19/2022 0916   RDW 14.4 04/13/2011 1125   LYMPHSABS 1.1 12/29/2022 1706   LYMPHSABS 3.7 (H) 07/08/2015 1136   MONOABS 0.2 12/29/2022 1706   EOSABS 0.0 12/29/2022 1706   EOSABS 0.2 07/08/2015 1136   BASOSABS 0.0 12/29/2022 1706   BASOSABS 0.1 07/08/2015 1136    BMET    Component Value Date/Time   NA 142 01/01/2023 0550   NA 143 12/07/2022 1535   NA 141 04/13/2011 1125   K 4.3 01/01/2023 0550   K 3.4 (L) 04/13/2011 1125   CL 110 01/01/2023 0550   CL 105 04/13/2011 1125   CO2 26 01/01/2023 0550   CO2 29 04/13/2011 1125   GLUCOSE 142 (H) 01/01/2023 0550   GLUCOSE 111 (H) 04/13/2011 1125   BUN 23 01/01/2023 0550   BUN 16 12/07/2022 1535   BUN 14 04/13/2011 1125   CREATININE 0.77 01/01/2023 0550   CREATININE 0.80 08/01/2018 1544   CALCIUM 8.7 (L) 01/01/2023 0550   CALCIUM 9.2 04/13/2011 1125   EGFR 74 12/07/2022 1535   GFRNONAA >60 01/01/2023 0550   GFRNONAA 67 08/01/2018 1544    IMAGING past 24 hours DG CHEST PORT 1 VIEW  Result Date: 12/31/2022 CLINICAL DATA:  Aspiration into airway EXAM: PORTABLE CHEST 1 VIEW COMPARISON:  12/29/2022 FINDINGS: Extensive opacification of the left chest with volume loss. Marked cardiomegaly.  Mitral annular calcification that is bulky. The right lung remains clear without pulmonary edema. Feeding tube at least reaches the stomach. IMPRESSION: Significant opacification and volume loss of the left lung since prior. Electronically Signed   By: Tiburcio Pea M.D.   On: 12/31/2022 10:22    Vitals:   01/01/23 0500 01/01/23 0530 01/01/23 0600 01/01/23 0700  BP: 135/67 (!) 142/55 131/69 (!) 132/50  Pulse: 76 78 76 89  Resp: (!) 27 (!) 26 (!) 24 (!) 28  Temp:      TempSrc:      SpO2: 100% 100% 100% 100%  Weight:      Height:         PHYSICAL EXAM General: Critically ill elderly African-American lady in no apparent distress Psych: Unable to assess CV: Irregularly irregular on monitor Respiratory:  Regular, unlabored  respirations GI: Abdomen soft and nontender   NEURO:  Mental Status: Patient is lethargic, eyes are closed, opens eyes to noxious stimuli,  follows simple commands, can say a few words, mumbles incomprehensible sounds and words.  Left facial droop. PERRL, eyes are with slight rightward gaze, no blink on left,  purposeful movement on right hemibody, Cranial Nerves:  II: PERRL.  No blink to threat left III, IV, VI: EOM-slight rightward gaze is. Eyelids elevate symmetrically.  V: Sensation is intact to light touch and symmetrical to face.  VII: left facial droop VIII: hearing intact to voice. IX, X: Able to assess XI: Able to assess XII: tongue is midline without fasciculations. Motor:  purposeful movement on right hemibody,  left arm flaccid, bilateral lower extremities can lift off of bed but not withstand gravity Tone: is normal and bulk is normal Sensation-decreased sensation Coordination: Unable to assess Gait- deferred  Most Recent NIH  1a Level of Conscious.: 1 1b LOC Questions: 1 1c LOC Commands:0 2 Best Gaze: 1 3 Visual: 1 4 Facial Palsy: 1 5a Motor Arm - left: 4 5b Motor Arm - Right: 1 6a Motor Leg - Left: 3 6b Motor Leg - Right: 2 7 Limb Ataxia: 0 8 Sensory: 1 9 Best Language: 2 10 Dysarthria: 2 11 Extinct. and Inatten.: 1 TOTAL: 21   ASSESSMENT/PLAN  Acute Ischemic Infarct:  right MCA territory infarct with R M2 and A1 occlusion s/p IR with TICI 3 revascularization., etiology: Cardioembolic in the setting of A-fib Code Stroke  CT head  -hyperdense right MCA, cytotoxic edema in right stratum and insula.  ASPECTS 7   CTA head & neck acute clot at the right terminal ICA and acute obstructing right MCA M2 and ACA A1. CT perfusion 6/149 in the right MCA territory.  S/p IR via right ICA direct access with TICI3 MRI scattered acute right MCA distribution infarcts involving the right caudate and lentiform nuclei, right insula, and overlying right cerebral hemisphere,  small volume petechial hemorrhage at the posterior right frontal region MRA  with interval revascularization of previously seen clot at the right ICA terminus and right M2 segment, Focal moderate distal right P2 stenosis. 2D Echo EF 60 to 65%.  LV severe concentric hypertrophy.  LA severely dilated LDL 33 HgbA1c 6.4 VTE prophylaxis -SCDs Eliquis prior to admission, now on aspirin 325 mg.  Consider resuming AC in 2-3 days post stroke. May need  heparin IV initially depends on her condition and procedure needs.  Therapy recommendations: Pending Disposition: Pending  Atrial fibrillation Home Meds: Eliquis On tele Rate controlled On aspirin 325 mg Consider resuming AC in 2-3 days post stroke. May  need  heparin IV initially depends on her condition and procedure needs.   ? Cerebral aneurysm MRA head 3-4 mm outpouching arising from the supraclinoid left ICA, suspicious for a left PCOM aneurysm CTA head did not report aneurysm Yearly MRA follow up as outpt  Hypertensive Emergency Home meds: Imdur 120 mg, olmesartan-amlodipine-HCTZ 40-10-12.5 mg, spironolactone 25 mg Unstable On Cleviprex drip and wean as tolerated Restarted home Lasix and Avapro 150 mg daily, spironolactone 12.5 Blood Pressure Goal: SBP 120-160 for first 24 hours then less than 180   Hyperlipidemia Home meds: Lipitor 40 mg, resumed in hospital LDL 33, goal < 70 Continue statin at discharge  Acute respiratory failure post procedure Hypoxia  Concern for Aspiration Pneumonia  Extubated on 11/29 CXR  11/30 left lung opacification, likely mucous plug. 40mg  Lasix x 1 on 11/30  Ceftriaxone started 11/30  Dysphagia Patient has post-stroke dysphagia, SLP consulted Now NPO Core track tube in place  TF's running Advance diet as tolerated  Other Stroke Risk Factors  Obstructive sleep apnea,  on CPAP at home  Advanced age   Other Active Problems Anemia-Hgb 11.5.  No active signs of bleeding will  monitor Hypophosphatemia being replaced  Hospital day # 3  Gevena Mart DNP, ACNPC-AG  [facility]  I have personally obtained history,examined this patient, reviewed notes, independently viewed imaging studies, participated in medical decision making and plan of care.ROS completed by me personally and pertinent positives fully documented  I have made any additions or clarifications directly to the above note. Agree with note above.  Patient slightly more arousable and interactive today but continues to have right gaze preference and dense left hemiplegia and dysphagia.  Continue close neurological observation and resume home medications via core track tube.  No family available at the bedside for discussion today.  Plan to transfer out of ICU when bed available.  Greater than 50% time during this 50-minute visit was spent in counseling and coordination of care about her stroke and hemiplegia and dysphagia and discussion with care team and answering questions  Delia Heady, MD Medical Director [facility] Pager: [phone] 01/01/2023 3:57 PM    To contact Stroke Continuity provider, please refer to [email or web address]. After hours, contact General Neurology

## 2023-01-01 NOTE — Evaluation (Signed)
Clinical/Bedside Swallow Evaluation Patient Details  Name: Jocelyn [name] MRN: [id number] Date of Birth: 02-23-1931  Today's Date: 01/01/2023 Time: SLP Start Time (ACUTE ONLY): 1143 SLP Stop Time (ACUTE ONLY): 1154 SLP Time Calculation (min) (ACUTE ONLY): 11 min  Past Medical History:  Past Medical History:  Diagnosis Date   Arthritis of both knees 08/01/2015   Colitis    Essential hypertension, benign 07/28/2014   GERD (gastroesophageal reflux disease)    Hx of iron deficiency anemia 09/02/2015   Hyperlipidemia    Hypertension    Lumbar pain    Medicare annual wellness visit, subsequent 04/07/2017   OSA on CPAP 08/01/2015   Overweight (BMI 25.0-29.9) 07/10/2013   Prediabetes 09/02/2015   SOB (shortness of breath) 07/10/2013   Past Surgical History:  Past Surgical History:  Procedure Laterality Date   ABDOMINAL HYSTERECTOMY     CATARACT EXTRACTION     CHOLECYSTECTOMY     COLONOSCOPY WITH PROPOFOL N/A 04/09/2018   Procedure: COLONOSCOPY WITH PROPOFOL;  Surgeon: Toney Reil, MD;  Location: [facility] ENDOSCOPY;  Service: Gastroenterology;  Laterality: N/A;   IR CT HEAD LTD  12/29/2022   IR PERCUTANEOUS ART THROMBECTOMY/INFUSION INTRACRANIAL INC DIAG ANGIO  12/29/2022   IR US GUIDE VASC ACCESS RIGHT  12/29/2022   IR US GUIDE VASC ACCESS RIGHT  12/29/2022   LUMBAR DISC SURGERY     RADIOLOGY WITH ANESTHESIA N/A 12/29/2022   Procedure: IR WITH ANESTHESIA;  Surgeon: Radiologist, Medication, MD;  Location: [facility] OR;  Service: Radiology;  Laterality: N/A;   HPI:  89 yo female presenting on 11/27 with L sided weakness and R MCA occlusion s/p IR revascularization with complicated arterial cannulation requiring multiple sticks. Intubated 11/28-11/29. PMH includes: GERD, HTN, CHF, AFIB RVR on eliquis, HLD, OSA    Assessment / Plan / Recommendation  Clinical Impression  Pt has L facial weakness and shows signs of dysphagia including multiple subswallows. She has 5+ swallows with small sips of water,  and more like 2-3 per bolus with purees. Although no overt s/s of aspiration are noted, recommend considering MBS prior to PO diet when also considering her mentation, acute neurological changes, and recent intubation. Could offer a few pieces of ice after oral care from nursing. Otherwise, SLP will f/u for readiness to complete MBS if she can maintain her current level of alertness.  SLP Visit Diagnosis: Dysphagia, unspecified (R13.10)    Aspiration Risk  Moderate aspiration risk    Diet Recommendation NPO;Alternative means - temporary;Ice chips PRN after oral care (2-3 pieces of ice from nursing after completion of oral care)    Medication Administration: Via alternative means    Other  Recommendations Oral Care Recommendations: Oral care QID;Oral care prior to ice chip/H20 Caregiver Recommendations: Have oral suction available    Recommendations for follow up therapy are one component of a multi-disciplinary discharge planning process, led by the attending physician.  Recommendations may be updated based on patient status, additional functional criteria and insurance authorization.  Follow up Recommendations Acute inpatient rehab (3hours/day)      Assistance Recommended at Discharge    Functional Status Assessment Patient has had a recent decline in their functional status and demonstrates the ability to make significant improvements in function in a reasonable and predictable amount of time.  Frequency and Duration min 2x/week  2 weeks       Prognosis Prognosis for improved oropharyngeal function: Good Barriers to Reach Goals: Cognitive deficits      Swallow Study   General  HPI: 89 yo female presenting on 11/27 with L sided weakness and R MCA occlusion s/p IR revascularization with complicated arterial cannulation requiring multiple sticks. Intubated 11/28-11/29. PMH includes: GERD, HTN, CHF, AFIB RVR on eliquis, HLD, OSA Type of Study: Bedside Swallow Evaluation Previous  Swallow Assessment: none in chart Diet Prior to this Study: NPO;Cortrak/Small bore NG tube Temperature Spikes Noted: Yes (100.9) Respiratory Status: Nasal cannula History of Recent Intubation: Yes Total duration of intubation (days): 2 days Date extubated: 12/30/22 Behavior/Cognition: Alert;Cooperative;Pleasant mood;Requires cueing Oral Cavity Assessment: Dried secretions Oral Care Completed by SLP: Yes Oral Cavity - Dentition: Edentulous Vision: Functional for self-feeding Self-Feeding Abilities: Total assist Patient Positioning: Upright in bed Baseline Vocal Quality: Low vocal intensity Volitional Cough: Cognitively unable to elicit Volitional Swallow: Unable to elicit    Oral/Motor/Sensory Function Overall Oral Motor/Sensory Function: Mild impairment Facial ROM: Suspected CN VII (facial) dysfunction;Reduced left Facial Symmetry: Suspected CN VII (facial) dysfunction;Abnormal symmetry left Facial Strength: Reduced left;Suspected CN VII (facial) dysfunction Lingual ROM: Within Functional Limits Lingual Symmetry: Within Functional Limits Lingual Strength: Within Functional Limits Velum: Within Functional Limits Mandible: Within Functional Limits   Ice Chips Ice chips: Within functional limits Presentation: Spoon   Thin Liquid Thin Liquid: Impaired Presentation: Spoon;Straw Pharyngeal  Phase Impairments: Multiple swallows    Nectar Thick Nectar Thick Liquid: Not tested   Honey Thick Honey Thick Liquid: Not tested   Puree Puree: Impaired Presentation: Spoon Pharyngeal Phase Impairments: Multiple swallows   Solid     Solid: Not tested      Mahala Menghini., M.A. CCC-SLP Acute Rehabilitation Services Office [phone]  Secure chat preferred  01/01/2023,1:07 PM

## 2023-01-01 NOTE — Progress Notes (Signed)
Patient takes vicodin at home for knee pain from arthritis, and is currently NPO. Will add PRN fentanyl until she is no longer NPO.   Ritta Slot, MD [facility] [phone]  If 7pm- 7am, please page neurology on call as listed in AMION.

## 2023-01-02 ENCOUNTER — Inpatient Hospital Stay (HOSPITAL_COMMUNITY): Payer: 59

## 2023-01-02 DIAGNOSIS — I63231 Cerebral infarction due to unspecified occlusion or stenosis of right carotid arteries: Secondary | ICD-10-CM | POA: Diagnosis not present

## 2023-01-02 LAB — BASIC METABOLIC PANEL
Anion gap: 4 — ABNORMAL LOW (ref 5–15)
BUN: 21 mg/dL (ref 8–23)
CO2: 26 mmol/L (ref 22–32)
Calcium: 8.7 mg/dL — ABNORMAL LOW (ref 8.9–10.3)
Chloride: 113 mmol/L — ABNORMAL HIGH (ref 98–111)
Creatinine, Ser: 0.59 mg/dL (ref 0.44–1.00)
GFR, Estimated: >60 mL/min (ref >60)
Glucose, Bld: 138 mg/dL — ABNORMAL HIGH (ref 70–99)
Potassium: 4.4 mmol/L (ref 3.5–5.1)
Sodium: 143 mmol/L (ref 135–145)

## 2023-01-02 LAB — PHOSPHORUS: Phosphorus: 3.4 mg/dL (ref 2.5–4.6)

## 2023-01-02 LAB — CBC
HCT: 34.1 % — ABNORMAL LOW (ref 36.0–46.0)
Hemoglobin: 10.7 g/dL — ABNORMAL LOW (ref 12.0–15.0)
MCH: 26.7 pg (ref 26.0–34.0)
MCHC: 31.4 g/dL (ref 30.0–36.0)
MCV: 85 fL (ref 80.0–100.0)
Platelets: 120 10*3/uL — ABNORMAL LOW (ref 150–400)
RBC: 4.01 MIL/uL (ref 3.87–5.11)
RDW: 14.5 % (ref 11.5–15.5)
WBC: 8.4 10*3/uL (ref 4.0–10.5)
nRBC: 0 % (ref 0.0–0.2)

## 2023-01-02 LAB — GLUCOSE, CAPILLARY
Glucose-Capillary: 104 mg/dL — ABNORMAL HIGH (ref 70–99)
Glucose-Capillary: 134 mg/dL — ABNORMAL HIGH (ref 70–99)
Glucose-Capillary: 148 mg/dL — ABNORMAL HIGH (ref 70–99)
Glucose-Capillary: 151 mg/dL — ABNORMAL HIGH (ref 70–99)
Glucose-Capillary: 152 mg/dL — ABNORMAL HIGH (ref 70–99)
Glucose-Capillary: 156 mg/dL — ABNORMAL HIGH (ref 70–99)
Glucose-Capillary: 159 mg/dL — ABNORMAL HIGH (ref 70–99)

## 2023-01-02 LAB — HEPARIN LEVEL (UNFRACTIONATED): Heparin Unfractionated: <0.1 [IU]/mL — ABNORMAL LOW (ref 0.30–0.70)

## 2023-01-02 LAB — APTT: aPTT: 29 s (ref 24–36)

## 2023-01-02 MED ORDER — HEPARIN (PORCINE) 25000 UT/250ML-% IV SOLN
1400.0000 [IU]/h | INTRAVENOUS | Status: DC
  Administered 2023-01-02: 900 [IU]/h via INTRAVENOUS
  Administered 2023-01-04: 1500 [IU]/h via INTRAVENOUS
  Administered 2023-01-05 (×2): 1650 [IU]/h via INTRAVENOUS
  Administered 2023-01-06: 1400 [IU]/h via INTRAVENOUS
  Filled 2023-01-02 (×6): qty 250

## 2023-01-02 NOTE — Progress Notes (Addendum)
PHARMACY - ANTICOAGULATION CONSULT NOTE  Pharmacy Consult for heparin  Indication: atrial fibrillation  No Known Allergies  Patient Measurements: Height: 5\' 3"  (160 cm) Weight: 65.5 kg (144 lb 6.4 oz) IBW/kg (Calculated) : 52.4 Heparin Dosing Weight: 65.5 kg  Vital Signs: Temp: 100.9 F (38.3 C) (12/02 1529) Temp Source: Axillary (12/02 1529) BP: 151/73 (12/02 1529) Pulse Rate: 80 (12/02 1529)  Labs: Recent Labs    12/31/22 0410 01/01/23 0550 01/02/23 0529 01/02/23 1802  HGB 11.7* 12.1 10.7*  --   HCT 36.5 37.5 34.1*  --   PLT 106* 99* 120*  --   APTT  --   --   --  29  HEPARINUNFRC  --   --   --  <0.10*  CREATININE 0.77 0.77 0.59  --     Estimated Creatinine Clearance: 43.4 mL/min (by C-G formula based on SCr of 0.59 mg/dL).   Medical History: Past Medical History:  Diagnosis Date   Arthritis of both knees 08/01/2015   Colitis    Essential hypertension, benign 07/28/2014   GERD (gastroesophageal reflux disease)    Hx of iron deficiency anemia 09/02/2015   Hyperlipidemia    Hypertension    Lumbar pain    Medicare annual wellness visit, subsequent 04/07/2017   OSA on CPAP 08/01/2015   Overweight (BMI 25.0-29.9) 07/10/2013   Prediabetes 09/02/2015   SOB (shortness of breath) 07/10/2013   Assessment: 81 YOF presenting with stroke 11/28. Patient with a PMH of atrial fibrillation on Eliquis PTA- LD unknown. Pharmacy consulted to dose heparin. Targeting lower end of therapeutic range given recent stroke.  Heparin level subtherapeutic (< 0.10) on 900 units/hr, therefore not being affected by home Eliquis usage. aPTT also subtherapeutic (29). No need to obtain further aPTTs. No issues with infusion or s/sx of bleeding per RN. Will increase infusion rate.  Goal of Therapy:  Heparin level 0.3-0.5  Monitor platelets by anticoagulation protocol: Yes   Plan:  Increase heparin infusion to 1050 units/hr Check HL in 8 hours  Assess heparin level and CBC daily  F/U restart  apixaban   Nicole Kindred, PharmD PGY1 Pharmacy Resident 01/02/2023 7:19 PM

## 2023-01-02 NOTE — Evaluation (Signed)
Modified Barium Swallow Study  Patient Details  Name: Jocelyn [name] MRN: [id number] Date of Birth: [birth year]  Today's Date: 01/02/2023  Modified Barium Swallow completed.  Full report located under Chart Review in the Imaging Section.  History of Present Illness 89 yo female presenting on 11/27 with L sided weakness and R MCA occlusion s/p IR revascularization with complicated arterial cannulation requiring multiple sticks. Intubated 11/28-11/29. PMH includes: GERD, HTN, CHF, AFIB RVR on eliquis, HLD, OSA   Clinical Impression Pt had significant oral holding during MBS that limited ability to assess pharyngeal swallow. This appeared to be quite increased from bedside trials (question related to taste of barium/environment in radiology) but could potentially be contributing to piecemeal swallowing/multiple swallows observed at bedside. Large volumes of anterior loss were noted and oral holding was quite prolonged even with multimodal cueing. Of the few times she swallowed, it was mostly liquids that had spilled anteriorly into her pharynx as opposed to initiation of lingual transit. She swallowed x1 with puree with no posterior transit of the bolus at all. Oral suction was used to remove it. When she does swallow, she has reduced base of tongue retraction, hyolaryngeal movement, and laryngeal vestibule closure. Homero Fellers penetration without clearance was noted with honey thick liquids and thin liquids (PAS 3). No penetration or aspiration was seen with nectar thick liquids but this could not be observed over multiple trials. Recommend that she remain NPO for now with ongoing trials clinically with SLP to assess with more challenging with POs.  Factors that may increase risk of adverse event in presence of aspiration [name] & [name] 2021): Reduced cognitive function;Limited mobility;Frail or deconditioned;Dependence for feeding and/or oral hygiene;Presence of tubes (ETT, trach, NG, etc.)  Swallow  Evaluation Recommendations Recommendations: NPO;Ice chips PRN after oral care (few pieces of ice with nursing after oral care) Medication Administration: Via alternative means Oral care recommendations: Oral care QID (4x/day);Oral care before ice chips/water Caregiver Recommendations: Have oral suction available      Mahala Menghini., M.A. CCC-SLP Acute Rehabilitation Services Office [phone]  Secure chat preferred  01/02/2023,12:46 PM

## 2023-01-02 NOTE — Plan of Care (Signed)
  Problem: Education: Goal: Knowledge of disease or condition will improve Outcome: Progressing   Problem: Health Behavior/Discharge Planning: Goal: Ability to manage health-related needs will improve Outcome: Progressing   Problem: Education: Goal: Knowledge of General Education information will improve Description: Including pain rating scale, medication(s)/side effects and non-pharmacologic comfort measures Outcome: Progressing   Problem: Clinical Measurements: Goal: Ability to maintain clinical measurements within normal limits will improve Outcome: Progressing Goal: Will remain free from infection Outcome: Progressing Goal: Diagnostic test results will improve Outcome: Progressing Goal: Respiratory complications will improve Outcome: Progressing Goal: Cardiovascular complication will be avoided Outcome: Progressing

## 2023-01-02 NOTE — Progress Notes (Addendum)
STROKE TEAM PROGRESS NOTE   BRIEF HPI [name] is a 89 y.o. female with history of  GERD, prediabetes, permanent afibb on eliquis, HTN, HLD, OSA, also has a history of atrial fibrillation on Eliquis and appears to have been compliant with her medications.  She presented today after her brother found her unresponsive and thought that she was not breathing.  However, upon arrival of EMS, she was moving her right side and attempting to talk, although she was confused.  She was noted to have left-sided weakness, and code stroke was activated.   CT shows no acute abnormalities, acute clot at the right ICA Bifurcation and acute obstructing right MCA M2 clot was seen on CT angiogram with large penumbra on CT perfusion.  NIH on Admission 19  SIGNIFICANT HOSPITAL EVENTS 11/28.  Admitted.  CT shows no acute abnormalities, acute clot at the right ICA Bifurcation and acute obstructing right MCA M2 clot was seen on CTA with large penumbra on CT perfusion.Underwent medical thrombectomy of right MCA occlusion 12/1: transferred out of ICU  INTERIM HISTORY/SUBJECTIVE No family at bedside.  Physical therapy at bedside.  Heparin gtt started.   Patient is alert, speech is mumbled, follows simple commands, dense left hemiplegia.   Discharge planning: SNF  OBJECTIVE  CBC    Component Value Date/Time   WBC 8.4 01/02/2023 0529   RBC 4.01 01/02/2023 0529   HGB 10.7 (L) 01/02/2023 0529   HGB 12.5 09/19/2022 0916   HCT 34.1 (L) 01/02/2023 0529   HCT 39.6 09/19/2022 0916   PLT 120 (L) 01/02/2023 0529   PLT 169 09/19/2022 0916   MCV 85.0 01/02/2023 0529   MCV 87 09/19/2022 0916   MCV 82 04/13/2011 1125   MCH 26.7 01/02/2023 0529   MCHC 31.4 01/02/2023 0529   RDW 14.5 01/02/2023 0529   RDW 12.9 09/19/2022 0916   RDW 14.4 04/13/2011 1125   LYMPHSABS 1.1 12/29/2022 1706   LYMPHSABS 3.7 (H) 07/08/2015 1136   MONOABS 0.2 12/29/2022 1706   EOSABS 0.0 12/29/2022 1706   EOSABS 0.2 07/08/2015  1136   BASOSABS 0.0 12/29/2022 1706   BASOSABS 0.1 07/08/2015 1136    BMET    Component Value Date/Time   NA 143 01/02/2023 0529   NA 143 12/07/2022 1535   NA 141 04/13/2011 1125   K 4.4 01/02/2023 0529   K 3.4 (L) 04/13/2011 1125   CL 113 (H) 01/02/2023 0529   CL 105 04/13/2011 1125   CO2 26 01/02/2023 0529   CO2 29 04/13/2011 1125   GLUCOSE 138 (H) 01/02/2023 0529   GLUCOSE 111 (H) 04/13/2011 1125   BUN 21 01/02/2023 0529   BUN 16 12/07/2022 1535   BUN 14 04/13/2011 1125   CREATININE 0.59 01/02/2023 0529   CREATININE 0.80 08/01/2018 1544   CALCIUM 8.7 (L) 01/02/2023 0529   CALCIUM 9.2 04/13/2011 1125   EGFR 74 12/07/2022 1535   GFRNONAA >60 01/02/2023 0529   GFRNONAA 67 08/01/2018 1544    IMAGING past 24 hours No results found.  Vitals:   01/02/23 0443 01/02/23 0543 01/02/23 0801 01/02/23 1144  BP: (!) 145/53  (!) 155/70 (!) 156/81  Pulse: 93  88 81  Resp: 15  18 19   Temp: 99.2 F (37.3 C)  98.4 F (36.9 C) 99.1 F (37.3 C)  TempSrc: Oral  Oral Oral  SpO2: 100%  99% 100%  Weight:  65.5 kg    Height:         PHYSICAL  EXAM General: Critically ill elderly African-American lady in no apparent distress Psych: Unable to assess CV: Irregularly irregular on monitor Respiratory:  Regular, unlabored respirations GI: Abdomen soft and nontender   NEURO:  Mental Status: Patient is more alert today.  She follows simple commands, and speech is mostly mumbled incomprehensible sounds.  Cranial Nerves:  II: PERRL.  No blink to threat on left III, IV, VI: EOMI -continued slight rightward gaze. Eyelids elevate symmetrically.  V: Sensation is intact to light touch and symmetrical to face.  VII: left facial droop VIII: hearing intact to voice. IX, X: Able to assess XI: Able to assess XII: tongue is midline without fasciculations. Motor:    Dense left hemiplegia with flaccid left arm. 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  ASPECTS 7   CTA head & neck acute clot at the right terminal ICA and acute obstructing right MCA M2 and ACA A1. CT perfusion 6/149 in the right MCA territory.  S/p IR via right ICA direct access with TICI3 MRI scattered acute right MCA distribution infarcts involving the right caudate and lentiform nuclei, right insula, and overlying right cerebral hemisphere, small volume petechial hemorrhage at the posterior right frontal region MRA  with interval revascularization of previously seen clot at the right ICA terminus and right M2 segment, Focal moderate distal right P2 stenosis. 2D Echo EF 60 to 65%.  LV severe concentric hypertrophy.  LA severely dilated LDL 33 HgbA1c 6.4 VTE prophylaxis - Heparin IV Eliquis prior to admission, now on heparin IV. Therapy recommendations: SNF Disposition: Pending  Atrial fibrillation Home Meds: Eliquis On tele Rate controlled On aspirin 325 mg Now on heparin IV.  ? Cerebral aneurysm MRA head 3-4 mm outpouching arising from the supraclinoid left ICA, suspicious for a left PCOM aneurysm CTA head did not report aneurysm Yearly MRA follow up as outpt  Hypertensive Emergency Home meds: Imdur 120 mg, olmesartan-amlodipine-HCTZ 40-10-12.5 mg, spironolactone 25 mg Unstable On Cleviprex drip and wean as tolerated Restarted home Lasix and Avapro 150 mg daily, spironolactone 12.5 Blood Pressure Goal: SBP 120-160 for first 24 hours then less than 180   Hyperlipidemia Home meds: Lipitor 40 mg, resumed in hospital LDL 33,  goal < 70 Continue statin at discharge  Acute respiratory failure post procedure Hypoxia  Concern for Aspiration Pneumonia  Extubated on 11/29 CXR 11/30: left lung opacification, likely mucous plug. 40mg  Lasix x 1 on 11/30  Continue ceftriaxone for 7 days, started 11/30  Dysphagia Patient has post-stroke dysphagia, SLP consulted Continue NPO Core track tube in place  TF's running Advance diet as tolerated  Other Stroke Risk Factors  Obstructive sleep apnea,  on CPAP at home  Advanced age  Other Active Problems Anemia-Hgb 11.5-> 10.7.  No active signs of bleeding will monitor Hypophosphatemia being replaced  Hospital day # 4   Pt seen by Neuro NP/APP and later by MD. Note/plan to be edited by MD as needed.    Lynnae January, DNP, AGACNP-BC [facility] Please use AMION for contact information & EPIC for  messaging.  Stroke MD Note : I have personally obtained history,examined this patient, reviewed notes, independently viewed imaging studies, participated in medical decision making and plan of care.ROS completed by me personally and pertinent positives fully documented  I have made any additions or clarifications directly to the above note. Agree with note above.  Patient is more alert and follows occasional simple commands but continues to have significant dense hemiplegia.  No family at the bedside.  Family wants to continue supportive care for now.  She will likely need to go to skilled nursing facility for rehabilitation.  Speech therapy to continue evaluation for dysphagia and she will need either a PEG tube or should be able to swallow before transfer to skilled nursing facility.    Delia Heady, MD Medical Director [facility] Pager: [phone] 01/02/2023 4:32 PM     To contact Stroke Continuity provider, please refer to [email or web address]. After hours, contact General Neurology

## 2023-01-02 NOTE — Progress Notes (Signed)
Speech Language Pathology Treatment: Dysphagia  Patient Details Name: Jocelyn [name] MRN: [id number] DOB: [birth year] Today's Date: 01/02/2023 Time: 4098-1191 SLP Time Calculation (min) (ACUTE ONLY): 12 min  Assessment / Plan / Recommendation Clinical Impression  Pt is alert this morning, still overall with delayed responses but not as long as on previous date. She has oral holding with thicker consistencies, for which SLP provided Min cues. Additional signs of dysphagia are still noted, including 5 or more subswallows per thin liquid bolus (reduced to ~3 with thicker liquids) and intermittent throat clearing. Recommend proceeding with MBS to better evaluate oropharyngeal swallow, tentatively scheduled with radiology later today.    HPI HPI: 89 yo female presenting on 11/27 with L sided weakness and R MCA occlusion s/p IR revascularization with complicated arterial cannulation requiring multiple sticks. Intubated 11/28-11/29. PMH includes: GERD, HTN, CHF, AFIB RVR on eliquis, HLD, OSA      SLP Plan  MBS      Recommendations for follow up therapy are one component of a multi-disciplinary discharge planning process, led by the attending physician.  Recommendations may be updated based on patient status, additional functional criteria and insurance authorization.    Recommendations  Diet recommendations: NPO;Other(comment) (few ice chips after oral care from nurse) Medication Administration: Via alternative means                  Oral care QID;Oral care prior to ice chip/H20   Frequent or constant Supervision/Assistance Dysphagia, unspecified (R13.10)     MBS     Mahala Menghini., M.A. CCC-SLP Acute Rehabilitation Services Office [phone]  Secure chat preferred   01/02/2023, 9:40 AM

## 2023-01-02 NOTE — Progress Notes (Signed)
PHARMACY - ANTICOAGULATION CONSULT NOTE  Pharmacy Consult for heparin  Indication: atrial fibrillation  No Known Allergies  Patient Measurements: Height: 5\' 3"  (160 cm) Weight: 65.5 kg (144 lb 6.4 oz) IBW/kg (Calculated) : 52.4 Heparin Dosing Weight: 65.5  Vital Signs: Temp: 98.4 F (36.9 C) (12/02 0801) Temp Source: Oral (12/02 0801) BP: 155/70 (12/02 0801) Pulse Rate: 88 (12/02 0801)  Labs: Recent Labs    12/31/22 0410 01/01/23 0550 01/02/23 0529  HGB 11.7* 12.1 10.7*  HCT 36.5 37.5 34.1*  PLT 106* 99* 120*  CREATININE 0.77 0.77 0.59    Estimated Creatinine Clearance: 43.4 mL/min (by C-G formula based on SCr of 0.59 mg/dL).   Medical History: Past Medical History:  Diagnosis Date   Arthritis of both knees 08/01/2015   Colitis    Essential hypertension, benign 07/28/2014   GERD (gastroesophageal reflux disease)    Hx of iron deficiency anemia 09/02/2015   Hyperlipidemia    Hypertension    Lumbar pain    Medicare annual wellness visit, subsequent 04/07/2017   OSA on CPAP 08/01/2015   Overweight (BMI 25.0-29.9) 07/10/2013   Prediabetes 09/02/2015   SOB (shortness of breath) 07/10/2013    Assessment: 57 YOF presenting with stroke 11/28. Patient with a PMH of atrial fibrillation on Eliquis PTA- LD unknown. Pharmacy consulted to dose heparin.   Will not bolus given recent stroke, and will target lower end of therapeutic range. Will assess aPTT with initial heparin level to confirm heparin level is not being influenced by apixaban usage.   Goal of Therapy:  Heparin level 0.3-0.5  Monitor platelets by anticoagulation protocol: Yes   Plan:  Start heparin infusion at 900 units/hr- no bolus  Check anti-Xa and aPTT in 8 hours  Assess heparin level and CBC daily  F/U restart apixaban   Jani Gravel, PharmD Clinical Pharmacist  01/02/2023 9:35 AM

## 2023-01-02 NOTE — Progress Notes (Signed)
Physical Therapy Treatment Patient Details Name: Jocelyn [name] MRN: [id number] DOB: 1931/07/24 Today's Date: 01/02/2023   History of Present Illness 89 yo female on 11/27 L sided weakness and R MCA occlusion intubated 11/28 IR revascularization complicated arterial cannulation requiring multiple sticks, extubated 11/29.  PMH HTN CHF AFIB RVR on eliquis HLD OSA.    PT Comments  Patient awake and alert on arrival. Following simple commands with RUE and RLE. No AROM left extremities noted. Progressed to sitting EOB with CGA and single UE support. Standing with +2 mod assist without achieving full upright (knees slightly flexed and trunk in forward flexion).     If plan is discharge home, recommend the following: Two people to help with walking and/or transfers;Direct supervision/assist for medications management;Assist for transportation;Two people to help with bathing/dressing/bathroom;Assistance with cooking/housework;Assistance with feeding   Can travel by private vehicle     No  Equipment Recommendations  None recommended by PT    Recommendations for Other Services       Precautions / Restrictions Precautions Precautions: Fall Restrictions Weight Bearing Restrictions: No     Mobility  Bed Mobility Overal bed mobility: Needs Assistance Bed Mobility: Rolling, Sidelying to Sit, Sit to Sidelying Rolling: Max assist, Used rails Sidelying to sit: Max assist, HOB elevated, Used rails     Sit to sidelying: Mod assist, +2 for physical assistance General bed mobility comments: roll to rt, assist legs over EOB and raise torso    Transfers Overall transfer level: Needs assistance Equipment used: 2 person hand held assist Transfers: Sit to/from Stand Sit to Stand: Mod assist, +2 physical assistance           General transfer comment: x 2 reps, neither leg fully extended in standing, hips also flexed with forward trunk    Ambulation/Gait                    Stairs             Wheelchair Mobility     Tilt Bed    Modified Rankin (Stroke Patients Only) Modified Rankin (Stroke Patients Only) Pre-Morbid Rankin Score: Slight disability (per RNCM note pt living alone) Modified Rankin: Severe disability     Balance Overall balance assessment: Needs assistance Sitting-balance support: Single extremity supported, Feet supported Sitting balance-Leahy Scale: Fair Sitting balance - Comments: once assisted to sit EOB, able to maintain midline with light to no support of UE   Standing balance support: Bilateral upper extremity supported Standing balance-Leahy Scale: Poor                              Cognition Arousal: Alert Behavior During Therapy: Flat affect Overall Cognitive Status: Difficult to assess                                 General Comments: following simple commands with rt extremities; non-sensical speech/unintelligible        Exercises Other Exercises Other Exercises: PROM of bil UE shoulder elbow wrist and digits. edema noted bil with L UE    General Comments        Pertinent Vitals/Pain Pain Assessment Pain Assessment: Faces Faces Pain Scale: No hurt    Home Living                          Prior Function  PT Goals (current goals can now be found in the care plan section) Acute Rehab PT Goals Patient Stated Goal: none stated; difficult to understand Time For Goal Achievement: 01/13/23 Potential to Achieve Goals: Fair Progress towards PT goals: Progressing toward goals    Frequency    Min 1X/week      PT Plan      Co-evaluation              AM-PAC PT "6 Clicks" Mobility   Outcome Measure  Help needed turning from your back to your side while in a flat bed without using bedrails?: Total Help needed moving from lying on your back to sitting on the side of a flat bed without using bedrails?: Total Help needed moving to and from a  bed to a chair (including a wheelchair)?: Total Help needed standing up from a chair using your arms (e.g., wheelchair or bedside chair)?: Total Help needed to walk in hospital room?: Total Help needed climbing 3-5 steps with a railing? : Total 6 Click Score: 6    End of Session Equipment Utilized During Treatment: Gait belt Activity Tolerance: Patient tolerated treatment well Patient left: in bed;with bed alarm set   PT Visit Diagnosis: Other abnormalities of gait and mobility (R26.89);Hemiplegia and hemiparesis;Other symptoms and signs involving the nervous system (R29.898) Hemiplegia - Right/Left: Left Hemiplegia - dominant/non-dominant: Non-dominant Hemiplegia - caused by: Cerebral infarction     Time: 1308-6578 PT Time Calculation (min) (ACUTE ONLY): 23 min  Charges:    $Neuromuscular Re-education: 23-37 mins PT General Charges $$ ACUTE PT VISIT: 1 Visit                      Jerolyn Center, PT Acute Rehabilitation Services  Office [phone]    Zena Amos 01/02/2023, 11:28 AM

## 2023-01-03 ENCOUNTER — Telehealth: Payer: Self-pay | Admitting: Student in an Organized Health Care Education/Training Program

## 2023-01-03 DIAGNOSIS — I63231 Cerebral infarction due to unspecified occlusion or stenosis of right carotid arteries: Secondary | ICD-10-CM | POA: Diagnosis not present

## 2023-01-03 LAB — GLUCOSE, CAPILLARY
Glucose-Capillary: 151 mg/dL — ABNORMAL HIGH (ref 70–99)
Glucose-Capillary: 145 mg/dL — ABNORMAL HIGH (ref 70–99)
Glucose-Capillary: 116 mg/dL — ABNORMAL HIGH (ref 70–99)
Glucose-Capillary: 136 mg/dL — ABNORMAL HIGH (ref 70–99)

## 2023-01-03 LAB — BASIC METABOLIC PANEL
Anion gap: 8 (ref 5–15)
BUN: 20 mg/dL (ref 8–23)
CO2: 27 mmol/L (ref 22–32)
Calcium: 9.1 mg/dL (ref 8.9–10.3)
Chloride: 108 mmol/L (ref 98–111)
Creatinine, Ser: 0.61 mg/dL (ref 0.44–1.00)
GFR, Estimated: >60 mL/min (ref >60)
Glucose, Bld: 160 mg/dL — ABNORMAL HIGH (ref 70–99)
Potassium: 4.4 mmol/L (ref 3.5–5.1)
Sodium: 143 mmol/L (ref 135–145)

## 2023-01-03 LAB — HEPARIN LEVEL (UNFRACTIONATED)
Heparin Unfractionated: <0.1 [IU]/mL — ABNORMAL LOW (ref 0.30–0.70)
Heparin Unfractionated: <0.1 [IU]/mL — ABNORMAL LOW (ref 0.30–0.70)

## 2023-01-03 NOTE — Care Management Important Message (Signed)
Important Message  Patient Details  Name: ALYESE LAROY MRN: [id number] Date of Birth: 03-Jun-1931   Important Message Given:  Yes - Medicare IM     Dorena Bodo 01/03/2023, 4:08 PM

## 2023-01-03 NOTE — Progress Notes (Signed)
PHARMACY - ANTICOAGULATION CONSULT NOTE  Pharmacy Consult for heparin  Indication: atrial fibrillation  No Known Allergies  Patient Measurements: Height: 5\' 3"  (160 cm) Weight: 68.3 kg (150 lb 9.2 oz) IBW/kg (Calculated) : 52.4 Heparin Dosing Weight: 65.5  Vital Signs: Temp: 98.6 F (37 C) (12/03 1200) Temp Source: Oral (12/03 1200) BP: 170/85 (12/03 1200) Pulse Rate: 94 (12/03 1200)  Labs: Recent Labs    01/01/23 0550 01/02/23 0529 01/02/23 1802 01/03/23 0443 01/03/23 1425  HGB 12.1 10.7*  --   --   --   HCT 37.5 34.1*  --   --   --   PLT 99* 120*  --   --   --   APTT  --   --  29  --   --   HEPARINUNFRC  --   --  <0.10* <0.10* <0.10*  CREATININE 0.77 0.59  --  0.61  --     Estimated Creatinine Clearance: 44.3 mL/min (by C-G formula based on SCr of 0.61 mg/dL).   Medical History: Past Medical History:  Diagnosis Date   Arthritis of both knees 08/01/2015   Colitis    Essential hypertension, benign 07/28/2014   GERD (gastroesophageal reflux disease)    Hx of iron deficiency anemia 09/02/2015   Hyperlipidemia    Hypertension    Lumbar pain    Medicare annual wellness visit, subsequent 04/07/2017   OSA on CPAP 08/01/2015   Overweight (BMI 25.0-29.9) 07/10/2013   Prediabetes 09/02/2015   SOB (shortness of breath) 07/10/2013    Assessment: 38 YOF presenting with stroke 11/28. Patient with a PMH of atrial fibrillation on Eliquis PTA- LD unknown. Pharmacy consulted to dose heparin.   Will not bolus given recent stroke, and will target lower end of therapeutic range. Will assess aPTT with initial heparin level to confirm heparin level is not being influenced by apixaban usage.   12/3: Heparin level repeatedly undetectable, now with heparin running at 1200 units/hour. No signs of bleeding or issues with the heparin infusion noted.   Goal of Therapy:  Heparin level 0.3-0.5  Monitor platelets by anticoagulation protocol: Yes   Plan:  Increase heparin rate to 1350  units/hour - no bolus  Check anti-Xa and aPTT in 8 hours  Assess heparin level and CBC daily  F/U restart apixaban   Jani Gravel, PharmD Clinical Pharmacist  01/03/2023 3:23 PM

## 2023-01-03 NOTE — Progress Notes (Addendum)
STROKE TEAM PROGRESS NOTE   BRIEF HPI [name] is a 89 y.o. female with history of  GERD, prediabetes, permanent afibb on eliquis, HTN, HLD, OSA, also has a history of atrial fibrillation on Eliquis and appears to have been compliant with her medications.  She presented today after her brother found her unresponsive and thought that she was not breathing.  However, upon arrival of EMS, she was moving her right side and attempting to talk, although she was confused.  She was noted to have left-sided weakness, and code stroke was activated.   CT shows no acute abnormalities, acute clot at the right ICA Bifurcation and acute obstructing right MCA M2 clot was seen on CT angiogram with large penumbra on CT perfusion.  NIH on Admission 19  SIGNIFICANT HOSPITAL EVENTS 11/28.  Admitted.  CT shows no acute abnormalities, acute clot at the right ICA Bifurcation and acute obstructing right MCA M2 clot was seen on CTA with large penumbra on CT perfusion.Underwent medical thrombectomy of right MCA occlusion 12/1: transferred out of ICU  INTERIM HISTORY/SUBJECTIVE Granddaughter at bedside.  Patient's daughter on phone, family updated on plan of care and assessment.  Preliminary plan to see how speech eval goes tomorrow and then discussed possibility of placement of PEG tube in preparation for discharge to SNF. Patient seems slightly more awake and interactive today.  Discharge planning: SNF  OBJECTIVE  CBC    Component Value Date/Time   WBC 8.4 01/02/2023 0529   RBC 4.01 01/02/2023 0529   HGB 10.7 (L) 01/02/2023 0529   HGB 12.5 09/19/2022 0916   HCT 34.1 (L) 01/02/2023 0529   HCT 39.6 09/19/2022 0916   PLT 120 (L) 01/02/2023 0529   PLT 169 09/19/2022 0916   MCV 85.0 01/02/2023 0529   MCV 87 09/19/2022 0916   MCV 82 04/13/2011 1125   MCH 26.7 01/02/2023 0529   MCHC 31.4 01/02/2023 0529   RDW 14.5 01/02/2023 0529   RDW 12.9 09/19/2022 0916   RDW 14.4 04/13/2011 1125   LYMPHSABS 1.1  12/29/2022 1706   LYMPHSABS 3.7 (H) 07/08/2015 1136   MONOABS 0.2 12/29/2022 1706   EOSABS 0.0 12/29/2022 1706   EOSABS 0.2 07/08/2015 1136   BASOSABS 0.0 12/29/2022 1706   BASOSABS 0.1 07/08/2015 1136    BMET    Component Value Date/Time   NA 143 01/03/2023 0443   NA 143 12/07/2022 1535   NA 141 04/13/2011 1125   K 4.4 01/03/2023 0443   K 3.4 (L) 04/13/2011 1125   CL 108 01/03/2023 0443   CL 105 04/13/2011 1125   CO2 27 01/03/2023 0443   CO2 29 04/13/2011 1125   GLUCOSE 160 (H) 01/03/2023 0443   GLUCOSE 111 (H) 04/13/2011 1125   BUN 20 01/03/2023 0443   BUN 16 12/07/2022 1535   BUN 14 04/13/2011 1125   CREATININE 0.61 01/03/2023 0443   CREATININE 0.80 08/01/2018 1544   CALCIUM 9.1 01/03/2023 0443   CALCIUM 9.2 04/13/2011 1125   EGFR 74 12/07/2022 1535   GFRNONAA >60 01/03/2023 0443   GFRNONAA 67 08/01/2018 1544    IMAGING past 24 hours No results found.  Vitals:   01/03/23 0751 01/03/23 0848 01/03/23 1200 01/03/23 1545  BP: (!) 177/78 (!) 174/89 (!) 170/85 (!) 175/81  Pulse: 77 79 94 98  Resp: 20 18 20    Temp: 97.9 F (36.6 C) 98.9 F (37.2 C) 98.6 F (37 C) 98.3 F (36.8 C)  TempSrc: Oral Oral Oral Oral  SpO2: 100% 100% 100% 100%  Weight:      Height:         PHYSICAL EXAM General: Critically ill elderly African-American lady in no apparent distress Psych: Unable to assess CV: Irregularly irregular on monitor Respiratory:  Regular, unlabored respirations GI: Abdomen soft and nontender   NEURO:  Mental Status: More alert and interactive today.  She follows simple commands, and speech is mostly mumbled incomprehensible sounds.  Cranial Nerves:  II: PERRL.  No blink to threat on left III, IV, VI: EOMI: continued slight rightward gaze. Eyelids elevate symmetrically.  V: Sensation is intact to light touch and symmetrical to face.  VII: left facial droop VIII: hearing intact to voice. IX, X: Able to assess XI: Able to assess XII: tongue is midline  without fasciculations. Motor:    Dense left hemiplegia with flaccid left arm.  Was able to slightly wiggle left toes.  Spontaneous movement against resistance 4/5 of right arm, against gravity 4/5 movement of right leg Tone: is normal and bulk is normal Sensation-decreased sensation on left  Coordination: Unable to assess Gait- deferred  Most Recent NIH: 18  ASSESSMENT/PLAN  Acute Ischemic Infarct:  right MCA territory infarct with R M2 and A1 occlusion s/p IR with TICI 3 revascularization., etiology: Cardioembolic in the setting of A-fib Code Stroke  CT head  -hyperdense right MCA, cytotoxic edema in right stratum and insula.  ASPECTS 7   CTA head & neck acute clot at the right terminal ICA and acute obstructing right MCA M2 and ACA A1. CT perfusion 6/149 in the right MCA territory.  S/p IR via right ICA direct access with TICI3 MRI scattered acute right MCA distribution infarcts involving the right caudate and lentiform nuclei, right insula, and overlying right cerebral hemisphere, small volume petechial hemorrhage at the posterior right frontal region MRA  with interval revascularization of previously seen clot at the right ICA terminus and right M2 segment, Focal moderate distal right P2 stenosis. 2D Echo EF 60 to 65%.  LV severe concentric hypertrophy.  LA severely dilated LDL 33 HgbA1c 6.4 VTE prophylaxis - Heparin IV Eliquis prior to admission, continue heparin IV. Therapy recommendations: SNF Disposition: Pending  Atrial fibrillation Home Meds: Eliquis On tele Rate controlled Continue heparin IV.  ? Cerebral aneurysm MRA head 3-4 mm outpouching arising from the supraclinoid left ICA, suspicious for a left PCOM aneurysm CTA head did not report aneurysm Yearly MRA follow up as outpt  Hypertensive Emergency Home meds: Imdur 120 mg, olmesartan-amlodipine-HCTZ 40-10-12.5 mg, spironolactone 25 mg Unstable On Cleviprex drip and wean as tolerated Restarted home Lasix and  Avapro 150 mg daily, spironolactone 12.5 Blood Pressure Goal: SBP 120-160 for first 24 hours then less than 180   Hyperlipidemia Home meds: Lipitor 40 mg, resumed in hospital LDL 33, goal < 70 Continue statin at discharge  Acute respiratory failure post procedure Hypoxia  Concern for Aspiration Pneumonia  Extubated on 11/29 CXR 11/30: left lung opacification, likely mucous plug. 40mg  Lasix x 1 on 11/30  Continue ceftriaxone for 7 days, started 11/30  Dysphagia Patient has post-stroke dysphagia, SLP consulted Continue NPO Core track tube in place  Continue tube feedings Advance diet as tolerated  Other Stroke Risk Factors  Obstructive sleep apnea,  on CPAP at home  Advanced age   Hospital day # 5   Pt seen by Neuro NP/APP and later by MD. Note/plan to be edited by MD as needed.    Lynnae January, DNP, AGACNP-BC [facility]  Please use AMION for contact information & EPIC for messaging.  I have personally obtained history,examined this patient, reviewed notes, independently viewed imaging studies, participated in medical decision making and plan of care.ROS completed by me personally and pertinent positives fully documented  I have made any additions or clarifications directly to the above note. Agree with note above.  Patient is more alert and interactive but continues to have significant left hemiplegia and dysphagia.  Continue tube feedings.  Patient may need PEG tube unless swallow function improves soon.  Granddaughter aware and wants swallow eval prior to considering PEG tube in the next few days.  Greater than 50% time during this 35-minute visit was spent on counseling and coordination of care about her stroke and dysphagia discussion with patient and family and care team and answering questions  Delia Heady, MD Medical Director [facility] Pager: [phone] 01/03/2023 7:25 PM    To contact Stroke Continuity provider, please refer to  [email or web address]. After hours, contact General Neurology

## 2023-01-03 NOTE — Telephone Encounter (Signed)
PT daughter call stated that patient had a stroke. FYI

## 2023-01-03 NOTE — Progress Notes (Signed)
PHARMACY - ANTICOAGULATION CONSULT NOTE  Pharmacy Consult for heparin  Indication: atrial fibrillation  No Known Allergies  Patient Measurements: Height: 5\' 3"  (160 cm) Weight: 68.3 kg (150 lb 9.2 oz) IBW/kg (Calculated) : 52.4 Heparin Dosing Weight: 65.5 kg  Vital Signs: Temp: 98.9 F (37.2 C) (12/03 0342) Temp Source: Oral (12/02 2355) BP: 161/71 (12/03 0342) Pulse Rate: 80 (12/03 0342)  Labs: Recent Labs    01/01/23 0550 01/02/23 0529 01/02/23 1802 01/03/23 0443  HGB 12.1 10.7*  --   --   HCT 37.5 34.1*  --   --   PLT 99* 120*  --   --   APTT  --   --  29  --   HEPARINUNFRC  --   --  <0.10* <0.10*  CREATININE 0.77 0.59  --  0.61    Estimated Creatinine Clearance: 44.3 mL/min (by C-G formula based on SCr of 0.61 mg/dL).   Medical History: Past Medical History:  Diagnosis Date   Arthritis of both knees 08/01/2015   Colitis    Essential hypertension, benign 07/28/2014   GERD (gastroesophageal reflux disease)    Hx of iron deficiency anemia 09/02/2015   Hyperlipidemia    Hypertension    Lumbar pain    Medicare annual wellness visit, subsequent 04/07/2017   OSA on CPAP 08/01/2015   Overweight (BMI 25.0-29.9) 07/10/2013   Prediabetes 09/02/2015   SOB (shortness of breath) 07/10/2013   Assessment: 75 YOF presenting with stroke 11/28. Patient with a PMH of atrial fibrillation on Eliquis PTA- LD unknown. Pharmacy consulted to dose heparin. Targeting lower end of therapeutic range given recent stroke.  Heparin level subtherapeutic (< 0.10) on 900 units/hr, therefore not being affected by home Eliquis usage. aPTT also subtherapeutic (29). No need to obtain further aPTTs. No issues with infusion or s/sx of bleeding per RN. Will increase infusion rate.  12/3 AM update:  Heparin level sub-therapeutic   Goal of Therapy:  Heparin level 0.3-0.5  Monitor platelets by anticoagulation protocol: Yes   Plan:  Increase heparin infusion to 1200 units/hr Check heparin level in 8  hours  Assess heparin level and CBC daily  F/U restart apixaban   Abran Duke, PharmD, BCPS Clinical Pharmacist Phone: [phone]

## 2023-01-03 NOTE — Plan of Care (Signed)
  Problem: Ischemic Stroke/TIA Tissue Perfusion: Goal: Complications of ischemic stroke/TIA will be minimized Outcome: Progressing   Problem: Clinical Measurements: Goal: Ability to maintain clinical measurements within normal limits will improve Outcome: Progressing Goal: Will remain free from infection Outcome: Progressing Goal: Diagnostic test results will improve Outcome: Progressing Goal: Respiratory complications will improve Outcome: Progressing Goal: Cardiovascular complication will be avoided Outcome: Progressing   Problem: Nutrition: Goal: Adequate nutrition will be maintained Outcome: Progressing   Problem: Coping: Goal: Level of anxiety will decrease Outcome: Progressing   Problem: Elimination: Goal: Will not experience complications related to bowel motility Outcome: Progressing Goal: Will not experience complications related to urinary retention Outcome: Progressing   Problem: Pain Management: Goal: General experience of comfort will improve Outcome: Progressing   Problem: Safety: Goal: Ability to remain free from injury will improve Outcome: Progressing   Problem: Education: Goal: Knowledge of disease or condition will improve Outcome: Not Progressing   Problem: Nutrition: Goal: Risk of aspiration will decrease Outcome: Not Progressing Goal: Dietary intake will improve Outcome: Not Progressing   Problem: Education: Goal: Knowledge of General Education information will improve Description: Including pain rating scale, medication(s)/side effects and non-pharmacologic comfort measures Outcome: Not Progressing   Problem: Health Behavior/Discharge Planning: Goal: Ability to manage health-related needs will improve Outcome: Not Progressing   Problem: Activity: Goal: Risk for activity intolerance will decrease Outcome: Not Progressing

## 2023-01-03 NOTE — Progress Notes (Signed)
Occupational Therapy Treatment Patient Details Name: Jocelyn [name] MRN: [id number] DOB: 02-11-1931 Today's Date: 01/03/2023   History of present illness 89 yo female on 11/27 L sided weakness and R MCA occlusion intubated 11/28 IR revascularization complicated arterial cannulation requiring multiple sticks, extubated 11/29.  PMH HTN CHF AFIB RVR on eliquis HLD OSA.   OT comments  Pt making steady progress towards OT goals this session. Pt continues to present with impaired balance, decreased activity tolerance and decreased strength/endurance . Session focus on functional mobility as precursor to higher level ADLs. Pt currently requires MAX A +2 for bed mobility and functional sit>stands from EOB. Increased tone noted to be developing in L elbow however difficult to fully assess d/t pain. Education provided on importance of PROM and positioning to LUE. Pt would continue to benefit from skilled occupational therapy while admitted and after d/c to address the below listed limitations in order to improve overall functional mobility and facilitate independence with BADL participation. DC plan remains appropriate, will follow acutely per POC.         If plan is discharge home, recommend the following:  Two people to help with bathing/dressing/bathroom   Equipment Recommendations  Wheelchair (measurements OT);Wheelchair cushion (measurements OT);Hospital bed;Hoyer lift    Recommendations for Other Services      Precautions / Restrictions Precautions Precautions: Fall Precaution Comments: watch O2 and BP, limit cervical rotation Restrictions Weight Bearing Restrictions: No       Mobility Bed Mobility   Bed Mobility: Supine to Sit, Sit to Supine     Supine to sit: Max assist, +2 for physical assistance Sit to supine: Max assist, +2 for physical assistance   General bed mobility comments: assist to maneuver BLEs and elevate/lower trunk    Transfers Overall transfer level: Needs  assistance Equipment used: 2 person hand held assist Transfers: Sit to/from Stand Sit to Stand: Max assist, +2 physical assistance, +2 safety/equipment           General transfer comment: pt completed x2 stands from elevated EOB with pt needing use of momentum to transition into partial stand, decreased ability to fully elevate trunk, LLE blocked during stands and LUE supported     Balance Overall balance assessment: Needs assistance Sitting-balance support: Single extremity supported, Feet supported Sitting balance-Leahy Scale: Poor Sitting balance - Comments: lateral lean to L side, pt was able to prop onto R elbow and maintain static balance with CGA   Standing balance support: Bilateral upper extremity supported Standing balance-Leahy Scale: Poor                             ADL either performed or assessed with clinical judgement   ADL Overall ADL's : Needs assistance/impaired                 Upper Body Dressing : Total assistance;Bed level Upper Body Dressing Details (indicate cue type and reason): to don new gown     Toilet Transfer: Maximal assistance;+2 for physical assistance Toilet Transfer Details (indicate cue type and reason): sit>stand only         Functional mobility during ADLs: Maximal assistance;+2 for physical assistance;+2 for safety/equipment;Cueing for safety;Cueing for sequencing General ADL Comments: ADL participation impacted by impaired balance, decreased activty tolerance and impaired strength    Extremity/Trunk Assessment Upper Extremity Assessment Upper Extremity Assessment: LUE deficits/detail LUE: Unable to fully assess due to pain (pain in L elbow, appears to be increased tone)  LUE Sensation: decreased light touch;decreased proprioception LUE Coordination: decreased fine motor;decreased gross motor   Lower Extremity Assessment Lower Extremity Assessment: Defer to PT evaluation   Cervical / Trunk Assessment Cervical /  Trunk Assessment: Kyphotic    Vision   Vision Assessment?: Vision impaired- to be further tested in functional context   Perception Perception Perception: Impaired   Praxis Praxis Praxis: Impaired    Cognition Arousal: Alert Behavior During Therapy: Flat affect, WFL for tasks assessed/performed Overall Cognitive Status: Difficult to assess                                 General Comments: following one step commands with increased time but overall slow to respond and difficult to understand        Exercises      Shoulder Instructions       General Comments pts daughter present during session, pt on 2L [place] with SPO2 100%    Pertinent Vitals/ Pain       Pain Assessment Pain Assessment: Faces Faces Pain Scale: Hurts little more Pain Location: L elbow Pain Descriptors / Indicators: Grimacing Pain Intervention(s): Limited activity within patient's tolerance, Monitored during session, Repositioned  Home Living                                          Prior Functioning/Environment              Frequency  Min 1X/week        Progress Toward Goals  OT Goals(current goals can now be found in the care plan section)  Progress towards OT goals: Progressing toward goals (gradually)  Acute Rehab OT Goals OT Goal Formulation: Patient unable to participate in goal setting Time For Goal Achievement: 01/13/23 Potential to Achieve Goals: Fair  Plan      Co-evaluation                 AM-PAC OT "6 Clicks" Daily Activity     Outcome Measure   Help from another person eating meals?: Total Help from another person taking care of personal grooming?: A Lot Help from another person toileting, which includes using toliet, bedpan, or urinal?: Total Help from another person bathing (including washing, rinsing, drying)?: Total Help from another person to put on and taking off regular upper body clothing?: Total Help from another person  to put on and taking off regular lower body clothing?: Total 6 Click Score: 7    End of Session Equipment Utilized During Treatment: Gait belt;Oxygen;Other (comment) (2L)  OT Visit Diagnosis: Unsteadiness on feet (R26.81);Muscle weakness (generalized) (M62.81)   Activity Tolerance Patient tolerated treatment well   Patient Left in bed;with call bell/phone within reach;with bed alarm set;with nursing/sitter in room   Nurse Communication Mobility status        Time: 1206-1229 OT Time Calculation (min): 23 min  Charges: OT General Charges $OT Visit: 1 Visit OT Treatments $Self Care/Home Management : 8-22 mins  Lenor Derrick., COTA/L Acute Rehabilitation Services [phone]   Jocelyn [name] 01/03/2023, 1:40 PM

## 2023-01-04 ENCOUNTER — Ambulatory Visit (HOSPITAL_COMMUNITY): Payer: 59

## 2023-01-04 DIAGNOSIS — I639 Cerebral infarction, unspecified: Secondary | ICD-10-CM | POA: Diagnosis not present

## 2023-01-04 LAB — BASIC METABOLIC PANEL
Anion gap: 8 (ref 5–15)
BUN: 19 mg/dL (ref 8–23)
CO2: 27 mmol/L (ref 22–32)
Calcium: 9.4 mg/dL (ref 8.9–10.3)
Chloride: 104 mmol/L (ref 98–111)
Creatinine, Ser: 0.64 mg/dL (ref 0.44–1.00)
GFR, Estimated: >60 mL/min (ref >60)
Glucose, Bld: 122 mg/dL — ABNORMAL HIGH (ref 70–99)
Potassium: 4.9 mmol/L (ref 3.5–5.1)
Sodium: 139 mmol/L (ref 135–145)

## 2023-01-04 LAB — GLUCOSE, CAPILLARY
Glucose-Capillary: 115 mg/dL — ABNORMAL HIGH (ref 70–99)
Glucose-Capillary: 115 mg/dL — ABNORMAL HIGH (ref 70–99)
Glucose-Capillary: 117 mg/dL — ABNORMAL HIGH (ref 70–99)
Glucose-Capillary: 123 mg/dL — ABNORMAL HIGH (ref 70–99)
Glucose-Capillary: 131 mg/dL — ABNORMAL HIGH (ref 70–99)
Glucose-Capillary: 138 mg/dL — ABNORMAL HIGH (ref 70–99)
Glucose-Capillary: 144 mg/dL — ABNORMAL HIGH (ref 70–99)

## 2023-01-04 LAB — HEPARIN LEVEL (UNFRACTIONATED)
Heparin Unfractionated: 0.18 [IU]/mL — ABNORMAL LOW (ref 0.30–0.70)
Heparin Unfractionated: 0.43 [IU]/mL (ref 0.30–0.70)
Heparin Unfractionated: <0.1 [IU]/mL — ABNORMAL LOW (ref 0.30–0.70)

## 2023-01-04 LAB — CBC
HCT: 36.4 % (ref 36.0–46.0)
Hemoglobin: 11.7 g/dL — ABNORMAL LOW (ref 12.0–15.0)
MCH: 27 pg (ref 26.0–34.0)
MCHC: 32.1 g/dL (ref 30.0–36.0)
MCV: 84.1 fL (ref 80.0–100.0)
Platelets: 145 10*3/uL — ABNORMAL LOW (ref 150–400)
RBC: 4.33 MIL/uL (ref 3.87–5.11)
RDW: 13.7 % (ref 11.5–15.5)
WBC: 7.5 10*3/uL (ref 4.0–10.5)
nRBC: 0 % (ref 0.0–0.2)

## 2023-01-04 NOTE — Progress Notes (Signed)
Physical Therapy Treatment Patient Details Name: Jocelyn [name] MRN: [id number] DOB: [birth year] Today's Date: 01/04/2023   History of Present Illness 89 yo female on 11/27 L sided weakness and R MCA occlusion intubated 11/28 IR revascularization complicated arterial cannulation requiring multiple sticks, extubated 11/29.  PMH HTN CHF AFIB RVR on eliquis HLD OSA.    PT Comments  Pt greeted resting in bed, lethargic throughout session, but some increase in alertness noted once seated up EOB. With increased time pt able to follow simple single step commands. Physically pt requiring mod A to roll R/L at start of session for peri-care and linen change, and max A to come to sitting up EOB. Pt requiring varying levels of assist for sitting balance CGA-mod A due to L lateral lean and some pushing with RUE. Pt able to perform propped sitting on R elbow and return to midline with min A with some improvement in midline sitting noted. Transfers deferred as pt with max fatigue sitting up EOB with increasingly flexed posture. Current plan remains appropriate to address deficits and maximize functional independence and decrease caregiver burden. Pt continues to benefit from skilled PT services to progress toward functional mobility goals.     If plan is discharge home, recommend the following: Two people to help with walking and/or transfers;Direct supervision/assist for medications management;Assist for transportation;Two people to help with bathing/dressing/bathroom;Assistance with cooking/housework;Assistance with feeding   Can travel by private vehicle     No  Equipment Recommendations  None recommended by PT    Recommendations for Other Services       Precautions / Restrictions Precautions Precautions: Fall Precaution Comments: watch O2 and BP, limit cervical rotation Restrictions Weight Bearing Restrictions: No     Mobility  Bed Mobility Overal bed mobility: Needs Assistance Bed Mobility:  Supine to Sit, Sit to Supine, Rolling Rolling: Used rails, Mod assist   Supine to sit: Max assist, HOB elevated Sit to supine: Max assist   General bed mobility comments: assist to maneuver BLEs and elevate/lower trunk    Transfers                   General transfer comment: nt this session    Ambulation/Gait                   Stairs             Wheelchair Mobility     Tilt Bed    Modified Rankin (Stroke Patients Only) Modified Rankin (Stroke Patients Only) Pre-Morbid Rankin Score: Slight disability (per RNCM note pt living alone) Modified Rankin: Severe disability     Balance Overall balance assessment: Needs assistance Sitting-balance support: Single extremity supported, Feet supported Sitting balance-Leahy Scale: Poor Sitting balance - Comments: lateral lean to L side, pt was able to prop onto R elbow and maintain static balance with CGA                                    Cognition Arousal: Alert, Lethargic Behavior During Therapy: Flat affect Overall Cognitive Status: Difficult to assess                                 General Comments: following one step commands with increased time but overall slow to respond and difficult to understand        Exercises  General Comments General comments (skin integrity, edema, etc.): VSS on 2L      Pertinent Vitals/Pain Pain Assessment Pain Assessment: Faces Faces Pain Scale: No hurt Pain Intervention(s): Monitored during session    Home Living                          Prior Function            PT Goals (current goals can now be found in the care plan section) Acute Rehab PT Goals Patient Stated Goal: none stated; difficult to understand PT Goal Formulation: Patient unable to participate in goal setting Time For Goal Achievement: 01/13/23 Progress towards PT goals: Progressing toward goals    Frequency    Min 1X/week      PT Plan       Co-evaluation              AM-PAC PT "6 Clicks" Mobility   Outcome Measure  Help needed turning from your back to your side while in a flat bed without using bedrails?: Total Help needed moving from lying on your back to sitting on the side of a flat bed without using bedrails?: Total Help needed moving to and from a bed to a chair (including a wheelchair)?: Total Help needed standing up from a chair using your arms (e.g., wheelchair or bedside chair)?: Total Help needed to walk in hospital room?: Total Help needed climbing 3-5 steps with a railing? : Total 6 Click Score: 6    End of Session   Activity Tolerance: Patient tolerated treatment well;Patient limited by fatigue Patient left: in bed;with bed alarm set;with call bell/phone within reach;Other (comment) (with bed in chair position) Nurse Communication: Mobility status PT Visit Diagnosis: Other abnormalities of gait and mobility (R26.89);Hemiplegia and hemiparesis;Other symptoms and signs involving the nervous system (R29.898) Hemiplegia - Right/Left: Left Hemiplegia - dominant/non-dominant: Non-dominant Hemiplegia - caused by: Cerebral infarction     Time: 1610-9604 PT Time Calculation (min) (ACUTE ONLY): 30 min  Charges:    $Therapeutic Activity: 23-37 mins PT General Charges $$ ACUTE PT VISIT: 1 Visit                     Rahm Minix R. PTA Acute Rehabilitation Services Office: [phone]   Catalina Antigua 01/04/2023, 3:11 PM

## 2023-01-04 NOTE — TOC Progression Note (Signed)
Transition of Care [facility]) - Progression Note    Patient Details  Name: Jocelyn [name] MRN: [id number] Date of Birth: 05-26-1931  Transition of Care [facility]) CM/SW Contact  Kermit Balo, RN Phone Number: 01/04/2023, 3:18 PM  Clinical Narrative:     Pt continues with cortrak. CM spoke with MD and he feels pt is LTACH appropriate. CM reached out to the patients daughter, Ladean Raya and she is in agreement. CM provided her choice and she asked to have [employer] review pt's information  and see if insurance will approve. CM has updated Chrissie Noa with [employer].  TOC following.  Expected Discharge Plan: Long Term Acute Care (LTAC) Barriers to Discharge: Continued Medical Work up  Expected Discharge Plan and Services   Discharge Planning Services: CM Consult Post Acute Care Choice: Long Term Acute Care (LTAC) Living arrangements for the past 2 months: Single Family Home                                       Social Determinants of Health (SDOH) Interventions SDOH Screenings   Food Insecurity: Patient Unable To Answer (12/30/2022)  Housing: Patient Unable To Answer (12/30/2022)  Transportation Needs: No Transportation Needs (12/30/2022)  Utilities: Not At Risk (12/30/2022)  Alcohol Screen: Low Risk  (12/03/2018)  Depression (PHQ2-9): Low Risk  (12/06/2022)  Financial Resource Strain: Medium Risk (07/26/2018)  Physical Activity: Inactive (07/26/2018)  Social Connections: Moderately Isolated (07/26/2018)  Stress: No Stress Concern Present (07/26/2018)  Tobacco Use: Low Risk  (12/29/2022)    Readmission Risk Interventions     No data to display

## 2023-01-04 NOTE — Plan of Care (Signed)
  Problem: Ischemic Stroke/TIA Tissue Perfusion: Goal: Complications of ischemic stroke/TIA will be minimized Outcome: Progressing   Problem: Self-Care: Goal: Ability to communicate needs accurately will improve Outcome: Progressing

## 2023-01-04 NOTE — Progress Notes (Signed)
Speech Language Pathology Treatment: Dysphagia;Cognitive-Linquistic  Patient Details Name: Jocelyn [name] MRN: [id number] DOB: November 14, 1931 Today's Date: 01/04/2023 Time: 0630-1601 SLP Time Calculation (min) (ACUTE ONLY): 17 min  Assessment / Plan / Recommendation Clinical Impression  Pt speech for cognitive-linguistic tx and trials of POs. Pt is alert and cooperative. Pt inconsistently delayed responses. Pt easily distracted by environmental stimuli as well as internal stimuli. Pt with dysarthria c/b reduced vocal loudness and imprecise articulation which affects overall speech intelligibility. Mod/max verbal cues need to pt to speak louder. During PO trials, pt demonstrates inconsistent and at times significantly prolonged oral holding (>1 minute). This was most pronounced with puree trials and trials of ice chips. Anterior loss of liquids on L. S Additional signs of dysphagia are still noted, including 5 or more sequential swallows per thin liquid bolus (reduced to ~3-5 with puree trials and ice chips). Pt's reduced attention is likely impacting swallow function. SLP to continue to f/u per established POC.   HPI HPI: 89 yo female presenting on 11/27 with L sided weakness and R MCA occlusion s/p IR revascularization with complicated arterial cannulation requiring multiple sticks. Intubated 11/28-11/29. PMH includes: GERD, HTN, CHF, AFIB RVR on eliquis, HLD, OSA      SLP Plan  Continue with current plan of care      Recommendations for follow up therapy are one component of a multi-disciplinary discharge planning process, led by the attending physician.  Recommendations may be updated based on patient status, additional functional criteria and insurance authorization.    Recommendations  Diet recommendations: NPO (few ice chips following oral care by nursing staff) Medication Administration: Via alternative means                  Oral care QID;Oral care prior to ice chip/H20    Frequent or constant Supervision/Assistance Dysphagia, oropharyngeal phase (R13.12);Cognitive communication deficit (R41.841);Attention and concentration deficit;Dysarthria and anarthria (R47.1)     Continue with current plan of care     [name], M.S., CCC-SLP Speech-Language Pathologist Secure Chat Preferred  O: [phone]    01/04/2023, 12:17 PM

## 2023-01-04 NOTE — Progress Notes (Signed)
PHARMACY - ANTICOAGULATION CONSULT NOTE  Pharmacy Consult for heparin  Indication: atrial fibrillation  No Known Allergies  Patient Measurements: Height: 5\' 3"  (160 cm) Weight: 68.3 kg (150 lb 9.2 oz) IBW/kg (Calculated) : 52.4 Heparin Dosing Weight: 65.5 kg  Vital Signs: Temp: 99.2 F (37.3 C) (12/04 0014) Temp Source: Oral (12/04 0014) BP: 162/79 (12/04 0014) Pulse Rate: 66 (12/04 0014)  Labs: Recent Labs    01/01/23 0550 01/02/23 0529 01/02/23 1802 01/02/23 1802 01/03/23 0443 01/03/23 1425 01/04/23 0006  HGB 12.1 10.7*  --   --   --   --   --   HCT 37.5 34.1*  --   --   --   --   --   PLT 99* 120*  --   --   --   --   --   APTT  --   --  29  --   --   --   --   HEPARINUNFRC  --   --  <0.10*   < > <0.10* <0.10* 0.18*  CREATININE 0.77 0.59  --   --  0.61  --   --    < > = values in this interval not displayed.    Estimated Creatinine Clearance: 44.3 mL/min (by C-G formula based on SCr of 0.61 mg/dL).   Medical History: Past Medical History:  Diagnosis Date   Arthritis of both knees 08/01/2015   Colitis    Essential hypertension, benign 07/28/2014   GERD (gastroesophageal reflux disease)    Hx of iron deficiency anemia 09/02/2015   Hyperlipidemia    Hypertension    Lumbar pain    Medicare annual wellness visit, subsequent 04/07/2017   OSA on CPAP 08/01/2015   Overweight (BMI 25.0-29.9) 07/10/2013   Prediabetes 09/02/2015   SOB (shortness of breath) 07/10/2013   Assessment: 31 YOF presenting with stroke 11/28. Patient with a PMH of atrial fibrillation on Eliquis PTA- LD unknown. Pharmacy consulted to dose heparin. Targeting lower end of therapeutic range given recent stroke.  Heparin level subtherapeutic (< 0.10) on 900 units/hr, therefore not being affected by home Eliquis usage. aPTT also subtherapeutic (29). No need to obtain further aPTTs. No issues with infusion or s/sx of bleeding per RN. Will increase infusion rate.  12/4 AM update:  Heparin level  sub-therapeutic but trending up  Goal of Therapy:  Heparin level 0.3-0.5  Monitor platelets by anticoagulation protocol: Yes   Plan:  Increase heparin infusion to 1500 units/hr Check heparin level in 8 hours  Assess heparin level and CBC daily  F/U restart apixaban   Abran Duke, PharmD, BCPS Clinical Pharmacist Phone: [phone]

## 2023-01-04 NOTE — Progress Notes (Signed)
PHARMACY - ANTICOAGULATION CONSULT NOTE  Pharmacy Consult for heparin  Indication: atrial fibrillation  No Known Allergies  Patient Measurements: Height: 5\' 3"  (160 cm) Weight: 68.3 kg (150 lb 9.2 oz) IBW/kg (Calculated) : 52.4 Heparin Dosing Weight: 65.5  Vital Signs: Temp: 98.4 F (36.9 C) (12/04 1136) Temp Source: Oral (12/04 1136) BP: 169/68 (12/04 1136) Pulse Rate: 81 (12/04 1136)  Labs: Recent Labs    01/02/23 0529 01/02/23 1802 01/02/23 1802 01/03/23 0443 01/03/23 1425 01/04/23 0006 01/04/23 1200  HGB 10.7*  --   --   --   --   --  11.7*  HCT 34.1*  --   --   --   --   --  36.4  PLT 120*  --   --   --   --   --  145*  APTT  --  29  --   --   --   --   --   HEPARINUNFRC  --  <0.10*   < > <0.10* <0.10* 0.18* <0.10*  CREATININE 0.59  --   --  0.61  --   --   --    < > = values in this interval not displayed.    Estimated Creatinine Clearance: 44.3 mL/min (by C-G formula based on SCr of 0.61 mg/dL).   Medical History: Past Medical History:  Diagnosis Date   Arthritis of both knees 08/01/2015   Colitis    Essential hypertension, benign 07/28/2014   GERD (gastroesophageal reflux disease)    Hx of iron deficiency anemia 09/02/2015   Hyperlipidemia    Hypertension    Lumbar pain    Medicare annual wellness visit, subsequent 04/07/2017   OSA on CPAP 08/01/2015   Overweight (BMI 25.0-29.9) 07/10/2013   Prediabetes 09/02/2015   SOB (shortness of breath) 07/10/2013    Assessment: 89 YOF presenting with stroke 11/28. Patient with a PMH of atrial fibrillation on Eliquis PTA- LD unknown. Pharmacy consulted to dose heparin.   Will not bolus given recent stroke, and will target lower end of therapeutic range. Will assess aPTT with initial heparin level to confirm heparin level is not being influenced by apixaban usage.   12/4: Heparin level repeatedly undetectable/subtherapeutic despite multiple rate increases, now with heparin running at 1500 units/hour. No signs of  bleeding or issues with the heparin infusion noted. Phlebotomist/RN have noted that patient is a difficult lab draw.  Patient has not passed swallow evaluation to resume PTA eliquis, may need placement of PEG.   Goal of Therapy:  Heparin level 0.3-0.5  Monitor platelets by anticoagulation protocol: Yes   Plan:  Increase heparin rate to 1650 units/hour - no bolus  Check anti-Xa in 8 hours  Assess heparin level and CBC daily  F/U restart apixaban   Jani Gravel, PharmD Clinical Pharmacist  01/04/2023 12:45 PM

## 2023-01-04 NOTE — Progress Notes (Addendum)
STROKE TEAM PROGRESS NOTE   BRIEF HPI [name] is a 89 y.o. female with history of  GERD, prediabetes, permanent afibb on eliquis, HTN, HLD, OSA, also has a history of atrial fibrillation on Eliquis and appears to have been compliant with her medications.  She presented today after her brother found her unresponsive and thought that she was not breathing.  However, upon arrival of EMS, she was moving her right side and attempting to talk, although she was confused.  She was noted to have left-sided weakness, and code stroke was activated.   CT shows no acute abnormalities, acute clot at the right ICA Bifurcation and acute obstructing right MCA M2 clot was seen on CT angiogram with large penumbra on CT perfusion.  NIH on Admission 19  SIGNIFICANT HOSPITAL EVENTS 11/28.  Admitted.  CT shows no acute abnormalities, acute clot at the right ICA Bifurcation and acute obstructing right MCA M2 clot was seen on CTA with large penumbra on CT perfusion.Underwent medical thrombectomy of right MCA occlusion 12/1: transferred out of ICU  INTERIM HISTORY/SUBJECTIVE No family at the bedside.  Speech therapy continue to follow patient still n.p.o. on tube feeds Labs and vitals are stable Patient is awake and interactive following some simple commands  OBJECTIVE  CBC    Component Value Date/Time   WBC 7.5 01/04/2023 1200   RBC 4.33 01/04/2023 1200   HGB 11.7 (L) 01/04/2023 1200   HGB 12.5 09/19/2022 0916   HCT 36.4 01/04/2023 1200   HCT 39.6 09/19/2022 0916   PLT 145 (L) 01/04/2023 1200   PLT 169 09/19/2022 0916   MCV 84.1 01/04/2023 1200   MCV 87 09/19/2022 0916   MCV 82 04/13/2011 1125   MCH 27.0 01/04/2023 1200   MCHC 32.1 01/04/2023 1200   RDW 13.7 01/04/2023 1200   RDW 12.9 09/19/2022 0916   RDW 14.4 04/13/2011 1125   LYMPHSABS 1.1 12/29/2022 1706   LYMPHSABS 3.7 (H) 07/08/2015 1136   MONOABS 0.2 12/29/2022 1706   EOSABS 0.0 12/29/2022 1706   EOSABS 0.2 07/08/2015 1136    BASOSABS 0.0 12/29/2022 1706   BASOSABS 0.1 07/08/2015 1136    BMET    Component Value Date/Time   NA 139 01/04/2023 1200   NA 143 12/07/2022 1535   NA 141 04/13/2011 1125   K 4.9 01/04/2023 1200   K 3.4 (L) 04/13/2011 1125   CL 104 01/04/2023 1200   CL 105 04/13/2011 1125   CO2 27 01/04/2023 1200   CO2 29 04/13/2011 1125   GLUCOSE 122 (H) 01/04/2023 1200   GLUCOSE 111 (H) 04/13/2011 1125   BUN 19 01/04/2023 1200   BUN 16 12/07/2022 1535   BUN 14 04/13/2011 1125   CREATININE 0.64 01/04/2023 1200   CREATININE 0.80 08/01/2018 1544   CALCIUM 9.4 01/04/2023 1200   CALCIUM 9.2 04/13/2011 1125   EGFR 74 12/07/2022 1535   GFRNONAA >60 01/04/2023 1200   GFRNONAA 67 08/01/2018 1544    IMAGING past 24 hours No results found.  Vitals:   01/04/23 0014 01/04/23 0513 01/04/23 0817 01/04/23 1136  BP: (!) 162/79 (!) 161/81 (!) 163/67 (!) 169/68  Pulse: 66 80 81 81  Resp: 18 18 18 18   Temp: 99.2 F (37.3 C) 98.3 F (36.8 C) 98.5 F (36.9 C) 98.4 F (36.9 C)  TempSrc: Oral  Oral Oral  SpO2: 98% 100% 100% 100%  Weight:      Height:         PHYSICAL EXAM  General: Critically ill elderly African-American lady in no apparent distress Psych: Unable to assess CV: Irregularly irregular on monitor Respiratory:  Regular, unlabored respirations GI: Abdomen soft and nontender   NEURO:  Mental Status: More alert and interactive today.  She follows simple commands, and speech is mostly mumbled incomprehensible sounds.  Cranial Nerves:  II: PERRL.  No blink to threat on left III, IV, VI: EOMI: continued slight rightward gaze. Eyelids elevate symmetrically.  V: Sensation is intact to light touch and symmetrical to face.  VII: left facial droop VIII: hearing intact to voice. IX, X: Able to assess XI: Able to assess XII: tongue is midline without fasciculations. Motor:    Dense left hemiplegia with flaccid left arm.  Was able to slightly wiggle left toes.  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  ASPECTS 7   CTA head & neck acute clot at the right terminal ICA and acute obstructing right MCA M2 and ACA A1. CT perfusion 6/149 in the right MCA territory.  S/p IR via right ICA direct access with TICI3 MRI scattered acute right MCA distribution infarcts involving the right caudate and lentiform nuclei, right insula, and overlying right cerebral hemisphere, small volume petechial hemorrhage at the posterior right frontal region MRA  with interval revascularization of previously seen clot at the right ICA terminus and right M2 segment, Focal moderate distal right P2 stenosis. 2D Echo EF 60 to 65%.  LV severe concentric hypertrophy.  LA severely dilated LDL 33 HgbA1c 6.4 VTE prophylaxis - Heparin IV Eliquis prior to admission, continue heparin IV. Therapy recommendations: SNF Disposition: Pending  Atrial fibrillation Home Meds: Eliquis On tele Rate controlled Continue heparin IV.  ? Cerebral aneurysm MRA head 3-4 mm outpouching arising from the supraclinoid left ICA, suspicious for a left PCOM aneurysm CTA head did not report aneurysm Yearly MRA follow up as outpt  Hypertensive Emergency Home meds: Imdur 120 mg, olmesartan-amlodipine-HCTZ 40-10-12.5 mg, spironolactone 25 mg Unstable On Cleviprex drip and wean as tolerated Restarted home Lasix and Avapro 150 mg daily, spironolactone 12.5 Blood Pressure Goal: SBP 120-160 for first 24 hours then less than 180   Hyperlipidemia Home meds:  Lipitor 40 mg, resumed in hospital LDL 33, goal < 70 Continue statin at discharge  Acute respiratory failure post procedure Hypoxia  Concern for Aspiration Pneumonia  Extubated on 11/29 CXR 11/30: left lung opacification, likely mucous plug. 40mg  Lasix x 1 on 11/30  Continue ceftriaxone for 7 days, started 11/30  Dysphagia Patient has post-stroke dysphagia, SLP consulted Continue NPO Core track tube in place  Continue tube feedings Advance diet as tolerated  Other Stroke Risk Factors  Obstructive sleep apnea,  on CPAP at home  Advanced age   Hospital day # 6   Pt seen by Neuro NP/APP and later by MD. Note/plan to be edited by MD as needed.     Gevena Mart DNP, ACNPC-AG  [facility]  I have personally obtained history,examined this patient, reviewed notes, independently viewed imaging studies, participated in medical decision making and plan of  care.ROS completed by me personally and pertinent positives fully documented  I have made any additions or clarifications directly to the above note. Agree with note above.  Patient continues to have significant left hemiplegia and dysphagia and is unable to swallow safely.  Continue tube feeds and IV heparin.  Family to decide on PEG tube.  May be okay to consider for transfer to LTAC if her insurance allows and family wishes.  Delia Heady, MD Medical Director [facility] Pager: [phone] 01/04/2023 2:09 PM   To contact Stroke Continuity provider, please refer to [email or web address]. After hours, contact General Neurology

## 2023-01-04 NOTE — Plan of Care (Signed)
  Problem: Pain Management: Goal: General experience of comfort will improve Outcome: Progressing   Problem: Safety: Goal: Ability to remain free from injury will improve Outcome: Progressing   Problem: Skin Integrity: Goal: Risk for impaired skin integrity will decrease Outcome: Progressing

## 2023-01-04 NOTE — Progress Notes (Signed)
PHARMACY - ANTICOAGULATION CONSULT NOTE  Pharmacy Consult for heparin Indication:  Afib in setting of acute stroke  Labs: Recent Labs    01/02/23 0529 01/02/23 1802 01/02/23 1802 01/03/23 0443 01/03/23 1425 01/04/23 0006 01/04/23 1200 01/04/23 2127  HGB 10.7*  --   --   --   --   --  11.7*  --   HCT 34.1*  --   --   --   --   --  36.4  --   PLT 120*  --   --   --   --   --  145*  --   APTT  --  29  --   --   --   --   --   --   HEPARINUNFRC  --  <0.10*   < > <0.10*   < > 0.18* <0.10* 0.43  CREATININE 0.59  --   --  0.61  --   --  0.64  --    < > = values in this interval not displayed.   Assessment/Plan:  89yo female therapeutic on heparin after rate changes. Will continue infusion at current rate of 1650 units/hr and confirm stable with am labs.  Vernard Gambles, PharmD, BCPS 01/04/2023 11:11 PM

## 2023-01-05 DIAGNOSIS — I639 Cerebral infarction, unspecified: Secondary | ICD-10-CM | POA: Diagnosis not present

## 2023-01-05 LAB — GLUCOSE, CAPILLARY
Glucose-Capillary: 115 mg/dL — ABNORMAL HIGH (ref 70–99)
Glucose-Capillary: 114 mg/dL — ABNORMAL HIGH (ref 70–99)
Glucose-Capillary: 123 mg/dL — ABNORMAL HIGH (ref 70–99)
Glucose-Capillary: 94 mg/dL (ref 70–99)
Glucose-Capillary: 123 mg/dL — ABNORMAL HIGH (ref 70–99)
Glucose-Capillary: 137 mg/dL — ABNORMAL HIGH (ref 70–99)

## 2023-01-05 LAB — CBC
HCT: 37.7 % (ref 36.0–46.0)
Hemoglobin: 11.9 g/dL — ABNORMAL LOW (ref 12.0–15.0)
MCH: 26.7 pg (ref 26.0–34.0)
MCHC: 31.6 g/dL (ref 30.0–36.0)
MCV: 84.5 fL (ref 80.0–100.0)
Platelets: 162 10*3/uL (ref 150–400)
RBC: 4.46 MIL/uL (ref 3.87–5.11)
RDW: 13.7 % (ref 11.5–15.5)
WBC: 9.3 10*3/uL (ref 4.0–10.5)
nRBC: 0 % (ref 0.0–0.2)

## 2023-01-05 LAB — HEPARIN LEVEL (UNFRACTIONATED): Heparin Unfractionated: 0.44 [IU]/mL (ref 0.30–0.70)

## 2023-01-05 NOTE — Progress Notes (Addendum)
STROKE TEAM PROGRESS NOTE   BRIEF HPI [name] is a 89 y.o. female with history of  GERD, prediabetes, permanent afibb on eliquis, HTN, HLD, OSA, also has a history of atrial fibrillation on Eliquis and appears to have been compliant with her medications.  She presented today after her brother found her unresponsive and thought that she was not breathing.  However, upon arrival of EMS, she was moving her right side and attempting to talk, although she was confused.  She was noted to have left-sided weakness, and code stroke was activated.   CT shows no acute abnormalities, acute clot at the right ICA Bifurcation and acute obstructing right MCA M2 clot was seen on CT angiogram with large penumbra on CT perfusion.  NIH on Admission 19  SIGNIFICANT HOSPITAL EVENTS 11/28.  Admitted.  CT shows no acute abnormalities, acute clot at the right ICA Bifurcation and acute obstructing right MCA M2 clot was seen on CTA with large penumbra on CT perfusion.Underwent medical thrombectomy of right MCA occlusion 12/1: transferred out of ICU  INTERIM HISTORY/SUBJECTIVE No family at the bedside.  Patient has been on a park by the nurses speech therapy continue to follow patient still n.p.o. on tube feeds Labs and vitals are stable Patient is awake and interactive following some simple commands Family is interested in pursuing LTAC if she qualifies.  Await decision OBJECTIVE  CBC    Component Value Date/Time   WBC 9.3 01/05/2023 0632   RBC 4.46 01/05/2023 0632   HGB 11.9 (L) 01/05/2023 0632   HGB 12.5 09/19/2022 0916   HCT 37.7 01/05/2023 0632   HCT 39.6 09/19/2022 0916   PLT 162 01/05/2023 0632   PLT 169 09/19/2022 0916   MCV 84.5 01/05/2023 0632   MCV 87 09/19/2022 0916   MCV 82 04/13/2011 1125   MCH 26.7 01/05/2023 0632   MCHC 31.6 01/05/2023 0632   RDW 13.7 01/05/2023 0632   RDW 12.9 09/19/2022 0916   RDW 14.4 04/13/2011 1125   LYMPHSABS 1.1 12/29/2022 1706   LYMPHSABS 3.7 (H)  07/08/2015 1136   MONOABS 0.2 12/29/2022 1706   EOSABS 0.0 12/29/2022 1706   EOSABS 0.2 07/08/2015 1136   BASOSABS 0.0 12/29/2022 1706   BASOSABS 0.1 07/08/2015 1136    BMET    Component Value Date/Time   NA 139 01/04/2023 1200   NA 143 12/07/2022 1535   NA 141 04/13/2011 1125   K 4.9 01/04/2023 1200   K 3.4 (L) 04/13/2011 1125   CL 104 01/04/2023 1200   CL 105 04/13/2011 1125   CO2 27 01/04/2023 1200   CO2 29 04/13/2011 1125   GLUCOSE 122 (H) 01/04/2023 1200   GLUCOSE 111 (H) 04/13/2011 1125   BUN 19 01/04/2023 1200   BUN 16 12/07/2022 1535   BUN 14 04/13/2011 1125   CREATININE 0.64 01/04/2023 1200   CREATININE 0.80 08/01/2018 1544   CALCIUM 9.4 01/04/2023 1200   CALCIUM 9.2 04/13/2011 1125   EGFR 74 12/07/2022 1535   GFRNONAA >60 01/04/2023 1200   GFRNONAA 67 08/01/2018 1544    IMAGING past 24 hours No results found.  Vitals:   01/05/23 0418 01/05/23 0518 01/05/23 1009 01/05/23 1115  BP: 137/64  (!) 153/70 (!) 134/51  Pulse: 85  74 (!) 101  Resp:   16 16  Temp: 98.1 F (36.7 C)  98.3 F (36.8 C) 98 F (36.7 C)  TempSrc: Oral  Oral   SpO2: 97%  100% 100%  Weight:  63.3 kg    Height:         PHYSICAL EXAM General: Critically ill elderly African-American lady in no apparent distress Psych: Unable to assess CV: Irregularly irregular on monitor Respiratory:  Regular, unlabored respirations GI: Abdomen soft and nontender   NEURO:  Mental Status: More alert and interactive today.  She follows simple commands, and speech is mostly mumbled incomprehensible sounds.  Cranial Nerves:  II: PERRL.  No blink to threat on left III, IV, VI: EOMI: continued slight rightward gaze. Eyelids elevate symmetrically.  V: Sensation is intact to light touch and symmetrical to face.  VII: left facial droop VIII: hearing intact to voice. IX, X: Able to assess XI: Able to assess XII: tongue is midline without fasciculations. Motor:    Dense left hemiplegia with flaccid  left arm.  Was able to slightly wiggle left toes.  Spontaneous movement against resistance 4/5 of right arm, against gravity 4/5 movement of right leg Tone: is normal and bulk is normal Sensation-decreased sensation on left  Coordination: Unable to assess Gait- deferred  Most Recent NIH: 18  ASSESSMENT/PLAN  Acute Ischemic Infarct:  right MCA territory infarct with R M2 and A1 occlusion s/p IR with TICI 3 revascularization., etiology: Cardioembolic in the setting of A-fib Code Stroke  CT head  -hyperdense right MCA, cytotoxic edema in right stratum and insula.  ASPECTS 7   CTA head & neck acute clot at the right terminal ICA and acute obstructing right MCA M2 and ACA A1. CT perfusion 6/149 in the right MCA territory.  S/p IR via right ICA direct access with TICI3 MRI scattered acute right MCA distribution infarcts involving the right caudate and lentiform nuclei, right insula, and overlying right cerebral hemisphere, small volume petechial hemorrhage at the posterior right frontal region MRA  with interval revascularization of previously seen clot at the right ICA terminus and right M2 segment, Focal moderate distal right P2 stenosis. 2D Echo EF 60 to 65%.  LV severe concentric hypertrophy.  LA severely dilated LDL 33 HgbA1c 6.4 VTE prophylaxis - Heparin IV Eliquis prior to admission, continue heparin IV. Therapy recommendations: SNF Disposition: Pending  Atrial fibrillation Home Meds: Eliquis On tele Rate controlled Continue heparin IV.  ? Cerebral aneurysm MRA head 3-4 mm outpouching arising from the supraclinoid left ICA, suspicious for a left PCOM aneurysm CTA head did not report aneurysm Yearly MRA follow up as outpt  Hypertensive Emergency Home meds: Imdur 120 mg, olmesartan-amlodipine-HCTZ 40-10-12.5 mg, spironolactone 25 mg Unstable On Cleviprex drip and wean as tolerated Restarted home Lasix and Avapro 150 mg daily, spironolactone 12.5 Blood Pressure Goal: SBP  120-160 for first 24 hours then less than 180   Hyperlipidemia Home meds: Lipitor 40 mg, resumed in hospital LDL 33, goal < 70 Continue statin at discharge  Acute respiratory failure post procedure Hypoxia  Concern for Aspiration Pneumonia  Extubated on 11/29 CXR 11/30: left lung opacification, likely mucous plug. 40mg  Lasix x 1 on 11/30  Continue ceftriaxone for 7 days, started 11/30  Dysphagia Patient has post-stroke dysphagia, SLP consulted Continue NPO Core track tube in place  Continue tube feedings Advance diet as tolerated  Other Stroke Risk Factors  Obstructive sleep apnea,  on CPAP at home  Advanced age   Hospital day # 7   Patient continues to have significant left hemiplegia and dysphagia and is unable to swallow safely.  Continue tube feeds and IV heparin.  Family to decide on PEG tube.  May be okay to  consider for transfer to LTAC if her insurance allows and family wishes. I did a peer to peer telephonic discussion with Wellsite geologist for her insurance whose declined LTAC send patient did not meet requirements. I called and spoke with to the patient's daughter Jocelyn [name] about need for PEG tube and nursing home placement for rehabilitation next week.  She will discuss with rest of the family members and get back to [place].  We will go ahead and make plans for PEG tube for Monday and will call trauma team. Greater than 50% time during this 35-minute visit was spent on counseling and coordination of care and discussion with patient and care team and family and answering questions Delia Heady, MD Medical Director [facility] Pager: [phone] 01/05/2023 2:35 PM   To contact Stroke Continuity provider, please refer to [email or web address]. After hours, contact General Neurology

## 2023-01-05 NOTE — Progress Notes (Signed)
PHARMACY - ANTICOAGULATION CONSULT NOTE  Pharmacy Consult for heparin  Indication: atrial fibrillation  No Known Allergies  Patient Measurements: Height: 5\' 3"  (160 cm) Weight: 63.3 kg (139 lb 8.8 oz) IBW/kg (Calculated) : 52.4 Heparin Dosing Weight: 65.5  Vital Signs: Temp: 98.1 F (36.7 C) (12/05 0418) Temp Source: Oral (12/05 0418) BP: 137/64 (12/05 0418) Pulse Rate: 85 (12/05 0418)  Labs: Recent Labs    01/02/23 1802 01/03/23 0443 01/03/23 1425 01/04/23 1200 01/04/23 2127 01/05/23 0632  HGB  --   --   --  11.7*  --  11.9*  HCT  --   --   --  36.4  --  37.7  PLT  --   --   --  145*  --  162  APTT 29  --   --   --   --   --   HEPARINUNFRC <0.10* <0.10*   < > <0.10* 0.43 0.44  CREATININE  --  0.61  --  0.64  --   --    < > = values in this interval not displayed.    Estimated Creatinine Clearance: 42.7 mL/min (by C-G formula based on SCr of 0.64 mg/dL).   Medical History: Past Medical History:  Diagnosis Date   Arthritis of both knees 08/01/2015   Colitis    Essential hypertension, benign 07/28/2014   GERD (gastroesophageal reflux disease)    Hx of iron deficiency anemia 09/02/2015   Hyperlipidemia    Hypertension    Lumbar pain    Medicare annual wellness visit, subsequent 04/07/2017   OSA on CPAP 08/01/2015   Overweight (BMI 25.0-29.9) 07/10/2013   Prediabetes 09/02/2015   SOB (shortness of breath) 07/10/2013    Assessment: 40 YOF presenting with stroke 11/28. Patient with a PMH of atrial fibrillation on Eliquis PTA- LD unknown. Pharmacy consulted to dose heparin.   Will not bolus given recent stroke, and will target lower end of therapeutic range. Will assess aPTT with initial heparin level to confirm heparin level is not being influenced by apixaban usage.   12/5: Confirmatory heparin level therapeutic at 0.44, with heparin running at 1,650 units/hour. No signs of bleeding or issues with the heparin infusion noted. Phlebotomist/RN have noted that patient is a  difficult lab draw.  Patient has not passed swallow evaluation to resume PTA eliquis, may need placement of PEG.   Goal of Therapy:  Heparin level 0.3-0.5  Monitor platelets by anticoagulation protocol: Yes   Plan:  Continue heparin rate to 1650 units/hour - no bolus  Assess heparin level and CBC daily  F/U restart apixaban   Jani Gravel, PharmD Clinical Pharmacist  01/05/2023 7:58 AM

## 2023-01-05 NOTE — Plan of Care (Signed)
  Problem: Education: Goal: Knowledge of disease or condition will improve Outcome: Progressing Goal: Knowledge of secondary prevention will improve (MUST DOCUMENT ALL) Outcome: Progressing Goal: Knowledge of patient specific risk factors will improve Loraine Leriche N/A or DELETE if not current risk factor) Outcome: Progressing   Problem: Ischemic Stroke/TIA Tissue Perfusion: Goal: Complications of ischemic stroke/TIA will be minimized Outcome: Progressing   Problem: Coping: Goal: Will verbalize positive feelings about self Outcome: Progressing Goal: Will identify appropriate support needs Outcome: Progressing   Problem: Health Behavior/Discharge Planning: Goal: Ability to manage health-related needs will improve Outcome: Progressing Goal: Goals will be collaboratively established with patient/family Outcome: Progressing   Problem: Self-Care: Goal: Ability to participate in self-care as condition permits will improve Outcome: Progressing Goal: Verbalization of feelings and concerns over difficulty with self-care will improve Outcome: Progressing Goal: Ability to communicate needs accurately will improve Outcome: Progressing   Problem: Education: Goal: Understanding of CV disease, CV risk reduction, and recovery process will improve Outcome: Progressing Goal: Individualized Educational Video(s) Outcome: Progressing   Problem: Activity: Goal: Ability to return to baseline activity level will improve Outcome: Progressing   Problem: Cardiovascular: Goal: Ability to achieve and maintain adequate cardiovascular perfusion will improve Outcome: Progressing Goal: Vascular access site(s) Level 0-1 will be maintained Outcome: Progressing   Problem: Health Behavior/Discharge Planning: Goal: Ability to safely manage health-related needs after discharge will improve Outcome: Progressing   Problem: Respiratory: Goal: Ability to maintain a clear airway and adequate ventilation will  improve Outcome: Progressing   Problem: Role Relationship: Goal: Method of communication will improve Outcome: Progressing   Problem: Education: Goal: Knowledge of General Education information will improve Description: Including pain rating scale, medication(s)/side effects and non-pharmacologic comfort measures Outcome: Progressing   Problem: Health Behavior/Discharge Planning: Goal: Ability to manage health-related needs will improve Outcome: Progressing   Problem: Clinical Measurements: Goal: Ability to maintain clinical measurements within normal limits will improve Outcome: Progressing Goal: Will remain free from infection Outcome: Progressing Goal: Diagnostic test results will improve Outcome: Progressing Goal: Respiratory complications will improve Outcome: Progressing Goal: Cardiovascular complication will be avoided Outcome: Progressing   Problem: Activity: Goal: Risk for activity intolerance will decrease Outcome: Progressing   Problem: Nutrition: Goal: Adequate nutrition will be maintained Outcome: Progressing   Problem: Coping: Goal: Level of anxiety will decrease Outcome: Progressing   Problem: Elimination: Goal: Will not experience complications related to bowel motility Outcome: Progressing Goal: Will not experience complications related to urinary retention Outcome: Progressing   Problem: Pain Management: Goal: General experience of comfort will improve Outcome: Progressing   Problem: Safety: Goal: Ability to remain free from injury will improve Outcome: Progressing   Problem: Skin Integrity: Goal: Risk for impaired skin integrity will decrease Outcome: Progressing

## 2023-01-05 NOTE — Progress Notes (Signed)
Occupational Therapy Treatment Patient Details Name: Jocelyn [name] MRN: [id number] DOB: 1931/05/01 Today's Date: 01/05/2023   History of present illness 54 y.oo female on 11/27 L sided weakness and R MCA occlusion intubated 11/28 IR re-vascularization complicated arterial cannulation requiring multiple sticks, extubated 11/29.  PMH HTN CHF AFIB RVR on eliquis HLD OSA.   OT comments  Pt in bed upon therapy arrival. OT session focused on basic cognitive skills and functional mobility at bed level while pt was provided assistance for LB bathing and toilet hygiene due to incontinence. Pt was able to use head nods for yes/no questions during session. Did verbalize pain and fear of falling when rolling right and left. Pt provided with multimodel cueing during all activities to help with sequencing and encourage task participation. Increased assistance required to complete all tasks of 1-2 persons at least. Discharge recommendation changed to [facility] with OT services. OT will continue to follow patient acutely.       If plan is discharge home, recommend the following:  Two people to help with walking and/or transfers;Two people to help with bathing/dressing/bathroom;Assistance with feeding;Supervision due to cognitive status   Equipment Recommendations  Wheelchair (measurements OT);Wheelchair cushion (measurements OT);Hospital bed;Hoyer lift;BSC/3in1       Precautions / Restrictions Precautions Precautions: Fall Restrictions Weight Bearing Restrictions: No       Mobility Bed Mobility Overal bed mobility: Needs Assistance Bed Mobility: Rolling Rolling: Used rails, +2 for physical assistance, +2 for safety/equipment, Max assist         General bed mobility comments: Pt with limited initiation and assist with bed mobility. Verbalized fear of falling although provided reassurance during all rolling transitions. Pt incontinent of BM in bed requiring assist to clean up and perform LB bathing.     Transfers    General transfer comment: NT due to time constraint from incontinence.         ADL either performed or assessed with clinical judgement   ADL Overall ADL's : Needs assistance/impaired Eating/Feeding: NPO   Grooming: Wash/dry face;Bed level;Total assistance       Lower Body Bathing: Total assistance;+2 for physical assistance;+2 for safety/equipment;Bed level;Cueing for sequencing;Cueing for safety   Upper Body Dressing : Total assistance;Bed level Upper Body Dressing Details (indicate cue type and reason): to don new gown Lower Body Dressing: Total assistance;Bed level Lower Body Dressing Details (indicate cue type and reason): for sock management     Toileting- Clothing Manipulation and Hygiene: Total assistance;Bed level;+2 for physical assistance;+2 for safety/equipment              Extremity/Trunk Assessment Upper Extremity Assessment RUE Deficits / Details: Pt able to grasp edge of mattress and/or bed rail with Right hand when sidelying left. Impaired gross grasp demonstrated. LUE Deficits / Details: flaccid, noted to have abnormal wrist alignment with edema   Lower Extremity Assessment RLE Deficits / Details: Required active assist to bend and straighten knee during bed mobility. Demonstrated joint stiffness and pain from lack of mobility.        Vision   Additional Comments: Right gaze preference. Limited attention to the left side although would turn head and locate therapist when provided verbal cues.          Cognition Arousal: Alert Behavior During Therapy: Flat affect Overall Cognitive Status: Impaired/Different from baseline Area of Impairment: Orientation, Following commands, Safety/judgement, Problem solving, Memory                 Orientation Level: Person  Memory: Decreased short-term memory   Safety/Judgement: Decreased awareness of safety, Decreased awareness of deficits   Problem Solving: Slow processing,  Decreased initiation, Difficulty sequencing, Requires verbal cues, Requires tactile cues General Comments: Pt used head nods to communicate yes/no when questions were asked. Was able to verbalize pain/discomfort during bed mobility. Verbalized fear of falling when rolling right and left in bed.              General Comments VSS on RA    Pertinent Vitals/ Pain       Pain Assessment Pain Assessment: Faces Faces Pain Scale: Hurts even more Pain Location: Verbalized pain via moaning when either arm was moved during positioning or bed mobility. Pain Descriptors / Indicators: Grimacing, Moaning Pain Intervention(s): Monitored during session, Repositioned         Frequency  Min 1X/week        Progress Toward Goals  OT Goals(current goals can now be found in the care plan section)  Progress towards OT goals: Not progressing toward goals - comment (Slow progress due to cognition)     Plan         AM-PAC OT "6 Clicks" Daily Activity     Outcome Measure   Help from another person eating meals?: Total Help from another person taking care of personal grooming?: Total Help from another person toileting, which includes using toliet, bedpan, or urinal?: Total Help from another person bathing (including washing, rinsing, drying)?: Total Help from another person to put on and taking off regular upper body clothing?: Total Help from another person to put on and taking off regular lower body clothing?: Total 6 Click Score: 6    End of Session    OT Visit Diagnosis: Unsteadiness on feet (R26.81);Muscle weakness (generalized) (M62.81)   Activity Tolerance Patient tolerated treatment well   Patient Left in bed;with call bell/phone within reach;with bed alarm set   Nurse Communication Mobility status        Time: 1127-1206 OT Time Calculation (min): 39 min  Charges: OT General Charges $OT Visit: 1 Visit OT Treatments $Self Care/Home Management : 23-37 mins $Therapeutic  Activity: 8-22 mins  Limmie Patricia, OTR/L,CBIS  Supplemental OT - [facility] and [facility] Secure Chat Preferred    Jayanth Szczesniak, Charisse March 01/05/2023, 2:48 PM

## 2023-01-05 NOTE — Plan of Care (Signed)
  Problem: Education: Goal: Knowledge of disease or condition will improve Outcome: Progressing   Problem: Education: Goal: Knowledge of secondary prevention will improve (MUST DOCUMENT ALL) Outcome: Progressing   Problem: Education: Goal: Knowledge of patient specific risk factors will improve Jocelyn [name] N/A or DELETE if not current risk factor) Outcome: Progressing   Problem: Ischemic Stroke/TIA Tissue Perfusion: Goal: Complications of ischemic stroke/TIA will be minimized Outcome: Progressing   Problem: Coping: Goal: Will verbalize positive feelings about self Outcome: Progressing   Problem: Coping: Goal: Will identify appropriate support needs Outcome: Progressing

## 2023-01-06 ENCOUNTER — Encounter: Payer: 59 | Admitting: Internal Medicine

## 2023-01-06 DIAGNOSIS — I639 Cerebral infarction, unspecified: Secondary | ICD-10-CM | POA: Diagnosis not present

## 2023-01-06 LAB — GLUCOSE, CAPILLARY
Glucose-Capillary: 113 mg/dL — ABNORMAL HIGH (ref 70–99)
Glucose-Capillary: 141 mg/dL — ABNORMAL HIGH (ref 70–99)
Glucose-Capillary: 145 mg/dL — ABNORMAL HIGH (ref 70–99)
Glucose-Capillary: 157 mg/dL — ABNORMAL HIGH (ref 70–99)
Glucose-Capillary: 145 mg/dL — ABNORMAL HIGH (ref 70–99)
Glucose-Capillary: 145 mg/dL — ABNORMAL HIGH (ref 70–99)
Glucose-Capillary: 145 mg/dL — ABNORMAL HIGH (ref 70–99)

## 2023-01-06 LAB — CBC
HCT: 34.3 % — ABNORMAL LOW (ref 36.0–46.0)
Hemoglobin: 11.4 g/dL — ABNORMAL LOW (ref 12.0–15.0)
MCH: 27.3 pg (ref 26.0–34.0)
MCHC: 33.2 g/dL (ref 30.0–36.0)
MCV: 82.1 fL (ref 80.0–100.0)
Platelets: 196 10*3/uL (ref 150–400)
RBC: 4.18 MIL/uL (ref 3.87–5.11)
RDW: 13.7 % (ref 11.5–15.5)
WBC: 11.1 10*3/uL — ABNORMAL HIGH (ref 4.0–10.5)
nRBC: 0 % (ref 0.0–0.2)

## 2023-01-06 LAB — HEPARIN LEVEL (UNFRACTIONATED)
Heparin Unfractionated: 0.72 [IU]/mL — ABNORMAL HIGH (ref 0.30–0.70)
Heparin Unfractionated: >1.1 [IU]/mL — ABNORMAL HIGH (ref 0.30–0.70)

## 2023-01-06 MED ORDER — NYSTATIN 100000 UNIT/ML MT SUSP
5.0000 mL | Freq: Four times a day (QID) | OROMUCOSAL | Status: DC
  Administered 2023-01-06 – 2023-01-11 (×14): 500000 [IU] via ORAL
  Filled 2023-01-06 (×18): qty 5

## 2023-01-06 MED ORDER — HEPARIN (PORCINE) 25000 UT/250ML-% IV SOLN
1100.0000 [IU]/h | INTRAVENOUS | Status: DC
  Administered 2023-01-06: 1100 [IU]/h via INTRAVENOUS

## 2023-01-06 NOTE — Progress Notes (Signed)
Physical Therapy Treatment Patient Details Name: Jocelyn [name] MRN: [id number] DOB: [birth year] Today's Date: 01/06/2023   History of Present Illness 71 y.oo female on 11/27 L sided weakness and R MCA occlusion intubated 11/28 IR re-vascularization complicated arterial cannulation requiring multiple sticks, extubated 11/29.  PMH HTN CHF AFIB RVR on eliquis HLD OSA.    PT Comments  Pt resting in bed on arrival. Session focused on sitting balance and increased activity tolerance with pt able to maintain sitting up EOB ~51mins with grossly CGA for safety. Pt able attempt standing x2 with max A to slightly elevate hips however pt calling out in pain with each attempt. Pt continues to be limited by fatigue and lethargy with pt needing cues to maintain eyes open during session. Pt continues to benefit from skilled PT services to progress toward functional mobility goals.      If plan is discharge home, recommend the following: Two people to help with walking and/or transfers;Direct supervision/assist for medications management;Assist for transportation;Two people to help with bathing/dressing/bathroom;Assistance with cooking/housework;Assistance with feeding   Can travel by private vehicle     No  Equipment Recommendations  None recommended by PT    Recommendations for Other Services       Precautions / Restrictions Precautions Precautions: Fall Precaution Comments: watch O2 and BP, limit cervical rotation Restrictions Weight Bearing Restrictions: No     Mobility  Bed Mobility Overal bed mobility: Needs Assistance Bed Mobility: Rolling, Sidelying to Sit, Sit to Supine Rolling: Used rails, Max assist Sidelying to sit: Max assist, HOB elevated, Used rails   Sit to supine: Max assist   General bed mobility comments: max A for all aspects with decreased initiation    Transfers Overall transfer level: Needs assistance Equipment used: 1 person hand held assist Transfers: Sit  to/from Stand Sit to Stand: Max assist           General transfer comment: max A to attempt with poor initiation and moaning out in pain    Ambulation/Gait                   Stairs             Wheelchair Mobility     Tilt Bed    Modified Rankin (Stroke Patients Only) Modified Rankin (Stroke Patients Only) Pre-Morbid Rankin Score: Slight disability (per RNCM note pt living alone) Modified Rankin: Severe disability     Balance Overall balance assessment: Needs assistance Sitting-balance support: Single extremity supported, Feet supported Sitting balance-Leahy Scale: Poor Sitting balance - Comments: lateral lean to L side, pt was able to prop onto R elbow and maintain static balance with CGA   Standing balance support: Bilateral upper extremity supported Standing balance-Leahy Scale: Zero                              Cognition Arousal: Alert Behavior During Therapy: Flat affect Overall Cognitive Status: Impaired/Different from baseline Area of Impairment: Orientation, Following commands, Safety/judgement, Problem solving, Memory                 Orientation Level: Person   Memory: Decreased short-term memory   Safety/Judgement: Decreased awareness of safety, Decreased awareness of deficits   Problem Solving: Slow processing, Decreased initiation, Difficulty sequencing, Requires verbal cues, Requires tactile cues General Comments: Pt used head nods to communicate yes/no when questions were asked. Was able to verbalize pain/discomfort during bed mobility.  Exercises      General Comments General comments (skin integrity, edema, etc.): VSS on RA      Pertinent Vitals/Pain Pain Assessment Pain Assessment: Faces Faces Pain Scale: Hurts little more Pain Location: Verbalized pain via moaning when either arm was moved during positioning or bed mobility. Pain Descriptors / Indicators: Grimacing, Moaning Pain Intervention(s):  Monitored during session, Limited activity within patient's tolerance    Home Living                          Prior Function            PT Goals (current goals can now be found in the care plan section) Acute Rehab PT Goals Patient Stated Goal: none stated; difficult to understand PT Goal Formulation: Patient unable to participate in goal setting Time For Goal Achievement: 01/13/23 Progress towards PT goals: Progressing toward goals (slowly)    Frequency    Min 1X/week      PT Plan      Co-evaluation              AM-PAC PT "6 Clicks" Mobility   Outcome Measure  Help needed turning from your back to your side while in a flat bed without using bedrails?: Total Help needed moving from lying on your back to sitting on the side of a flat bed without using bedrails?: Total Help needed moving to and from a bed to a chair (including a wheelchair)?: Total Help needed standing up from a chair using your arms (e.g., wheelchair or bedside chair)?: Total Help needed to walk in hospital room?: Total Help needed climbing 3-5 steps with a railing? : Total 6 Click Score: 6    End of Session Equipment Utilized During Treatment: Gait belt Activity Tolerance: Patient tolerated treatment well;Patient limited by fatigue Patient left: in bed;with bed alarm set;with call bell/phone within reach Nurse Communication: Mobility status PT Visit Diagnosis: Other abnormalities of gait and mobility (R26.89);Hemiplegia and hemiparesis;Other symptoms and signs involving the nervous system (R29.898) Hemiplegia - Right/Left: Left Hemiplegia - dominant/non-dominant: Non-dominant Hemiplegia - caused by: Cerebral infarction     Time: 4132-4401 PT Time Calculation (min) (ACUTE ONLY): 23 min  Charges:    $Therapeutic Activity: 23-37 mins PT General Charges $$ ACUTE PT VISIT: 1 Visit                     Haydon Kalmar R. PTA Acute Rehabilitation Services Office: [phone]   Catalina Antigua 01/06/2023, 4:36 PM

## 2023-01-06 NOTE — NC FL2 (Signed)
[place] MEDICAID FL2 LEVEL OF CARE FORM     IDENTIFICATION  Patient Name: Jocelyn [name] Birthdate: 02-24-1931 Sex: female Admission Date (Current Location): 12/29/2022  [facility] and [place] Number:  Chiropodist and Address:  [facility], [address], [place], [name] [address]      Provider Number: [id number]  Attending Physician Name and Address:  Stroke, Md, MD  Relative Name and Phone Number:       Current Level of Care: Hospital Recommended Level of Care: Skilled Nursing Facility Prior Approval Number:    Date Approved/Denied:   PASRR Number: [id number]  Discharge Plan: SNF    Current Diagnoses: Patient Active Problem List   Diagnosis Date Noted   Acute ischemic stroke (HCC) 12/29/2022   Coronary artery disease of native artery of native heart with stable angina pectoris (HCC) 04/21/2022   Coronary artery calcification 10/27/2021   Aortic atherosclerosis (HCC) 10/27/2021   Lumbar facet arthropathy 03/22/2018   SI joint arthritis (HCC) 03/22/2018   Chronic pain of both knees 03/22/2018   Chronic pain syndrome 03/22/2018   Right sided colitis 12/30/2017   Impaired fasting glucose 02/06/2017   Carpal tunnel syndrome on right 09/05/2015   Gastroesophageal reflux disease without esophagitis 09/05/2015   Hx of iron deficiency anemia 09/02/2015   OSA on CPAP 08/01/2015   Bilateral primary osteoarthritis of knee 08/01/2015   Controlled substance agreement signed 07/08/2015   Essential hypertension 07/28/2014   Lumbar degenerative disc disease 07/28/2014   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   Stable angina (HCC) 07/10/2013   Hyperlipidemia LDL goal <70 07/10/2013   Cerebrovascular accident (CVA) (HCC) 07/10/2013   Dyspnea on exertion 07/10/2013   Hearing loss 01/29/2009   Arthritis sicca 05/23/2006    Orientation RESPIRATION BLADDER Height & Weight     Self  Normal Incontinent Weight: 139 lb 1.8 oz (63.1 kg) Height:  5'  3" (160 cm)  BEHAVIORAL SYMPTOMS/MOOD NEUROLOGICAL BOWEL NUTRITION STATUS      Incontinent Feeding tube (Osmolite 1.2 @ 55 mL/hr)  AMBULATORY STATUS COMMUNICATION OF NEEDS Skin   Extensive Assist Verbally Normal                       Personal Care Assistance Level of Assistance  Bathing, Feeding, Dressing Bathing Assistance: Maximum assistance Feeding assistance: Maximum assistance Dressing Assistance: Maximum assistance     Functional Limitations Info  Speech     Speech Info: Impaired (dysarthria)    SPECIAL CARE FACTORS FREQUENCY  PT (By licensed PT), OT (By licensed OT), Speech therapy     PT Frequency: 5x/wk OT Frequency: 5x/wk     Speech Therapy Frequency: 5x/wk      Contractures Contractures Info: Not present    Additional Factors Info  Code Status, Allergies Code Status Info: Full Allergies Info: NKA           Current Medications (01/06/2023):  This is the current hospital active medication list Current Facility-Administered Medications  Medication Dose Route Frequency Provider Last Rate Last Admin   acetaminophen (TYLENOL) tablet 650 mg  650 mg Oral Q4H PRN de Saintclair Halsted, Cortney E, NP       Or   acetaminophen (TYLENOL) 160 MG/5ML solution 650 mg  650 mg Per Tube Q4H PRN de Saintclair Halsted, Cortney E, NP   650 mg at 01/06/23 1228   Or   acetaminophen (TYLENOL) suppository 650 mg  650 mg Rectal Q4H PRN de Saintclair Halsted, Cortney E,  NP       atorvastatin (LIPITOR) tablet 40 mg  40 mg Per Tube Daily Gevena Mart A, NP   40 mg at 01/06/23 0939   famotidine (PEPCID) tablet 20 mg  20 mg Per Tube Daily Hunsucker, Lesia Sago, MD   20 mg at 01/06/23 3235   feeding supplement (OSMOLITE 1.2 CAL) liquid 1,000 mL  1,000 mL Per Tube Continuous Marvel Plan, MD 55 mL/hr at 01/05/23 1600 Infusion Verify at 01/05/23 1600   fentaNYL (SUBLIMAZE) injection 25 mcg  25 mcg Intravenous Once de Saintclair Halsted, Cortney E, NP       fentaNYL (SUBLIMAZE) injection 25 mcg  25 mcg Intravenous Q6H PRN  Rejeana Brock, MD   25 mcg at 01/01/23 2128   heparin ADULT infusion 100 units/mL (25000 units/227mL)  1,400 Units/hr Intravenous Continuous Barnetta Chapel, PA-C 14 mL/hr at 01/06/23 0957 1,400 Units/hr at 01/06/23 0957   ipratropium-albuterol (DUONEB) 0.5-2.5 (3) MG/3ML nebulizer solution 3 mL  3 mL Nebulization Q6H PRN Hunsucker, Lesia Sago, MD       irbesartan (AVAPRO) tablet 150 mg  150 mg Per Tube Daily Hunsucker, Lesia Sago, MD   150 mg at 01/06/23 5732   nystatin (MYCOSTATIN) 100000 UNIT/ML suspension 500,000 Units  5 mL Oral QID Micki Riley, MD       ondansetron [facility]) injection 4 mg  4 mg Intravenous Q6H PRN de Saintclair Halsted, Cortney E, NP       Oral care mouth rinse  15 mL Mouth Rinse 4 times per day Rejeana Brock, MD   15 mL at 01/06/23 1228   Oral care mouth rinse  15 mL Mouth Rinse PRN Rejeana Brock, MD       spironolactone (ALDACTONE) tablet 12.5 mg  12.5 mg Per Tube Daily Hunsucker, Lesia Sago, MD   12.5 mg at 01/06/23 2025     Discharge Medications: Please see discharge summary for a list of discharge medications.  Relevant Imaging Results:  Relevant Lab Results:   Additional Information SS#: [id number]  Baldemar Lenis, LCSW

## 2023-01-06 NOTE — Plan of Care (Signed)
  Problem: Education: Goal: Knowledge of disease or condition will improve Outcome: Not Progressing   Problem: Ischemic Stroke/TIA Tissue Perfusion: Goal: Complications of ischemic stroke/TIA will be minimized Outcome: Not Progressing   Problem: Health Behavior/Discharge Planning: Goal: Goals will be collaboratively established with patient/family Outcome: Not Progressing   Problem: Self-Care: Goal: Ability to communicate needs accurately will improve Outcome: Not Progressing   Problem: Cardiovascular: Goal: Ability to achieve and maintain adequate cardiovascular perfusion will improve Outcome: Not Progressing Goal: Vascular access site(s) Level 0-1 will be maintained Outcome: Not Progressing   Problem: Activity: Goal: Ability to tolerate increased activity will improve Outcome: Not Progressing   Problem: Role Relationship: Goal: Method of communication will improve Outcome: Not Progressing   Problem: Clinical Measurements: Goal: Will remain free from infection Outcome: Not Progressing   Problem: Nutrition: Goal: Adequate nutrition will be maintained Outcome: Not Progressing   Problem: Coping: Goal: Level of anxiety will decrease Outcome: Not Progressing   Problem: Elimination: Goal: Will not experience complications related to bowel motility Outcome: Not Progressing Goal: Will not experience complications related to urinary retention Outcome: Not Progressing   Problem: Pain Management: Goal: General experience of comfort will improve Outcome: Not Progressing   Problem: Safety: Goal: Ability to remain free from injury will improve Outcome: Not Progressing

## 2023-01-06 NOTE — Consult Note (Addendum)
Jocelyn [name] 09-28-1931  [id number].    Requesting MD: Dr. Pearlean Brownie Chief Complaint/Reason for Consult: Dysphagia, PEG consult  HPI: Jocelyn [name] is a 89 y.o. female with a hx of GERD, prediabetes, permanent afibb on eliquis, HTN, HLD and OSA who presented on 11/28 after being found unresponsive. Upon arrival patient was confused and found to have left sided weakness. 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 Mechanical thrombectomy was performed by IR. Remained intubated post op. Extubated 11/29. Patient has been started on heparin. She has worked with . PT recommending SNF. She has failed her swallow evaluations and alternative means for feeding have been recommended.  She currently has a Cortrak but will need a g-tube for discharge.  Past Medical History: As above Prior Abdominal Surgeries: Abdominal Hysterectomy, Cholecystectomy Blood Thinners: Eliquis PTA. Currently on Heparin gtt Allergies: NKDA  ROS: ROS: unable as patient has trouble with dysarthria   Family History  Problem Relation Age of Onset   Diabetes Mother    Heart disease Mother    Cancer Father        lung; smoker   Hypertension Daughter    Diabetes Daughter    Cancer Daughter        breast   Anuerysm Son    Breast cancer Neg Hx     Past Medical History:  Diagnosis Date   Arthritis of both knees 08/01/2015   Colitis    Essential hypertension, benign 07/28/2014   GERD (gastroesophageal reflux disease)    Hx of iron deficiency anemia 09/02/2015   Hyperlipidemia    Hypertension    Lumbar pain    Medicare annual wellness visit, subsequent 04/07/2017   OSA on CPAP 08/01/2015   Overweight (BMI 25.0-29.9) 07/10/2013   Prediabetes 09/02/2015   SOB (shortness of breath) 07/10/2013    Past Surgical History:  Procedure Laterality Date   ABDOMINAL HYSTERECTOMY     CATARACT EXTRACTION     CHOLECYSTECTOMY     COLONOSCOPY WITH PROPOFOL N/A 04/09/2018   Procedure: COLONOSCOPY WITH PROPOFOL;  Surgeon: Toney Reil, MD;  Location: [facility] ENDOSCOPY;  Service: Gastroenterology;  Laterality: N/A;   IR CT HEAD LTD  12/29/2022   IR PERCUTANEOUS ART THROMBECTOMY/INFUSION INTRACRANIAL INC DIAG ANGIO  12/29/2022   IR US GUIDE VASC ACCESS RIGHT  12/29/2022   IR US GUIDE VASC ACCESS RIGHT  12/29/2022   LUMBAR DISC SURGERY     RADIOLOGY WITH ANESTHESIA N/A 12/29/2022   Procedure: IR WITH ANESTHESIA;  Surgeon: Radiologist, Medication, MD;  Location: [facility] OR;  Service: Radiology;  Laterality: N/A;    Social History:  reports that she has never smoked. She has never used smokeless tobacco. She reports that she does not drink alcohol and does not use drugs.  Allergies: No Known Allergies  Medications Prior to Admission  Medication Sig Dispense Refill   albuterol (VENTOLIN HFA) 108 (90 Base) MCG/ACT inhaler Inhale 1 puff into the lungs as needed.     alendronate (FOSAMAX) 70 MG tablet Take 70 mg by mouth once a week.     apixaban (ELIQUIS) 5 MG TABS tablet TAKE 1 TABLET BY MOUTH TWICE A DAY 60 tablet 5   atorvastatin (LIPITOR) 10 MG tablet Take 10 mg by mouth daily.     atorvastatin (LIPITOR) 40 MG tablet Take 40 mg by mouth daily.     calcium citrate-vitamin D 500-400 MG-UNIT chewable tablet Chew 1 tablet by mouth 2 (two) times daily.  clotrimazole (LOTRIMIN) 1 % cream Apply 1 Application topically 2 (two) times daily.     ferrous sulfate 325 (65 FE) MG tablet Take 1 tablet (325 mg total) by mouth daily. 30 tablet 11   furosemide (LASIX) 40 MG tablet Take 1 tablet (40 mg total) by mouth daily. 90 tablet 3   [EXPIRED] HYDROcodone-acetaminophen (NORCO/VICODIN) 5-325 MG tablet Take 1 tablet by mouth every 12 (twelve) hours as needed for severe pain (pain score 7-10). Must last 30 days. 60 tablet 0   HYDROcodone-acetaminophen (NORCO/VICODIN) 5-325 MG tablet Take 1 tablet by mouth every 12 (twelve) hours as needed for severe pain (pain score 7-10). Must last 30 days. 60 tablet 0   isosorbide mononitrate  (IMDUR) 120 MG 24 hr tablet TAKE 1 TABLET BY MOUTH EVERY DAY 90 tablet 0   Olmesartan-amLODIPine-HCTZ 40-10-12.5 MG TABS Take 1 tablet by mouth daily. For blood pressure 90 tablet 1   omeprazole (PRILOSEC) 20 MG capsule Take 20 mg by mouth daily.     spironolactone (ALDACTONE) 25 MG tablet Take 0.5 tablets (12.5 mg total) by mouth daily. 45 tablet 3     Physical Exam: Blood pressure (!) 157/59, pulse 96, temperature 98.2 F (36.8 C), temperature source Oral, resp. rate 18, height 5\' 3"  (1.6 m), weight 63.1 kg, SpO2 98%. General: pleasant, elderly female who is laying in bed in NAD HEENT: head is normocephalic, atraumatic.  Sclera are non-icteric. Ears and nose without any obvious masses or lesions.  Cortrak in place Heart: regular, rate, and rhythm.   Lungs: CTAB, no wheezes, rhonchi, or rales noted.  Respiratory effort nonlabored Abd: Soft, ND, NT, +BS. No masses, hernias, or organomegaly.  Scars from lower midline incision and lap chole noted   Results for orders placed or performed during the hospital encounter of 12/29/22 (from the past 48 hour(s))  Glucose, capillary     Status: Abnormal   Collection Time: 01/04/23  4:31 PM  Result Value Ref Range   Glucose-Capillary 115 (H) 70 - 99 mg/dL    Comment: Glucose reference range applies only to samples taken after fasting for at least 8 hours.  Glucose, capillary     Status: Abnormal   Collection Time: 01/04/23  7:54 PM  Result Value Ref Range   Glucose-Capillary 131 (H) 70 - 99 mg/dL    Comment: Glucose reference range applies only to samples taken after fasting for at least 8 hours.   Comment 1 Notify RN    Comment 2 Document in Chart   Heparin level (unfractionated)     Status: None   Collection Time: 01/04/23  9:27 PM  Result Value Ref Range   Heparin Unfractionated 0.43 0.30 - 0.70 IU/mL    Comment: (NOTE) The clinical reportable range upper limit is being lowered to >1.10 to align with the FDA approved guidance for the  current laboratory assay.  If heparin results are below expected values, and patient dosage has  been confirmed, suggest follow up testing of antithrombin III levels. Performed at [facility], [address], [place], [name] [address]   Glucose, capillary     Status: Abnormal   Collection Time: 01/04/23 11:42 PM  Result Value Ref Range   Glucose-Capillary 138 (H) 70 - 99 mg/dL    Comment: Glucose reference range applies only to samples taken after fasting for at least 8 hours.   Comment 1 Notify RN    Comment 2 Document in Chart   Glucose, capillary     Status:  Abnormal   Collection Time: 01/05/23  3:29 AM  Result Value Ref Range   Glucose-Capillary 114 (H) 70 - 99 mg/dL    Comment: Glucose reference range applies only to samples taken after fasting for at least 8 hours.   Comment 1 Notify RN    Comment 2 Document in Chart   CBC     Status: Abnormal   Collection Time: 01/05/23  6:32 AM  Result Value Ref Range   WBC 9.3 4.0 - 10.5 K/uL   RBC 4.46 3.87 - 5.11 MIL/uL   Hemoglobin 11.9 (L) 12.0 - 15.0 g/dL   HCT 75.6 43.3 - 29.5 %   MCV 84.5 80.0 - 100.0 fL   MCH 26.7 26.0 - 34.0 pg   MCHC 31.6 30.0 - 36.0 g/dL   RDW 18.8 41.6 - 60.6 %   Platelets 162 150 - 400 K/uL   nRBC 0.0 0.0 - 0.2 %    Comment: Performed at [facility], [address], [place], [name] [address]  Heparin level (unfractionated)     Status: None   Collection Time: 01/05/23  6:32 AM  Result Value Ref Range   Heparin Unfractionated 0.44 0.30 - 0.70 IU/mL    Comment: (NOTE) The clinical reportable range upper limit is being lowered to >1.10 to align with the FDA approved guidance for the current laboratory assay.  If heparin results are below expected values, and patient dosage has  been confirmed, suggest follow up testing of antithrombin III levels. Performed at [facility], [address] [place], [name] [address]   Glucose, capillary     Status: Abnormal   Collection Time:  01/05/23 10:10 AM  Result Value Ref Range   Glucose-Capillary 123 (H) 70 - 99 mg/dL    Comment: Glucose reference range applies only to samples taken after fasting for at least 8 hours.  Glucose, capillary     Status: None   Collection Time: 01/05/23 12:16 PM  Result Value Ref Range   Glucose-Capillary 94 70 - 99 mg/dL    Comment: Glucose reference range applies only to samples taken after fasting for at least 8 hours.  Glucose, capillary     Status: Abnormal   Collection Time: 01/05/23  4:22 PM  Result Value Ref Range   Glucose-Capillary 123 (H) 70 - 99 mg/dL    Comment: Glucose reference range applies only to samples taken after fasting for at least 8 hours.  Glucose, capillary     Status: Abnormal   Collection Time: 01/05/23  7:51 PM  Result Value Ref Range   Glucose-Capillary 137 (H) 70 - 99 mg/dL    Comment: Glucose reference range applies only to samples taken after fasting for at least 8 hours.   Comment 1 Notify RN    Comment 2 Document in Chart   Glucose, capillary     Status: Abnormal   Collection Time: 01/06/23 12:09 AM  Result Value Ref Range   Glucose-Capillary 145 (H) 70 - 99 mg/dL    Comment: Glucose reference range applies only to samples taken after fasting for at least 8 hours.   Comment 1 Notify RN    Comment 2 Document in Chart   Glucose, capillary     Status: Abnormal   Collection Time: 01/06/23  3:57 AM  Result Value Ref Range   Glucose-Capillary 157 (H) 70 - 99 mg/dL    Comment: Glucose reference range applies only to samples taken after fasting for at least 8 hours.  Comment 1 Notify RN    Comment 2 Document in Chart   Glucose, capillary     Status: Abnormal   Collection Time: 01/06/23  4:43 AM  Result Value Ref Range   Glucose-Capillary 141 (H) 70 - 99 mg/dL    Comment: Glucose reference range applies only to samples taken after fasting for at least 8 hours.  CBC     Status: Abnormal   Collection Time: 01/06/23  5:18 AM  Result Value Ref Range    WBC 11.1 (H) 4.0 - 10.5 K/uL   RBC 4.18 3.87 - 5.11 MIL/uL   Hemoglobin 11.4 (L) 12.0 - 15.0 g/dL   HCT 44.0 (L) 34.7 - 42.5 %   MCV 82.1 80.0 - 100.0 fL   MCH 27.3 26.0 - 34.0 pg   MCHC 33.2 30.0 - 36.0 g/dL   RDW 95.6 38.7 - 56.4 %   Platelets 196 150 - 400 K/uL   nRBC 0.0 0.0 - 0.2 %    Comment: Performed at [facility], [address], [place], [name] [address]  Heparin level (unfractionated)     Status: Abnormal   Collection Time: 01/06/23  5:18 AM  Result Value Ref Range   Heparin Unfractionated 0.72 (H) 0.30 - 0.70 IU/mL    Comment: (NOTE) The clinical reportable range upper limit is being lowered to >1.10 to align with the FDA approved guidance for the current laboratory assay.  If heparin results are below expected values, and patient dosage has  been confirmed, suggest follow up testing of antithrombin III levels. Performed at [facility], [address] [place], [name] [address]   Glucose, capillary     Status: Abnormal   Collection Time: 01/06/23  8:05 AM  Result Value Ref Range   Glucose-Capillary 113 (H) 70 - 99 mg/dL    Comment: Glucose reference range applies only to samples taken after fasting for at least 8 hours.   Comment 1 Notify RN   Glucose, capillary     Status: Abnormal   Collection Time: 01/06/23 11:38 AM  Result Value Ref Range   Glucose-Capillary 145 (H) 70 - 99 mg/dL    Comment: Glucose reference range applies only to samples taken after fasting for at least 8 hours.   Comment 1 Notify RN    No results found.  Anti-infectives (From admission, onward)    Start     Dose/Rate Route Frequency Ordered Stop   12/31/22 1100  cefTRIAXone (ROCEPHIN) 2 g in sodium chloride 0.9 % 100 mL IVPB        2 g 200 mL/hr over 30 Minutes Intravenous Every 24 hours 12/31/22 1037 01/06/23 1119       Assessment/Plan Dysphagia s/p CVA I discussed with family Jocelyn [name], daughter) on the phone including indications and risks. Risk includes but  are not limited to anesthesia (MI, CVA, Death, aspiration), bleeding, pain, infection, scarring, injury to surrounding structures and PEG tube complications (leakage, skin irritation, dislodgement and possible need for exchanges). Discussed that PEG tube will need to stay in place for at least 6-8 weeks but can stay in place long term if needed. Discussed early removal of PEG tube can lead to gastric leak with need for repeat operation. The patient has 4 children and will need to make this decision together.  We will have a conference call with the children on Monday at 0800.  Jocelyn [name] is in agreement to move forward  as of right now.  We have the patient  tentatively scheduled for Monday at 1000am in endo for PEG tube placement as long as children agree.  Will hold blood thinners and TF's at midnight prior to the procedure.  This has been ordered already.  FEN - Cortrak, TFs, stop after MN on 12/9 VTE - SCDs, Heparin gtt. Will need to be stopped 4 hours before procedure, Monday at 0600am ID - Rocephin completed for aspiration  I reviewed Consultant neurology notes, last 24 h vitals and pain scores, last 48 h intake and output, last 24 h labs and trends, and last 24 h imaging results.  Coordination of care between the patient's family was completed as well as arranging case with endoscopy suite.  I also spoke with case manager regarding insurance approval to [facility]) which was denied.  This was necessary as if LTACH was approved, the family would want to hold on PEG tube placement.  This was denied and therefore, family agreeable to move forward to help with SNF placement.  Letha Cape , [facility] 01/06/2023, 2:08 PM Please see Amion for pager number during day hours 7:00am-4:30pm

## 2023-01-06 NOTE — Progress Notes (Signed)
Nutrition Follow-up  DOCUMENTATION CODES:   Not applicable  INTERVENTION:   - Continue tube feeds via Cortrak of: Osmolite 1.2 @ 3ml/hr- Initiate at 54ml/hr and increase by 63ml/hr q 8 hours until goal rate is each.    Free water flushes 30ml q4 hours to maintain tube patency    Regimen provides 1584kcal/day, 73g/day protein and 1271ml/day of free water.    - Provide Banatrol BID   NUTRITION DIAGNOSIS:   Inadequate oral intake related to acute illness as evidenced by NPO status.  - Ongoing  GOAL:   Patient will meet greater than or equal to 90% of their needs  - Meeting via TF  MONITOR:   Labs, Weight trends, TF tolerance, I & O's, Skin  REASON FOR ASSESSMENT:   Consult Enteral/tube feeding initiation and management  ASSESSMENT:   89 y/o female with h/o HTN, HLD, DDD, OSA, PAF, IDA, chronic pain, CAD and GERD who is admitted with right ACA and right MCA occlusions s/p mechanical thrombectomy 11/28.  11/29: Cortrak placed  12/1: transferred out of ICU   TF running at goal rate of Osmolite 1.5 @ 55 ml/hr and pt tolerating well. Keep NPO per SLP.   Pt laying in bed awake but not able to answer questions. Granddaughter in room on visit, she states pt was living alone and able to care for herself. She does not know what she ate only that she had a big appetite. Family not concerned for weight loss. Pt was able to walk with a walker PTA but has back and knee problems.   Pt has been having loose stools, will add Banatrol. Weight has been trending down.   Admit weight: 68.6 kg  Current weight: 63.1 kg    Average Meal Intake: NPO  Nutritionally Relevant Medications: Scheduled Meds:  atorvastatin  40 mg Per Tube Daily   famotidine  20 mg Per Tube Daily   fentaNYL (SUBLIMAZE) injection  25 mcg Intravenous Once   irbesartan  150 mg Per Tube Daily   nystatin  5 mL Oral QID   mouth rinse  15 mL Mouth Rinse 4 times per day   spironolactone  12.5 mg Per Tube Daily    Continuous Infusions:  feeding supplement (OSMOLITE 1.2 CAL) 55 mL/hr at 01/05/23 1600   heparin 1,400 Units/hr (01/06/23 0957)   Labs Reviewed: No pertinent labs   Diet Order:   Diet Order             Diet NPO time specified  Diet effective now                   EDUCATION NEEDS:   No education needs have been identified at this time  Skin:  Skin Assessment: Reviewed RN Assessment (incision)  Last BM:  01/06/2023, type 6, medium  Height:   Ht Readings from Last 1 Encounters:  01/02/23 5\' 3"  (1.6 m)    Weight:   Wt Readings from Last 1 Encounters:  01/06/23 63.1 kg    Ideal Body Weight:  52.2 kg  BMI:  Body mass index is 24.64 kg/m.  Estimated Nutritional Needs:   Kcal:  1400-1600kcal/day  Protein:  70-80g/day  Fluid:  1.4-1.6L/day   Elliot Dally, RD Registered Dietitian  See Amion for more information

## 2023-01-06 NOTE — Progress Notes (Signed)
PHARMACY - ANTICOAGULATION CONSULT NOTE  Pharmacy Consult for heparin  Indication: atrial fibrillation  No Known Allergies  Patient Measurements: Height: 5\' 3"  (160 cm) Weight: 63.1 kg (139 lb 1.8 oz) IBW/kg (Calculated) : 52.4 Heparin Dosing Weight: 65.5  Vital Signs: Temp: 98.3 F (36.8 C) (12/06 1518) Temp Source: Oral (12/06 1518) BP: 128/44 (12/06 1518) Pulse Rate: 89 (12/06 1518)  Labs: Recent Labs    01/04/23 1200 01/04/23 2127 01/05/23 0632 01/06/23 0518 01/06/23 1526  HGB 11.7*  --  11.9* 11.4*  --   HCT 36.4  --  37.7 34.3*  --   PLT 145*  --  162 196  --   HEPARINUNFRC <0.10*   < > 0.44 0.72* >1.10*  CREATININE 0.64  --   --   --   --    < > = values in this interval not displayed.    Estimated Creatinine Clearance: 42.7 mL/min (by C-G formula based on SCr of 0.64 mg/dL).    Assessment: 33 YOF presenting with stroke 11/28.  Patient with a PMH of atrial fibrillation on Eliquis PTA - LD unknown. Pharmacy consulted to dose heparin.   Heparin level continues to trend up to > 1.1 units/mL on 1400 units/hr.  Confirmed with RN that lab was drawn appropriately; no bleeding reported.  Goal of Therapy:  Heparin level 0.3-0.5 units/mL with recent CVA Monitor platelets by anticoagulation protocol: Yes   Plan:  Hold heparin for 1 hr (done ~1723 by RN), then Resume heparin gtt at 1100 units/hr Check 8 hr heparin level F/U restart apixaban   Sung Parodi D. Laney Potash, PharmD, BCPS, BCCCP 01/06/2023, 5:26 PM

## 2023-01-06 NOTE — Progress Notes (Signed)
PHARMACY - ANTICOAGULATION CONSULT NOTE  Pharmacy Consult for heparin Indication:  Afib in setting of acute stroke  Labs: Recent Labs    01/04/23 1200 01/04/23 2127 01/05/23 0632 01/06/23 0518  HGB 11.7*  --  11.9* 11.4*  HCT 36.4  --  37.7 34.3*  PLT 145*  --  162 196  HEPARINUNFRC <0.10* 0.43 0.44 0.72*  CREATININE 0.64  --   --   --    Assessment: 89yo female supratherapeutic on heparin after two levels at goal; no infusion issues or signs of bleeding per RN.  Goal of Therapy:  Heparin level 0.3-0.5 units/ml   Plan:  Decrease heparin infusion by 15% to 1400 units/hr. Check level in 8 hours.   Vernard Gambles, PharmD, BCPS 01/06/2023 6:49 AM

## 2023-01-06 NOTE — Progress Notes (Signed)
Transition of Care [facility]) - CAGE-AID Screening   Patient Details  Name: Jocelyn [name] MRN: [id number] Date of Birth: 07-Jan-1932  Hewitt Shorts, RN Trauma Response Nurse Phone Number: [phone] 01/06/2023, 2:50 PM   CAGE-AID Screening: Substance Abuse Screening unable to be completed due to: : (S) Patient unable to participate (oriented to person, communicates with nods.)

## 2023-01-07 DIAGNOSIS — I639 Cerebral infarction, unspecified: Secondary | ICD-10-CM | POA: Diagnosis not present

## 2023-01-07 LAB — HEPARIN LEVEL (UNFRACTIONATED)
Heparin Unfractionated: 0.99 [IU]/mL — ABNORMAL HIGH (ref 0.30–0.70)
Heparin Unfractionated: 0.43 [IU]/mL (ref 0.30–0.70)
Heparin Unfractionated: 0.22 [IU]/mL — ABNORMAL LOW (ref 0.30–0.70)

## 2023-01-07 LAB — GLUCOSE, CAPILLARY
Glucose-Capillary: 146 mg/dL — ABNORMAL HIGH (ref 70–99)
Glucose-Capillary: 156 mg/dL — ABNORMAL HIGH (ref 70–99)
Glucose-Capillary: 160 mg/dL — ABNORMAL HIGH (ref 70–99)
Glucose-Capillary: 131 mg/dL — ABNORMAL HIGH (ref 70–99)
Glucose-Capillary: 165 mg/dL — ABNORMAL HIGH (ref 70–99)

## 2023-01-07 LAB — BASIC METABOLIC PANEL
Anion gap: 7 (ref 5–15)
BUN: 26 mg/dL — ABNORMAL HIGH (ref 8–23)
CO2: 24 mmol/L (ref 22–32)
Calcium: 8.9 mg/dL (ref 8.9–10.3)
Chloride: 106 mmol/L (ref 98–111)
Creatinine, Ser: 0.69 mg/dL (ref 0.44–1.00)
GFR, Estimated: >60 mL/min (ref >60)
Glucose, Bld: 157 mg/dL — ABNORMAL HIGH (ref 70–99)
Potassium: 4.7 mmol/L (ref 3.5–5.1)
Sodium: 137 mmol/L (ref 135–145)

## 2023-01-07 LAB — CBC
HCT: 35 % — ABNORMAL LOW (ref 36.0–46.0)
Hemoglobin: 11.4 g/dL — ABNORMAL LOW (ref 12.0–15.0)
MCH: 27 pg (ref 26.0–34.0)
MCHC: 32.6 g/dL (ref 30.0–36.0)
MCV: 82.9 fL (ref 80.0–100.0)
Platelets: 209 10*3/uL (ref 150–400)
RBC: 4.22 MIL/uL (ref 3.87–5.11)
RDW: 13.8 % (ref 11.5–15.5)
WBC: 11.8 10*3/uL — ABNORMAL HIGH (ref 4.0–10.5)
nRBC: 0 % (ref 0.0–0.2)

## 2023-01-07 MED ORDER — AMLODIPINE BESYLATE 5 MG PO TABS
10.0000 mg | ORAL_TABLET | Freq: Every day | ORAL | Status: DC
  Administered 2023-01-07 – 2023-01-11 (×5): 10 mg
  Filled 2023-01-07 (×4): qty 2
  Filled 2023-01-07: qty 4

## 2023-01-07 MED ORDER — HEPARIN (PORCINE) 25000 UT/250ML-% IV SOLN: 1050.0000 [IU]/h | INTRAVENOUS | Status: DC

## 2023-01-07 MED ORDER — HYDROCHLOROTHIAZIDE 12.5 MG PO TABS
12.5000 mg | ORAL_TABLET | Freq: Every day | ORAL | Status: DC
  Administered 2023-01-07 – 2023-01-11 (×5): 12.5 mg
  Filled 2023-01-07 (×5): qty 1

## 2023-01-07 MED ORDER — HEPARIN (PORCINE) 25000 UT/250ML-% IV SOLN
900.0000 [IU]/h | INTRAVENOUS | Status: DC
  Administered 2023-01-07: 900 [IU]/h via INTRAVENOUS
  Filled 2023-01-07: qty 250

## 2023-01-07 NOTE — Progress Notes (Signed)
PHARMACY - ANTICOAGULATION CONSULT NOTE  Pharmacy Consult for heparin Indication:  Afib in setting of acute stroke  Labs: Recent Labs    01/04/23 1200 01/04/23 2127 01/05/23 0632 01/06/23 0518 01/06/23 1526 01/07/23 0031  HGB 11.7*  --  11.9* 11.4*  --   --   HCT 36.4  --  37.7 34.3*  --   --   PLT 145*  --  162 196  --   --   HEPARINUNFRC <0.10*   < > 0.44 0.72* >1.10* 0.99*  CREATININE 0.64  --   --   --   --   --    < > = values in this interval not displayed.   Assessment: 89yo female remains supratherapeutic on heparin after rate changes; no infusion issues or signs of bleeding per RN.  Goal of Therapy:  Heparin level 0.3-0.5 units/ml   Plan:  Hold heparin infusion x21min. Decrease heparin infusion by 3-4 units/kg/hr to 900 units/hr. Check level in 8 hours.   Vernard Gambles, PharmD, BCPS 01/07/2023 1:24 AM

## 2023-01-07 NOTE — Plan of Care (Signed)
  Problem: Education: Goal: Knowledge of disease or condition will improve Outcome: Progressing Goal: Knowledge of secondary prevention will improve (MUST DOCUMENT ALL) Outcome: Progressing Goal: Knowledge of patient specific risk factors will improve Loraine Leriche N/A or DELETE if not current risk factor) Outcome: Progressing   Problem: Ischemic Stroke/TIA Tissue Perfusion: Goal: Complications of ischemic stroke/TIA will be minimized Outcome: Progressing   Problem: Coping: Goal: Will verbalize positive feelings about self Outcome: Progressing Goal: Will identify appropriate support needs Outcome: Progressing   Problem: Health Behavior/Discharge Planning: Goal: Ability to manage health-related needs will improve Outcome: Progressing Goal: Goals will be collaboratively established with patient/family Outcome: Progressing   Problem: Self-Care: Goal: Ability to participate in self-care as condition permits will improve Outcome: Progressing Goal: Verbalization of feelings and concerns over difficulty with self-care will improve Outcome: Progressing Goal: Ability to communicate needs accurately will improve Outcome: Progressing   Problem: Nutrition: Goal: Risk of aspiration will decrease Outcome: Progressing Goal: Dietary intake will improve Outcome: Progressing   Problem: Education: Goal: Understanding of CV disease, CV risk reduction, and recovery process will improve Outcome: Progressing Goal: Individualized Educational Video(s) Outcome: Progressing

## 2023-01-07 NOTE — Progress Notes (Addendum)
STROKE TEAM PROGRESS NOTE   BRIEF HPI [name] is a 89 y.o. female with history of  GERD, prediabetes, permanent afibb on eliquis, HTN, HLD, OSA, also has a history of atrial fibrillation on Eliquis and appears to have been compliant with her medications.  She presented today after her brother found her unresponsive and thought that she was not breathing.  However, upon arrival of EMS, she was moving her right side and attempting to talk, although she was confused.  She was noted to have left-sided weakness, and code stroke was activated.   CT shows no acute abnormalities, acute clot at the right ICA Bifurcation and acute obstructing right MCA M2 clot was seen on CT angiogram with large penumbra on CT perfusion.  NIH on Admission 19  SIGNIFICANT HOSPITAL EVENTS 11/28.  Admitted.  CT shows no acute abnormalities, acute clot at the right ICA Bifurcation and acute obstructing right MCA M2 clot was seen on CTA with large penumbra on CT perfusion.Underwent medical thrombectomy of right MCA occlusion 12/1: transferred out of ICU 12/6: Insurance declined LTAC  INTERIM HISTORY/SUBJECTIVE  No family at bedside. Neurology neurological exam stable.  Vital signs stable. No acute events overnight. Tentative plan for PEG tube insertion on Monday, awaiting family's decision. Will continue Heparin IV.    OBJECTIVE  CBC    Component Value Date/Time   WBC 11.8 (H) 01/07/2023 0439   RBC 4.22 01/07/2023 0439   HGB 11.4 (L) 01/07/2023 0439   HGB 12.5 09/19/2022 0916   HCT 35.0 (L) 01/07/2023 0439   HCT 39.6 09/19/2022 0916   PLT 209 01/07/2023 0439   PLT 169 09/19/2022 0916   MCV 82.9 01/07/2023 0439   MCV 87 09/19/2022 0916   MCV 82 04/13/2011 1125   MCH 27.0 01/07/2023 0439   MCHC 32.6 01/07/2023 0439   RDW 13.8 01/07/2023 0439   RDW 12.9 09/19/2022 0916   RDW 14.4 04/13/2011 1125   LYMPHSABS 1.1 12/29/2022 1706   LYMPHSABS 3.7 (H) 07/08/2015 1136   MONOABS 0.2 12/29/2022 1706    EOSABS 0.0 12/29/2022 1706   EOSABS 0.2 07/08/2015 1136   BASOSABS 0.0 12/29/2022 1706   BASOSABS 0.1 07/08/2015 1136    BMET    Component Value Date/Time   NA 137 01/07/2023 0439   NA 143 12/07/2022 1535   NA 141 04/13/2011 1125   K 4.7 01/07/2023 0439   K 3.4 (L) 04/13/2011 1125   CL 106 01/07/2023 0439   CL 105 04/13/2011 1125   CO2 24 01/07/2023 0439   CO2 29 04/13/2011 1125   GLUCOSE 157 (H) 01/07/2023 0439   GLUCOSE 111 (H) 04/13/2011 1125   BUN 26 (H) 01/07/2023 0439   BUN 16 12/07/2022 1535   BUN 14 04/13/2011 1125   CREATININE 0.69 01/07/2023 0439   CREATININE 0.80 08/01/2018 1544   CALCIUM 8.9 01/07/2023 0439   CALCIUM 9.2 04/13/2011 1125   EGFR 74 12/07/2022 1535   GFRNONAA >60 01/07/2023 0439   GFRNONAA 67 08/01/2018 1544    IMAGING past 24 hours No results found.  Vitals:   01/06/23 2332 01/07/23 0406 01/07/23 0413 01/07/23 0835  BP: 126/73 134/80  (!) 160/70  Pulse: 91 94  (!) 104  Resp: 16 18  18   Temp: 98.5 F (36.9 C) 98.7 F (37.1 C)  98.1 F (36.7 C)  TempSrc: Oral Oral  Oral  SpO2: 96% 100%  99%  Weight:   63.7 kg   Height:  PHYSICAL EXAM General: Critically ill elderly lady, NAD CV: Irregularly irregular on monitor Respiratory:  Regular, unlabored respirations on room air GI: Abdomen soft and nontender  NEURO:  Mental Status: Awake and alert. She follows simple commands, and speech is mumbled and quiet, but she is able to say her name and where she is and that she had a stroke.   Cranial Nerves:  II: PERRL.  No blink to threat on left III, IV, VI: EOMI: continued slight rightward gaze. Eyelids elevate symmetrically.  V: Sensation is intact to light touch and symmetrical to face.  VII: left facial droop VIII: hearing intact to voice. IX, X: Able to assess XI: Able to assess XII: tongue is midline without fasciculations. Motor:    Dense left hemiplegia with flaccid left arm.  Able to slightly wiggle left toes.   Spontaneous movement against resistance 4/5 of right arm, against gravity 4/5 movement of right leg Tone: is normal and bulk is normal Sensation- continued decreased sensation on left  Coordination: Unable to assess Gait- deferred  Most Recent NIH: 18  ASSESSMENT/PLAN  Acute Ischemic Infarct:  right MCA territory infarct with R M2 and A1 occlusion s/p IR with TICI 3 revascularization, etiology: Cardioembolic in the setting of A-fib Code Stroke  CT head  -hyperdense right MCA, cytotoxic edema in right stratum and insula.  ASPECTS 7   CTA head & neck acute clot at the right terminal ICA and acute obstructing right MCA M2 and ACA A1. CT perfusion 6/149 in the right MCA territory.  S/p IR via right ICA direct access with TICI3 MRI scattered acute right MCA distribution infarcts involving the right caudate and lentiform nuclei, right insula, and overlying right cerebral hemisphere, small volume petechial hemorrhage at the posterior right frontal region MRA  with interval revascularization of previously seen clot at the right ICA terminus and right M2 segment, Focal moderate distal right P2 stenosis. 2D Echo EF 60 to 65%.  LV severe concentric hypertrophy.  LA severely dilated LDL 33 HgbA1c 6.4 VTE prophylaxis - Heparin IV Eliquis prior to admission, continue heparin IV. Therapy recommendations: SNF Disposition: Pending  Atrial fibrillation Home Meds: Eliquis On tele Rate controlled on heparin IV.  ? Cerebral aneurysm MRA head 3-4 mm outpouching arising from the supraclinoid left ICA, suspicious for a left PCOM aneurysm CTA head did not report aneurysm Yearly MRA follow up as outpt  Hypertensive Emergency Home meds: Imdur 120 mg, olmesartan-amlodipine-HCTZ 40-10-12.5 mg, spironolactone 25 mg On Avapro 150 mg daily, spironolactone 12.5 Add amlodipine 10 and hydrochlorothiazide 12.5 BP goal: SBP less than 180 Long-term BP goal normotensive  Hyperlipidemia Home meds: Lipitor 40  mg, resumed in hospital LDL 33, goal < 70 Continue statin at discharge  Acute respiratory failure post procedure Hypoxia  Concern for Aspiration Pneumonia  Extubated on 11/29 CXR 11/30: left lung opacification, likely mucous plug. 40mg  Lasix x 1 on 11/30  Completed 7-day course of ceftriaxone  Dysphagia Patient has post-stroke dysphagia, SLP consulted Continue NPO Core track tube in place  Continue tube feedings@ 55 Tentative plan for PEG Monday, awaiting family decision.   Other Stroke Risk Factors  Obstructive sleep apnea,  on CPAP at home  Advanced age  Other acute issues Leukocytosis WBC 9.3-- 11.1   Hospital day # 9   Pt seen by Neuro NP/APP and later by MD. Note/plan to be edited by MD as needed.    Lynnae January, DNP, AGACNP-BC [facility] Please use AMION for contact information & EPIC  for messaging.  ATTENDING NOTE: I reviewed above note and agree with the assessment and plan. Pt was seen and examined.   No family at bedside.  Patient lying in bed, still has left hemiplegia, left neglect, and left hemianopia.  Currently on heparin IV in preparation for PEG tube on Monday.  BP on the higher end, continue Avapro and spironolactone, add home amlodipine and HCTZ.  PT and OT recommend SNF.  For detailed assessment and plan, please refer to above/below as I have made changes wherever appropriate.   Marvel Plan, MD PhD Stroke Neurology 01/07/2023 6:21 PM

## 2023-01-07 NOTE — Progress Notes (Signed)
PHARMACY - ANTICOAGULATION CONSULT NOTE  Pharmacy Consult for heparin  Indication: atrial fibrillation  No Known Allergies  Patient Measurements: Height: 5\' 3"  (160 cm) Weight: 63.7 kg (140 lb 6.9 oz) IBW/kg (Calculated) : 52.4 Heparin Dosing Weight: 65.5  Vital Signs: Temp: 98.1 F (36.7 C) (12/07 0835) Temp Source: Oral (12/07 0835) BP: 160/70 (12/07 0835) Pulse Rate: 104 (12/07 0835)  Labs: Recent Labs    01/05/23 0632 01/06/23 0518 01/06/23 1526 01/07/23 0031 01/07/23 0439 01/07/23 1003  HGB 11.9* 11.4*  --   --  11.4*  --   HCT 37.7 34.3*  --   --  35.0*  --   PLT 162 196  --   --  209  --   HEPARINUNFRC 0.44 0.72* >1.10* 0.99*  --  0.43  CREATININE  --   --   --   --  0.69  --     Estimated Creatinine Clearance: 42.8 mL/min (by C-G formula based on SCr of 0.69 mg/dL).    Assessment: 78 YOF presenting with stroke 11/28.  Patient with a PMH of atrial fibrillation on Eliquis PTA - LD unknown. Pharmacy consulted to dose heparin.   Heparin level 0.43 after rate decrease to 900 units/hr.  No overt s/sx of bleeding reported  Goal of Therapy:  Heparin level 0.3-0.5 units/mL with recent CVA Monitor platelets by anticoagulation protocol: Yes   Plan:  Continue heparin gtt at 900 units/hr Check 8 hr heparin level F/U restart apixaban   Ruben Im, PharmD Clinical Pharmacist 01/07/2023 12:03 PM Please check AMION for all [facility] Pharmacy numbers

## 2023-01-07 NOTE — Progress Notes (Signed)
PHARMACY - ANTICOAGULATION CONSULT NOTE  Pharmacy Consult for heparin  Indication: atrial fibrillation  No Known Allergies  Patient Measurements: Height: 5\' 3"  (160 cm) Weight: 63.7 kg (140 lb 6.9 oz) IBW/kg (Calculated) : 52.4 Heparin Dosing Weight: 65.5  Vital Signs: Temp: 97.6 F (36.4 C) (12/07 2036) Temp Source: Oral (12/07 2036) BP: 149/70 (12/07 2052) Pulse Rate: 84 (12/07 2036)  Labs: Recent Labs    01/05/23 1610 01/06/23 0518 01/06/23 1526 01/07/23 0031 01/07/23 0439 01/07/23 1003 01/07/23 2042  HGB 11.9* 11.4*  --   --  11.4*  --   --   HCT 37.7 34.3*  --   --  35.0*  --   --   PLT 162 196  --   --  209  --   --   HEPARINUNFRC 0.44 0.72*   < > 0.99*  --  0.43 0.22*  CREATININE  --   --   --   --  0.69  --   --    < > = values in this interval not displayed.    Estimated Creatinine Clearance: 42.8 mL/min (by C-G formula based on SCr of 0.69 mg/dL).    Assessment: 6 YOF presenting with stroke 11/28.  Patient with a PMH of atrial fibrillation on Eliquis PTA - LD unknown. Pharmacy consulted to dose heparin.   Heparin level 0.22 (subtherapeutic) on 900 units/hr.  No issues with the infusion or overt s/sx of bleeding reported per RN.  Goal of Therapy:  Heparin level 0.3-0.5 units/mL with recent CVA Monitor platelets by anticoagulation protocol: Yes   Plan:  Increase heparin gtt to 950 units/hr Check 8 hr heparin level F/U restart apixaban   Loralee Pacas, PharmD, BCPS Clinical Pharmacist 01/07/2023 9:57 PM Please check AMION for all [facility] Pharmacy numbers

## 2023-01-08 DIAGNOSIS — I639 Cerebral infarction, unspecified: Secondary | ICD-10-CM | POA: Diagnosis not present

## 2023-01-08 LAB — CBC
HCT: 36 % (ref 36.0–46.0)
Hemoglobin: 11.6 g/dL — ABNORMAL LOW (ref 12.0–15.0)
MCH: 27 pg (ref 26.0–34.0)
MCHC: 32.2 g/dL (ref 30.0–36.0)
MCV: 83.9 fL (ref 80.0–100.0)
Platelets: 187 10*3/uL (ref 150–400)
RBC: 4.29 MIL/uL (ref 3.87–5.11)
RDW: 14 % (ref 11.5–15.5)
WBC: 11.5 10*3/uL — ABNORMAL HIGH (ref 4.0–10.5)
nRBC: 0 % (ref 0.0–0.2)

## 2023-01-08 LAB — GLUCOSE, CAPILLARY
Glucose-Capillary: 126 mg/dL — ABNORMAL HIGH (ref 70–99)
Glucose-Capillary: 141 mg/dL — ABNORMAL HIGH (ref 70–99)
Glucose-Capillary: 142 mg/dL — ABNORMAL HIGH (ref 70–99)
Glucose-Capillary: 145 mg/dL — ABNORMAL HIGH (ref 70–99)
Glucose-Capillary: 151 mg/dL — ABNORMAL HIGH (ref 70–99)
Glucose-Capillary: 153 mg/dL — ABNORMAL HIGH (ref 70–99)
Glucose-Capillary: 166 mg/dL — ABNORMAL HIGH (ref 70–99)
Glucose-Capillary: 184 mg/dL — ABNORMAL HIGH (ref 70–99)

## 2023-01-08 LAB — BASIC METABOLIC PANEL
Anion gap: 9 (ref 5–15)
BUN: 29 mg/dL — ABNORMAL HIGH (ref 8–23)
CO2: 22 mmol/L (ref 22–32)
Calcium: 9 mg/dL (ref 8.9–10.3)
Chloride: 108 mmol/L (ref 98–111)
Creatinine, Ser: 0.78 mg/dL (ref 0.44–1.00)
GFR, Estimated: >60 mL/min (ref >60)
Glucose, Bld: 162 mg/dL — ABNORMAL HIGH (ref 70–99)
Potassium: 5.1 mmol/L (ref 3.5–5.1)
Sodium: 139 mmol/L (ref 135–145)

## 2023-01-08 LAB — HEPARIN LEVEL (UNFRACTIONATED)
Heparin Unfractionated: 0.24 [IU]/mL — ABNORMAL LOW (ref 0.30–0.70)
Heparin Unfractionated: 0.21 [IU]/mL — ABNORMAL LOW (ref 0.30–0.70)

## 2023-01-08 MED ORDER — FREE WATER
100.0000 mL | Status: DC
  Administered 2023-01-08 – 2023-01-11 (×15): 100 mL

## 2023-01-08 MED ORDER — FREE WATER: 100.0000 mL | Freq: Four times a day (QID) | Status: DC

## 2023-01-08 MED ORDER — HEPARIN (PORCINE) 25000 UT/250ML-% IV SOLN
1250.0000 [IU]/h | INTRAVENOUS | Status: AC
  Administered 2023-01-08: 1150 [IU]/h via INTRAVENOUS
  Filled 2023-01-08: qty 250

## 2023-01-08 MED ORDER — WHITE PETROLATUM EX OINT
TOPICAL_OINTMENT | CUTANEOUS | Status: DC | PRN
  Filled 2023-01-08 (×2): qty 28.35

## 2023-01-08 NOTE — Progress Notes (Addendum)
PHARMACY - ANTICOAGULATION CONSULT NOTE  Pharmacy Consult for heparin  Indication: atrial fibrillation  No Known Allergies  Patient Measurements: Height: 5\' 3"  (160 cm) Weight: 63.7 kg (140 lb 6.9 oz) IBW/kg (Calculated) : 52.4 Heparin Dosing Weight: 65.5  Vital Signs: Temp: 98.1 F (36.7 C) (12/08 0451) Temp Source: Oral (12/08 0451) BP: 132/74 (12/08 0451) Pulse Rate: 96 (12/08 0451)  Labs: Recent Labs    01/06/23 0518 01/06/23 1526 01/07/23 0439 01/07/23 1003 01/07/23 2042 01/08/23 0639  HGB 11.4*  --  11.4*  --   --   --   HCT 34.3*  --  35.0*  --   --   --   PLT 196  --  209  --   --   --   HEPARINUNFRC 0.72*   < >  --  0.43 0.22* 0.24*  CREATININE  --   --  0.69  --   --   --    < > = values in this interval not displayed.    Estimated Creatinine Clearance: 42.8 mL/min (by C-G formula based on SCr of 0.69 mg/dL).    Assessment: 70 YOF presenting with stroke 11/28.  Patient with a PMH of atrial fibrillation on Eliquis PTA - LD unknown. Pharmacy consulted to dose heparin.   Heparin level 0.24 (subtherapeutic) on 950 units/hr.  No issues with the infusion or overt s/sx of bleeding reported per RN.  Goal of Therapy:  Heparin level 0.3-0.5 units/mL with recent CVA Monitor platelets by anticoagulation protocol: Yes   Plan:  Increase heparin gtt to 1050 units/hr Check 8 hr heparin level F/U restart apixaban   Ruben Im, PharmD Clinical Pharmacist 01/08/2023 7:31 AM Please check AMION for all [facility] Pharmacy numbers

## 2023-01-08 NOTE — Progress Notes (Signed)
PHARMACY - ANTICOAGULATION CONSULT NOTE  Pharmacy Consult for heparin  Indication: atrial fibrillation  No Known Allergies  Patient Measurements: Height: 5\' 3"  (160 cm) Weight: 62.5 kg (137 lb 12.6 oz) IBW/kg (Calculated) : 52.4 Heparin Dosing Weight: 65.5  Vital Signs: Temp: 99.5 F (37.5 C) (12/08 1621) Temp Source: Oral (12/08 1621) BP: 167/64 (12/08 1621) Pulse Rate: 95 (12/08 1621)  Labs: Recent Labs    01/06/23 0518 01/06/23 1526 01/07/23 0439 01/07/23 1003 01/07/23 2042 01/08/23 0639 01/08/23 1551  HGB 11.4*  --  11.4*  --   --  11.6*  --   HCT 34.3*  --  35.0*  --   --  36.0  --   PLT 196  --  209  --   --  187  --   HEPARINUNFRC 0.72*   < >  --    < > 0.22* 0.24* 0.21*  CREATININE  --   --  0.69  --   --  0.78  --    < > = values in this interval not displayed.    Estimated Creatinine Clearance: 39.4 mL/min (by C-G formula based on SCr of 0.78 mg/dL).    Assessment: 17 YOF presenting with stroke 11/28.  Patient with a PMH of atrial fibrillation on Eliquis PTA - LD unknown. Pharmacy consulted to dose heparin.   Heparin level 0.21 (subtherapeutic) despite rate increase to 1050 units/hr.  No issues with the infusion (pauses, interruptions or infiltration) or overt s/sx of bleeding reported per RN.  Goal of Therapy:  Heparin level 0.3-0.5 units/mL with recent CVA Monitor platelets by anticoagulation protocol: Yes   Plan:  Increase heparin gtt to 1150 units/hr Check 8 hr heparin level Hold heparin at 0600 tomorrow for PEG tube placement  Loralee Pacas, PharmD, BCPS Clinical Pharmacist 01/08/2023 4:22 PM Please check AMION for all [facility] Pharmacy numbers

## 2023-01-08 NOTE — Anesthesia Preprocedure Evaluation (Signed)
Anesthesia Evaluation  Patient identified by MRN, date of birth, ID band Patient awake and Patient confused    Reviewed: Allergy & Precautions, NPO status , Patient's Chart, lab work & pertinent test results  History of Anesthesia Complications Negative for: history of anesthetic complications  Airway Mallampati: II  TM Distance: >3 FB Neck ROM: Full    Dental  (+) Edentulous Lower, Edentulous Upper   Pulmonary sleep apnea and Continuous Positive Airway Pressure Ventilation    Pulmonary exam normal        Cardiovascular hypertension, + CAD  Normal cardiovascular exam     Neuro/Psych 11/28 acute right ICA occlusion s/p thrombectomy   CVA, Residual Symptoms  negative psych ROS   GI/Hepatic Neg liver ROS,GERD  Medicated,,  Endo/Other  negative endocrine ROS    Renal/GU negative Renal ROS     Musculoskeletal  (+) Arthritis ,    Abdominal   Peds  Hematology  (+) Blood dyscrasia (Hgb 11.6), anemia   Anesthesia Other Findings Day of surgery medications reviewed with patient.  Reproductive/Obstetrics                              Anesthesia Physical Anesthesia Plan  ASA: 4  Anesthesia Plan: General   Post-op Pain Management: Minimal or no pain anticipated   Induction:   PONV Risk Score and Plan: 3 and Treatment may vary due to age or medical condition, Ondansetron and Dexamethasone  Airway Management Planned: Oral ETT  Additional Equipment: None  Intra-op Plan:   Post-operative Plan: Extubation in OR  Informed Consent: I have reviewed the patients History and Physical, chart, labs and discussed the procedure including the risks, benefits and alternatives for the proposed anesthesia with the patient or authorized representative who has indicated his/her understanding and acceptance.     Dental advisory given and Consent reviewed with POA  Plan Discussed with: CRNA  Anesthesia  Plan Comments:         Anesthesia Quick Evaluation

## 2023-01-08 NOTE — Progress Notes (Addendum)
STROKE TEAM PROGRESS NOTE   BRIEF HPI [name] is a 89 y.o. female with history of  GERD, prediabetes, permanent afibb on eliquis, HTN, HLD, OSA, also has a history of atrial fibrillation on Eliquis and appears to have been compliant with her medications.  She presented today after her brother found her unresponsive and thought that she was not breathing.  However, upon arrival of EMS, she was moving her right side and attempting to talk, although she was confused.  She was noted to have left-sided weakness, and code stroke was activated.   CT shows no acute abnormalities, acute clot at the right ICA Bifurcation and acute obstructing right MCA M2 clot was seen on CT angiogram with large penumbra on CT perfusion.  NIH on Admission 19  SIGNIFICANT HOSPITAL EVENTS 11/28.  Admitted.  CT shows no acute abnormalities, acute clot at the right ICA Bifurcation and acute obstructing right MCA M2 clot was seen on CTA with large penumbra on CT perfusion.Underwent medical thrombectomy of right MCA occlusion 12/1: transferred out of ICU 12/6: Insurance declined LTAC  INTERIM HISTORY/SUBJECTIVE  Granddaughter at bedside Neurology exam stable.  Vital signs stable. No acute events overnight.  Tentative plan for PEG tube insertion tomorrow, conference call planned with all children and Dr. Bedelia Person at 8 AM to confirm consent.    N.p.o. after midnight, hold heparin at 0600 tomorrow.   OBJECTIVE  CBC    Component Value Date/Time   WBC 11.5 (H) 01/08/2023 0639   RBC 4.29 01/08/2023 0639   HGB 11.6 (L) 01/08/2023 0639   HGB 12.5 09/19/2022 0916   HCT 36.0 01/08/2023 0639   HCT 39.6 09/19/2022 0916   PLT 187 01/08/2023 0639   PLT 169 09/19/2022 0916   MCV 83.9 01/08/2023 0639   MCV 87 09/19/2022 0916   MCV 82 04/13/2011 1125   MCH 27.0 01/08/2023 0639   MCHC 32.2 01/08/2023 0639   RDW 14.0 01/08/2023 0639   RDW 12.9 09/19/2022 0916   RDW 14.4 04/13/2011 1125   LYMPHSABS 1.1 12/29/2022  1706   LYMPHSABS 3.7 (H) 07/08/2015 1136   MONOABS 0.2 12/29/2022 1706   EOSABS 0.0 12/29/2022 1706   EOSABS 0.2 07/08/2015 1136   BASOSABS 0.0 12/29/2022 1706   BASOSABS 0.1 07/08/2015 1136    BMET    Component Value Date/Time   NA 139 01/08/2023 0639   NA 143 12/07/2022 1535   NA 141 04/13/2011 1125   K 5.1 01/08/2023 0639   K 3.4 (L) 04/13/2011 1125   CL 108 01/08/2023 0639   CL 105 04/13/2011 1125   CO2 22 01/08/2023 0639   CO2 29 04/13/2011 1125   GLUCOSE 162 (H) 01/08/2023 0639   GLUCOSE 111 (H) 04/13/2011 1125   BUN 29 (H) 01/08/2023 0639   BUN 16 12/07/2022 1535   BUN 14 04/13/2011 1125   CREATININE 0.78 01/08/2023 0639   CREATININE 0.80 08/01/2018 1544   CALCIUM 9.0 01/08/2023 0639   CALCIUM 9.2 04/13/2011 1125   EGFR 74 12/07/2022 1535   GFRNONAA >60 01/08/2023 0639   GFRNONAA 67 08/01/2018 1544    IMAGING past 24 hours No results found.  Vitals:   01/08/23 0451 01/08/23 0717 01/08/23 0741 01/08/23 1129  BP: 132/74  (!) 153/75 (!) 155/82  Pulse: 96  97 93  Resp: 18  17 17   Temp: 98.1 F (36.7 C)  98.7 F (37.1 C) 99.2 F (37.3 C)  TempSrc: Oral  Oral Oral  SpO2: 97%  100%  100%  Weight:  62.5 kg    Height:         PHYSICAL EXAM General: Critically ill elderly lady, NAD CV: Irregularly irregular on monitor Respiratory:  Regular, unlabored respirations on room air GI: Abdomen soft and nontender  NEURO:  Mental Status: Awake and alert. She follows simple commands, and speech is mumbled and quiet, but able to make small conversation.   Cranial Nerves:  II: PERRL.  No blink to threat on left III, IV, VI: EOMI: continued slight rightward gaze. Eyelids elevate symmetrically.  V: Sensation is intact to light touch and symmetrical to face.  VII: left facial droop VIII: hearing intact to voice. IX, X: Able to assess XI: Able to assess XII: tongue is midline without fasciculations. Motor:    Dense left hemiplegia with flaccid left arm.  Does  wiggle toes Spontaneous movement against resistance 4/5 of right arm, against gravity 4/5 movement of right leg Tone: is normal and bulk is normal Sensation- continued decreased sensation on left  Coordination: Unable to assess Gait- deferred  Most Recent NIH: 18  ASSESSMENT/PLAN  Acute Ischemic Infarct:  right MCA territory infarct with R M2 and A1 occlusion s/p IR with TICI 3 revascularization, etiology: Cardioembolic in the setting of A-fib Code Stroke  CT head  -hyperdense right MCA, cytotoxic edema in right stratum and insula.  ASPECTS 7   CTA head & neck acute clot at the right terminal ICA and acute obstructing right MCA M2 and ACA A1. CT perfusion 6/149 in the right MCA territory.  S/p IR via right ICA direct access with TICI3 MRI scattered acute right MCA distribution infarcts involving the right caudate and lentiform nuclei, right insula, and overlying right cerebral hemisphere, small volume petechial hemorrhage at the posterior right frontal region MRA  with interval revascularization of previously seen clot at the right ICA terminus and right M2 segment, Focal moderate distal right P2 stenosis. 2D Echo EF 60 to 65%.  LV severe concentric hypertrophy.  LA severely dilated LDL 33 HgbA1c 6.4 VTE prophylaxis - Heparin IV Eliquis prior to admission, continue heparin IV. Stop heparin IV at 0600 tomorrow AM for planned PEG procedure.  Therapy recommendations: SNF Disposition: Pending  Atrial fibrillation Home Meds: Eliquis On tele Rate controlled continue heparin IV.  ? Cerebral aneurysm MRA head 3-4 mm outpouching arising from the supraclinoid left ICA, suspicious for a left PCOM aneurysm CTA head did not report aneurysm Yearly MRA follow up as outpt  Hypertensive Emergency Home meds: Imdur 120 mg, olmesartan-amlodipine-HCTZ 40-10-12.5 mg, spironolactone 25 mg On Avapro 150 mg daily, spironolactone 12.5 Add amlodipine 10 and hydrochlorothiazide 12.5 BP goal: SBP less  than 180 Long-term BP goal normotensive  Hyperlipidemia Home meds: Lipitor 40 mg, resumed in hospital LDL 33, goal < 70 Continue statin at discharge  Acute respiratory failure post procedure Hypoxia  Concern for Aspiration Pneumonia  Extubated on 11/29 CXR 11/30: left lung opacification, likely mucous plug. 40mg  Lasix x 1 on 11/30  Completed 7-day course of ceftriaxone  Dysphagia Patient has post-stroke dysphagia, SLP consulted Continue NPO Core track tube in place  Continue tube feedings @ 55 Tentative plan for PEG Monday, awaiting conference call 0800 tomorrow AM with patients children and Dr. Bedelia Person.   Other Stroke Risk Factors  Obstructive sleep apnea,  on CPAP at home  Advanced age  Other acute issues Leukocytosis WBC 9.3-- 11.1   Hospital day # 10   Pt seen by Neuro NP/APP and later by MD. Note/plan to  be edited by MD as needed.    Lynnae January, DNP, AGACNP-BC [facility] Please use AMION for contact information & EPIC for messaging.  ATTENDING NOTE: I reviewed above note and agree with the assessment and plan. Pt was seen and examined.   No acute event overnight.  Patient neuro stable, continue heparin IV in preparation for PEG tube tomorrow.  Trauma team on board, will get consent with daughter in a.m. before procedure.  Continue statin.  For detailed assessment and plan, please refer to above/below as I have made changes wherever appropriate.   Marvel Plan, MD PhD Stroke Neurology 01/08/2023 7:09 PM

## 2023-01-09 ENCOUNTER — Encounter (HOSPITAL_COMMUNITY)
Admission: EM | Disposition: A | Discharge status: Skilled Nursing Facility | Payer: Self-pay | Source: Home / Self Care | Attending: Neurology

## 2023-01-09 ENCOUNTER — Inpatient Hospital Stay (HOSPITAL_COMMUNITY): Payer: 59 | Admitting: Anesthesiology

## 2023-01-09 ENCOUNTER — Encounter (HOSPITAL_COMMUNITY): Payer: Self-pay | Admitting: Neurology

## 2023-01-09 ENCOUNTER — Inpatient Hospital Stay (HOSPITAL_COMMUNITY): Payer: 59

## 2023-01-09 DIAGNOSIS — I2510 Atherosclerotic heart disease of native coronary artery without angina pectoris: Secondary | ICD-10-CM

## 2023-01-09 DIAGNOSIS — K219 Gastro-esophageal reflux disease without esophagitis: Secondary | ICD-10-CM

## 2023-01-09 HISTORY — PX: PEG PLACEMENT: SHX5437

## 2023-01-09 HISTORY — PX: ESOPHAGOGASTRODUODENOSCOPY: SHX5428

## 2023-01-09 LAB — BASIC METABOLIC PANEL
Anion gap: 11 (ref 5–15)
BUN: 35 mg/dL — ABNORMAL HIGH (ref 8–23)
CO2: 21 mmol/L — ABNORMAL LOW (ref 22–32)
Calcium: 8.4 mg/dL — ABNORMAL LOW (ref 8.9–10.3)
Chloride: 104 mmol/L (ref 98–111)
Creatinine, Ser: 0.79 mg/dL (ref 0.44–1.00)
GFR, Estimated: >60 mL/min (ref >60)
Glucose, Bld: 145 mg/dL — ABNORMAL HIGH (ref 70–99)
Potassium: 4.8 mmol/L (ref 3.5–5.1)
Sodium: 136 mmol/L (ref 135–145)

## 2023-01-09 LAB — CBC
HCT: 32.8 % — ABNORMAL LOW (ref 36.0–46.0)
Hemoglobin: 10.7 g/dL — ABNORMAL LOW (ref 12.0–15.0)
MCH: 27.1 pg (ref 26.0–34.0)
MCHC: 32.6 g/dL (ref 30.0–36.0)
MCV: 83 fL (ref 80.0–100.0)
Platelets: 267 10*3/uL (ref 150–400)
RBC: 3.95 MIL/uL (ref 3.87–5.11)
RDW: 14.1 % (ref 11.5–15.5)
WBC: 19.3 10*3/uL — ABNORMAL HIGH (ref 4.0–10.5)
nRBC: 0 % (ref 0.0–0.2)

## 2023-01-09 LAB — HEPARIN LEVEL (UNFRACTIONATED)
Heparin Unfractionated: 0.23 [IU]/mL — ABNORMAL LOW (ref 0.30–0.70)
Heparin Unfractionated: 0.15 [IU]/mL — ABNORMAL LOW (ref 0.30–0.70)

## 2023-01-09 LAB — GLUCOSE, CAPILLARY
Glucose-Capillary: 119 mg/dL — ABNORMAL HIGH (ref 70–99)
Glucose-Capillary: 180 mg/dL — ABNORMAL HIGH (ref 70–99)
Glucose-Capillary: 194 mg/dL — ABNORMAL HIGH (ref 70–99)

## 2023-01-09 SURGERY — EGD (ESOPHAGOGASTRODUODENOSCOPY)
Anesthesia: General

## 2023-01-09 MED ORDER — LIDOCAINE 2% (20 MG/ML) 5 ML SYRINGE
INTRAMUSCULAR | Status: DC | PRN
  Administered 2023-01-09: 60 mg via INTRAVENOUS

## 2023-01-09 MED ORDER — ROCURONIUM BROMIDE 10 MG/ML (PF) SYRINGE
PREFILLED_SYRINGE | INTRAVENOUS | Status: DC | PRN
  Administered 2023-01-09: 40 mg via INTRAVENOUS

## 2023-01-09 MED ORDER — DEXAMETHASONE SODIUM PHOSPHATE 10 MG/ML IJ SOLN
INTRAMUSCULAR | Status: DC | PRN
  Administered 2023-01-09: 5 mg via INTRAVENOUS

## 2023-01-09 MED ORDER — PHENYLEPHRINE 80 MCG/ML (10ML) SYRINGE FOR IV PUSH (FOR BLOOD PRESSURE SUPPORT)
PREFILLED_SYRINGE | INTRAVENOUS | Status: DC | PRN
  Administered 2023-01-09: 160 ug via INTRAVENOUS
  Administered 2023-01-09: 80 ug via INTRAVENOUS
  Administered 2023-01-09 (×2): 240 ug via INTRAVENOUS

## 2023-01-09 MED ORDER — PROPOFOL 10 MG/ML IV BOLUS
INTRAVENOUS | Status: DC | PRN
  Administered 2023-01-09: 100 mg via INTRAVENOUS

## 2023-01-09 MED ORDER — OSMOLITE 1.2 CAL PO LIQD
1000.0000 mL | ORAL | Status: DC
  Administered 2023-01-09 – 2023-01-11 (×3): 1000 mL
  Filled 2023-01-09: qty 1000

## 2023-01-09 MED ORDER — SUGAMMADEX SODIUM 200 MG/2ML IV SOLN
INTRAVENOUS | Status: DC | PRN
  Administered 2023-01-09: 130 mg via INTRAVENOUS

## 2023-01-09 MED ORDER — SODIUM CHLORIDE 0.9 % IV SOLN: INTRAVENOUS | Status: DC | PRN

## 2023-01-09 MED ORDER — HEPARIN (PORCINE) 25000 UT/250ML-% IV SOLN
1400.0000 [IU]/h | INTRAVENOUS | Status: AC
Start: 2023-01-09 — End: 2023-01-10
  Administered 2023-01-10: 1250 [IU]/h via INTRAVENOUS
  Filled 2023-01-09: qty 250

## 2023-01-09 MED ORDER — ONDANSETRON HCL 4 MG/2ML IJ SOLN
INTRAMUSCULAR | Status: DC | PRN
  Administered 2023-01-09: 4 mg via INTRAVENOUS

## 2023-01-09 NOTE — TOC Progression Note (Signed)
Transition of Care [facility]) - Progression Note    Patient Details  Name: Jocelyn [name] MRN: [id number] Date of Birth: [birth year]  Transition of Care [facility]) CM/SW Contact  Kermit Balo, RN Phone Number: 01/09/2023, 3:32 PM  Clinical Narrative:     CM has sent referral for SNF to [facility] and [facility] per daughter request. They both have denied to offer a bed for SNF rehab.  [facility] also has denied.  CM is awaiting to hear back from [facility] in [place]. CM has left them 2 voicemails.  TOC following.  Expected Discharge Plan: Skilled Nursing Facility Barriers to Discharge: Continued Medical Work up  Expected Discharge Plan and Services   Discharge Planning Services: CM Consult Post Acute Care Choice: Long Term Acute Care (LTAC) Living arrangements for the past 2 months: Single Family Home                                       Social Determinants of Health (SDOH) Interventions SDOH Screenings   Food Insecurity: Patient Unable To Answer (12/30/2022)  Housing: Patient Unable To Answer (12/30/2022)  Transportation Needs: No Transportation Needs (12/30/2022)  Utilities: Not At Risk (12/30/2022)  Alcohol Screen: Low Risk  (12/03/2018)  Depression (PHQ2-9): Low Risk  (12/06/2022)  Financial Resource Strain: Medium Risk (07/26/2018)  Physical Activity: Inactive (07/26/2018)  Social Connections: Moderately Isolated (07/26/2018)  Stress: No Stress Concern Present (07/26/2018)  Tobacco Use: Low Risk  (01/09/2023)    Readmission Risk Interventions     No data to display

## 2023-01-09 NOTE — Anesthesia Postprocedure Evaluation (Signed)
Anesthesia Post Note  Patient: Jocelyn [name]  Procedure(s) Performed: ESOPHAGOGASTRODUODENOSCOPY (EGD) PERCUTANEOUS ENDOSCOPIC GASTROSTOMY (PEG) PLACEMENT     Patient location during evaluation: PACU Anesthesia Type: General Level of consciousness: awake and alert Pain management: pain level controlled Vital Signs Assessment: post-procedure vital signs reviewed and stable Respiratory status: spontaneous breathing, nonlabored ventilation and respiratory function stable Cardiovascular status: blood pressure returned to baseline Postop Assessment: no apparent nausea or vomiting Anesthetic complications: no   No notable events documented.               Shanda Howells

## 2023-01-09 NOTE — Plan of Care (Signed)
  Problem: Education: Goal: Knowledge of disease or condition will improve Outcome: Progressing Goal: Knowledge of secondary prevention will improve (MUST DOCUMENT ALL) Outcome: Progressing Goal: Knowledge of patient specific risk factors will improve Loraine Leriche N/A or DELETE if not current risk factor) Outcome: Progressing   Problem: Ischemic Stroke/TIA Tissue Perfusion: Goal: Complications of ischemic stroke/TIA will be minimized Outcome: Progressing   Problem: Coping: Goal: Will verbalize positive feelings about self Outcome: Progressing Goal: Will identify appropriate support needs Outcome: Progressing   Problem: Health Behavior/Discharge Planning: Goal: Ability to manage health-related needs will improve Outcome: Progressing Goal: Goals will be collaboratively established with patient/family Outcome: Progressing   Problem: Self-Care: Goal: Ability to participate in self-care as condition permits will improve Outcome: Progressing Goal: Verbalization of feelings and concerns over difficulty with self-care will improve Outcome: Progressing Goal: Ability to communicate needs accurately will improve Outcome: Progressing

## 2023-01-09 NOTE — Plan of Care (Signed)
  Problem: Education: Goal: Knowledge of disease or condition will improve Outcome: Progressing Goal: Knowledge of secondary prevention will improve (MUST DOCUMENT ALL) Outcome: Progressing Goal: Knowledge of patient specific risk factors will improve Loraine Leriche N/A or DELETE if not current risk factor) Outcome: Progressing   Problem: Ischemic Stroke/TIA Tissue Perfusion: Goal: Complications of ischemic stroke/TIA will be minimized Outcome: Progressing   Problem: Coping: Goal: Will verbalize positive feelings about self Outcome: Progressing Goal: Will identify appropriate support needs Outcome: Progressing   Problem: Health Behavior/Discharge Planning: Goal: Ability to manage health-related needs will improve Outcome: Progressing Goal: Goals will be collaboratively established with patient/family Outcome: Progressing   Problem: Self-Care: Goal: Ability to participate in self-care as condition permits will improve Outcome: Progressing Goal: Verbalization of feelings and concerns over difficulty with self-care will improve Outcome: Progressing Goal: Ability to communicate needs accurately will improve Outcome: Progressing   Problem: Nutrition: Goal: Risk of aspiration will decrease Outcome: Progressing Goal: Dietary intake will improve Outcome: Progressing   Problem: Education: Goal: Understanding of CV disease, CV risk reduction, and recovery process will improve Outcome: Progressing Goal: Individualized Educational Video(s) Outcome: Progressing   Problem: Activity: Goal: Ability to return to baseline activity level will improve Outcome: Progressing

## 2023-01-09 NOTE — Op Note (Signed)
   Procedure Note  Date: 01/09/2023  Procedure: esophagogastroduodenoscopy (EGD) and percutaneous endoscopic gastrostomy (PEG) tube placement  Pre-op diagnosis: dysphagia, malnutrition Post-op diagnosis: same  Indication and clinical history: 38F with dysphagia  Surgeon: Diamantina Monks, MD Assistant: Earl Gala, [place]  Anesthesia: general  Findings:  Specimen: none EBL: <5cc Drains/Implants: PEG tube, 3cm at the skin   Disposition: PACU  Description of Procedure: The patient was positioned supine. Time-out was performed verifying correct patient, procedure, signature of informed consent, and pre-operative antibiotics as indicated. MAC induction was uneventful and a bite block was placed into the oropharynx. The endoscope was inserted into the oropharynx and advanced down the esophagus into the stomach and into the duodenum. The visualized esophagus and duodenum were unremarkable. The endoscope was retracted back into the stomach and the stomach was insufflated. The stomach was inspected and was also normal. Transillumination was performed. The light was visible on the external skin and dimpling of the stomach was noted endoscopically with manual pressure. The abdomen was prepped and draped in the usual sterile fashion. Transillumination and dimpling were repeated and local anesthetic was infiltrated to make a skin wheal at the site of transillumination. The needle was inserted perpendicularly to the skin and the tip of the needle was visualized endoscopically. As the needle was retracted, the tract was also anesthetized. A skin nick was made at the site of the wheal and an introducer needle and sheath were inserted. The needle was removed and guidewire inserted. The guidewire was grasped by an endoscopic snare and the snare, guidewire, and endoscope retracted out of the oropharynx. The PEG tube was secured to the guidewire and retracted through the mouth and esophagus into the stomach. The PEG  tube was secured with a bolster and was visualized endoscopically to spin freely circumferentially and also be without gaps between the internal bumper and the stomach wall. There was no evidence of bleeding. The PEG bolster was secured at 3cm at the skin and there were no gaps between the bolster and the abdominal wall. The stomach was desufflated endoscopically and the endoscope removed. The bite block was also removed. The patient tolerated the procedure well and there were no complications.   The patient may have water and medications administered via the PEG tube beginning immediately and tube feeds may be initiated four hours post-procedure.    Diamantina Monks, MD General and Trauma Surgery [facility]

## 2023-01-09 NOTE — Plan of Care (Signed)
  Problem: Health Behavior/Discharge Planning: Goal: Goals will be collaboratively established with patient/family 01/09/2023 1700 by Wylie Hail, RN Outcome: Progressing 01/09/2023 1659 by Wylie Hail, RN Outcome: Not Progressing   Problem: Self-Care: Goal: Ability to communicate needs accurately will improve 01/09/2023 1700 by Wylie Hail, RN Outcome: Progressing 01/09/2023 1659 by Wylie Hail, RN Outcome: Progressing   Problem: Nutrition: Goal: Risk of aspiration will decrease 01/09/2023 1700 by Wylie Hail, RN Outcome: Progressing 01/09/2023 1659 by Wylie Hail, RN Outcome: Progressing   Problem: Cardiovascular: Goal: Ability to achieve and maintain adequate cardiovascular perfusion will improve 01/09/2023 1700 by Wylie Hail, RN Outcome: Progressing 01/09/2023 1659 by Wylie Hail, RN Outcome: Not Progressing Goal: Vascular access site(s) Level 0-1 will be maintained 01/09/2023 1700 by Wylie Hail, RN Outcome: Progressing 01/09/2023 1659 by Wylie Hail, RN Outcome: Not Progressing   Problem: Respiratory: Goal: Ability to maintain a clear airway and adequate ventilation will improve 01/09/2023 1700 by Wylie Hail, RN Outcome: Progressing 01/09/2023 1659 by Wylie Hail, RN Outcome: Progressing   Problem: Role Relationship: Goal: Method of communication will improve 01/09/2023 1700 by Wylie Hail, RN Outcome: Progressing 01/09/2023 1659 by Wylie Hail, RN Outcome: Progressing   Problem: Clinical Measurements: Goal: Ability to maintain clinical measurements within normal limits will improve 01/09/2023 1700 by Wylie Hail, RN Outcome: Progressing 01/09/2023 1659 by Wylie Hail, RN Outcome: Not Progressing Goal: Will remain free from infection 01/09/2023 1700 by Wylie Hail, RN Outcome: Progressing 01/09/2023 1659 by Wylie Hail, RN Outcome: Not Progressing Goal: Respiratory complications will improve 01/09/2023 1700 by  Wylie Hail, RN Outcome: Progressing 01/09/2023 1659 by Wylie Hail, RN Outcome: Not Progressing Goal: Cardiovascular complication will be avoided 01/09/2023 1700 by Wylie Hail, RN Outcome: Progressing 01/09/2023 1659 by Wylie Hail, RN Outcome: Not Progressing   Problem: Coping: Goal: Level of anxiety will decrease 01/09/2023 1700 by Wylie Hail, RN Outcome: Progressing 01/09/2023 1659 by Wylie Hail, RN Outcome: Not Progressing   Problem: Elimination: Goal: Will not experience complications related to bowel motility 01/09/2023 1700 by Wylie Hail, RN Outcome: Progressing 01/09/2023 1659 by Wylie Hail, RN Outcome: Not Progressing Goal: Will not experience complications related to urinary retention 01/09/2023 1700 by Wylie Hail, RN Outcome: Progressing 01/09/2023 1659 by Wylie Hail, RN Outcome: Not Progressing   Problem: Pain Management: Goal: General experience of comfort will improve 01/09/2023 1700 by Wylie Hail, RN Outcome: Progressing 01/09/2023 1659 by Wylie Hail, RN Outcome: Not Progressing   Problem: Safety: Goal: Ability to remain free from injury will improve 01/09/2023 1700 by Wylie Hail, RN Outcome: Progressing 01/09/2023 1659 by Wylie Hail, RN Outcome: Progressing   Problem: Skin Integrity: Goal: Risk for impaired skin integrity will decrease 01/09/2023 1700 by Wylie Hail, RN Outcome: Progressing 01/09/2023 1659 by Wylie Hail, RN Outcome: Progressing

## 2023-01-09 NOTE — Anesthesia Procedure Notes (Signed)
Procedure Name: Intubation Date/Time: 01/09/2023 10:06 AM  Performed by: Ammie Dalton, CRNAPre-anesthesia Checklist: Patient identified, Emergency Drugs available, Suction available and Patient being monitored Patient Re-evaluated:Patient Re-evaluated prior to induction Oxygen Delivery Method: Circle System Utilized Preoxygenation: Pre-oxygenation with 100% oxygen Induction Type: IV induction Ventilation: Mask ventilation without difficulty Laryngoscope Size: Mac and 3 Grade View: Grade I Tube type: Oral Tube size: 7.0 mm Number of attempts: 1 Airway Equipment and Method: Stylet Placement Confirmation: ETT inserted through vocal cords under direct vision, positive ETCO2 and breath sounds checked- equal and bilateral Secured at: 22 cm Tube secured with: Tape Dental Injury: Teeth and Oropharynx as per pre-operative assessment

## 2023-01-09 NOTE — Transfer of Care (Signed)
Immediate Anesthesia Transfer of Care Note  Patient: Jocelyn [name]  Procedure(s) Performed: ESOPHAGOGASTRODUODENOSCOPY (EGD) PERCUTANEOUS ENDOSCOPIC GASTROSTOMY (PEG) PLACEMENT  Patient Location: PACU and Endoscopy Unit  Anesthesia Type:General  Level of Consciousness: drowsy and patient cooperative  Airway & Oxygen Therapy: Patient Spontanous Breathing  Post-op Assessment: Report given to RN and Post -op Vital signs reviewed and stable  Post vital signs: Reviewed and stable  Last Vitals:  Vitals Value Taken Time  BP 91/73 01/09/23 1033  Temp    Pulse 91 01/09/23 1036  Resp 42 01/09/23 1036  SpO2 94 % 01/09/23 1036  Vitals shown include unfiled device data.  Last Pain:  Vitals:   01/09/23 0934  TempSrc:   PainSc: 0-No pain         Complications: No notable events documented.

## 2023-01-09 NOTE — Progress Notes (Addendum)
STROKE TEAM PROGRESS NOTE   BRIEF HPI [name] is a 89 y.o. female with history of  GERD, prediabetes, permanent afibb on eliquis, HTN, HLD, OSA, also has a history of atrial fibrillation on Eliquis and appears to have been compliant with her medications.  She presented today after her brother found her unresponsive and thought that she was not breathing.  However, upon arrival of EMS, she was moving her right side and attempting to talk, although she was confused.  She was noted to have left-sided weakness, and code stroke was activated.   CT shows no acute abnormalities, acute clot at the right ICA Bifurcation and acute obstructing right MCA M2 clot was seen on CT angiogram with large penumbra on CT perfusion.  NIH on Admission 19  SIGNIFICANT HOSPITAL EVENTS 11/28.  Admitted.  CT shows no acute abnormalities, acute clot at the right ICA Bifurcation and acute obstructing right MCA M2 clot was seen on CTA with large penumbra on CT perfusion.Underwent medical thrombectomy of right MCA occlusion 12/1: transferred out of ICU 12/6: Insurance declined LTAC  INTERIM HISTORY/SUBJECTIVE No family at the bedside. Neurology exam stable.  Vital signs stable. No acute events overnight.  PEG tube completed today without complication TF resume at 1530 Resume heparin at 1630 WBC count 19 this morning, CXR and UA pending. CBC tomorrow.  OBJECTIVE  CBC    Component Value Date/Time   WBC 19.3 (H) 01/09/2023 0627   RBC 3.95 01/09/2023 0627   HGB 10.7 (L) 01/09/2023 0627   HGB 12.5 09/19/2022 0916   HCT 32.8 (L) 01/09/2023 0627   HCT 39.6 09/19/2022 0916   PLT 267 01/09/2023 0627   PLT 169 09/19/2022 0916   MCV 83.0 01/09/2023 0627   MCV 87 09/19/2022 0916   MCV 82 04/13/2011 1125   MCH 27.1 01/09/2023 0627   MCHC 32.6 01/09/2023 0627   RDW 14.1 01/09/2023 0627   RDW 12.9 09/19/2022 0916   RDW 14.4 04/13/2011 1125   LYMPHSABS 1.1 12/29/2022 1706   LYMPHSABS 3.7 (H) 07/08/2015  1136   MONOABS 0.2 12/29/2022 1706   EOSABS 0.0 12/29/2022 1706   EOSABS 0.2 07/08/2015 1136   BASOSABS 0.0 12/29/2022 1706   BASOSABS 0.1 07/08/2015 1136    BMET    Component Value Date/Time   NA 136 01/09/2023 0627   NA 143 12/07/2022 1535   NA 141 04/13/2011 1125   K 4.8 01/09/2023 0627   K 3.4 (L) 04/13/2011 1125   CL 104 01/09/2023 0627   CL 105 04/13/2011 1125   CO2 21 (L) 01/09/2023 0627   CO2 29 04/13/2011 1125   GLUCOSE 145 (H) 01/09/2023 0627   GLUCOSE 111 (H) 04/13/2011 1125   BUN 35 (H) 01/09/2023 0627   BUN 16 12/07/2022 1535   BUN 14 04/13/2011 1125   CREATININE 0.79 01/09/2023 0627   CREATININE 0.80 08/01/2018 1544   CALCIUM 8.4 (L) 01/09/2023 0627   CALCIUM 9.2 04/13/2011 1125   EGFR 74 12/07/2022 1535   GFRNONAA >60 01/09/2023 0627   GFRNONAA 67 08/01/2018 1544    IMAGING past 24 hours No results found.  Vitals:   01/09/23 0500 01/09/23 0848 01/09/23 0922 01/09/23 0934  BP:   (!) 129/53   Pulse:  (!) 106 98   Resp:  16 (!) 26   Temp:  98.7 F (37.1 C) (!) 97.2 F (36.2 C)   TempSrc:  Oral Temporal   SpO2:  97% 93%   Weight: 62.5 kg  62.5 kg  Height:    5\' 3"  (1.6 m)     PHYSICAL EXAM General: Critically ill elderly lady, NAD CV: Irregularly irregular on monitor Respiratory:  Regular, unlabored respirations on room air GI: Abdomen soft and nontender  NEURO:  Mental Status: Awake and alert. She follows simple commands, and speech is mumbled and quiet, but able to make small conversation.   Cranial Nerves:  II: PERRL.  No blink to threat on left III, IV, VI: EOMI: continued slight rightward gaze. Eyelids elevate symmetrically.  V: Sensation is intact to light touch and symmetrical to face.  VII: left facial droop VIII: hearing intact to voice. IX, X: Able to assess XI: Able to assess XII: tongue is midline without fasciculations. Motor:    Dense left hemiplegia with flaccid left arm.  Does wiggle toes Spontaneous movement against  resistance 4/5 of right arm, against gravity 4/5 movement of right leg Tone: is normal and bulk is normal Sensation- continued decreased sensation on left  Coordination: Unable to assess Gait- deferred  Most Recent NIH: 18  ASSESSMENT/PLAN  Acute Ischemic Infarct:  right MCA territory infarct with R M2 and A1 occlusion s/p IR with TICI 3 revascularization, etiology: Cardioembolic in the setting of A-fib Code Stroke  CT head  -hyperdense right MCA, cytotoxic edema in right stratum and insula.  ASPECTS 7   CTA head & neck acute clot at the right terminal ICA and acute obstructing right MCA M2 and ACA A1. CT perfusion 6/149 in the right MCA territory.  S/p IR via right ICA direct access with TICI3 MRI scattered acute right MCA distribution infarcts involving the right caudate and lentiform nuclei, right insula, and overlying right cerebral hemisphere, small volume petechial hemorrhage at the posterior right frontal region MRA  with interval revascularization of previously seen clot at the right ICA terminus and right M2 segment, Focal moderate distal right P2 stenosis. 2D Echo EF 60 to 65%.  LV severe concentric hypertrophy.  LA severely dilated LDL 33 HgbA1c 6.4 VTE prophylaxis - Heparin IV Eliquis prior to admission, heparin IV hold for planned PEG procedure today.  Therapy recommendations: SNF Disposition: Pending  Atrial fibrillation Home Meds: Eliquis On tele Rate controlled Will resume heparin IV post PEG and plan for eliquis tomorrow if wound looks good  ? Cerebral aneurysm MRA head 3-4 mm outpouching arising from the supraclinoid left ICA, suspicious for a left PCOM aneurysm CTA head did not report aneurysm Yearly MRA follow up as outpt  Hypertensive Emergency Home meds: Imdur 120 mg, olmesartan-amlodipine-HCTZ 40-10-12.5 mg, spironolactone 25 mg On Avapro 150 mg daily, spironolactone 12.5 Add amlodipine 10 and hydrochlorothiazide 12.5 BP goal: SBP less than  180 Long-term BP goal normotensive  Hyperlipidemia Home meds: Lipitor 40 mg, resumed in hospital LDL 33, goal < 70 Continue statin at discharge  Acute respiratory failure post procedure Hypoxia  Concern for Aspiration Pneumonia  Extubated on 11/29 CXR 11/30: left lung opacification, likely mucous plug. 40mg  Lasix x 1 on 11/30  Completed 7-day course of ceftriaxone  Dysphagia Patient has post-stroke dysphagia, SLP consulted Continue NPO Core track tube in place  Continue tube feedings @ 55 -> resume at 1530 PEG Tube today with trauma team  Other Stroke Risk Factors  Obstructive sleep apnea,  on CPAP at home  Advanced age  Other acute issues Leukocytosis WBC 9.3-- 11.1 -> 19.3 CXR and UA ordered  Hospital day # 11  Patient seen and examined by NP/APP with MD. MD to update note  as needed.   Elmer Picker, DNP, FNP-BC [facility] Pager: [phone]  ATTENDING NOTE: I reviewed above note and agree with the assessment and plan. Pt was seen and examined.   No family at bedside.  Patient neuro stable, pending PEG tube today.  Currently heparin IV on hold, consider resume heparin IV after PEG tube placement and switch to Eliquis tomorrow if wound stable.  Still has leukocytosis, CXR UA ordered.  Continue monitoring.  Afebrile.  For detailed assessment and plan, please refer to above/below as I have made changes wherever appropriate.   Marvel Plan, MD PhD Stroke Neurology 01/09/2023 7:59 PM

## 2023-01-09 NOTE — Progress Notes (Signed)
Trauma/Critical Care Follow Up Note  Subjective:    Overnight Issues:   Objective:  Vital signs for last 24 hours: Temp:  [98.4 F (36.9 C)-99.5 F (37.5 C)] 98.4 F (36.9 C) (12/09 0338) Pulse Rate:  [93-98] 98 (12/09 0338) Resp:  [16-17] 16 (12/09 0338) BP: (129-167)/(43-82) 153/65 (12/09 0338) SpO2:  [97 %-100 %] 97 % (12/09 0338) Weight:  [62.5 kg] 62.5 kg (12/09 0500)  Hemodynamic parameters for last 24 hours:    Intake/Output from previous day: 12/08 0701 - 12/09 0700 In: 1632.1 [I.V.:271.7; NG/GT:1360.3] Out: 325 [Urine:325]  Intake/Output this shift: No intake/output data recorded.  Vent settings for last 24 hours:    Physical Exam:  Gen: comfortable, no distress Neuro: follows commands HEENT: PERRL Neck: supple CV: RRR Pulm: unlabored breathing on RA Abd: soft, NT   ,    GU: urine clear and yellow, +spontaneous voids Extr: wwp, no edema  Results for orders placed or performed during the hospital encounter of 12/29/22 (from the past 24 hour(s))  Glucose, capillary     Status: Abnormal   Collection Time: 01/08/23 11:30 AM  Result Value Ref Range   Glucose-Capillary 126 (H) 70 - 99 mg/dL  Heparin level (unfractionated)     Status: Abnormal   Collection Time: 01/08/23  3:51 PM  Result Value Ref Range   Heparin Unfractionated 0.21 (L) 0.30 - 0.70 IU/mL  Glucose, capillary     Status: Abnormal   Collection Time: 01/08/23  4:21 PM  Result Value Ref Range   Glucose-Capillary 151 (H) 70 - 99 mg/dL  Glucose, capillary     Status: Abnormal   Collection Time: 01/08/23  8:08 PM  Result Value Ref Range   Glucose-Capillary 184 (H) 70 - 99 mg/dL  Glucose, capillary     Status: Abnormal   Collection Time: 01/08/23 11:41 PM  Result Value Ref Range   Glucose-Capillary 141 (H) 70 - 99 mg/dL  Heparin level (unfractionated)     Status: Abnormal   Collection Time: 01/09/23 12:26 AM  Result Value Ref Range   Heparin Unfractionated 0.23 (L) 0.30 - 0.70 IU/mL   Glucose, capillary     Status: Abnormal   Collection Time: 01/09/23  4:17 AM  Result Value Ref Range   Glucose-Capillary 119 (H) 70 - 99 mg/dL  CBC     Status: Abnormal   Collection Time: 01/09/23  6:27 AM  Result Value Ref Range   WBC 19.3 (H) 4.0 - 10.5 K/uL   RBC 3.95 3.87 - 5.11 MIL/uL   Hemoglobin 10.7 (L) 12.0 - 15.0 g/dL   HCT 96.2 (L) 95.2 - 84.1 %   MCV 83.0 80.0 - 100.0 fL   MCH 27.1 26.0 - 34.0 pg   MCHC 32.6 30.0 - 36.0 g/dL   RDW 32.4 40.1 - 02.7 %   Platelets 267 150 - 400 K/uL   nRBC 0.0 0.0 - 0.2 %  Basic metabolic panel     Status: Abnormal   Collection Time: 01/09/23  6:27 AM  Result Value Ref Range   Sodium 136 135 - 145 mmol/L   Potassium 4.8 3.5 - 5.1 mmol/L   Chloride 104 98 - 111 mmol/L   CO2 21 (L) 22 - 32 mmol/L   Glucose, Bld 145 (H) 70 - 99 mg/dL   BUN 35 (H) 8 - 23 mg/dL   Creatinine, Ser 2.53 0.44 - 1.00 mg/dL   Calcium 8.4 (L) 8.9 - 10.3 mg/dL   GFR, Estimated >66 >44 mL/min  Anion gap 11 5 - 15  Heparin level (unfractionated)     Status: Abnormal   Collection Time: 01/09/23  6:27 AM  Result Value Ref Range   Heparin Unfractionated 0.15 (L) 0.30 - 0.70 IU/mL    Assessment & Plan: The plan of care was discussed with the bedside nurse for the day, who is in agreement with this plan and no additional concerns were raised.   Present on Admission:  Acute ischemic stroke (HCC)    LOS: 11 days   Additional comments:I reviewed the patient's new clinical lab test results.   and I reviewed the patients new imaging test results.    Dysphagia s/p CVA I discussed with three of the four children Gilford Raid, Eithel, Mikulich) on the phone including indications and risks. Risk includes but are not limited to anesthesia (MI, CVA, Death, aspiration), bleeding, pain, infection, scarring, injury to surrounding structures, malposition, and PEG tube complications (leakage, skin irritation, dislodgement and possible need for exchanges).  Discussed that PEG tube will need to stay in place for at least 6-8 weeks but can stay in place long term if needed. Discussed early removal of PEG tube can lead to gastric leak with need for repeat operation. Opportunity provided to ask questions, which were answered. Informed consent provided.    FEN - Cortrak, TFs, stopped MN on 12/9 VTE - SCDs, Heparin gtt held at 0600am ID - Rocephin completed for aspiration  Diamantina Monks, MD Trauma & General Surgery Please use [email or web address] to contact on call provider  01/09/2023  *Care during the described time interval was provided by me. I have reviewed this patient's available data, including medical history, events of note, physical examination and test results as part of my evaluation.

## 2023-01-09 NOTE — Progress Notes (Signed)
PHARMACY - ANTICOAGULATION CONSULT NOTE  Pharmacy Consult for heparin  Indication: atrial fibrillation  No Known Allergies  Patient Measurements: Height: 5\' 3"  (160 cm) Weight: 62.5 kg (137 lb 12.6 oz) IBW/kg (Calculated) : 52.4 Heparin Dosing Weight: 65.5  Vital Signs: Temp: 98.9 F (37.2 C) (12/08 2007) Temp Source: Oral (12/08 2007) BP: 129/43 (12/08 2007) Pulse Rate: 96 (12/08 2007)  Labs: Recent Labs    01/06/23 0518 01/06/23 1526 01/07/23 0439 01/07/23 1003 01/08/23 0639 01/08/23 1551 01/09/23 0026  HGB 11.4*  --  11.4*  --  11.6*  --   --   HCT 34.3*  --  35.0*  --  36.0  --   --   PLT 196  --  209  --  187  --   --   HEPARINUNFRC 0.72*   < >  --    < > 0.24* 0.21* 0.23*  CREATININE  --   --  0.69  --  0.78  --   --    < > = values in this interval not displayed.    Estimated Creatinine Clearance: 39.4 mL/min (by C-G formula based on SCr of 0.78 mg/dL).    Assessment: 66 YOF presenting with stroke 11/28.  Patient with a PMH of atrial fibrillation on Eliquis PTA - LD unknown. Pharmacy consulted to dose heparin.   Heparin level 0.23 (subtherapeutic) despite rate increase to 1150 units/hr.  No issues with the infusion (pauses, interruptions or infiltration) or overt s/sx of bleeding reported per RN.  Goal of Therapy:  Heparin level 0.3-0.5 units/mL with recent CVA Monitor platelets by anticoagulation protocol: Yes   Plan:  Increase heparin gtt to 1250 units/hr Check 8 hr heparin level Hold heparin at 0600 tomorrow for PEG tube placement  Arabella Merles, PharmD. Clinical Pharmacist 01/09/2023 1:40 AM

## 2023-01-10 ENCOUNTER — Encounter (HOSPITAL_COMMUNITY): Payer: Self-pay | Admitting: Surgery

## 2023-01-10 LAB — URINALYSIS, ROUTINE W REFLEX MICROSCOPIC
Bacteria, UA: NONE SEEN
Bilirubin Urine: NEGATIVE
Glucose, UA: NEGATIVE mg/dL
Hgb urine dipstick: NEGATIVE
Ketones, ur: NEGATIVE mg/dL
Leukocytes,Ua: NEGATIVE
Nitrite: NEGATIVE
Protein, ur: 30 mg/dL — AB
Specific Gravity, Urine: 1.024 (ref 1.005–1.030)
pH: 5 (ref 5.0–8.0)

## 2023-01-10 LAB — CBC
HCT: 30.9 % — ABNORMAL LOW (ref 36.0–46.0)
Hemoglobin: 10.1 g/dL — ABNORMAL LOW (ref 12.0–15.0)
MCH: 27.1 pg (ref 26.0–34.0)
MCHC: 32.7 g/dL (ref 30.0–36.0)
MCV: 82.8 fL (ref 80.0–100.0)
Platelets: 262 10*3/uL (ref 150–400)
RBC: 3.73 MIL/uL — ABNORMAL LOW (ref 3.87–5.11)
RDW: 14.3 % (ref 11.5–15.5)
WBC: 17.6 10*3/uL — ABNORMAL HIGH (ref 4.0–10.5)
nRBC: 0 % (ref 0.0–0.2)

## 2023-01-10 LAB — BASIC METABOLIC PANEL
Anion gap: 8 (ref 5–15)
BUN: 49 mg/dL — ABNORMAL HIGH (ref 8–23)
CO2: 22 mmol/L (ref 22–32)
Calcium: 8.4 mg/dL — ABNORMAL LOW (ref 8.9–10.3)
Chloride: 108 mmol/L (ref 98–111)
Creatinine, Ser: 0.81 mg/dL (ref 0.44–1.00)
GFR, Estimated: >60 mL/min (ref >60)
Glucose, Bld: 210 mg/dL — ABNORMAL HIGH (ref 70–99)
Potassium: 4.9 mmol/L (ref 3.5–5.1)
Sodium: 138 mmol/L (ref 135–145)

## 2023-01-10 LAB — GLUCOSE, CAPILLARY
Glucose-Capillary: 206 mg/dL — ABNORMAL HIGH (ref 70–99)
Glucose-Capillary: 199 mg/dL — ABNORMAL HIGH (ref 70–99)
Glucose-Capillary: 164 mg/dL — ABNORMAL HIGH (ref 70–99)
Glucose-Capillary: 162 mg/dL — ABNORMAL HIGH (ref 70–99)
Glucose-Capillary: 165 mg/dL — ABNORMAL HIGH (ref 70–99)

## 2023-01-10 LAB — HEPARIN LEVEL (UNFRACTIONATED)
Heparin Unfractionated: 0.12 [IU]/mL — ABNORMAL LOW (ref 0.30–0.70)
Heparin Unfractionated: 0.3 [IU]/mL (ref 0.30–0.70)

## 2023-01-10 MED ORDER — APIXABAN 5 MG PO TABS
5.0000 mg | ORAL_TABLET | Freq: Two times a day (BID) | ORAL | Status: DC
  Administered 2023-01-10 (×2): 5 mg via ORAL
  Filled 2023-01-10 (×2): qty 1

## 2023-01-10 MED ORDER — HYDROCODONE-ACETAMINOPHEN 5-325 MG PO TABS
1.0000 | ORAL_TABLET | Freq: Four times a day (QID) | ORAL | Status: DC | PRN
  Administered 2023-01-10 – 2023-01-11 (×3): 1 via ORAL
  Filled 2023-01-10 (×3): qty 1

## 2023-01-10 MED ORDER — LIDOCAINE 5 % EX PTCH
1.0000 | MEDICATED_PATCH | CUTANEOUS | Status: DC
  Administered 2023-01-10: 1 via TRANSDERMAL
  Filled 2023-01-10: qty 1

## 2023-01-10 NOTE — Plan of Care (Signed)
  Problem: Self-Care: Goal: Ability to communicate needs accurately will improve Outcome: Progressing   Problem: Nutrition: Goal: Dietary intake will improve Outcome: Progressing   Problem: Respiratory: Goal: Ability to maintain a clear airway and adequate ventilation will improve Outcome: Progressing   Problem: Role Relationship: Goal: Method of communication will improve Outcome: Progressing   Problem: Clinical Measurements: Goal: Will remain free from infection Outcome: Progressing Goal: Diagnostic test results will improve Outcome: Progressing Goal: Respiratory complications will improve Outcome: Progressing Goal: Cardiovascular complication will be avoided Outcome: Progressing   Problem: Nutrition: Goal: Adequate nutrition will be maintained Outcome: Progressing   Problem: Coping: Goal: Level of anxiety will decrease Outcome: Progressing   Problem: Elimination: Goal: Will not experience complications related to bowel motility Outcome: Progressing Goal: Will not experience complications related to urinary retention Outcome: Progressing   Problem: Safety: Goal: Ability to remain free from injury will improve Outcome: Progressing   Problem: Skin Integrity: Goal: Risk for impaired skin integrity will decrease Outcome: Progressing

## 2023-01-10 NOTE — Plan of Care (Signed)
  Problem: Education: Goal: Knowledge of disease or condition will improve 01/10/2023 0735 by Carolyne Littles, RN Outcome: Progressing 01/09/2023 2127 by Carolyne Littles, RN Outcome: Progressing Goal: Knowledge of secondary prevention will improve (MUST DOCUMENT ALL) 01/10/2023 0735 by Carolyne Littles, RN Outcome: Progressing 01/09/2023 2127 by Carolyne Littles, RN Outcome: Progressing Goal: Knowledge of patient specific risk factors will improve Loraine Leriche N/A or DELETE if not current risk factor) 01/10/2023 0735 by Carolyne Littles, RN Outcome: Progressing 01/09/2023 2127 by Carolyne Littles, RN Outcome: Progressing   Problem: Ischemic Stroke/TIA Tissue Perfusion: Goal: Complications of ischemic stroke/TIA will be minimized 01/10/2023 0735 by Carolyne Littles, RN Outcome: Progressing 01/09/2023 2127 by Carolyne Littles, RN Outcome: Progressing   Problem: Coping: Goal: Will verbalize positive feelings about self 01/10/2023 0735 by Carolyne Littles, RN Outcome: Progressing 01/09/2023 2127 by Carolyne Littles, RN Outcome: Progressing Goal: Will identify appropriate support needs 01/10/2023 0735 by Carolyne Littles, RN Outcome: Progressing 01/09/2023 2127 by Carolyne Littles, RN Outcome: Progressing   Problem: Health Behavior/Discharge Planning: Goal: Ability to manage health-related needs will improve 01/10/2023 0735 by Carolyne Littles, RN Outcome: Progressing 01/09/2023 2127 by Carolyne Littles, RN Outcome: Progressing Goal: Goals will be collaboratively established with patient/family 01/10/2023 0735 by Carolyne Littles, RN Outcome: Progressing 01/09/2023 2127 by Carolyne Littles, RN Outcome: Progressing   Problem: Self-Care: Goal: Ability to participate in self-care as condition permits will improve 01/10/2023 0735 by Carolyne Littles, RN Outcome: Progressing 01/09/2023 2127 by Carolyne Littles, RN Outcome: Progressing Goal:  Verbalization of feelings and concerns over difficulty with self-care will improve 01/10/2023 0735 by Carolyne Littles, RN Outcome: Progressing 01/09/2023 2127 by Carolyne Littles, RN Outcome: Progressing Goal: Ability to communicate needs accurately will improve 01/10/2023 0735 by Carolyne Littles, RN Outcome: Progressing 01/09/2023 2127 by Carolyne Littles, RN Outcome: Progressing

## 2023-01-10 NOTE — Progress Notes (Signed)
PHARMACY - ANTICOAGULATION Pharmacy Consult for heparin  Indication: atrial fibrillation Brief A/P: Heparin level within goal range Continue Heparin at current rate   No Known Allergies  Patient Measurements: Height: 5\' 3"  (160 cm) Weight: 62.5 kg (137 lb 12.6 oz) IBW/kg (Calculated) : 52.4 Heparin Dosing Weight: 65.5  Vital Signs: Temp: 98.5 F (36.9 C) (12/09 2326) Temp Source: Oral (12/09 2326) BP: 129/67 (12/09 2326) Pulse Rate: 70 (12/09 2326)  Labs: Recent Labs    01/07/23 0439 01/07/23 1003 01/08/23 0639 01/08/23 1551 01/09/23 0026 01/09/23 0627 01/10/23 0009  HGB 11.4*  --  11.6*  --   --  10.7*  --   HCT 35.0*  --  36.0  --   --  32.8*  --   PLT 209  --  187  --   --  267  --   HEPARINUNFRC  --    < > 0.24*   < > 0.23* 0.15* 0.30  CREATININE 0.69  --  0.78  --   --  0.79  --    < > = values in this interval not displayed.    Estimated Creatinine Clearance: 39.4 mL/min (by C-G formula based on SCr of 0.79 mg/dL).  Assessment: 89 y.o. female with h/o Afib, Elquis on hold, for heparin  Goal of Therapy:  Heparin level 0.3-0.5 units/mL with recent CVA Monitor platelets by anticoagulation protocol: Yes   Plan:  No change to heparin  Geannie Risen, PharmD, BCPS  01/10/2023 1:33 AM

## 2023-01-10 NOTE — Progress Notes (Addendum)
PHARMACY - ANTICOAGULATION CONSULT NOTE  Pharmacy Consult for heparin  Indication: atrial fibrillation  No Known Allergies  Patient Measurements: Height: 5\' 3"  (160 cm) Weight: 62.5 kg (137 lb 12.6 oz) IBW/kg (Calculated) : 52.4 Heparin Dosing Weight: 65.5  Vital Signs: Temp: 98.1 F (36.7 C) (12/10 0405) Temp Source: Oral (12/10 0405) BP: 124/64 (12/10 0405) Pulse Rate: 72 (12/10 0405)  Labs: Recent Labs    01/08/23 0639 01/08/23 1551 01/09/23 0627 01/10/23 0009 01/10/23 0456 01/10/23 0458  HGB 11.6*  --  10.7*  --  10.1*  --   HCT 36.0  --  32.8*  --  30.9*  --   PLT 187  --  267  --  262  --   HEPARINUNFRC 0.24*   < > 0.15* 0.30  --  0.12*  CREATININE 0.78  --  0.79  --  0.81  --    < > = values in this interval not displayed.    Estimated Creatinine Clearance: 38.9 mL/min (by C-G formula based on SCr of 0.81 mg/dL).    Assessment: 43 YOF presenting with stroke 11/28.  Patient with a PMH of atrial fibrillation on Eliquis PTA - LD unknown. Pharmacy consulted to dose heparin.   12/10: Heparin level 0.12 (subtherapeutic) despite rate increase to 1250 units/hr.  No issues with the infusion (pauses, interruptions or infiltration) or overt s/sx of bleeding reported per RN. Noted Stroke team note from yesterday regarding possible switch to Eliquis today. CBC stable following PEG placement 12/9.   Goal of Therapy:  Heparin level 0.3-0.5 units/mL with recent CVA Monitor platelets by anticoagulation protocol: Yes   Plan:  Increase heparin gtt to 1400 units/hr Check 8 hr heparin level F/U Eliquis start   ADDENDUM: Pharmacy consulted to transition heparin to apixaban.   - STOP heparin  - START PTA eliquis 5 mg PPO BID (borderline for dose adjustment based on age and weight) - administer the first dose of Eliquis at the same time the heparin infusion is stopped.   Jani Gravel, PharmD Clinical Pharmacist  01/10/2023 7:43 AM

## 2023-01-10 NOTE — Progress Notes (Addendum)
STROKE TEAM PROGRESS NOTE   BRIEF HPI [name] is a 89 y.o. female with history of  GERD, prediabetes, permanent afibb on eliquis, HTN, HLD, OSA, also has a history of atrial fibrillation on Eliquis and appears to have been compliant with her medications.  She presented today after her brother found her unresponsive and thought that she was not breathing.  However, upon arrival of EMS, she was moving her right side and attempting to talk, although she was confused.  She was noted to have left-sided weakness, and code stroke was activated.   CT shows no acute abnormalities, acute clot at the right ICA Bifurcation and acute obstructing right MCA M2 clot was seen on CT angiogram with large penumbra on CT perfusion.  NIH on Admission 19  SIGNIFICANT HOSPITAL EVENTS 11/28.  Admitted.  CT shows no acute abnormalities, acute clot at the right ICA Bifurcation and acute obstructing right MCA M2 clot was seen on CTA with large penumbra on CT perfusion.Underwent medical thrombectomy of right MCA occlusion 12/1: transferred out of ICU 12/6: Insurance declined LTAC  INTERIM HISTORY/SUBJECTIVE No family at the bedside. Neurology exam stable.  Vital signs stable. No acute events overnight.  PEG tube completed yesterday without complication. Switch to eliquis today.   OBJECTIVE  CBC    Component Value Date/Time   WBC 17.6 (H) 01/10/2023 0456   RBC 3.73 (L) 01/10/2023 0456   HGB 10.1 (L) 01/10/2023 0456   HGB 12.5 09/19/2022 0916   HCT 30.9 (L) 01/10/2023 0456   HCT 39.6 09/19/2022 0916   PLT 262 01/10/2023 0456   PLT 169 09/19/2022 0916   MCV 82.8 01/10/2023 0456   MCV 87 09/19/2022 0916   MCV 82 04/13/2011 1125   MCH 27.1 01/10/2023 0456   MCHC 32.7 01/10/2023 0456   RDW 14.3 01/10/2023 0456   RDW 12.9 09/19/2022 0916   RDW 14.4 04/13/2011 1125   LYMPHSABS 1.1 12/29/2022 1706   LYMPHSABS 3.7 (H) 07/08/2015 1136   MONOABS 0.2 12/29/2022 1706   EOSABS 0.0 12/29/2022 1706    EOSABS 0.2 07/08/2015 1136   BASOSABS 0.0 12/29/2022 1706   BASOSABS 0.1 07/08/2015 1136    BMET    Component Value Date/Time   NA 138 01/10/2023 0456   NA 143 12/07/2022 1535   NA 141 04/13/2011 1125   K 4.9 01/10/2023 0456   K 3.4 (L) 04/13/2011 1125   CL 108 01/10/2023 0456   CL 105 04/13/2011 1125   CO2 22 01/10/2023 0456   CO2 29 04/13/2011 1125   GLUCOSE 210 (H) 01/10/2023 0456   GLUCOSE 111 (H) 04/13/2011 1125   BUN 49 (H) 01/10/2023 0456   BUN 16 12/07/2022 1535   BUN 14 04/13/2011 1125   CREATININE 0.81 01/10/2023 0456   CREATININE 0.80 08/01/2018 1544   CALCIUM 8.4 (L) 01/10/2023 0456   CALCIUM 9.2 04/13/2011 1125   EGFR 74 12/07/2022 1535   GFRNONAA >60 01/10/2023 0456   GFRNONAA 67 08/01/2018 1544    IMAGING past 24 hours DG CHEST PORT 1 VIEW  Result Date: 01/09/2023 CLINICAL DATA:  Respiratory abnormality. EXAM: PORTABLE CHEST 1 VIEW COMPARISON:  Chest radiograph dated 12/31/2022. FINDINGS: Significant improvement in the previously seen left lung opacities. Small left lung base density and possible trace pleural effusion. Faint areas of increased density in the right upper lobe also concerning for infiltrate. No pneumothorax. Mild cardiomegaly. No acute osseous pathology. IMPRESSION: 1. Significant improvement in the previously seen left lung opacities. 2. Small  left lung base density and possible trace pleural effusion. 3. Faint areas of increased density in the right upper lobe also concerning for infiltrate. Electronically Signed   By: Elgie Collard M.D.   On: 01/09/2023 19:34    Vitals:   01/09/23 2326 01/10/23 0405 01/10/23 0754 01/10/23 1133  BP: 129/67 124/64 127/63 (!) 113/55  Pulse: 70 72 81 91  Resp: 16 18 17 19   Temp: 98.5 F (36.9 C) 98.1 F (36.7 C) 98.7 F (37.1 C) 98.1 F (36.7 C)  TempSrc: Oral Oral Oral Oral  SpO2: 100% 99% 98% 95%  Weight:      Height:         PHYSICAL EXAM General: Critically ill elderly lady, NAD CV:  Irregularly irregular on monitor Respiratory:  Regular, unlabored respirations on room air GI: Abdomen soft and nontender  NEURO:  Mental Status: Awake and alert. She follows simple commands, and speech is mumbled and quiet, but able to make small conversation.   Cranial Nerves:  II: PERRL. Visual field full III, IV, VI: EOMI: continued slight right gaze preference. Eyelids elevate symmetrically.  V: Sensation is intact to light touch and symmetrical to face.  VII: left facial droop VIII: hearing intact to voice. IX, X: Able to assess XI: Able to assess XII: tongue is midline without fasciculations. Motor:    Left UE 3/5 with hand grip 2/5. Left LE increased muscle tone with flexion and spastic, pain on extension Spontaneous movement against resistance 4/5 of right arm, against gravity 4/5 movement of right leg Tone: is normal and bulk is normal Sensation- continued decreased sensation on left  Coordination: Unable to assess Gait- deferred  Most Recent NIH: 18  ASSESSMENT/PLAN  Acute Ischemic Infarct:  right MCA territory infarct with R M2 and A1 occlusion s/p IR with TICI 3 revascularization, etiology: Cardioembolic in the setting of A-fib Code Stroke  CT head  -hyperdense right MCA, cytotoxic edema in right stratum and insula.  ASPECTS 7   CTA head & neck acute clot at the right terminal ICA and acute obstructing right MCA M2 and ACA A1. CT perfusion 6/149 in the right MCA territory.  S/p IR via right ICA direct access with TICI3 MRI scattered acute right MCA distribution infarcts involving the right caudate and lentiform nuclei, right insula, and overlying right cerebral hemisphere, small volume petechial hemorrhage at the posterior right frontal region MRA  with interval revascularization of previously seen clot at the right ICA terminus and right M2 segment, Focal moderate distal right P2 stenosis. 2D Echo EF 60 to 65%.  LV severe concentric hypertrophy.  LA severely  dilated LDL 33 HgbA1c 6.4 VTE prophylaxis - Heparin IV Eliquis prior to admission, eliquis started 12/10 Therapy recommendations: SNF Disposition: Pending  Atrial fibrillation Home Meds: Eliquis On tele Rate controlled Resumed Eliquis  ? Cerebral aneurysm MRA head 3-4 mm outpouching arising from the supraclinoid left ICA, suspicious for a left PCOM aneurysm CTA head did not report aneurysm Yearly MRA follow up as outpt  Hypertensive Emergency Home meds: Imdur 120 mg, olmesartan-amlodipine-HCTZ 40-10-12.5 mg, spironolactone 25 mg On Avapro 150 mg daily, spironolactone 12.5 Add amlodipine 10 and hydrochlorothiazide 12.5 BP goal: SBP less than 180 Long-term BP goal normotensive  Hyperlipidemia Home meds: Lipitor 40 mg, resumed in hospital LDL 33, goal < 70 Continue statin at discharge  Acute respiratory failure post procedure Hypoxia  Concern for Aspiration Pneumonia  Extubated on 11/29 CXR 11/30: left lung opacification, likely mucous plug. 40mg  Lasix x 1  on 11/30  Completed 7-day course of ceftriaxone  Dysphagia Patient has post-stroke dysphagia, SLP consulted Continue NPO PEG Tube 12/9 Continue tube feedings @ 55  Other Stroke Risk Factors  Obstructive sleep apnea,  on CPAP at home  Advanced age  Other acute issues Left LE spasm - PT/OT Leukocytosis WBC 9.3-- 11.1 -> 19.3 -> 17.6 CXR  Significant improvement in the previously seen left lung opacities. Small left lung base density and possible trace pleural effusion. Faint areas of increased density in the right upper lobe also concerning for infiltrate.  Hospital day # 12  Patient seen and examined by NP/APP with MD. MD to update note as needed.   Elmer Picker, DNP, FNP-BC [facility] Pager: [phone]  ATTENDING NOTE: I reviewed above note and agree with the assessment and plan. Pt was seen and examined.   No family at bedside.  Patient lying bed, PEG tube placed yesterday and no  issues.  Changed heparin IV to Eliquis today.  Her left upper and lower extremity strength has improved but still weak but with left LE spasm.  Pending SNF.  For detailed assessment and plan, please refer to above/below as I have made changes wherever appropriate.   Marvel Plan, MD PhD Stroke Neurology 01/10/2023 4:55 PM

## 2023-01-10 NOTE — Progress Notes (Signed)
1 Day Post-Op  Subjective: CC: Tolerating tf's without reported abdominal pain, n/v. BM yesterday.  On heparin. Hgb stable at 10.1 from 10.7.   Objective: Vital signs in last 24 hours: Temp:  [97.4 F (36.3 C)-99 F (37.2 C)] 98.7 F (37.1 C) (12/10 0754) Pulse Rate:  [70-109] 81 (12/10 0754) Resp:  [16-33] 17 (12/10 0754) BP: (91-135)/(46-73) 127/63 (12/10 0754) SpO2:  [91 %-100 %] 98 % (12/10 0754) Last BM Date : 01/09/23  Intake/Output from previous day: 12/09 0701 - 12/10 0700 In: 1345.3 [I.V.:525.6; NG/GT:819.8] Out: 400 [Urine:400] Intake/Output this shift: No intake/output data recorded.  PE: Gen:  Alert, NAD, pleasant Abd: Soft, ND, NT, PEG tube in place without obvious complications   Lab Results:  Recent Labs    01/09/23 0627 01/10/23 0456  WBC 19.3* 17.6*  HGB 10.7* 10.1*  HCT 32.8* 30.9*  PLT 267 262   BMET Recent Labs    01/09/23 0627 01/10/23 0456  NA 136 138  K 4.8 4.9  CL 104 108  CO2 21* 22  GLUCOSE 145* 210*  BUN 35* 49*  CREATININE 0.79 0.81  CALCIUM 8.4* 8.4*   PT/INR No results for input(s): "LABPROT", "INR" in the last 72 hours. CMP     Component Value Date/Time   NA 138 01/10/2023 0456   NA 143 12/07/2022 1535   NA 141 04/13/2011 1125   K 4.9 01/10/2023 0456   K 3.4 (L) 04/13/2011 1125   CL 108 01/10/2023 0456   CL 105 04/13/2011 1125   CO2 22 01/10/2023 0456   CO2 29 04/13/2011 1125   GLUCOSE 210 (H) 01/10/2023 0456   GLUCOSE 111 (H) 04/13/2011 1125   BUN 49 (H) 01/10/2023 0456   BUN 16 12/07/2022 1535   BUN 14 04/13/2011 1125   CREATININE 0.81 01/10/2023 0456   CREATININE 0.80 08/01/2018 1544   CALCIUM 8.4 (L) 01/10/2023 0456   CALCIUM 9.2 04/13/2011 1125   PROT 6.4 (L) 12/30/2022 0441   PROT 7.6 12/07/2022 1535   PROT 7.8 04/13/2011 1125   ALBUMIN 3.4 (L) 12/30/2022 0441   ALBUMIN 4.4 12/07/2022 1535   ALBUMIN 4.0 04/13/2011 1125   AST 29 12/30/2022 0441   AST 24 04/13/2011 1125   ALT 25 12/30/2022  0441   ALT 20 04/13/2011 1125   ALKPHOS 31 (L) 12/30/2022 0441   ALKPHOS 51 04/13/2011 1125   BILITOT 0.8 12/30/2022 0441   BILITOT 0.8 12/07/2022 1535   BILITOT 0.6 04/13/2011 1125   GFRNONAA >60 01/10/2023 0456   GFRNONAA 67 08/01/2018 1544   GFRAA 78 08/01/2018 1544   Lipase  No results found for: "LIPASE"  Studies/Results: DG CHEST PORT 1 VIEW  Result Date: 01/09/2023 CLINICAL DATA:  Respiratory abnormality. EXAM: PORTABLE CHEST 1 VIEW COMPARISON:  Chest radiograph dated 12/31/2022. FINDINGS: Significant improvement in the previously seen left lung opacities. Small left lung base density and possible trace pleural effusion. Faint areas of increased density in the right upper lobe also concerning for infiltrate. No pneumothorax. Mild cardiomegaly. No acute osseous pathology. IMPRESSION: 1. Significant improvement in the previously seen left lung opacities. 2. Small left lung base density and possible trace pleural effusion. 3. Faint areas of increased density in the right upper lobe also concerning for infiltrate. Electronically Signed   By: Elgie Collard M.D.   On: 01/09/2023 19:34    Anti-infectives: Anti-infectives (From admission, onward)    Start     Dose/Rate Route Frequency Ordered Stop   12/31/22 1100  cefTRIAXone (ROCEPHIN) 2 g in sodium chloride 0.9 % 100 mL IVPB        2 g 200 mL/hr over 30 Minutes Intravenous Every 24 hours 12/31/22 1037 01/06/23 1724        Assessment/Plan POD 1 s/p esophagogastroduodenoscopy (EGD) and percutaneous endoscopic gastrostomy (PEG) tube placement by Dr. Bedelia Person on 12/9 - Please flush tube with 30 ml (20 ml if fluid restricted) sterile water every 4 hours, before and after medication administration, and when continuous feeding are interrupted. Please keep abdominal binder in place.  - Okay to restart DOAC - We will sign off. Call back with questions or concerns   LOS: 12 days    Jacinto Halim , [facility]  Surgery 01/10/2023, 10:04 AM Please see Amion for pager number during day hours 7:00am-4:30pm

## 2023-01-10 NOTE — Progress Notes (Signed)
Speech Language Pathology Treatment: Dysphagia;Cognitive-Linquistic  Patient Details Name: Jocelyn [name] MRN: [id number] DOB: April 11, 1931 Today's Date: 01/10/2023 Time: 2952-8413 SLP Time Calculation (min) (ACUTE ONLY): 20 min  Assessment / Plan / Recommendation Clinical Impression  Pt with notable improvement towards dysphagia and cognitive linguistic goals this date. She remains confused and disoriented, but benefits from semantic cueing. Pt able to follow all one-step commands accurately. She was ~50% accurate with yes/no questions. Pt able to recall 1/4 novel words after a delay, which increased to 3/4 when given Max semantic cueing. Repetition and naming are intact. She has L inattention, requiring cueing to direct attention to objects placed on her L side. Given trials of ice chips, thin liquids, and purees, pt exhibits minimal oral holding and typically only observed to swallow x1. More receptive to challenging presentations. Even given improvements in pt's oral phase, question her ability to support herself nutritionally given attention deficits but do feel that she may benefit from a repeat MBS prior to d/c. SLP will f/u to complete MBS as scheduling allows, likely next date.    HPI HPI: 89 yo female presenting on 11/27 with L sided weakness and R MCA occlusion s/p IR revascularization with complicated arterial cannulation requiring multiple sticks. Intubated 11/28-11/29. PMH includes: GERD, HTN, CHF, AFIB RVR on eliquis, HLD, OSA      SLP Plan  MBS      Recommendations for follow up therapy are one component of a multi-disciplinary discharge planning process, led by the attending physician.  Recommendations may be updated based on patient status, additional functional criteria and insurance authorization.    Recommendations  Diet recommendations: NPO Medication Administration: Via alternative means                  Oral care QID;Staff/trained caregiver to provide oral  care   Frequent or constant Supervision/Assistance Dysphagia, oropharyngeal phase (R13.12);Cognitive communication deficit (R41.841);Attention and concentration deficit;Dysarthria and anarthria (R47.1)     MBS     Gwynneth Aliment, M.A., CF-SLP Speech Language Pathology, Acute Rehabilitation Services  Secure Chat preferred [phone]   01/10/2023, 5:16 PM

## 2023-01-10 NOTE — TOC Progression Note (Addendum)
Transition of Care [facility]) - Progression Note    Patient Details  Name: Jocelyn [name] MRN: [id number] Date of Birth: [birth year]  [facility] CM/SW Contact  Kermit Balo, RN Phone Number: 01/10/2023, 10:41 AM  Clinical Narrative:     Pts only bed offer is from [facility]. CM updated the pts daughter and she accepted the offer. CM has asked TOC MOA to start insurance auth for an admit tomorrow to [facility]. Pt is currently having tube feedings advanced. Facility is aware of tube feeding type and rate.  TOC following.  1508: pt has been approved for [facility]: Vesta Mixer [facility]#[id number]  She has a bed tomorrow for admission.  Expected Discharge Plan: Skilled Nursing Facility Barriers to Discharge: Continued Medical Work up  Expected Discharge Plan and Services   Discharge Planning Services: CM Consult Post Acute Care Choice: Long Term Acute Care (LTAC) Living arrangements for the past 2 months: Single Family Home                                       Social Determinants of Health (SDOH) Interventions SDOH Screenings   Food Insecurity: Patient Unable To Answer (12/30/2022)  Housing: Patient Unable To Answer (12/30/2022)  Transportation Needs: No Transportation Needs (12/30/2022)  Utilities: Not At Risk (12/30/2022)  Alcohol Screen: Low Risk  (12/03/2018)  Depression (PHQ2-9): Low Risk  (12/06/2022)  Financial Resource Strain: Medium Risk (07/26/2018)  Physical Activity: Inactive (07/26/2018)  Social Connections: Moderately Isolated (07/26/2018)  Stress: No Stress Concern Present (07/26/2018)  Tobacco Use: Low Risk  (01/09/2023)    Readmission Risk Interventions     No data to display

## 2023-01-10 NOTE — Progress Notes (Signed)
Occupational Therapy Treatment Patient Details Name: Jocelyn [name] MRN: [id number] DOB: Mar 04, 1931 Today's Date: 01/10/2023   History of present illness 89 y.o. female on 11/27 L sided weakness and R MCA occlusion intubated 11/28 IR re-vascularization complicated arterial cannulation requiring multiple sticks, extubated 11/29. PEG 12/9   PMH HTN CHF AFIB RVR on eliquis HLD OSA.   OT comments  Completed co-tx with PT to progress pt's sitting tolerance while completing sit to stand transitions using Stedy. Pt unable to achieve full standing posture although progressing towards completing functional transfers. Pt continues to have left visual neglect although improvement noted while using vase flowers in room for improving awareness. Pt continues to be appropriate for LTACH with continued OT services at discharge.       If plan is discharge home, recommend the following:  Two people to help with walking and/or transfers;Two people to help with bathing/dressing/bathroom;Supervision due to cognitive status   Equipment Recommendations  Wheelchair (measurements OT);Wheelchair cushion (measurements OT);Hospital bed;Hoyer lift;BSC/3in1    Recommendations for Other Services      Precautions / Restrictions Precautions Precautions: Fall Precaution Comments: watch O2 and BP Restrictions Weight Bearing Restrictions: No       Mobility Bed Mobility Overal bed mobility: Needs Assistance Bed Mobility: Rolling, Sidelying to Sit, Sit to Supine Rolling: Used rails, Max assist Sidelying to sit: Max assist, HOB elevated, Used rails   Sit to supine: Max assist, +2 for physical assistance   General bed mobility comments: max A for all aspects with decreased initiation    Transfers Overall transfer level: Needs assistance Equipment used:  (stedy) Transfers: Sit to/from Stand Sit to Stand: Max assist, +2 physical assistance           General transfer comment: max A to achieve 3/4 standing;  flexed at hips, knees and trunk; stood up to 70 seconds Transfer via Lift Equipment: Stedy   Balance Overall balance assessment: Needs assistance Sitting-balance support: Single extremity supported, Feet supported Sitting balance-Leahy Scale: Poor Sitting balance - Comments: lateral lean to L side, pt was able to prop onto R elbow and maintain static balance with CGA   Standing balance support: Bilateral upper extremity supported Standing balance-Leahy Scale: Zero Standing balance comment: Stedy used to complete 2 sit to stand transitions. Pt required max assist X2 to maintain standing balance. Tactile cues and VC provided to squeeze glutes and bring trunk upright to stand taller.                           ADL either performed or assessed with clinical judgement     Vision   Additional Comments: Utilized vase of flowers in room to increase left visual gaze when positioned on her left. Also used flowers to encourage pt to perform trunk flexion while seted on EOB.          Cognition Arousal: Alert Behavior During Therapy: Flat affect Overall Cognitive Status: Impaired/Different from baseline Area of Impairment: Following commands, Safety/judgement, Problem solving, Memory, Attention      Orientation Level: Person Current Attention Level: Sustained Memory: Decreased short-term memory Following Commands: Follows one step commands with increased time, Follows one step commands inconsistently Safety/Judgement: Decreased awareness of safety, Decreased awareness of deficits   Problem Solving: Slow processing, Decreased initiation, Difficulty sequencing, Requires verbal cues, Requires tactile cues General Comments: Patient mostly intelligible; speaking in short phrases  Pertinent Vitals/ Pain       Pain Assessment Pain Assessment: Faces Faces Pain Scale: Hurts little more Pain Location: L shoulder when moved Pain Descriptors / Indicators:  Grimacing Pain Intervention(s): Monitored during session, Limited activity within patient's tolerance         Frequency  Min 1X/week        Progress Toward Goals  OT Goals(current goals can now be found in the care plan section)  Progress towards OT goals: Progressing toward goals  Acute Rehab OT Goals Patient Stated Goal: to move  Plan      Co-evaluation      Reason for Co-Treatment: Necessary to address cognition/behavior during functional activity;Complexity of the patient's impairments (multi-system involvement);For patient/therapist safety;To address functional/ADL transfers PT goals addressed during session: Mobility/safety with mobility;Balance OT goals addressed during session: ADL's and self-care      AM-PAC OT "6 Clicks" Daily Activity     Outcome Measure   Help from another person eating meals?: Total (NPO) Help from another person taking care of personal grooming?: Total Help from another person toileting, which includes using toliet, bedpan, or urinal?: Total Help from another person bathing (including washing, rinsing, drying)?: Total Help from another person to put on and taking off regular upper body clothing?: Total Help from another person to put on and taking off regular lower body clothing?: Total 6 Click Score: 6    End of Session Equipment Utilized During Treatment: Gait belt;Other (comment) Jocelyn [name])  OT Visit Diagnosis: Unsteadiness on feet (R26.81);Muscle weakness (generalized) (M62.81)   Activity Tolerance Patient tolerated treatment well   Patient Left in bed;with call bell/phone within reach;with bed alarm set   Nurse Communication Mobility status        Time: 0454-0981 OT Time Calculation (min): 27 min  Charges: OT General Charges $OT Visit: 1 Visit OT Treatments $Therapeutic Activity: 8-22 mins  Jocelyn [name], OTR/L,CBIS  Supplemental OT - [facility] and [facility] Secure Chat Preferred    Jocelyn [name], Jocelyn [name] 01/10/2023, 4:32  PM

## 2023-01-10 NOTE — Progress Notes (Signed)
Physical Therapy Treatment Patient Details Name: Jocelyn [name] MRN: [id number] DOB: 09-13-1931 Today's Date: 01/10/2023   History of Present Illness 15 y.oo female on 11/27 L sided weakness and R MCA occlusion intubated 11/28 IR re-vascularization complicated arterial cannulation requiring multiple sticks, extubated 11/29. PEG 12/9   PMH HTN CHF AFIB RVR on eliquis HLD OSA.    PT Comments  Patient alert on arrival. Better communication and ability to follow commands. Continues to require +2 max assist for all levels of mobility, including standing with stedy. Noted standing (in part) limited by left knee contracture ~40 degrees.     If plan is discharge home, recommend the following: Two people to help with walking and/or transfers;Direct supervision/assist for medications management;Assist for transportation;Two people to help with bathing/dressing/bathroom;Assistance with cooking/housework;Assistance with feeding;Direct supervision/assist for financial management;Help with stairs or ramp for entrance;Supervision due to cognitive status   Can travel by private vehicle     No  Equipment Recommendations  None recommended by PT    Recommendations for Other Services       Precautions / Restrictions Precautions Precautions: Fall Precaution Comments: watch O2 and BP Restrictions Weight Bearing Restrictions: No     Mobility  Bed Mobility Overal bed mobility: Needs Assistance Bed Mobility: Rolling, Sidelying to Sit, Sit to Supine Rolling: Used rails, Max assist Sidelying to sit: Max assist, HOB elevated, Used rails   Sit to supine: Max assist, +2 for physical assistance   General bed mobility comments: max A for all aspects with decreased initiation    Transfers Overall transfer level: Needs assistance Equipment used:  (stedy) Transfers: Sit to/from Stand Sit to Stand: Max assist, +2 physical assistance           General transfer comment: max A to achieve 3/4 standing;  flexed at hips, knees and trunk; stood up to 70 seconds Transfer via Lift Equipment: Stedy  Ambulation/Gait                   Stairs             Wheelchair Mobility     Tilt Bed    Modified Rankin (Stroke Patients Only) Modified Rankin (Stroke Patients Only) Pre-Morbid Rankin Score: Slight disability (per RNCM note pt living alone) Modified Rankin: Severe disability     Balance Overall balance assessment: Needs assistance Sitting-balance support: Single extremity supported, Feet supported Sitting balance-Leahy Scale: Poor Sitting balance - Comments: lateral lean to L side, pt was able to prop onto R elbow and maintain static balance with CGA   Standing balance support: Bilateral upper extremity supported Standing balance-Leahy Scale: Zero                              Cognition Arousal: Alert Behavior During Therapy: Flat affect Overall Cognitive Status: Impaired/Different from baseline Area of Impairment: Following commands, Safety/judgement, Problem solving, Memory, Attention                   Current Attention Level: Sustained Memory: Decreased short-term memory Following Commands: Follows one step commands with increased time, Follows one step commands inconsistently Safety/Judgement: Decreased awareness of safety, Decreased awareness of deficits   Problem Solving: Slow processing, Decreased initiation, Difficulty sequencing, Requires verbal cues, Requires tactile cues General Comments: Patient mostly intelligible; speaking in short phrases        Exercises Other Exercises Other Exercises: PROM LLE with pt lacking ~40 degrees knee extension  General Comments        Pertinent Vitals/Pain Pain Assessment Pain Assessment: Faces Faces Pain Scale: No hurt    Home Living                          Prior Function            PT Goals (current goals can now be found in the care plan section) Acute Rehab PT  Goals Patient Stated Goal: speech clearer; no goal stated PT Goal Formulation: Patient unable to participate in goal setting Time For Goal Achievement: 01/13/23 Potential to Achieve Goals: Fair Progress towards PT goals: Progressing toward goals    Frequency    Min 1X/week      PT Plan      Co-evaluation PT/OT/SLP Co-Evaluation/Treatment: Yes Reason for Co-Treatment: Necessary to address cognition/behavior during functional activity;Complexity of the patient's impairments (multi-system involvement);For patient/therapist safety;To address functional/ADL transfers PT goals addressed during session: Mobility/safety with mobility;Balance OT goals addressed during session: ADL's and self-care      AM-PAC PT "6 Clicks" Mobility   Outcome Measure  Help needed turning from your back to your side while in a flat bed without using bedrails?: Total Help needed moving from lying on your back to sitting on the side of a flat bed without using bedrails?: Total Help needed moving to and from a bed to a chair (including a wheelchair)?: Total Help needed standing up from a chair using your arms (e.g., wheelchair or bedside chair)?: Total Help needed to walk in hospital room?: Total Help needed climbing 3-5 steps with a railing? : Total 6 Click Score: 6    End of Session Equipment Utilized During Treatment: Gait belt Activity Tolerance: Patient tolerated treatment well Patient left: in bed;with bed alarm set;with call bell/phone within reach   PT Visit Diagnosis: Other abnormalities of gait and mobility (R26.89);Hemiplegia and hemiparesis;Other symptoms and signs involving the nervous system (R29.898) Hemiplegia - Right/Left: Left Hemiplegia - dominant/non-dominant: Non-dominant Hemiplegia - caused by: Cerebral infarction     Time: 1751-0258 PT Time Calculation (min) (ACUTE ONLY): 27 min  Charges:    $Therapeutic Activity: 8-22 mins PT General Charges $$ ACUTE PT VISIT: 1  Visit                      Jerolyn Center, PT Acute Rehabilitation Services  Office [phone]    Zena Amos 01/10/2023, 2:49 PM

## 2023-01-11 ENCOUNTER — Inpatient Hospital Stay (HOSPITAL_COMMUNITY): Payer: 59

## 2023-01-11 ENCOUNTER — Other Ambulatory Visit: Payer: Self-pay | Admitting: Neurology

## 2023-01-11 DIAGNOSIS — I639 Cerebral infarction, unspecified: Secondary | ICD-10-CM

## 2023-01-11 LAB — CBC
HCT: 28.2 % — ABNORMAL LOW (ref 36.0–46.0)
Hemoglobin: 9.5 g/dL — ABNORMAL LOW (ref 12.0–15.0)
MCH: 27.9 pg (ref 26.0–34.0)
MCHC: 33.7 g/dL (ref 30.0–36.0)
MCV: 82.7 fL (ref 80.0–100.0)
Platelets: 308 10*3/uL (ref 150–400)
RBC: 3.41 MIL/uL — ABNORMAL LOW (ref 3.87–5.11)
RDW: 14.4 % (ref 11.5–15.5)
WBC: 17.5 10*3/uL — ABNORMAL HIGH (ref 4.0–10.5)
nRBC: 0 % (ref 0.0–0.2)

## 2023-01-11 LAB — BASIC METABOLIC PANEL
Anion gap: 7 (ref 5–15)
BUN: 53 mg/dL — ABNORMAL HIGH (ref 8–23)
CO2: 20 mmol/L — ABNORMAL LOW (ref 22–32)
Calcium: 8.5 mg/dL — ABNORMAL LOW (ref 8.9–10.3)
Chloride: 109 mmol/L (ref 98–111)
Creatinine, Ser: 0.94 mg/dL (ref 0.44–1.00)
GFR, Estimated: 58 mL/min — ABNORMAL LOW (ref >60)
Glucose, Bld: 174 mg/dL — ABNORMAL HIGH (ref 70–99)
Potassium: 4.8 mmol/L (ref 3.5–5.1)
Sodium: 136 mmol/L (ref 135–145)

## 2023-01-11 LAB — GLUCOSE, CAPILLARY
Glucose-Capillary: 132 mg/dL — ABNORMAL HIGH (ref 70–99)
Glucose-Capillary: 135 mg/dL — ABNORMAL HIGH (ref 70–99)
Glucose-Capillary: 160 mg/dL — ABNORMAL HIGH (ref 70–99)
Glucose-Capillary: 168 mg/dL — ABNORMAL HIGH (ref 70–99)

## 2023-01-11 MED ORDER — IRBESARTAN 150 MG PO TABS: 150.0000 mg | ORAL_TABLET | Freq: Every day | ORAL | 0 refills | Status: DC

## 2023-01-11 MED ORDER — APIXABAN 5 MG PO TABS
5.0000 mg | ORAL_TABLET | Freq: Two times a day (BID) | ORAL | Status: DC
  Administered 2023-01-11: 5 mg
  Filled 2023-01-11: qty 1

## 2023-01-11 MED ORDER — NYSTATIN 100000 UNIT/ML MT SUSP: 5.0000 mL | Freq: Four times a day (QID) | OROMUCOSAL | 0 refills | Status: DC

## 2023-01-11 MED ORDER — IPRATROPIUM-ALBUTEROL 0.5-2.5 (3) MG/3ML IN SOLN: 3.0000 mL | Freq: Four times a day (QID) | RESPIRATORY_TRACT | 0 refills | Status: DC | PRN

## 2023-01-11 MED ORDER — WHITE PETROLATUM EX OINT: 1.0000 | TOPICAL_OINTMENT | CUTANEOUS | 0 refills | Status: DC | PRN

## 2023-01-11 MED ORDER — LIDOCAINE 5 % EX PTCH
2.0000 | MEDICATED_PATCH | CUTANEOUS | Status: DC
  Administered 2023-01-11: 2 via TRANSDERMAL
  Filled 2023-01-11: qty 2

## 2023-01-11 MED ORDER — HYDROCHLOROTHIAZIDE 12.5 MG PO TABS: 12.5000 mg | ORAL_TABLET | Freq: Every day | ORAL | 0 refills | Status: DC

## 2023-01-11 MED ORDER — HYDROCODONE-ACETAMINOPHEN 5-325 MG PO TABS: 1.0000 | ORAL_TABLET | Freq: Four times a day (QID) | ORAL | 0 refills | Status: DC | PRN

## 2023-01-11 MED ORDER — AMLODIPINE BESYLATE 10 MG PO TABS: 10.0000 mg | ORAL_TABLET | Freq: Every day | ORAL | 0 refills | Status: DC

## 2023-01-11 MED ORDER — SPIRONOLACTONE 25 MG PO TABS: 12.5000 mg | ORAL_TABLET | Freq: Every day | ORAL | 0 refills | Status: DC

## 2023-01-11 MED ORDER — ATORVASTATIN CALCIUM 40 MG PO TABS: 40.0000 mg | ORAL_TABLET | Freq: Every day | ORAL | 0 refills | Status: DC

## 2023-01-11 MED ORDER — LIDOCAINE 5 % EX PTCH: 2.0000 | MEDICATED_PATCH | CUTANEOUS | 0 refills | Status: DC

## 2023-01-11 MED ORDER — FREE WATER: 100.0000 mL | 0 refills | Status: DC

## 2023-01-11 MED ORDER — LIDOCAINE 5 % EX PTCH: 2.0000 | MEDICATED_PATCH | CUTANEOUS | Status: DC

## 2023-01-11 MED ORDER — ACETAMINOPHEN 325 MG PO TABS: 650.0000 mg | ORAL_TABLET | ORAL | 0 refills | Status: DC | PRN

## 2023-01-11 MED ORDER — FAMOTIDINE 20 MG PO TABS: 20.0000 mg | ORAL_TABLET | Freq: Every day | ORAL | 0 refills | Status: DC

## 2023-01-11 MED ORDER — APIXABAN 5 MG PO TABS: 5.0000 mg | ORAL_TABLET | Freq: Two times a day (BID) | ORAL | 0 refills | Status: DC

## 2023-01-11 MED ORDER — OSMOLITE 1.2 CAL PO LIQD: 1000.0000 mL | ORAL | 0 refills | Status: DC

## 2023-01-11 NOTE — Procedures (Signed)
Modified Barium Swallow Study  Patient Details  Name: Jocelyn [name] MRN: [id number] Date of Birth: February 07, 1931  Today's Date: 01/11/2023  Modified Barium Swallow completed.  Full report located under Chart Review in the Imaging Section.  History of Present Illness 89 yo female presenting on 11/27 with L sided weakness and R MCA occlusion s/p IR revascularization with complicated arterial cannulation requiring multiple sticks. Intubated 11/28-11/29. PMH includes: GERD, HTN, CHF, AFIB RVR on eliquis, HLD, OSA. MBSS 01/02/23 was limited due to oral holding.   Clinical Impression Pt presents with s/sx moderate oral dysphagia c/b intermittent oral holding, repetitive/disorganized lingual motion, prolonged mastication, intermittent piecemeal swallow, and trace-mild oral stasis. Oral deficits likely due to dental status and exacerbated by cognitive deficits. Pharyngeal swallow c/b  trace-mild pharyngeal stasis which was mainly in vallecula, but also observed on posterior pharyngeal swallow and pyriform sinus. Residual secondary to decreased base of tongue retraction and reduced pharyngeal stripping wave in setting of suspected cervical osteophytes.  Age-related swallow changes appreciated including pre-swallow and during the swallow (on secondary swallows) penetration occured with x1 very large straw sip of then when used as a liquid wash for solids as well as swallow initiation at the pyriform sinus. Concern for possible pharyngoesophageal component to pt's dysphagia given prominent appearance to the cricopharyngeus. Given today's findings, recommend initiation of a puree diet with thin liquids with safe swallowing strategies/aspiration precautions as outlined below. Concern for pt's ability to meet oral needs nutritionally given severity of oral deficits. Pt OK for solids to be advanced clinically. SLP to continue to f/u per POC.  Factors that may increase risk of adverse event in presence of aspiration  [name] & [name] 2021): Reduced cognitive function;Limited mobility;Frail or deconditioned;Dependence for feeding and/or oral hygiene  Swallow Evaluation Recommendations Recommendations: PO diet PO Diet Recommendation: Dysphagia 1 (Pureed);Thin liquids (Level 0) Liquid Administration via: Spoon;Cup;Straw Medication Administration: Crushed with puree Supervision: Staff to assist with self-feeding;Full assist for feeding;Full supervision/cueing for swallowing strategies Swallowing strategies  : Minimize environmental distractions;Slow rate;Small bites/sips Postural changes: Position pt fully upright for meals;Stay upright 30-60 min after meals Oral care recommendations: Oral care QID (4x/day);Staff/trained caregiver to provide oral care Recommended consults: Consider dietitian consultation Caregiver Recommendations: Have oral suction available     [name], M.S., CCC-SLP Speech-Language Pathologist Secure Chat Preferred  O: [phone]  [name] 01/11/2023,9:13 AM

## 2023-01-11 NOTE — TOC Transition Note (Signed)
Transition of Care [facility]) - CM/SW Discharge Note   Patient Details  Name: Jocelyn [name] MRN: [id number] Date of Birth: July 31, 1931  [facility] CM/SW Contact:  Kermit Balo, RN Phone Number: 01/11/2023, 1:49 PM   Clinical Narrative:     Pt is discharging to [facility]. She will transport via [facility]. Daughter at the bedside and in agreement.   Room: 701 Number for report: [phone]  Final next level of care: Skilled Nursing Facility Barriers to Discharge: No Barriers Identified   Patient Goals and CMS Choice CMS Medicare.gov Compare Post Acute Care list provided to:: Patient Represenative (must comment) Choice offered to / list presented to : Adult Children  Discharge Placement                Patient chooses bed at: [facility] [place] Patient to be transferred to facility by: [facility] Name of family member notified: [name]--daughter at the bedside Patient and family notified of of transfer: 01/11/23  Discharge Plan and Services Additional resources added to the After Visit Summary for     Discharge Planning Services: CM Consult Post Acute Care Choice: Long Term Acute Care (LTAC)                               Social Determinants of Health (SDOH) Interventions SDOH Screenings   Food Insecurity: Patient Unable To Answer (12/30/2022)  Housing: Patient Unable To Answer (12/30/2022)  Transportation Needs: No Transportation Needs (12/30/2022)  Utilities: Not At Risk (12/30/2022)  Alcohol Screen: Low Risk  (12/03/2018)  Depression (PHQ2-9): Low Risk  (12/06/2022)  Financial Resource Strain: Medium Risk (07/26/2018)  Physical Activity: Inactive (07/26/2018)  Social Connections: Moderately Isolated (07/26/2018)  Stress: No Stress Concern Present (07/26/2018)  Tobacco Use: Low Risk  (01/09/2023)     Readmission Risk Interventions     No data to display

## 2023-01-11 NOTE — Discharge Summary (Addendum)
Stroke Discharge Summary  Patient ID: Jocelyn [name]   MRN: [id number]      DOB: [birth year]  Date of Admission: 12/29/2022 Date of Discharge: 01/11/2023  Attending Physician:  Stroke, Md, MD Consultant(s):     trauma surgery   Patient's PCP:  Gorden Harms, PA-C  DISCHARGE PRIMARY DIAGNOSIS:   Acute Ischemic Infarct:  right MCA territory infarct with R M2 and A1 occlusion s/p IR with TICI 3 revascularization, etiology: Cardioembolic in the setting of A-fib   Patient Active Problem List   Diagnosis Date Noted   Acute ischemic stroke (HCC) 12/29/2022   Coronary artery disease of native artery of native heart with stable angina pectoris (HCC) 04/21/2022   Coronary artery calcification 10/27/2021   Aortic atherosclerosis (HCC) 10/27/2021   Lumbar facet arthropathy 03/22/2018   SI joint arthritis (HCC) 03/22/2018   Chronic pain of both knees 03/22/2018   Chronic pain syndrome 03/22/2018   Right sided colitis 12/30/2017   Impaired fasting glucose 02/06/2017   Carpal tunnel syndrome on right 09/05/2015   Gastroesophageal reflux disease without esophagitis 09/05/2015   Hx of iron deficiency anemia 09/02/2015   OSA on CPAP 08/01/2015   Bilateral primary osteoarthritis of knee 08/01/2015   Controlled substance agreement signed 07/08/2015   Essential hypertension 07/28/2014   Lumbar degenerative disc disease 07/28/2014   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   Stable angina (HCC) 07/10/2013   Hyperlipidemia LDL goal <70 07/10/2013   Cerebrovascular accident (CVA) (HCC) 07/10/2013   Dyspnea on exertion 07/10/2013   Hearing loss 01/29/2009   Arthritis sicca 05/23/2006     Secondary Diagnoses: Hypertension Hyperlipidemia Atrial fibrillation Dysphagia  OSA on CPAP Leukocytosis    Allergies as of 01/11/2023   No Known Allergies      Medication List     STOP taking these medications    albuterol 108 (90 Base) MCG/ACT inhaler Commonly known as: VENTOLIN HFA    clotrimazole 1 % cream Commonly known as: LOTRIMIN   furosemide 40 MG tablet Commonly known as: LASIX   isosorbide mononitrate 120 MG 24 hr tablet Commonly known as: IMDUR   Olmesartan-amLODIPine-HCTZ 40-10-12.5 MG Tabs   omeprazole 20 MG capsule Commonly known as: PRILOSEC       TAKE these medications    acetaminophen 325 MG tablet Commonly known as: TYLENOL Take 2 tablets (650 mg total) by mouth every 4 (four) hours as needed for mild pain (pain score 1-3) (or temp > 37.5 C (99.5 F)).   alendronate 70 MG tablet Commonly known as: FOSAMAX Take 70 mg by mouth once a week.   amLODipine 10 MG tablet Commonly known as: NORVASC Place 1 tablet (10 mg total) into feeding tube daily. Start taking on: January 12, 2023   apixaban 5 MG Tabs tablet Commonly known as: ELIQUIS Place 1 tablet (5 mg total) into feeding tube 2 (two) times daily. What changed: how to take this   atorvastatin 40 MG tablet Commonly known as: LIPITOR Place 1 tablet (40 mg total) into feeding tube daily. Start taking on: January 12, 2023 What changed:  how to take this Another medication with the same name was removed. Continue taking this medication, and follow the directions you see here.   calcium citrate-vitamin D 500-400 MG-UNIT chewable tablet Chew 1 tablet by mouth 2 (two) times daily.   famotidine 20 MG tablet Commonly known as: PEPCID Place 1 tablet (20 mg total) into feeding tube daily. Start taking on: January 12, 2023   feeding  supplement (OSMOLITE 1.2 CAL) Liqd Place 1,000 mLs into feeding tube continuous.   ferrous sulfate 325 (65 FE) MG tablet Take 1 tablet (325 mg total) by mouth daily.   free water Soln Place 100 mLs into feeding tube every 4 (four) hours.   hydrochlorothiazide 12.5 MG tablet Commonly known as: HYDRODIURIL Place 1 tablet (12.5 mg total) into feeding tube daily. Start taking on: January 12, 2023   HYDROcodone-acetaminophen 5-325 MG tablet Commonly  known as: NORCO/VICODIN Take 1 tablet by mouth every 6 (six) hours as needed for moderate pain (pain score 4-6). What changed:  when to take this reasons to take this additional instructions Another medication with the same name was removed. Continue taking this medication, and follow the directions you see here.   ipratropium-albuterol 0.5-2.5 (3) MG/3ML Soln Commonly known as: DUONEB Take 3 mLs by nebulization every 6 (six) hours as needed.   irbesartan 150 MG tablet Commonly known as: AVAPRO Place 1 tablet (150 mg total) into feeding tube daily. Start taking on: January 12, 2023   lidocaine 5 % Commonly known as: LIDODERM Place 2 patches onto the skin daily. Remove & Discard patch within 12 hours or as directed by MD   nystatin 100000 UNIT/ML suspension Commonly known as: MYCOSTATIN Take 5 mLs (500,000 Units total) by mouth 4 (four) times daily.   spironolactone 25 MG tablet Commonly known as: ALDACTONE Place 0.5 tablets (12.5 mg total) into feeding tube daily. Start taking on: January 12, 2023 What changed: how to take this   white petrolatum Oint Commonly known as: VASELINE Apply 1 Application topically as needed for lip care.        LABORATORY STUDIES CBC    Component Value Date/Time   WBC 17.5 (H) 01/11/2023 0551   RBC 3.41 (L) 01/11/2023 0551   HGB 9.5 (L) 01/11/2023 0551   HGB 12.5 09/19/2022 0916   HCT 28.2 (L) 01/11/2023 0551   HCT 39.6 09/19/2022 0916   PLT 308 01/11/2023 0551   PLT 169 09/19/2022 0916   MCV 82.7 01/11/2023 0551   MCV 87 09/19/2022 0916   MCV 82 04/13/2011 1125   MCH 27.9 01/11/2023 0551   MCHC 33.7 01/11/2023 0551   RDW 14.4 01/11/2023 0551   RDW 12.9 09/19/2022 0916   RDW 14.4 04/13/2011 1125   LYMPHSABS 1.1 12/29/2022 1706   LYMPHSABS 3.7 (H) 07/08/2015 1136   MONOABS 0.2 12/29/2022 1706   EOSABS 0.0 12/29/2022 1706   EOSABS 0.2 07/08/2015 1136   BASOSABS 0.0 12/29/2022 1706   BASOSABS 0.1 07/08/2015 1136   CMP     Component Value Date/Time   NA 136 01/11/2023 0551   NA 143 12/07/2022 1535   NA 141 04/13/2011 1125   K 4.8 01/11/2023 0551   K 3.4 (L) 04/13/2011 1125   CL 109 01/11/2023 0551   CL 105 04/13/2011 1125   CO2 20 (L) 01/11/2023 0551   CO2 29 04/13/2011 1125   GLUCOSE 174 (H) 01/11/2023 0551   GLUCOSE 111 (H) 04/13/2011 1125   BUN 53 (H) 01/11/2023 0551   BUN 16 12/07/2022 1535   BUN 14 04/13/2011 1125   CREATININE 0.94 01/11/2023 0551   CREATININE 0.80 08/01/2018 1544   CALCIUM 8.5 (L) 01/11/2023 0551   CALCIUM 9.2 04/13/2011 1125   PROT 6.4 (L) 12/30/2022 0441   PROT 7.6 12/07/2022 1535   PROT 7.8 04/13/2011 1125   ALBUMIN 3.4 (L) 12/30/2022 0441   ALBUMIN 4.4 12/07/2022 1535   ALBUMIN 4.0 04/13/2011 1125  AST 29 12/30/2022 0441   AST 24 04/13/2011 1125   ALT 25 12/30/2022 0441   ALT 20 04/13/2011 1125   ALKPHOS 31 (L) 12/30/2022 0441   ALKPHOS 51 04/13/2011 1125   BILITOT 0.8 12/30/2022 0441   BILITOT 0.8 12/07/2022 1535   BILITOT 0.6 04/13/2011 1125   GFRNONAA 58 (L) 01/11/2023 0551   GFRNONAA 67 08/01/2018 1544   GFRAA 78 08/01/2018 1544   COAGS Lab Results  Component Value Date   INR 1.1 12/29/2022   Lipid Panel    Component Value Date/Time   CHOL 105 12/30/2022 0441   CHOL 128 03/02/2020 1051   TRIG 142 12/30/2022 0441   TRIG 142 12/30/2022 0441   HDL 44 12/30/2022 0441   HDL 55 03/02/2020 1051   CHOLHDL 2.4 12/30/2022 0441   VLDL 28 12/30/2022 0441   LDLCALC 33 12/30/2022 0441   LDLCALC 60 03/02/2020 1051   LDLCALC 120 (H) 08/01/2018 1544   HgbA1C  Lab Results  Component Value Date   HGBA1C 6.4 (H) 12/30/2022   Urine Drug Screen negative Alcohol Level    Component Value Date/Time   ETH <10 12/29/2022 1117     SIGNIFICANT DIAGNOSTIC STUDIES DG CHEST PORT 1 VIEW  Result Date: 01/09/2023 CLINICAL DATA:  Respiratory abnormality. EXAM: PORTABLE CHEST 1 VIEW COMPARISON:  Chest radiograph dated 12/31/2022. FINDINGS: Significant improvement  in the previously seen left lung opacities. Small left lung base density and possible trace pleural effusion. Faint areas of increased density in the right upper lobe also concerning for infiltrate. No pneumothorax. Mild cardiomegaly. No acute osseous pathology. IMPRESSION: 1. Significant improvement in the previously seen left lung opacities. 2. Small left lung base density and possible trace pleural effusion. 3. Faint areas of increased density in the right upper lobe also concerning for infiltrate. Electronically Signed   By: Elgie Collard M.D.   On: 01/09/2023 19:34   DG Swallowing Func-Speech Pathology  Result Date: 01/02/2023 Table formatting from the original result was not included. Modified Barium Swallow Study Patient Details Name: Jocelyn [name] MRN: [id number] Date of Birth: May 14, 1931 Today's Date: 01/02/2023 HPI/PMH: HPI: 89 yo female presenting on 11/27 with L sided weakness and R MCA occlusion s/p IR revascularization with complicated arterial cannulation requiring multiple sticks. Intubated 11/28-11/29. PMH includes: GERD, HTN, CHF, AFIB RVR on eliquis, HLD, OSA Clinical Impression: Clinical Impression: Pt had significant oral holding during MBS that limited ability to assess pharyngeal swallow. This appeared to be quite increased from bedside trials (question related to taste of barium/environment in radiology) but could potentially be contributing to piecemeal swallowing/multiple swallows observed at bedside. Large volumes of anterior loss were noted and oral holding was quite prolonged even with multimodal cueing. Of the few times she swallowed, it was mostly liquids that had spilled anteriorly into her pharynx as opposed to initiation of lingual transit. She swallowed x1 with puree with no posterior transit of the bolus at all. Oral suction was used to remove it. When she does swallow, she has reduced base of tongue retraction, hyolaryngeal movement, and laryngeal vestibule closure. Homero Fellers  penetration without clearance was noted with honey thick liquids and thin liquids (PAS 3). No penetration or aspiration was seen with nectar thick liquids but this could not be observed over multiple trials. Recommend that she remain NPO for now with ongoing trials clinically with SLP to assess with more challenging with POs. Factors that may increase risk of adverse event in presence of aspiration [name] & [name] 2021): Factors  that may increase risk of adverse event in presence of aspiration [name] & [name] 2021): Reduced cognitive function; Limited mobility; Frail or deconditioned; Dependence for feeding and/or oral hygiene; Presence of tubes (ETT, trach, NG, etc.) Recommendations/Plan: Swallowing Evaluation Recommendations Swallowing Evaluation Recommendations Recommendations: NPO; Ice chips PRN after oral care (few pieces of ice with nursing after oral care) Medication Administration: Via alternative means Oral care recommendations: Oral care QID (4x/day); Oral care before ice chips/water Caregiver Recommendations: Have oral suction available Treatment Plan Treatment Plan Treatment recommendations: Therapy as outlined in treatment plan below Follow-up recommendations: Skilled nursing-short term rehab (<3 hours/day) Functional status assessment: Patient has had a recent decline in their functional status and demonstrates the ability to make significant improvements in function in a reasonable and predictable amount of time. Treatment frequency: Min 2x/week Treatment duration: 2 weeks Interventions: Aspiration precaution training; Patient/family education; Compensatory techniques; Trials of upgraded texture/liquids Recommendations Recommendations for follow up therapy are one component of a multi-disciplinary discharge planning process, led by the attending physician.  Recommendations may be updated based on patient status, additional functional criteria and insurance authorization. Assessment: Orofacial  Exam: Orofacial Exam Oral Cavity: Oral Hygiene: WFL Oral Cavity - Dentition: Edentulous Orofacial Anatomy: WFL Anatomy: Anatomy: Suspected cervical osteophytes (small) Boluses Administered: Boluses Administered Boluses Administered: Thin liquids (Level 0); Mildly thick liquids (Level 2, nectar thick); Moderately thick liquids (Level 3, honey thick); Puree  Oral Impairment Domain: Oral Impairment Domain Lip Closure: Escape beyond mid-chin Tongue control during bolus hold: Not tested (pt already with oral holding, so not directly cued to hold) Bolus preparation/mastication: -- (solids were deferred due to extent of oral holdign) Bolus transport/lingual motion: Minimal-no tongue motion Oral residue: Minimal to no clearance Location of oral residue : Tongue; Floor of mouth Initiation of pharyngeal swallow : Pyriform sinuses  Pharyngeal Impairment Domain: Pharyngeal Impairment Domain Soft palate elevation: No bolus between soft palate (SP)/pharyngeal wall (PW) Laryngeal elevation: Partial superior movement of thyroid cartilage/partial approximation of arytenoids to epiglottic petiole Anterior hyoid excursion: Partial anterior movement Epiglottic movement: Complete inversion Laryngeal vestibule closure: Incomplete, narrow column air/contrast in laryngeal vestibule Pharyngeal stripping wave : Present - complete Pharyngeal contraction (A/P view only): N/A Pharyngoesophageal segment opening: Complete distension and complete duration, no obstruction of flow Tongue base retraction: Narrow column of contrast or air between tongue base and PPW Pharyngeal residue: Collection of residue within or on pharyngeal structures Location of pharyngeal residue: Valleculae  Esophageal Impairment Domain: Esophageal Impairment Domain Esophageal clearance upright position: -- (deferred) Pill: Pill Consistency administered: -- (deferred) Penetration/Aspiration Scale Score: Penetration/Aspiration Scale Score 1.  Material does not enter airway:  Mildly thick liquids (Level 2, nectar thick) 3.  Material enters airway, remains ABOVE vocal cords and not ejected out: Thin liquids (Level 0); Moderately thick liquids (Level 3, honey thick) Compensatory Strategies: Compensatory Strategies Compensatory strategies: Yes Straw: Ineffective Ineffective Straw: Thin liquid (Level 0); Mildly thick liquid (Level 2, nectar thick) Liquid wash: Ineffective Ineffective Liquid Wash: Thin liquid (Level 0); Mildly thick liquid (Level 2, nectar thick); Moderately thick liquid (Level 3, honey thick); Puree   General Information: Caregiver present: No  Diet Prior to this Study: NPO; Cortrak/Small bore NG tube   Temperature : Normal   Respiratory Status: WFL   Supplemental O2: Nasal cannula   History of Recent Intubation: Yes  Behavior/Cognition: Alert; Cooperative; Pleasant mood; Requires cueing Self-Feeding Abilities: Dependent for feeding Baseline vocal quality/speech: Normal No data recorded Volitional Swallow: Unable to elicit Exam Limitations: No limitations Goal Planning: Prognosis  for improved oropharyngeal function: Good Barriers to Reach Goals: Cognitive deficits No data recorded Patient/Family Stated Goal: asking for a ginger ale Consulted and agree with results and recommendations: Patient Pain: Pain Assessment Pain Assessment: Faces Faces Pain Scale: 0 Breathing: 1 Negative Vocalization: 1 Facial Expression: 1 Body Language: 0 Consolability: 1 PAINAD Score: 4 Facial Expression: 0 Body Movements: 0 Muscle Tension: 0 Compliance with ventilator (intubated pts.): N/A Vocalization (extubated pts.): 0 CPOT Total: 0 End of Session: Start Time:SLP Start Time (ACUTE ONLY): 1053 Stop Time: SLP Stop Time (ACUTE ONLY): 1111 Time Calculation:SLP Time Calculation (min) (ACUTE ONLY): 18 min Charges: SLP Evaluations $ SLP Speech Visit: 1 Visit SLP Evaluations $BSS Swallow: 1 Procedure $MBS Swallow: 1 Procedure $ SLP EVAL LANGUAGE/SOUND PRODUCTION: 1 Procedure $Swallowing Treatment: 1  Procedure SLP visit diagnosis: SLP Visit Diagnosis: Dysphagia, oropharyngeal phase (R13.12) Past Medical History: Past Medical History: Diagnosis Date  Arthritis of both knees 08/01/2015  Colitis   Essential hypertension, benign 07/28/2014  GERD (gastroesophageal reflux disease)   Hx of iron deficiency anemia 09/02/2015  Hyperlipidemia   Hypertension   Lumbar pain   Medicare annual wellness visit, subsequent 04/07/2017  OSA on CPAP 08/01/2015  Overweight (BMI 25.0-29.9) 07/10/2013  Prediabetes 09/02/2015  SOB (shortness of breath) 07/10/2013 Past Surgical History: Past Surgical History: Procedure Laterality Date  ABDOMINAL HYSTERECTOMY    CATARACT EXTRACTION    CHOLECYSTECTOMY    COLONOSCOPY WITH PROPOFOL N/A 04/09/2018  Procedure: COLONOSCOPY WITH PROPOFOL;  Surgeon: Toney Reil, MD;  Location: [facility] ENDOSCOPY;  Service: Gastroenterology;  Laterality: N/A;  IR CT HEAD LTD  12/29/2022  IR PERCUTANEOUS ART THROMBECTOMY/INFUSION INTRACRANIAL INC DIAG ANGIO  12/29/2022  IR US GUIDE VASC ACCESS RIGHT  12/29/2022  IR US GUIDE VASC ACCESS RIGHT  12/29/2022  LUMBAR DISC SURGERY    RADIOLOGY WITH ANESTHESIA N/A 12/29/2022  Procedure: IR WITH ANESTHESIA;  Surgeon: Radiologist, Medication, MD;  Location: [facility] OR;  Service: Radiology;  Laterality: N/A; Mahala Menghini., M.A. CCC-SLP Acute Rehabilitation Services Office [phone] Secure chat preferred 01/02/2023, 12:48 PM  DG CHEST PORT 1 VIEW  Result Date: 12/31/2022 CLINICAL DATA:  Aspiration into airway EXAM: PORTABLE CHEST 1 VIEW COMPARISON:  12/29/2022 FINDINGS: Extensive opacification of the left chest with volume loss. Marked cardiomegaly. Mitral annular calcification that is bulky. The right lung remains clear without pulmonary edema. Feeding tube at least reaches the stomach. IMPRESSION: Significant opacification and volume loss of the left lung since prior. Electronically Signed   By: Tiburcio Pea M.D.   On: 12/31/2022 10:22   DG Abd Portable 1V  Result Date:  12/30/2022 CLINICAL DATA:  Feeding tube placement. EXAM: PORTABLE ABDOMEN - 1 VIEW COMPARISON:  December 29, 2022. FINDINGS: Distal tip of feeding tube is seen in expected position of distal stomach. IMPRESSION: Distal tip of feeding tube seen in expected position of distal stomach. Electronically Signed   By: Lupita Raider M.D.   On: 12/30/2022 13:17   IR PERCUTANEOUS ART THROMBECTOMY/INFUSION INTRACRANIAL INC DIAG ANGIO  Result Date: 12/30/2022 INDICATION: Jocelyn [name] is an 89 year-old female presenting with left-sided weakness, right gaze deviation and altered mental status; NIHSS 19. Her last known well was 11 p.m. on 12/28/2022. Her past medical history significant for GERD, prediabetes, permanent atrial fibrillation on eliquis, HTN, HLD, OSA; baseline modified Rankin scale 1. Head CT showed hypodensity of the right patella and basal ganglia (ASPECTS 7). No IV thrombolytic a vein as patient was outside the window. CT angiogram of the head and neck  showed an occlusion of a proximal right M2/MCA and proximal right A1/ACA with the ACA clot extending into the ICA terminus which remains patent. EXAM: ULTRASOUND-GUIDED VASCULAR ACCESS (X2) DIAGNOSTIC CEREBRAL ANGIOGRAM MECHANICAL THROMBECTOMY FLAT PANEL HEAD CT COMPARISON:  CT/CT angiogram of the head and neck December 29, 2022. MEDICATIONS: No antibiotics utilized. ANESTHESIA/SEDATION: The procedure was performed under general anesthesia. CONTRAST:  100 mL of Omnipaque 300 milligram/mL FLUOROSCOPY: Radiation Exposure Index (as provided by the fluoroscopic device): 1,190 mGy Kerma COMPLICATIONS: None immediate. TECHNIQUE: Informed written consent was obtained from the patient's daughter after a thorough discussion of the procedural risks, benefits and alternatives. All questions were addressed. Maximal Sterile Barrier Technique was utilized including caps, mask, sterile gowns, sterile gloves, sterile drape, hand hygiene and skin antiseptic. A timeout was  performed prior to the initiation of the procedure. The right groin was prepped and draped in the usual sterile fashion. Using a micropuncture kit and the modified Seldinger technique, access was gained to the right common femoral artery and an 8 French sheath was placed. Under fluoroscopy, a Zoom 88 guide catheter was navigated over a 6 [place] Berenstein 2 catheter and a 0.035" Terumo Glidewire into the aortic arch. Multiple attempts to gain access to the right common carotid artery proved unsuccessful. The Berenstein 2 catheter was then exchanged for a [facility] 2 catheter. The catheter tip was reformed at the aortic valve. Multiple attempts to gain access to the right common carotid artery proved unsuccessful. The catheter was subsequently withdrawn. The right arm was prepped and draped in the usual sterile fashion. Using the modified Seldinger technique and a micropuncture kit, access was gained to the right radial artery at the wrist and a 7 French sheath was placed. Real-time ultrasound guidance was utilized for vascular access including the acquisition of a permanent ultrasound image documenting patency of the accessed vessel. Then, a right radial artery angiogram was obtained via sheath side port. Next, a 5 [place] Simmons 2 glide catheter was navigated over a 0.035" Terumo Glidewire into the right subclavian artery under fluoroscopic guidance. The catheter tip was reformed at the aortic valve. The catheter was placed into the right common carotid artery. Multiple attempts to advance the wire into the right common carotid artery resulted in herniation of the catheter into the aortic arch. The right side of the neck was prepped and draped in the usual sterile fashion. Using the modified Seldinger technique and a micropuncture kit, access was gained to the right common carotid artery at the lower neck and a 6 French sheath was placed. Real-time ultrasound guidance was utilized for vascular access including the  acquisition of a permanent ultrasound image documenting patency of the accessed vessel. Back blood flow was obtained via sheath side port. Then, contrast injection performed resulted in retraction of the sheath with contrast extravasation into the neck soft tissues, along the right carotid sheath. The sheath was subsequently removed. Using the modified Seldinger technique and a micropuncture kit, access was gained to the distal right common carotid artery, below the level of the bifurcation and a 6 French sheath was placed. The distal sheath was at the distal cervical right ICA. Real-time ultrasound guidance was utilized for vascular access including the acquisition of a permanent ultrasound image documenting patency of the accessed vessel. Right internal carotid artery angiograms with frontal and lateral views of the head and neck were obtained. FINDINGS: 1. The right radial artery originates from the axillary artery and has normal caliber. 2. Increased tortuosity of the right  common carotid artery with mild atherosclerotic changes without significant stenosis. 3. Occlusion of the right A1/ACA with filling defect extending into the right carotid terminus resulting in severe stenosis. 4. Occlusion of a proximal right M2/MCA posterior division branch. PROCEDURE: Using biplane roadmap guidance, a Berenstein 2 catheter was navigated over a Terumo Glidewire into the proximal petrous segment of the right ICA. The guidewire was removed. Then, a phenom 21 microcatheter was then navigated over an Aristotle 14 microguidewire into the right M2/MCA posterior division branch. Next, a 4 x 40 mm solitaire stent retriever was deployed spanning the M1 and M2 segments. The device was allowed to intercalated with the clot for 4 minutes. The microcatheter was removed. The Berenstein 2 catheter was connected to an aspiration pump. The thrombectomy device was removed under constant aspiration. The Berenstein 2 catheter was aspirated for  debris. Right internal carotid artery angiograms with frontal lateral views of the head showed partial recanalization of the right M2/MCA with slow flow the distal branches and subocclusive clots at the proximal and distal M2. The Berenstein 2 catheter was exchanged over a Glidewire for a 5 [place] Sofia aspiration catheter. Using biplane roadmap guidance, a phenom 21 microcatheter was navigated over an Aristotle 14 microguidewire into the proximal right M3/MCA. Then, a 4 x 40 mm solitaire stent retriever was deployed spanning the M2 and M3 segments. The device was allowed to intercalated with the clot for 4 minutes. The aspiration catheter was advanced to the level of occlusion and connected to an aspiration pump. The thrombectomy device, microcatheter and aspiration catheter were retracted into the petrous right ICA under constant aspiration. The microcatheter and thrombectomy device were then removed. The aspiration catheter was manually aspirated retrieving residual clot. Right internal carotid artery angiograms with frontal and lateral views of the head show complete recanalization of the right MCA vascular tree, right A1/ACA and carotid terminus. Distal embolus to the right A3/ACA noted. Using biplane roadmap guidance, a phenom 21 microcatheter was navigated over an Aristotle 14 microguidewire into the proximal right A4/ACA. Then, a 3 mm solitaire stent retriever was deployed spanning the A2 and A3 segments. The device was allowed to intercalated with the clot for 4 minutes. The aspiration catheter was advanced to the distal A1/ACA segment and connected to an aspiration pump. The thrombectomy device, microcatheter and aspiration catheter were retracted into the petrous right ICA under constant aspiration. The microcatheter and thrombectomy device were then removed. The aspiration catheter was manually aspirated retrieving residual clot. Right internal carotid artery angiograms with frontal and lateral views of  the head showed complete recanalization the right ACA vascular tree (TICI 3). Flat panel CT of the head was obtained and post processed in a separate workstation with concurrent attending physician supervision. Selected images were sent to PACS. Trace right sylvian subarachnoid hemorrhage/contrast extravasation. The catheter was subsequently withdrawn. Right common carotid artery angiograms with frontal and lateral views of the head were obtained via sheath side port. The puncture is at the distal right common carotid artery. A small, non flow limiting dissection is noted at the distal cervical/cavernous segment, likely related to access. The sheath was exchanged over the wire for a 6 [place] Angio-Seal which was utilized for access closure. Immediate hemostasis was achieved. Right common femoral artery angiogram was obtained in right anterior oblique view. The puncture is at the level of the common femoral artery. The artery has atherosclerotic changes with moderate stenosis. The sheath was exchanged over the wire for an 8 [place] Angio-Seal which  was utilized for access closure. Immediate hemostasis was achieved. An inflatable band was placed and inflated over the right wrist access site. The vascular sheath was withdrawn and the band was slowly deflated until brisk flow was noted through the arteriotomy site. At this point, the band was reinflated with additional 4 cc of air to obtain patent hemostasis. IMPRESSION: Successful mechanical thrombectomy for treatment of an occlusion of a proximal right M2/MCA, proximal right A1/ACA and sub occlusion of the right ICA terminus achieving complete recanalization of both right MCA and ACA territories (TICI 3). PLAN: Patient remained intubated and transferred to ICU for continued monitoring. Electronically Signed   By: Baldemar Lenis M.D.   On: 12/30/2022 13:03   IR US Guide Vasc Access Right  Result Date: 12/30/2022 INDICATION: TATIANAH SUSTAITA is an 89  year-old female presenting with left-sided weakness, right gaze deviation and altered mental status; NIHSS 19. Her last known well was 11 p.m. on 12/28/2022. Her past medical history significant for GERD, prediabetes, permanent atrial fibrillation on eliquis, HTN, HLD, OSA; baseline modified Rankin scale 1. Head CT showed hypodensity of the right patella and basal ganglia (ASPECTS 7). No IV thrombolytic a vein as patient was outside the window. CT angiogram of the head and neck showed an occlusion of a proximal right M2/MCA and proximal right A1/ACA with the ACA clot extending into the ICA terminus which remains patent. EXAM: ULTRASOUND-GUIDED VASCULAR ACCESS (X2) DIAGNOSTIC CEREBRAL ANGIOGRAM MECHANICAL THROMBECTOMY FLAT PANEL HEAD CT COMPARISON:  CT/CT angiogram of the head and neck December 29, 2022. MEDICATIONS: No antibiotics utilized. ANESTHESIA/SEDATION: The procedure was performed under general anesthesia. CONTRAST:  100 mL of Omnipaque 300 milligram/mL FLUOROSCOPY: Radiation Exposure Index (as provided by the fluoroscopic device): 1,190 mGy Kerma COMPLICATIONS: None immediate. TECHNIQUE: Informed written consent was obtained from the patient's daughter after a thorough discussion of the procedural risks, benefits and alternatives. All questions were addressed. Maximal Sterile Barrier Technique was utilized including caps, mask, sterile gowns, sterile gloves, sterile drape, hand hygiene and skin antiseptic. A timeout was performed prior to the initiation of the procedure. The right groin was prepped and draped in the usual sterile fashion. Using a micropuncture kit and the modified Seldinger technique, access was gained to the right common femoral artery and an 8 French sheath was placed. Under fluoroscopy, a Zoom 88 guide catheter was navigated over a 6 [place] Berenstein 2 catheter and a 0.035" Terumo Glidewire into the aortic arch. Multiple attempts to gain access to the right common carotid artery proved  unsuccessful. The Berenstein 2 catheter was then exchanged for a [facility] 2 catheter. The catheter tip was reformed at the aortic valve. Multiple attempts to gain access to the right common carotid artery proved unsuccessful. The catheter was subsequently withdrawn. The right arm was prepped and draped in the usual sterile fashion. Using the modified Seldinger technique and a micropuncture kit, access was gained to the right radial artery at the wrist and a 7 French sheath was placed. Real-time ultrasound guidance was utilized for vascular access including the acquisition of a permanent ultrasound image documenting patency of the accessed vessel. Then, a right radial artery angiogram was obtained via sheath side port. Next, a 5 [place] Simmons 2 glide catheter was navigated over a 0.035" Terumo Glidewire into the right subclavian artery under fluoroscopic guidance. The catheter tip was reformed at the aortic valve. The catheter was placed into the right common carotid artery. Multiple attempts to advance the wire into the right  common carotid artery resulted in herniation of the catheter into the aortic arch. The right side of the neck was prepped and draped in the usual sterile fashion. Using the modified Seldinger technique and a micropuncture kit, access was gained to the right common carotid artery at the lower neck and a 6 French sheath was placed. Real-time ultrasound guidance was utilized for vascular access including the acquisition of a permanent ultrasound image documenting patency of the accessed vessel. Back blood flow was obtained via sheath side port. Then, contrast injection performed resulted in retraction of the sheath with contrast extravasation into the neck soft tissues, along the right carotid sheath. The sheath was subsequently removed. Using the modified Seldinger technique and a micropuncture kit, access was gained to the distal right common carotid artery, below the level of the bifurcation  and a 6 French sheath was placed. The distal sheath was at the distal cervical right ICA. Real-time ultrasound guidance was utilized for vascular access including the acquisition of a permanent ultrasound image documenting patency of the accessed vessel. Right internal carotid artery angiograms with frontal and lateral views of the head and neck were obtained. FINDINGS: 1. The right radial artery originates from the axillary artery and has normal caliber. 2. Increased tortuosity of the right common carotid artery with mild atherosclerotic changes without significant stenosis. 3. Occlusion of the right A1/ACA with filling defect extending into the right carotid terminus resulting in severe stenosis. 4. Occlusion of a proximal right M2/MCA posterior division branch. PROCEDURE: Using biplane roadmap guidance, a Berenstein 2 catheter was navigated over a Terumo Glidewire into the proximal petrous segment of the right ICA. The guidewire was removed. Then, a phenom 21 microcatheter was then navigated over an Aristotle 14 microguidewire into the right M2/MCA posterior division branch. Next, a 4 x 40 mm solitaire stent retriever was deployed spanning the M1 and M2 segments. The device was allowed to intercalated with the clot for 4 minutes. The microcatheter was removed. The Berenstein 2 catheter was connected to an aspiration pump. The thrombectomy device was removed under constant aspiration. The Berenstein 2 catheter was aspirated for debris. Right internal carotid artery angiograms with frontal lateral views of the head showed partial recanalization of the right M2/MCA with slow flow the distal branches and subocclusive clots at the proximal and distal M2. The Berenstein 2 catheter was exchanged over a Glidewire for a 5 [place] Sofia aspiration catheter. Using biplane roadmap guidance, a phenom 21 microcatheter was navigated over an Aristotle 14 microguidewire into the proximal right M3/MCA. Then, a 4 x 40 mm solitaire  stent retriever was deployed spanning the M2 and M3 segments. The device was allowed to intercalated with the clot for 4 minutes. The aspiration catheter was advanced to the level of occlusion and connected to an aspiration pump. The thrombectomy device, microcatheter and aspiration catheter were retracted into the petrous right ICA under constant aspiration. The microcatheter and thrombectomy device were then removed. The aspiration catheter was manually aspirated retrieving residual clot. Right internal carotid artery angiograms with frontal and lateral views of the head show complete recanalization of the right MCA vascular tree, right A1/ACA and carotid terminus. Distal embolus to the right A3/ACA noted. Using biplane roadmap guidance, a phenom 21 microcatheter was navigated over an Aristotle 14 microguidewire into the proximal right A4/ACA. Then, a 3 mm solitaire stent retriever was deployed spanning the A2 and A3 segments. The device was allowed to intercalated with the clot for 4 minutes. The aspiration catheter was  advanced to the distal A1/ACA segment and connected to an aspiration pump. The thrombectomy device, microcatheter and aspiration catheter were retracted into the petrous right ICA under constant aspiration. The microcatheter and thrombectomy device were then removed. The aspiration catheter was manually aspirated retrieving residual clot. Right internal carotid artery angiograms with frontal and lateral views of the head showed complete recanalization the right ACA vascular tree (TICI 3). Flat panel CT of the head was obtained and post processed in a separate workstation with concurrent attending physician supervision. Selected images were sent to PACS. Trace right sylvian subarachnoid hemorrhage/contrast extravasation. The catheter was subsequently withdrawn. Right common carotid artery angiograms with frontal and lateral views of the head were obtained via sheath side port. The puncture is at  the distal right common carotid artery. A small, non flow limiting dissection is noted at the distal cervical/cavernous segment, likely related to access. The sheath was exchanged over the wire for a 6 [place] Angio-Seal which was utilized for access closure. Immediate hemostasis was achieved. Right common femoral artery angiogram was obtained in right anterior oblique view. The puncture is at the level of the common femoral artery. The artery has atherosclerotic changes with moderate stenosis. The sheath was exchanged over the wire for an 8 [place] Angio-Seal which was utilized for access closure. Immediate hemostasis was achieved. An inflatable band was placed and inflated over the right wrist access site. The vascular sheath was withdrawn and the band was slowly deflated until brisk flow was noted through the arteriotomy site. At this point, the band was reinflated with additional 4 cc of air to obtain patent hemostasis. IMPRESSION: Successful mechanical thrombectomy for treatment of an occlusion of a proximal right M2/MCA, proximal right A1/ACA and sub occlusion of the right ICA terminus achieving complete recanalization of both right MCA and ACA territories (TICI 3). PLAN: Patient remained intubated and transferred to ICU for continued monitoring. Electronically Signed   By: Baldemar Lenis M.D.   On: 12/30/2022 13:03   IR US Guide Vasc Access Right  Result Date: 12/30/2022 INDICATION: Jocelyn [name] is an 89 year-old female presenting with left-sided weakness, right gaze deviation and altered mental status; NIHSS 19. Her last known well was 11 p.m. on 12/28/2022. Her past medical history significant for GERD, prediabetes, permanent atrial fibrillation on eliquis, HTN, HLD, OSA; baseline modified Rankin scale 1. Head CT showed hypodensity of the right patella and basal ganglia (ASPECTS 7). No IV thrombolytic a vein as patient was outside the window. CT angiogram of the head and neck showed an  occlusion of a proximal right M2/MCA and proximal right A1/ACA with the ACA clot extending into the ICA terminus which remains patent. EXAM: ULTRASOUND-GUIDED VASCULAR ACCESS (X2) DIAGNOSTIC CEREBRAL ANGIOGRAM MECHANICAL THROMBECTOMY FLAT PANEL HEAD CT COMPARISON:  CT/CT angiogram of the head and neck December 29, 2022. MEDICATIONS: No antibiotics utilized. ANESTHESIA/SEDATION: The procedure was performed under general anesthesia. CONTRAST:  100 mL of Omnipaque 300 milligram/mL FLUOROSCOPY: Radiation Exposure Index (as provided by the fluoroscopic device): 1,190 mGy Kerma COMPLICATIONS: None immediate. TECHNIQUE: Informed written consent was obtained from the patient's daughter after a thorough discussion of the procedural risks, benefits and alternatives. All questions were addressed. Maximal Sterile Barrier Technique was utilized including caps, mask, sterile gowns, sterile gloves, sterile drape, hand hygiene and skin antiseptic. A timeout was performed prior to the initiation of the procedure. The right groin was prepped and draped in the usual sterile fashion. Using a micropuncture kit and the modified Seldinger  technique, access was gained to the right common femoral artery and an 8 French sheath was placed. Under fluoroscopy, a Zoom 88 guide catheter was navigated over a 6 [place] Berenstein 2 catheter and a 0.035" Terumo Glidewire into the aortic arch. Multiple attempts to gain access to the right common carotid artery proved unsuccessful. The Berenstein 2 catheter was then exchanged for a [facility] 2 catheter. The catheter tip was reformed at the aortic valve. Multiple attempts to gain access to the right common carotid artery proved unsuccessful. The catheter was subsequently withdrawn. The right arm was prepped and draped in the usual sterile fashion. Using the modified Seldinger technique and a micropuncture kit, access was gained to the right radial artery at the wrist and a 7 French sheath was placed.  Real-time ultrasound guidance was utilized for vascular access including the acquisition of a permanent ultrasound image documenting patency of the accessed vessel. Then, a right radial artery angiogram was obtained via sheath side port. Next, a 5 [place] Simmons 2 glide catheter was navigated over a 0.035" Terumo Glidewire into the right subclavian artery under fluoroscopic guidance. The catheter tip was reformed at the aortic valve. The catheter was placed into the right common carotid artery. Multiple attempts to advance the wire into the right common carotid artery resulted in herniation of the catheter into the aortic arch. The right side of the neck was prepped and draped in the usual sterile fashion. Using the modified Seldinger technique and a micropuncture kit, access was gained to the right common carotid artery at the lower neck and a 6 French sheath was placed. Real-time ultrasound guidance was utilized for vascular access including the acquisition of a permanent ultrasound image documenting patency of the accessed vessel. Back blood flow was obtained via sheath side port. Then, contrast injection performed resulted in retraction of the sheath with contrast extravasation into the neck soft tissues, along the right carotid sheath. The sheath was subsequently removed. Using the modified Seldinger technique and a micropuncture kit, access was gained to the distal right common carotid artery, below the level of the bifurcation and a 6 French sheath was placed. The distal sheath was at the distal cervical right ICA. Real-time ultrasound guidance was utilized for vascular access including the acquisition of a permanent ultrasound image documenting patency of the accessed vessel. Right internal carotid artery angiograms with frontal and lateral views of the head and neck were obtained. FINDINGS: 1. The right radial artery originates from the axillary artery and has normal caliber. 2. Increased tortuosity of the  right common carotid artery with mild atherosclerotic changes without significant stenosis. 3. Occlusion of the right A1/ACA with filling defect extending into the right carotid terminus resulting in severe stenosis. 4. Occlusion of a proximal right M2/MCA posterior division branch. PROCEDURE: Using biplane roadmap guidance, a Berenstein 2 catheter was navigated over a Terumo Glidewire into the proximal petrous segment of the right ICA. The guidewire was removed. Then, a phenom 21 microcatheter was then navigated over an Aristotle 14 microguidewire into the right M2/MCA posterior division branch. Next, a 4 x 40 mm solitaire stent retriever was deployed spanning the M1 and M2 segments. The device was allowed to intercalated with the clot for 4 minutes. The microcatheter was removed. The Berenstein 2 catheter was connected to an aspiration pump. The thrombectomy device was removed under constant aspiration. The Berenstein 2 catheter was aspirated for debris. Right internal carotid artery angiograms with frontal lateral views of the head showed partial recanalization of  the right M2/MCA with slow flow the distal branches and subocclusive clots at the proximal and distal M2. The Berenstein 2 catheter was exchanged over a Glidewire for a 5 [place] Sofia aspiration catheter. Using biplane roadmap guidance, a phenom 21 microcatheter was navigated over an Aristotle 14 microguidewire into the proximal right M3/MCA. Then, a 4 x 40 mm solitaire stent retriever was deployed spanning the M2 and M3 segments. The device was allowed to intercalated with the clot for 4 minutes. The aspiration catheter was advanced to the level of occlusion and connected to an aspiration pump. The thrombectomy device, microcatheter and aspiration catheter were retracted into the petrous right ICA under constant aspiration. The microcatheter and thrombectomy device were then removed. The aspiration catheter was manually aspirated retrieving residual  clot. Right internal carotid artery angiograms with frontal and lateral views of the head show complete recanalization of the right MCA vascular tree, right A1/ACA and carotid terminus. Distal embolus to the right A3/ACA noted. Using biplane roadmap guidance, a phenom 21 microcatheter was navigated over an Aristotle 14 microguidewire into the proximal right A4/ACA. Then, a 3 mm solitaire stent retriever was deployed spanning the A2 and A3 segments. The device was allowed to intercalated with the clot for 4 minutes. The aspiration catheter was advanced to the distal A1/ACA segment and connected to an aspiration pump. The thrombectomy device, microcatheter and aspiration catheter were retracted into the petrous right ICA under constant aspiration. The microcatheter and thrombectomy device were then removed. The aspiration catheter was manually aspirated retrieving residual clot. Right internal carotid artery angiograms with frontal and lateral views of the head showed complete recanalization the right ACA vascular tree (TICI 3). Flat panel CT of the head was obtained and post processed in a separate workstation with concurrent attending physician supervision. Selected images were sent to PACS. Trace right sylvian subarachnoid hemorrhage/contrast extravasation. The catheter was subsequently withdrawn. Right common carotid artery angiograms with frontal and lateral views of the head were obtained via sheath side port. The puncture is at the distal right common carotid artery. A small, non flow limiting dissection is noted at the distal cervical/cavernous segment, likely related to access. The sheath was exchanged over the wire for a 6 [place] Angio-Seal which was utilized for access closure. Immediate hemostasis was achieved. Right common femoral artery angiogram was obtained in right anterior oblique view. The puncture is at the level of the common femoral artery. The artery has atherosclerotic changes with moderate  stenosis. The sheath was exchanged over the wire for an 8 [place] Angio-Seal which was utilized for access closure. Immediate hemostasis was achieved. An inflatable band was placed and inflated over the right wrist access site. The vascular sheath was withdrawn and the band was slowly deflated until brisk flow was noted through the arteriotomy site. At this point, the band was reinflated with additional 4 cc of air to obtain patent hemostasis. IMPRESSION: Successful mechanical thrombectomy for treatment of an occlusion of a proximal right M2/MCA, proximal right A1/ACA and sub occlusion of the right ICA terminus achieving complete recanalization of both right MCA and ACA territories (TICI 3). PLAN: Patient remained intubated and transferred to ICU for continued monitoring. Electronically Signed   By: Baldemar Lenis M.D.   On: 12/30/2022 13:03   IR CT Head Ltd  Result Date: 12/30/2022 INDICATION: Jocelyn [name] is an 89 year-old female presenting with left-sided weakness, right gaze deviation and altered mental status; NIHSS 19. Her last known well was 11 p.m.  on 12/28/2022. Her past medical history significant for GERD, prediabetes, permanent atrial fibrillation on eliquis, HTN, HLD, OSA; baseline modified Rankin scale 1. Head CT showed hypodensity of the right patella and basal ganglia (ASPECTS 7). No IV thrombolytic a vein as patient was outside the window. CT angiogram of the head and neck showed an occlusion of a proximal right M2/MCA and proximal right A1/ACA with the ACA clot extending into the ICA terminus which remains patent. EXAM: ULTRASOUND-GUIDED VASCULAR ACCESS (X2) DIAGNOSTIC CEREBRAL ANGIOGRAM MECHANICAL THROMBECTOMY FLAT PANEL HEAD CT COMPARISON:  CT/CT angiogram of the head and neck December 29, 2022. MEDICATIONS: No antibiotics utilized. ANESTHESIA/SEDATION: The procedure was performed under general anesthesia. CONTRAST:  100 mL of Omnipaque 300 milligram/mL FLUOROSCOPY:  Radiation Exposure Index (as provided by the fluoroscopic device): 1,190 mGy Kerma COMPLICATIONS: None immediate. TECHNIQUE: Informed written consent was obtained from the patient's daughter after a thorough discussion of the procedural risks, benefits and alternatives. All questions were addressed. Maximal Sterile Barrier Technique was utilized including caps, mask, sterile gowns, sterile gloves, sterile drape, hand hygiene and skin antiseptic. A timeout was performed prior to the initiation of the procedure. The right groin was prepped and draped in the usual sterile fashion. Using a micropuncture kit and the modified Seldinger technique, access was gained to the right common femoral artery and an 8 French sheath was placed. Under fluoroscopy, a Zoom 88 guide catheter was navigated over a 6 [place] Berenstein 2 catheter and a 0.035" Terumo Glidewire into the aortic arch. Multiple attempts to gain access to the right common carotid artery proved unsuccessful. The Berenstein 2 catheter was then exchanged for a [facility] 2 catheter. The catheter tip was reformed at the aortic valve. Multiple attempts to gain access to the right common carotid artery proved unsuccessful. The catheter was subsequently withdrawn. The right arm was prepped and draped in the usual sterile fashion. Using the modified Seldinger technique and a micropuncture kit, access was gained to the right radial artery at the wrist and a 7 French sheath was placed. Real-time ultrasound guidance was utilized for vascular access including the acquisition of a permanent ultrasound image documenting patency of the accessed vessel. Then, a right radial artery angiogram was obtained via sheath side port. Next, a 5 [place] Simmons 2 glide catheter was navigated over a 0.035" Terumo Glidewire into the right subclavian artery under fluoroscopic guidance. The catheter tip was reformed at the aortic valve. The catheter was placed into the right common carotid artery.  Multiple attempts to advance the wire into the right common carotid artery resulted in herniation of the catheter into the aortic arch. The right side of the neck was prepped and draped in the usual sterile fashion. Using the modified Seldinger technique and a micropuncture kit, access was gained to the right common carotid artery at the lower neck and a 6 French sheath was placed. Real-time ultrasound guidance was utilized for vascular access including the acquisition of a permanent ultrasound image documenting patency of the accessed vessel. Back blood flow was obtained via sheath side port. Then, contrast injection performed resulted in retraction of the sheath with contrast extravasation into the neck soft tissues, along the right carotid sheath. The sheath was subsequently removed. Using the modified Seldinger technique and a micropuncture kit, access was gained to the distal right common carotid artery, below the level of the bifurcation and a 6 French sheath was placed. The distal sheath was at the distal cervical right ICA. Real-time ultrasound guidance was utilized for  vascular access including the acquisition of a permanent ultrasound image documenting patency of the accessed vessel. Right internal carotid artery angiograms with frontal and lateral views of the head and neck were obtained. FINDINGS: 1. The right radial artery originates from the axillary artery and has normal caliber. 2. Increased tortuosity of the right common carotid artery with mild atherosclerotic changes without significant stenosis. 3. Occlusion of the right A1/ACA with filling defect extending into the right carotid terminus resulting in severe stenosis. 4. Occlusion of a proximal right M2/MCA posterior division branch. PROCEDURE: Using biplane roadmap guidance, a Berenstein 2 catheter was navigated over a Terumo Glidewire into the proximal petrous segment of the right ICA. The guidewire was removed. Then, a phenom 21 microcatheter  was then navigated over an Aristotle 14 microguidewire into the right M2/MCA posterior division branch. Next, a 4 x 40 mm solitaire stent retriever was deployed spanning the M1 and M2 segments. The device was allowed to intercalated with the clot for 4 minutes. The microcatheter was removed. The Berenstein 2 catheter was connected to an aspiration pump. The thrombectomy device was removed under constant aspiration. The Berenstein 2 catheter was aspirated for debris. Right internal carotid artery angiograms with frontal lateral views of the head showed partial recanalization of the right M2/MCA with slow flow the distal branches and subocclusive clots at the proximal and distal M2. The Berenstein 2 catheter was exchanged over a Glidewire for a 5 [place] Sofia aspiration catheter. Using biplane roadmap guidance, a phenom 21 microcatheter was navigated over an Aristotle 14 microguidewire into the proximal right M3/MCA. Then, a 4 x 40 mm solitaire stent retriever was deployed spanning the M2 and M3 segments. The device was allowed to intercalated with the clot for 4 minutes. The aspiration catheter was advanced to the level of occlusion and connected to an aspiration pump. The thrombectomy device, microcatheter and aspiration catheter were retracted into the petrous right ICA under constant aspiration. The microcatheter and thrombectomy device were then removed. The aspiration catheter was manually aspirated retrieving residual clot. Right internal carotid artery angiograms with frontal and lateral views of the head show complete recanalization of the right MCA vascular tree, right A1/ACA and carotid terminus. Distal embolus to the right A3/ACA noted. Using biplane roadmap guidance, a phenom 21 microcatheter was navigated over an Aristotle 14 microguidewire into the proximal right A4/ACA. Then, a 3 mm solitaire stent retriever was deployed spanning the A2 and A3 segments. The device was allowed to intercalated with the  clot for 4 minutes. The aspiration catheter was advanced to the distal A1/ACA segment and connected to an aspiration pump. The thrombectomy device, microcatheter and aspiration catheter were retracted into the petrous right ICA under constant aspiration. The microcatheter and thrombectomy device were then removed. The aspiration catheter was manually aspirated retrieving residual clot. Right internal carotid artery angiograms with frontal and lateral views of the head showed complete recanalization the right ACA vascular tree (TICI 3). Flat panel CT of the head was obtained and post processed in a separate workstation with concurrent attending physician supervision. Selected images were sent to PACS. Trace right sylvian subarachnoid hemorrhage/contrast extravasation. The catheter was subsequently withdrawn. Right common carotid artery angiograms with frontal and lateral views of the head were obtained via sheath side port. The puncture is at the distal right common carotid artery. A small, non flow limiting dissection is noted at the distal cervical/cavernous segment, likely related to access. The sheath was exchanged over the wire for a 6 [place] Angio-Seal  which was utilized for access closure. Immediate hemostasis was achieved. Right common femoral artery angiogram was obtained in right anterior oblique view. The puncture is at the level of the common femoral artery. The artery has atherosclerotic changes with moderate stenosis. The sheath was exchanged over the wire for an 8 [place] Angio-Seal which was utilized for access closure. Immediate hemostasis was achieved. An inflatable band was placed and inflated over the right wrist access site. The vascular sheath was withdrawn and the band was slowly deflated until brisk flow was noted through the arteriotomy site. At this point, the band was reinflated with additional 4 cc of air to obtain patent hemostasis. IMPRESSION: Successful mechanical thrombectomy for  treatment of an occlusion of a proximal right M2/MCA, proximal right A1/ACA and sub occlusion of the right ICA terminus achieving complete recanalization of both right MCA and ACA territories (TICI 3). PLAN: Patient remained intubated and transferred to ICU for continued monitoring. Electronically Signed   By: Baldemar Lenis M.D.   On: 12/30/2022 13:03   ECHOCARDIOGRAM COMPLETE  Result Date: 12/30/2022    ECHOCARDIOGRAM REPORT   Patient Name:   Jocelyn [name] Date of Exam: 12/30/2022 Medical Rec #:  [id number]        Height:       63.0 in Accession #:    [id number]       Weight:       151.2 lb Date of Birth:  12-Feb-1931        BSA:          1.717 m Patient Age:    89 years         BP:           129/57 mmHg Patient Gender: F                HR:           63 bpm. Exam Location:  Inpatient Procedure: 2D Echo, Cardiac Doppler, Color Doppler and Saline Contrast Bubble            Study Indications:    Stroke  History:        Patient has prior history of Echocardiogram examinations, most                 recent 09/05/2022. CAD, Stroke, Arrythmias:Atrial Fibrillation,                 Signs/Symptoms:Dyspnea and Chest Pain; Risk Factors:Hypertension                 and Dyslipidemia.  Sonographer:    Vern Claude Referring Phys: [id number] CORTNEY E DE LA TORRE IMPRESSIONS  1. Left ventricular ejection fraction, by estimation, is 60 to 65%. The left ventricle has normal function. The left ventricle has no regional wall motion abnormalities. There is severe concentric left ventricular hypertrophy, would consider cardiac amyloidosis. Left ventricular diastolic parameters are indeterminate.  2. Right ventricular systolic function is mildly reduced. The right ventricular size is mildly enlarged. There is mildly elevated pulmonary artery systolic pressure. The estimated right ventricular systolic pressure is 40.6 mmHg.  3. Left atrial size was severely dilated.  4. Right atrial size was mildly dilated.  5. The mitral  valve is degenerative. Trivial mitral valve regurgitation. Mild mitral stenosis. The mean mitral valve gradient is 4.0 mmHg. Moderate to severe mitral annular calcification.  6. The aortic valve is tricuspid. There is moderate calcification of the aortic valve. Aortic valve regurgitation is not visualized. Aortic  valve sclerosis/calcification is present, without any evidence of aortic stenosis.  7. The inferior vena cava is dilated in size with <50% respiratory variability, suggesting right atrial pressure of 15 mmHg.  8. Negative bubble study, no evidence for PFO/ASD.  9. The patient appears to be in atrial fibrillation. FINDINGS  Left Ventricle: Left ventricular ejection fraction, by estimation, is 60 to 65%. The left ventricle has normal function. The left ventricle has no regional wall motion abnormalities. The left ventricular internal cavity size was normal in size. There is  severe concentric left ventricular hypertrophy. Left ventricular diastolic parameters are indeterminate. Right Ventricle: The right ventricular size is mildly enlarged. No increase in right ventricular wall thickness. Right ventricular systolic function is mildly reduced. There is mildly elevated pulmonary artery systolic pressure. The tricuspid regurgitant  velocity is 2.53 m/s, and with an assumed right atrial pressure of 15 mmHg, the estimated right ventricular systolic pressure is 40.6 mmHg. Left Atrium: Left atrial size was severely dilated. Right Atrium: Right atrial size was mildly dilated. Pericardium: Trivial pericardial effusion is present. Mitral Valve: The mitral valve is degenerative in appearance. There is moderate calcification of the mitral valve leaflet(s). Moderate to severe mitral annular calcification. Trivial mitral valve regurgitation. Mild mitral valve stenosis. MV peak gradient, 7.8 mmHg. The mean mitral valve gradient is 4.0 mmHg. Tricuspid Valve: The tricuspid valve is normal in structure. Tricuspid valve  regurgitation is mild. Aortic Valve: The aortic valve is tricuspid. There is moderate calcification of the aortic valve. Aortic valve regurgitation is not visualized. Aortic valve sclerosis/calcification is present, without any evidence of aortic stenosis. Aortic valve mean gradient measures 3.7 mmHg. Aortic valve peak gradient measures 8.6 mmHg. Aortic valve area, by VTI measures 2.67 cm. Pulmonic Valve: The pulmonic valve was normal in structure. Pulmonic valve regurgitation is trivial. Aorta: The aortic root is normal in size and structure. Venous: The inferior vena cava is dilated in size with less than 50% respiratory variability, suggesting right atrial pressure of 15 mmHg. IAS/Shunts: Negative bubble study, no evidence for PFO/ASD. Agitated saline contrast was given intravenously to evaluate for intracardiac shunting.  LEFT VENTRICLE PLAX 2D LVIDd:         4.20 cm     Diastology LVIDs:         2.70 cm     LV e' lateral:   5.50 cm/s LV PW:         1.20 cm     LV E/e' lateral: 22.0 LV IVS:        1.40 cm LVOT diam:     1.70 cm LV SV:         67 LV SV Index:   39 LVOT Area:     2.27 cm  LV Volumes (MOD) LV vol d, MOD A2C: 59.7 ml LV vol d, MOD A4C: 78.1 ml LV vol s, MOD A2C: 23.1 ml LV vol s, MOD A4C: 17.9 ml LV SV MOD A2C:     36.6 ml LV SV MOD A4C:     78.1 ml LV SV MOD BP:      48.9 ml RIGHT VENTRICLE            IVC RV Basal diam:  4.30 cm    IVC diam: 2.30 cm RV Mid diam:    2.50 cm RV S prime:     7.15 cm/s TAPSE (M-mode): 1.3 cm LEFT ATRIUM              Index  RIGHT ATRIUM           Index LA diam:        3.40 cm  1.98 cm/m   RA Area:     20.90 cm LA Vol (A2C):   88.2 ml  51.37 ml/m  RA Volume:   71.80 ml  41.82 ml/m LA Vol (A4C):   76.7 ml  44.67 ml/m LA Biplane Vol: 102.0 ml 59.40 ml/m  AORTIC VALVE                    PULMONIC VALVE AV Area (Vmax):    2.24 cm     PV Vmax:          0.85 m/s AV Area (Vmean):   2.32 cm     PV Peak grad:     2.9 mmHg AV Area (VTI):     2.67 cm     PR End  Diast Vel: 3.93 msec AV Vmax:           146.67 cm/s AV Vmean:          82.133 cm/s AV VTI:            0.253 m AV Peak Grad:      8.6 mmHg AV Mean Grad:      3.7 mmHg LVOT Vmax:         145.00 cm/s LVOT Vmean:        83.800 cm/s LVOT VTI:          0.297 m LVOT/AV VTI ratio: 1.18  AORTA Ao Root diam: 2.80 cm Ao Asc diam:  3.20 cm MITRAL VALVE                TRICUSPID VALVE MV Area (PHT): 4.21 cm     TR Peak grad:   25.6 mmHg MV Area VTI:   2.15 cm     TR Vmax:        253.00 cm/s MV Peak grad:  7.8 mmHg MV Mean grad:  4.0 mmHg     SHUNTS MV Vmax:       1.40 m/s     Systemic VTI:  0.30 m MV Vmean:      71.2 cm/s    Systemic Diam: 1.70 cm MV Decel Time: 180 msec MV E velocity: 121.00 cm/s Dalton McleanMD Electronically signed by Wilfred Lacy Signature Date/Time: 12/30/2022/9:06:09 AM    Final    MR BRAIN WO CONTRAST  Result Date: 12/30/2022 CLINICAL DATA:  Follow-up examination for stroke. EXAM: MRI HEAD WITHOUT CONTRAST MRA HEAD WITHOUT CONTRAST TECHNIQUE: Multiplanar, multi-echo pulse sequences of the brain and surrounding structures were acquired without intravenous contrast. Angiographic images of the Circle of Willis were acquired using MRA technique without intravenous contrast. COMPARISON:  Prior studies from 12/29/2022. FINDINGS: MRI HEAD FINDINGS Brain: Age-related cerebral volume loss. Patchy T2/FLAIR hyperintensity involving the periventricular and deep white matter both cerebral hemispheres, consistent with chronic small vessel ischemic disease, mild to moderate in nature. Mild patchy involvement of the pons. Scattered restricted diffusion involving the right cerebral hemisphere, consistent with acute right MCA distribution infarcts. Involvement of the right caudate and lentiform nuclei. Additional patchy involvement of the right insula and overlying right frontal, parietal, and temporal lobes. Blood products associated small volume petechial blood products at the posterior right frontal region  (series 8, image 34) Heidelberg classification 1a: HI1, scattered small petechiae, no mass effect. No frank hemorrhagic transformation or significant regional mass effect. Gray-white matter differentiation otherwise maintained. No other acute  or chronic intracranial blood products. No mass lesion or midline shift. No hydrocephalus or extra-axial fluid collection. Pituitary gland and suprasellar region within normal limits. Vascular: Major intracranial vascular flow voids are maintained. Skull and upper cervical spine: Craniocervical junction normal. Bone marrow signal intensity within normal limits. No scalp soft tissue abnormality. Sinuses/Orbits: Prior bilateral ocular lens replacement. Scattered mucosal thickening noted about the paranasal sinuses. No air-fluid levels. No significant mastoid effusion. Other: None. MRA HEAD FINDINGS Anterior circulation: Atheromatous irregularity about the carotid siphons without hemodynamically significant stenosis. 3-4 mm outpouching extending inferiorly from the supraclinoid left ICA, suspicious for a left PCOM aneurysm (series 10, image 103). There has been interval revascularization of previously seen clot at the right ICA terminus. A1 segments patent bilaterally. Left A1 hypoplastic. Normal anterior communicating artery complex. Anterior cerebral arteries patent without significant stenosis. M1 segments patent bilaterally. Interval revascularization of previously seen right M2 occlusion. Distal MCA branches now well perfused and fairly symmetric. Posterior circulation: Both V4 segments patent without stenosis. Right vertebral artery dominant. Neither PICA origin well visualized. Basilar patent without stenosis. Superior cerebral arteries patent bilaterally. Left PCA widely patent to its distal aspect without stenosis. Focal moderate distal right P2 stenosis noted (series 1062, image 14). Right PCA otherwise patent as well. Anatomic variants: As above.  No aneurysm.  IMPRESSION: MRI HEAD: 1. Scattered acute right MCA distribution infarcts involving the right caudate and lentiform nuclei, right insula, and overlying right cerebral hemisphere. Associated small volume petechial hemorrhage at the posterior right frontal region, Heidelberg classification 1a: HI1, scattered small petechiae, no mass effect. 2. Underlying age-related cerebral volume loss with mild to moderate chronic microvascular ischemic disease. MRA HEAD: 1. Interval revascularization of previously seen clot at the right ICA terminus and right M2 segment. No residual or recurrent large vessel occlusion. 2. Focal moderate distal right P2 stenosis. No other hemodynamically significant or correctable stenosis about the intracranial arterial circulation. 3. 3-4 mm outpouching arising from the supraclinoid left ICA, suspicious for a left PCOM aneurysm. Electronically Signed   By: Rise Mu M.D.   On: 12/30/2022 02:58   MR ANGIO HEAD WO CONTRAST  Result Date: 12/30/2022 CLINICAL DATA:  Follow-up examination for stroke. EXAM: MRI HEAD WITHOUT CONTRAST MRA HEAD WITHOUT CONTRAST TECHNIQUE: Multiplanar, multi-echo pulse sequences of the brain and surrounding structures were acquired without intravenous contrast. Angiographic images of the Circle of Willis were acquired using MRA technique without intravenous contrast. COMPARISON:  Prior studies from 12/29/2022. FINDINGS: MRI HEAD FINDINGS Brain: Age-related cerebral volume loss. Patchy T2/FLAIR hyperintensity involving the periventricular and deep white matter both cerebral hemispheres, consistent with chronic small vessel ischemic disease, mild to moderate in nature. Mild patchy involvement of the pons. Scattered restricted diffusion involving the right cerebral hemisphere, consistent with acute right MCA distribution infarcts. Involvement of the right caudate and lentiform nuclei. Additional patchy involvement of the right insula and overlying right frontal,  parietal, and temporal lobes. Blood products associated small volume petechial blood products at the posterior right frontal region (series 8, image 34) Heidelberg classification 1a: HI1, scattered small petechiae, no mass effect. No frank hemorrhagic transformation or significant regional mass effect. Gray-white matter differentiation otherwise maintained. No other acute or chronic intracranial blood products. No mass lesion or midline shift. No hydrocephalus or extra-axial fluid collection. Pituitary gland and suprasellar region within normal limits. Vascular: Major intracranial vascular flow voids are maintained. Skull and upper cervical spine: Craniocervical junction normal. Bone marrow signal intensity within normal limits. No scalp soft tissue abnormality.  Sinuses/Orbits: Prior bilateral ocular lens replacement. Scattered mucosal thickening noted about the paranasal sinuses. No air-fluid levels. No significant mastoid effusion. Other: None. MRA HEAD FINDINGS Anterior circulation: Atheromatous irregularity about the carotid siphons without hemodynamically significant stenosis. 3-4 mm outpouching extending inferiorly from the supraclinoid left ICA, suspicious for a left PCOM aneurysm (series 10, image 103). There has been interval revascularization of previously seen clot at the right ICA terminus. A1 segments patent bilaterally. Left A1 hypoplastic. Normal anterior communicating artery complex. Anterior cerebral arteries patent without significant stenosis. M1 segments patent bilaterally. Interval revascularization of previously seen right M2 occlusion. Distal MCA branches now well perfused and fairly symmetric. Posterior circulation: Both V4 segments patent without stenosis. Right vertebral artery dominant. Neither PICA origin well visualized. Basilar patent without stenosis. Superior cerebral arteries patent bilaterally. Left PCA widely patent to its distal aspect without stenosis. Focal moderate distal right  P2 stenosis noted (series 1062, image 14). Right PCA otherwise patent as well. Anatomic variants: As above.  No aneurysm. IMPRESSION: MRI HEAD: 1. Scattered acute right MCA distribution infarcts involving the right caudate and lentiform nuclei, right insula, and overlying right cerebral hemisphere. Associated small volume petechial hemorrhage at the posterior right frontal region, Heidelberg classification 1a: HI1, scattered small petechiae, no mass effect. 2. Underlying age-related cerebral volume loss with mild to moderate chronic microvascular ischemic disease. MRA HEAD: 1. Interval revascularization of previously seen clot at the right ICA terminus and right M2 segment. No residual or recurrent large vessel occlusion. 2. Focal moderate distal right P2 stenosis. No other hemodynamically significant or correctable stenosis about the intracranial arterial circulation. 3. 3-4 mm outpouching arising from the supraclinoid left ICA, suspicious for a left PCOM aneurysm. Electronically Signed   By: Rise Mu M.D.   On: 12/30/2022 02:58   DG Abd Portable 1V  Result Date: 12/29/2022 CLINICAL DATA:  Orogastric tube placement EXAM: PORTABLE ABDOMEN - 1 VIEW limited for tube placement COMPARISON:  X-ray 12/29/2022 earlier. FINDINGS: Limited view of the upper abdomen and lower chest demonstrates enteric tube with the tip extending along the fundus of the stomach projected superiorly. Minimal bowel gas elsewhere along the visualized upper abdomen. Curvature and moderate degenerative changes of the spine. IMPRESSION: Limited x-ray for tube placement has a enteric tube extending towards the fundus of the stomach Electronically Signed   By: Karen Kays M.D.   On: 12/29/2022 17:11   DG Abd Portable 1V  Result Date: 12/29/2022 CLINICAL DATA:  [id number] Encounter for orogastric (OG) tube placement [id number] EXAM: PORTABLE ABDOMEN - 1 VIEW COMPARISON:  CT abdomen pelvis 03/01/2018 FINDINGS: Enteric tube courses below  the hemidiaphragm and then makes a hairpin turn with a course back cranially with tip overlying the retrocardiac region. Query finding due to patient positioning versus malpositioning of the tube versus hiatal hernia. The bowel gas pattern is normal. Previously administered intravenous contrast noted excreted by bilateral collecting systems. No radio-opaque calculi or other significant radiographic abnormality are seen. Severe atherosclerotic plaque and mitral annular calcifications. Please see chest x-ray for further details regarding the visualized cardiomediastinal silhouette and lungs. Severe degenerative changes of the lumbar spine with associated levoscoliosis centered at the mid lumbar spine. IMPRESSION: 1. Enteric tube courses below the hemidiaphragm and then makes a hairpin turn with a course back cranially with tip overlying the retrocardiac region. Query appropriate position with finding due to patient positioning versus malpositioning of the tube versus hiatal hernia. Recommend retraction by 7 cm and repeating x-ray abdomen for further evaluation. 2.  Nonobstructive bowel gas pattern. 3.  Aortic Atherosclerosis (ICD10-I70.0). Electronically Signed   By: Tish Frederickson M.D.   On: 12/29/2022 16:19   DG CHEST PORT 1 VIEW  Result Date: 12/29/2022 CLINICAL DATA:  Orogastric tube placement. Gastroesophageal reflux, patient was found unresponsive, right MCA thrombosis and stroke EXAM: PORTABLE CHEST 1 VIEW COMPARISON:  03/01/2022 FINDINGS: Endotracheal tube tip 3.8 cm above the carina. An orogastric tube extends into the stomach and appears to coil back with its tip near the gastric cardia. Dense mitral valve calcification. Mild cardiomegaly. Reverse lordotic projection. Atherosclerotic calcification of the aortic arch. No blunting of the costophrenic angles. IMPRESSION: 1. Endotracheal tube tip 3.8 cm above the carina. 2. Orogastric tube extends into the stomach and appears to coil back with its tip near  the gastric cardia. 3. Mild cardiomegaly. 4. Dense mitral valve calcification. Electronically Signed   By: Gaylyn Rong M.D.   On: 12/29/2022 16:18   CT ANGIO HEAD NECK W WO CM W PERF (CODE STROKE)  Result Date: 12/29/2022 CLINICAL DATA:  Right-sided gaze and aphasia with left-sided weakness. EXAM: CT ANGIOGRAPHY HEAD AND NECK CT PERFUSION BRAIN TECHNIQUE: Multidetector CT imaging of the head and neck was performed using the standard protocol during bolus administration of intravenous contrast. Multiplanar CT image reconstructions and MIPs were obtained to evaluate the vascular anatomy. Carotid stenosis measurements (when applicable) are obtained utilizing NASCET criteria, using the distal internal carotid diameter as the denominator. Multiphase CT imaging of the brain was performed following IV bolus contrast injection. Subsequent parametric perfusion maps were calculated using RAPID software. RADIATION DOSE REDUCTION: This exam was performed according to the departmental dose-optimization program which includes automated exposure control, adjustment of the mA and/or kV according to patient size and/or use of iterative reconstruction technique. CONTRAST:  OMNIPAQUE IOHEXOL 350 MG/ML SOLN COMPARISON:  Head CT from earlier today FINDINGS: CTA NECK FINDINGS Aortic arch: Atheromatous plaque with 3 vessel branching Right carotid system: Calcified plaque at the bifurcation. No flow reducing stenosis. No ulceration or beading. Left carotid system: Moderate calcified plaque centered at the bifurcation without stenosis, ulceration, or beading. Vertebral arteries: Proximal subclavian atherosclerosis. Bulky calcified plaque causes high-grade narrowing at the right vertebral origin. There is calcified plaque at the left vertebral origin which could cause moderate stenosis, quantification limited by the degree of calcified plaque blooming. Skeleton: No acute or aggressive finding. Other neck: No acute finding  Upper chest: No acute finding Review of the MIP images confirms the above findings CTA HEAD FINDINGS Anterior circulation: Luminal clot within the right ICA terminus, nearly obstructive to the MCA and completely obstructive to the right A1 segment. There is a subsequent clot with occlusion at the proximal right M2 level. Atheromatous calcification of the cavernous carotids. No contralateral branch occlusion, beading, or aneurysm. Atheromatous irregularity of intracranial branches to a moderate degree. Posterior circulation: Right dominant vertebral artery. The vertebral and basilar arteries are smoothly contoured and diffusely patent. No branch occlusion, beading, or aneurysm Venous sinuses: Negative Review of the MIP images confirms the above findings CT Brain Perfusion Findings: ASPECTS: 7 CBF (<30%) Volume: 6mL Perfusion (Tmax>6.0s) volume: Mismatch Volume: Infarction Location:Right frontal parietal white matter. Critical Value/emergent results were called by telephone at the time of interpretation on 12/29/2022 at 11:39 am to provider CORTNEY DE LA TORRE , who verbally acknowledged these results. IMPRESSION: CTA: 1. Emergent large vessel occlusion. Acute clot at the right ICA bifurcation and obstructing clot separately at proximal right M2 level. 2. No  flow reducing stenosis or embolic source seen in the more proximal vasculature. 3. Bilateral vertebral origin stenosis, advanced on the right and at least moderate on the left. CT perfusion: 150 cc area of ischemia in the right MCA territory. Underestimated core infarct when compared to aspects. Electronically Signed   By: Tiburcio Pea M.D.   On: 12/29/2022 11:48   CT HEAD CODE STROKE WO CONTRAST  Result Date: 12/29/2022 CLINICAL DATA:  Code stroke.  Left-sided weakness. EXAM: CT HEAD WITHOUT CONTRAST TECHNIQUE: Contiguous axial images were obtained from the base of the skull through the vertex without intravenous contrast. RADIATION DOSE  REDUCTION: This exam was performed according to the departmental dose-optimization program which includes automated exposure control, adjustment of the mA and/or kV according to patient size and/or use of iterative reconstruction technique. COMPARISON:  Head CT 04/13/2011 FINDINGS: Brain: Cytotoxic edema appearance in the right insular cortex and striatum. No acute hemorrhage, hydrocephalus, or masslike finding. Cerebral volume loss and chronic small vessel ischemia in keeping with age. Vascular: Hyperdense right MCA. Skull: Normal. Negative for fracture or focal lesion. Sinuses/Orbits: No acute finding Other: Critical Value/emergent results were called by telephone at the time of interpretation on 12/29/2022 at 11:38 am to provider Khaliqidina, who verbally acknowledged these results. ASPECTS [facility] Stroke Program Early CT Score) - Ganglionic level infarction (caudate, lentiform nuclei, internal capsule, insula, M1-M3 cortex): 4 - Supraganglionic infarction (M4-M6 cortex): 3 Total score (0-10 with 10 being normal): 7 IMPRESSION: 1. Cytotoxic edema in the right stratum and insula.ASPECTS is 7 2. Hyperdense right MCA. 3. No intracranial hemorrhage. Electronically Signed   By: Tiburcio Pea M.D.   On: 12/29/2022 11:39       HISTORY OF PRESENT ILLNESS 89 y.o. patient with history of GERD, prediabetes, A-fib on Eliquis, hypertension, hyperlipidemia and sleep apnea on CPAP was admitted with left-sided weakness and confusion.  HOSPITAL COURSE Patient was found to have a right MCA M2 occlusion and was taken to IR for mechanical thrombectomy, which was successful.  She was found to have scattered acute right MCA distribution infarcts on MRI.  Her Eliquis was restarted on 12/10 for her chronic A-fib.  There was some concern for aspiration pneumonia, and patient completed a 7-day course of ceftriaxone with improvement in chest x-ray and leukocytosis.  She was initially unable to pass her swallow study, and PEG tube  was inserted on 12/9 with tube feeding started, although patient did pass swallow study afterwards and is beginning to take foods by mouth.  She did have some pain in her left knee, which was successfully treated with lidocaine patches.  Acute Ischemic Infarct:  right MCA territory infarct with R M2 and A1 occlusion s/p IR with TICI 3 revascularization, etiology: Cardioembolic in the setting of A-fib Code Stroke  CT head  -hyperdense right MCA, cytotoxic edema in right stratum and insula.  ASPECTS 7   CTA head & neck acute clot at the right terminal ICA and acute obstructing right MCA M2 and ACA A1. CT perfusion 6/149 in the right MCA territory.  S/p IR via right ICA direct access with TICI3 MRI scattered acute right MCA distribution infarcts involving the right caudate and lentiform nuclei, right insula, and overlying right cerebral hemisphere, small volume petechial hemorrhage at the posterior right frontal region MRA  with interval revascularization of previously seen clot at the right ICA terminus and right M2 segment, Focal moderate distal right P2 stenosis. 2D Echo EF 60 to 65%.  LV severe concentric hypertrophy.  LA severely dilated LDL 33 HgbA1c 6.4 VTE prophylaxis - Heparin IV Eliquis prior to admission, eliquis re-started 12/10 after PEG Therapy recommendations: SNF Deposition - SNF today   Atrial fibrillation Home Meds: Eliquis On tele Rate controlled Resumed Eliquis   ? Cerebral aneurysm MRA head 3-4 mm outpouching arising from the supraclinoid left ICA, suspicious for a left PCOM aneurysm CTA head did not report aneurysm Yearly MRA follow up as outpt   Hypertensive Emergency Home meds: Imdur 120 mg, olmesartan-amlodipine-HCTZ 40-10-12.5 mg, spironolactone 25 mg On Avapro 150 mg daily, spironolactone 12.5 Add amlodipine 10 and hydrochlorothiazide 12.5 BP goal: SBP less than 180 Long-term BP goal normotensive   Hyperlipidemia Home meds: Lipitor 40 mg, resumed in  hospital LDL 33, goal < 70 Continue statin at discharge   Acute respiratory failure post procedure Hypoxia  Concern for Aspiration Pneumonia  Extubated on 11/29 CXR 11/30: left lung opacification, likely mucous plug. 40mg  Lasix x 1 on 11/30  Completed 7-day course of ceftriaxone   Dysphagia Patient has post-stroke dysphagia, SLP consulted Continue NPO PEG Tube 12/9 Continue tube feedings @ 55   Other Stroke Risk Factors  Obstructive sleep apnea,  on CPAP at home  Advanced age   Other acute issues Left knee pain - lidocaine patch - f/u with ortho as outpt and in SNF Leukocytosis WBC 9.3-- 11.1 -> 19.3 -> 17.6-> 17.5 CXR  Significant improvement in the previously seen left lung opacities. Small left lung base density and possible trace pleural effusion. Faint areas of increased density in the right upper lobe also concerning for infiltrate.  DISCHARGE EXAM  PHYSICAL EXAM General: Chronically ill-appearing elderly patient in no acute distress Psych:  Mood and affect appropriate for situation CV: Regular rate and rhythm on monitor Respiratory:  Regular, unlabored respirations on room air GI: Abdomen soft and nontender  NEURO:  Mental Status: Alert and oriented to person and place, gets month right but year wrong and unable to discuss situation Speech/Language: speech is in single words and short phrases without dysarthria  Cranial Nerves:  II: PERRL.  III, IV, VI: EOMI. Eyelids elevate symmetrically.  V: Sensation is intact to light touch and symmetrical to face.  VII: Smile is symmetrical.  VIII: hearing intact to voice. IX, X: Phonation is normal.  ZO:XWRUEAVW shrug stronger on the right XII: tongue is midline without fasciculations. Motor: Able to move right upper extremity with full antigravity strength, 1/5 movement of left upper extremity, no movement of left lower extremity, able to move right lower extremity but does not lift it off the bed Tone: is normal and  bulk is normal Sensation- Intact to light touch bilaterally.  Coordination: FTN intact on the right, unable to perform on the left.No drift.  Gait- deferred  Last NIHSS 15   Discharge Diet       Diet   DIET - DYS 1 Room service appropriate? Yes; Fluid consistency: Thin   liquids  DISCHARGE PLAN Disposition: Skilled nursing facility Eliquis (apixaban) daily for secondary stroke prevention indefinitely Ongoing stroke risk factor control by Primary Care Physician at time of discharge Follow-up PCP Gorden Harms, PA-C in 2 weeks. Follow up with orthopedics for bilateral knee pain Follow-up in [facility] in 8 weeks, office to schedule an appointment.   35 minutes were spent preparing discharge.  Cortney E Ernestina Columbia , MSN, AGACNP-BC [facility] See Amion for schedule and pager information 01/11/2023 1:31 PM  ATTENDING NOTE: I reviewed above note and agree  with the assessment and plan. Pt was seen and examined.   No acute event overnight. Pt did complaining of left knee pain. Per daughter, she is supposed to have left knee injection with her orthopedics but now in hospital. Gave lidocaine patches and will need follow up with ortho when she is in SNF. Medically ready for SNF placement. Continue eliquis and statin as well as TF via PEG.   For detailed assessment and plan, please refer to above/below as I have made changes wherever appropriate.   Marvel Plan, MD PhD Stroke Neurology 01/11/2023 8:23 PM

## 2023-01-11 NOTE — Plan of Care (Signed)
  Problem: Education: Goal: Knowledge of disease or condition will improve Outcome: Progressing Goal: Knowledge of secondary prevention will improve (MUST DOCUMENT ALL) Outcome: Progressing Goal: Knowledge of patient specific risk factors will improve Loraine Leriche N/A or DELETE if not current risk factor) Outcome: Progressing   Problem: Ischemic Stroke/TIA Tissue Perfusion: Goal: Complications of ischemic stroke/TIA will be minimized Outcome: Progressing   Problem: Coping: Goal: Will verbalize positive feelings about self Outcome: Progressing Goal: Will identify appropriate support needs Outcome: Progressing   Problem: Health Behavior/Discharge Planning: Goal: Ability to manage health-related needs will improve Outcome: Progressing Goal: Goals will be collaboratively established with patient/family Outcome: Progressing   Problem: Self-Care: Goal: Ability to participate in self-care as condition permits will improve Outcome: Progressing Goal: Verbalization of feelings and concerns over difficulty with self-care will improve Outcome: Progressing Goal: Ability to communicate needs accurately will improve Outcome: Progressing   Problem: Nutrition: Goal: Risk of aspiration will decrease Outcome: Progressing Goal: Dietary intake will improve Outcome: Progressing   Problem: Education: Goal: Understanding of CV disease, CV risk reduction, and recovery process will improve Outcome: Progressing Goal: Individualized Educational Video(s) Outcome: Progressing   Problem: Activity: Goal: Ability to return to baseline activity level will improve Outcome: Progressing   Problem: Cardiovascular: Goal: Ability to achieve and maintain adequate cardiovascular perfusion will improve Outcome: Progressing Goal: Vascular access site(s) Level 0-1 will be maintained Outcome: Progressing

## 2023-01-13 ENCOUNTER — Encounter: Payer: Self-pay | Admitting: Emergency Medicine

## 2023-01-13 ENCOUNTER — Other Ambulatory Visit: Payer: Self-pay

## 2023-01-13 ENCOUNTER — Encounter (HOSPITAL_COMMUNITY): Payer: Self-pay

## 2023-01-13 ENCOUNTER — Emergency Department: Payer: 59

## 2023-01-13 ENCOUNTER — Emergency Department
Admission: EM | Admit: 2023-01-13 | Discharge: 2023-01-14 | Disposition: A | Discharge status: Other Acute Inpatient Hospital | Payer: 59 | Attending: Emergency Medicine | Admitting: Emergency Medicine

## 2023-01-13 DIAGNOSIS — I619 Nontraumatic intracerebral hemorrhage, unspecified: Secondary | ICD-10-CM | POA: Insufficient documentation

## 2023-01-13 DIAGNOSIS — R29810 Facial weakness: Secondary | ICD-10-CM

## 2023-01-13 DIAGNOSIS — I63511 Cerebral infarction due to unspecified occlusion or stenosis of right middle cerebral artery: Secondary | ICD-10-CM | POA: Insufficient documentation

## 2023-01-13 DIAGNOSIS — R531 Weakness: Secondary | ICD-10-CM

## 2023-01-13 DIAGNOSIS — Z7901 Long term (current) use of anticoagulants: Secondary | ICD-10-CM | POA: Diagnosis not present

## 2023-01-13 DIAGNOSIS — I609 Nontraumatic subarachnoid hemorrhage, unspecified: Secondary | ICD-10-CM | POA: Insufficient documentation

## 2023-01-13 DIAGNOSIS — I6782 Cerebral ischemia: Secondary | ICD-10-CM | POA: Diagnosis not present

## 2023-01-13 DIAGNOSIS — I4891 Unspecified atrial fibrillation: Secondary | ICD-10-CM | POA: Insufficient documentation

## 2023-01-13 LAB — COMPREHENSIVE METABOLIC PANEL
ALT: 286 U/L — ABNORMAL HIGH (ref 0–44)
AST: 176 U/L — ABNORMAL HIGH (ref 15–41)
Albumin: 2.4 g/dL — ABNORMAL LOW (ref 3.5–5.0)
Alkaline Phosphatase: 84 U/L (ref 38–126)
Anion gap: 7 (ref 5–15)
BUN: 47 mg/dL — ABNORMAL HIGH (ref 8–23)
CO2: 24 mmol/L (ref 22–32)
Calcium: 8.4 mg/dL — ABNORMAL LOW (ref 8.9–10.3)
Chloride: 99 mmol/L (ref 98–111)
Creatinine, Ser: 0.79 mg/dL (ref 0.44–1.00)
GFR, Estimated: >60 mL/min (ref >60)
Glucose, Bld: 167 mg/dL — ABNORMAL HIGH (ref 70–99)
Potassium: 5.4 mmol/L — ABNORMAL HIGH (ref 3.5–5.1)
Sodium: 130 mmol/L — ABNORMAL LOW (ref 135–145)
Total Bilirubin: 1.1 mg/dL (ref <1.2)
Total Protein: 7.9 g/dL (ref 6.5–8.1)

## 2023-01-13 LAB — DIFFERENTIAL
Abs Immature Granulocytes: 0.19 10*3/uL — ABNORMAL HIGH (ref 0.00–0.07)
Basophils Absolute: 0 10*3/uL (ref 0.0–0.1)
Basophils Relative: 0 %
Eosinophils Absolute: 0.1 10*3/uL (ref 0.0–0.5)
Eosinophils Relative: 1 %
Immature Granulocytes: 1 %
Lymphocytes Relative: 9 %
Lymphs Abs: 1.3 10*3/uL (ref 0.7–4.0)
Monocytes Absolute: 0.7 10*3/uL (ref 0.1–1.0)
Monocytes Relative: 5 %
Neutro Abs: 12.7 10*3/uL — ABNORMAL HIGH (ref 1.7–7.7)
Neutrophils Relative %: 84 %

## 2023-01-13 LAB — PROTIME-INR
INR: 1.6 — ABNORMAL HIGH (ref 0.8–1.2)
Prothrombin Time: 18.9 s — ABNORMAL HIGH (ref 11.4–15.2)

## 2023-01-13 LAB — CBC
HCT: 30.6 % — ABNORMAL LOW (ref 36.0–46.0)
Hemoglobin: 9.8 g/dL — ABNORMAL LOW (ref 12.0–15.0)
MCH: 27.1 pg (ref 26.0–34.0)
MCHC: 32 g/dL (ref 30.0–36.0)
MCV: 84.5 fL (ref 80.0–100.0)
Platelets: 226 10*3/uL (ref 150–400)
RBC: 3.62 MIL/uL — ABNORMAL LOW (ref 3.87–5.11)
RDW: 14.4 % (ref 11.5–15.5)
WBC: 15 10*3/uL — ABNORMAL HIGH (ref 4.0–10.5)
nRBC: 0.3 % — ABNORMAL HIGH (ref 0.0–0.2)

## 2023-01-13 LAB — APTT: aPTT: 34 s (ref 24–36)

## 2023-01-13 LAB — ETHANOL: Alcohol, Ethyl (B): <10 mg/dL (ref <10)

## 2023-01-13 LAB — CBG MONITORING, ED: Glucose-Capillary: 167 mg/dL — ABNORMAL HIGH (ref 70–99)

## 2023-01-13 MED ORDER — CLEVIDIPINE BUTYRATE 0.5 MG/ML IV EMUL
0.0000 mg/h | INTRAVENOUS | Status: DC
  Filled 2023-01-13: qty 50

## 2023-01-13 MED ORDER — PROTHROMBIN COMPLEX CONC HUMAN 500 UNITS IV KIT
3603.0000 [IU] | PACK | Status: AC
  Administered 2023-01-13: 3603 [IU] via INTRAVENOUS
  Filled 2023-01-13: qty 3103

## 2023-01-13 MED ORDER — SODIUM CHLORIDE 0.9% FLUSH: 3.0000 mL | Freq: Once | INTRAVENOUS | Status: DC

## 2023-01-13 MED ORDER — MORPHINE SULFATE (PF) 2 MG/ML IV SOLN
2.0000 mg | Freq: Once | INTRAVENOUS | Status: AC
  Administered 2023-01-13: 2 mg via INTRAVENOUS
  Filled 2023-01-13: qty 1

## 2023-01-13 MED ORDER — IOHEXOL 350 MG/ML SOLN
75.0000 mL | Freq: Once | INTRAVENOUS | Status: AC | PRN
  Administered 2023-01-13: 75 mL via INTRAVENOUS

## 2023-01-13 MED ORDER — NIMODIPINE 30 MG PO CAPS
60.0000 mg | ORAL_CAPSULE | ORAL | Status: DC
  Filled 2023-01-13 (×3): qty 2

## 2023-01-13 MED ORDER — LEVETIRACETAM IN NACL 500 MG/100ML IV SOLN
500.0000 mg | Freq: Once | INTRAVENOUS | Status: AC
  Administered 2023-01-13: 500 mg via INTRAVENOUS
  Filled 2023-01-13: qty 100

## 2023-01-13 NOTE — Progress Notes (Signed)
   01/13/23 2100  Spiritual Encounters  Type of Visit Initial  Care provided to: Pt and family  Conversation partners present during encounter Nurse;Physician  Referral source Code page  Reason for visit Code  OnCall Visit Yes  Spiritual Framework  Presenting Themes Impactful experiences and emotions;Courage hope and growth  Community/Connection Family  Patient Stress Factors Health changes  Interventions  Spiritual Care Interventions Made Compassionate presence;Reflective listening  Intervention Outcomes  Outcomes Awareness around self/spiritual resourses  Spiritual Care Plan  Spiritual Care Issues Still Outstanding Referring to oncoming chaplain for further support   Chaplain received a code Stroke for patient. Chaplain went to ED and met with the patient and her family. While patient received CT scan chaplain offered compassionate presence and encouraging words to the family. Chaplain offered a blessing over the patients body. Family is still waiting for further test results from medical staff. Chaplain told the nurse to page her if the family needs her to come back. Chaplain services remain available for spiritual and emotional support.

## 2023-01-13 NOTE — Progress Notes (Addendum)
Telestroke cart was activated at [facility]. Per treatment team, pt's LKW was at 1830 with c/o L facial droop, aphasia, and increased L sided weakness. Pt had recent stroke on 11/28 with thrombectomy intervention completed. Pt is also on Eliquis for management of a fib. Dr. Anner Crete, EDP at bedside assessing patient prior to cart activation. Pt transported to CT prior to cart activation and returned to room at 1950. [facility] was paged for code stroke at 64. Dr. Bufford Buttner, [facility] appeared on telestroke cart at 1949 to assess the patient. Based on [facility]'s assessment, pt does not meet criteria for emergent interventions at this time 2/2 recent CVA, subarachnoid hemorrhage, and use of NOAs within 48 hours. [facility] requested advanced imaging. Pt was transported back to CT at 2034. [facility] to f/u with EDP regarding recommendations. No further needs from Telestroke RN at this time. Telestroke cart disconnected at 2034.

## 2023-01-13 NOTE — ED Triage Notes (Signed)
Pt to ED via [facility] from [facility] for Code Stroke, [facility] 1830. Pt recently had stroke and was sent to [facility] for rehad due to L arm weakness. Per EMS pt with new onset L side facial droop, numbness to L arm, and significantly increased weakness to L arm. Upon arrival to ED, pt taken to CT 1. Per EMS LVO score of 3.    Pt's daughter reported L sided facial droop/twitching that started while at [facility]. Reports stroke occurred on Thanksgiving day, was treated at [facility], and discharged to [facility] on Wednesday 01/11/2023

## 2023-01-13 NOTE — Consult Note (Addendum)
[facility] Consult Services   Patient Name:   Jocelyn [name], Jocelyn [name] Date of Birth:   1931/07/11 Identification Number:   MRN - [id number] Date of Service:   01/13/2023 19:46:01  Diagnosis:       I60.9 - Subarachnoid haemorrhage, unspecified  Impression: 89 yo F w/ PMHx of afib on eliquis, recent R MCA territory ischemic stroke s/p thrombectomy with TICI 3 recanalization, however with residual L sided paralysis, who presents with new onset L facial droop and R facial twitching. NIHSS 17. CTH shows interval development of scattered subarachnoid hemorrhage in the right sylvian fissure. CTA is pending to rule out aneurysm. Recommendations as below including eliquis reversal, neurosurgery consultation once CTA results, ICU admission.  Recommendation:  Diagnostic Studies:      Repeat CT head in first 8-12hrs      CTA head and neck with contrast  Laboratory Studies:       INR/PT       aPTT?       CBC  Medications:       Hold?antiplatelet?therapy/NSAIDS/Anticoagulation       Warfarin/Coumadin/DOAC reversal per hospital protocol  Additional Recommendation:      routine EEG during admission      can hold off on antiseizure medications for now  Nursing Recommendations:       Neuro checks q1-2?hrs?during ICU stay       keep BP less than 140/90's with goal of 130/80s  Consultations:       Need Neurosurgery consultation?STAT  Disposition:       Neurology will Follow    ------------------------------------------------------------------------------  Addendum: CTA Head and Neck completed, as per radiology shows: IMPRESSION: 1. Negative CTA for large vessel occlusion or other emergent finding. 2. 3-4 mm outpouchings extending posteriorly and inferiorly from both supraclinoid ICAs, which could reflect small aneurysms versus vascular infundibula. Appearance is similar as compared to recent MRA from 12/30/2022. 3. Moderate atheromatous plaque about the  carotid bifurcations and carotid siphons without hemodynamically significant stenosis. 4. Atheromatous plaque at the origin of the right vertebral artery with moderate to severe stenosis. 5.  Aortic Atherosclerosis (ICD10-I70.0).  Metrics: Last Known Well: 01/13/2023 18:30:00 Dispatch Time: 01/13/2023 19:46:01 Arrival Time: 01/13/2023 19:37:00 Initial Response Time: 01/13/2023 19:48:50 Symptoms: left facial droop, right facial twitching. Initial patient interaction: 01/13/2023 19:53:06 NIHSS Assessment Completed: 01/13/2023 20:05:30 Patient is not a candidate for Thrombolytic. Thrombolytic Medical Decision: 01/13/2023 20:05:31 Patient was not deemed candidate for Thrombolytic because of following reasons: Use of NOAC in last 48 hrs. . Significant head trauma or stroke in previous 3 months .  I personally Reviewed the CT Head and it Showed interval development of scattered subarachnoid hemorrhage in the right sylvian fissure. Evolving right MCA distribution infarcts.  Primary Provider Notified of Diagnostic Impression and Management Plan on: 01/13/2023 20:19:43    History of Present Illness: Patient is a 89 year old Female.  Patient was brought by EMS for symptoms of left facial droop, right facial twitching. Patient was LKW around 1830. She is at a rehab facility and was noted to have a new L facial droop. She was also having R face twitching.Denies other seizure activity including no convulsions. No prior history of seizures.   Daughter was with her and also mentions that earlier today she aspirated while eating around noon, and between 1pm-4pm was speaking normally but is also speaking less now.  I reviewed recent documentation in the chart. Per the discharge summary of her recent hospitalization, she had a recent R MCA  acute ischemic stroke around thanksgiving on 11/28 requiring thrombectomy for a R A1/ACA occlusion extending into ICA terminus and R M2/MCA occlusion. TICI 3  recanalization was achieved. Etiology thought to be cardioembolic in the setting of afib.  She has residual L sided paralysis since that stroke, with minimal movement of her left toes, but otherwise no other movement in her left arm or leg. She currently requires assistance for all ADLs and is unable to walk since the stroke occurred. Prior to the stroke she was fully independent.      Past Medical History:      Hypertension      Hyperlipidemia      Atrial Fibrillation      Stroke Other PMH:  arthritis unable to obtain due to:   Patient Is Confused  Medications:  Anticoagulant use:  Yes eliquis, last taken today No Antiplatelet use Reviewed EMR for current medications  Allergies:  Reviewed Allergies Unable To Obtain Due To: Patient Is Confused  Social History: Unable To Obtain Due To Patient Status : Patient Is Confused  Family History:  Family History Cannot Be Obtained Because:Patient Is Confused  ROS : ROS Cannot Be Obtained Because:  Patient Is Confused  Past Surgical History: Past Surgical History Cannot Be Obtained Because: Patient Is Confused There Is No Surgical History Contributory To Today's Visit     Examination: BP(156/67), Pulse(100), Blood Glucose(167) 1A: Level of Consciousness - Arouses to minor stimulation + 1 1B: Ask Month and Age - Could Not Answer Either Question Correctly + 2 1C: Blink Eyes & Squeeze Hands - Performs Both Tasks + 0 2: Test Horizontal Extraocular Movements - Normal + 0 3: Test Visual Fields - No Visual Loss + 0 4: Test Facial Palsy (Use Grimace if Obtunded) - Partial paralysis (lower face) + 2 5A: Test Left Arm Motor Drift - No Drift for 10 Seconds + 0 5B: Test Right Arm Motor Drift - No Drift for 10 Seconds + 0 6A: Test Left Leg Motor Drift - No Movement + 4 6B: Test Right Leg Motor Drift - No Effort Against Gravity + 3 7: Test Limb Ataxia (FNF/Heel-Shin) - No Ataxia + 0 8: Test Sensation - Normal; No sensory loss + 0 9: Test  Language/Aphasia - Mute/Global Aphasia: No Usable Speech/Auditory Comprehension + 3 10: Test Dysarthria - Mute/Anarthric + 2 11: Test Extinction/Inattention - No abnormality + 0 NIHSS Score: 17  ICH Score:  NIHSS Text : no facial twitching present on my examination, appears to have subsided from what daughter witnessed earlier   Tracy and Huss score: 3 Hunt and Huss score time:   01/13/2023 20:51:33        Lethargy or Confusion, Mild Focal Neurological Deficits (+3)   Pre-Morbid Modified Rankin Scale: 4 Points = Moderately severe disability; unable to walk and attend to bodily needs without assistance   This consult was conducted in real time using interactive audio and Immunologist. Patient was informed of the technology being used for this visit and agreed to proceed. Patient located in hospital and provider located at home/office setting.  Due to the immediate potential for life-threatening deterioration due to underlying acute neurologic illness, I spent 55 minutes providing critical care. This time includes time for face to face visit via telemedicine, review of medical records, imaging studies and discussion of findings with providers, the patient and/or family.  Dr Hughie Closs  [facility] For Inpatient follow-up with [facility] physician please call [facility] at [phone]. As we are not an  outpatient service for any post hospital discharge needs please contact the hospital for assistance. If you have any questions for the [facility] physicians or need to reconsult for clinical or diagnostic changes please contact us via [facility] at [phone].

## 2023-01-13 NOTE — ED Provider Notes (Signed)
[facility] Provider Note    Event Date/Time   First MD Initiated Contact with Patient 01/13/23 1942     (approximate)   History   Code Stroke   HPI Jocelyn [name] is a 89 y.o. female with recent right MCA territory infarct with occlusion s/p IR with TICI 3 revascularization, A-fib on Eliquis, presenting today as a code stroke.  Patient was at a rehab facility when they noticed onset of worsening right-sided facial droop and left-sided extremity weakness.  Brought into the emergency department with notable deficits.  Family stated acute onset with last known normal at 6:30 PM.  Patient have a difficulty communicating and providing additional exam.  Chart review: Reviewed recent admission for right MCA ischemic stroke.     Physical Exam   Triage Vital Signs: ED Triage Vitals  Encounter Vitals Group     BP 01/13/23 2000 (!) 156/67     Systolic BP Percentile --      Diastolic BP Percentile --      Pulse Rate 01/13/23 2000 100     Resp 01/13/23 2000 13     Temp 01/13/23 2000 98 F (36.7 C)     Temp Source 01/13/23 2000 Axillary     SpO2 01/13/23 2000 97 %     Weight 01/13/23 1956 153 lb 9.6 oz (69.7 kg)     Height 01/13/23 1956 5\' 3"  (1.6 m)     Head Circumference --      Peak Flow --      Pain Score --      Pain Loc --      Pain Education --      Exclude from Growth Chart --     Most recent vital signs: Vitals:   01/13/23 2130 01/13/23 2200  BP: (!) 143/64 (!) 146/71  Pulse: (!) 106 (!) 107  Resp: (!) 30 (!) 21  Temp:    SpO2: 98% 98%   I have reviewed the vital signs. General:  Awake, alert, difficulty communicating on exam Head:  Normocephalic, Atraumatic. EENT:  PERRL, EOMI, Oral mucosa pink and moist, Neck is supple. Cardiovascular: Regular rate, 2+ distal pulses. Respiratory:  Normal respiratory effort, symmetrical expansion, no distress.   Extremities:  Moving all four extremities through full ROM without pain.   Neuro:   Alert.  Right-sided facial droop present.  0/5 strength on left upper extremity with unable to hold it all against gravity or initiate any movement with squeezing of the left hand.  5 out of 5 strength throughout right sided extremities.  Flaccid left lower extremity.  Dysarthric speech. Skin:  Warm, dry, no rash.   Psych: Appropriate affect.    ED Results / Procedures / Treatments   Labs (all labs ordered are listed, but only abnormal results are displayed) Labs Reviewed  CBC - Abnormal; Notable for the following components:      Result Value   WBC 15.0 (*)    RBC 3.62 (*)    Hemoglobin 9.8 (*)    HCT 30.6 (*)    nRBC 0.3 (*)    All other components within normal limits  DIFFERENTIAL - Abnormal; Notable for the following components:   Neutro Abs 12.7 (*)    Abs Immature Granulocytes 0.19 (*)    All other components within normal limits  COMPREHENSIVE METABOLIC PANEL - Abnormal; Notable for the following components:   Sodium 130 (*)    Potassium 5.4 (*)    Glucose, Bld 167 (*)  BUN 47 (*)    Calcium 8.4 (*)    Albumin 2.4 (*)    AST 176 (*)    ALT 286 (*)    All other components within normal limits  PROTIME-INR - Abnormal; Notable for the following components:   Prothrombin Time 18.9 (*)    INR 1.6 (*)    All other components within normal limits  CBG MONITORING, ED - Abnormal; Notable for the following components:   Glucose-Capillary 167 (*)    All other components within normal limits  ETHANOL  APTT     EKG My EKG interpretation: Rate of 99, atrial fibrillation.  No acute ST elevations or depressions   RADIOLOGY Independently interpreted CT imaging and agree with radiology read showing evidence of new right-sided subarachnoid hemorrhage as well as aneurysms   PROCEDURES:  Critical Care performed: Yes, see critical care procedure note(s)  .Critical Care  Performed by: Janith Lima, MD Authorized by: Janith Lima, MD   Critical care provider  statement:    Critical care time (minutes):  40   Critical care was necessary to treat or prevent imminent or life-threatening deterioration of the following conditions: Hemorrhagic CVA.   Critical care was time spent personally by me on the following activities:  Development of treatment plan with patient or surrogate, discussions with consultants, evaluation of patient's response to treatment, examination of patient, ordering and review of laboratory studies, ordering and review of radiographic studies, ordering and performing treatments and interventions, pulse oximetry, re-evaluation of patient's condition and review of old charts   I assumed direction of critical care for this patient from another provider in my specialty: no     Care discussed with: admitting provider      MEDICATIONS ORDERED IN ED: Medications  sodium chloride flush (NS) 0.9 % injection 3 mL (3 mLs Intravenous Not Given 01/13/23 2043)  niMODipine (NIMOTOP) capsule 60 mg (60 mg Oral Not Given 01/13/23 2226)  clevidipine (CLEVIPREX) infusion 0.5 mg/mL (has no administration in time range)  iohexol (OMNIPAQUE) 350 MG/ML injection 75 mL (75 mLs Intravenous Contrast Given 01/13/23 2038)  prothrombin complex conc human (KCENTRA) IVPB 3,603 Units (0 Units Intravenous Stopped 01/13/23 2210)  levETIRAcetam (KEPPRA) IVPB 500 mg/100 mL premix (0 mg Intravenous Stopped 01/13/23 2226)  morphine (PF) 2 MG/ML injection 2 mg (2 mg Intravenous Given 01/13/23 2138)     IMPRESSION / MDM / ASSESSMENT AND PLAN / ED COURSE  I reviewed the triage vital signs and the nursing notes.                              Differential diagnosis includes, but is not limited to, ischemic CVA, hemorrhagic CVA, SAH, seizures, recrudescence of stroke symptoms  Patient's presentation is most consistent with acute presentation with potential threat to life or bodily function.  Patient is a 89 year old female presenting today as a code stroke with  worsening right-sided facial droop and left-sided weakness in the setting of recent CVA with IR intervention.  CT head shows new right-sided subarachnoid hemorrhage in the sylvian fissure.  NIHSS of 3.  Neurology recommending reversal with Kcentra.  CTA was ordered and neurosurgery consultation recommended.  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  Given this finding, neurosurgery recommends transfer to [facility] neuro ICU for higher level of care and possible intervention.  They also recommend giving Keppra at this time.  Spoke with neurology at [facility].  They recommend initiation of pneumonia pain and clevidipine for blood pressure systolic goal between 140 and 829.  They also agree to transfer to [facility] for further neurological management.  Patient signed out at end of shift pending transport to [facility].  The patient is on the cardiac monitor to evaluate for evidence of arrhythmia and/or significant heart rate changes. Clinical Course as of 01/13/23 2237  Fri Jan 13, 2023  2001 Radiology - small new SAH. 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 There may be one on the left side and could potentially be source. [DW]  2021 [facility] - Has new interval SAH. CTA head and neck ordered to evaluate for aneurysm. With facial twitching, recommend routine EEG. Does not need continuous EEG. Recommends eliquis reversal with Kcentra. Discuss with neurosurgery as well.  [DW]  2120 Neurosurgery - would give 500mg  keppra. Recs transfer to [facility] Neuro ICU for further care [DW]  2155 [facility] Neurology - appears to be hemorrhagic transformation.  [DW]    Clinical Course User Index [DW] Janith Lima, MD     FINAL CLINICAL IMPRESSION(S) / ED DIAGNOSES   Final diagnoses:  Hemorrhagic cerebrovascular accident (CVA) (HCC)  Facial droop  Left-sided weakness     Rx / DC Orders   ED Discharge Orders     None        Note:  This document was prepared using Dragon  voice recognition software and may include unintentional dictation errors.   Janith Lima, MD 01/13/23 [phone]

## 2023-01-13 NOTE — ED Notes (Signed)
Pt has been grimmacing. Family asked could she get something for pain. MD made aware.

## 2023-01-14 ENCOUNTER — Inpatient Hospital Stay (HOSPITAL_COMMUNITY): Payer: 59

## 2023-01-14 ENCOUNTER — Inpatient Hospital Stay (HOSPITAL_COMMUNITY)
Admission: AD | Admit: 2023-01-14 | Discharge: 2023-02-07 | DRG: 981 | Disposition: A | Discharge status: Skilled Nursing Facility | Payer: 59 | Source: Other Acute Inpatient Hospital | Attending: Internal Medicine | Admitting: Internal Medicine

## 2023-01-14 DIAGNOSIS — I6031 Nontraumatic subarachnoid hemorrhage from right posterior communicating artery: Secondary | ICD-10-CM | POA: Diagnosis not present

## 2023-01-14 DIAGNOSIS — I614 Nontraumatic intracerebral hemorrhage in cerebellum: Secondary | ICD-10-CM | POA: Diagnosis not present

## 2023-01-14 DIAGNOSIS — I472 Ventricular tachycardia, unspecified: Secondary | ICD-10-CM | POA: Diagnosis not present

## 2023-01-14 DIAGNOSIS — L03311 Cellulitis of abdominal wall: Secondary | ICD-10-CM | POA: Diagnosis present

## 2023-01-14 DIAGNOSIS — K9422 Gastrostomy infection: Secondary | ICD-10-CM | POA: Diagnosis present

## 2023-01-14 DIAGNOSIS — I6310 Cerebral infarction due to embolism of unspecified precerebral artery: Secondary | ICD-10-CM | POA: Diagnosis not present

## 2023-01-14 DIAGNOSIS — M170 Bilateral primary osteoarthritis of knee: Secondary | ICD-10-CM | POA: Diagnosis present

## 2023-01-14 DIAGNOSIS — G936 Cerebral edema: Secondary | ICD-10-CM | POA: Diagnosis present

## 2023-01-14 DIAGNOSIS — L89152 Pressure ulcer of sacral region, stage 2: Secondary | ICD-10-CM | POA: Diagnosis not present

## 2023-01-14 DIAGNOSIS — R29727 NIHSS score 27: Secondary | ICD-10-CM | POA: Diagnosis present

## 2023-01-14 DIAGNOSIS — R29724 NIHSS score 24: Secondary | ICD-10-CM | POA: Diagnosis not present

## 2023-01-14 DIAGNOSIS — R945 Abnormal results of liver function studies: Secondary | ICD-10-CM | POA: Diagnosis present

## 2023-01-14 DIAGNOSIS — I161 Hypertensive emergency: Secondary | ICD-10-CM | POA: Diagnosis present

## 2023-01-14 DIAGNOSIS — D509 Iron deficiency anemia, unspecified: Secondary | ICD-10-CM | POA: Diagnosis present

## 2023-01-14 DIAGNOSIS — I611 Nontraumatic intracerebral hemorrhage in hemisphere, cortical: Secondary | ICD-10-CM | POA: Diagnosis present

## 2023-01-14 DIAGNOSIS — G8194 Hemiplegia, unspecified affecting left nondominant side: Secondary | ICD-10-CM | POA: Diagnosis present

## 2023-01-14 DIAGNOSIS — L89626 Pressure-induced deep tissue damage of left heel: Secondary | ICD-10-CM | POA: Diagnosis not present

## 2023-01-14 DIAGNOSIS — B370 Candidal stomatitis: Secondary | ICD-10-CM | POA: Diagnosis present

## 2023-01-14 DIAGNOSIS — R1310 Dysphagia, unspecified: Secondary | ICD-10-CM | POA: Diagnosis present

## 2023-01-14 DIAGNOSIS — E875 Hyperkalemia: Secondary | ICD-10-CM | POA: Diagnosis present

## 2023-01-14 DIAGNOSIS — R54 Age-related physical debility: Secondary | ICD-10-CM | POA: Diagnosis present

## 2023-01-14 DIAGNOSIS — Z833 Family history of diabetes mellitus: Secondary | ICD-10-CM

## 2023-01-14 DIAGNOSIS — L8990 Pressure ulcer of unspecified site, unspecified stage: Secondary | ICD-10-CM | POA: Insufficient documentation

## 2023-01-14 DIAGNOSIS — Z6826 Body mass index (BMI) 26.0-26.9, adult: Secondary | ICD-10-CM

## 2023-01-14 DIAGNOSIS — J690 Pneumonitis due to inhalation of food and vomit: Secondary | ICD-10-CM | POA: Diagnosis not present

## 2023-01-14 DIAGNOSIS — E440 Moderate protein-calorie malnutrition: Secondary | ICD-10-CM | POA: Diagnosis present

## 2023-01-14 DIAGNOSIS — R7401 Elevation of levels of liver transaminase levels: Secondary | ICD-10-CM | POA: Diagnosis not present

## 2023-01-14 DIAGNOSIS — Z5986 Financial insecurity: Secondary | ICD-10-CM

## 2023-01-14 DIAGNOSIS — Z7983 Long term (current) use of bisphosphonates: Secondary | ICD-10-CM

## 2023-01-14 DIAGNOSIS — G9349 Other encephalopathy: Secondary | ICD-10-CM | POA: Diagnosis present

## 2023-01-14 DIAGNOSIS — R9401 Abnormal electroencephalogram [EEG]: Secondary | ICD-10-CM | POA: Diagnosis present

## 2023-01-14 DIAGNOSIS — B9689 Other specified bacterial agents as the cause of diseases classified elsewhere: Secondary | ICD-10-CM | POA: Diagnosis present

## 2023-01-14 DIAGNOSIS — L02211 Cutaneous abscess of abdominal wall: Secondary | ICD-10-CM | POA: Diagnosis present

## 2023-01-14 DIAGNOSIS — R7303 Prediabetes: Secondary | ICD-10-CM | POA: Diagnosis present

## 2023-01-14 DIAGNOSIS — E785 Hyperlipidemia, unspecified: Secondary | ICD-10-CM | POA: Diagnosis present

## 2023-01-14 DIAGNOSIS — I482 Chronic atrial fibrillation, unspecified: Secondary | ICD-10-CM | POA: Diagnosis present

## 2023-01-14 DIAGNOSIS — R29810 Facial weakness: Secondary | ICD-10-CM | POA: Diagnosis present

## 2023-01-14 DIAGNOSIS — Z90710 Acquired absence of both cervix and uterus: Secondary | ICD-10-CM

## 2023-01-14 DIAGNOSIS — Z9049 Acquired absence of other specified parts of digestive tract: Secondary | ICD-10-CM

## 2023-01-14 DIAGNOSIS — Z8249 Family history of ischemic heart disease and other diseases of the circulatory system: Secondary | ICD-10-CM

## 2023-01-14 DIAGNOSIS — G4733 Obstructive sleep apnea (adult) (pediatric): Secondary | ICD-10-CM | POA: Diagnosis not present

## 2023-01-14 DIAGNOSIS — R7881 Bacteremia: Secondary | ICD-10-CM | POA: Diagnosis not present

## 2023-01-14 DIAGNOSIS — Z79899 Other long term (current) drug therapy: Secondary | ICD-10-CM

## 2023-01-14 DIAGNOSIS — I63511 Cerebral infarction due to unspecified occlusion or stenosis of right middle cerebral artery: Secondary | ICD-10-CM | POA: Diagnosis present

## 2023-01-14 DIAGNOSIS — A4159 Other Gram-negative sepsis: Secondary | ICD-10-CM | POA: Diagnosis not present

## 2023-01-14 DIAGNOSIS — I10 Essential (primary) hypertension: Secondary | ICD-10-CM | POA: Diagnosis present

## 2023-01-14 DIAGNOSIS — I619 Nontraumatic intracerebral hemorrhage, unspecified: Principal | ICD-10-CM | POA: Diagnosis present

## 2023-01-14 DIAGNOSIS — I609 Nontraumatic subarachnoid hemorrhage, unspecified: Secondary | ICD-10-CM | POA: Diagnosis not present

## 2023-01-14 DIAGNOSIS — Z7901 Long term (current) use of anticoagulants: Secondary | ICD-10-CM

## 2023-01-14 DIAGNOSIS — I613 Nontraumatic intracerebral hemorrhage in brain stem: Secondary | ICD-10-CM | POA: Diagnosis not present

## 2023-01-14 DIAGNOSIS — K219 Gastro-esophageal reflux disease without esophagitis: Secondary | ICD-10-CM | POA: Diagnosis present

## 2023-01-14 DIAGNOSIS — R471 Dysarthria and anarthria: Secondary | ICD-10-CM | POA: Diagnosis present

## 2023-01-14 DIAGNOSIS — E782 Mixed hyperlipidemia: Secondary | ICD-10-CM | POA: Diagnosis not present

## 2023-01-14 LAB — GLUCOSE, CAPILLARY
Glucose-Capillary: 138 mg/dL — ABNORMAL HIGH (ref 70–99)
Glucose-Capillary: 129 mg/dL — ABNORMAL HIGH (ref 70–99)
Glucose-Capillary: 135 mg/dL — ABNORMAL HIGH (ref 70–99)
Glucose-Capillary: 132 mg/dL — ABNORMAL HIGH (ref 70–99)
Glucose-Capillary: 121 mg/dL — ABNORMAL HIGH (ref 70–99)
Glucose-Capillary: 166 mg/dL — ABNORMAL HIGH (ref 70–99)

## 2023-01-14 LAB — PHOSPHORUS
Phosphorus: 4 mg/dL (ref 2.5–4.6)
Phosphorus: 4.6 mg/dL (ref 2.5–4.6)

## 2023-01-14 LAB — URINALYSIS, ROUTINE W REFLEX MICROSCOPIC
Bilirubin Urine: NEGATIVE
Glucose, UA: NEGATIVE mg/dL
Hgb urine dipstick: NEGATIVE
Ketones, ur: NEGATIVE mg/dL
Leukocytes,Ua: NEGATIVE
Nitrite: NEGATIVE
Protein, ur: NEGATIVE mg/dL
Specific Gravity, Urine: 1.044 — ABNORMAL HIGH (ref 1.005–1.030)
pH: 5 (ref 5.0–8.0)

## 2023-01-14 LAB — MRSA NEXT GEN BY PCR, NASAL: MRSA by PCR Next Gen: NOT DETECTED

## 2023-01-14 LAB — MAGNESIUM
Magnesium: 2.4 mg/dL (ref 1.7–2.4)
Magnesium: 2.5 mg/dL — ABNORMAL HIGH (ref 1.7–2.4)

## 2023-01-14 MED ORDER — ORAL CARE MOUTH RINSE: 15.0000 mL | OROMUCOSAL | Status: DC | PRN

## 2023-01-14 MED ORDER — AMLODIPINE BESYLATE 10 MG PO TABS
10.0000 mg | ORAL_TABLET | Freq: Every day | ORAL | Status: DC
  Filled 2023-01-14: qty 1

## 2023-01-14 MED ORDER — SODIUM CHLORIDE 0.9 % IV SOLN: INTRAVENOUS | Status: DC

## 2023-01-14 MED ORDER — INSULIN ASPART 100 UNIT/ML IJ SOLN
0.0000 [IU] | Freq: Three times a day (TID) | INTRAMUSCULAR | Status: DC
  Administered 2023-01-14 (×2): 2 [IU] via SUBCUTANEOUS

## 2023-01-14 MED ORDER — PANTOPRAZOLE SODIUM 40 MG IV SOLR
40.0000 mg | Freq: Every day | INTRAVENOUS | Status: DC
  Administered 2023-01-14 – 2023-01-17 (×4): 40 mg via INTRAVENOUS
  Filled 2023-01-14 (×4): qty 10

## 2023-01-14 MED ORDER — CHLORHEXIDINE GLUCONATE CLOTH 2 % EX PADS
6.0000 | MEDICATED_PAD | Freq: Every day | CUTANEOUS | Status: DC
  Administered 2023-01-14 – 2023-01-27 (×12): 6 via TOPICAL

## 2023-01-14 MED ORDER — OSMOLITE 1.2 CAL PO LIQD
1000.0000 mL | ORAL | Status: DC
  Administered 2023-01-15 – 2023-02-07 (×22): 1000 mL
  Filled 2023-01-14 (×7): qty 1000

## 2023-01-14 MED ORDER — NIMODIPINE 6 MG/ML PO SOLN
60.0000 mg | ORAL | Status: DC
  Administered 2023-01-14 – 2023-01-15 (×12): 60 mg
  Filled 2023-01-14 (×12): qty 10

## 2023-01-14 MED ORDER — HYDROCODONE-ACETAMINOPHEN 5-325 MG PO TABS: 1.0000 | ORAL_TABLET | Freq: Four times a day (QID) | ORAL | Status: DC | PRN

## 2023-01-14 MED ORDER — HYDROCHLOROTHIAZIDE 12.5 MG PO TABS
12.5000 mg | ORAL_TABLET | Freq: Every day | ORAL | Status: DC
  Filled 2023-01-14: qty 1

## 2023-01-14 MED ORDER — NIMODIPINE 30 MG PO CAPS: 60.0000 mg | ORAL_CAPSULE | ORAL | Status: DC

## 2023-01-14 MED ORDER — ORAL CARE MOUTH RINSE
15.0000 mL | OROMUCOSAL | Status: DC
  Administered 2023-01-14 – 2023-02-07 (×97): 15 mL via OROMUCOSAL

## 2023-01-14 MED ORDER — STROKE: EARLY STAGES OF RECOVERY BOOK: Freq: Once | Status: DC

## 2023-01-14 MED ORDER — OSMOLITE 1.5 CAL PO LIQD
1000.0000 mL | ORAL | Status: DC
  Administered 2023-01-14 (×2): 1000 mL

## 2023-01-14 MED ORDER — ACETAMINOPHEN 325 MG PO TABS: 650.0000 mg | ORAL_TABLET | ORAL | Status: DC | PRN

## 2023-01-14 MED ORDER — SPIRONOLACTONE 12.5 MG HALF TABLET
12.5000 mg | ORAL_TABLET | Freq: Every day | ORAL | Status: DC
Start: 2023-01-14 — End: 2023-01-14
  Filled 2023-01-14: qty 1

## 2023-01-14 MED ORDER — CLEVIDIPINE BUTYRATE 0.5 MG/ML IV EMUL: 0.0000 mg/h | INTRAVENOUS | Status: DC

## 2023-01-14 MED ORDER — ATORVASTATIN CALCIUM 40 MG PO TABS
40.0000 mg | ORAL_TABLET | Freq: Every day | ORAL | Status: DC
  Administered 2023-01-14 – 2023-01-15 (×2): 40 mg
  Filled 2023-01-14 (×2): qty 1

## 2023-01-14 MED ORDER — AMLODIPINE BESYLATE 5 MG PO TABS
5.0000 mg | ORAL_TABLET | Freq: Every day | ORAL | Status: DC
  Administered 2023-01-17 – 2023-01-20 (×4): 5 mg
  Filled 2023-01-14 (×5): qty 1

## 2023-01-14 MED ORDER — IPRATROPIUM-ALBUTEROL 0.5-2.5 (3) MG/3ML IN SOLN
3.0000 mL | Freq: Four times a day (QID) | RESPIRATORY_TRACT | Status: DC | PRN
  Administered 2023-01-21: 3 mL via RESPIRATORY_TRACT

## 2023-01-14 MED ORDER — FERROUS SULFATE 75 (15 FE) MG/ML PO SOLN
65.0000 mg | Freq: Every day | ORAL | Status: DC
Start: 2023-01-14 — End: 2023-01-18
  Administered 2023-01-14 – 2023-01-18 (×4): 65 mg
  Filled 2023-01-14 (×5): qty 4.3

## 2023-01-14 MED ORDER — IRBESARTAN 150 MG PO TABS
150.0000 mg | ORAL_TABLET | Freq: Every day | ORAL | Status: DC
  Filled 2023-01-14: qty 1

## 2023-01-14 MED ORDER — FREE WATER
30.0000 mL | Status: DC
  Administered 2023-01-14 – 2023-01-15 (×6): 30 mL

## 2023-01-14 MED ORDER — SENNOSIDES-DOCUSATE SODIUM 8.6-50 MG PO TABS
1.0000 | ORAL_TABLET | Freq: Two times a day (BID) | ORAL | Status: DC
  Administered 2023-01-14 – 2023-01-15 (×4): 1
  Filled 2023-01-14 (×6): qty 1

## 2023-01-14 MED ORDER — FERROUS SULFATE 325 (65 FE) MG PO TABS: 325.0000 mg | ORAL_TABLET | Freq: Every day | ORAL | Status: DC

## 2023-01-14 MED ORDER — ACETAMINOPHEN 160 MG/5ML PO SOLN
650.0000 mg | ORAL | Status: DC | PRN
  Administered 2023-01-14 – 2023-02-04 (×11): 650 mg
  Filled 2023-01-14 (×11): qty 20.3

## 2023-01-14 MED ORDER — CALCIUM CITRATE-VITAMIN D 500-400 MG-UNIT PO CHEW: 1.0000 | CHEWABLE_TABLET | Freq: Two times a day (BID) | ORAL | Status: DC

## 2023-01-14 MED ORDER — OYSTER SHELL CALCIUM/D3 500-5 MG-MCG PO TABS
1.0000 | ORAL_TABLET | Freq: Two times a day (BID) | ORAL | Status: DC
Start: 2023-01-14 — End: 2023-01-18
  Administered 2023-01-14 – 2023-01-18 (×8): 1
  Filled 2023-01-14 (×9): qty 1

## 2023-01-14 MED ORDER — ACETAMINOPHEN 650 MG RE SUPP: 650.0000 mg | RECTAL | Status: DC | PRN

## 2023-01-14 MED ORDER — INSULIN ASPART 100 UNIT/ML IJ SOLN
0.0000 [IU] | INTRAMUSCULAR | Status: DC
  Administered 2023-01-14: 3 [IU] via SUBCUTANEOUS
  Administered 2023-01-14 (×2): 2 [IU] via SUBCUTANEOUS
  Administered 2023-01-15 (×2): 3 [IU] via SUBCUTANEOUS
  Administered 2023-01-15 (×2): 2 [IU] via SUBCUTANEOUS
  Administered 2023-01-15: 3 [IU] via SUBCUTANEOUS
  Administered 2023-01-17 (×3): 2 [IU] via SUBCUTANEOUS
  Administered 2023-01-17: 3 [IU] via SUBCUTANEOUS
  Administered 2023-01-17 (×3): 2 [IU] via SUBCUTANEOUS
  Administered 2023-01-18: 3 [IU] via SUBCUTANEOUS
  Administered 2023-01-18 (×2): 2 [IU] via SUBCUTANEOUS
  Administered 2023-01-18 (×3): 3 [IU] via SUBCUTANEOUS
  Administered 2023-01-19 (×3): 2 [IU] via SUBCUTANEOUS
  Administered 2023-01-19: 3 [IU] via SUBCUTANEOUS
  Administered 2023-01-19 – 2023-01-20 (×4): 2 [IU] via SUBCUTANEOUS
  Administered 2023-01-20 (×2): 3 [IU] via SUBCUTANEOUS
  Administered 2023-01-20 – 2023-01-21 (×3): 2 [IU] via SUBCUTANEOUS

## 2023-01-14 NOTE — Progress Notes (Signed)
EEG complete - results pending 

## 2023-01-14 NOTE — Progress Notes (Signed)
PT Cancellation Note  Patient Details Name: Jocelyn [name] MRN: [id number] DOB: 1931/12/04   Cancelled Treatment:    Reason Eval/Treat Not Completed: Active bedrest order. 24-hour bedrest order placed 12/14 0129.    Ilda Foil 01/14/2023, 7:15 AM

## 2023-01-14 NOTE — Progress Notes (Signed)
OT Cancellation Note  Patient Details Name: Jocelyn [name] MRN: [id number] DOB: 11-07-1931   Cancelled Treatment:    Reason Eval/Treat Not Completed: Active bedrest order. Will return as schedule allows and patient cleared for activity.   Lizzete Gough M Ruchy Wildrick Desirre Eickhoff MSOT, OTR/L Acute Rehab Office: [phone] 01/14/2023, 8:11 AM

## 2023-01-14 NOTE — Progress Notes (Signed)
SLP Cancellation Note  Patient Details Name: Jocelyn [name] MRN: [id number] DOB: 05/23/1931   Cancelled treatment:       Pt attempted to be seen following completion of EEG. Upon arrival, the pt was very lethargic and unable to rouse given loud verbal cueing and tactile touch. Not appropriate for eval at this time, SLP to f/u when medically appropriate.   Dione Housekeeper M.S. CCC-SLP

## 2023-01-14 NOTE — Procedures (Signed)
Patient Name: Jocelyn [name]  MRN: [id number]  Epilepsy Attending: Charlsie Quest  Referring Physician/Provider: Elmer Picker, NP  Date: 01/14/2023 Duration: 23.35 mins  Patient history:  89 y.o. female who re-presents with right parietotemporal predominantly subarachnoid hemorrhage with a small parenchymal component at the location of one of her recent strokes, while on Eliquis. Discharged on 12/11 after admission for right hemisphere strokes. EEG to evaluate for seizure  Level of alertness: Awake, asleep  AEDs during EEG study: None  Technical aspects: This EEG study was done with scalp electrodes positioned according to the 10-20 International system of electrode placement. Electrical activity was reviewed with band pass filter of 1-70Hz , sensitivity of 7 uV/mm, display speed of 86mm/sec with a 60Hz  notched filter applied as appropriate. EEG data were recorded continuously and digitally stored.  Video monitoring was available and reviewed as appropriate.  Description: The posterior dominant rhythm consists of 7.5 Hz activity of moderate voltage (25-35 uV) seen predominantly in posterior head regions, symmetric and reactive to eye opening and eye closing. Sleep was characterized by vertex waves, sleep spindles (12 to 14 Hz), maximal frontocentral region. EEG showed continuous generalized and lateralized right hemisphere 3 to 6 Hz theta-delta slowing. Generalized sharp transients were noted. Hyperventilation and photic stimulation were not performed.     ABNORMALITY - Continuous slow, generalized and lateralized right hemisphere  IMPRESSION: This study is suggestive of cortical dysfunction arising from right hemisphere likely secondary to underlying structural abnormality. Additionally there is moderate diffuse encephalopathy. No seizures were seen throughout the recording.  Generalized sharp transients were noted. Recommend long term monitoring for further evaluation  Zohar Maroney Annabelle Harman

## 2023-01-14 NOTE — Progress Notes (Signed)
LTM EEG hooked up and running - no initial skin breakdown - push button tested - [facility] monitoring.MRI leads used

## 2023-01-14 NOTE — ED Notes (Signed)
Cleviprex sent with [facility] for BP contorl if needed.

## 2023-01-14 NOTE — Progress Notes (Signed)
SLP Cancellation Note  Patient Details Name: Jocelyn [name] MRN: [id number] DOB: 09/28/1931   Cancelled treatment:        Pt attempted to be seen this AM, pt not available due to EEG. SLP to f/u as time allows.    Dione Housekeeper M.S. CCC-SLP

## 2023-01-14 NOTE — Progress Notes (Addendum)
STROKE TEAM PROGRESS NOTE   BRIEF HPI Ms. ANNELIESE GOKHALE is a 89 y.o. female with history of Arthritis of both knees, Colitis, Essential hypertension, GERD, iron deficiency anemia, Hyperlipidemia, Lumbar pain, OSA on CPAP (08/01/2015), Overweight, Prediabetes, and SOB., atrial fibrillation (on Eliquis) who was recently discharged from the Stroke service at [facility] on 12/11 after management of an acute right MCA territory infarct in the setting of atrial fibrillation. She had presented at that time with left-sided weakness and confusion. Imaging had revealed a right A1 and M2 occlusion and she underwent successful mechanical thrombectomy, with TICI 3 revascularization. which was successful. Her Eliquis was restarted on 12/10 for her chronic A-fib. She was initially unable to pass her swallow study, and PEG tube was inserted on 12/9 with tube feeding started, although patient did pass swallow study afterwards and was beginning to take foods by mouth at the time of her discharge. She did have some pain in her left knee, which was successfully treated with lidocaine patches. She was discharged to a SNF.   She re-presented to the ED at [facility] on Friday from [facility] rehab facility for acute stroke symptoms. LKN was 1830. She had been at [facility] for LUE weakness. Per EMS she had new onset L side facial droop, numbness to L arm, and significantly increased weakness to L arm. CTH showed interval development of scattered subarachnoid hemorrhage in the right sylvian fissure. Teleneurology was consulted. NIHSS was 17. Teleneurology advised DOAC reversal with KCentra, BP management, EEG for observed facial twitching and admission as well as Neurosurgery consult. Neurosurgery recommended Keppra 500 mg. CTA showed no LVO; atheromatous plaque was again noted. 3-4 mm outpouchings extending posteriorly and inferiorly from both supraclinoid ICAs were noted, which Radiology felt could reflect small aneurysms versus vascular  infundibula; the outpouchings were similar in appearance compared to recent MRA from 12/30/2022.  NIH on Admission 17  SIGNIFICANT HOSPITAL EVENTS 12/11- Discharged to rehab 12/14- Admitted with [facility]  INTERIM HISTORY/SUBJECTIVE Na 130, K 5.4- hypotenisve, will adjust BP medications. WBC 15.0 Seen in room, no family at the bedside.   OBJECTIVE  CBC    Component Value Date/Time   WBC 15.0 (H) 01/13/2023 2020   RBC 3.62 (L) 01/13/2023 2020   HGB 9.8 (L) 01/13/2023 2020   HGB 12.5 09/19/2022 0916   HCT 30.6 (L) 01/13/2023 2020   HCT 39.6 09/19/2022 0916   PLT 226 01/13/2023 2020   PLT 169 09/19/2022 0916   MCV 84.5 01/13/2023 2020   MCV 87 09/19/2022 0916   MCV 82 04/13/2011 1125   MCH 27.1 01/13/2023 2020   MCHC 32.0 01/13/2023 2020   RDW 14.4 01/13/2023 2020   RDW 12.9 09/19/2022 0916   RDW 14.4 04/13/2011 1125   LYMPHSABS 1.3 01/13/2023 2020   LYMPHSABS 3.7 (H) 07/08/2015 1136   MONOABS 0.7 01/13/2023 2020   EOSABS 0.1 01/13/2023 2020   EOSABS 0.2 07/08/2015 1136   BASOSABS 0.0 01/13/2023 2020   BASOSABS 0.1 07/08/2015 1136    BMET    Component Value Date/Time   NA 130 (L) 01/13/2023 2020   NA 143 12/07/2022 1535   NA 141 04/13/2011 1125   K 5.4 (H) 01/13/2023 2020   K 3.4 (L) 04/13/2011 1125   CL 99 01/13/2023 2020   CL 105 04/13/2011 1125   CO2 24 01/13/2023 2020   CO2 29 04/13/2011 1125   GLUCOSE 167 (H) 01/13/2023 2020   GLUCOSE 111 (H) 04/13/2011 1125   BUN 47 (H) 01/13/2023  2020   BUN 16 12/07/2022 1535   BUN 14 04/13/2011 1125   CREATININE 0.79 01/13/2023 2020   CREATININE 0.80 08/01/2018 1544   CALCIUM 8.4 (L) 01/13/2023 2020   CALCIUM 9.2 04/13/2011 1125   EGFR 74 12/07/2022 1535   GFRNONAA >60 01/13/2023 2020   GFRNONAA 67 08/01/2018 1544    IMAGING past 24 hours MR BRAIN WO CONTRAST Result Date: 01/13/2023 CLINICAL DATA:  Follow-up examination for recent stroke, now with evidence for hemorrhagic transformation. EXAM: MRI HEAD WITHOUT  CONTRAST TECHNIQUE: Multiplanar, multiecho pulse sequences of the brain and surrounding structures were obtained without intravenous contrast. COMPARISON:  Prior CTs from earlier the same day as well as recent MRI from 12/30/2022. FINDINGS: Brain: Cerebral volume within normal limits for age. Patchy T2/FLAIR hyperintensity involving the supratentorial cerebral white matter, consistent with chronic small vessel ischemic disease, moderately advanced in nature. Small remote left cerebellar infarct. There has been normal expected interval evolution of previously identified right MCA distribution infarct. Overall, size and distribution of the infarct is relatively stable, although there has been some interval blooming of a few foci since previous. No new areas of infarction. Changes of wallerian degeneration noted extending along the descending right white matter tract. Associated hemorrhagic transformation with small volume subarachnoid hemorrhage within the right sylvian fissure, stable from prior CT. No other acute or subacute ischemic changes elsewhere within the brain. No other acute or chronic intracranial blood products. No mass lesion or midline shift. No hydrocephalus or extra-axial fluid collection. Pituitary gland suprasellar region within normal limits. Vascular: Major intracranial vascular flow voids are maintained. Skull and upper cervical spine: Craniocervical junction within normal limits. Bone marrow signal intensity normal. No scalp soft tissue abnormality. Sinuses/Orbits: Prior bilateral ocular lens replacement. Paranasal sinuses are largely clear. Trace left mastoid effusion, of doubtful significance. Other: None. IMPRESSION: 1. Normal expected interval evolution of previously identified right MCA distribution infarct. Associated hemorrhagic transformation with small volume subarachnoid hemorrhage within the right Sylvian fissure, stable from prior CT. 2. No other new acute intracranial abnormality.  3. Underlying moderately advanced chronic microvascular ischemic disease. Electronically Signed   By: Rise Mu M.D.   On: 01/13/2023 23:35   CT ANGIO HEAD NECK W WO CM (CODE STROKE) Result Date: 01/13/2023 CLINICAL DATA:  Initial evaluation for acute neuro deficit, stroke, subarachnoid hemorrhage. EXAM: CT ANGIOGRAPHY HEAD AND NECK WITH AND WITHOUT CONTRAST TECHNIQUE: Multidetector CT imaging of the head and neck was performed using the standard protocol during bolus administration of intravenous contrast. Multiplanar CT image reconstructions and MIPs were obtained to evaluate the vascular anatomy. Carotid stenosis measurements (when applicable) are obtained utilizing NASCET criteria, using the distal internal carotid diameter as the denominator. RADIATION DOSE REDUCTION: This exam was performed according to the departmental dose-optimization program which includes automated exposure control, adjustment of the mA and/or kV according to patient size and/or use of iterative reconstruction technique. CONTRAST:  75mL OMNIPAQUE IOHEXOL 350 MG/ML SOLN COMPARISON:  Prior CT from earlier the same day as well as MRA from 12/30/2022 FINDINGS: CTA NECK FINDINGS Aortic arch: Aortic arch within normal limits for caliber with standard branch pattern. Aortic atherosclerosis. No significant stenosis about the origin the great vessels. Right carotid system: Right common and internal carotid arteries are patent without dissection. Moderate calcified plaque about the right carotid bulb without hemodynamically significant greater than 50% stenosis. Left carotid system: Left common and internal carotid arteries are patent without dissection. Mild-to-moderate atheromatous plaque about the left carotid bulb without hemodynamically significant  greater than 50% stenosis. Vertebral arteries: Right vertebral artery dominant. Atheromatous plaque at the origin of the right vertebral artery with moderate to severe stenosis.  Vertebral arteries are otherwise patent without stenosis or dissection. Skeleton: No worrisome osseous lesions. Advanced spondylosis at C4-5 through C6-7. patient is edentulous. Osteoarthritic changes noted about the TMJs. Other neck: No other acute finding. Upper chest: No other acute finding. Review of the MIP images confirms the above findings CTA HEAD FINDINGS Anterior circulation: Moderate atheromatous change about the carotid siphons without hemodynamically significant stenosis. Note again made of a 3-4 mm outpouching extending posteriorly and inferiorly from the supraclinoid left ICA, which could reflect a small aneurysm versus vascular infundibulum (series 6, image 102). A similar 3-4 mm outpouching at the contralateral supraclinoid right ICA (series 6, image 103), which could also reflect a small infundibulum versus aneurysm. A1 segments patent bilaterally. Normal anterior communicating artery complex. Anterior cerebral arteries patent without significant stenosis. No M1 stenosis or occlusion. Normal MCA bifurcations. Distal MCA branches remain patent and perfused. Posterior circulation: Both V4 segments patent without stenosis. Neither PICA origin well visualized. Basilar patent without stenosis. Superior cerebellar and posterior cerebral arteries patent bilaterally. Venous sinuses: Not well assessed due to timing of the contrast bolus. Anatomic variants: As above. Review of the MIP images confirms the above findings IMPRESSION: 1. Negative CTA for large vessel occlusion or other emergent finding. 2. 3-4 mm outpouchings extending posteriorly and inferiorly from both supraclinoid ICAs, which could reflect small aneurysms versus vascular infundibula. Appearance is similar as compared to recent MRA from 12/30/2022. 3. Moderate atheromatous plaque about the carotid bifurcations and carotid siphons without hemodynamically significant stenosis. 4. Atheromatous plaque at the origin of the right vertebral artery  with moderate to severe stenosis. 5.  Aortic Atherosclerosis (ICD10-I70.0). Electronically Signed   By: Rise Mu M.D.   On: 01/13/2023 21:06   CT HEAD CODE STROKE WO CONTRAST Result Date: 01/13/2023 CLINICAL DATA:  Code stroke. Initial evaluation for neuro deficit, facial droop. EXAM: CT HEAD WITHOUT CONTRAST TECHNIQUE: Contiguous axial images were obtained from the base of the skull through the vertex without intravenous contrast. RADIATION DOSE REDUCTION: This exam was performed according to the departmental dose-optimization program which includes automated exposure control, adjustment of the mA and/or kV according to patient size and/or use of iterative reconstruction technique. COMPARISON:  Prior MRI from 12/30/2022 FINDINGS: Brain: Abnormal hypodensity related to recently identified right MCA distribution infarcts again seen, similar to prior. There has been interval development of scattered small volume subarachnoid hemorrhage within the right sylvian fissure. No significant mass effect. No other acute intracranial hemorrhage. No other acute large vessel territory infarct. No mass lesion or midline shift. No hydrocephalus or extra-axial fluid collection. Vascular: No abnormal hyperdense vessel. Skull: Scalp soft tissues within normal limits.  Calvarium intact. Sinuses/Orbits: Globes and orbital soft tissues within normal limits. Small volume pneumatized secretions noted within the left sphenoid sinus. Paranasal sinuses are otherwise clear. No mastoid effusion. Other: None. ASPECTS [facility] Stroke Program Early CT Score) - Ganglionic level infarction (caudate, lentiform nuclei, internal capsule, insula, M1-M3 cortex): 4 - Supraganglionic infarction (M4-M6 cortex): 3 Total score (0-10 with 10 being normal): 7 IMPRESSION: 1. Interval development of scattered small volume subarachnoid hemorrhage within the right Sylvian fissure. No significant mass effect. Upon review of recent MRA there is  question of a 3-4 mm outpouching at the takeoff of the right PCOM, which could potentially reflect a small aneurysm versus infundibulum, not appreciated on initial interpretation. This  finding is not certain, as no aneurysm is described at this location on recent arteriogram. 2. Evolving right MCA distribution infarcts, similar to prior. 3. Aspects is 7. Critical Value/emergent results were called by telephone at the time of interpretation on 01/13/2023 at 7:59 pm to provider DAVID WELLS , who verbally acknowledged these results. Electronically Signed   By: Rise Mu M.D.   On: 01/13/2023 20:11    Vitals:   01/14/23 0530 01/14/23 0600 01/14/23 0630 01/14/23 0636  BP: (!) 98/40 (!) 106/46 (!) 98/50 (!) 114/49  Pulse: 70 70 63 67  Resp: 19 (!) 23 (!) 24 (!) 27  Temp:      TempSrc:      SpO2: 99% 98% 95% 94%  Weight:         PHYSICAL EXAM General:  Alert, well-nourished, well-developed patient in no acute distress Psych:  Mood and affect appropriate for situation CV: Regular rate and rhythm on monitor Respiratory:  Regular, unlabored respirations on room air GI: Abdomen soft and nontender  Neurological Examination Mental Status: Awakens to voice. Able to give her name but unable to correctly answer the 2/5 orientation questions that she gave a response to. Able to protrude tongue and weakly squeeze examiner's hand to command. Severe dysarthria. Only with one-word attempts to answer questions. Did not respond when asked how she is feeling. Unable to test naming due to her abulia.  Cranial Nerves: II:  PERRL Blinks to threat on some trials in temporal field of each eye. Tracks examiner's face with constant coaching.   III,IV, VI: No ptosis. Hypometric saccades as well as saccadic visual pursuits noted. Eyes are conjugate.  V: Reacts to touch bilaterally  VII: Smiles symmetrically to command but there is a slight delay on the left.  VIII: Hearing intact to voice IX,X: Gag reflex  deferred XI: Unable to assess due to poor comprehension of commands XII: Midline tongue extension after multiple requests to protrude Motor/Sensory: RUE: Weakly grips examiner's hand. Elevates forearm antigravity. Unable to elevate at shoulder antigravity LUE: No movement to command or noxious RLE weak withdrawal to noxious plantar stimulation and able to flex at knee and hip by about 30 degrees. No antigravity movement. LLE: withdraws about 2 inches to noxious plantar stimulation  Cerebellar: Unable to assess Gait: Unable to assess    ASSESSMENT/PLAN  Subarachnoid Hemorrhage :  scattered SAH in right sylvian fissure Etiology:  eliquis   Code Stroke CT head - Interval development of scattered small volume subarachnoid hemorrhage within the right Sylvian fissure. No significant mass effect. Upon review of recent MRA there is question of a 3-4 mm outpouching at the takeoff of the right PCOM, which could potentially reflect a small aneurysm versus infundibulum, not appreciated on initial interpretation. This finding is not certain, as no aneurysm is described at this location on recent arteriogram. Evolving right MCA distribution infarcts, similar to prior. Aspects is 7. CTA head & neck -  3-4 mm outpouchings extending posteriorly and inferiorly from both supraclinoid ICAs, which could reflect small aneurysms versus vascular infundibula. Appearance is similar as compared to recent MRA from 12/30/2022. Moderate atheromatous plaque about the carotid bifurcations and carotid siphons without hemodynamically significant stenosis. Atheromatous plaque at the origin of the right vertebral artery with moderate to severe stenosis. LDL 33 HgbA1c 6.4 EEG- This study is suggestive of cortical dysfunction arising from right hemisphere likely secondary to underlying structural abnormality. Additionally there is moderate diffuse encephalopathy. No seizures were seen throughout the recording. Generalized sharp  transients were noted. Recommend long term monitoring for further evaluation VTE prophylaxis - SCDs Eliquis (apixaban) daily prior to admission, now on No antithrombotic  Therapy recommendations:  Pending Disposition:  Neuro ICU  Hx of Stroke/TIA 11/28-12/11- Acute Ischemic Infarct:  right MCA territory infarct with R M2 and A1 occlusion s/p IR with TICI 3 revascularization, etiology: Cardioembolic in the setting of A-fib  Code Stroke  CT head  -hyperdense right MCA, cytotoxic edema in right stratum and insula.  ASPECTS 7   CTA head & neck acute clot at the right terminal ICA and acute obstructing right MCA M2 and ACA A1. CT perfusion 6/149 in the right MCA territory.  S/p IR via right ICA direct access with TICI3 MRI scattered acute right MCA distribution infarcts involving the right caudate and lentiform nuclei, right insula, and overlying right cerebral hemisphere, small volume petechial hemorrhage at the posterior right frontal region MRA  with interval revascularization of previously seen clot at the right ICA terminus and right M2 segment, Focal moderate distal right P2 stenosis. 2D Echo EF 60 to 65%.  LV severe concentric hypertrophy.  LA severely dilated LDL 33 HgbA1c 6.4 VTE prophylaxis - Heparin IV Eliquis prior to admission, eliquis re-started 12/10 after PEG Therapy recommendations: SNF Deposition - SNF today   Abnormal EEG LTM connected Routine EEG 12/14 This study is suggestive of cortical dysfunction arising from right hemisphere likely secondary to underlying structural abnormality. Additionally there is moderate diffuse encephalopathy. No seizures were seen throughout the recording. Generalized sharp transients were noted. Recommend long term monitoring for further evaluation  Atrial fibrillation Home Meds: Eliquis Reversed with Kcentra at [facility] On tele Rate controlled On hold due to [facility]   Cerebral aneurysm now with [facility] CTA- -4 mm outpouchings extending posteriorly  and inferiorly from both supraclinoid ICAs, which could reflect small aneurysms versus vascular infundibula. Appearance is similar as compared to recent MRA from 12/30/2022. Moderate atheromatous plaque about the carotid bifurcations and carotid siphons without hemodynamically significant stenosis. Atheromatous plaque at the origin of the right vertebral artery with moderate to severe stenosis. Nimodipine 60mg  q4hr  Yearly MRA follow up as outpt   Hypertensive Emergency Home meds: Imdur 120 mg, olmesartan-amlodipine-HCTZ 40-10-12.5 mg, spironolactone 25 mg On Avapro 150 mg daily, spironolactone 12.5 Add amlodipine 10 and hydrochlorothiazide 12.5 BP goal: SBP 130-150 Long-term BP goal normotensive   Hyperlipidemia Home meds: Lipitor 40 mg, resumed in hospital LDL 33, goal < 70 Continue statin at discharge   Dysphagia Patient has post-stroke dysphagia, SLP consulted Continue NPO PEG Tube 12/9 Resume TF- Osmolite 1.2@55ml /hr- Initiate at 9ml/hr and increase by 63ml/hr q 8 hours until goal rate is each.  Free 30ml q4hr  Dietitian consulted   Other Stroke Risk Factors  Obstructive sleep apnea,  on CPAP at home  Advanced age   Other acute issues Left knee pain - lidocaine patch - arthritis in bilateral knees- f/u outpatient   Other Active Problems Leukocytosis WBC 17.5 -> 15.0 CXR, UA ordered  Trend Completed 7 days of ceftriaxone during last hospitalization due to aspiration pna Transaminitis 176/286 Trend  Hospital day # 0   Patient seen and examined by NP/APP with MD. MD to update note as needed.   Elmer Picker, DNP, FNP-BC [facility] Pager: [phone]  ATTENDING ATTESTATION:  EEG shows generalized and lateralized right hemisphere slowing with sharp transients. LTM overnight started. H/o afib-Con't to hold [facility] in setting of SAH.   Dr. Viviann Spare evaluated pt independently, reviewed imaging, chart, labs.  Discussed and formulated plan with the  Resident/APP. Changes were made to the note where appropriate. Please see APP/resident note above for details.      This patient is critically ill due to [facility], [facility] and at significant risk of neurological worsening, death form heart failure, respiratory failure, recurrent stroke, bleeding from [facility], seizure, sepsis. This patient's care requires constant monitoring of vital signs, hemodynamics, respiratory and cardiac monitoring, review of multiple databases, neurological assessment, discussion with family, other specialists and medical decision making of high complexity. I spent 35 minutes of neurocritical care time in the care of this patient.   Yeng Frankie,MD   To contact Stroke Continuity provider, please refer to [email or web address]. After hours, contact General Neurology

## 2023-01-14 NOTE — Progress Notes (Signed)
Initial Nutrition Assessment  DOCUMENTATION CODES:   Not applicable  INTERVENTION:   Tube Feeding via PEG: Change to Osmolite 1.2 at 55 ml/hr TF at goal provides 1584 kcals, 73 g of protein and 1069 mL of free water  Free water flushes per MD; Recommend 150 mLq 6 hours to meet hydration needs once appropriate   Nutrition poc depending upon diet advancement.   NUTRITION DIAGNOSIS:   Inadequate oral intake related to lethargy/confusion, acute illness, dysphagia as evidenced by NPO status.  GOAL:   Patient will meet greater than or equal to 90% of their needs   MONITOR:   TF tolerance, Diet advancement, Labs, Weight trends  REASON FOR ASSESSMENT:   Consult Enteral/tube feeding initiation and management  ASSESSMENT:   89 yo female with L sided weakness and confusion with SAH. Pt with recent discharge from the hospital after admission for acute R MCA infarct. Pt with hx of GERD, HTN. Pt has PEG tube in place  12/09 PEG placed 12/11 Discharged to SNF 12/14 Admitted with SAH  PEG tube in place; noted pt recently started taking foods by mouth prior to recent discharge from hospital. SLP consulted but currently pt remains NPO due to lethargy  Osmolite 1.5 at 55 ml/hr order today per MD  EEG today "suggestive of cortical dysfunction arising from R hemisphere likely 2/2 structural abnormality. There is moderate diffuse encephalopathy, no seizures noted. Generalized sharp transients were noted."  Current wt 64.3 kg  Unable to obtain diet and weight history from patient at this time  Labs: sodium 130 (L), potassium 5.4 (H), Creatinine wdl, BUN 47 (H), CBGs 129-138 Meds: Oscal with D, ferrous sulfate ,ss novolog,senokot-S, nimotop   NUTRITION - FOCUSED PHYSICAL EXAM:  Unable to assess  Diet Order:   Diet Order             Diet NPO time specified  Diet effective now                   EDUCATION NEEDS:   Not appropriate for education at this time  Skin:   Skin Assessment: Reviewed RN Assessment (no pressure injuries)  Last BM:  12/13  Height:   Ht Readings from Last 1 Encounters:  01/13/23 5\' 3"  (1.6 m)    Weight:   Wt Readings from Last 1 Encounters:  01/14/23 64.3 kg    BMI:  Body mass index is 25.11 kg/m.  Estimated Nutritional Needs:   Kcal:  1450-1650 kcals  Protein:  70-80 g  Fluid:  >/= 1.5 L   Romelle Starcher MS, RDN, LDN, CNSC Registered Dietitian 3 Clinical Nutrition RD Inpatient Contact Info in Amion

## 2023-01-14 NOTE — H&P (Signed)
NEUROLOGY H&P NOTE   Date of service: January 14, 2023 Patient Name: Jocelyn [name] MRN:  [id number] DOB:  02/14/1931 Chief Complaint: Worsened left sided weakness  History of Present Illness  Jocelyn [name] is an 89 y.o. female  has a past medical history of Arthritis of both knees (08/01/2015), Colitis, Essential hypertension, benign (07/28/2014), GERD (gastroesophageal reflux disease), iron deficiency anemia (09/02/2015), Hyperlipidemia, Hypertension, Lumbar pain, Medicare annual wellness visit, subsequent (04/07/2017), OSA on CPAP (08/01/2015), Overweight (BMI 25.0-29.9) (07/10/2013), Prediabetes (09/02/2015), and SOB (shortness of breath) (07/10/2013)., atrial fibrillation (on Eliquis) who was recently discharged from the Stroke service at [facility] on 12/11 after management of an acute right MCA territory infarct in the setting of atrial fibrillation. She had presented at that time with left-sided weakness and confusion. Imaging had revealed a right A1 and M2 occlusion and she underwent successful mechanical thrombectomy, with TICI 3 revascularization. which was successful.  Follow up MRI after VIR, showed scattered acute right MCA distribution infarcts involving the right caudate and lentiform nuclei, right insula, and overlying right cerebral hemisphere, small volume petechial hemorrhage at the posterior right frontal region. MRA showed interval revascularization of previously seen clot at the right ICA terminus and right M2 segment; focal moderate distal right P2 stenosis. 2D Echo showed EF 60 to 65%, LV severe concentric hypertrophy, LA severely dilated.  Her Eliquis was restarted on 12/10 for her chronic A-fib. She was initially unable to pass her swallow study, and PEG tube was inserted on 12/9 with tube feeding started, although patient did pass swallow study afterwards and was beginning to take foods by mouth at the time of her discharge. She did have some pain in her left knee, which was successfully  treated with lidocaine patches. She was discharged to a SNF.  She re-presented to the ED at [facility] on Friday from [facility] rehab facility for acute stroke symptoms. LKN was 1830. She had been at [facility] for LUE weakness. Per EMS she had new onset L side facial droop, numbness to L arm, and significantly increased weakness to L arm. CTH showed interval development of scattered subarachnoid hemorrhage in the right sylvian fissure. Teleneurology was consulted. NIHSS was 17. Teleneurology advised DOAC reversal with KCentra, BP management, EEG for observed facial twitching and admission as well as Neurosurgery consult. Neurosurgery recommended Keppra 500 mg. CTA showed no LVO; atheromatous plaque was again noted. 3-4 mm outpouchings extending posteriorly and inferiorly from both supraclinoid ICAs were noted, which Radiology felt could reflect small aneurysms versus vascular infundibula; the outpouchings were similar in appearance compared to recent MRA from 12/30/2022.  She has been transferred to the [facility] Neuro ICU for further management.       Last known well: 1830 Modified rankin score: 4-Needs assistance to walk and tend to bodily needs ICH Score: 1 NIHSS: 17     ROS  Unable to perform due to dysphasia and abulia.   Past History   Past Medical History:  Diagnosis Date   Arthritis of both knees 08/01/2015   Colitis    Essential hypertension, benign 07/28/2014   GERD (gastroesophageal reflux disease)    Hx of iron deficiency anemia 09/02/2015   Hyperlipidemia    Hypertension    Lumbar pain    Medicare annual wellness visit, subsequent 04/07/2017   OSA on CPAP 08/01/2015   Overweight (BMI 25.0-29.9) 07/10/2013   Prediabetes 09/02/2015   SOB (shortness of breath) 07/10/2013   Past Surgical History:  Procedure Laterality Date   ABDOMINAL HYSTERECTOMY  CATARACT EXTRACTION     CHOLECYSTECTOMY     COLONOSCOPY WITH PROPOFOL N/A 04/09/2018   Procedure: COLONOSCOPY WITH PROPOFOL;  Surgeon: Toney Reil, MD;  Location: [facility] ENDOSCOPY;  Service: Gastroenterology;  Laterality: N/A;   ESOPHAGOGASTRODUODENOSCOPY N/A 01/09/2023   Procedure: ESOPHAGOGASTRODUODENOSCOPY (EGD);  Surgeon: Diamantina Monks, MD;  Location: [facility] ENDOSCOPY;  Service: General;  Laterality: N/A;   IR CT HEAD LTD  12/29/2022   IR PERCUTANEOUS ART THROMBECTOMY/INFUSION INTRACRANIAL INC DIAG ANGIO  12/29/2022   IR US GUIDE VASC ACCESS RIGHT  12/29/2022   IR US GUIDE VASC ACCESS RIGHT  12/29/2022   LUMBAR DISC SURGERY     PEG PLACEMENT N/A 01/09/2023   Procedure: PERCUTANEOUS ENDOSCOPIC GASTROSTOMY (PEG) PLACEMENT;  Surgeon: Diamantina Monks, MD;  Location: [facility] ENDOSCOPY;  Service: General;  Laterality: N/A;   RADIOLOGY WITH ANESTHESIA N/A 12/29/2022   Procedure: IR WITH ANESTHESIA;  Surgeon: Radiologist, Medication, MD;  Location: [facility] OR;  Service: Radiology;  Laterality: N/A;   Family History  Problem Relation Age of Onset   Diabetes Mother    Heart disease Mother    Cancer Father        lung; smoker   Hypertension Daughter    Diabetes Daughter    Cancer Daughter        breast   Anuerysm Son    Breast cancer Neg Hx    Social History   Socioeconomic History   Marital status: Widowed    Spouse name: Not on file   Number of children: 5   Years of education: Not on file   Highest education level: Not on file  Occupational History   Not on file  Tobacco Use   Smoking status: Never   Smokeless tobacco: Never  Vaping Use   Vaping status: Never Used  Substance and Sexual Activity   Alcohol use: No    Alcohol/week: 0.0 standard drinks of alcohol   Drug use: No   Sexual activity: Not Currently  Other Topics Concern   Not on file  Social History Narrative   Not on file   Social Drivers of Health   Financial Resource Strain: Medium Risk (07/26/2018)   Overall Financial Resource Strain (CARDIA)    Difficulty of Paying Living Expenses: Somewhat hard  Food Insecurity: Patient Unable To Answer  (12/30/2022)   Hunger Vital Sign    Worried About Running Out of Food in the Last Year: Patient unable to answer    Ran Out of Food in the Last Year: Patient unable to answer  Transportation Needs: No Transportation Needs (12/30/2022)   PRAPARE - Administrator, Civil Service (Medical): No    Lack of Transportation (Non-Medical): No  Physical Activity: Inactive (07/26/2018)   Exercise Vital Sign    Days of Exercise per Week: 0 days    Minutes of Exercise per Session: 0 min  Stress: No Stress Concern Present (07/26/2018)   [employer] of Occupational Health - Occupational Stress Questionnaire    Feeling of Stress : Not at all  Social Connections: Moderately Isolated (07/26/2018)   Social Connection and Isolation Panel [NHANES]    Frequency of Communication with Friends and Family: More than three times a week    Frequency of Social Gatherings with Friends and Family: Three times a week    Attends Religious Services: More than 4 times per year    Active Member of Clubs or Organizations: No    Attends Banker Meetings: Never  Marital Status: Widowed   No Known Allergies  Medications   Medications Prior to Admission  Medication Sig Dispense Refill Last Dose/Taking   acetaminophen (TYLENOL) 325 MG tablet Take 2 tablets (650 mg total) by mouth every 4 (four) hours as needed for mild pain (pain score 1-3) (or temp > 37.5 C (99.5 F)). 30 tablet 0    alendronate (FOSAMAX) 70 MG tablet Take 70 mg by mouth once a week.      amLODipine (NORVASC) 10 MG tablet Place 1 tablet (10 mg total) into feeding tube daily. 30 tablet 0    apixaban (ELIQUIS) 5 MG TABS tablet Place 1 tablet (5 mg total) into feeding tube 2 (two) times daily. 60 tablet 0    atorvastatin (LIPITOR) 40 MG tablet Place 1 tablet (40 mg total) into feeding tube daily. 30 tablet 0    calcium citrate-vitamin D 500-400 MG-UNIT chewable tablet Chew 1 tablet by mouth 2 (two) times daily.       famotidine  (PEPCID) 20 MG tablet Place 1 tablet (20 mg total) into feeding tube daily. 30 tablet 0    ferrous sulfate 325 (65 FE) MG tablet Take 1 tablet (325 mg total) by mouth daily. 30 tablet 11    hydrochlorothiazide (HYDRODIURIL) 12.5 MG tablet Place 1 tablet (12.5 mg total) into feeding tube daily. 30 tablet 0    HYDROcodone-acetaminophen (NORCO/VICODIN) 5-325 MG tablet Take 1 tablet by mouth every 6 (six) hours as needed for moderate pain (pain score 4-6). 30 tablet 0    ipratropium-albuterol (DUONEB) 0.5-2.5 (3) MG/3ML SOLN Take 3 mLs by nebulization every 6 (six) hours as needed. 360 mL 0    irbesartan (AVAPRO) 150 MG tablet Place 1 tablet (150 mg total) into feeding tube daily. 30 tablet 0    lidocaine (LIDODERM) 5 % Place 2 patches onto the skin daily. Remove & Discard patch within 12 hours or as directed by MD 30 patch 0    Nutritional Supplements (FEEDING SUPPLEMENT, OSMOLITE 1.2 CAL,) LIQD Place 1,000 mLs into feeding tube continuous. 1000 mL 0    nystatin (MYCOSTATIN) 100000 UNIT/ML suspension Take 5 mLs (500,000 Units total) by mouth 4 (four) times daily. 60 mL 0    spironolactone (ALDACTONE) 25 MG tablet Place 0.5 tablets (12.5 mg total) into feeding tube daily. 30 tablet 0    Water For Irrigation, Sterile (FREE WATER) SOLN Place 100 mLs into feeding tube every 4 (four) hours. 100 mL 0    white petrolatum (VASELINE) OINT Apply 1 Application topically as needed for lip care. 1 packet 0      Vitals   Vitals:   01/14/23 0112  BP: (!) 142/91  Pulse: 95  Resp: (!) 23  Temp: 99.8 F (37.7 C)  TempSrc: Oral  SpO2: 95%     There is no height or weight on file to calculate BMI.  Physical Exam   Physical Exam  HEENT-  [place]/AT    Lungs- Respirations unlabored Extremities- No edema  Neurological Examination Mental Status: Awakens to voice. Able to give her name but unable to correctly answer the 2/5 orientation questions that she gave a response to. Able to protrude tongue and weakly  squeeze examiner's hand to command. Severe dysarthria. Only with one-word attempts to answer questions. Did not respond when asked how she is feeling. Unable to test naming due to her abulia.  Cranial Nerves: II:  PERRL Blinks to threat on some trials in temporal field of each eye. Tracks examiner's face with constant  coaching.   III,IV, VI: No ptosis. Hypometric saccades as well as saccadic visual pursuits noted. Eyes are conjugate.  V: Reacts to touch bilaterally  VII: Smiles symmetrically to command but there is a slight delay on the left.  VIII: Hearing intact to voice IX,X: Gag reflex deferred XI: Unable to assess due to poor comprehension of commands XII: Midline tongue extension after multiple requests to protrude Motor/Sensory: RUE: Weakly grips examiner's hand. Elevates forearm antigravity. Unable to elevate at shoulder antigravity LUE: No movement to command or noxious RLE weak withdrawal to noxious plantar stimulation and able to flex at knee and hip by about 30 degrees. No antigravity movement. LLE: withdraws about 2 inches to noxious plantar stimulation  Cerebellar: Unable to assess Gait: Unable to assess  Labs   CBC:  Recent Labs  Lab 01/11/23 0551 01/13/23 2020  WBC 17.5* 15.0*  NEUTROABS  --  12.7*  HGB 9.5* 9.8*  HCT 28.2* 30.6*  MCV 82.7 84.5  PLT 308 226    Basic Metabolic Panel:  Lab Results  Component Value Date   NA 130 (L) 01/13/2023   K 5.4 (H) 01/13/2023   CO2 24 01/13/2023   GLUCOSE 167 (H) 01/13/2023   BUN 47 (H) 01/13/2023   CREATININE 0.79 01/13/2023   CALCIUM 8.4 (L) 01/13/2023   GFRNONAA >60 01/13/2023   GFRAA 78 08/01/2018   Lipid Panel:  Lab Results  Component Value Date   LDLCALC 33 12/30/2022   HgbA1c:  Lab Results  Component Value Date   HGBA1C 6.4 (H) 12/30/2022   Urine Drug Screen:     Component Value Date/Time   LABOPIA NONE DETECTED 12/29/2022 1524   COCAINSCRNUR NONE DETECTED 12/29/2022 1524   LABBENZ NONE DETECTED  12/29/2022 1524   AMPHETMU NONE DETECTED 12/29/2022 1524   THCU NONE DETECTED 12/29/2022 1524   LABBARB NONE DETECTED 12/29/2022 1524    Alcohol Level     Component Value Date/Time   ETH <10 01/13/2023 2020   INR  Lab Results  Component Value Date   INR 1.6 (H) 01/13/2023   APTT  Lab Results  Component Value Date   APTT 34 01/13/2023   Imaging studies have been personally reviewed and are summarized in the HPI.     Impression  Jocelyn [name] is a 89 y.o. female who re-presents with right parietotemporal predominantly subarachnoid hemorrhage with a small parenchymal component at the location of one of her recent strokes, while on Eliquis. Discharged on 12/11 after admission for right hemisphere strokes.  - Exam reveals abulia, dysarthria, aphasia, plegic LUE, severe LLE weakness and moderate to severe RUE and RLE weakness.  - CT head obtained at [facility]: Interval development of scattered small volume subarachnoid hemorrhage within the right Sylvian fissure. Evolving right MCA distribution infarcts, similar to prior. Upon review of recent MRA there is "question of a 3-4 mm outpouching at the takeoff of the right PCOM", per Radiology, which could potentially reflect a small aneurysm versus infundibulum, not appreciated on initial interpretation. However, on review of images by Neurology there does not appear to be a significant abnormality at this location.  - CTA of head and neck at [facility] shows no finding to account for the new subarachoid hemorrhage: Negative for LVO. Unchanged relative to prior MRA from 11/29 are 3-4 mm outpouchings extending posteriorly and inferiorly from both supraclinoid ICAs, which could reflect small aneurysms versus vascular infundibula. Moderate atheromatous plaque about the carotid bifurcations and carotid siphons without hemodynamically significant stenosis. Atheromatous plaque  at the origin of the right vertebral artery with moderate to severe stenosis. Aortic  atherosclerosis  - Labs: - Na low at 130, K elevated at 5.4 - Glucose 167 - BUN elevated at 47, Cr normal at 0.79, suggestive of volume depletion - Ca 8.4 in the context of low albumin of 2.4 - AST and ALT elevated at 176 and 286.  - Elevated WBC of 15 (slightly down from 17.5 on 12/11), neutrophilic - Hgb 9.8 (essentially unchanged from 12/11) - Overall impression: Further hemorrhagic conversion of recent ischemic infarction in right perisylvian region, with predominant subarachnoid component. Of note, prior MRI last admission showed a small amount of petechial hemorrhage in this region on the gradient-echo images.    Recommendations  1. Admit to ICU under Neurology service 2. MRI of head 3. Cardiac telemetry 4. DOAC has been stopped 5. Administered KCentra at [facility] 6. PT consult, OT consult, Speech consult 7. Frequent neuro checks 8. BP management with clevidipine drip. SBP goal 140-160  9. Nimodipine 60 mg per PEG q4h x 21 days. .  10. No antiplatelet medications or anticoagulants. DVT prophylaxis with SCDs 11. IV NS at 100 cc/hr x 24 hours. Re-evaluate BUN and Cr in 24 hours.  12. Was administered Keppra at [facility], but no seizure activity was noted.  13. EEG (ordered, per Teleneurology recommendation). If positive for electrographic seizure activity or a seizure focus, will resume Keppra.   ______________________________________________________________________   Dessa Phi, Amel Gianino, MD [facility]

## 2023-01-15 ENCOUNTER — Inpatient Hospital Stay (HOSPITAL_COMMUNITY): Payer: 59

## 2023-01-15 LAB — MAGNESIUM: Magnesium: 2.5 mg/dL — ABNORMAL HIGH (ref 1.7–2.4)

## 2023-01-15 LAB — BASIC METABOLIC PANEL
Anion gap: 9 (ref 5–15)
BUN: 47 mg/dL — ABNORMAL HIGH (ref 8–23)
CO2: 20 mmol/L — ABNORMAL LOW (ref 22–32)
Calcium: 8.8 mg/dL — ABNORMAL LOW (ref 8.9–10.3)
Chloride: 108 mmol/L (ref 98–111)
Creatinine, Ser: 1.24 mg/dL — ABNORMAL HIGH (ref 0.44–1.00)
GFR, Estimated: 42 mL/min — ABNORMAL LOW (ref >60)
Glucose, Bld: 156 mg/dL — ABNORMAL HIGH (ref 70–99)
Potassium: 5.2 mmol/L — ABNORMAL HIGH (ref 3.5–5.1)
Sodium: 137 mmol/L (ref 135–145)

## 2023-01-15 LAB — CBC
HCT: 29.9 % — ABNORMAL LOW (ref 36.0–46.0)
Hemoglobin: 9.8 g/dL — ABNORMAL LOW (ref 12.0–15.0)
MCH: 27.6 pg (ref 26.0–34.0)
MCHC: 32.8 g/dL (ref 30.0–36.0)
MCV: 84.2 fL (ref 80.0–100.0)
Platelets: 263 10*3/uL (ref 150–400)
RBC: 3.55 MIL/uL — ABNORMAL LOW (ref 3.87–5.11)
RDW: 14.6 % (ref 11.5–15.5)
WBC: 19.2 10*3/uL — ABNORMAL HIGH (ref 4.0–10.5)
nRBC: 0 % (ref 0.0–0.2)

## 2023-01-15 LAB — COMPREHENSIVE METABOLIC PANEL
ALT: 208 U/L — ABNORMAL HIGH (ref 0–44)
AST: 123 U/L — ABNORMAL HIGH (ref 15–41)
Albumin: 1.9 g/dL — ABNORMAL LOW (ref 3.5–5.0)
Alkaline Phosphatase: 84 U/L (ref 38–126)
Anion gap: 10 (ref 5–15)
BUN: 47 mg/dL — ABNORMAL HIGH (ref 8–23)
CO2: 18 mmol/L — ABNORMAL LOW (ref 22–32)
Calcium: 8.5 mg/dL — ABNORMAL LOW (ref 8.9–10.3)
Chloride: 107 mmol/L (ref 98–111)
Creatinine, Ser: 0.86 mg/dL (ref 0.44–1.00)
GFR, Estimated: >60 mL/min (ref >60)
Glucose, Bld: 205 mg/dL — ABNORMAL HIGH (ref 70–99)
Potassium: 4.6 mmol/L (ref 3.5–5.1)
Sodium: 135 mmol/L (ref 135–145)
Total Bilirubin: 0.9 mg/dL (ref <1.2)
Total Protein: 6.6 g/dL (ref 6.5–8.1)

## 2023-01-15 LAB — GLUCOSE, CAPILLARY
Glucose-Capillary: 129 mg/dL — ABNORMAL HIGH (ref 70–99)
Glucose-Capillary: 157 mg/dL — ABNORMAL HIGH (ref 70–99)
Glucose-Capillary: 164 mg/dL — ABNORMAL HIGH (ref 70–99)
Glucose-Capillary: 164 mg/dL — ABNORMAL HIGH (ref 70–99)
Glucose-Capillary: 138 mg/dL — ABNORMAL HIGH (ref 70–99)
Glucose-Capillary: 119 mg/dL — ABNORMAL HIGH (ref 70–99)

## 2023-01-15 LAB — PHOSPHORUS: Phosphorus: 4.2 mg/dL (ref 2.5–4.6)

## 2023-01-15 MED ORDER — FREE WATER
100.0000 mL | Status: DC
  Administered 2023-01-16 – 2023-01-21 (×29): 100 mL

## 2023-01-15 MED ORDER — DIATRIZOATE MEGLUMINE & SODIUM 66-10 % PO SOLN
ORAL | Status: AC
Start: 2023-01-15 — End: ?
  Filled 2023-01-15: qty 30

## 2023-01-15 MED ORDER — IOHEXOL 350 MG/ML SOLN
75.0000 mL | Freq: Once | INTRAVENOUS | Status: AC | PRN
  Administered 2023-01-15: 75 mL via INTRAVENOUS

## 2023-01-15 MED ORDER — LIDOCAINE-EPINEPHRINE (PF) 2 %-1:200000 IJ SOLN
INTRAMUSCULAR | Status: AC
  Filled 2023-01-15: qty 20

## 2023-01-15 MED ORDER — IOHEXOL 9 MG/ML PO SOLN
ORAL | Status: AC
  Filled 2023-01-15: qty 1000

## 2023-01-15 MED ORDER — DIATRIZOATE MEGLUMINE & SODIUM 66-10 % PO SOLN
30.0000 mL | Freq: Once | ORAL | Status: AC
  Administered 2023-01-15: 30 mL
  Filled 2023-01-15: qty 30

## 2023-01-15 MED ORDER — LIDOCAINE HCL (PF) 1 % IJ SOLN
INTRAMUSCULAR | Status: AC
  Filled 2023-01-15: qty 30

## 2023-01-15 MED ORDER — IOHEXOL 12 MG/ML PO SOLN
500.0000 mL | ORAL | Status: AC
  Administered 2023-01-15 (×2): 500 mL via ORAL

## 2023-01-15 MED ORDER — LIDOCAINE 5 % EX PTCH
2.0000 | MEDICATED_PATCH | Freq: Every day | CUTANEOUS | Status: DC
  Administered 2023-01-15 – 2023-02-07 (×24): 2 via TRANSDERMAL
  Filled 2023-01-15 (×24): qty 2

## 2023-01-15 NOTE — Progress Notes (Signed)
LTM maint complete - no skin breakdown seen .  Serviced leads. [facility] monitored, Event button test confirmed by [facility].

## 2023-01-15 NOTE — Evaluation (Signed)
Physical Therapy Evaluation Patient Details Name: Jocelyn [name] MRN: [id number] DOB: April 22, 1931 Today's Date: 01/15/2023  History of Present Illness  Pt is an 89 y.o. female who presented to the ED 12/13 due to LUE weakness, left facial droop and CTH showed development of scattered SAH. Recent hospitalization 11/27-12/11 due to R MCA ischemic stroke for which she underwent IR thrombectomy and PEG placement.  She was discharged to [facility] 12/11. PMH: HTN, CHF, a fib with RVR on eliquis, HLD, OSA   Clinical Impression  Pt admitted with above diagnosis. PTA pt at SNF for rehab following recent hospitalization for R MCA infarct. Pt currently with functional limitations due to the deficits listed below (see PT Problem List). On eval, pt presenting with bilat UE/LE weakness, L>R and UE>LE. Active movement noted in all 4 extremities with LUE having greatest deficits. She required max/total assist bed mobility and demo poor sitting balance. She followed simple commands with increased time. Pt nodding and smiling but no verbalizations during session. Pt undergoing LTM EEG at time of eval. Pt will benefit from acute skilled PT to increase their independence and safety with mobility to allow discharge. Upon d/c, recommend return to SNF for further therapy.          If plan is discharge home, recommend the following: Two people to help with walking and/or transfers;Direct supervision/assist for medications management;Assist for transportation;Two people to help with bathing/dressing/bathroom;Assistance with cooking/housework;Assistance with feeding;Direct supervision/assist for financial management;Help with stairs or ramp for entrance;Supervision due to cognitive status   Can travel by private vehicle   No    Equipment Recommendations None recommended by PT  Recommendations for Other Services       Functional Status Assessment Patient has had a recent decline in their functional status and demonstrates  the ability to make significant improvements in function in a reasonable and predictable amount of time.     Precautions / Restrictions Precautions Precautions: Fall;Other (comment) Precaution Comments: PEG tube      Mobility  Bed Mobility Overal bed mobility: Needs Assistance Bed Mobility: Rolling, Sidelying to Sit, Sit to Supine Rolling: Max assist, Used rails Sidelying to sit: Max assist, HOB elevated, Used rails Supine to sit: Total assist     General bed mobility comments: assist for all aspects with decreased initiation    Transfers                        Ambulation/Gait                  Stairs            Wheelchair Mobility     Tilt Bed    Modified Rankin (Stroke Patients Only)       Balance Overall balance assessment: Needs assistance Sitting-balance support: Bilateral upper extremity supported, Feet supported Sitting balance-Leahy Scale: Poor Sitting balance - Comments: assist to attain midline. Only able to sustain a few seconds. Postural control: Posterior lean, Left lateral lean                                   Pertinent Vitals/Pain Pain Assessment Pain Assessment: Faces Faces Pain Scale: Hurts little more Pain Location: abdomen Pain Intervention(s): Monitored during session, Repositioned, Limited activity within patient's tolerance    Home Living Family/patient expects to be discharged to:: Skilled nursing facility  Additional Comments: Plan is for return to SNF for rehab    Prior Function                       Extremity/Trunk Assessment   Upper Extremity Assessment Upper Extremity Assessment: Defer to OT evaluation (weakness noted bilat, L>R, edema noted L hand)    Lower Extremity Assessment Lower Extremity Assessment: Generalized weakness;LLE deficits/detail RLE Deficits / Details: Pt with c/o arthritic knee pain bilat, R>L LLE Deficits / Details: grossly 3/5  hip/knee    Cervical / Trunk Assessment Cervical / Trunk Assessment: Kyphotic  Communication   Communication Communication: Difficulty communicating thoughts/reduced clarity of speech;Difficulty following commands/understanding  Cognition Arousal: Alert Behavior During Therapy: Flat affect Overall Cognitive Status: Impaired/Different from baseline Area of Impairment: Following commands, Safety/judgement, Problem solving, Memory, Attention                   Current Attention Level: Sustained Memory: Decreased short-term memory Following Commands: Follows one step commands with increased time, Follows one step commands consistently Safety/Judgement: Decreased awareness of safety, Decreased awareness of deficits   Problem Solving: Slow processing, Decreased initiation, Difficulty sequencing, Requires verbal cues, Requires tactile cues General Comments: Smiling and nodding but no verbalizations during session.        General Comments General comments (skin integrity, edema, etc.): SpO2 97% on 2L. HR in 80s in a fib.    Exercises     Assessment/Plan    PT Assessment Patient needs continued PT services  PT Problem List Decreased strength;Decreased balance;Decreased cognition;Decreased mobility;Decreased activity tolerance;Decreased knowledge of use of DME;Pain       PT Treatment Interventions DME instruction;Therapeutic activities;Cognitive remediation;Therapeutic exercise;Patient/family education;Balance training;Functional mobility training;Neuromuscular re-education    PT Goals (Current goals can be found in the Care Plan section)  Acute Rehab PT Goals Patient Stated Goal: not stated PT Goal Formulation: With patient/family Time For Goal Achievement: 01/29/23 Potential to Achieve Goals: Fair    Frequency Min 1X/week     Co-evaluation               AM-PAC PT "6 Clicks" Mobility  Outcome Measure Help needed turning from your back to your side while in a  flat bed without using bedrails?: Total Help needed moving from lying on your back to sitting on the side of a flat bed without using bedrails?: Total Help needed moving to and from a bed to a chair (including a wheelchair)?: Total Help needed standing up from a chair using your arms (e.g., wheelchair or bedside chair)?: Total Help needed to walk in hospital room?: Total Help needed climbing 3-5 steps with a railing? : Total 6 Click Score: 6    End of Session Equipment Utilized During Treatment: Oxygen Activity Tolerance: Patient tolerated treatment well Patient left: in bed;with call bell/phone within reach;with family/visitor present Nurse Communication: Mobility status PT Visit Diagnosis: Other abnormalities of gait and mobility (R26.89);Hemiplegia and hemiparesis;Other symptoms and signs involving the nervous system (R29.898) Hemiplegia - Right/Left: Left Hemiplegia - dominant/non-dominant: Non-dominant Hemiplegia - caused by: Cerebral infarction    Time: 1216-1237 PT Time Calculation (min) (ACUTE ONLY): 21 min   Charges:   PT Evaluation $PT Eval Moderate Complexity: 1 Mod   PT General Charges $$ ACUTE PT VISIT: 1 Visit         Ferd Glassing., PT  Office # [phone]   Ilda Foil 01/15/2023, 2:29 PM

## 2023-01-15 NOTE — Progress Notes (Signed)
1st bottle contrast given 1811

## 2023-01-15 NOTE — Progress Notes (Signed)
LTM EEG disconnected - no skin breakdown at unhook.  

## 2023-01-15 NOTE — Progress Notes (Signed)
2nd bottle completed at 2030

## 2023-01-15 NOTE — Procedures (Signed)
Patient Name: Jocelyn [name]  MRN: [id number]  Epilepsy Attending: Charlsie Quest  Referring Physician/Provider: Harolyn Rutherford, MD  Duration: 01/15/2023 1104 to 01/15/2023 1427   Patient history:  89 y.o. female who re-presents with right parietotemporal predominantly subarachnoid hemorrhage with a small parenchymal component at the location of one of her recent strokes, while on Eliquis. Discharged on 12/11 after admission for right hemisphere strokes. EEG to evaluate for seizure   Level of alertness: Awake, asleep   AEDs during EEG study: None   Technical aspects: This EEG study was done with scalp electrodes positioned according to the 10-20 International system of electrode placement. Electrical activity was reviewed with band pass filter of 1-70Hz , sensitivity of 7 uV/mm, display speed of 46mm/sec with a 60Hz  notched filter applied as appropriate. EEG data were recorded continuously and digitally stored.  Video monitoring was available and reviewed as appropriate.   Description: The posterior dominant rhythm consists of 7.5 Hz activity of moderate voltage (25-35 uV) seen predominantly in posterior head regions, symmetric and reactive to eye opening and eye closing. Sleep was characterized by vertex waves, sleep spindles (12 to 14 Hz), maximal frontocentral region. EEG showed continuous generalized and lateralized right hemisphere 3 to 6 Hz theta-delta slowing. Generalized and maximal posterior quadrant sharp transients with triphasic morphology were noted intermittently. Hyperventilation and photic stimulation were not performed.      ABNORMALITY - Continuous slow, generalized and lateralized right hemisphere   IMPRESSION: This study is suggestive of cortical dysfunction arising from right hemisphere likely secondary to underlying structural abnormality. Additionally there is moderate diffuse encephalopathy. No seizures were seen throughout the recording.  Jocelyn [name]

## 2023-01-15 NOTE — Consult Note (Signed)
Jocelyn [name] Feb 04, 1931  [id number].    Requesting MD: Pamalee Leyden, NP;  Chief Complaint/Reason for Consult: concern for abdominal wall infection after PEG procedure  HPI:  Jocelyn [name] is a 89 y/o F with MMP listed below, known to our service after PEG placement 12/9 by Dr. Bedelia Person due to dysphagia from recent R MCA ischemic stroke s/p IR thrombectomy. She was discharged to SNF on 12/11. She has been on Eliquis for a.fib. She presented to the ED 12/13 due to LUE weakness, left facial droop and CTH showed development of scattered SAH. Eliquis was reversed and the patient was admitted for further management. The admitting team noted some erythema and tenderness around her PEG tube and have asked use to evaluate. History provided by primary team and bedside RN. Patient has had low grade fevers and rising leukocytosis without any identifiable source of infection with workup. Bedside RN noted today some possible purulence around Gtube insertion site. I am unable to appreciate any purulence on my exam or express any purulence but noted what appeared to be possible purulence on old drain sponge provided by RN. Patient's abdomen is very soft and nondistended but patient endorses abdominal pain throughout.  ROS: As above   Family History  Problem Relation Age of Onset   Diabetes Mother    Heart disease Mother    Cancer Father        lung; smoker   Hypertension Daughter    Diabetes Daughter    Cancer Daughter        breast   Anuerysm Son    Breast cancer Neg Hx     Past Medical History:  Diagnosis Date   Arthritis of both knees 08/01/2015   Colitis    Essential hypertension, benign 07/28/2014   GERD (gastroesophageal reflux disease)    Hx of iron deficiency anemia 09/02/2015   Hyperlipidemia    Hypertension    Lumbar pain    Medicare annual wellness visit, subsequent 04/07/2017   OSA on CPAP 08/01/2015   Overweight (BMI 25.0-29.9) 07/10/2013   Prediabetes 09/02/2015   SOB (shortness of  breath) 07/10/2013    Past Surgical History:  Procedure Laterality Date   ABDOMINAL HYSTERECTOMY     CATARACT EXTRACTION     CHOLECYSTECTOMY     COLONOSCOPY WITH PROPOFOL N/A 04/09/2018   Procedure: COLONOSCOPY WITH PROPOFOL;  Surgeon: Toney Reil, MD;  Location: [facility] ENDOSCOPY;  Service: Gastroenterology;  Laterality: N/A;   ESOPHAGOGASTRODUODENOSCOPY N/A 01/09/2023   Procedure: ESOPHAGOGASTRODUODENOSCOPY (EGD);  Surgeon: Diamantina Monks, MD;  Location: [facility] ENDOSCOPY;  Service: General;  Laterality: N/A;   IR CT HEAD LTD  12/29/2022   IR PERCUTANEOUS ART THROMBECTOMY/INFUSION INTRACRANIAL INC DIAG ANGIO  12/29/2022   IR US GUIDE VASC ACCESS RIGHT  12/29/2022   IR US GUIDE VASC ACCESS RIGHT  12/29/2022   LUMBAR DISC SURGERY     PEG PLACEMENT N/A 01/09/2023   Procedure: PERCUTANEOUS ENDOSCOPIC GASTROSTOMY (PEG) PLACEMENT;  Surgeon: Diamantina Monks, MD;  Location: [facility] ENDOSCOPY;  Service: General;  Laterality: N/A;   RADIOLOGY WITH ANESTHESIA N/A 12/29/2022   Procedure: IR WITH ANESTHESIA;  Surgeon: Radiologist, Medication, MD;  Location: [facility] OR;  Service: Radiology;  Laterality: N/A;    Social History:  reports that she has never smoked. She has never used smokeless tobacco. She reports that she does not drink alcohol and does not use drugs.  Allergies: No Known Allergies  Medications Prior to Admission  Medication Sig Dispense Refill  acetaminophen (TYLENOL) 325 MG tablet Take 2 tablets (650 mg total) by mouth every 4 (four) hours as needed for mild pain (pain score 1-3) (or temp > 37.5 C (99.5 F)). (Patient taking differently: Place 650 mg into feeding tube every 4 (four) hours as needed for mild pain (pain score 1-3) (or temp > 37.5 C (99.5 F)).) 30 tablet 0   amLODipine (NORVASC) 10 MG tablet Place 1 tablet (10 mg total) into feeding tube daily. 30 tablet 0   apixaban (ELIQUIS) 5 MG TABS tablet Place 1 tablet (5 mg total) into feeding tube 2 (two) times daily. 60 tablet 0    atorvastatin (LIPITOR) 40 MG tablet Place 1 tablet (40 mg total) into feeding tube daily. 30 tablet 0   calcium citrate-vitamin D 500-400 MG-UNIT chewable tablet Place 1 tablet into feeding tube 2 (two) times daily.     famotidine (PEPCID) 20 MG tablet Place 1 tablet (20 mg total) into feeding tube daily. 30 tablet 0   ferrous sulfate 300 (60 Fe) MG/5ML syrup Place 325 mg into feeding tube daily.     hydrochlorothiazide (HYDRODIURIL) 12.5 MG tablet Place 1 tablet (12.5 mg total) into feeding tube daily. 30 tablet 0   HYDROcodone-acetaminophen (NORCO/VICODIN) 5-325 MG tablet Take 1 tablet by mouth every 6 (six) hours as needed for moderate pain (pain score 4-6). 30 tablet 0   ipratropium-albuterol (DUONEB) 0.5-2.5 (3) MG/3ML SOLN Take 3 mLs by nebulization every 6 (six) hours as needed. 360 mL 0   irbesartan (AVAPRO) 150 MG tablet Place 1 tablet (150 mg total) into feeding tube daily. 30 tablet 0   lidocaine (LIDODERM) 5 % Place 2 patches onto the skin daily. Remove & Discard patch within 12 hours or as directed by MD 30 patch 0   Nutritional Supplements (FEEDING SUPPLEMENT, OSMOLITE 1.2 CAL,) LIQD Place 1,000 mLs into feeding tube continuous. 1000 mL 0   nystatin (MYCOSTATIN) 100000 UNIT/ML suspension Take 5 mLs (500,000 Units total) by mouth 4 (four) times daily. 60 mL 0   spironolactone (ALDACTONE) 25 MG tablet Place 0.5 tablets (12.5 mg total) into feeding tube daily. 30 tablet 0   white petrolatum (VASELINE) OINT Apply 1 Application topically as needed for lip care. 1 packet 0   alendronate (FOSAMAX) 70 MG tablet Take 70 mg by mouth once a week. (Patient not taking: Reported on 01/14/2023)     Water For Irrigation, Sterile (FREE WATER) SOLN Place 100 mLs into feeding tube every 4 (four) hours. 100 mL 0     Physical Exam: Blood pressure (!) 138/47, pulse 86, temperature (!) 100.5 F (38.1 C), temperature source Axillary, resp. rate 18, weight 66.2 kg, SpO2 97%. General: chronically ill  appearing elderly female in NAD CV- RRR Pulm- breathing is non-labored Abd- soft, PEG tube in right upper abdomen with some mild induration, no erythema, mildly tender to palpation but patient also states yes when asked if having pain when palpating throughout abdomen. No noted purulence at this time. GU- deferred  MSK- UE/LE symmetrical, no cyanosis, clubbing, or edema. Neuro- weak lower extremities, left upper extremity not moving, moves right upper extremity. Tracks. Will nod and say yes when asked questions Psych- Alert  Skin: warm and dry, no rashes or lesions  Results for orders placed or performed during the hospital encounter of 01/14/23 (from the past 48 hours)  MRSA Next Gen by PCR, Nasal     Status: None   Collection Time: 01/14/23  1:27 AM   Specimen: Nasal Mucosa; Nasal  Swab  Result Value Ref Range   MRSA by PCR Next Gen NOT DETECTED NOT DETECTED    Comment: (NOTE) The GeneXpert MRSA Assay (FDA approved for NASAL specimens only), is one component of a comprehensive MRSA colonization surveillance program. It is not intended to diagnose MRSA infection nor to guide or monitor treatment for MRSA infections. Test performance is not FDA approved in patients less than 41 years old. Performed at [facility], [address] [place], [name] [address]   Glucose, capillary     Status: Abnormal   Collection Time: 01/14/23  3:28 AM  Result Value Ref Range   Glucose-Capillary 138 (H) 70 - 99 mg/dL    Comment: Glucose reference range applies only to samples taken after fasting for at least 8 hours.  Glucose, capillary     Status: Abnormal   Collection Time: 01/14/23  7:50 AM  Result Value Ref Range   Glucose-Capillary 129 (H) 70 - 99 mg/dL    Comment: Glucose reference range applies only to samples taken after fasting for at least 8 hours.  Glucose, capillary     Status: Abnormal   Collection Time: 01/14/23 11:29 AM  Result Value Ref Range   Glucose-Capillary 135 (H) 70 -  99 mg/dL    Comment: Glucose reference range applies only to samples taken after fasting for at least 8 hours.  Urinalysis, Routine w reflex microscopic -Urine, Clean Catch     Status: Abnormal   Collection Time: 01/14/23 12:13 PM  Result Value Ref Range   Color, Urine YELLOW YELLOW   APPearance CLEAR CLEAR   Specific Gravity, Urine 1.044 (H) 1.005 - 1.030   pH 5.0 5.0 - 8.0   Glucose, UA NEGATIVE NEGATIVE mg/dL   Hgb urine dipstick NEGATIVE NEGATIVE   Bilirubin Urine NEGATIVE NEGATIVE   Ketones, ur NEGATIVE NEGATIVE mg/dL   Protein, ur NEGATIVE NEGATIVE mg/dL   Nitrite NEGATIVE NEGATIVE   Leukocytes,Ua NEGATIVE NEGATIVE    Comment: Performed at [facility], [address] [place], [name] [address]  Magnesium     Status: None   Collection Time: 01/14/23  1:30 PM  Result Value Ref Range   Magnesium 2.4 1.7 - 2.4 mg/dL    Comment: Performed at [facility], [address] [place], [name] [address]  Phosphorus     Status: None   Collection Time: 01/14/23  1:30 PM  Result Value Ref Range   Phosphorus 4.0 2.5 - 4.6 mg/dL    Comment: Performed at [facility], [address], [place], [name] [address]  Glucose, capillary     Status: Abnormal   Collection Time: 01/14/23  3:33 PM  Result Value Ref Range   Glucose-Capillary 132 (H) 70 - 99 mg/dL    Comment: Glucose reference range applies only to samples taken after fasting for at least 8 hours.  Magnesium     Status: Abnormal   Collection Time: 01/14/23  6:12 PM  Result Value Ref Range   Magnesium 2.5 (H) 1.7 - 2.4 mg/dL    Comment: Performed at [facility], [address] [place], [name] [address]  Phosphorus     Status: None   Collection Time: 01/14/23  6:12 PM  Result Value Ref Range   Phosphorus 4.6 2.5 - 4.6 mg/dL    Comment: Performed at [facility], [address], [place], [name] [address]  Glucose, capillary     Status: Abnormal   Collection Time:  01/14/23  7:20 PM  Result Value  Ref Range   Glucose-Capillary 121 (H) 70 - 99 mg/dL    Comment: Glucose reference range applies only to samples taken after fasting for at least 8 hours.  Glucose, capillary     Status: Abnormal   Collection Time: 01/14/23 11:12 PM  Result Value Ref Range   Glucose-Capillary 166 (H) 70 - 99 mg/dL    Comment: Glucose reference range applies only to samples taken after fasting for at least 8 hours.  Glucose, capillary     Status: Abnormal   Collection Time: 01/15/23  3:24 AM  Result Value Ref Range   Glucose-Capillary 129 (H) 70 - 99 mg/dL    Comment: Glucose reference range applies only to samples taken after fasting for at least 8 hours.  Basic metabolic panel     Status: Abnormal   Collection Time: 01/15/23  6:23 AM  Result Value Ref Range   Sodium 137 135 - 145 mmol/L    Comment: DELTA CHECK NOTED   Potassium 5.2 (H) 3.5 - 5.1 mmol/L   Chloride 108 98 - 111 mmol/L   CO2 20 (L) 22 - 32 mmol/L   Glucose, Bld 156 (H) 70 - 99 mg/dL    Comment: Glucose reference range applies only to samples taken after fasting for at least 8 hours.   BUN 47 (H) 8 - 23 mg/dL   Creatinine, Ser 7.42 (H) 0.44 - 1.00 mg/dL   Calcium 8.8 (L) 8.9 - 10.3 mg/dL   GFR, Estimated 42 (L) >60 mL/min    Comment: (NOTE) Calculated using the CKD-EPI Creatinine Equation (2021)    Anion gap 9 5 - 15    Comment: Performed at [facility], [address], [place], [name] [address]  CBC     Status: Abnormal   Collection Time: 01/15/23  6:23 AM  Result Value Ref Range   WBC 19.2 (H) 4.0 - 10.5 K/uL   RBC 3.55 (L) 3.87 - 5.11 MIL/uL   Hemoglobin 9.8 (L) 12.0 - 15.0 g/dL   HCT 87.5 (L) 64.3 - 32.9 %   MCV 84.2 80.0 - 100.0 fL   MCH 27.6 26.0 - 34.0 pg   MCHC 32.8 30.0 - 36.0 g/dL   RDW 51.8 84.1 - 66.0 %   Platelets 263 150 - 400 K/uL   nRBC 0.0 0.0 - 0.2 %    Comment: Performed at [facility], [address], [place], [name] [address]  Magnesium     Status: Abnormal   Collection Time: 01/15/23   6:23 AM  Result Value Ref Range   Magnesium 2.5 (H) 1.7 - 2.4 mg/dL    Comment: Performed at [facility], [address] [place], [name] [address]  Phosphorus     Status: None   Collection Time: 01/15/23  6:23 AM  Result Value Ref Range   Phosphorus 4.2 2.5 - 4.6 mg/dL    Comment: Performed at [facility], [address], [place], [name] [address]  Glucose, capillary     Status: Abnormal   Collection Time: 01/15/23  7:27 AM  Result Value Ref Range   Glucose-Capillary 157 (H) 70 - 99 mg/dL    Comment: Glucose reference range applies only to samples taken after fasting for at least 8 hours.  Comprehensive metabolic panel     Status: Abnormal   Collection Time: 01/15/23  9:01 AM  Result Value Ref Range   Sodium 135 135 - 145  mmol/L   Potassium 4.6 3.5 - 5.1 mmol/L   Chloride 107 98 - 111 mmol/L   CO2 18 (L) 22 - 32 mmol/L   Glucose, Bld 205 (H) 70 - 99 mg/dL    Comment: Glucose reference range applies only to samples taken after fasting for at least 8 hours.   BUN 47 (H) 8 - 23 mg/dL   Creatinine, Ser 1.61 0.44 - 1.00 mg/dL   Calcium 8.5 (L) 8.9 - 10.3 mg/dL   Total Protein 6.6 6.5 - 8.1 g/dL   Albumin 1.9 (L) 3.5 - 5.0 g/dL   AST 096 (H) 15 - 41 U/L   ALT 208 (H) 0 - 44 U/L   Alkaline Phosphatase 84 38 - 126 U/L   Total Bilirubin 0.9 <1.2 mg/dL   GFR, Estimated >04 >54 mL/min    Comment: (NOTE) Calculated using the CKD-EPI Creatinine Equation (2021)    Anion gap 10 5 - 15    Comment: Performed at [facility], [address], [place], [name] [address]   Overnight EEG with video Result Date: 01/15/2023 Jocelyn Quest, MD     01/15/2023 10:06 AM Patient Name: Jocelyn [name] MRN: [id number] Epilepsy Attending: Charlsie [name] Referring Physician/Provider: Harolyn Rutherford, MD Duration: 01/14/2023 1104 to 01/15/2023 0945  Patient history:  89 y.o. female who re-presents with right parietotemporal predominantly subarachnoid hemorrhage with a small  parenchymal component at the location of one of her recent strokes, while on Eliquis. Discharged on 12/11 after admission for right hemisphere strokes. EEG to evaluate for seizure  Level of alertness: Awake, asleep  AEDs during EEG study: None  Technical aspects: This EEG study was done with scalp electrodes positioned according to the 10-20 International system of electrode placement. Electrical activity was reviewed with band pass filter of 1-70Hz , sensitivity of 7 uV/mm, display speed of 51mm/sec with a 60Hz  notched filter applied as appropriate. EEG data were recorded continuously and digitally stored.  Video monitoring was available and reviewed as appropriate.  Description: The posterior dominant rhythm consists of 7.5 Hz activity of moderate voltage (25-35 uV) seen predominantly in posterior head regions, symmetric and reactive to eye opening and eye closing. Sleep was characterized by vertex waves, sleep spindles (12 to 14 Hz), maximal frontocentral region. EEG showed continuous generalized and lateralized right hemisphere 3 to 6 Hz theta-delta slowing. Generalized and maximal posterior quadrant sharp transients with triphasic morphology were noted intermittently. Hyperventilation and photic stimulation were not performed.    ABNORMALITY - Continuous slow, generalized and lateralized right hemisphere  IMPRESSION: This study is suggestive of cortical dysfunction arising from right hemisphere likely secondary to underlying structural abnormality. Additionally there is moderate diffuse encephalopathy. No seizures were seen throughout the recording.  Generalized sharp transients noted during routine eeg do not appear to be epileptic in nature. Jocelyn [name]  DG CHEST PORT 1 VIEW Result Date: 01/14/2023 CLINICAL DATA:  [id number] Respiratory abnormalities [id number] EXAM: PORTABLE CHEST 1 VIEW COMPARISON:  01/09/2023 chest radiograph. FINDINGS: The right rotated chest radiograph. Stable cardiomediastinal  silhouette with mild cardiomegaly. No pneumothorax. No pleural effusion. No pulmonary edema. No consolidative airspace disease. Previously described faint upper right lung opacities are not discretely visualized on today's radiograph. IMPRESSION: 1. Mild cardiomegaly. No pulmonary edema. 2. Previously described faint upper right lung opacities are not discretely visualized on today's radiograph. Electronically Signed   By: Delbert Phenix M.D.   On: 01/14/2023 16:36   EEG adult Result Date: 01/14/2023 Jocelyn Quest,  MD     01/14/2023 11:00 AM Patient Name: CRESENCIA AMSBERRY MRN: [id number] Epilepsy Attending: Charlsie [name] Referring Physician/Provider: Elmer Picker, NP Date: 01/14/2023 Duration: 23.35 mins Patient history:  89 y.o. female who re-presents with right parietotemporal predominantly subarachnoid hemorrhage with a small parenchymal component at the location of one of her recent strokes, while on Eliquis. Discharged on 12/11 after admission for right hemisphere strokes. EEG to evaluate for seizure Level of alertness: Awake, asleep AEDs during EEG study: None Technical aspects: This EEG study was done with scalp electrodes positioned according to the 10-20 International system of electrode placement. Electrical activity was reviewed with band pass filter of 1-70Hz , sensitivity of 7 uV/mm, display speed of 38mm/sec with a 60Hz  notched filter applied as appropriate. EEG data were recorded continuously and digitally stored.  Video monitoring was available and reviewed as appropriate. Description: The posterior dominant rhythm consists of 7.5 Hz activity of moderate voltage (25-35 uV) seen predominantly in posterior head regions, symmetric and reactive to eye opening and eye closing. Sleep was characterized by vertex waves, sleep spindles (12 to 14 Hz), maximal frontocentral region. EEG showed continuous generalized and lateralized right hemisphere 3 to 6 Hz theta-delta slowing. Generalized sharp  transients were noted. Hyperventilation and photic stimulation were not performed.   ABNORMALITY - Continuous slow, generalized and lateralized right hemisphere IMPRESSION: This study is suggestive of cortical dysfunction arising from right hemisphere likely secondary to underlying structural abnormality. Additionally there is moderate diffuse encephalopathy. No seizures were seen throughout the recording. Generalized sharp transients were noted. Recommend long term monitoring for further evaluation Priyanka Annabelle Harman   MR BRAIN WO CONTRAST Result Date: 01/13/2023 CLINICAL DATA:  Follow-up examination for recent stroke, now with evidence for hemorrhagic transformation. EXAM: MRI HEAD WITHOUT CONTRAST TECHNIQUE: Multiplanar, multiecho pulse sequences of the brain and surrounding structures were obtained without intravenous contrast. COMPARISON:  Prior CTs from earlier the same day as well as recent MRI from 12/30/2022. FINDINGS: Brain: Cerebral volume within normal limits for age. Patchy T2/FLAIR hyperintensity involving the supratentorial cerebral white matter, consistent with chronic small vessel ischemic disease, moderately advanced in nature. Small remote left cerebellar infarct. There has been normal expected interval evolution of previously identified right MCA distribution infarct. Overall, size and distribution of the infarct is relatively stable, although there has been some interval blooming of a few foci since previous. No new areas of infarction. Changes of wallerian degeneration noted extending along the descending right white matter tract. Associated hemorrhagic transformation with small volume subarachnoid hemorrhage within the right sylvian fissure, stable from prior CT. No other acute or subacute ischemic changes elsewhere within the brain. No other acute or chronic intracranial blood products. No mass lesion or midline shift. No hydrocephalus or extra-axial fluid collection. Pituitary gland  suprasellar region within normal limits. Vascular: Major intracranial vascular flow voids are maintained. Skull and upper cervical spine: Craniocervical junction within normal limits. Bone marrow signal intensity normal. No scalp soft tissue abnormality. Sinuses/Orbits: Prior bilateral ocular lens replacement. Paranasal sinuses are largely clear. Trace left mastoid effusion, of doubtful significance. Other: None. IMPRESSION: 1. Normal expected interval evolution of previously identified right MCA distribution infarct. Associated hemorrhagic transformation with small volume subarachnoid hemorrhage within the right Sylvian fissure, stable from prior CT. 2. No other new acute intracranial abnormality. 3. Underlying moderately advanced chronic microvascular ischemic disease. Electronically Signed   By: Rise Mu M.D.   On: 01/13/2023 23:35   CT ANGIO HEAD NECK W WO CM (CODE STROKE)  Result Date: 01/13/2023 CLINICAL DATA:  Initial evaluation for acute neuro deficit, stroke, subarachnoid hemorrhage. EXAM: CT ANGIOGRAPHY HEAD AND NECK WITH AND WITHOUT CONTRAST TECHNIQUE: Multidetector CT imaging of the head and neck was performed using the standard protocol during bolus administration of intravenous contrast. Multiplanar CT image reconstructions and MIPs were obtained to evaluate the vascular anatomy. Carotid stenosis measurements (when applicable) are obtained utilizing NASCET criteria, using the distal internal carotid diameter as the denominator. RADIATION DOSE REDUCTION: This exam was performed according to the departmental dose-optimization program which includes automated exposure control, adjustment of the mA and/or kV according to patient size and/or use of iterative reconstruction technique. CONTRAST:  75mL OMNIPAQUE IOHEXOL 350 MG/ML SOLN COMPARISON:  Prior CT from earlier the same day as well as MRA from 12/30/2022 FINDINGS: CTA NECK FINDINGS Aortic arch: Aortic arch within normal limits for  caliber with standard branch pattern. Aortic atherosclerosis. No significant stenosis about the origin the great vessels. Right carotid system: Right common and internal carotid arteries are patent without dissection. Moderate calcified plaque about the right carotid bulb without hemodynamically significant greater than 50% stenosis. Left carotid system: Left common and internal carotid arteries are patent without dissection. Mild-to-moderate atheromatous plaque about the left carotid bulb without hemodynamically significant greater than 50% stenosis. Vertebral arteries: Right vertebral artery dominant. Atheromatous plaque at the origin of the right vertebral artery with moderate to severe stenosis. Vertebral arteries are otherwise patent without stenosis or dissection. Skeleton: No worrisome osseous lesions. Advanced spondylosis at C4-5 through C6-7. patient is edentulous. Osteoarthritic changes noted about the TMJs. Other neck: No other acute finding. Upper chest: No other acute finding. Review of the MIP images confirms the above findings CTA HEAD FINDINGS Anterior circulation: Moderate atheromatous change about the carotid siphons without hemodynamically significant stenosis. Note again made of a 3-4 mm outpouching extending posteriorly and inferiorly from the supraclinoid left ICA, which could reflect a small aneurysm versus vascular infundibulum (series 6, image 102). A similar 3-4 mm outpouching at the contralateral supraclinoid right ICA (series 6, image 103), which could also reflect a small infundibulum versus aneurysm. A1 segments patent bilaterally. Normal anterior communicating artery complex. Anterior cerebral arteries patent without significant stenosis. No M1 stenosis or occlusion. Normal MCA bifurcations. Distal MCA branches remain patent and perfused. Posterior circulation: Both V4 segments patent without stenosis. Neither PICA origin well visualized. Basilar patent without stenosis. Superior  cerebellar and posterior cerebral arteries patent bilaterally. Venous sinuses: Not well assessed due to timing of the contrast bolus. Anatomic variants: As above. Review of the MIP images confirms the above findings IMPRESSION: 1. Negative CTA for large vessel occlusion or other emergent finding. 2. 3-4 mm outpouchings extending posteriorly and inferiorly from both supraclinoid ICAs, which could reflect small aneurysms versus vascular infundibula. Appearance is similar as compared to recent MRA from 12/30/2022. 3. Moderate atheromatous plaque about the carotid bifurcations and carotid siphons without hemodynamically significant stenosis. 4. Atheromatous plaque at the origin of the right vertebral artery with moderate to severe stenosis. 5.  Aortic Atherosclerosis (ICD10-I70.0). Electronically Signed   By: Rise Mu M.D.   On: 01/13/2023 21:06   CT HEAD CODE STROKE WO CONTRAST Result Date: 01/13/2023 CLINICAL DATA:  Code stroke. Initial evaluation for neuro deficit, facial droop. EXAM: CT HEAD WITHOUT CONTRAST TECHNIQUE: Contiguous axial images were obtained from the base of the skull through the vertex without intravenous contrast. RADIATION DOSE REDUCTION: This exam was performed according to the departmental dose-optimization program which includes automated exposure control, adjustment  of the mA and/or kV according to patient size and/or use of iterative reconstruction technique. COMPARISON:  Prior MRI from 12/30/2022 FINDINGS: Brain: Abnormal hypodensity related to recently identified right MCA distribution infarcts again seen, similar to prior. There has been interval development of scattered small volume subarachnoid hemorrhage within the right sylvian fissure. No significant mass effect. No other acute intracranial hemorrhage. No other acute large vessel territory infarct. No mass lesion or midline shift. No hydrocephalus or extra-axial fluid collection. Vascular: No abnormal hyperdense vessel.  Skull: Scalp soft tissues within normal limits.  Calvarium intact. Sinuses/Orbits: Globes and orbital soft tissues within normal limits. Small volume pneumatized secretions noted within the left sphenoid sinus. Paranasal sinuses are otherwise clear. No mastoid effusion. Other: None. ASPECTS [facility] Stroke Program Early CT Score) - Ganglionic level infarction (caudate, lentiform nuclei, internal capsule, insula, M1-M3 cortex): 4 - Supraganglionic infarction (M4-M6 cortex): 3 Total score (0-10 with 10 being normal): 7 IMPRESSION: 1. Interval development of scattered small volume subarachnoid hemorrhage within the right Sylvian fissure. No significant mass effect. Upon review of recent MRA there is question of a 3-4 mm outpouching at the takeoff of the right PCOM, which could potentially reflect a small aneurysm versus infundibulum, not appreciated on initial interpretation. This finding is not certain, as no aneurysm is described at this location on recent arteriogram. 2. Evolving right MCA distribution infarcts, similar to prior. 3. Aspects is 7. Critical Value/emergent results were called by telephone at the time of interpretation on 01/13/2023 at 7:59 pm to provider DAVID WELLS , who verbally acknowledged these results. Electronically Signed   By: Rise Mu M.D.   On: 01/13/2023 20:11      Assessment/Plan Possible abdominal wall abscess S/p PEG placement 12/9 Dr. Bedelia Person - TMAX 101.4 in the last 24h, WBC 19 - Since workup for other sources of infection have been negative, reasonable to workup possible abscess. No clear abscess or purulence appreciated on exam and no erythema but some induration surrounding GTube. CT AP with IV and PO contrast through G tube ordered given induration in conjunction with diffuse mild abdominal pain and concern for infection.  FEN - NPO, hold tube feeds VTE - SCD's, DOAC held in the setting of ICH ID - UA 12/14 negative, CXR 12/14 negative  Admit - stroke  service   Transaminitis - AST 123, ALT 208, alk phos and bilirubin are WNL; gallbladder is surgically absent  R SAH  Cerebral aneurysm  HTN emergency Hx ischemic R MCA stroke  A.fib  HLD ODA Osteoarthritis    I reviewed last 24 h vitals and pain scores, last 48 h intake and output, last 24 h labs and trends, and last 24 h imaging results.  I discussed plan with his primary service, PA Pamalee Leyden  I spent a total of 36 minutes in both face-to-face and non-face-to-face activities, excluding procedures performed, for this visit on the date of this encounter.   Lysle Rubens, MD [facility] Surgery 01/15/2023, 10:08 AM Please see Amion for pager number during day hours 7:00am-4:30pm or 7:00am -11:30am on weekends

## 2023-01-15 NOTE — Procedures (Addendum)
Patient Name: Jocelyn [name]  MRN: [id number]  Epilepsy Attending: Charlsie Quest  Referring Physician/Provider: Harolyn Rutherford, MD  Duration: 01/14/2023 1104 to 01/15/2023 1104   Patient history:  89 y.o. female who re-presents with right parietotemporal predominantly subarachnoid hemorrhage with a small parenchymal component at the location of one of her recent strokes, while on Eliquis. Discharged on 12/11 after admission for right hemisphere strokes. EEG to evaluate for seizure   Level of alertness: Awake, asleep   AEDs during EEG study: None   Technical aspects: This EEG study was done with scalp electrodes positioned according to the 10-20 International system of electrode placement. Electrical activity was reviewed with band pass filter of 1-70Hz , sensitivity of 7 uV/mm, display speed of 60mm/sec with a 60Hz  notched filter applied as appropriate. EEG data were recorded continuously and digitally stored.  Video monitoring was available and reviewed as appropriate.   Description: The posterior dominant rhythm consists of 7.5 Hz activity of moderate voltage (25-35 uV) seen predominantly in posterior head regions, symmetric and reactive to eye opening and eye closing. Sleep was characterized by vertex waves, sleep spindles (12 to 14 Hz), maximal frontocentral region. EEG showed continuous generalized and lateralized right hemisphere 3 to 6 Hz theta-delta slowing. Generalized and maximal posterior quadrant sharp transients with triphasic morphology were noted intermittently. Hyperventilation and photic stimulation were not performed.      ABNORMALITY - Continuous slow, generalized and lateralized right hemisphere   IMPRESSION: This study is suggestive of cortical dysfunction arising from right hemisphere likely secondary to underlying structural abnormality. Additionally there is moderate diffuse encephalopathy. No seizures were seen throughout the recording.   Generalized sharp  transients noted during routine eeg do not appear to be epileptic in nature.  Tawney Vanorman Annabelle Harman

## 2023-01-15 NOTE — Evaluation (Signed)
Speech Language Pathology Evaluation Patient Details Name: Jocelyn [name] MRN: [id number] DOB: 1931/07/18 Today's Date: 01/15/2023 Time: 1324-4010 SLP Time Calculation (min) (ACUTE ONLY): 10 min  Problem List:  Patient Active Problem List   Diagnosis Date Noted   ICH (intracerebral hemorrhage) (HCC) 01/14/2023   Acute ischemic stroke (HCC) 12/29/2022   Coronary artery disease of native artery of native heart with stable angina pectoris (HCC) 04/21/2022   Coronary artery calcification 10/27/2021   Aortic atherosclerosis (HCC) 10/27/2021   Lumbar facet arthropathy 03/22/2018   SI joint arthritis (HCC) 03/22/2018   Chronic pain of both knees 03/22/2018   Chronic pain syndrome 03/22/2018   Right sided colitis 12/30/2017   Impaired fasting glucose 02/06/2017   Carpal tunnel syndrome on right 09/05/2015   Gastroesophageal reflux disease without esophagitis 09/05/2015   Hx of iron deficiency anemia 09/02/2015   OSA on CPAP 08/01/2015   Bilateral primary osteoarthritis of knee 08/01/2015   Controlled substance agreement signed 07/08/2015   Essential hypertension 07/28/2014   Lumbar degenerative disc disease 07/28/2014   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   Stable angina (HCC) 07/10/2013   Hyperlipidemia LDL goal <70 07/10/2013   Cerebrovascular accident (CVA) (HCC) 07/10/2013   Dyspnea on exertion 07/10/2013   Hearing loss 01/29/2009   Arthritis sicca 05/23/2006   Past Medical History:  Past Medical History:  Diagnosis Date   Arthritis of both knees 08/01/2015   Colitis    Essential hypertension, benign 07/28/2014   GERD (gastroesophageal reflux disease)    Hx of iron deficiency anemia 09/02/2015   Hyperlipidemia    Hypertension    Lumbar pain    Medicare annual wellness visit, subsequent 04/07/2017   OSA on CPAP 08/01/2015   Overweight (BMI 25.0-29.9) 07/10/2013   Prediabetes 09/02/2015   SOB (shortness of breath) 07/10/2013   Past Surgical History:  Past Surgical History:  Procedure  Laterality Date   ABDOMINAL HYSTERECTOMY     CATARACT EXTRACTION     CHOLECYSTECTOMY     COLONOSCOPY WITH PROPOFOL N/A 04/09/2018   Procedure: COLONOSCOPY WITH PROPOFOL;  Surgeon: Toney Reil, MD;  Location: [facility] ENDOSCOPY;  Service: Gastroenterology;  Laterality: N/A;   ESOPHAGOGASTRODUODENOSCOPY N/A 01/09/2023   Procedure: ESOPHAGOGASTRODUODENOSCOPY (EGD);  Surgeon: Diamantina Monks, MD;  Location: [facility] ENDOSCOPY;  Service: General;  Laterality: N/A;   IR CT HEAD LTD  12/29/2022   IR PERCUTANEOUS ART THROMBECTOMY/INFUSION INTRACRANIAL INC DIAG ANGIO  12/29/2022   IR US GUIDE VASC ACCESS RIGHT  12/29/2022   IR US GUIDE VASC ACCESS RIGHT  12/29/2022   LUMBAR DISC SURGERY     PEG PLACEMENT N/A 01/09/2023   Procedure: PERCUTANEOUS ENDOSCOPIC GASTROSTOMY (PEG) PLACEMENT;  Surgeon: Diamantina Monks, MD;  Location: [facility] ENDOSCOPY;  Service: General;  Laterality: N/A;   RADIOLOGY WITH ANESTHESIA N/A 12/29/2022   Procedure: IR WITH ANESTHESIA;  Surgeon: Radiologist, Medication, MD;  Location: [facility] OR;  Service: Radiology;  Laterality: N/A;   HPI:  89 yo female presenting back to the hospital (transfer from ED at [facility]) from SNF on 12/14 with acute stroke symptoms and per EMS she had a new onset of left sided facial droop, numbness to left arm and significantly increased weakness of left arm. CTH showed showed interval development of scattered subarachnoid hemorrhage in the right sylvian fissure. Neurosurgery consulted and recommended Keppra 500mg . WBC elevated and trauma was consulted to evaluate PEG tube with CT abdomen pending. She was recently admitted 11/27-12/11 with L sided weakness and R MCA occlusion s/p IR revascularization with  complicated arterial cannulation requiring multiple sticks. Intubated 11/28-11/29. PMH includes: GERD, HTN, CHF, AFIB RVR on eliquis, HLD, OSA. MBSS 01/02/23 was limited due to oral holding, repeat MBS on 12/11 showed moderate oral dysphagia and suspected pharyngoesophageal  dysphagia with prominent cricopharyngeaus and recommened PO diet of Dys 1 (puree), thin liquids.   Assessment / Plan / Recommendation Clinical Impression  Patient presents with impairments in areas of cognition, language and speech as per this assessment. Of note, patient continues with some decreased alertness which resulted in only basic level assessment of all areas. She was oriented to self and gave her correct age, but disoriented to time and situation. (giving month as June) She followed basic commands during oral care. She was able to identify family members in room. She exhibited delays in all responses and did not demonstrate (question hearing impairment) comprehension of basic level questions SLP asked unless she was complete focused and looking directly at SLP when question was asked. During swallow assessment, patient was noted to be impulsive with self-feeding. She did comment at phrase level and make some requests at short phrase level with language content adequate. Speech intelligiblity was impacted partly by patient's low vocal intensity, with SLP understanding approximately 50-60% of her speech at phrase level when context was not known. SLP will continue to follow acutely and recommending continued services at next venue of care as well.    SLP Assessment  SLP Recommendation/Assessment: Patient needs continued Speech Lanaguage Pathology Services SLP Visit Diagnosis: Dysarthria and anarthria (R47.1);Cognitive communication deficit (R41.841)    Recommendations for follow up therapy are one component of a multi-disciplinary discharge planning process, led by the attending physician.  Recommendations may be updated based on patient status, additional functional criteria and insurance authorization.    Follow Up Recommendations  Skilled nursing-short term rehab (<3 hours/day)    Assistance Recommended at Discharge  Frequent or constant Supervision/Assistance  Functional Status  Assessment Patient has had a recent decline in their functional status and demonstrates the ability to make significant improvements in function in a reasonable and predictable amount of time.  Frequency and Duration min 2x/week  2 weeks      SLP Evaluation Cognition  Overall Cognitive Status: Difficult to assess Arousal/Alertness: Lethargic Orientation Level: Oriented to person;Oriented to place;Disoriented to time;Disoriented to situation Year: Other (Comment) ("I dont know") Month: June Attention: Sustained Sustained Attention: Impaired Sustained Attention Impairment: Verbal basic;Functional basic Awareness: Impaired Awareness Impairment: Intellectual impairment Problem Solving: Impaired Problem Solving Impairment: Functional basic Executive Function: Self Monitoring Self Monitoring: Impaired Self Monitoring Impairment: Functional basic Behaviors: Impulsive Safety/Judgment: Impaired       Comprehension  Auditory Comprehension Overall Auditory Comprehension: Impaired Yes/No Questions: Impaired Basic Biographical Questions: 26-50% accurate Commands: Impaired One Step Basic Commands: 50-74% accurate Conversation: Simple Interfering Components: Processing speed;Attention EffectiveTechniques: Extra processing time;Repetition;Increased volume Visual Recognition/Discrimination Discrimination: Not tested Reading Comprehension Reading Status: Not tested    Expression Expression Primary Mode of Expression: Verbal Verbal Expression Overall Verbal Expression: Other (comment) (limited assessment) Initiation: Impaired Automatic Speech: Name;Social Response Level of Generative/Spontaneous Verbalization: Phrase Pragmatics: Impairment Impairments: Eye contact Interfering Components: Attention;Premorbid deficit Effective Techniques: Open ended questions Non-Verbal Means of Communication: Not applicable   Oral / Motor  Oral Motor/Sensory Function Overall Oral Motor/Sensory  Function: Mild impairment Facial ROM: Reduced left Facial Symmetry: Abnormal symmetry left Facial Strength: Reduced left Lingual ROM: Within Functional Limits Lingual Symmetry: Within Functional Limits Lingual Strength: Within Functional Limits Velum: Within Functional Limits Mandible: Within Functional Limits Motor Speech  Overall Motor Speech: Impaired Respiration: Impaired Level of Impairment: Phrase Phonation: Low vocal intensity Resonance: Within functional limits Articulation: Impaired Level of Impairment: Phrase Intelligibility: Intelligibility reduced Word: 75-100% accurate Phrase: 50-74% accurate Motor Speech Errors: Not applicable Effective Techniques: Increased vocal intensity            Angela Nevin, MA, CCC-SLP Speech Therapy

## 2023-01-15 NOTE — TOC CAGE-AID Note (Signed)
Transition of Care [facility]) - CAGE-AID Screening   Patient Details  Name: Jocelyn [name] MRN: [id number] Date of Birth: [birth year]  Transition of [facility]) CM/SW Contact:    Janora Norlander, RN Phone Number: [phone] 01/15/2023, 4:48 PM   Clinical Narrative: Pt is here after having a stroke.  Trauma/CCS is consulted due to the PEG. Pt has mild confusion therefore screening cannot be completely done but she does deny alcohol or drug use.    CAGE-AID Screening: Substance Abuse Screening unable to be completed due to: : Patient unable to participate

## 2023-01-15 NOTE — Progress Notes (Addendum)
STROKE TEAM PROGRESS NOTE   BRIEF HPI [name] is a 89 y.o. female with history of Arthritis of both knees, Colitis, Essential hypertension, GERD, iron deficiency anemia, Hyperlipidemia, Lumbar pain, OSA on CPAP (08/01/2015), Overweight, Prediabetes, and SOB., atrial fibrillation (on Eliquis) who was recently discharged from the Stroke service at [facility] on 12/11 after management of an acute right MCA territory infarct in the setting of atrial fibrillation. She had presented at that time with left-sided weakness and confusion. Imaging had revealed a right A1 and M2 occlusion and she underwent successful mechanical thrombectomy, with TICI 3 revascularization. which was successful. Her Eliquis was restarted on 12/10 for her chronic A-fib. She was initially unable to pass her swallow study, and PEG tube was inserted on 12/9 with tube feeding started, although patient did pass swallow study afterwards and was beginning to take foods by mouth at the time of her discharge. She did have some pain in her left knee, which was successfully treated with lidocaine patches. She was discharged to a SNF.   She re-presented to the ED at [facility] on Friday from [facility] rehab facility for acute stroke symptoms. LKN was 1830. [facility] had been at [facility] for LUE weakness. Per EMS she had new onset L side facial droop, numbness to L arm, and significantly increased weakness to L arm. CTH showed interval development of scattered subarachnoid hemorrhage in the right sylvian fissure. Teleneurology was consulted. NIHSS was 17. Teleneurology advised DOAC reversal with KCentra, BP management, EEG for observed facial twitching and admission as well as Neurosurgery consult. Neurosurgery recommended Keppra 500 mg. CTA showed no LVO; atheromatous plaque was again noted. 3-4 mm outpouchings extending posteriorly and inferiorly from both supraclinoid ICAs were noted, which Radiology felt could reflect small aneurysms versus vascular  infundibula; the outpouchings were similar in appearance compared to recent MRA from 12/30/2022.  NIH on Admission 17  SIGNIFICANT HOSPITAL EVENTS 12/11- Discharged to rehab 12/14- Admitted with SAH  INTERIM HISTORY/SUBJECTIVE  Foul smelling discharge from PEG tube.   Electrolytes improving, WBC elevated, trauma re engaged to eval PEG tube. CT abd pending More alert today, low grade fever, neuro stable.   OBJECTIVE  CBC    Component Value Date/Time   WBC 19.2 (H) 01/15/2023 0623   RBC 3.55 (L) 01/15/2023 0623   HGB 9.8 (L) 01/15/2023 0623   HGB 12.5 09/19/2022 0916   HCT 29.9 (L) 01/15/2023 0623   HCT 39.6 09/19/2022 0916   PLT 263 01/15/2023 0623   PLT 169 09/19/2022 0916   MCV 84.2 01/15/2023 0623   MCV 87 09/19/2022 0916   MCV 82 04/13/2011 1125   MCH 27.6 01/15/2023 0623   MCHC 32.8 01/15/2023 0623   RDW 14.6 01/15/2023 0623   RDW 12.9 09/19/2022 0916   RDW 14.4 04/13/2011 1125   LYMPHSABS 1.3 01/13/2023 2020   LYMPHSABS 3.7 (H) 07/08/2015 1136   MONOABS 0.7 01/13/2023 2020   EOSABS 0.1 01/13/2023 2020   EOSABS 0.2 07/08/2015 1136   BASOSABS 0.0 01/13/2023 2020   BASOSABS 0.1 07/08/2015 1136    BMET    Component Value Date/Time   NA 137 01/15/2023 0623   NA 143 12/07/2022 1535   NA 141 04/13/2011 1125   K 5.2 (H) 01/15/2023 0623   K 3.4 (L) 04/13/2011 1125   CL 108 01/15/2023 0623   CL 105 04/13/2011 1125   CO2 20 (L) 01/15/2023 0623   CO2 29 04/13/2011 1125   GLUCOSE 156 (H) 01/15/2023 1610  GLUCOSE 111 (H) 04/13/2011 1125   BUN 47 (H) 01/15/2023 0623   BUN 16 12/07/2022 1535   BUN 14 04/13/2011 1125   CREATININE 1.24 (H) 01/15/2023 0623   CREATININE 0.80 08/01/2018 1544   CALCIUM 8.8 (L) 01/15/2023 0623   CALCIUM 9.2 04/13/2011 1125   EGFR 74 12/07/2022 1535   GFRNONAA 42 (L) 01/15/2023 0623   GFRNONAA 67 08/01/2018 1544    IMAGING past 24 hours DG CHEST PORT 1 VIEW Result Date: 01/14/2023 CLINICAL DATA:  [id number] Respiratory  abnormalities [id number] EXAM: PORTABLE CHEST 1 VIEW COMPARISON:  01/09/2023 chest radiograph. FINDINGS: The right rotated chest radiograph. Stable cardiomediastinal silhouette with mild cardiomegaly. No pneumothorax. No pleural effusion. No pulmonary edema. No consolidative airspace disease. Previously described faint upper right lung opacities are not discretely visualized on today's radiograph. IMPRESSION: 1. Mild cardiomegaly. No pulmonary edema. 2. Previously described faint upper right lung opacities are not discretely visualized on today's radiograph. Electronically Signed   By: Delbert Phenix M.D.   On: 01/14/2023 16:36   EEG adult Result Date: 01/14/2023 Charlsie Quest, MD     01/14/2023 11:00 AM Patient Name: Jocelyn [name] MRN: [id number] Epilepsy Attending: Charlsie Quest Referring Physician/Provider: Elmer Picker, NP Date: 01/14/2023 Duration: 23.35 mins Patient history:  89 y.o. female who re-presents with right parietotemporal predominantly subarachnoid hemorrhage with a small parenchymal component at the location of one of her recent strokes, while on Eliquis. Discharged on 12/11 after admission for right hemisphere strokes. EEG to evaluate for seizure Level of alertness: Awake, asleep AEDs during EEG study: None Technical aspects: This EEG study was done with scalp electrodes positioned according to the 10-20 International system of electrode placement. Electrical activity was reviewed with band pass filter of 1-70Hz , sensitivity of 7 uV/mm, display speed of 34mm/sec with a 60Hz  notched filter applied as appropriate. EEG data were recorded continuously and digitally stored.  Video monitoring was available and reviewed as appropriate. Description: The posterior dominant rhythm consists of 7.5 Hz activity of moderate voltage (25-35 uV) seen predominantly in posterior head regions, symmetric and reactive to eye opening and eye closing. Sleep was characterized by vertex waves, sleep spindles (12 to  14 Hz), maximal frontocentral region. EEG showed continuous generalized and lateralized right hemisphere 3 to 6 Hz theta-delta slowing. Generalized sharp transients were noted. Hyperventilation and photic stimulation were not performed.   ABNORMALITY - Continuous slow, generalized and lateralized right hemisphere IMPRESSION: This study is suggestive of cortical dysfunction arising from right hemisphere likely secondary to underlying structural abnormality. Additionally there is moderate diffuse encephalopathy. No seizures were seen throughout the recording. Generalized sharp transients were noted. Recommend long term monitoring for further evaluation Priyanka O Yadav    Vitals:   01/15/23 0500 01/15/23 0600 01/15/23 0700 01/15/23 0800  BP: (!) 126/53 (!) 138/49 (!) 137/44 (!) 140/57  Pulse: 83 88 82 86  Resp: (!) 26 (!) 27 (!) 31 18  Temp:    (!) 100.5 F (38.1 C)  TempSrc:    Axillary  SpO2: 99% 92% 94% 97%  Weight: 66.2 kg        PHYSICAL EXAM General:  Alert, well-nourished, well-developed patient in no acute distress Psych:  Mood and affect appropriate for situation CV: Regular rate and rhythm on monitor Respiratory:  Regular, unlabored respirations on room air GI: Abdomen soft and nontender  Neurological Examination Mental Status: Awakens to voice. Able to give her name but unable to correctly answer the 2/5 orientation questions that she gave  a response to. Able to protrude tongue and weakly squeeze examiner's hand to command. Severe dysarthria. Only with one-word attempts to answer questions. Did not respond when asked how she is feeling. Unable to test naming due to her abulia.  Cranial Nerves: II:  PERRL Blinks to threat on some trials in temporal field of each eye. Tracks examiner's face with constant coaching.   III,IV, VI: No ptosis. Hypometric saccades as well as saccadic visual pursuits noted. Eyes are conjugate.  V: Reacts to touch bilaterally  VII: Smiles symmetrically to  command but there is a slight delay on the left.  VIII: Hearing intact to voice IX,X: Gag reflex deferred XI: Unable to assess due to poor comprehension of commands XII: Midline tongue extension after multiple requests to protrude Motor/Sensory: RUE: Weakly grips examiner's hand. Elevates forearm antigravity. Unable to elevate at shoulder antigravity LUE: No movement to command or noxious RLE weak withdrawal to noxious plantar stimulation and able to flex at knee and hip by about 30 degrees. No antigravity movement. LLE: withdraws about 2 inches to noxious plantar stimulation  Cerebellar: Unable to assess Gait: Unable to assess    ASSESSMENT/PLAN  Subarachnoid Hemorrhage :  scattered SAH in right sylvian fissure Etiology:  eliquis   Code Stroke CT head - Interval development of scattered small volume subarachnoid hemorrhage within the right Sylvian fissure. No significant mass effect. Upon review of recent MRA there is question of a 3-4 mm outpouching at the takeoff of the right PCOM, which could potentially reflect a small aneurysm versus infundibulum, not appreciated on initial interpretation. This finding is not certain, as no aneurysm is described at this location on recent arteriogram. Evolving right MCA distribution infarcts, similar to prior. Aspects is 7. CTA head & neck -  3-4 mm outpouchings extending posteriorly and inferiorly from both supraclinoid ICAs, which could reflect small aneurysms versus vascular infundibula. Appearance is similar as compared to recent MRA from 12/30/2022. Moderate atheromatous plaque about the carotid bifurcations and carotid siphons without hemodynamically significant stenosis. Atheromatous plaque at the origin of the right vertebral artery with moderate to severe stenosis. LDL 33 HgbA1c 6.4 EEG- This study is suggestive of cortical dysfunction arising from right hemisphere likely secondary to underlying structural abnormality. Additionally there is  moderate diffuse encephalopathy. No seizures were seen throughout the recording. Generalized sharp transients were noted. Recommend long term monitoring for further evaluation VTE prophylaxis - SCDs Eliquis (apixaban) daily prior to admission, now on No antithrombotic  Therapy recommendations:  Pending Disposition:  Neuro ICU  Hx of Stroke/TIA 11/28-12/11- Acute Ischemic Infarct:  right MCA territory infarct with R M2 and A1 occlusion s/p IR with TICI 3 revascularization, etiology: Cardioembolic in the setting of A-fib  Code Stroke  CT head  -hyperdense right MCA, cytotoxic edema in right stratum and insula.  ASPECTS 7   CTA head & neck acute clot at the right terminal ICA and acute obstructing right MCA M2 and ACA A1. CT perfusion 6/149 in the right MCA territory.  S/p IR via right ICA direct access with TICI3 MRI scattered acute right MCA distribution infarcts involving the right caudate and lentiform nuclei, right insula, and overlying right cerebral hemisphere, small volume petechial hemorrhage at the posterior right frontal region MRA  with interval revascularization of previously seen clot at the right ICA terminus and right M2 segment, Focal moderate distal right P2 stenosis. 2D Echo EF 60 to 65%.  LV severe concentric hypertrophy.  LA severely dilated LDL 33 HgbA1c 6.4 VTE  prophylaxis - Heparin IV Eliquis prior to admission, eliquis re-started 12/10 after PEG Therapy recommendations: SNF Deposition - SNF today   Abnormal EEG LTM connected Routine EEG 12/14 This study is suggestive of cortical dysfunction arising from right hemisphere likely secondary to underlying structural abnormality. Additionally there is moderate diffuse encephalopathy. No seizures were seen throughout the recording. Generalized sharp transients were noted. Recommend long term monitoring for further evaluation  Atrial fibrillation Home Meds: Eliquis Reversed with Kcentra at [facility] On tele Rate  controlled On hold due to [facility]   Cerebral aneurysm now with [facility] CTA- -4 mm outpouchings extending posteriorly and inferiorly from both supraclinoid ICAs, which could reflect small aneurysms versus vascular infundibula. Appearance is similar as compared to recent MRA from 12/30/2022. Moderate atheromatous plaque about the carotid bifurcations and carotid siphons without hemodynamically significant stenosis. Atheromatous plaque at the origin of the right vertebral artery with moderate to severe stenosis. Nimodipine 60mg  q4hr  Yearly MRA follow up as outpt   Hypertensive Emergency Home meds: Imdur 120 mg, olmesartan-amlodipine-HCTZ 40-10-12.5 mg, spironolactone 25 mg Amlodipine 5mg , nimodipine 60mg  q4hr BP goal: SBP 130-150 Long-term BP goal normotensive   Hyperlipidemia Home meds: Lipitor 40 mg, resumed in hospital LDL 33, goal < 70 Continue statin at discharge   Dysphagia Patient has post-stroke dysphagia, SLP consulted Continue NPO PEG Tube 12/9 Resume TF- Osmolite 1.2@55ml /hr- Initiate at 15ml/hr and increase by 51ml/hr q 8 hours until goal rate is each.  Free q6  Dietitian consulted Trace purulent drainage, tenderness around PEG- trauma re engaged CT Abd pending    Other Stroke Risk Factors  Obstructive sleep apnea,  on CPAP at home  Advanced age   Other acute issues Left knee pain - lidocaine patch - arthritis in bilateral knees- f/u outpatient   Other Active Problems Leukocytosis WBC 17.5 -> 15.0 -> 19.2 Tmax 100.48F Blood cultures pending CXR, UA ordered  Trend Completed 7 days of ceftriaxone during last hospitalization due to aspiration pna Transaminitis 176/286 -> 123/208 Trend  Hospital day # 1   Patient seen and examined by NP/APP with MD. MD to update note as needed.   Elmer Picker, DNP, FNP-BC [facility] Pager: [phone]  ATTENDING ATTESTATION:   89 y/o h/o afib recently admitted for stroke, placed on eliquis then readmitted  with small SAH. Initial concern for seizure, LTM neg. Exam improving-more alert today, following commands. Complains of pain in her abd. Cr increased, con't to monitor. Free water increased. WBC trending up but no fever. Trauma consulted for PEG tube mgt. Blood cx ordered today. CXR/UA neg. CT abd today along with CT head.   D/C LTM. H/o afib-Con't to hold [facility] in setting of SAH. Consider restarting tomorrow.   Dr. Viviann Spare evaluated pt independently, reviewed imaging, chart, labs. Discussed and formulated plan with the Resident/APP. Changes were made to the note where appropriate. Please see APP/resident note above for details.      This patient is critically ill due to [facility], I[facility] and at significant risk of neurological worsening, death form heart failure, respiratory failure, recurrent stroke, bleeding from [facility], seizure, sepsis. This patient's care requires constant monitoring of vital signs, hemodynamics, respiratory and cardiac monitoring, review of multiple databases, neurological assessment, discussion with family, other specialists and medical decision making of high complexity. I spent 35 minutes of neurocritical care time in the care of this patient.    Naziyah Tieszen,MD  To contact Stroke Continuity provider, please refer to [email or web address]. After hours, contact General Neurology

## 2023-01-15 NOTE — Evaluation (Signed)
Clinical/Bedside Swallow Evaluation Patient Details  Name: Jocelyn [name] MRN: [id number] Date of Birth: [birth year]  Today's Date: 01/15/2023 Time: SLP Start Time (ACUTE ONLY): 1150 SLP Stop Time (ACUTE ONLY): 1205 SLP Time Calculation (min) (ACUTE ONLY): 15 min  Past Medical History:  Past Medical History:  Diagnosis Date   Arthritis of both knees 08/01/2015   Colitis    Essential hypertension, benign 07/28/2014   GERD (gastroesophageal reflux disease)    Hx of iron deficiency anemia 09/02/2015   Hyperlipidemia    Hypertension    Lumbar pain    Medicare annual wellness visit, subsequent 04/07/2017   OSA on CPAP 08/01/2015   Overweight (BMI 25.0-29.9) 07/10/2013   Prediabetes 09/02/2015   SOB (shortness of breath) 07/10/2013   Past Surgical History:  Past Surgical History:  Procedure Laterality Date   ABDOMINAL HYSTERECTOMY     CATARACT EXTRACTION     CHOLECYSTECTOMY     COLONOSCOPY WITH PROPOFOL N/A 04/09/2018   Procedure: COLONOSCOPY WITH PROPOFOL;  Surgeon: Toney Reil, MD;  Location: [facility] ENDOSCOPY;  Service: Gastroenterology;  Laterality: N/A;   ESOPHAGOGASTRODUODENOSCOPY N/A 01/09/2023   Procedure: ESOPHAGOGASTRODUODENOSCOPY (EGD);  Surgeon: Diamantina Monks, MD;  Location: [facility] ENDOSCOPY;  Service: General;  Laterality: N/A;   IR CT HEAD LTD  12/29/2022   IR PERCUTANEOUS ART THROMBECTOMY/INFUSION INTRACRANIAL INC DIAG ANGIO  12/29/2022   IR US GUIDE VASC ACCESS RIGHT  12/29/2022   IR US GUIDE VASC ACCESS RIGHT  12/29/2022   LUMBAR DISC SURGERY     PEG PLACEMENT N/A 01/09/2023   Procedure: PERCUTANEOUS ENDOSCOPIC GASTROSTOMY (PEG) PLACEMENT;  Surgeon: Diamantina Monks, MD;  Location: [facility] ENDOSCOPY;  Service: General;  Laterality: N/A;   RADIOLOGY WITH ANESTHESIA N/A 12/29/2022   Procedure: IR WITH ANESTHESIA;  Surgeon: Radiologist, Medication, MD;  Location: [facility] OR;  Service: Radiology;  Laterality: N/A;   HPI:  Jocelyn [name] presenting back to the hospital (transfer from ED  at [facility]) from SNF on 12/14 with acute stroke symptoms and per EMS she had a new onset of left sided facial droop, numbness to left arm and significantly increased weakness of left arm. CTH showed showed interval development of scattered subarachnoid hemorrhage in the right sylvian fissure. Neurosurgery consulted and recommended Keppra 500mg . WBC elevated and trauma was consulted to evaluate PEG tube with CT abdomen pending. She was recently admitted 11/27-12/11 with L sided weakness and R MCA occlusion s/p IR revascularization with complicated arterial cannulation requiring multiple sticks. Intubated 11/28-11/29. PMH includes: GERD, HTN, CHF, AFIB RVR on eliquis, HLD, OSA. MBSS 01/02/23 was limited due to oral holding, repeat MBS on 12/11 showed moderate oral dysphagia and suspected pharyngoesophageal dysphagia with prominent cricopharyngeaus and recommened PO diet of Dys 1 (puree), thin liquids.    Assessment / Plan / Recommendation  Clinical Impression  Patient presents with clinical s/s of dysphagia as per this bedside swallow evaluation. Her daughter and granddaughter were both present in the room and they reported that one of the staff likely was feeding patient too fast at SNF, leading to suspected aspiration. Patient was somewhat drowsy when SLP arrived but she awakened fully to voice and when elevating HOB. After oral care, SLP assessed patient's swallowing via PO's of thin liquids (water) and puree solids (applesauce). She was able to feed self via cup and straw sips with water and spoon bites with puree when SLP holding applesauce cup. She was somewhat impulsive when eating applesauce but oral cavity cleared s/p swallows. With thin liquids, she  exhibited suspected swallow initiation delay and multiple swallows (no more than two) but no overt s/s aspiration and voice and vitals remained WFL. SLP recommending initaite PO diet of Dys 1 (puree) solids and thin liquids with full supervision. SLP will follow  for toleration and ability to advance. SLP Visit Diagnosis: Dysphagia, unspecified (R13.10)    Aspiration Risk  Mild aspiration risk    Diet Recommendation Thin liquid;Dysphagia 1 (Puree)    Liquid Administration via: Cup;Straw Medication Administration: Crushed with puree Compensations: Slow rate;Small sips/bites Postural Changes: Seated upright at 90 degrees    Other  Recommendations Oral Care Recommendations: Oral care BID;Staff/trained caregiver to provide oral care    Recommendations for follow up therapy are one component of a multi-disciplinary discharge planning process, led by the attending physician.  Recommendations may be updated based on patient status, additional functional criteria and insurance authorization.  Follow up Recommendations Skilled nursing-short term rehab (<3 hours/day)      Assistance Recommended at Discharge    Functional Status Assessment Patient has had a recent decline in their functional status and demonstrates the ability to make significant improvements in function in a reasonable and predictable amount of time.  Frequency and Duration min 2x/week  2 weeks       Prognosis Prognosis for improved oropharyngeal function: Good Barriers to Reach Goals: Cognitive deficits      Swallow Study   General Date of Onset: 01/15/23 HPI: Jocelyn [name] presenting back to the hospital (transfer from ED at [name]) from SNF on 12/14 with acute stroke symptoms and per EMS she had a new onset of left sided facial droop, numbness to left arm and significantly increased weakness of left arm. CTH showed showed interval development of scattered subarachnoid hemorrhage in the right sylvian fissure. Neurosurgery consulted and recommended Keppra 500mg . WBC elevated and trauma was consulted to evaluate PEG tube with CT abdomen pending. She was recently admitted 11/27-12/11 with L sided weakness and R MCA occlusion s/p IR revascularization with complicated arterial cannulation  requiring multiple sticks. Intubated 11/28-11/29. PMH includes: GERD, HTN, CHF, AFIB RVR on eliquis, HLD, OSA. MBSS 01/02/23 was limited due to oral holding, repeat MBS on 12/11 showed moderate oral dysphagia and suspected pharyngoesophageal dysphagia with prominent cricopharyngeaus and recommened PO diet of Dys 1 (puree), thin liquids. Type of Study: Bedside Swallow Evaluation Previous Swallow Assessment: 01/11/23 (during recent, previous admission) Diet Prior to this Study: G-tube Temperature Spikes Noted: No Respiratory Status: Nasal cannula History of Recent Intubation: No Behavior/Cognition: Alert;Cooperative;Pleasant mood;Requires cueing Oral Cavity Assessment: Within Functional Limits Oral Care Completed by SLP: Yes Oral Cavity - Dentition: Edentulous Vision: Functional for self-feeding Self-Feeding Abilities: Able to feed self;Needs set up Patient Positioning: Upright in bed Baseline Vocal Quality: Low vocal intensity Volitional Cough: Weak Volitional Swallow: Able to elicit    Oral/Motor/Sensory Function Overall Oral Motor/Sensory Function: Mild impairment Facial ROM: Reduced left Facial Symmetry: Abnormal symmetry left Facial Strength: Reduced left   Ice Chips     Thin Liquid Thin Liquid: Impaired Presentation: Cup;Straw Pharyngeal  Phase Impairments: Suspected delayed Swallow;Multiple swallows    Nectar Thick     Honey Thick     Puree Puree: Impaired Presentation: Spoon Pharyngeal Phase Impairments: Suspected delayed Swallow   Solid     Solid: Not tested     Angela Nevin, MA, CCC-SLP Speech Therapy

## 2023-01-16 ENCOUNTER — Inpatient Hospital Stay (HOSPITAL_COMMUNITY): Payer: 59

## 2023-01-16 DIAGNOSIS — I611 Nontraumatic intracerebral hemorrhage in hemisphere, cortical: Secondary | ICD-10-CM

## 2023-01-16 DIAGNOSIS — E440 Moderate protein-calorie malnutrition: Secondary | ICD-10-CM | POA: Insufficient documentation

## 2023-01-16 HISTORY — PX: IR REPLC GASTRO/COLONIC TUBE PERCUT W/FLUORO: IMG2333

## 2023-01-16 LAB — BASIC METABOLIC PANEL
Anion gap: 9 (ref 5–15)
BUN: 38 mg/dL — ABNORMAL HIGH (ref 8–23)
CO2: 19 mmol/L — ABNORMAL LOW (ref 22–32)
Calcium: 8.7 mg/dL — ABNORMAL LOW (ref 8.9–10.3)
Chloride: 110 mmol/L (ref 98–111)
Creatinine, Ser: 0.69 mg/dL (ref 0.44–1.00)
GFR, Estimated: >60 mL/min (ref >60)
Glucose, Bld: 113 mg/dL — ABNORMAL HIGH (ref 70–99)
Potassium: 4.6 mmol/L (ref 3.5–5.1)
Sodium: 138 mmol/L (ref 135–145)

## 2023-01-16 LAB — GLUCOSE, CAPILLARY
Glucose-Capillary: 119 mg/dL — ABNORMAL HIGH (ref 70–99)
Glucose-Capillary: 109 mg/dL — ABNORMAL HIGH (ref 70–99)
Glucose-Capillary: 112 mg/dL — ABNORMAL HIGH (ref 70–99)
Glucose-Capillary: 108 mg/dL — ABNORMAL HIGH (ref 70–99)
Glucose-Capillary: 114 mg/dL — ABNORMAL HIGH (ref 70–99)
Glucose-Capillary: 139 mg/dL — ABNORMAL HIGH (ref 70–99)

## 2023-01-16 LAB — MAGNESIUM: Magnesium: 2.6 mg/dL — ABNORMAL HIGH (ref 1.7–2.4)

## 2023-01-16 MED ORDER — IOHEXOL 300 MG/ML  SOLN
50.0000 mL | Freq: Once | INTRAMUSCULAR | Status: AC | PRN
  Administered 2023-01-16: 15 mL

## 2023-01-16 MED ORDER — LIDOCAINE VISCOUS HCL 2 % MT SOLN
OROMUCOSAL | Status: AC
  Filled 2023-01-16: qty 15

## 2023-01-16 MED ORDER — BUPIVACAINE LIPOSOME 1.3 % IJ SUSP
20.0000 mL | Freq: Once | INTRAMUSCULAR | Status: DC
  Filled 2023-01-16: qty 20

## 2023-01-16 MED ORDER — LIDOCAINE HCL (PF) 1 % IJ SOLN
INTRAMUSCULAR | Status: AC
  Administered 2023-01-16: 30 mL
  Filled 2023-01-16: qty 30

## 2023-01-16 MED ORDER — SODIUM CHLORIDE 0.9 % IV SOLN
2.0000 g | INTRAVENOUS | Status: DC
  Administered 2023-01-16 – 2023-01-17 (×2): 2 g via INTRAVENOUS
  Filled 2023-01-16 (×2): qty 20

## 2023-01-16 NOTE — Progress Notes (Addendum)
Nutrition Follow-up  DOCUMENTATION CODES:   Non-severe (moderate) malnutrition in context of chronic illness  INTERVENTION:   When PEG replaced, resume same TF: Osmolite 1.2 at 55 ml/h. Provides 1584 kcal, 73 gm protein, 1082 ml free water daily.  For free water flushes, recommend 125 ml every 6 hours for a total of 1582 ml free water daily.  Recommend diet advancement per SLP as able.   NUTRITION DIAGNOSIS:   Moderate Malnutrition related to chronic illness (stroke, dysphagia) as evidenced by mild fat depletion, moderate fat depletion, moderate muscle depletion, severe muscle depletion.  Ongoing   GOAL:   Patient will meet greater than or equal to 90% of their needs  Unmet, TF off.    MONITOR:   TF tolerance, Diet advancement, Labs, Weight trends  REASON FOR ASSESSMENT:   Consult Enteral/tube feeding initiation and management  ASSESSMENT:   89 yo female with L sided weakness and confusion with SAH. Pt with recent discharge from the hospital after admission for acute R MCA infarct. Pt with hx of GERD, HTN. Pt has PEG tube in place  Discussed patient in ICU rounds and with RN today. PEG tube was dislodged yesterday. Foley tube was put in PEG site. TF is currently off. Plans to replace PEG today in IR. S/P I&D of left abdominal wall infection today.   SLP recommended dysphagia 1 diet with thin liquids 12/15, but patient remains NPO for procedure later today. SLP to continue to follow.   Labs reviewed.  CBG: [phone]  Medications reviewed and include oscal with D, ferrous sulfate, novolog, senokot-s.  Admit weight 64.3 kg Current weight 63.2 kg  Patient meets criteria for moderate malnutrition, given mild - moderate depletion of subcutaneous fat mass and moderate - severe depletion of muscle mass.  NUTRITION - FOCUSED PHYSICAL EXAM:  Flowsheet Row Most Recent Value  Orbital Region Mild depletion  Upper Arm Region Mild depletion  Thoracic and Lumbar Region  No depletion  Buccal Region Moderate depletion  Temple Region Moderate depletion  Clavicle Bone Region Severe depletion  Clavicle and Acromion Bone Region Severe depletion  Scapular Bone Region Severe depletion  Dorsal Hand Mild depletion  Patellar Region Unable to assess  Anterior Thigh Region Severe depletion  Posterior Calf Region Severe depletion  Edema (RD Assessment) Mild  Hair Reviewed  Eyes Reviewed  Mouth Reviewed  Skin Reviewed  Nails Reviewed       Diet Order:   Diet Order             Diet NPO time specified  Diet effective now                   EDUCATION NEEDS:   Not appropriate for education at this time  Skin:  Skin Assessment: Reviewed RN Assessment (no pressure injuries)  Last BM:  12/16 type 6  Height:   Ht Readings from Last 1 Encounters:  01/13/23 5\' 3"  (1.6 m)    Weight:   Wt Readings from Last 1 Encounters:  01/16/23 63.2 kg    BMI:  Body mass index is 24.68 kg/m.  Estimated Nutritional Needs:   Kcal:  1450-1650 kcals  Protein:  70-80 g  Fluid:  >/= 1.5 L   Gabriel Rainwater RD, LDN, CNSC Please refer to Amion for contact information.

## 2023-01-16 NOTE — Procedures (Signed)
Incision and Drainage  Date/Time: 01/16/2023 9:31 AM  Performed by: Violeta Gelinas, MD Authorized by: Violeta Gelinas, MD   Consent:    Consent obtained:  Written   Risks, benefits, and alternatives were discussed: yes   Universal protocol:    Procedure explained and questions answered to patient or proxy's satisfaction: yes     Relevant documents present and verified: yes     Test results available : yes     Imaging studies available: yes     Required blood products, implants, devices, and special equipment available: yes     Site/side marked: yes     Immediately prior to procedure, a time out was called: yes     Patient identity confirmed:  Arm band Location:    Type:  Abscess   Location:  Trunk   Trunk location:  Abdomen Pre-procedure details:    Skin preparation:  Chlorhexidine with alcohol Sedation:    Sedation type:  None Anesthesia:    Anesthesia method:  Local infiltration Procedure type:    Complexity:  Simple Procedure details:    Ultrasound guidance: no     Needle aspiration: no     Incision types:  Single straight   Incision depth:  Subcutaneous   Wound management:  Probed and deloculated   Drainage:  Purulent and bloody   Drainage amount:  Moderate   Wound treatment:  Drain placed Post-procedure details:    Procedure completion:  Tolerated  Violeta Gelinas, MD, MPH, FACS Please use [email or web address] to contact on call provider

## 2023-01-16 NOTE — Progress Notes (Signed)
PT Cancellation Note  Patient Details Name: Jocelyn [name] MRN: [id number] DOB: May 29, 1931   Cancelled Treatment:    Reason Eval/Treat Not Completed: Patient at procedure or test/unavailable.  Pt in IR on arrival.  Will check back as time allows and as pt is able.   01/16/2023  Jacinto Halim., PT Acute Rehabilitation Services [phone]  (office)   [name] 01/16/2023, 4:19 PM

## 2023-01-16 NOTE — TOC CM/SW Note (Addendum)
Transition of Care [facility]) - Inpatient Brief Assessment   Patient Details  Name: ENARA HOSKIN MRN: [id number] Date of Birth: November 17, 1931  Transition of Care [facility]) CM/SW Contact:    Glennon Mac, RN Phone Number: 01/16/2023, 11:44 AM   Clinical Narrative: Patient admitted on 01/13/2023 from [facility] for acute stroke symptoms.  Per EMS she had new onset L side facial droop, numbness to L arm, and significantly increased weakness to L arm. CTH showed interval development of scattered subarachnoid hemorrhage in the right sylvian fissure. Patient's PEG tube found to be dislodged upon admission.    Transition of Care Asessment: Insurance and Status: Insurance coverage has been reviewed Patient has primary care physician: Yes (Dr. Hoy Morn) Home environment has been reviewed: Facility resident/ at [facility] Prior level of function:: Requires assistance Prior/Current Home Services: No current home services Social Drivers of Health Review: SDOH reviewed no interventions necessary Readmission risk has been reviewed: Yes Transition of care needs: transition of care needs identified, TOC will continue to follow  [name], RN, BSN  Trauma/Neuro ICU Case Manager [phone]

## 2023-01-16 NOTE — Progress Notes (Addendum)
STROKE TEAM PROGRESS NOTE   BRIEF HPI [name] is a 89 y.o. female with history of Arthritis of both knees, Colitis, Essential hypertension, GERD, iron deficiency anemia, Hyperlipidemia, Lumbar pain, OSA on CPAP (08/01/2015), Overweight, Prediabetes, and SOB., atrial fibrillation (on Eliquis) who was recently discharged from the Stroke service at [facility] on 12/11 after management of an acute right MCA territory infarct in the setting of atrial fibrillation. She had presented at that time with left-sided weakness and confusion. Imaging had revealed a right A1 and M2 occlusion and she underwent successful mechanical thrombectomy, with TICI 3 revascularization. which was successful. Her Eliquis was restarted on 12/10 for her chronic A-fib. She was initially unable to pass her swallow study, and PEG tube was inserted on 12/9 with tube feeding started, although patient did pass swallow study afterwards and was beginning to take foods by mouth at the time of her discharge. She did have some pain in her left knee, which was successfully treated with lidocaine patches. She was discharged to a SNF.   She re-presented to the ED at [facility] on Friday from [facility] rehab facility for acute stroke symptoms. LKN was 1830. She had been at [facility] for LUE weakness. Per EMS she had new onset L side facial droop, numbness to L arm, and significantly increased weakness to L arm. CTH showed interval development of scattered subarachnoid hemorrhage in the right sylvian fissure. Teleneurology was consulted. NIHSS was 17. Teleneurology advised DOAC reversal with KCentra, BP management, EEG for observed facial twitching and admission as well as Neurosurgery consult. Neurosurgery recommended Keppra 500 mg. CTA showed no LVO; atheromatous plaque was again noted. 3-4 mm outpouchings extending posteriorly and inferiorly from both supraclinoid ICAs were noted, which Radiology felt could reflect small aneurysms versus vascular  infundibula; the outpouchings were similar in appearance compared to recent MRA from 12/30/2022.  NIH on Admission 17  SIGNIFICANT HOSPITAL EVENTS 12/11- Discharged to rehab 12/14- Admitted with [facility] 12/15-dislodged PEG tube, plan to replace and IR on 12/16 12/16-transferred out of the ICU, incision and drainage of left abdominal wall infection.  INTERIM HISTORY/SUBJECTIVE  Patient dislodged her PEG tube yesterday, Foley catheter was inserted into tract, and plan is for tube replacement in IR later today.  She has undergone an incision and drainage of an infection in her left abdominal wall by Dr. Janee Morn from trauma team.  Tmax is 100.2, and patient has remained hemodynamically stable.  She is ready to be transferred out of the ICU today.  She did have a 10 beat run of V. tach which terminated spontaneously, will recheck electrolytes to a certain that there are no abnormalities.  OBJECTIVE  CBC    Component Value Date/Time   WBC 19.2 (H) 01/15/2023 0623   RBC 3.55 (L) 01/15/2023 0623   HGB 9.8 (L) 01/15/2023 0623   HGB 12.5 09/19/2022 0916   HCT 29.9 (L) 01/15/2023 0623   HCT 39.6 09/19/2022 0916   PLT 263 01/15/2023 0623   PLT 169 09/19/2022 0916   MCV 84.2 01/15/2023 0623   MCV 87 09/19/2022 0916   MCV 82 04/13/2011 1125   MCH 27.6 01/15/2023 0623   MCHC 32.8 01/15/2023 0623   RDW 14.6 01/15/2023 0623   RDW 12.9 09/19/2022 0916   RDW 14.4 04/13/2011 1125   LYMPHSABS 1.3 01/13/2023 2020   LYMPHSABS 3.7 (H) 07/08/2015 1136   MONOABS 0.7 01/13/2023 2020   EOSABS 0.1 01/13/2023 2020   EOSABS 0.2 07/08/2015 1136   BASOSABS 0.0  01/13/2023 2020   BASOSABS 0.1 07/08/2015 1136    BMET    Component Value Date/Time   NA 135 01/15/2023 0901   NA 143 12/07/2022 1535   NA 141 04/13/2011 1125   K 4.6 01/15/2023 0901   K 3.4 (L) 04/13/2011 1125   CL 107 01/15/2023 0901   CL 105 04/13/2011 1125   CO2 18 (L) 01/15/2023 0901   CO2 29 04/13/2011 1125   GLUCOSE 205 (H)  01/15/2023 0901   GLUCOSE 111 (H) 04/13/2011 1125   BUN 47 (H) 01/15/2023 0901   BUN 16 12/07/2022 1535   BUN 14 04/13/2011 1125   CREATININE 0.86 01/15/2023 0901   CREATININE 0.80 08/01/2018 1544   CALCIUM 8.5 (L) 01/15/2023 0901   CALCIUM 9.2 04/13/2011 1125   EGFR 74 12/07/2022 1535   GFRNONAA >60 01/15/2023 0901   GFRNONAA 67 08/01/2018 1544    IMAGING past 24 hours CT ABDOMEN WO CONTRAST Result Date: 01/16/2023 CLINICAL DATA:  Peg tube placement. EXAM: CT ABDOMEN WITHOUT CONTRAST TECHNIQUE: Multidetector CT imaging of the abdomen was performed following the standard protocol without IV contrast. RADIATION DOSE REDUCTION: This exam was performed according to the departmental dose-optimization program which includes automated exposure control, adjustment of the mA and/or kV according to patient size and/or use of iterative reconstruction technique. COMPARISON:  CT abdomen 01/16/2023 FINDINGS: Lower chest: There is atelectasis in the left lung base. The heart is enlarged. Hepatobiliary: Gallbladder is surgically absent. There is no biliary ductal dilatation. There is a 3.1 cm cyst in the inferior right liver which is unchanged. Pancreas: Unremarkable. No pancreatic ductal dilatation or surrounding inflammatory changes. Spleen: Normal in size without focal abnormality. Adrenals/Urinary Tract: There is residual contrast in the bilateral renal collecting systems. No hydronephrosis or perinephric fluid. Adrenal glands are within normal limits. Stomach/Bowel: Again seen is a percutaneous gastrostomy tube in place in the body of the stomach. Contrast is seen within nondilated stomach. There is a tract of contrast extravasation within the subcutaneous tissues left of the catheter which appears unchanged from prior. There is a small hiatal hernia. No dilated bowel loops are seen. Vascular/Lymphatic: Aortic atherosclerosis. No enlarged abdominal lymph nodes. Other: Extensive anterior abdominal wall  subcutaneous emphysema again noted. No focal fluid collections or abdominal wall hernia. Musculoskeletal: Degenerative changes affect the spine. IMPRESSION: 1. Percutaneous gastrostomy tube in place in the body of the stomach. There is a tract of contrast extravasation within the subcutaneous tissues left of the catheter which appears unchanged from prior. 2. Extensive anterior abdominal wall subcutaneous emphysema again noted. Aortic Atherosclerosis (ICD10-I70.0). Electronically Signed   By: Darliss Cheney M.D.   On: 01/16/2023 00:41   CT ABDOMEN WO CONTRAST Result Date: 01/16/2023 CLINICAL DATA:  Status post percutaneous gastrostomy placement. Abdominal pain. EXAM: CT ABDOMEN WITHOUT CONTRAST TECHNIQUE: Multidetector CT imaging of the abdomen was performed following the standard protocol without IV contrast. RADIATION DOSE REDUCTION: This exam was performed according to the departmental dose-optimization program which includes automated exposure control, adjustment of the mA and/or kV according to patient size and/or use of iterative reconstruction technique. COMPARISON:  01/15/2023 FINDINGS: Lower chest: Cardiomegaly. Extensive calcification of the mitral valve annulus. Right coronary artery calcification. Moderate hiatal hernia. Eventration of the left posterior hemidiaphragm. Mild left basilar atelectasis. Hepatobiliary: Simple cyst within the inferior right hepatic lobe. Liver otherwise unremarkable this noncontrast examination. Status post cholecystectomy. Mild extrahepatic biliary ductal dilation is stable likely representing post cholecystectomy change Pancreas: Unremarkable Spleen: Unremarkable Adrenals/Urinary Tract: The adrenal glands  are unremarkable. The kidneys are normal in size and position. Excreted contrast within the renal collecting system bilaterally. No hydronephrosis. Stomach/Bowel: The previously noted button type pertain is gastrostomy has been removed and replaced with a small bore  catheter which extends only into the subcutaneous fat of the anterior abdominal wall along the gastrostomy tract. Injected contrast opacifies a tortuous and irregular tract which extends to the gastric lumen as well as extravasates laterally to the left within subcutaneous fat of the anterior abdominal wall in keeping with a partially disrupted or immature, but persistent tract. There is no intraperitoneal extension of contrast. Extensive subcutaneous gas is seen with anterior abdominal wall, similar prior examination. The visualized stomach, small bowel, and large bowel are otherwise unremarkable save for moderate distal colonic diverticulosis, partially visualized. No free intraperitoneal gas. Vascular/Lymphatic: Extensive aortoiliac atherosclerotic calcification. Reviewed prominent atherosclerotic calcification within the proximal superior mesenteric artery and celiac axis again noted, likely resulting in hemodynamically significant stenoses. No pathologic adenopathy. Other: None significant Musculoskeletal: Advanced degenerative changes are seen within the visualized thoracolumbar spine with superimposed levoscoliosis. No acute bone abnormality. IMPRESSION: 1. Interval removal of previously noted button type gastrostomy and placement of a small bore catheter which extends only into the subcutaneous fat of the anterior abdominal wall along the gastrostomy tract. Injected contrast opacifies a tortuous and irregular tract which extends to the gastric lumen as well as extravasates laterally to the left within subcutaneous fat of the anterior abdominal wall in keeping with a partially disrupted or immature, but persistent tract. No intraperitoneal extension of contrast. No free intraperitoneal gas. 2. Stable extensive subcutaneous gas within the anterior abdominal wall of 3. Cardiomegaly. Right coronary artery calcification. 4. Extensive atherosclerotic calcification within the proximal superior mesenteric artery and  celiac axis again noted, likely resulting in hemodynamically significant stenoses. If there is clinical evidence of chronic mesenteric ischemia, this would be better assessed with CT arteriography. 5. Moderate distal colonic diverticulosis, partially visualized. Aortic Atherosclerosis (ICD10-I70.0). Electronically Signed   By: Helyn Numbers M.D.   On: 01/16/2023 00:30   DG ABDOMEN PEG TUBE LOCATION Result Date: 01/16/2023 CLINICAL DATA:  Peg tube adjustment EXAM: ABDOMEN - 1 VIEW COMPARISON:  CT 01/15/2023 FINDINGS: Contrast opacifies the stomach. There is enteral contrast within the bowel as was noted on previous CT. Large amount of subcutaneous emphysema limits the exam. No frank extravasation is seen. IMPRESSION: Large amount of soft tissue emphysema as well as presence of pre-existing contrast limits the exam. The gastrostomy balloon is poorly visible but injected contrast opacifies the stomach. Difficult to exclude contrast within gastrostomy tract given appearance on prior CT versus small volume extravasated contrast on the subsequent image obtained after flushing. Repeat limited abdominal CT could be obtained to reassess tube position given the described limitations. Electronically Signed   By: Jasmine Pang M.D.   On: 01/16/2023 00:07   CT ABDOMEN PELVIS W CONTRAST Addendum Date: 01/15/2023 ADDENDUM REPORT: 01/15/2023 23:44 ADDENDUM: Additional finding. Slightly dense mass or enlargement of the left iliacus muscle compared to the right measuring 6.9 x 3.5 cm. This could reflect an intramuscular hematoma. Correlate for any history of trauma, and attention on follow-up imaging. Electronically Signed   By: Jasmine Pang M.D.   On: 01/15/2023 23:44   Result Date: 01/15/2023 CLINICAL DATA:  Nonlocalized abdomen pain EXAM: CT ABDOMEN AND PELVIS WITH CONTRAST TECHNIQUE: Multidetector CT imaging of the abdomen and pelvis was performed using the standard protocol following bolus administration of  intravenous contrast. RADIATION DOSE REDUCTION:  This exam was performed according to the departmental dose-optimization program which includes automated exposure control, adjustment of the mA and/or kV according to patient size and/or use of iterative reconstruction technique. CONTRAST:  75mL OMNIPAQUE IOHEXOL 350 MG/ML SOLN COMPARISON:  None Available. FINDINGS: Lower chest: Lung bases demonstrate mild consolidation in the left base. Cardiomegaly with coronary vascular calcification. Mitral calcification. Small hiatal hernia Hepatobiliary: Cholecystectomy. No focal hepatic abnormality. 12 mm common bile duct likely due to postsurgical change. Pancreas: Unremarkable. No pancreatic ductal dilatation or surrounding inflammatory changes. Spleen: Normal in size without focal abnormality. Adrenals/Urinary Tract: Adrenal glands are within normal limits. Kidneys show no hydronephrosis. The bladder is unremarkable. Stomach/Bowel: Gastrostomy tube balloon is position within the subcutaneous soft tissues of the anterior abdominal wall with gas in the gastrostomy tube tract leading to the stomach. Large volume subcutaneous emphysema within the anterior abdominal wall. Enteral contrast otherwise within small and large bowel. No bowel obstruction or bowel wall thickening. Diverticular disease of the sigmoid colon. Vascular/Lymphatic: Advanced aortic atherosclerosis. No aneurysm. No suspicious lymph nodes. Reproductive: Hysterectomy. No adnexal mass. Some contrast within the vaginal cuff Other: Negative for pelvic effusion or free air. Musculoskeletal: Scoliosis and degenerative changes of the spine. No acute osseous abnormality. IMPRESSION: 1. Malposition percutaneous gastrostomy tube with balloon visualized within the subcutaneous soft tissues of the anterior abdominal wall and contiguous with gas-filled gastrostomy tube tract extending to the stomach. Large volume subcutaneous emphysema within the anterior abdominal wall. 2.  No evidence for a bowel obstruction, enteral contrast reaches the rectum. There is some contrast present within the vaginal cuff, but no directly visualized enterovaginal fistula is identified. 3. Sigmoid colon diverticular disease without acute inflammatory process. These results will be called to the ordering clinician or representative by the Radiologist Assistant, and communication documented in the PACS or [employer]. Electronically Signed: By: Jasmine Pang M.D. On: 01/15/2023 23:11   CT HEAD WO CONTRAST ( ) Result Date: 01/15/2023 CLINICAL DATA:  Follow-up examination for stroke. EXAM: CT HEAD WITHOUT CONTRAST TECHNIQUE: Contiguous axial images were obtained from the base of the skull through the vertex without intravenous contrast. RADIATION DOSE REDUCTION: This exam was performed according to the departmental dose-optimization program which includes automated exposure control, adjustment of the mA and/or kV according to patient size and/or use of iterative reconstruction technique. COMPARISON:  Prior studies from 01/13/2023 FINDINGS: Brain: Age-related cerebral atrophy with chronic small vessel ischemic disease. Evolving subacute right MCA distribution infarct again noted, grossly similar to prior. Presumed hemorrhagic transformation with small volume subarachnoid hemorrhage within the right sylvian fissure, decreased from prior. No new intracranial hemorrhage. No other acute large vessel territory infarct. No mass lesion or midline shift. No hydrocephalus or extra-axial fluid collection. Vascular: No abnormal hyperdense vessel. Calcified atherosclerosis present at the skull base. Skull: Scalp soft tissues and calvarium demonstrate no new finding. Sinuses/Orbits: Globes and orbital soft tissues within normal limits. Paranasal sinuses and mastoid air cells are largely clear. Other: None. IMPRESSION: 1. Evolving subacute right MCA distribution infarct, grossly similar to prior. Presumed  hemorrhagic transformation with small volume subarachnoid hemorrhage within the right Sylvian fissure, decreased from prior. 2. No other new acute intracranial abnormality. Electronically Signed   By: Rise Mu M.D.   On: 01/15/2023 23:10    Vitals:   01/16/23 0900 01/16/23 1000 01/16/23 1100 01/16/23 1200  BP: (!) 118/51 (!) 125/50 (!) 119/57 (!) 126/45  Pulse: (!) 101 (!) 104 (!) 103 92  Resp: (!) 22 (!) 27 (!) 29 (!)  31  Temp:   99.4 F (37.4 C)   TempSrc:   Axillary   SpO2: 95% 97% 98% 98%  Weight:         PHYSICAL EXAM General:  Alert, well-nourished, well-developed patient in no acute distress Psych:  Mood and affect appropriate for situation CV: Regular rate and rhythm on monitor Respiratory:  Regular, unlabored respirations on room air GI: Abdomen soft and nontender  Neurological Examination Mental Status: Awakens to voice. Able to give her name but unable to correctly answer  orientation questions. Able to protrude tongue and weakly squeeze examiner's hand to command. Severe dysarthria. Only with one-word attempts to answer questions.  Cranial Nerves: II:  PERRL   III,IV, VI: No ptosis.Eyes are conjugate.  V: Reacts to touch bilaterally  VII: Smiles symmetrically to command  VIII: Hearing intact to voice IX,X: Gag reflex deferred XI: Unable to assess due to poor comprehension of commands XII: Midline tongue extension Motor/Sensory: RUE: Weakly grips examiner's hand. Elevates forearm antigravity.  LUE: Weakly moves left upper extremity to command BLE: weakly moves bilateral lower extremities to command  Cerebellar: Unable to assess Gait: Unable to assess    ASSESSMENT/PLAN  Subarachnoid Hemorrhage :  scattered SAH in right sylvian fissure Etiology:  eliquis   Code Stroke CT head - Interval development of scattered small volume subarachnoid hemorrhage within the right Sylvian fissure. No significant mass effect. Upon review of recent MRA there is  question of a 3-4 mm outpouching at the takeoff of the right PCOM, which could potentially reflect a small aneurysm versus infundibulum, not appreciated on initial interpretation. This finding is not certain, as no aneurysm is described at this location on recent arteriogram. Evolving right MCA distribution infarcts, similar to prior. Aspects is 7. CTA head & neck -  3-4 mm outpouchings extending posteriorly and inferiorly from both supraclinoid ICAs, which could reflect small aneurysms versus vascular infundibula. Appearance is similar as compared to recent MRA from 12/30/2022. Moderate atheromatous plaque about the carotid bifurcations and carotid siphons without hemodynamically significant stenosis. Atheromatous plaque at the origin of the right vertebral artery with moderate to severe stenosis. LDL 33 HgbA1c 6.4 EEG- This study is suggestive of cortical dysfunction arising from right hemisphere likely secondary to underlying structural abnormality. Additionally there is moderate diffuse encephalopathy. No seizures were seen throughout the recording. Generalized sharp transients were noted. Recommend long term monitoring for further evaluation Long-term EEG 12/15: Continuous slow, generalized and lateralized right hemisphere, suggestive of cortical dysfunction arising from right hemisphere and moderate diffuse encephalopathy, no seizures seen VTE prophylaxis - SCDs Eliquis (apixaban) daily prior to admission, now on No antithrombotic  Therapy recommendations: SNF Disposition: Transfer out of ICU today  Hx of Stroke/TIA 11/28-12/11- Acute Ischemic Infarct:  right MCA territory infarct with R M2 and A1 occlusion s/p IR with TICI 3 revascularization, etiology: Cardioembolic in the setting of A-fib  Code Stroke  CT head  -hyperdense right MCA, cytotoxic edema in right stratum and insula.  ASPECTS 7   CTA head & neck acute clot at the right terminal ICA and acute obstructing right MCA M2 and ACA A1. CT  perfusion 6/149 in the right MCA territory.  S/p IR via right ICA direct access with TICI3 MRI scattered acute right MCA distribution infarcts involving the right caudate and lentiform nuclei, right insula, and overlying right cerebral hemisphere, small volume petechial hemorrhage at the posterior right frontal region MRA  with interval revascularization of previously seen clot at the right ICA terminus  and right M2 segment, Focal moderate distal right P2 stenosis. 2D Echo EF 60 to 65%.  LV severe concentric hypertrophy.  LA severely dilated LDL 33 HgbA1c 6.4    Abnormal EEG LTM connected Routine EEG 12/14 This study is suggestive of cortical dysfunction arising from right hemisphere likely secondary to underlying structural abnormality. Additionally there is moderate diffuse encephalopathy. No seizures were seen throughout the recording. Generalized sharp transients were noted. Recommend long term monitoring for further evaluation  Atrial fibrillation Home Meds: Eliquis Reversed with Kcentra at [facility] On tele Rate controlled On hold due to [facility]   Cerebral aneurysm now with [facility] CTA- -4 mm outpouchings extending posteriorly and inferiorly from both supraclinoid ICAs, which could reflect small aneurysms versus vascular infundibula. Appearance is similar as compared to recent MRA from 12/30/2022. Moderate atheromatous plaque about the carotid bifurcations and carotid siphons without hemodynamically significant stenosis. Atheromatous plaque at the origin of the right vertebral artery with moderate to severe stenosis. Nimodipine 60mg  q4hr  Yearly MRA follow up as outpt   Hypertensive Emergency Home meds: Imdur 120 mg, olmesartan-amlodipine-HCTZ 40-10-12.5 mg, spironolactone 25 mg Amlodipine 5mg , nimodipine 60mg  q4hr BP goal: SBP 130-150 Long-term BP goal normotensive   Hyperlipidemia Home meds: Lipitor 40 mg, resumed in hospital LDL 33, goal < 70 Continue statin at discharge    Dysphagia Patient has post-stroke dysphagia, SLP consulted Continue NPO PEG Tube 12/9 Resume TF- Osmolite 1.2@55ml /hr- Initiate at 27ml/hr and increase by 67ml/hr q 8 hours until goal rate is each.  Free q6  Dietitian consulted Trace purulent drainage, tenderness around PEG- trauma re engaged CT Abd pending  PEG tube dislodged 12/15, Foley catheter placed in tract, plan is to exchange for another PEG tube on 12/16   Other Stroke Risk Factors  Obstructive sleep apnea,  on CPAP at home  Advanced age   Other acute issues Left knee pain - lidocaine patch - arthritis in bilateral knees- f/u outpatient  10 beat run of V. tach on 12/16-spontaneously terminated, will recheck BMP and magnesium Left abdominal wall infection-I&D performed 12/16  Other Active Problems Leukocytosis WBC 17.5 -> 15.0 -> 19.2 Tmax 100.33F Blood cultures pending CXR, UA ordered  Trend Completed 7 days of ceftriaxone during last hospitalization due to aspiration pna Transaminitis 176/286 -> 123/208 Trend  Hospital day # 2   Patient seen and examined by NP/APP with MD. MD to update note as needed.   Cortney E Ernestina Columbia , MSN, AGACNP-BC [facility] See Amion for schedule and pager information 01/16/2023 1:19 PM  STROKE MD NOTE :  I have personally obtained history,examined this patient, reviewed notes, independently viewed imaging studies, participated in medical decision making and plan of care.ROS completed by me personally and pertinent positives fully documented  I have made any additions or clarifications directly to the above note. Agree with note above.  Patient with recent cardioembolic stroke a few weeks ago was sent to rehab on Eliquis 5 mg twice daily presented with worsening of left-sided deficits and CT/MRI showed right parietal subarachnoid hemorrhage with small paralabral component likely related to anticoagulation with Eliquis.  Hold anticoagulation for now but patient does  remain at future risk for further embolic strokes.  No family available at the bedside for discussion.  May need to make decision about resuming anticoagulation at discharge.  Patient may not be a good candidate for Watchman device given her poor functional baseline.  And discussed possible participation in ASPIRE study (aspirin versus Eliquis and patient with  A-fib and intracerebral hemorrhage on anticoagulation) .  Continue ongoing therapies.  Mobilize out of bed.  Strict control of hypertension systolic goal below 160.  Transfer to neurology floor bed after PEG tube is replaced by IR.  Long discussion with patient and answered questions.  Discussed with Dr. Janee Morn.  Greater than 50% time during this 50-minute visit was spent in counseling and coordination of care and discussion patient and care team and answering questions  Delia Heady, MD Medical Director [facility] Pager: [phone] 01/16/2023 2:42 PM   To contact Stroke Continuity provider, please refer to [email or web address]. After hours, contact General Neurology

## 2023-01-16 NOTE — Progress Notes (Signed)
Notified by Radiology of dislodged PEG tube on CT. Reviewed images and it appears the tract to the stomach is patent and communicates with the PEG. No extravasation of contrast in the peritoneal cavity. Came to bedside and removed the PEG tube, replaced with 35F Foley. XR with contrast reviewed by me and suggests gastric placement as the stomach opacifies appropriately and the tube flushes and pulls back, but given the CT appearance, I elected to perform cross-sectional imaging to confirm. On CT, the  35F was also still in the subcutaneous tissues. This was removed and a 75F Foley placed while in CT and repeat CT performed with what appears to be intra-gastric placement of the balloon. Will await final radiology read. Remain strict NPO and plan for IR tube exchange later this AM. Substantial gas and fluid in the subcutaneous tissues, will monitor closely for need for washout. Called daughter Gilford Raid and notified her of the clinical change.   Diamantina Monks, MD General and Trauma Surgery [facility] Surgery

## 2023-01-16 NOTE — Progress Notes (Signed)
Patient had run of Vtach.  12 Lead EKG obtained by RN.  MD Pearlean Brownie and NP Cortney were notified.  Labs were ordered.

## 2023-01-16 NOTE — Progress Notes (Signed)
SLP Cancellation Note  Patient Details Name: Jocelyn [name] MRN: [id number] DOB: 1931/03/31   Cancelled treatment:       Reason Eval/Treat Not Completed: Patient at procedure or test/unavailable- NPO pending IR procedure this afternoon. SLP will continue efforts.  Dalan Cowger L. Samson Frederic, MA CCC/SLP Clinical Specialist - Acute Care SLP Acute Rehabilitation Services Office number [phone]    Blenda Mounts Laurice 01/16/2023, 12:33 PM

## 2023-01-16 NOTE — Progress Notes (Signed)
Patient ID: Jocelyn [name], female   DOB: 1931-08-31, 89 y.o.   MRN: [id number] PEG tube was dislodged. Replaced by a foley by Dr. Bedelia Person. There is some subcutaneous abscess also. It is draining well around the foley but there is an erythematous area laterally. I called her daughter and got consent to do an I&D. See note. Bloody purulent fluid sent for culture. Start Rocephin. We will follow. I also D/W Dr. Pearlean Brownie at the bedside.  Violeta Gelinas, MD, MPH, FACS Please use [email or web address] to contact on call provider

## 2023-01-16 NOTE — Progress Notes (Signed)
OT Cancellation Note  Patient Details Name: Jocelyn [name] MRN: [id number] DOB: [birth year]   Cancelled Treatment:    Reason Eval/Treat Not Completed: Patient at procedure or test/ unavailable;Patient not medically ready (Per RN, pt to go to IR for PEG this am. OT evaluation to f/u when appropriate.)  Donia Pounds 01/16/2023, 9:03 AM

## 2023-01-17 LAB — COMPREHENSIVE METABOLIC PANEL
ALT: 354 U/L — ABNORMAL HIGH (ref 0–44)
AST: 427 U/L — ABNORMAL HIGH (ref 15–41)
Albumin: 1.9 g/dL — ABNORMAL LOW (ref 3.5–5.0)
Alkaline Phosphatase: 104 U/L (ref 38–126)
Anion gap: 7 (ref 5–15)
BUN: 36 mg/dL — ABNORMAL HIGH (ref 8–23)
CO2: 19 mmol/L — ABNORMAL LOW (ref 22–32)
Calcium: 8.3 mg/dL — ABNORMAL LOW (ref 8.9–10.3)
Chloride: 110 mmol/L (ref 98–111)
Creatinine, Ser: 0.63 mg/dL (ref 0.44–1.00)
GFR, Estimated: >60 mL/min (ref >60)
Glucose, Bld: 141 mg/dL — ABNORMAL HIGH (ref 70–99)
Potassium: 4.1 mmol/L (ref 3.5–5.1)
Sodium: 136 mmol/L (ref 135–145)
Total Bilirubin: 0.9 mg/dL (ref <1.2)
Total Protein: 6.5 g/dL (ref 6.5–8.1)

## 2023-01-17 LAB — BLOOD CULTURE ID PANEL (REFLEXED) - BCID2

## 2023-01-17 LAB — CBC
HCT: 26.3 % — ABNORMAL LOW (ref 36.0–46.0)
Hemoglobin: 8.8 g/dL — ABNORMAL LOW (ref 12.0–15.0)
MCH: 27.5 pg (ref 26.0–34.0)
MCHC: 33.5 g/dL (ref 30.0–36.0)
MCV: 82.2 fL (ref 80.0–100.0)
Platelets: 389 10*3/uL (ref 150–400)
RBC: 3.2 MIL/uL — ABNORMAL LOW (ref 3.87–5.11)
RDW: 14.6 % (ref 11.5–15.5)
WBC: 15.4 10*3/uL — ABNORMAL HIGH (ref 4.0–10.5)
nRBC: 0 % (ref 0.0–0.2)

## 2023-01-17 LAB — GLUCOSE, CAPILLARY
Glucose-Capillary: 125 mg/dL — ABNORMAL HIGH (ref 70–99)
Glucose-Capillary: 139 mg/dL — ABNORMAL HIGH (ref 70–99)
Glucose-Capillary: 143 mg/dL — ABNORMAL HIGH (ref 70–99)
Glucose-Capillary: 148 mg/dL — ABNORMAL HIGH (ref 70–99)
Glucose-Capillary: 174 mg/dL — ABNORMAL HIGH (ref 70–99)
Glucose-Capillary: 181 mg/dL — ABNORMAL HIGH (ref 70–99)

## 2023-01-17 MED ORDER — HEPARIN SODIUM (PORCINE) 5000 UNIT/ML IJ SOLN
5000.0000 [IU] | Freq: Three times a day (TID) | INTRAMUSCULAR | Status: DC
Start: 2023-01-17 — End: 2023-02-07
  Administered 2023-01-17 – 2023-02-07 (×64): 5000 [IU] via SUBCUTANEOUS
  Filled 2023-01-17 (×64): qty 1

## 2023-01-17 MED ORDER — SODIUM CHLORIDE 0.9 % IV SOLN
2.0000 g | Freq: Two times a day (BID) | INTRAVENOUS | Status: AC
  Administered 2023-01-17 – 2023-01-24 (×14): 2 g via INTRAVENOUS
  Filled 2023-01-17 (×14): qty 12.5

## 2023-01-17 NOTE — Progress Notes (Signed)
Trauma/Critical Care Follow Up Note  Subjective:    Overnight Issues:   Objective:  Vital signs for last 24 hours: Temp:  [98.2 F (36.8 C)-99.4 F (37.4 C)] 98.3 F (36.8 C) (12/17 0800) Pulse Rate:  [84-116] 90 (12/17 0900) Resp:  [14-35] 35 (12/17 0900) BP: (100-131)/(45-98) 131/59 (12/17 0900) SpO2:  [94 %-100 %] 98 % (12/17 0900) Weight:  [64.3 kg] 64.3 kg (12/17 0500)  Hemodynamic parameters for last 24 hours:    Intake/Output from previous day: 12/16 0701 - 12/17 0700 In: 1191.7 [P.O.:150; NG/GT:941.7; IV Piggyback:100] Out: 350 [Urine:350]  Intake/Output this shift: Total I/O In: 100 [NG/GT:100] Out: -   Vent settings for last 24 hours:    Physical Exam:  Gen: comfortable, no distress Neuro: alert, communicative HEENT: PERRL Neck: supple CV: RRR Pulm: unlabored breathing on RA Abd: soft, NT   ,    GU: urine clear and yellow, +spontaneous voids Extr: wwp, no edema  Results for orders placed or performed during the hospital encounter of 01/14/23 (from the past 24 hours)  Glucose, capillary     Status: Abnormal   Collection Time: 01/16/23 11:57 AM  Result Value Ref Range   Glucose-Capillary 112 (H) 70 - 99 mg/dL  Basic metabolic panel     Status: Abnormal   Collection Time: 01/16/23 12:30 PM  Result Value Ref Range   Sodium 138 135 - 145 mmol/L   Potassium 4.6 3.5 - 5.1 mmol/L   Chloride 110 98 - 111 mmol/L   CO2 19 (L) 22 - 32 mmol/L   Glucose, Bld 113 (H) 70 - 99 mg/dL   BUN 38 (H) 8 - 23 mg/dL   Creatinine, Ser 1.61 0.44 - 1.00 mg/dL   Calcium 8.7 (L) 8.9 - 10.3 mg/dL   GFR, Estimated >09 >60 mL/min   Anion gap 9 5 - 15  Magnesium     Status: Abnormal   Collection Time: 01/16/23 12:30 PM  Result Value Ref Range   Magnesium 2.6 (H) 1.7 - 2.4 mg/dL  Glucose, capillary     Status: Abnormal   Collection Time: 01/16/23  4:23 PM  Result Value Ref Range   Glucose-Capillary 108 (H) 70 - 99 mg/dL  Glucose, capillary     Status: Abnormal    Collection Time: 01/16/23  7:32 PM  Result Value Ref Range   Glucose-Capillary 114 (H) 70 - 99 mg/dL  Glucose, capillary     Status: Abnormal   Collection Time: 01/16/23 11:21 PM  Result Value Ref Range   Glucose-Capillary 139 (H) 70 - 99 mg/dL  Glucose, capillary     Status: Abnormal   Collection Time: 01/17/23  3:23 AM  Result Value Ref Range   Glucose-Capillary 139 (H) 70 - 99 mg/dL  Comprehensive metabolic panel     Status: Abnormal   Collection Time: 01/17/23  4:39 AM  Result Value Ref Range   Sodium 136 135 - 145 mmol/L   Potassium 4.1 3.5 - 5.1 mmol/L   Chloride 110 98 - 111 mmol/L   CO2 19 (L) 22 - 32 mmol/L   Glucose, Bld 141 (H) 70 - 99 mg/dL   BUN 36 (H) 8 - 23 mg/dL   Creatinine, Ser 4.54 0.44 - 1.00 mg/dL   Calcium 8.3 (L) 8.9 - 10.3 mg/dL   Total Protein 6.5 6.5 - 8.1 g/dL   Albumin 1.9 (L) 3.5 - 5.0 g/dL   AST 098 (H) 15 - 41 U/L   ALT 354 (H) 0 - 44  U/L   Alkaline Phosphatase 104 38 - 126 U/L   Total Bilirubin 0.9 <1.2 mg/dL   GFR, Estimated >13 >24 mL/min   Anion gap 7 5 - 15  CBC     Status: Abnormal   Collection Time: 01/17/23  4:39 AM  Result Value Ref Range   WBC 15.4 (H) 4.0 - 10.5 K/uL   RBC 3.20 (L) 3.87 - 5.11 MIL/uL   Hemoglobin 8.8 (L) 12.0 - 15.0 g/dL   HCT 40.1 (L) 02.7 - 25.3 %   MCV 82.2 80.0 - 100.0 fL   MCH 27.5 26.0 - 34.0 pg   MCHC 33.5 30.0 - 36.0 g/dL   RDW 66.4 40.3 - 47.4 %   Platelets 389 150 - 400 K/uL   nRBC 0.0 0.0 - 0.2 %  Glucose, capillary     Status: Abnormal   Collection Time: 01/17/23  7:38 AM  Result Value Ref Range   Glucose-Capillary 181 (H) 70 - 99 mg/dL    Assessment & Plan: The plan of care was discussed with the bedside nurse for the day, who is in agreement with this plan and no additional concerns were raised.   Present on Admission:  ICH (intracerebral hemorrhage) (HCC)    LOS: 3 days   Additional comments:I reviewed the patient's new clinical lab test results.   and I reviewed the patients new  imaging test results.     Abdominal wall abscess S/p PEG placement 12/9 Dr. Bedelia Person. PEG dislodged, tract maintained, able to get a foley into the tract. Exchanged for IR PEG 12/16. Okay to use for feeds/meds/flushes.  S/p bedside I&D of abdominal wall with penrose drain placement 12/16 by Dr. Janee Morn. WBC downtrending.   FEN - NPO, hold tube feeds VTE - SCD's, DOAC held in the setting of ICH ID - UA 12/14 negative, CXR 12/14 negative    Transaminitis - AST 123, ALT 208, alk phos and bilirubin are WNL; gallbladder is surgically absent  R SAH  Cerebral aneurysm  HTN emergency Hx ischemic R MCA stroke  A.fib  HLD ODA Osteoarthritis     Diamantina Monks, MD Trauma & General Surgery Please use [email or web address] to contact on call provider  01/17/2023  *Care during the described time interval was provided by me. I have reviewed this patient's available data, including medical history, events of note, physical examination and test results as part of my evaluation.

## 2023-01-17 NOTE — Progress Notes (Signed)
PHARMACY - PHYSICIAN COMMUNICATION CRITICAL VALUE ALERT - BLOOD CULTURE IDENTIFICATION (BCID)  Jocelyn [name] is an 89 y.o. female who presented to [facility] on 01/14/2023 with a chief complaint of worsening L-sided weakness  Assessment: 32 YOM with recent acute R-MCA CVA with progression of SAH. Noted PEG tube dislodged and replaced with noted subcutaneous abscess - s/p I&D with cultures sent 12/16. Now with 1 of 3 BCx from 12/15 growing GNR with BCID detecting Enterobacter cloacae complex - could be seeded from abdominal site.   Name of physician (or Provider) Contacted: Pearlean Brownie  Current antibiotics: Ceftriaxone  Changes to prescribed antibiotics recommended:  Adjust to Cefepime 2g IV every 12 hours  Results for orders placed or performed during the hospital encounter of 01/14/23  Blood Culture ID Panel (Reflexed) (Collected: 01/15/2023 12:15 PM)  Result Value Ref Range   Enterococcus faecalis NOT DETECTED NOT DETECTED   Enterococcus Faecium NOT DETECTED NOT DETECTED   Listeria monocytogenes NOT DETECTED NOT DETECTED   Staphylococcus species NOT DETECTED NOT DETECTED   Staphylococcus aureus (BCID) NOT DETECTED NOT DETECTED   Staphylococcus epidermidis NOT DETECTED NOT DETECTED   Staphylococcus lugdunensis NOT DETECTED NOT DETECTED   Streptococcus species NOT DETECTED NOT DETECTED   Streptococcus agalactiae NOT DETECTED NOT DETECTED   Streptococcus pneumoniae NOT DETECTED NOT DETECTED   Streptococcus pyogenes NOT DETECTED NOT DETECTED   A.calcoaceticus-baumannii NOT DETECTED NOT DETECTED   Bacteroides fragilis NOT DETECTED NOT DETECTED   Enterobacterales DETECTED (A) NOT DETECTED   Enterobacter cloacae complex DETECTED (A) NOT DETECTED   Escherichia coli NOT DETECTED NOT DETECTED   Klebsiella aerogenes NOT DETECTED NOT DETECTED   Klebsiella oxytoca NOT DETECTED NOT DETECTED   Klebsiella pneumoniae NOT DETECTED NOT DETECTED   Proteus species NOT DETECTED NOT DETECTED    Salmonella species NOT DETECTED NOT DETECTED   Serratia marcescens NOT DETECTED NOT DETECTED   Haemophilus influenzae NOT DETECTED NOT DETECTED   Neisseria meningitidis NOT DETECTED NOT DETECTED   Pseudomonas aeruginosa NOT DETECTED NOT DETECTED   Stenotrophomonas maltophilia NOT DETECTED NOT DETECTED   Candida albicans NOT DETECTED NOT DETECTED   Candida auris NOT DETECTED NOT DETECTED   Candida glabrata NOT DETECTED NOT DETECTED   Candida krusei NOT DETECTED NOT DETECTED   Candida parapsilosis NOT DETECTED NOT DETECTED   Candida tropicalis NOT DETECTED NOT DETECTED   Cryptococcus neoformans/gattii NOT DETECTED NOT DETECTED   CTX-M ESBL NOT DETECTED NOT DETECTED   Carbapenem resistance IMP NOT DETECTED NOT DETECTED   Carbapenem resistance KPC NOT DETECTED NOT DETECTED   Carbapenem resistance NDM NOT DETECTED NOT DETECTED   Carbapenem resist OXA 48 LIKE NOT DETECTED NOT DETECTED   Carbapenem resistance VIM NOT DETECTED NOT DETECTED    Thank you for allowing pharmacy to be a part of this patient's care.  Georgina Pillion, PharmD, BCPS, BCIDP Infectious Diseases Clinical Pharmacist 01/17/2023 10:44 AM   **Pharmacist phone directory can now be found on [email or web address] ([email or web address]).  Listed under [facility].

## 2023-01-17 NOTE — Progress Notes (Signed)
Physical Therapy Treatment Patient Details Name: Jocelyn [name] MRN: [id number] DOB: [birth year] Today's Date: 01/17/2023   History of Present Illness Pt is an 89 y.o. female who presented to the ED 12/13 due to LUE weakness, left facial droop and CTH showed development of scattered SAH. PEG replaced 12/16. Recent hospitalization 11/27-12/11 due to R MCA ischemic stroke for which she underwent IR thrombectomy and PEG placement.  She was discharged to [facility] 12/11. PMH: HTN, CHF, a fib with RVR on eliquis, HLD, OSA    PT Comments  Patient fearful of pain when moving and able to communicate intermittently though very limited attention.  She was able to progress balance while seated EOB from max A to CGA with tendency to lean L and posterior.  She verbalized need to toilet though limited tolerance to attempt to stand for placing bed pan.  She was able to reach for and drink a sip of water while on EOB using R UE and wiped her nose as well.  Appropriate for inpatient rehab (<3 hours/day) at d/c.  PT will continue to follow.      If plan is discharge home, recommend the following: Two people to help with walking and/or transfers;Direct supervision/assist for medications management;Assist for transportation;Two people to help with bathing/dressing/bathroom;Assistance with cooking/housework;Assistance with feeding;Direct supervision/assist for financial management;Help with stairs or ramp for entrance;Supervision due to cognitive status   Can travel by private vehicle     No  Equipment Recommendations  None recommended by PT    Recommendations for Other Services       Precautions / Restrictions Precautions Precautions: Fall Precaution Comments: PEG tube, abd binder Restrictions Weight Bearing Restrictions Per Provider Order: No     Mobility  Bed Mobility Overal bed mobility: Needs Assistance Bed Mobility: Rolling, Sidelying to Sit, Sit to Supine Rolling: Max assist, +2 for physical  assistance, +2 for safety/equipment Sidelying to sit: Max assist, +2 for physical assistance, +2 for safety/equipment   Sit to supine: Total assist, +2 for physical assistance, +2 for safety/equipment   General bed mobility comments: cues for moving R leg toward EOB and able to with increased time, though assist for lifting trunk and positioning hips; to supine assist for all aspects as pt on bed pan sitting EOB    Transfers Overall transfer level: Needs assistance Equipment used: None Transfers: Sit to/from Stand Sit to Stand: Total assist           General transfer comment: up to squat for placing BSC pt fearful and indicating pain in knees; OOB deferred due to patient with liquid stool on bed pan while on EOB    Ambulation/Gait                   Stairs             Wheelchair Mobility     Tilt Bed    Modified Rankin (Stroke Patients Only) Modified Rankin (Stroke Patients Only) Pre-Morbid Rankin Score: Severe disability Modified Rankin: Severe disability     Balance Overall balance assessment: Needs assistance Sitting-balance support: Bilateral upper extremity supported, Feet supported Sitting balance-Leahy Scale: Poor Sitting balance - Comments: max A initially, progressed to close CGA                                    Cognition Arousal: Alert Behavior During Therapy: Flat affect Overall Cognitive Status: Impaired/Different from baseline Area of Impairment:  Orientation, Attention, Following commands, Memory, Safety/judgement, Awareness, Problem solving                 Orientation Level: Disoriented to, Situation Current Attention Level: Focused Memory: Decreased recall of precautions, Decreased short-term memory Following Commands: Follows one step commands inconsistently Safety/Judgement: Decreased awareness of safety, Decreased awareness of deficits   Problem Solving: Slow processing, Decreased initiation, Difficulty  sequencing General Comments: pt able to to state name, place and month when given options. She followed ~50% of simple cues with increased time for processing. Very limited attention, needs redirection. Poor initiation for mobility, good initiation to self directed tasks (grooming, drinking, toileting)        Exercises      General Comments General comments (skin integrity, edema, etc.): BP Sitting 145/77. HR 112, SpO2 100% on 2L O2; liquid stool while on Bed pan      Pertinent Vitals/Pain Pain Assessment Pain Assessment: Faces Faces Pain Scale: Hurts little more Pain Location: bilat knees with any ROM Pain Descriptors / Indicators: Grimacing, Discomfort Pain Intervention(s): Monitored during session, Limited activity within patient's tolerance    Home Living Family/patient expects to be discharged to:: Skilled nursing facility                   Additional Comments: Plan is for return to SNF for rehab, [facility]    Prior Function            PT Goals (current goals can now be found in the care plan section) Progress towards PT goals: Progressing toward goals    Frequency    Min 1X/week      PT Plan      Co-evaluation PT/OT/SLP Co-Evaluation/Treatment: Yes Reason for Co-Treatment: Necessary to address cognition/behavior during functional activity;Complexity of the patient's impairments (multi-system involvement);For patient/therapist safety;To address functional/ADL transfers PT goals addressed during session: Mobility/safety with mobility;Balance OT goals addressed during session: ADL's and self-care      AM-PAC PT "6 Clicks" Mobility   Outcome Measure  Help needed turning from your back to your side while in a flat bed without using bedrails?: Total Help needed moving from lying on your back to sitting on the side of a flat bed without using bedrails?: Total Help needed moving to and from a bed to a chair (including a wheelchair)?: Total Help needed standing  up from a chair using your arms (e.g., wheelchair or bedside chair)?: Total Help needed to walk in hospital room?: Total Help needed climbing 3-5 steps with a railing? : Total 6 Click Score: 6    End of Session Equipment Utilized During Treatment: Gait belt;Oxygen Activity Tolerance: Patient limited by pain;Other (comment) (liquid stool) Patient left: in bed;with call bell/phone within reach;with nursing/sitter in room Nurse Communication: Mobility status PT Visit Diagnosis: Other abnormalities of gait and mobility (R26.89);Hemiplegia and hemiparesis;Other symptoms and signs involving the nervous system (R29.898) Hemiplegia - Right/Left: Left Hemiplegia - dominant/non-dominant: Non-dominant Hemiplegia - caused by: Nontraumatic intracerebral hemorrhage     Time: 1011-1046 PT Time Calculation (min) (ACUTE ONLY): 35 min  Charges:    $Therapeutic Activity: 8-22 mins PT General Charges $$ ACUTE PT VISIT: 1 Visit                     Sheran Lawless, PT Acute Rehabilitation Services Office:[phone] 01/17/2023    Jocelyn [name] 01/17/2023, 1:02 PM

## 2023-01-17 NOTE — Progress Notes (Signed)
Speech Language Pathology Treatment: Dysphagia  Patient Details Name: Jocelyn [name] MRN: [id number] DOB: Feb 03, 1931 Today's Date: 01/17/2023 Time: 1555-1610 SLP Time Calculation (min) (ACUTE ONLY): 15 min  Assessment / Plan / Recommendation Clinical Impression  Patient seen by SLP for skilled treatment focused on dysphagia goals. Patient was awake and alert, speech intelligibility improved and SLP able to understand approximately 80% when context known. Patient denies any pain but did endorse a little soreness from her PEG tube site. (Had gotten dislodged and needed replaced) She requested water when asked about her desire for PO's. SLP assisted with holding the cup initially but she was then able to manage with only setup assistance. She drank water via straw sips exhibiting several instances of holding a portion of the bolus in her oral cavity before swallowing. No overt s/s aspiration observed and appears with a primary oral phase dysphagia. Patient politely declined any PO's and then said she was going to sleep. SLP will continue to follow for diet toleration and ability to advance.   Recommendation is to initiate PO diet of: Dys 1(puree) solids, thin liquids.    HPI HPI: 89 yo female presenting back to the hospital (transfer from ED at [facility]) from SNF on 12/14 with acute stroke symptoms and per EMS she had a new onset of left sided facial droop, numbness to left arm and significantly increased weakness of left arm. CTH showed showed interval development of scattered subarachnoid hemorrhage in the right sylvian fissure. Neurosurgery consulted and recommended Keppra 500mg . WBC elevated and trauma was consulted to evaluate PEG tube with CT abdomen pending. She was recently admitted 11/27-12/11 with L sided weakness and R MCA occlusion s/p IR revascularization with complicated arterial cannulation requiring multiple sticks. Intubated 11/28-11/29. PMH includes: GERD, HTN, CHF, AFIB RVR on eliquis,  HLD, OSA. MBSS 01/02/23 was limited due to oral holding, repeat MBS on 12/11 showed moderate oral dysphagia and suspected pharyngoesophageal dysphagia with prominent cricopharyngeaus and recommened PO diet of Dys 1 (puree), thin liquids.      SLP Plan  Continue with current plan of care      Recommendations for follow up therapy are one component of a multi-disciplinary discharge planning process, led by the attending physician.  Recommendations may be updated based on patient status, additional functional criteria and insurance authorization.    Recommendations  Diet recommendations: Dysphagia 1 (puree);Thin liquid Liquids provided via: Cup;Straw Medication Administration: Crushed with puree Supervision: Full supervision/cueing for compensatory strategies;Staff to assist with self feeding;Patient able to self feed Compensations: Slow rate;Small sips/bites Postural Changes and/or Swallow Maneuvers: Seated upright 90 degrees                  Oral care BID;Staff/trained caregiver to provide oral care   Frequent or constant Supervision/Assistance Dysphagia, unspecified (R13.10)     Continue with current plan of care     Angela Nevin, MA, CCC-SLP Speech Therapy

## 2023-01-17 NOTE — Evaluation (Signed)
Occupational Therapy Evaluation Patient Details Name: Jocelyn [name] MRN: [id number] DOB: 08/16/1931 Today's Date: 01/17/2023   History of Present Illness Pt is an 89 y.o. female who presented to the ED 12/13 due to LUE weakness, left facial droop and CTH showed development of scattered SAH. PEG replaced 12/16. Recent hospitalization 11/27-12/11 due to R MCA ischemic stroke for which she underwent IR thrombectomy and PEG placement.  She was discharged to [facility] 12/11. PMH: HTN, CHF, a fib with RVR on eliquis, HLD, OSA   Clinical Impression   Aerica was evaluated s/p the above admission list. She presented from SNF after recent admit and d/c from [facility] with CVA; unsure of functional level at SNF however she was working on stedy transfers while admitted acutely. Upon evaluation the pt was limited by L hemibody weakness, sensory motor deficits, impaired cognition, impaired communication, pain with bilat knee movement, poor balance and limited activity tolerance. Overall she needed max-total A +2 for all aspects of bed mobility and a standing attempt. Due to the deficits listed below the pt also needs min-mod A for all UB ADLs and max-total A for all LB ADLs at bed level. Pt will benefit from continued acute OT services and skilled inpatient follow up therapy, <3 hours/day.         If plan is discharge home, recommend the following: A lot of help with walking and/or transfers;Two people to help with walking and/or transfers;A lot of help with bathing/dressing/bathroom;Two people to help with bathing/dressing/bathroom;Assistance with cooking/housework;Assistance with feeding;Direct supervision/assist for medications management;Direct supervision/assist for financial management;Assist for transportation;Help with stairs or ramp for entrance    Functional Status Assessment  Patient has had a recent decline in their functional status and demonstrates the ability to make significant improvements in function  in a reasonable and predictable amount of time.  Equipment Recommendations  None recommended by OT       Precautions / Restrictions Precautions Precautions: Fall;Other (comment) Precaution Comments: PEG tube, abd binder Restrictions Weight Bearing Restrictions Per Provider Order: No      Mobility Bed Mobility Overal bed mobility: Needs Assistance Bed Mobility: Rolling, Sidelying to Sit, Sit to Supine Rolling: Max assist, +2 for physical assistance, +2 for safety/equipment Sidelying to sit: Max assist, +2 for physical assistance, +2 for safety/equipment   Sit to supine: Total assist, +2 for physical assistance, +2 for safety/equipment        Transfers Overall transfer level: Needs assistance Equipment used: 1 person hand held assist Transfers: Sit to/from Stand Sit to Stand: Total assist           General transfer comment: face to face STS transfer, no initiation. Limited by anticipation of pain?      Balance Overall balance assessment: Needs assistance Sitting-balance support: Bilateral upper extremity supported, Feet supported Sitting balance-Leahy Scale: Poor Sitting balance - Comments: max A initially, progressed to close CGA   Standing balance support: Bilateral upper extremity supported Standing balance-Leahy Scale: Zero                             ADL either performed or assessed with clinical judgement   ADL Overall ADL's : Needs assistance/impaired Eating/Feeding: NPO   Grooming: Minimal assistance   Upper Body Bathing: Moderate assistance   Lower Body Bathing: Maximal assistance;+2 for physical assistance;+2 for safety/equipment;Bed level   Upper Body Dressing : Maximal assistance   Lower Body Dressing: Maximal assistance;+2 for physical assistance;+2 for safety/equipment  Toilet Transfer: Total assistance   Toileting- Clothing Manipulation and Hygiene: Total assistance;+2 for safety/equipment;+2 for physical assistance;Bed level        Functional mobility during ADLs: Maximal assistance;+2 for physical assistance;+2 for safety/equipment General ADL Comments: limited functional participation due to L hemi weakness, L inattention, poor cognition     Vision Baseline Vision/History: 0 No visual deficits Vision Assessment?: Vision impaired- to be further tested in functional context     Perception Perception: Impaired Preception Impairment Details: Inattention/Neglect Perception-Other Comments: L inattention   Praxis Praxis: Impaired       Pertinent Vitals/Pain Pain Assessment Pain Assessment: Faces Faces Pain Scale: Hurts little more Pain Location: bilat knees with any ROM Pain Descriptors / Indicators: Grimacing Pain Intervention(s): Limited activity within patient's tolerance, Monitored during session     Extremity/Trunk Assessment Upper Extremity Assessment Upper Extremity Assessment: RUE deficits/detail;LUE deficits/detail RUE Deficits / Details: overall WFL, generally weak LUE Deficits / Details: no activation noted from wrist to shoulder. Minimal gross flexion noted at digits, denies sensation changes but not responsive to nailbed pressure. L inattention. No functional use. LUE Sensation: decreased light touch;decreased proprioception LUE Coordination: decreased fine motor;decreased gross motor   Lower Extremity Assessment Lower Extremity Assessment: Defer to PT evaluation   Cervical / Trunk Assessment Cervical / Trunk Assessment: Kyphotic   Communication Communication Communication: Difficulty following commands/understanding;Difficulty communicating thoughts/reduced clarity of speech   Cognition Arousal: Alert Behavior During Therapy: Flat affect Overall Cognitive Status: Impaired/Different from baseline Area of Impairment: Orientation, Attention, Following commands, Memory, Safety/judgement, Awareness, Problem solving                 Orientation Level: Disoriented to,  Situation Current Attention Level: Focused Memory: Decreased recall of precautions, Decreased short-term memory Following Commands: Follows one step commands inconsistently Safety/Judgement: Decreased awareness of safety, Decreased awareness of deficits Awareness: Intellectual Problem Solving: Slow processing, Decreased initiation, Difficulty sequencing General Comments: pt able to to state name, place and month when given options. She followed ~50% of simple cues with increased time for processing. Very limited attention, needs redirection. Poor initiation for mobility, good initiation to self directed tasks (grooming, drinking, toileting)     General Comments  BP in sitting 145/77, 100% SpO2 on 2L. Liquid BM.     Home Living Family/patient expects to be discharged to:: Skilled nursing facility           Additional Comments: Plan is for return to SNF for rehab, [facility]      Prior Functioning/Environment                 ADLs Comments: unknown at this time        OT Problem List: Decreased strength;Decreased activity tolerance;Impaired balance (sitting and/or standing);Decreased range of motion;Impaired vision/perception;Decreased coordination;Decreased cognition;Decreased safety awareness;Decreased knowledge of use of DME or AE;Decreased knowledge of precautions;Cardiopulmonary status limiting activity;Impaired sensation;Impaired tone;Impaired UE functional use;Obesity      OT Treatment/Interventions: Self-care/ADL training;Therapeutic exercise;Energy conservation;DME and/or AE instruction;Manual therapy;Modalities;Splinting;Therapeutic activities;Cognitive remediation/compensation;Visual/perceptual remediation/compensation;Patient/family education;Balance training;Neuromuscular education    OT Goals(Current goals can be found in the care plan section) Acute Rehab OT Goals Patient Stated Goal: to have BM OT Goal Formulation: Patient unable to participate in goal setting Time  For Goal Achievement: 01/31/23 Potential to Achieve Goals: Fair ADL Goals Pt Will Perform Grooming: with set-up;sitting Pt Will Perform Upper Body Dressing: with min assist;sitting Pt Will Perform Lower Body Dressing: with max assist;sit to/from stand Pt Will Transfer to Toilet: with max assist;stand pivot transfer;bedside commode  Additional ADL Goal #1: Pt will follow simple 1 step command 80% of attempts as a precursor to ADLs  OT Frequency: Min 1X/week    Co-evaluation PT/OT/SLP Co-Evaluation/Treatment: Yes Reason for Co-Treatment: Necessary to address cognition/behavior during functional activity;Complexity of the patient's impairments (multi-system involvement);For patient/therapist safety;To address functional/ADL transfers   OT goals addressed during session: ADL's and self-care      AM-PAC OT "6 Clicks" Daily Activity     Outcome Measure Help from another person eating meals?: Total Help from another person taking care of personal grooming?: A Lot Help from another person toileting, which includes using toliet, bedpan, or urinal?: Total Help from another person bathing (including washing, rinsing, drying)?: A Lot Help from another person to put on and taking off regular upper body clothing?: A Lot Help from another person to put on and taking off regular lower body clothing?: A Lot 6 Click Score: 10   End of Session Equipment Utilized During Treatment: Gait belt Nurse Communication: Mobility status  Activity Tolerance: Patient limited by pain Patient left: in bed;with call bell/phone within reach;with bed alarm set  OT Visit Diagnosis: Unsteadiness on feet (R26.81);Muscle weakness (generalized) (M62.81)                Time: 7829-5621 OT Time Calculation (min): 35 min Charges:  OT General Charges $OT Visit: 1 Visit OT Evaluation $OT Eval Moderate Complexity: 1 Mod  Derenda Mis, OTR/L Acute Rehabilitation Services Office [phone] Secure Chat Communication  Preferred   Donia Pounds 01/17/2023, 11:09 AM

## 2023-01-17 NOTE — Progress Notes (Addendum)
STROKE TEAM PROGRESS NOTE   BRIEF HPI [name] is a 89 y.o. female with history of Arthritis of both knees, Colitis, Essential hypertension, GERD, iron deficiency anemia, Hyperlipidemia, Lumbar pain, OSA on CPAP (08/01/2015), Overweight, Prediabetes, and SOB., atrial fibrillation (on Eliquis) who was recently discharged from the Stroke service at [facility] on 12/11 after management of an acute right MCA territory infarct in the setting of atrial fibrillation. She had presented at that time with left-sided weakness and confusion. Imaging had revealed a right A1 and M2 occlusion and she underwent successful mechanical thrombectomy, with TICI 3 revascularization. which was successful. Her Eliquis was restarted on 12/10 for her chronic A-fib. She was initially unable to pass her swallow study, and PEG tube was inserted on 12/9 with tube feeding started, although patient did pass swallow study afterwards and was beginning to take foods by mouth at the time of her discharge. She did have some pain in her left knee, which was successfully treated with lidocaine patches. She was discharged to a SNF.   She re-presented to the ED at [facility] on Friday from [facility] rehab facility for acute stroke symptoms. LKN was 1830. She had been at [facility] for LUE weakness. Per EMS she had new onset L side facial droop, numbness to L arm, and significantly increased weakness to L arm. CTH showed interval development of scattered subarachnoid hemorrhage in the right sylvian fissure. Teleneurology was consulted. NIHSS was 17. Teleneurology advised DOAC reversal with KCentra, BP management, EEG for observed facial twitching and admission as well as Neurosurgery consult. Neurosurgery recommended Keppra 500 mg. CTA showed no LVO; atheromatous plaque was again noted. 3-4 mm outpouchings extending posteriorly and inferiorly from both supraclinoid ICAs were noted, which Radiology felt could reflect small aneurysms versus vascular  infundibula; the outpouchings were similar in appearance compared to recent MRA from 12/30/2022.  NIH on Admission 17  SIGNIFICANT HOSPITAL EVENTS 12/11- Discharged to rehab 12/14- Admitted with [facility] 12/15-dislodged PEG tube, plan to replace and IR on 12/16 12/16-transferred out of the ICU, incision and drainage of left abdominal wall infection. 12/17-blood cultures positive for Enterobacter cloacae, cefepime started  INTERIM HISTORY/SUBJECTIVE Patient has been hemodynamically stable with a Tmax of 99.6.  1 out of 3 blood cultures was positive for Enterobacter cloacae, and antibiotics were adjusted to cefepime for better coverage.  Likely source is left abdominal wall infection which was debrided yesterday by Dr. Janee Morn neurological exam is unchanged with persistent left hemiparesis.   OBJECTIVE  CBC    Component Value Date/Time   WBC 15.4 (H) 01/17/2023 0439   RBC 3.20 (L) 01/17/2023 0439   HGB 8.8 (L) 01/17/2023 0439   HGB 12.5 09/19/2022 0916   HCT 26.3 (L) 01/17/2023 0439   HCT 39.6 09/19/2022 0916   PLT 389 01/17/2023 0439   PLT 169 09/19/2022 0916   MCV 82.2 01/17/2023 0439   MCV 87 09/19/2022 0916   MCV 82 04/13/2011 1125   MCH 27.5 01/17/2023 0439   MCHC 33.5 01/17/2023 0439   RDW 14.6 01/17/2023 0439   RDW 12.9 09/19/2022 0916   RDW 14.4 04/13/2011 1125   LYMPHSABS 1.3 01/13/2023 2020   LYMPHSABS 3.7 (H) 07/08/2015 1136   MONOABS 0.7 01/13/2023 2020   EOSABS 0.1 01/13/2023 2020   EOSABS 0.2 07/08/2015 1136   BASOSABS 0.0 01/13/2023 2020   BASOSABS 0.1 07/08/2015 1136    BMET    Component Value Date/Time   NA 136 01/17/2023 0439   NA 143 12/07/2022  1535   NA 141 04/13/2011 1125   K 4.1 01/17/2023 0439   K 3.4 (L) 04/13/2011 1125   CL 110 01/17/2023 0439   CL 105 04/13/2011 1125   CO2 19 (L) 01/17/2023 0439   CO2 29 04/13/2011 1125   GLUCOSE 141 (H) 01/17/2023 0439   GLUCOSE 111 (H) 04/13/2011 1125   BUN 36 (H) 01/17/2023 0439   BUN 16 12/07/2022  1535   BUN 14 04/13/2011 1125   CREATININE 0.63 01/17/2023 0439   CREATININE 0.80 08/01/2018 1544   CALCIUM 8.3 (L) 01/17/2023 0439   CALCIUM 9.2 04/13/2011 1125   EGFR 74 12/07/2022 1535   GFRNONAA >60 01/17/2023 0439   GFRNONAA 67 08/01/2018 1544    IMAGING past 24 hours IR REPLACE GASTRO/JEJUNO TUBE PERC W/FLUORO Result Date: 01/16/2023 INDICATION: 89 year old female with surgically placed G-tube which was inadvertently displaced. Currently, a 10 French Foley catheter is maintaining the tract. She presents for gastric tube replacement. EXAM: Fluoroscopically guided placement of percutaneous pull-through gastrostomy tube Interventional Radiologist:  Sterling Big, MD MEDICATIONS: None. ANESTHESIA/SEDATION: None. CONTRAST:  15mL OMNIPAQUE IOHEXOL 300 MG/ML  SOLN FLUOROSCOPY: Radiation exposure index: 5 mGy reference air kerma COMPLICATIONS: None immediate. PROCEDURE: Informed written consent was obtained from the patient after a thorough discussion of the procedural risks, benefits and alternatives. All questions were addressed. Maximal Sterile Barrier Technique was utilized including caps, mask, sterile gowns, sterile gloves, sterile drape, hand hygiene and skin antiseptic. A timeout was performed prior to the initiation of the procedure. Maximal barrier sterile technique utilized including caps, mask, sterile gowns, sterile gloves, large sterile drape, hand hygiene, and chlorhexadine skin prep. A gentle hand injection of contrast material through the Foley catheter was performed confirming the catheter is indeed within the gastric lumen. The catheter was removed over a wire. An Amplatz wire was advanced into the gastric lumen. The percutaneous tract was dilated to 20 [place]. A 20 French balloon retention gastrostomy tube was advanced over the wire and into the stomach. The retention balloon was filled with 15 mL saline and pulled snug against the anterior abdominal wall. The external bumper  was fixed in place. Contrast injection was again performed confirming the tube is within the stomach. The tube was then flushed with saline. IMPRESSION: Successful exchange for a 20 French balloon retention gastrostomy tube. Electronically Signed   By: Malachy Moan M.D.   On: 01/16/2023 18:31    Vitals:   01/17/23 0700 01/17/23 0800 01/17/23 0900 01/17/23 1200  BP: (!) 126/56 (!) 123/58 (!) 131/59   Pulse: 84 89 90   Resp: (!) 30 (!) 23 (!) 35   Temp:  98.3 F (36.8 C)  99.6 F (37.6 C)  TempSrc:  Oral  Axillary  SpO2: 100% 100% 98%   Weight:         PHYSICAL EXAM General:  Alert, well-nourished, well-developed elderly patient in no acute distress Psych:  Mood and affect appropriate for situation CV: Regular rate and rhythm on monitor Respiratory:  Regular, unlabored respirations on room air GI: Abdomen soft and nontender  Neurological Examination Mental Status: Awakens to voice. Able to give her name but unable to correctly answer  orientation questions. Able to protrude tongue and weakly squeeze examiner's hand to command. Severe dysarthria.  Usually responds with one-word answers but can sometimes make a short sentence Cranial Nerves: II:  PERRL   III,IV, VI: No ptosis.Eyes are conjugate.  V: Reacts to touch bilaterally  VII: Smiles symmetrically to command  VIII:  Hearing intact to voice IX,X: Gag reflex deferred XI: Unable to assess due to poor comprehension of commands XII: Midline tongue extension Motor/Sensory: RUE: Weakly grips examiner's hand.  Able to raise arm off the bed LUE: Weakly moves left upper extremity to command BLE: weakly moves bilateral lower extremities to command  Cerebellar: Unable to assess Gait: Unable to assess    ASSESSMENT/PLAN  Subarachnoid Hemorrhage :  scattered SAH in right sylvian fissure Etiology:  eliquis   Code Stroke CT head - Interval development of scattered small volume subarachnoid hemorrhage within the right Sylvian  fissure. No significant mass effect. Upon review of recent MRA there is question of a 3-4 mm outpouching at the takeoff of the right PCOM, which could potentially reflect a small aneurysm versus infundibulum, not appreciated on initial interpretation. This finding is not certain, as no aneurysm is described at this location on recent arteriogram. Evolving right MCA distribution infarcts, similar to prior. Aspects is 7. CTA head & neck -  3-4 mm outpouchings extending posteriorly and inferiorly from both supraclinoid ICAs, which could reflect small aneurysms versus vascular infundibula. Appearance is similar as compared to recent MRA from 12/30/2022. Moderate atheromatous plaque about the carotid bifurcations and carotid siphons without hemodynamically significant stenosis. Atheromatous plaque at the origin of the right vertebral artery with moderate to severe stenosis. LDL 33 HgbA1c 6.4 EEG- This study is suggestive of cortical dysfunction arising from right hemisphere likely secondary to underlying structural abnormality. Additionally there is moderate diffuse encephalopathy. No seizures were seen throughout the recording. Generalized sharp transients were noted. Recommend long term monitoring for further evaluation Long-term EEG 12/15: Continuous slow, generalized and lateralized right hemisphere, suggestive of cortical dysfunction arising from right hemisphere and moderate diffuse encephalopathy, no seizures seen VTE prophylaxis - SCDs Eliquis (apixaban) daily prior to admission, now on No antithrombotic  Therapy recommendations: SNF Disposition: Transferred out of ICU, awaiting bed on 3 W.  Hx of Stroke/TIA 11/28-12/11- Acute Ischemic Infarct:  right MCA territory infarct with R M2 and A1 occlusion s/p IR with TICI 3 revascularization, etiology: Cardioembolic in the setting of A-fib  Code Stroke  CT head  -hyperdense right MCA, cytotoxic edema in right stratum and insula.  ASPECTS 7   CTA head &  neck acute clot at the right terminal ICA and acute obstructing right MCA M2 and ACA A1. CT perfusion 6/149 in the right MCA territory.  S/p IR via right ICA direct access with TICI3 MRI scattered acute right MCA distribution infarcts involving the right caudate and lentiform nuclei, right insula, and overlying right cerebral hemisphere, small volume petechial hemorrhage at the posterior right frontal region MRA  with interval revascularization of previously seen clot at the right ICA terminus and right M2 segment, Focal moderate distal right P2 stenosis. 2D Echo EF 60 to 65%.  LV severe concentric hypertrophy.  LA severely dilated LDL 33 HgbA1c 6.4    Abnormal EEG LTM connected Routine EEG 12/14 This study is suggestive of cortical dysfunction arising from right hemisphere likely secondary to underlying structural abnormality. Additionally there is moderate diffuse encephalopathy. No seizures were seen throughout the recording. Generalized sharp transients were noted. Recommend long term monitoring for further evaluation  Atrial fibrillation Home Meds: Eliquis Reversed with Kcentra at [facility] On tele Rate controlled On hold due to [facility]   Cerebral aneurysm now with [facility] CTA- -4 mm outpouchings extending posteriorly and inferiorly from both supraclinoid ICAs, which could reflect small aneurysms versus vascular infundibula. Appearance is similar as compared  to recent MRA from 12/30/2022. Moderate atheromatous plaque about the carotid bifurcations and carotid siphons without hemodynamically significant stenosis. Atheromatous plaque at the origin of the right vertebral artery with moderate to severe stenosis. Nimodipine 60mg  q4hr  Yearly MRA follow up as outpt   Hypertensive Emergency Home meds: Imdur 120 mg, olmesartan-amlodipine-HCTZ 40-10-12.5 mg, spironolactone 25 mg Amlodipine 5mg , nimodipine 60mg  q4hr BP goal: SBP 130-150 Long-term BP goal normotensive   Hyperlipidemia Home meds:  Lipitor 40 mg, resumed in hospital LDL 33, goal < 70 Statin discontinued 12/17 due to elevated LFTs, will resume when LFTs return to baseline   Dysphagia Patient has post-stroke dysphagia, SLP consulted Continue NPO PEG Tube 12/9 Resume TF- Osmolite 1.2@55ml /hr- Initiate at 47ml/hr and increase by 42ml/hr q 8 hours until goal rate is each.  Free q6  Dietitian consulted Trace purulent drainage, tenderness around PEG- trauma re engaged CT Abd pending  PEG tube dislodged 12/15, Foley catheter placed in tract, Foley exchanged for PEG tube in IR on 12/16   Other Stroke Risk Factors  Obstructive sleep apnea,  on CPAP at home  Advanced age   Other acute issues Left knee pain - lidocaine patch - arthritis in bilateral knees- f/u outpatient  10 beat run of V. tach on 12/16-spontaneously terminated, will recheck BMP and magnesium Left abdominal wall infection-I&D performed 12/16  Other Active Problems Leukocytosis WBC 17.5 -> 15.0 -> 19.2-> 15.4 Tmax 99.6 Blood cultures 1 out of 3 positive for Enterobacter cloaca  Begin cefepime 12/17 CXR, UA ordered  Trend Completed 7 days of ceftriaxone during last hospitalization due to aspiration pna Transaminitis 176/286 -> 123/208-> 427/354 Trend, statin stopped 12/17  Hospital day # 3   Patient seen and examined by NP/APP with MD. MD to update note as needed.   Cortney E Ernestina Columbia , MSN, AGACNP-BC [facility] See Amion for schedule and pager information 01/17/2023 12:52 PM  I have personally obtained history,examined this patient, reviewed notes, independently viewed imaging studies, participated in medical decision making and plan of care.ROS completed by me personally and pertinent positives fully documented  I have made any additions or clarifications directly to the above note. Agree with note above.  Patient neurological exam is stable but she has persistent left hemiparesis.  Blood cultures have grown Enterobacter  cloacae and will start cefepime for likely abdominal wall infection.  She had PEG tube changed yesterday.  Continue close neurological observation and strict blood pressure control.  Patient remains at risk for clots and embolization and strokes with is not an anticoagulation candidate due to recent brain hemorrhage.  Transfer to neurological floor bed when available.  Greater than 50% time during this 50-minute visit was spent on counseling and coordination of care about her intracerebral hemorrhage and atrial fibrillation and risk-benefit of anticoagulation stroke prevention and answering questions  Delia Heady, MD Medical Director [facility] Pager: [phone] 01/17/2023 3:04 PM  To contact Stroke Continuity provider, please refer to [email or web address]. After hours, contact General Neurology

## 2023-01-18 ENCOUNTER — Ambulatory Visit: Payer: 59 | Admitting: Student in an Organized Health Care Education/Training Program

## 2023-01-18 ENCOUNTER — Inpatient Hospital Stay (HOSPITAL_COMMUNITY): Payer: 59

## 2023-01-18 LAB — CBC
HCT: 27.4 % — ABNORMAL LOW (ref 36.0–46.0)
Hemoglobin: 9.1 g/dL — ABNORMAL LOW (ref 12.0–15.0)
MCH: 27.3 pg (ref 26.0–34.0)
MCHC: 33.2 g/dL (ref 30.0–36.0)
MCV: 82.3 fL (ref 80.0–100.0)
Platelets: 347 10*3/uL (ref 150–400)
RBC: 3.33 MIL/uL — ABNORMAL LOW (ref 3.87–5.11)
RDW: 14.7 % (ref 11.5–15.5)
WBC: 13.7 10*3/uL — ABNORMAL HIGH (ref 4.0–10.5)
nRBC: 0.2 % (ref 0.0–0.2)

## 2023-01-18 LAB — COMPREHENSIVE METABOLIC PANEL
ALT: 375 U/L — ABNORMAL HIGH (ref 0–44)
AST: 332 U/L — ABNORMAL HIGH (ref 15–41)
Albumin: 1.9 g/dL — ABNORMAL LOW (ref 3.5–5.0)
Alkaline Phosphatase: 111 U/L (ref 38–126)
Anion gap: 6 (ref 5–15)
BUN: 31 mg/dL — ABNORMAL HIGH (ref 8–23)
CO2: 19 mmol/L — ABNORMAL LOW (ref 22–32)
Calcium: 8.2 mg/dL — ABNORMAL LOW (ref 8.9–10.3)
Chloride: 111 mmol/L (ref 98–111)
Creatinine, Ser: 0.79 mg/dL (ref 0.44–1.00)
GFR, Estimated: >60 mL/min (ref >60)
Glucose, Bld: 155 mg/dL — ABNORMAL HIGH (ref 70–99)
Potassium: 4.8 mmol/L (ref 3.5–5.1)
Sodium: 136 mmol/L (ref 135–145)
Total Bilirubin: 0.6 mg/dL (ref <1.2)
Total Protein: 6.5 g/dL (ref 6.5–8.1)

## 2023-01-18 LAB — GLUCOSE, CAPILLARY
Glucose-Capillary: 192 mg/dL — ABNORMAL HIGH (ref 70–99)
Glucose-Capillary: 163 mg/dL — ABNORMAL HIGH (ref 70–99)
Glucose-Capillary: 171 mg/dL — ABNORMAL HIGH (ref 70–99)
Glucose-Capillary: 171 mg/dL — ABNORMAL HIGH (ref 70–99)
Glucose-Capillary: 149 mg/dL — ABNORMAL HIGH (ref 70–99)
Glucose-Capillary: 148 mg/dL — ABNORMAL HIGH (ref 70–99)

## 2023-01-18 LAB — AEROBIC CULTURE W GRAM STAIN (SUPERFICIAL SPECIMEN)

## 2023-01-18 LAB — MAGNESIUM: Magnesium: 2.2 mg/dL (ref 1.7–2.4)

## 2023-01-18 MED ORDER — DIATRIZOATE MEGLUMINE & SODIUM 66-10 % PO SOLN
30.0000 mL | Freq: Once | ORAL | Status: AC
  Administered 2023-01-18: 30 mL
  Filled 2023-01-18: qty 30

## 2023-01-18 MED ORDER — FAMOTIDINE 20 MG PO TABS
20.0000 mg | ORAL_TABLET | Freq: Two times a day (BID) | ORAL | Status: DC
  Administered 2023-01-18 – 2023-01-25 (×15): 20 mg via ORAL
  Filled 2023-01-18 (×15): qty 1

## 2023-01-18 MED ORDER — DIATRIZOATE MEGLUMINE & SODIUM 66-10 % PO SOLN
ORAL | Status: AC
  Filled 2023-01-18: qty 30

## 2023-01-18 MED ORDER — FERROUS SULFATE 75 (15 FE) MG/ML PO SOLN
65.0000 mg | Freq: Every day | ORAL | Status: DC
  Administered 2023-01-19 – 2023-01-21 (×3): 65 mg via ORAL
  Filled 2023-01-18 (×4): qty 4.3

## 2023-01-18 MED ORDER — OYSTER SHELL CALCIUM/D3 500-5 MG-MCG PO TABS
1.0000 | ORAL_TABLET | Freq: Two times a day (BID) | ORAL | Status: DC
  Administered 2023-01-18 – 2023-01-25 (×15): 1 via ORAL
  Filled 2023-01-18 (×15): qty 1

## 2023-01-18 MED ORDER — HYDROCODONE-ACETAMINOPHEN 5-325 MG PO TABS
1.0000 | ORAL_TABLET | Freq: Four times a day (QID) | ORAL | Status: DC | PRN
  Administered 2023-01-18 – 2023-01-24 (×7): 1 via ORAL
  Filled 2023-01-18 (×7): qty 1

## 2023-01-18 MED ORDER — FAMOTIDINE 20 MG PO TABS: 20.0000 mg | ORAL_TABLET | Freq: Two times a day (BID) | ORAL | Status: DC

## 2023-01-18 MED ORDER — SENNOSIDES-DOCUSATE SODIUM 8.6-50 MG PO TABS
1.0000 | ORAL_TABLET | Freq: Two times a day (BID) | ORAL | Status: DC
  Administered 2023-01-18 – 2023-01-19 (×3): 1 via ORAL
  Filled 2023-01-18 (×3): qty 1

## 2023-01-18 NOTE — Progress Notes (Signed)
Patient ID: Jocelyn [name], female   DOB: 1931/03/24, 89 y.o.   MRN: [id number] [facility] Surgery Progress Note     Subjective: CC-  No complaints. TF running at 55cc/hr.  WBC down 13.7, afebrile.  Objective: Vital signs in last 24 hours: Temp:  [98.3 F (36.8 C)-99.9 F (37.7 C)] 98.9 F (37.2 C) (12/18 0823) Pulse Rate:  [82-104] 102 (12/18 0823) Resp:  [9-33] 19 (12/18 0823) BP: (121-155)/(50-72) 137/59 (12/18 0823) SpO2:  [97 %-100 %] 99 % (12/18 0823) Weight:  [65.6 kg] 65.6 kg (12/18 0500) Last BM Date : 01/17/23  Intake/Output from previous day: 12/17 0701 - 12/18 0700 In: 1899.9 [NG/GT:1600; IV Piggyback:299.9] Out: 300 [Urine:300] Intake/Output this shift: No intake/output data recorded.  PE: Gen:  Alert, NAD, pleasant Abd: soft, NT/ND, +BS, PEG site with some tan drainage on dressing, LLQ I&D site with penrose drain present and scant bloody fluid on dressing, no fluctuance, induration tracks from I&D site to PEG site  Lab Results:  Recent Labs    01/17/23 0439 01/18/23 0554  WBC 15.4* 13.7*  HGB 8.8* 9.1*  HCT 26.3* 27.4*  PLT 389 347   BMET Recent Labs    01/17/23 0439 01/18/23 0554  NA 136 136  K 4.1 4.8  CL 110 111  CO2 19* 19*  GLUCOSE 141* 155*  BUN 36* 31*  CREATININE 0.63 0.79  CALCIUM 8.3* 8.2*   PT/INR No results for input(s): "LABPROT", "INR" in the last 72 hours. CMP     Component Value Date/Time   NA 136 01/18/2023 0554   NA 143 12/07/2022 1535   NA 141 04/13/2011 1125   K 4.8 01/18/2023 0554   K 3.4 (L) 04/13/2011 1125   CL 111 01/18/2023 0554   CL 105 04/13/2011 1125   CO2 19 (L) 01/18/2023 0554   CO2 29 04/13/2011 1125   GLUCOSE 155 (H) 01/18/2023 0554   GLUCOSE 111 (H) 04/13/2011 1125   BUN 31 (H) 01/18/2023 0554   BUN 16 12/07/2022 1535   BUN 14 04/13/2011 1125   CREATININE 0.79 01/18/2023 0554   CREATININE 0.80 08/01/2018 1544   CALCIUM 8.2 (L) 01/18/2023 0554   CALCIUM 9.2 04/13/2011 1125   PROT 6.5  01/18/2023 0554   PROT 7.6 12/07/2022 1535   PROT 7.8 04/13/2011 1125   ALBUMIN 1.9 (L) 01/18/2023 0554   ALBUMIN 4.4 12/07/2022 1535   ALBUMIN 4.0 04/13/2011 1125   AST 332 (H) 01/18/2023 0554   AST 24 04/13/2011 1125   ALT 375 (H) 01/18/2023 0554   ALT 20 04/13/2011 1125   ALKPHOS 111 01/18/2023 0554   ALKPHOS 51 04/13/2011 1125   BILITOT 0.6 01/18/2023 0554   BILITOT 0.8 12/07/2022 1535   BILITOT 0.6 04/13/2011 1125   GFRNONAA >60 01/18/2023 0554   GFRNONAA 67 08/01/2018 1544   GFRAA 78 08/01/2018 1544   Lipase  No results found for: "LIPASE"     Studies/Results: IR REPLACE GASTRO/JEJUNO TUBE PERC W/FLUORO Result Date: 01/16/2023 INDICATION: 89 year old female with surgically placed G-tube which was inadvertently displaced. Currently, a 10 French Foley catheter is maintaining the tract. She presents for gastric tube replacement. EXAM: Fluoroscopically guided placement of percutaneous pull-through gastrostomy tube Interventional Radiologist:  Sterling Big, MD MEDICATIONS: None. ANESTHESIA/SEDATION: None. CONTRAST:  15mL OMNIPAQUE IOHEXOL 300 MG/ML  SOLN FLUOROSCOPY: Radiation exposure index: 5 mGy reference air kerma COMPLICATIONS: None immediate. PROCEDURE: Informed written consent was obtained from the patient after a thorough discussion of the procedural risks, benefits and  alternatives. All questions were addressed. Maximal Sterile Barrier Technique was utilized including caps, mask, sterile gowns, sterile gloves, sterile drape, hand hygiene and skin antiseptic. A timeout was performed prior to the initiation of the procedure. Maximal barrier sterile technique utilized including caps, mask, sterile gowns, sterile gloves, large sterile drape, hand hygiene, and chlorhexadine skin prep. A gentle hand injection of contrast material through the Foley catheter was performed confirming the catheter is indeed within the gastric lumen. The catheter was removed over a wire. An Amplatz  wire was advanced into the gastric lumen. The percutaneous tract was dilated to 20 [place]. A 20 French balloon retention gastrostomy tube was advanced over the wire and into the stomach. The retention balloon was filled with 15 mL saline and pulled snug against the anterior abdominal wall. The external bumper was fixed in place. Contrast injection was again performed confirming the tube is within the stomach. The tube was then flushed with saline. IMPRESSION: Successful exchange for a 20 French balloon retention gastrostomy tube. Electronically Signed   By: Malachy Moan M.D.   On: 01/16/2023 18:31    Anti-infectives: Anti-infectives (From admission, onward)    Start     Dose/Rate Route Frequency Ordered Stop   01/17/23 1130  ceFEPIme (MAXIPIME) 2 g in sodium chloride 0.9 % 100 mL IVPB        2 g 200 mL/hr over 30 Minutes Intravenous Every 12 hours 01/17/23 1038     01/16/23 0945  cefTRIAXone (ROCEPHIN) 2 g in sodium chloride 0.9 % 100 mL IVPB  Status:  Discontinued        2 g 200 mL/hr over 30 Minutes Intravenous Every 24 hours 01/16/23 0931 01/17/23 1038        Assessment/Plan Abdominal wall abscess -S/p PEG placement 12/9 Dr. Bedelia Person. PEG dislodged, tract maintained, able to get a foley into the tract. Exchanged for IR PEG 12/16.   -S/p bedside I&D of abdominal wall with penrose drain placement 12/16 by Dr. Janee Morn. - Tolerating tube feedings at goal. She does have some ongoing small amount of tan drainage around PEG, pus vs tube feedings. Will check film to ensure PEG is in proper position and not leaking. This may just be ongoing purulent drainage. WBC is down trending, afebrile. Continue penrose. Continue antibiotics.  FEN - D1 diet, tube feeds at goal VTE - SCD's, sq heparin, DOAC held in the setting of ICH ID - Enterobacter Bacteremia, cefepime 12/17>>    Transaminitis - alk phos and bilirubin are WNL; gallbladder is surgically absent  R SAH  Cerebral aneurysm  HTN  emergency Hx ischemic R MCA stroke  A.fib  HLD ODA Osteoarthritis   I reviewed last 24 h vitals and pain scores, last 48 h intake and output, and last 24 h labs and trends.    LOS: 4 days    [name], [facility] Surgery 01/18/2023, 10:00 AM Please see Amion for pager number during day hours 7:00am-4:30pm

## 2023-01-18 NOTE — Progress Notes (Addendum)
STROKE TEAM PROGRESS NOTE   BRIEF HPI Jocelyn [name] is a 89 y.o. female with history of Arthritis of both knees, Colitis, Essential hypertension, GERD, iron deficiency anemia, Hyperlipidemia, Lumbar pain, OSA on CPAP (08/01/2015), Overweight, Prediabetes, and SOB., atrial fibrillation (on Eliquis) who was recently discharged from the Stroke service at [facility] on 12/11 after management of an acute right MCA territory infarct in the setting of atrial fibrillation. She had presented at that time with left-sided weakness and confusion. Imaging had revealed a right A1 and M2 occlusion and she underwent successful mechanical thrombectomy, with TICI 3 revascularization. which was successful. Her Eliquis was restarted on 12/10 for her chronic A-fib. She was initially unable to pass her swallow study, and PEG tube was inserted on 12/9 with tube feeding started, although patient did pass swallow study afterwards and was beginning to take foods by mouth at the time of her discharge. She did have some pain in her left knee, which was successfully treated with lidocaine patches. She was discharged to a SNF.   She re-presented to the ED at [facility] on Friday from [facility] rehab facility for acute stroke symptoms. LKN was 1830. She had been at [facility] for LUE weakness. Per EMS she had new onset L side facial droop, numbness to L arm, and significantly increased weakness to L arm. CTH showed interval development of scattered subarachnoid hemorrhage in the right sylvian fissure. Teleneurology was consulted. NIHSS was 17. Teleneurology advised DOAC reversal with KCentra, BP management, EEG for observed facial twitching and admission as well as Neurosurgery consult. Neurosurgery recommended Keppra 500 mg. CTA showed no LVO; atheromatous plaque was again noted. 3-4 mm outpouchings extending posteriorly and inferiorly from both supraclinoid ICAs were noted, which Radiology felt could reflect small aneurysms versus vascular  infundibula; the outpouchings were similar in appearance compared to recent MRA from 12/30/2022.  NIH on Admission 17  SIGNIFICANT HOSPITAL EVENTS 12/11- Discharged to rehab 12/14- Admitted with [facility] 12/15-dislodged PEG tube, plan to replace and IR on 12/16 12/16-transferred out of the ICU, incision and drainage of left abdominal wall infection. 12/17-blood cultures positive for Enterobacter cloacae, cefepime started  INTERIM HISTORY/SUBJECTIVE Patient has been hemodynamically stable.  Neurological exam is unchanged with persistent left hemiparesis. Tmax 99.9. Remains on abx. Trauma following due to abdominal abscess, they would like to watch her inpatient for a couple of days to ensure infection is clearing    OBJECTIVE  CBC    Component Value Date/Time   WBC 13.7 (H) 01/18/2023 0554   RBC 3.33 (L) 01/18/2023 0554   HGB 9.1 (L) 01/18/2023 0554   HGB 12.5 09/19/2022 0916   HCT 27.4 (L) 01/18/2023 0554   HCT 39.6 09/19/2022 0916   PLT 347 01/18/2023 0554   PLT 169 09/19/2022 0916   MCV 82.3 01/18/2023 0554   MCV 87 09/19/2022 0916   MCV 82 04/13/2011 1125   MCH 27.3 01/18/2023 0554   MCHC 33.2 01/18/2023 0554   RDW 14.7 01/18/2023 0554   RDW 12.9 09/19/2022 0916   RDW 14.4 04/13/2011 1125   LYMPHSABS 1.3 01/13/2023 2020   LYMPHSABS 3.7 (H) 07/08/2015 1136   MONOABS 0.7 01/13/2023 2020   EOSABS 0.1 01/13/2023 2020   EOSABS 0.2 07/08/2015 1136   BASOSABS 0.0 01/13/2023 2020   BASOSABS 0.1 07/08/2015 1136    BMET    Component Value Date/Time   NA 136 01/18/2023 0554   NA 143 12/07/2022 1535   NA 141 04/13/2011 1125   K 4.8  01/18/2023 0554   K 3.4 (L) 04/13/2011 1125   CL 111 01/18/2023 0554   CL 105 04/13/2011 1125   CO2 19 (L) 01/18/2023 0554   CO2 29 04/13/2011 1125   GLUCOSE 155 (H) 01/18/2023 0554   GLUCOSE 111 (H) 04/13/2011 1125   BUN 31 (H) 01/18/2023 0554   BUN 16 12/07/2022 1535   BUN 14 04/13/2011 1125   CREATININE 0.79 01/18/2023 0554   CREATININE  0.80 08/01/2018 1544   CALCIUM 8.2 (L) 01/18/2023 0554   CALCIUM 9.2 04/13/2011 1125   EGFR 74 12/07/2022 1535   GFRNONAA >60 01/18/2023 0554   GFRNONAA 67 08/01/2018 1544    IMAGING past 24 hours No results found.   Vitals:   01/17/23 2242 01/18/23 0335 01/18/23 0500 01/18/23 0823  BP: 128/66 (!) 147/58  (!) 137/59  Pulse:  (!) 104  (!) 102  Resp: 18 18  19   Temp: 99.9 F (37.7 C) 99.1 F (37.3 C)  98.9 F (37.2 C)  TempSrc: Oral Oral  Oral  SpO2: 98% 100%  99%  Weight:   65.6 kg      PHYSICAL EXAM General:  Alert, well-nourished, well-developed elderly patient in no acute distress Psych:  Mood and affect appropriate for situation CV: Regular rate and rhythm on monitor Respiratory:  Regular, unlabored respirations on room air GI: Abdomen soft and nontender  Neurological Examination Mental Status: Awakens to voice. Able to give her name but unable to correctly answer  orientation questions. Able to protrude tongue and weakly squeeze examiner's hand to command. Severe dysarthria.  Usually responds with one-word answers but can sometimes make a short sentence Cranial Nerves: II:  PERRL   III,IV, VI: No ptosis.Eyes are conjugate.  V: Reacts to touch bilaterally  VII: Smiles symmetrically to command  VIII: Hearing intact to voice IX,X: Gag reflex deferred XI: Unable to assess due to poor comprehension of commands XII: Midline tongue extension Motor/Sensory: RUE: Weakly grips examiner's hand.  Able to raise arm off the bed LUE: Weakly moves left upper extremity to command BLE: weakly moves bilateral lower extremities to command  Cerebellar: Unable to assess Gait: Unable to assess    ASSESSMENT/PLAN  Subarachnoid Hemorrhage :  scattered SAH in right sylvian fissure Etiology:  eliquis   Code Stroke CT head - Interval development of scattered small volume subarachnoid hemorrhage within the right Sylvian fissure. No significant mass effect. Upon review of recent MRA  there is question of a 3-4 mm outpouching at the takeoff of the right PCOM, which could potentially reflect a small aneurysm versus infundibulum, not appreciated on initial interpretation. This finding is not certain, as no aneurysm is described at this location on recent arteriogram. Evolving right MCA distribution infarcts, similar to prior. Aspects is 7. CTA head & neck -  3-4 mm outpouchings extending posteriorly and inferiorly from both supraclinoid ICAs, which could reflect small aneurysms versus vascular infundibula. Appearance is similar as compared to recent MRA from 12/30/2022. Moderate atheromatous plaque about the carotid bifurcations and carotid siphons without hemodynamically significant stenosis. Atheromatous plaque at the origin of the right vertebral artery with moderate to severe stenosis. LDL 33 HgbA1c 6.4 EEG- This study is suggestive of cortical dysfunction arising from right hemisphere likely secondary to underlying structural abnormality. Additionally there is moderate diffuse encephalopathy. No seizures were seen throughout the recording. Generalized sharp transients were noted. Recommend long term monitoring for further evaluation Long-term EEG 12/15: Continuous slow, generalized and lateralized right hemisphere, suggestive of cortical dysfunction arising  from right hemisphere and moderate diffuse encephalopathy, no seizures seen VTE prophylaxis - SCDs Eliquis (apixaban) daily prior to admission, now on No antithrombotic  Therapy recommendations: SNF Disposition: Transferred out of ICU, awaiting bed on 3 W.  Hx of Stroke/TIA 11/28-12/11- Acute Ischemic Infarct:  right MCA territory infarct with R M2 and A1 occlusion s/p IR with TICI 3 revascularization, etiology: Cardioembolic in the setting of A-fib  Code Stroke  CT head  -hyperdense right MCA, cytotoxic edema in right stratum and insula.  ASPECTS 7   CTA head & neck acute clot at the right terminal ICA and acute obstructing  right MCA M2 and ACA A1. CT perfusion 6/149 in the right MCA territory.  S/p IR via right ICA direct access with TICI3 MRI scattered acute right MCA distribution infarcts involving the right caudate and lentiform nuclei, right insula, and overlying right cerebral hemisphere, small volume petechial hemorrhage at the posterior right frontal region MRA  with interval revascularization of previously seen clot at the right ICA terminus and right M2 segment, Focal moderate distal right P2 stenosis. 2D Echo EF 60 to 65%.  LV severe concentric hypertrophy.  LA severely dilated LDL 33 HgbA1c 6.4    Abnormal EEG LTM connected Routine EEG 12/14 This study is suggestive of cortical dysfunction arising from right hemisphere likely secondary to underlying structural abnormality. Additionally there is moderate diffuse encephalopathy. No seizures were seen throughout the recording. Generalized sharp transients were noted. Recommend long term monitoring for further evaluation  Atrial fibrillation Home Meds: Eliquis Reversed with Kcentra at [facility] On tele Rate controlled On hold due to [facility]   Cerebral aneurysm now with [facility] CTA- -4 mm outpouchings extending posteriorly and inferiorly from both supraclinoid ICAs, which could reflect small aneurysms versus vascular infundibula. Appearance is similar as compared to recent MRA from 12/30/2022. Moderate atheromatous plaque about the carotid bifurcations and carotid siphons without hemodynamically significant stenosis. Atheromatous plaque at the origin of the right vertebral artery with moderate to severe stenosis. Nimodipine 60mg  q4hr  Yearly MRA follow up as outpt   Hypertensive Emergency Home meds: Imdur 120 mg, olmesartan-amlodipine-HCTZ 40-10-12.5 mg, spironolactone 25 mg Amlodipine 5mg , nimodipine 60mg  q4hr BP goal: SBP 130-150 Long-term BP goal normotensive   Hyperlipidemia Home meds: Lipitor 40 mg, resumed in hospital LDL 33, goal < 70 Statin  discontinued 12/17 due to elevated LFTs, will resume when LFTs return to baseline   Dysphagia Patient has post-stroke dysphagia, SLP consulted Continue NPO PEG Tube 12/9 Resume TF- Osmolite 1.2@55ml /hr- Initiate at 3ml/hr and increase by 37ml/hr q 8 hours until goal rate is each.  Free q6  Dietitian consulted Trace purulent drainage, tenderness around PEG- trauma re engaged CT Abd pending  PEG tube dislodged 12/15, Foley catheter placed in tract, Foley exchanged for PEG tube in IR on 12/16   Other Stroke Risk Factors  Obstructive sleep apnea,  on CPAP at home  Advanced age   Other acute issues Left knee pain - lidocaine patch - arthritis in bilateral knees- f/u outpatient  10 beat run of V. tach on 12/16-spontaneously terminated, will recheck BMP and magnesium 8 beat run of VT on 12/18- spontaneously resolved, asymptomatic - mag 2.2, K 4.8 Left abdominal wall infection-I&D performed 12/16  Other Active Problems Leukocytosis WBC 17.5 -> 15.0 -> 19.2-> 15.4 -> 13.7 Tmax 99.6 Blood cultures 1 out of 3 positive for Enterobacter cloaca  Begin cefepime 12/17 CXR, UA ordered  Trend Completed 7 days of ceftriaxone during last hospitalization due to  aspiration pna Transaminitis 176/286 -> 123/208-> 427/354 Trend, statin stopped 12/17  Hospital day # 4   Patient seen and examined by NP/APP with MD. MD to update note as needed.   Elmer Picker, DNP, FNP-BC [facility] Pager: [phone]  I have personally obtained history,examined this patient, reviewed notes, independently viewed imaging studies, participated in medical decision making and plan of care.ROS completed by me personally and pertinent positives fully documented  I have made any additions or clarifications directly to the above note. Agree with note above.  Continue antibiotics for abdominal wall infection as per trauma team.  Continue ongoing therapies.  Transfer to skilled nursing facility for  rehab in a few days is stable.  No family at the bedside.  Delia Heady, MD Medical Director [facility] Pager: [phone] 01/18/2023 2:12 PM  To contact Stroke Continuity provider, please refer to [email or web address]. After hours, contact General Neurology

## 2023-01-18 NOTE — Progress Notes (Signed)
Speech Language Pathology Treatment: Dysphagia  Patient Details Name: TALESHIA MORMANN MRN: [id number] DOB: [birth year] Today's Date: 01/18/2023 Time: 4098-1191 SLP Time Calculation (min) (ACUTE ONLY): 15 min  Assessment / Plan / Recommendation Clinical Impression  Patient seen by SLP for skilled treatment focused on dysphagia goals. Patient was awake, alert and daughter and granddaughter both in room. They reported that she ate majority of her lunch meal, but did not like the pureed broccoli. Patient's speech has been more intelligible but she continues to require cues to initiate and her cognitive processing is impaired. She told SLP that her knees and her bottom and/or back hurt and she requested pain medication. At times she was not responding to question topic SLP was asking about and family did confirm SLP's suspicion that she has a hearing impairment. Patient requested juice and was able to drink via straw sips. She exhibited very brief holding of a portion of liquid bolus in her oral cavity and continues to exhibit a suspected swallow initiation delay. Only one instance of very brief, delayed cough was observed. SLP recommending to continue with current PO diet of Dys 1 (puree) solids and thin liquids. SLP will continue to follow for speech, cognition, swallow function.   HPI HPI: 89 yo female presenting back to the hospital (transfer from ED at [facility]) from SNF on 12/14 with acute stroke symptoms and per EMS she had a new onset of left sided facial droop, numbness to left arm and significantly increased weakness of left arm. CTH showed showed interval development of scattered subarachnoid hemorrhage in the right sylvian fissure. Neurosurgery consulted and recommended Keppra 500mg . WBC elevated and trauma was consulted to evaluate PEG tube with CT abdomen pending. She was recently admitted 11/27-12/11 with L sided weakness and R MCA occlusion s/p IR revascularization with complicated arterial  cannulation requiring multiple sticks. Intubated 11/28-11/29. PMH includes: GERD, HTN, CHF, AFIB RVR on eliquis, HLD, OSA. MBSS 01/02/23 was limited due to oral holding, repeat MBS on 12/11 showed moderate oral dysphagia and suspected pharyngoesophageal dysphagia with prominent cricopharyngeaus and recommened PO diet of Dys 1 (puree), thin liquids.      SLP Plan  Continue with current plan of care      Recommendations for follow up therapy are one component of a multi-disciplinary discharge planning process, led by the attending physician.  Recommendations may be updated based on patient status, additional functional criteria and insurance authorization.    Recommendations  Diet recommendations: Dysphagia 1 (puree);Thin liquid Liquids provided via: Cup;Straw Medication Administration: Crushed with puree Supervision: Full supervision/cueing for compensatory strategies;Staff to assist with self feeding;Patient able to self feed Compensations: Slow rate;Small sips/bites Postural Changes and/or Swallow Maneuvers: Seated upright 90 degrees                  Oral care BID;Staff/trained caregiver to provide oral care   Frequent or constant Supervision/Assistance Dysphagia, unspecified (R13.10)     Continue with current plan of care     Angela Nevin, MA, CCC-SLP Speech Therapy

## 2023-01-18 NOTE — Care Management Important Message (Signed)
Important Message  Patient Details  Name: Jocelyn [name] MRN: [id number] Date of Birth: [birth year]   Important Message Given:  Yes - Medicare IM     Dorena Bodo 01/18/2023, 3:12 PM

## 2023-01-18 NOTE — Plan of Care (Signed)
  Problem: Health Behavior/Discharge Planning: Goal: Ability to manage health-related needs will improve Outcome: Progressing   Problem: Clinical Measurements: Goal: Ability to maintain clinical measurements within normal limits will improve Outcome: Progressing Goal: Diagnostic test results will improve Outcome: Progressing   

## 2023-01-19 ENCOUNTER — Encounter (HOSPITAL_COMMUNITY): Payer: Self-pay | Admitting: Neurology

## 2023-01-19 LAB — CBC
HCT: 28 % — ABNORMAL LOW (ref 36.0–46.0)
Hemoglobin: 9.1 g/dL — ABNORMAL LOW (ref 12.0–15.0)
MCH: 27.2 pg (ref 26.0–34.0)
MCHC: 32.5 g/dL (ref 30.0–36.0)
MCV: 83.6 fL (ref 80.0–100.0)
Platelets: 285 10*3/uL (ref 150–400)
RBC: 3.35 MIL/uL — ABNORMAL LOW (ref 3.87–5.11)
RDW: 14.7 % (ref 11.5–15.5)
WBC: 14.6 10*3/uL — ABNORMAL HIGH (ref 4.0–10.5)
nRBC: 0.5 % — ABNORMAL HIGH (ref 0.0–0.2)

## 2023-01-19 LAB — COMPREHENSIVE METABOLIC PANEL
ALT: 302 U/L — ABNORMAL HIGH (ref 0–44)
AST: 189 U/L — ABNORMAL HIGH (ref 15–41)
Albumin: 1.9 g/dL — ABNORMAL LOW (ref 3.5–5.0)
Alkaline Phosphatase: 100 U/L (ref 38–126)
Anion gap: 4 — ABNORMAL LOW (ref 5–15)
BUN: 27 mg/dL — ABNORMAL HIGH (ref 8–23)
CO2: 20 mmol/L — ABNORMAL LOW (ref 22–32)
Calcium: 8.2 mg/dL — ABNORMAL LOW (ref 8.9–10.3)
Chloride: 110 mmol/L (ref 98–111)
Creatinine, Ser: 0.51 mg/dL (ref 0.44–1.00)
GFR, Estimated: >60 mL/min (ref >60)
Glucose, Bld: 136 mg/dL — ABNORMAL HIGH (ref 70–99)
Potassium: 4.7 mmol/L (ref 3.5–5.1)
Sodium: 134 mmol/L — ABNORMAL LOW (ref 135–145)
Total Bilirubin: 0.7 mg/dL (ref <1.2)
Total Protein: 6.4 g/dL — ABNORMAL LOW (ref 6.5–8.1)

## 2023-01-19 LAB — CULTURE, BLOOD (ROUTINE X 2): Culture  Setup Time: NO GROWTH

## 2023-01-19 LAB — GLUCOSE, CAPILLARY
Glucose-Capillary: 128 mg/dL — ABNORMAL HIGH (ref 70–99)
Glucose-Capillary: 130 mg/dL — ABNORMAL HIGH (ref 70–99)
Glucose-Capillary: 135 mg/dL — ABNORMAL HIGH (ref 70–99)
Glucose-Capillary: 140 mg/dL — ABNORMAL HIGH (ref 70–99)
Glucose-Capillary: 149 mg/dL — ABNORMAL HIGH (ref 70–99)
Glucose-Capillary: 152 mg/dL — ABNORMAL HIGH (ref 70–99)

## 2023-01-19 MED ORDER — LIDOCAINE HCL (PF) 1 % IJ SOLN
20.0000 mL | Freq: Once | INTRAMUSCULAR | Status: AC
  Administered 2023-01-19: 20 mL
  Filled 2023-01-19: qty 20

## 2023-01-19 NOTE — TOC Progression Note (Signed)
Transition of Care [facility]) - Progression Note    Patient Details  Name: Jocelyn [name] MRN: [id number] Date of Birth: 06/23/1931  [facility] of [facility]) CM/SW Contact  Baldemar Lenis, [name] Phone Number: 01/19/2023, 3:45 PM  Clinical Narrative:   CSW updated by RN that patient's family may want to look for another SNF. CSW attempted to reach daughter, Jocelyn [name], left a voicemail. Awaiting call back.    Expected Discharge Plan: Skilled Nursing Facility    Expected Discharge Plan and Services                                               Social Determinants of Health (SDOH) Interventions SDOH Screenings   Food Insecurity: Patient Unable To Answer (01/14/2023)  Housing: Patient Unable To Answer (01/14/2023)  Transportation Needs: Unknown (01/14/2023)  Utilities: Patient Unable To Answer (01/14/2023)  Alcohol Screen: Low Risk  (12/03/2018)  Depression (PHQ2-9): Low Risk  (12/06/2022)  Financial Resource Strain: Medium Risk (07/26/2018)  Physical Activity: Inactive (07/26/2018)  Social Connections: Moderately Isolated (07/26/2018)  Stress: No Stress Concern Present (07/26/2018)  Tobacco Use: Low Risk  (01/19/2023)    Readmission Risk Interventions     No data to display

## 2023-01-19 NOTE — Progress Notes (Signed)
[facility] Progress Note     Subjective: CC-  No complaints this morning. Tolerating tube feedings at goal. WBC up 14.6 from 13.7, TMAX 99.1  Objective: Vital signs in last 24 hours: Temp:  [98.4 F (36.9 C)-101.1 F (38.4 C)] 101.1 F (38.4 C) (12/19 0744) Pulse Rate:  [84-99] 94 (12/19 0744) Resp:  [15-18] 16 (12/19 0744) BP: (114-140)/(52-64) 139/64 (12/19 0744) SpO2:  [94 %-100 %] 100 % (12/19 0744) Weight:  [67.9 kg] 67.9 kg (12/19 0432) Last BM Date : 01/18/23  Intake/Output from previous day: 12/18 0701 - 12/19 0700 In: -  Out: 600 [Urine:600] Intake/Output this shift: No intake/output data recorded.  PE: Gen:  Alert, NAD, pleasant Abd: soft, ND, +BS, PEG site with some purulent drainage on dressing, LLQ I&D site with penrose drain present and bloody fluid on dressing, induration tracks from I&D site to PEG site and there is now a more fluctuant and faintly erythematous area just lateral and inferior to the PEG  Lab Results:  Recent Labs    01/18/23 0554 01/19/23 0613  WBC 13.7* 14.6*  HGB 9.1* 9.1*  HCT 27.4* 28.0*  PLT 347 285   BMET Recent Labs    01/18/23 0554 01/19/23 0613  NA 136 134*  K 4.8 4.7  CL 111 110  CO2 19* 20*  GLUCOSE 155* 136*  BUN 31* 27*  CREATININE 0.79 0.51  CALCIUM 8.2* 8.2*   PT/INR No results for input(s): "LABPROT", "INR" in the last 72 hours. CMP     Component Value Date/Time   NA 134 (L) 01/19/2023 0613   NA 143 12/07/2022 1535   NA 141 04/13/2011 1125   K 4.7 01/19/2023 0613   K 3.4 (L) 04/13/2011 1125   CL 110 01/19/2023 0613   CL 105 04/13/2011 1125   CO2 20 (L) 01/19/2023 0613   CO2 29 04/13/2011 1125   GLUCOSE 136 (H) 01/19/2023 0613   GLUCOSE 111 (H) 04/13/2011 1125   BUN 27 (H) 01/19/2023 0613   BUN 16 12/07/2022 1535   BUN 14 04/13/2011 1125   CREATININE 0.51 01/19/2023 0613   CREATININE 0.80 08/01/2018 1544   CALCIUM 8.2 (L) 01/19/2023 0613   CALCIUM 9.2 04/13/2011 1125   PROT 6.4  (L) 01/19/2023 0613   PROT 7.6 12/07/2022 1535   PROT 7.8 04/13/2011 1125   ALBUMIN 1.9 (L) 01/19/2023 0613   ALBUMIN 4.4 12/07/2022 1535   ALBUMIN 4.0 04/13/2011 1125   AST 189 (H) 01/19/2023 0613   AST 24 04/13/2011 1125   ALT 302 (H) 01/19/2023 0613   ALT 20 04/13/2011 1125   ALKPHOS 100 01/19/2023 0613   ALKPHOS 51 04/13/2011 1125   BILITOT 0.7 01/19/2023 0613   BILITOT 0.8 12/07/2022 1535   BILITOT 0.6 04/13/2011 1125   GFRNONAA >60 01/19/2023 0613   GFRNONAA 67 08/01/2018 1544   GFRAA 78 08/01/2018 1544   Lipase  No results found for: "LIPASE"     Studies/Results: DG ABDOMEN PEG TUBE LOCATION Result Date: 01/18/2023 CLINICAL DATA:  Leaking PEG tube. Gastrostomy tube replacement 2 days ago. EXAM: ABDOMEN - 1 VIEW COMPARISON:  Radiographs 01/15/2023 and 12/30/2022.  CT 01/16/2023. FINDINGS: 1159 hours. Single-view of the mid to lower abdomen was obtained after contrast (30 cc Gastrografin) was injected via the PEG tube. The contrast is within the lumen of the stomach. No extravasated enteric contrast identified. There is contrast material within the rectosigmoid colon from previous administration. There is a nonobstructive bowel gas pattern. Soft tissue emphysema  within the anterior abdominal wall appears improved from recent CT. Foreign body overlying the left lateral abdominal wall is potentially a surgical drain or sponge. Correlate clinically. IMPRESSION: 1. No evidence of extravasated enteric contrast. Tip of the G-tube is within the gastric lumen. 2. Nonobstructive bowel gas pattern. 3. Improved soft tissue emphysema within the anterior abdominal wall. Possible surgical drain or sponge overlying the left lateral abdominal wall. Correlate clinically. Electronically Signed   By: Carey Bullocks M.D.   On: 01/18/2023 13:48    Anti-infectives: Anti-infectives (From admission, onward)    Start     Dose/Rate Route Frequency Ordered Stop   01/17/23 1130  ceFEPIme (MAXIPIME) 2 g  in sodium chloride 0.9 % 100 mL IVPB        2 g 200 mL/hr over 30 Minutes Intravenous Every 12 hours 01/17/23 1038 01/24/23 0959   01/16/23 0945  cefTRIAXone (ROCEPHIN) 2 g in sodium chloride 0.9 % 100 mL IVPB  Status:  Discontinued        2 g 200 mL/hr over 30 Minutes Intravenous Every 24 hours 01/16/23 0931 01/17/23 1038        Assessment/Plan Abdominal wall abscess -S/p PEG placement 12/9 Dr. Bedelia Person. PEG dislodged, tract maintained, able to get a foley into the tract. Exchanged for IR PEG 12/16.   -S/p bedside I&D of abdominal wall with penrose drain placement 12/16 by Dr. Janee Morn. - Tolerating tube feedings at goal. Xray yesterday confirmed PEG in good location without extravasation. Drainage around PEG is ongoing purulent drainage.  WBC slightly up and there is an area between PEG and I&D site that has become more fluctuant. Will order lidocaine and plan for further bedside I&D. Continue penrose. Continue antibiotics.   FEN - D1 diet, tube feeds at goal VTE - SCD's, sq heparin, DOAC held in the setting of ICH ID - Enterobacter Bacteremia, susceptibilities pending, cefepime 12/17>>    Transaminitis - alk phos and bilirubin are WNL; gallbladder is surgically absent  R SAH  Cerebral aneurysm  HTN emergency Hx ischemic R MCA stroke  A.fib  HLD ODA Osteoarthritis   I reviewed last 24 h vitals and pain scores, last 48 h intake and output, last 24 h labs and trends, and last 24 h imaging results.    LOS: 5 days    [name], [facility] 01/19/2023, 8:50 AM Please see Amion for pager number during day hours 7:00am-4:30pm

## 2023-01-19 NOTE — Plan of Care (Signed)
  Problem: Education: Goal: Knowledge of General Education information will improve Description: Including pain rating scale, medication(s)/side effects and non-pharmacologic comfort measures Outcome: Progressing   Problem: Health Behavior/Discharge Planning: Goal: Ability to manage health-related needs will improve Outcome: Progressing   Problem: Clinical Measurements: Goal: Ability to maintain clinical measurements within normal limits will improve Outcome: Progressing Goal: Will remain free from infection Outcome: Progressing Goal: Diagnostic test results will improve Outcome: Progressing Goal: Respiratory complications will improve Outcome: Progressing Goal: Cardiovascular complication will be avoided Outcome: Progressing   Problem: Activity: Goal: Risk for activity intolerance will decrease Outcome: Progressing   Problem: Nutrition: Goal: Adequate nutrition will be maintained Outcome: Progressing   Problem: Coping: Goal: Level of anxiety will decrease Outcome: Progressing   Problem: Elimination: Goal: Will not experience complications related to bowel motility Outcome: Progressing Goal: Will not experience complications related to urinary retention Outcome: Progressing   Problem: Pain Management: Goal: General experience of comfort will improve Outcome: Progressing   Problem: Safety: Goal: Ability to remain free from injury will improve Outcome: Progressing   Problem: Skin Integrity: Goal: Risk for impaired skin integrity will decrease Outcome: Progressing   Problem: Education: Goal: Ability to describe self-care measures that may prevent or decrease complications (Diabetes Survival Skills Education) will improve Outcome: Progressing Goal: Individualized Educational Video(s) Outcome: Progressing   Problem: Coping: Goal: Ability to adjust to condition or change in health will improve Outcome: Progressing   Problem: Fluid Volume: Goal: Ability to  maintain a balanced intake and output will improve Outcome: Progressing   Problem: Health Behavior/Discharge Planning: Goal: Ability to identify and utilize available resources and services will improve Outcome: Progressing Goal: Ability to manage health-related needs will improve Outcome: Progressing   Problem: Metabolic: Goal: Ability to maintain appropriate glucose levels will improve Outcome: Progressing   Problem: Nutritional: Goal: Maintenance of adequate nutrition will improve Outcome: Progressing Goal: Progress toward achieving an optimal weight will improve Outcome: Progressing   Problem: Skin Integrity: Goal: Risk for impaired skin integrity will decrease Outcome: Progressing   Problem: Tissue Perfusion: Goal: Adequacy of tissue perfusion will improve Outcome: Progressing   Problem: Education: Goal: Knowledge of disease or condition will improve Outcome: Progressing Goal: Knowledge of secondary prevention will improve (MUST DOCUMENT ALL) Outcome: Progressing Goal: Knowledge of patient specific risk factors will improve Loraine Leriche N/A or DELETE if not current risk factor) Outcome: Progressing   Problem: Intracerebral Hemorrhage Tissue Perfusion: Goal: Complications of Intracerebral Hemorrhage will be minimized Outcome: Progressing   Problem: Coping: Goal: Will verbalize positive feelings about self Outcome: Progressing Goal: Will identify appropriate support needs Outcome: Progressing   Problem: Health Behavior/Discharge Planning: Goal: Ability to manage health-related needs will improve Outcome: Progressing Goal: Goals will be collaboratively established with patient/family Outcome: Progressing   Problem: Self-Care: Goal: Ability to participate in self-care as condition permits will improve Outcome: Progressing Goal: Verbalization of feelings and concerns over difficulty with self-care will improve Outcome: Progressing Goal: Ability to communicate needs  accurately will improve Outcome: Progressing   Problem: Nutrition: Goal: Risk of aspiration will decrease Outcome: Progressing Goal: Dietary intake will improve Outcome: Progressing

## 2023-01-19 NOTE — Procedures (Signed)
Incision and Drainage Procedure Note  Pre-operative Diagnosis: Abdominal wall abscess  Post-operative Diagnosis: same  Indications: abscess/ infection  Anesthesia: 1% plain lidocaine  Procedure Details  The procedure, risks and complications have been discussed in detail (including but not limited to pain, infection, bleeding, need for repeat I&D) with the patient and her daughter, Gilford Raid. Verbal consent obtained from the daughter.  The skin was sterilely prepped and draped over the affected area in the usual fashion. After adequate local anesthesia, I&D with a #11 blade was performed on the abdominal wall abscess. Purulent drainage: present The patient was observed until stable.  Findings: Copious purulent drainage  EBL: <5cc  Drains: penrose drain sutured to skin  Condition: Tolerated procedure well and Stable   Complications: none  [name], PA-C [facility] 01/19/2023, 12:09 PM Please see Amion for pager number during day hours 7:00am-4:30pm .

## 2023-01-19 NOTE — Progress Notes (Signed)
Physical Therapy Treatment Patient Details Name: Jocelyn [name] MRN: [id number] DOB: 01-14-1932 Today's Date: 01/19/2023   History of Present Illness Pt is an 89 y.o. female who presented to the ED 12/13 due to LUE weakness, left facial droop and CTH showed development of scattered SAH. PEG replaced 12/16. Recent hospitalization 11/27-12/11 due to R MCA ischemic stroke for which she underwent IR thrombectomy and PEG placement.  She was discharged to [facility] 12/11. PMH: HTN, CHF, a fib with RVR on eliquis, HLD, OSA    PT Comments  Pt resting in bed on arrival, slow progress towards acute goals this session due to increased L flank pain with mobility. Pt requiring max A to come to sitting EOB and needing max A for midline sitting with multimodal cues as pt pushing L with RUE and crying out in pain with attempts to bring trunk to midline. Transfers deferred due to poor sitting balance and for pt safety. Attempting reaching outside BOS with RUE to correct L lean; unsuccessful. Current plan remains appropriate to address deficits and maximize functional independence and decrease caregiver burden. Pt continues to benefit from skilled PT services to progress toward functional mobility goals.      If plan is discharge home, recommend the following: Two people to help with walking and/or transfers;Direct supervision/assist for medications management;Assist for transportation;Two people to help with bathing/dressing/bathroom;Assistance with cooking/housework;Assistance with feeding;Direct supervision/assist for financial management;Help with stairs or ramp for entrance;Supervision due to cognitive status   Can travel by private vehicle     No  Equipment Recommendations  None recommended by PT    Recommendations for Other Services       Precautions / Restrictions Precautions Precautions: Fall Precaution Comments: PEG tube, abd binder Restrictions Weight Bearing Restrictions Per Provider Order: No      Mobility  Bed Mobility Overal bed mobility: Needs Assistance Bed Mobility: Sit to Supine, Rolling, Sidelying to Sit Rolling: Max assist Sidelying to sit: Max assist     Sit to sidelying: Max assist General bed mobility comments: cues for moving R leg toward EOB and able to with increased time, though assist for lifting trunk and positioning hips, assist for all aspects back to supine    Transfers Overall transfer level: Needs assistance                 General transfer comment: nt this session as pt grimiacing in pain in sitting    Ambulation/Gait                   Stairs             Wheelchair Mobility     Tilt Bed    Modified Rankin (Stroke Patients Only) Modified Rankin (Stroke Patients Only) Pre-Morbid Rankin Score: Severe disability Modified Rankin: Severe disability     Balance Overall balance assessment: Needs assistance Sitting-balance support: Bilateral upper extremity supported, Feet supported Sitting balance-Leahy Scale: Poor Sitting balance - Comments: max A initially, progressed to close CGA while leaning on L elbow Postural control: Left lateral lean                                  Cognition Arousal: Alert Behavior During Therapy: Flat affect Overall Cognitive Status: Impaired/Different from baseline Area of Impairment: Orientation, Attention, Following commands, Memory, Safety/judgement, Awareness, Problem solving                 Orientation Level:  Disoriented to, Situation Current Attention Level: Focused Memory: Decreased recall of precautions, Decreased short-term memory Following Commands: Follows one step commands inconsistently Safety/Judgement: Decreased awareness of safety, Decreased awareness of deficits Awareness: Intellectual Problem Solving: Slow processing, Decreased initiation, Difficulty sequencing General Comments: Very limited attention, needs redirection. Poor initiation for  mobility        Exercises      General Comments General comments (skin integrity, edema, etc.): HR up to 120bpm sitting up EOB      Pertinent Vitals/Pain Pain Assessment Pain Assessment: Faces Faces Pain Scale: Hurts even more Pain Location: bilateral knees and bottom/back Pain Descriptors / Indicators: Grimacing, Discomfort Pain Intervention(s): Monitored during session, Limited activity within patient's tolerance    Home Living Family/patient expects to be discharged to:: Skilled nursing facility                        Prior Function            PT Goals (current goals can now be found in the care plan section) Acute Rehab PT Goals Patient Stated Goal: not stated PT Goal Formulation: With patient/family Time For Goal Achievement: 01/29/23 Progress towards PT goals: Progressing toward goals    Frequency    Min 1X/week      PT Plan      Co-evaluation              AM-PAC PT "6 Clicks" Mobility   Outcome Measure  Help needed turning from your back to your side while in a flat bed without using bedrails?: Total Help needed moving from lying on your back to sitting on the side of a flat bed without using bedrails?: Total Help needed moving to and from a bed to a chair (including a wheelchair)?: Total Help needed standing up from a chair using your arms (e.g., wheelchair or bedside chair)?: Total Help needed to walk in hospital room?: Total Help needed climbing 3-5 steps with a railing? : Total 6 Click Score: 6    End of Session   Activity Tolerance: Patient limited by pain Patient left: in bed;with call bell/phone within reach;with nursing/sitter in room;with bed alarm set Nurse Communication: Mobility status PT Visit Diagnosis: Other abnormalities of gait and mobility (R26.89);Hemiplegia and hemiparesis;Other symptoms and signs involving the nervous system (R29.898) Hemiplegia - Right/Left: Left Hemiplegia - dominant/non-dominant:  Non-dominant Hemiplegia - caused by: Nontraumatic intracerebral hemorrhage     Time: 1532-1600 PT Time Calculation (min) (ACUTE ONLY): 28 min  Charges:    $Therapeutic Activity: 23-37 mins PT General Charges $$ ACUTE PT VISIT: 1 Visit                     Jocelyn Metheny R. PTA Acute Rehabilitation Services Office: [phone]   Catalina Antigua 01/19/2023, 4:13 PM

## 2023-01-19 NOTE — Progress Notes (Addendum)
STROKE TEAM PROGRESS NOTE   BRIEF HPI [name] is a 89 y.o. female with history of Arthritis of both knees, Colitis, Essential hypertension, GERD, iron deficiency anemia, Hyperlipidemia, Lumbar pain, OSA on CPAP (08/01/2015), Overweight, Prediabetes, and SOB., atrial fibrillation (on Eliquis) who was recently discharged from the Stroke service at [facility] on 12/11 after management of an acute right MCA territory infarct in the setting of atrial fibrillation. She had presented at that time with left-sided weakness and confusion. Imaging had revealed a right A1 and M2 occlusion and she underwent successful mechanical thrombectomy, with TICI 3 revascularization. which was successful. Her Eliquis was restarted on 12/10 for her chronic A-fib. She was initially unable to pass her swallow study, and PEG tube was inserted on 12/9 with tube feeding started, although patient did pass swallow study afterwards and was beginning to take foods by mouth at the time of her discharge. She did have some pain in her left knee, which was successfully treated with lidocaine patches. She was discharged to a SNF.   She re-presented to the ED at [facility] on Friday from [facility] rehab facility for acute stroke symptoms. LKN was 1830. She had been at [facility] for LUE weakness. Per EMS she had new onset L side facial droop, numbness to L arm, and significantly increased weakness to L arm. CTH showed interval development of scattered subarachnoid hemorrhage in the right sylvian fissure. Teleneurology was consulted. NIHSS was 17. Teleneurology advised DOAC reversal with KCentra, BP management, EEG for observed facial twitching and admission as well as Neurosurgery consult. Neurosurgery recommended Keppra 500 mg. CTA showed no LVO; atheromatous plaque was again noted. 3-4 mm outpouchings extending posteriorly and inferiorly from both supraclinoid ICAs were noted, which Radiology felt could reflect small aneurysms versus vascular  infundibula; the outpouchings were similar in appearance compared to recent MRA from 12/30/2022.  NIH on Admission 17  SIGNIFICANT HOSPITAL EVENTS 12/11- Discharged to rehab 12/14- Admitted with [facility] 12/15-dislodged PEG tube, plan to replace and IR on 12/16 12/16-transferred out of the ICU, incision and drainage of left abdominal wall infection. 12/17-blood cultures positive for Enterobacter cloacae, cefepime started  INTERIM HISTORY/SUBJECTIVE Patient has been hemodynamically stable.  Neurological exam is unchanged with persistent left hemiparesis. Tmax 101.1. Remains on abx. Trauma following due to abdominal abscess, they would like to watch her inpatient for a couple of days to ensure infection is clearing  WBC elevated to 14.6.  Await results of sensitivity of Enterobacter detected in blood culture from 01/15/2023.  Neurological exam is unchanged.  Vital signs stable.  OBJECTIVE  CBC    Component Value Date/Time   WBC 14.6 (H) 01/19/2023 0613   RBC 3.35 (L) 01/19/2023 0613   HGB 9.1 (L) 01/19/2023 0613   HGB 12.5 09/19/2022 0916   HCT 28.0 (L) 01/19/2023 0613   HCT 39.6 09/19/2022 0916   PLT 285 01/19/2023 0613   PLT 169 09/19/2022 0916   MCV 83.6 01/19/2023 0613   MCV 87 09/19/2022 0916   MCV 82 04/13/2011 1125   MCH 27.2 01/19/2023 0613   MCHC 32.5 01/19/2023 0613   RDW 14.7 01/19/2023 0613   RDW 12.9 09/19/2022 0916   RDW 14.4 04/13/2011 1125   LYMPHSABS 1.3 01/13/2023 2020   LYMPHSABS 3.7 (H) 07/08/2015 1136   MONOABS 0.7 01/13/2023 2020   EOSABS 0.1 01/13/2023 2020   EOSABS 0.2 07/08/2015 1136   BASOSABS 0.0 01/13/2023 2020   BASOSABS 0.1 07/08/2015 1136    BMET  Component Value Date/Time   NA 134 (L) 01/19/2023 0613   NA 143 12/07/2022 1535   NA 141 04/13/2011 1125   K 4.7 01/19/2023 0613   K 3.4 (L) 04/13/2011 1125   CL 110 01/19/2023 0613   CL 105 04/13/2011 1125   CO2 20 (L) 01/19/2023 0613   CO2 29 04/13/2011 1125   GLUCOSE 136 (H) 01/19/2023  0613   GLUCOSE 111 (H) 04/13/2011 1125   BUN 27 (H) 01/19/2023 0613   BUN 16 12/07/2022 1535   BUN 14 04/13/2011 1125   CREATININE 0.51 01/19/2023 0613   CREATININE 0.80 08/01/2018 1544   CALCIUM 8.2 (L) 01/19/2023 0613   CALCIUM 9.2 04/13/2011 1125   EGFR 74 12/07/2022 1535   GFRNONAA >60 01/19/2023 0613   GFRNONAA 67 08/01/2018 1544    IMAGING past 24 hours No results found.   Vitals:   01/19/23 0432 01/19/23 0744 01/19/23 1129 01/19/23 1300  BP:  139/64 (!) 138/59   Pulse:  94 89   Resp:  16 17   Temp:  (!) 101.1 F (38.4 C) 99.4 F (37.4 C)   TempSrc:  Oral Oral   SpO2:  100% (!) 53%   Weight: 67.9 kg   67.9 kg  Height:    5\' 3"  (1.6 m)     PHYSICAL EXAM General:  Alert, well-nourished, well-developed elderly patient in no acute distress Psych:  Mood and affect appropriate for situation CV: Regular rate and rhythm on monitor Respiratory:  Regular, unlabored respirations on room air GI: Abdomen soft and nontender  Neurological Examination Mental Status: Awakens to voice. Able to give her name but unable to correctly answer  orientation questions. Able to protrude tongue and weakly squeeze examiner's hand to command. Severe dysarthria.  Usually responds with one-word answers but can sometimes make a short sentence Cranial Nerves: II:  PERRL   III,IV, VI: No ptosis.Eyes are conjugate.  V: Reacts to touch bilaterally  VII: Smiles symmetrically to command  VIII: Hearing intact to voice IX,X: Gag reflex deferred XI: Unable to assess due to poor comprehension of commands XII: Midline tongue extension Motor/Sensory: RUE: Weakly grips examiner's hand.  Able to raise arm off the bed LUE: Weakly moves left upper extremity to command BLE: weakly moves bilateral lower extremities to command Cerebellar: Unable to assess Gait: Unable to assess    ASSESSMENT/PLAN  Subarachnoid Hemorrhage :  scattered SAH in right sylvian fissure Etiology:  eliquis   Code Stroke CT  head - Interval development of scattered small volume subarachnoid hemorrhage within the right Sylvian fissure. No significant mass effect. Upon review of recent MRA there is question of a 3-4 mm outpouching at the takeoff of the right PCOM, which could potentially reflect a small aneurysm versus infundibulum, not appreciated on initial interpretation. This finding is not certain, as no aneurysm is described at this location on recent arteriogram. Evolving right MCA distribution infarcts, similar to prior. Aspects is 7. CTA head & neck -  3-4 mm outpouchings extending posteriorly and inferiorly from both supraclinoid ICAs, which could reflect small aneurysms versus vascular infundibula. Appearance is similar as compared to recent MRA from 12/30/2022. Moderate atheromatous plaque about the carotid bifurcations and carotid siphons without hemodynamically significant stenosis. Atheromatous plaque at the origin of the right vertebral artery with moderate to severe stenosis. LDL 33 HgbA1c 6.4 EEG- This study is suggestive of cortical dysfunction arising from right hemisphere likely secondary to underlying structural abnormality. Additionally there is moderate diffuse encephalopathy. No seizures were  seen throughout the recording. Generalized sharp transients were noted. Recommend long term monitoring for further evaluation Long-term EEG 12/15: Continuous slow, generalized and lateralized right hemisphere, suggestive of cortical dysfunction arising from right hemisphere and moderate diffuse encephalopathy, no seizures seen VTE prophylaxis - SCDs Eliquis (apixaban) daily prior to admission, now on No antithrombotic  Therapy recommendations: SNF Disposition: Transferred out of ICU, awaiting bed on 3 W.  Hx of Stroke/TIA 11/28-12/11- Acute Ischemic Infarct:  right MCA territory infarct with R M2 and A1 occlusion s/p IR with TICI 3 revascularization, etiology: Cardioembolic in the setting of A-fib  Code Stroke   CT head  -hyperdense right MCA, cytotoxic edema in right stratum and insula.  ASPECTS 7   CTA head & neck acute clot at the right terminal ICA and acute obstructing right MCA M2 and ACA A1. CT perfusion 6/149 in the right MCA territory.  S/p IR via right ICA direct access with TICI3 MRI scattered acute right MCA distribution infarcts involving the right caudate and lentiform nuclei, right insula, and overlying right cerebral hemisphere, small volume petechial hemorrhage at the posterior right frontal region MRA  with interval revascularization of previously seen clot at the right ICA terminus and right M2 segment, Focal moderate distal right P2 stenosis. 2D Echo EF 60 to 65%.  LV severe concentric hypertrophy.  LA severely dilated LDL 33 HgbA1c 6.4  Abnormal EEG LTM connected Routine EEG 12/14 This study is suggestive of cortical dysfunction arising from right hemisphere likely secondary to underlying structural abnormality. Additionally there is moderate diffuse encephalopathy. No seizures were seen throughout the recording. Generalized sharp transients were noted. Recommend long term monitoring for further evaluation  Atrial fibrillation Home Meds: Eliquis Reversed with Kcentra at [facility] On tele Rate controlled On hold due to [facility]   Cerebral aneurysm now with [facility] CTA- -4 mm outpouchings extending posteriorly and inferiorly from both supraclinoid ICAs, which could reflect small aneurysms versus vascular infundibula. Appearance is similar as compared to recent MRA from 12/30/2022. Moderate atheromatous plaque about the carotid bifurcations and carotid siphons without hemodynamically significant stenosis. Atheromatous plaque at the origin of the right vertebral artery with moderate to severe stenosis. Nimodipine 60mg  q4hr  Yearly MRA follow up as outpt   Hypertensive Emergency Home meds: Imdur 120 mg, olmesartan-amlodipine-HCTZ 40-10-12.5 mg, spironolactone 25 mg Amlodipine 5mg , nimodipine  60mg  q4hr BP goal: SBP 130-150 Long-term BP goal normotensive   Hyperlipidemia Home meds: Lipitor 40 mg, resumed in hospital LDL 33, goal < 70 Statin discontinued 12/17 due to elevated LFTs, will resume when LFTs return to baseline   Dysphagia Patient has post-stroke dysphagia, SLP consulted Continue NPO PEG Tube 12/9 Resume TF- Osmolite 1.2@55ml /hr- Initiate at 27ml/hr and increase by 58ml/hr q 8 hours until goal rate is each.  Free q6  Dietitian consulted Trace purulent drainage, tenderness around PEG- trauma re engaged CT Abd pending  PEG tube dislodged 12/15, Foley catheter placed in tract, Foley exchanged for PEG tube in IR on 12/16   Other Stroke Risk Factors  Obstructive sleep apnea,  on CPAP at home  Advanced age   Other acute issues Left knee pain - lidocaine patch - arthritis in bilateral knees- f/u outpatient  10 beat run of V. tach on 12/16-spontaneously terminated, will recheck BMP and magnesium 8 beat run of VT on 12/18- spontaneously resolved, asymptomatic - mag 2.2, K 4.8 Left abdominal wall infection-I&D performed 12/16  Other Active Problems Leukocytosis WBC 17.5 -> 15.0 -> 19.2-> 15.4 -> 13.7 -> 14.6  Tmax 99.6 Blood cultures 1 out of 3 positive for Enterobacter cloaca  Begin cefepime 12/17 CXR, UA ordered  Trend Completed 7 days of ceftriaxone during last hospitalization due to aspiration pna Transaminitis 176/286 -> 123/208-> 427/354 -> 189/302 Trend, statin stopped 12/17  Hospital day # 5   Patient seen and examined by NP/APP with MD. MD to update note as needed.   Elmer Picker, DNP, FNP-BC [facility] Pager: [phone]  I have personally obtained history,examined this patient, reviewed notes, independently viewed imaging studies, participated in medical decision making and plan of care.ROS completed by me personally and pertinent positives fully documented  I have made any additions or clarifications directly to the above  note. Agree with note above.  Patient remains febrile with elevated white count despite being on antibiotics.  Await sensitivity results.  Trauma team following abdominal wall abscess and plan on more drainage today.  No family available at the bedside.  Greater than 50% time during this 35-minute visit was spent in counseling and coordination of care and discussion patient and care team answering questions  Delia Heady, MD Medical Director [facility] Pager: [phone] 01/19/2023 5:22 PM   To contact Stroke Continuity provider, please refer to [email or web address]. After hours, contact General Neurology

## 2023-01-20 LAB — CBC
HCT: 26.3 % — ABNORMAL LOW (ref 36.0–46.0)
Hemoglobin: 8.7 g/dL — ABNORMAL LOW (ref 12.0–15.0)
MCH: 27 pg (ref 26.0–34.0)
MCHC: 33.1 g/dL (ref 30.0–36.0)
MCV: 81.7 fL (ref 80.0–100.0)
Platelets: 271 10*3/uL (ref 150–400)
RBC: 3.22 MIL/uL — ABNORMAL LOW (ref 3.87–5.11)
RDW: 14.5 % (ref 11.5–15.5)
WBC: 17.5 10*3/uL — ABNORMAL HIGH (ref 4.0–10.5)
nRBC: 0.3 % — ABNORMAL HIGH (ref 0.0–0.2)

## 2023-01-20 LAB — GLUCOSE, CAPILLARY
Glucose-Capillary: 116 mg/dL — ABNORMAL HIGH (ref 70–99)
Glucose-Capillary: 135 mg/dL — ABNORMAL HIGH (ref 70–99)
Glucose-Capillary: 142 mg/dL — ABNORMAL HIGH (ref 70–99)
Glucose-Capillary: 147 mg/dL — ABNORMAL HIGH (ref 70–99)
Glucose-Capillary: 151 mg/dL — ABNORMAL HIGH (ref 70–99)
Glucose-Capillary: 180 mg/dL — ABNORMAL HIGH (ref 70–99)

## 2023-01-20 LAB — COMPREHENSIVE METABOLIC PANEL
ALT: 216 U/L — ABNORMAL HIGH (ref 0–44)
AST: 110 U/L — ABNORMAL HIGH (ref 15–41)
Albumin: 1.8 g/dL — ABNORMAL LOW (ref 3.5–5.0)
Alkaline Phosphatase: 88 U/L (ref 38–126)
Anion gap: 6 (ref 5–15)
BUN: 21 mg/dL (ref 8–23)
CO2: 19 mmol/L — ABNORMAL LOW (ref 22–32)
Calcium: 8.5 mg/dL — ABNORMAL LOW (ref 8.9–10.3)
Chloride: 108 mmol/L (ref 98–111)
Creatinine, Ser: 0.7 mg/dL (ref 0.44–1.00)
GFR, Estimated: >60 mL/min (ref >60)
Glucose, Bld: 150 mg/dL — ABNORMAL HIGH (ref 70–99)
Potassium: 4.5 mmol/L (ref 3.5–5.1)
Sodium: 133 mmol/L — ABNORMAL LOW (ref 135–145)
Total Bilirubin: 0.6 mg/dL (ref <1.2)
Total Protein: 6.2 g/dL — ABNORMAL LOW (ref 6.5–8.1)

## 2023-01-20 LAB — CULTURE, BLOOD (ROUTINE X 2): Culture: NO GROWTH

## 2023-01-20 MED ORDER — ZINC OXIDE 40 % EX OINT
TOPICAL_OINTMENT | Freq: Three times a day (TID) | CUTANEOUS | Status: DC
Start: 2023-01-20 — End: 2023-02-07
  Administered 2023-01-25 – 2023-02-02 (×4): 1 via TOPICAL
  Filled 2023-01-20 (×2): qty 57

## 2023-01-20 MED ORDER — BANATROL TF EN LIQD
60.0000 mL | Freq: Two times a day (BID) | ENTERAL | Status: DC
Start: 2023-01-20 — End: 2023-02-07
  Administered 2023-01-20 – 2023-02-07 (×37): 60 mL
  Filled 2023-01-20 (×37): qty 60

## 2023-01-20 NOTE — Progress Notes (Addendum)
STROKE TEAM PROGRESS NOTE   BRIEF HPI [name] is a 89 y.o. female with history of Arthritis of both knees, Colitis, Essential hypertension, GERD, iron deficiency anemia, Hyperlipidemia, Lumbar pain, OSA on CPAP (08/01/2015), Overweight, Prediabetes, and SOB., atrial fibrillation (on Eliquis) who was recently discharged from the Stroke service at [facility] on 12/11 after management of an acute right MCA territory infarct in the setting of atrial fibrillation. She had presented at that time with left-sided weakness and confusion. Imaging had revealed a right A1 and M2 occlusion and she underwent successful mechanical thrombectomy, with TICI 3 revascularization. which was successful. Her Eliquis was restarted on 12/10 for her chronic A-fib. She was initially unable to pass her swallow study, and PEG tube was inserted on 12/9 with tube feeding started, although patient did pass swallow study afterwards and was beginning to take foods by mouth at the time of her discharge. She did have some pain in her left knee, which was successfully treated with lidocaine patches. She was discharged to a SNF.   She re-presented to the ED at [facility] on Friday from [facility] rehab facility for acute stroke symptoms. LKN was 1830. She had been at [facility] for LUE weakness. Per EMS she had new onset L side facial droop, numbness to L arm, and significantly increased weakness to L arm. CTH showed interval development of scattered subarachnoid hemorrhage in the right sylvian fissure. Teleneurology was consulted. NIHSS was 17. Teleneurology advised DOAC reversal with KCentra, BP management, EEG for observed facial twitching and admission as well as Neurosurgery consult. Neurosurgery recommended Keppra 500 mg. CTA showed no LVO; atheromatous plaque was again noted. 3-4 mm outpouchings extending posteriorly and inferiorly from both supraclinoid ICAs were noted, which Radiology felt could reflect small aneurysms versus vascular  infundibula; the outpouchings were similar in appearance compared to recent MRA from 12/30/2022.  NIH on Admission 17  SIGNIFICANT HOSPITAL EVENTS 12/11- Discharged to rehab 12/14- Admitted with [facility] 12/15-dislodged PEG tube, plan to replace and IR on 12/16 12/16-transferred out of the ICU, incision and drainage of left abdominal wall infection. 12/17-blood cultures positive for Enterobacter cloacae, cefepime started 12/19-additional I&D of abdominal wall performed  INTERIM HISTORY/SUBJECTIVE Patient has been hemodynamically stable.  Neurological exam is unchanged with persistent left hemiparesis.  She remains febrile with Tmax 101.1.   Marland Kitchen  Leukocytosis is worsened today with WBC of 17.5.  OBJECTIVE  CBC    Component Value Date/Time   WBC 17.5 (H) 01/20/2023 0401   RBC 3.22 (L) 01/20/2023 0401   HGB 8.7 (L) 01/20/2023 0401   HGB 12.5 09/19/2022 0916   HCT 26.3 (L) 01/20/2023 0401   HCT 39.6 09/19/2022 0916   PLT 271 01/20/2023 0401   PLT 169 09/19/2022 0916   MCV 81.7 01/20/2023 0401   MCV 87 09/19/2022 0916   MCV 82 04/13/2011 1125   MCH 27.0 01/20/2023 0401   MCHC 33.1 01/20/2023 0401   RDW 14.5 01/20/2023 0401   RDW 12.9 09/19/2022 0916   RDW 14.4 04/13/2011 1125   LYMPHSABS 1.3 01/13/2023 2020   LYMPHSABS 3.7 (H) 07/08/2015 1136   MONOABS 0.7 01/13/2023 2020   EOSABS 0.1 01/13/2023 2020   EOSABS 0.2 07/08/2015 1136   BASOSABS 0.0 01/13/2023 2020   BASOSABS 0.1 07/08/2015 1136    BMET    Component Value Date/Time   NA 133 (L) 01/20/2023 0401   NA 143 12/07/2022 1535   NA 141 04/13/2011 1125   K 4.5 01/20/2023 0401  K 3.4 (L) 04/13/2011 1125   CL 108 01/20/2023 0401   CL 105 04/13/2011 1125   CO2 19 (L) 01/20/2023 0401   CO2 29 04/13/2011 1125   GLUCOSE 150 (H) 01/20/2023 0401   GLUCOSE 111 (H) 04/13/2011 1125   BUN 21 01/20/2023 0401   BUN 16 12/07/2022 1535   BUN 14 04/13/2011 1125   CREATININE 0.70 01/20/2023 0401   CREATININE 0.80 08/01/2018 1544    CALCIUM 8.5 (L) 01/20/2023 0401   CALCIUM 9.2 04/13/2011 1125   EGFR 74 12/07/2022 1535   GFRNONAA >60 01/20/2023 0401   GFRNONAA 67 08/01/2018 1544    IMAGING past 24 hours No results found.   Vitals:   01/19/23 2356 01/20/23 0346 01/20/23 0736 01/20/23 1107  BP: (!) 125/59 (!) 129/57 121/65 (!) 138/55  Pulse: (!) 108 98 93 98  Resp: 18 18 17 17   Temp: 99.5 F (37.5 C) 98.7 F (37.1 C) 97.7 F (36.5 C) (!) 97.5 F (36.4 C)  TempSrc: Oral Oral Oral Oral  SpO2: 100% 100% 100% 99%  Weight:      Height:         PHYSICAL EXAM General:  Alert, well-nourished, well-developed elderly patient in no acute distress Psych:  Mood and affect appropriate for situation CV: Regular rate and rhythm on monitor Respiratory:  Regular, unlabored respirations on room air GI: Abdomen soft and nontender  Neurological Examination Mental Status: Awakens to voice. Able to give her name but unable to correctly answer  orientation questions. Able to protrude tongue and weakly squeeze examiner's hand to command. Severe dysarthria.  Usually responds with one-word answers but can sometimes make a short sentence Cranial Nerves: II:  PERRL   III,IV, VI: No ptosis.Eyes are conjugate.  V: Reacts to touch bilaterally  VII: Smiles symmetrically to command  VIII: Hearing intact to voice IX,X: Gag reflex deferred XI: Unable to assess due to poor comprehension of commands XII: Midline tongue extension Motor/Sensory: RUE: Weakly grips examiner's hand.  Able to raise arm off the bed LUE: Weakly moves left upper extremity to command BLE: weakly moves bilateral lower extremities to command Cerebellar: Unable to assess Gait: Unable to assess    ASSESSMENT/PLAN  Subarachnoid Hemorrhage :  scattered SAH in right sylvian fissure Etiology:  eliquis   Code Stroke CT head - Interval development of scattered small volume subarachnoid hemorrhage within the right Sylvian fissure. No significant mass effect. Upon  review of recent MRA there is question of a 3-4 mm outpouching at the takeoff of the right PCOM, which could potentially reflect a small aneurysm versus infundibulum, not appreciated on initial interpretation. This finding is not certain, as no aneurysm is described at this location on recent arteriogram. Evolving right MCA distribution infarcts, similar to prior. Aspects is 7. CTA head & neck -  3-4 mm outpouchings extending posteriorly and inferiorly from both supraclinoid ICAs, which could reflect small aneurysms versus vascular infundibula. Appearance is similar as compared to recent MRA from 12/30/2022. Moderate atheromatous plaque about the carotid bifurcations and carotid siphons without hemodynamically significant stenosis. Atheromatous plaque at the origin of the right vertebral artery with moderate to severe stenosis. LDL 33 HgbA1c 6.4 EEG- This study is suggestive of cortical dysfunction arising from right hemisphere likely secondary to underlying structural abnormality. Additionally there is moderate diffuse encephalopathy. No seizures were seen throughout the recording. Generalized sharp transients were noted. Recommend long term monitoring for further evaluation Long-term EEG 12/15: Continuous slow, generalized and lateralized right hemisphere, suggestive of  cortical dysfunction arising from right hemisphere and moderate diffuse encephalopathy, no seizures seen VTE prophylaxis - SCDs Eliquis (apixaban) daily prior to admission, now on No antithrombotic  Therapy recommendations: SNF Disposition: Transferred out of ICU, awaiting bed on 3 W.  Hx of Stroke/TIA 11/28-12/11- Acute Ischemic Infarct:  right MCA territory infarct with R M2 and A1 occlusion s/p IR with TICI 3 revascularization, etiology: Cardioembolic in the setting of A-fib  Code Stroke  CT head  -hyperdense right MCA, cytotoxic edema in right stratum and insula.  ASPECTS 7   CTA head & neck acute clot at the right terminal ICA  and acute obstructing right MCA M2 and ACA A1. CT perfusion 6/149 in the right MCA territory.  S/p IR via right ICA direct access with TICI3 MRI scattered acute right MCA distribution infarcts involving the right caudate and lentiform nuclei, right insula, and overlying right cerebral hemisphere, small volume petechial hemorrhage at the posterior right frontal region MRA  with interval revascularization of previously seen clot at the right ICA terminus and right M2 segment, Focal moderate distal right P2 stenosis. 2D Echo EF 60 to 65%.  LV severe concentric hypertrophy.  LA severely dilated LDL 33 HgbA1c 6.4  Abnormal EEG LTM connected Routine EEG 12/14 This study is suggestive of cortical dysfunction arising from right hemisphere likely secondary to underlying structural abnormality. Additionally there is moderate diffuse encephalopathy. No seizures were seen throughout the recording. Generalized sharp transients were noted. Recommend long term monitoring for further evaluation  Atrial fibrillation Home Meds: Eliquis Reversed with Kcentra at [facility] On tele Rate controlled On hold due to [facility]   Cerebral aneurysm now with [facility] CTA- -4 mm outpouchings extending posteriorly and inferiorly from both supraclinoid ICAs, which could reflect small aneurysms versus vascular infundibula. Appearance is similar as compared to recent MRA from 12/30/2022. Moderate atheromatous plaque about the carotid bifurcations and carotid siphons without hemodynamically significant stenosis. Atheromatous plaque at the origin of the right vertebral artery with moderate to severe stenosis. Nimodipine 60mg  q4hr  Yearly MRA follow up as outpt   Hypertensive Emergency Home meds: Imdur 120 mg, olmesartan-amlodipine-HCTZ 40-10-12.5 mg, spironolactone 25 mg Amlodipine 5mg , nimodipine 60mg  q4hr BP goal: SBP 130-150 Long-term BP goal normotensive   Hyperlipidemia Home meds: Lipitor 40 mg, resumed in hospital LDL 33, goal <  70 Statin discontinued 12/17 due to elevated LFTs, will resume when LFTs return to baseline   Dysphagia Patient has post-stroke dysphagia, SLP consulted Continue NPO PEG Tube 12/9 Resume TF- Osmolite 1.2@55ml /hr- Initiate at 81ml/hr and increase by 12ml/hr q 8 hours until goal rate is each.  Free q6  Dietitian consulted Trace purulent drainage, tenderness around PEG- trauma re engaged CT Abd pending  PEG tube dislodged 12/15, Foley catheter placed in tract, Foley exchanged for PEG tube in IR on 12/16   Other Stroke Risk Factors  Obstructive sleep apnea,  on CPAP at home  Advanced age   Other acute issues Left knee pain - lidocaine patch - arthritis in bilateral knees- f/u outpatient  10 beat run of V. tach on 12/16-spontaneously terminated, will recheck BMP and magnesium 8 beat run of VT on 12/18- spontaneously resolved, asymptomatic - mag 2.2, K 4.8 Left abdominal wall infection-I&D performed 12/16  Other Active Problems Leukocytosis WBC 17.5 -> 15.0 -> 19.2-> 15.4 -> 13.7 -> 14.6-> 17.5 Tmax 99.6 ID of abdominal wall abscess performed twice Blood cultures 1 out of 3 positive for Enterobacter cloaca  Begin cefepime 12/17 CXR, UA ordered  Trend Completed 7 days of ceftriaxone during last hospitalization due to aspiration pna Transaminitis 176/286 -> 123/208-> 427/354 -> 189/302-> 110/216 Trend, statin stopped 12/17  Hospital day # 6   Patient seen and examined by NP/APP with MD. MD to update note as needed.   Cortney E Ernestina Columbia , MSN, AGACNP-BC [facility] See Amion for schedule and pager information 01/20/2023 1:13 PM  I have personally obtained history,examined this patient, reviewed notes, independently viewed imaging studies, participated in medical decision making and plan of care.ROS completed by me personally and pertinent positives fully documented  I have made any additions or clarifications directly to the above note. Agree with note  above.  Patient remained febrile with increasing white count despite I&D drainage and being on antibiotics for now for several days.  Will ask medical hospitalist team to consult and help with management and resume care.  I called patient's daughter Gilford Raid and left a message for her to call me back.  Await sensitivity of Enterobacter cloak he to decide if changing antibiotic to maxipeme is necessary.  Greater than 50% time during this 35-minute visit was spent in counseling and coordination of care and discussion patient and care team and answered questions.  Delia Heady, MD Medical Director [facility] Pager: [phone] 01/20/2023 2:07 PM   To contact Stroke Continuity provider, please refer to [email or web address]. After hours, contact General Neurology

## 2023-01-20 NOTE — TOC Progression Note (Signed)
Transition of Care [facility]) - Progression Note    Patient Details  Name: Jocelyn [name] MRN: [id number] Date of Birth: [birth year]  [facility] CM/SW Contact  Erin Sons, [name] Phone Number: 01/20/2023, 1:13 PM  Clinical Narrative:     CSW called and spoke with pt's daughter Ladean Raya. She is interested in SNF at [facility] if possible. If they cannot offer bed for pt, then she would be fine with pt returning to [facility]. CSW agreed to send referral. Daughter also requesting to speak with attending; CSW agreed to notify.   Fl2 completed and bed requests sent in hub. CSW notified attending via secure chat of daughter request.   Expected Discharge Plan: Skilled Nursing Facility    Expected Discharge Plan and Services                                               Social Determinants of Health (SDOH) Interventions SDOH Screenings   Food Insecurity: Patient Unable To Answer (01/14/2023)  Housing: Patient Unable To Answer (01/14/2023)  Transportation Needs: Unknown (01/14/2023)  Utilities: Patient Unable To Answer (01/14/2023)  Alcohol Screen: Low Risk  (12/03/2018)  Depression (PHQ2-9): Low Risk  (12/06/2022)  Financial Resource Strain: Medium Risk (07/26/2018)  Physical Activity: Inactive (07/26/2018)  Social Connections: Moderately Isolated (07/26/2018)  Stress: No Stress Concern Present (07/26/2018)  Tobacco Use: Low Risk  (01/19/2023)    Readmission Risk Interventions     No data to display

## 2023-01-20 NOTE — Progress Notes (Signed)
Occupational Therapy Treatment Patient Details Name: Jocelyn [name] MRN: [id number] DOB: 08/09/1931 Today's Date: 01/20/2023   History of present illness Pt is an 89 y.o. female who presented to the ED 12/13 due to LUE weakness, left facial droop and CTH showed development of scattered SAH. PEG replaced 12/16. Recent hospitalization 11/27-12/11 due to R MCA ischemic stroke for which she underwent IR thrombectomy and PEG placement.  She was discharged to [facility] 12/11. PMH: HTN, CHF, a fib with RVR on eliquis, HLD, OSA   OT comments  Upon arrival, pt supine in bed and receiving medication from RN. Pt agreeable to therapy. Facilitating PROM of L hand, wrist, and elbow. Pt requiring Max-Total A for bed mobility. Focused session on sitting balance and use of mirror to increase awareness and correct sitting posture to neutral position. Cueing patient to "give therapist a hug" with RUE, and pt initating pulling herself into neutral sitting position while holding therapist's waist. Pt completing 5x. Returned to supine per patient request. Continue to recommend post-acute rehab. Will continue to follow acutely as admitted.        If plan is discharge home, recommend the following:  A lot of help with walking and/or transfers;Two people to help with walking and/or transfers;A lot of help with bathing/dressing/bathroom;Two people to help with bathing/dressing/bathroom;Assistance with cooking/housework;Assistance with feeding;Direct supervision/assist for medications management;Direct supervision/assist for financial management;Assist for transportation;Help with stairs or ramp for entrance   Equipment Recommendations  None recommended by OT    Recommendations for Other Services Speech consult    Precautions / Restrictions Precautions Precautions: Fall Precaution Comments: PEG tube, abd binder Restrictions Weight Bearing Restrictions Per Provider Order: No       Mobility Bed Mobility Overal bed  mobility: Needs Assistance Bed Mobility: Sit to Supine, Rolling, Sidelying to Sit Rolling: Max assist Sidelying to sit: Max assist   Sit to supine: Total assist   General bed mobility comments: Max-Total A for managing LEs and trunk. Pt using hand rail with LUE to pull and roll during supine>sit.    Transfers                         Balance Overall balance assessment: Needs assistance Sitting-balance support: Bilateral upper extremity supported, Feet supported Sitting balance-Leahy Scale: Poor                                     ADL either performed or assessed with clinical judgement   ADL Overall ADL's : Needs assistance/impaired                                       General ADL Comments: Focused session on sitting balance.    Extremity/Trunk Assessment Upper Extremity Assessment RUE Deficits / Details: overall WFL, generally weak LUE Deficits / Details: no activation noted from wrist to shoulder. Minimal gross flexion noted at digits, denies sensation changes but not responsive to nailbed pressure. L inattention. No functional use. LUE Sensation: decreased light touch;decreased proprioception LUE Coordination: decreased fine motor;decreased gross motor   Lower Extremity Assessment RLE Deficits / Details: Pt with c/o arthritic knee pain bilat, R>L LLE Deficits / Details: grossly 3/5 hip/knee        Vision   Vision Assessment?: Vision impaired- to be further tested in functional context  Perception Perception Perception: Impaired Preception Impairment Details: Inattention/Neglect   Praxis Praxis Praxis: Impaired    Cognition Arousal: Alert Behavior During Therapy: Flat affect Overall Cognitive Status: Impaired/Different from baseline Area of Impairment: Orientation, Attention, Following commands, Memory, Safety/judgement, Awareness, Problem solving                 Orientation Level: Disoriented to,  Situation Current Attention Level: Focused Memory: Decreased recall of precautions, Decreased short-term memory Following Commands: Follows one step commands inconsistently Safety/Judgement: Decreased awareness of safety, Decreased awareness of deficits Awareness: Intellectual Problem Solving: Slow processing, Decreased initiation, Difficulty sequencing General Comments: Pt with decreased attention and awareness. Pt able to engage in correcting sitting balance with use of mirror.        Exercises Exercises: Other exercises Other Exercises Other Exercises: PROM of L hand, wrist, and elbow; 10x; supine Other Exercises: Use of mirror to addressing sitting balance at EOB. Cueing patient to "give therapist hug" with RUE, and pt initating pulling herself into neurtral sitting position while holdign therapist waist. peforming 5x. Each time patient gaave therapit a hug, she would state "amen" and smile.    Shoulder Instructions       General Comments pt with BM at end of session, NT aware    Pertinent Vitals/ Pain       Pain Assessment Pain Assessment: Faces Faces Pain Scale: Hurts even more Pain Location: bilateral knees and bottom/back Pain Descriptors / Indicators: Grimacing, Discomfort Pain Intervention(s): Monitored during session, Limited activity within patient's tolerance, Repositioned, RN gave pain meds during session  Home Living                                          Prior Functioning/Environment              Frequency  Min 1X/week        Progress Toward Goals  OT Goals(current goals can now be found in the care plan section)  Progress towards OT goals: Progressing toward goals  Acute Rehab OT Goals OT Goal Formulation: Patient unable to participate in goal setting Time For Goal Achievement: 01/31/23 Potential to Achieve Goals: Fair ADL Goals Pt Will Perform Grooming: with set-up;sitting Pt Will Perform Upper Body Dressing: with min  assist;sitting Pt Will Perform Lower Body Dressing: with max assist;sit to/from stand Pt Will Transfer to Toilet: with max assist;stand pivot transfer;bedside commode Additional ADL Goal #1: Pt will follow simple 1 step command 80% of attempts as a precursor to ADLs Additional ADL Goal #2: pt will sustain visual attention in R visual field for 50% of session Additional ADL Goal #3: pt will have stable vitals with static eob sitting max (A)  Plan      Co-evaluation                 AM-PAC OT "6 Clicks" Daily Activity     Outcome Measure   Help from another person eating meals?: Total Help from another person taking care of personal grooming?: A Lot Help from another person toileting, which includes using toliet, bedpan, or urinal?: Total Help from another person bathing (including washing, rinsing, drying)?: A Lot Help from another person to put on and taking off regular upper body clothing?: A Lot Help from another person to put on and taking off regular lower body clothing?: A Lot 6 Click Score: 10    End of Session  OT Visit Diagnosis: Unsteadiness on feet (R26.81);Muscle weakness (generalized) (M62.81)   Activity Tolerance Patient tolerated treatment well;Patient limited by fatigue   Patient Left in bed;with call bell/phone within reach;with bed alarm set   Nurse Communication Mobility status        Time: 7846-9629 OT Time Calculation (min): 22 min  Charges: OT General Charges $OT Visit: 1 Visit OT Treatments $Therapeutic Activity: 8-22 mins  Edan Serratore MSOT, OTR/L Acute Rehab Office: [phone]  Theodoro Grist Kayti Poss 01/20/2023, 12:03 PM

## 2023-01-20 NOTE — Plan of Care (Signed)
  Problem: Education: Goal: Knowledge of General Education information will improve Description: Including pain rating scale, medication(s)/side effects and non-pharmacologic comfort measures Outcome: Progressing   Problem: Health Behavior/Discharge Planning: Goal: Ability to manage health-related needs will improve Outcome: Progressing   Problem: Clinical Measurements: Goal: Ability to maintain clinical measurements within normal limits will improve Outcome: Progressing Goal: Will remain free from infection Outcome: Progressing Goal: Diagnostic test results will improve Outcome: Progressing Goal: Respiratory complications will improve Outcome: Progressing Goal: Cardiovascular complication will be avoided Outcome: Progressing   Problem: Activity: Goal: Risk for activity intolerance will decrease Outcome: Progressing   Problem: Nutrition: Goal: Adequate nutrition will be maintained Outcome: Progressing   Problem: Coping: Goal: Level of anxiety will decrease Outcome: Progressing   Problem: Elimination: Goal: Will not experience complications related to bowel motility Outcome: Progressing Goal: Will not experience complications related to urinary retention Outcome: Progressing   Problem: Pain Management: Goal: General experience of comfort will improve Outcome: Progressing   Problem: Safety: Goal: Ability to remain free from injury will improve Outcome: Progressing   Problem: Skin Integrity: Goal: Risk for impaired skin integrity will decrease Outcome: Progressing   Problem: Education: Goal: Ability to describe self-care measures that may prevent or decrease complications (Diabetes Survival Skills Education) will improve Outcome: Progressing Goal: Individualized Educational Video(s) Outcome: Progressing   Problem: Coping: Goal: Ability to adjust to condition or change in health will improve Outcome: Progressing   Problem: Fluid Volume: Goal: Ability to  maintain a balanced intake and output will improve Outcome: Progressing   Problem: Health Behavior/Discharge Planning: Goal: Ability to identify and utilize available resources and services will improve Outcome: Progressing Goal: Ability to manage health-related needs will improve Outcome: Progressing   Problem: Metabolic: Goal: Ability to maintain appropriate glucose levels will improve Outcome: Progressing   Problem: Nutritional: Goal: Maintenance of adequate nutrition will improve Outcome: Progressing Goal: Progress toward achieving an optimal weight will improve Outcome: Progressing   Problem: Skin Integrity: Goal: Risk for impaired skin integrity will decrease Outcome: Progressing   Problem: Tissue Perfusion: Goal: Adequacy of tissue perfusion will improve Outcome: Progressing   Problem: Education: Goal: Knowledge of disease or condition will improve Outcome: Progressing Goal: Knowledge of secondary prevention will improve (MUST DOCUMENT ALL) Outcome: Progressing Goal: Knowledge of patient specific risk factors will improve Loraine Leriche N/A or DELETE if not current risk factor) Outcome: Progressing   Problem: Intracerebral Hemorrhage Tissue Perfusion: Goal: Complications of Intracerebral Hemorrhage will be minimized Outcome: Progressing   Problem: Coping: Goal: Will verbalize positive feelings about self Outcome: Progressing Goal: Will identify appropriate support needs Outcome: Progressing   Problem: Health Behavior/Discharge Planning: Goal: Ability to manage health-related needs will improve Outcome: Progressing Goal: Goals will be collaboratively established with patient/family Outcome: Progressing   Problem: Self-Care: Goal: Ability to participate in self-care as condition permits will improve Outcome: Progressing Goal: Verbalization of feelings and concerns over difficulty with self-care will improve Outcome: Progressing Goal: Ability to communicate needs  accurately will improve Outcome: Progressing   Problem: Nutrition: Goal: Risk of aspiration will decrease Outcome: Progressing Goal: Dietary intake will improve Outcome: Progressing

## 2023-01-20 NOTE — Progress Notes (Signed)
[facility] Progress Note     Subjective: CC-  Resting comfortably, denies pain.  S/p bedside I&D of abdominal wall yesterday Per night RN patient had 3 loose BMs on night shift WBC 17 from 14, TMAX 101.1 yesterday morning at 0744   Objective: Vital signs in last 24 hours: Temp:  [97.7 F (36.5 C)-99.5 F (37.5 C)] 97.7 F (36.5 C) (12/20 0736) Pulse Rate:  [66-108] 93 (12/20 0736) Resp:  [17-18] 17 (12/20 0736) BP: (121-150)/(57-65) 121/65 (12/20 0736) SpO2:  [53 %-100 %] 100 % (12/20 0736) Weight:  [67.9 kg] 67.9 kg (12/19 1300) Last BM Date : 01/19/23  Intake/Output from previous day: 12/19 0701 - 12/20 0700 In: 3821.4 [P.O.:263; NG/GT:3558.4] Out: 300 [Urine:300] Intake/Output this shift: No intake/output data recorded.  PE: Gen:  Alert, NAD, pleasant Pulm: normal work of breathing on room air Abd: soft, ND, +BS, PEG site with some purulent drainage on dressing, LLQ I&D site with penrose drain present and scant SS fluid on dressing, there is now a second I&D site closer to the PEG tude that is draining a moderate amount of purulent drainage. There is come cellulitis present without palpable residual fluctuance. Applying pressure between the peg tube and the more medial drain elicits purulence   Lab Results:  Recent Labs    01/19/23 0613 01/20/23 0401  WBC 14.6* 17.5*  HGB 9.1* 8.7*  HCT 28.0* 26.3*  PLT 285 271   BMET Recent Labs    01/19/23 0613 01/20/23 0401  NA 134* 133*  K 4.7 4.5  CL 110 108  CO2 20* 19*  GLUCOSE 136* 150*  BUN 27* 21  CREATININE 0.51 0.70  CALCIUM 8.2* 8.5*   PT/INR No results for input(s): "LABPROT", "INR" in the last 72 hours. CMP     Component Value Date/Time   NA 133 (L) 01/20/2023 0401   NA 143 12/07/2022 1535   NA 141 04/13/2011 1125   K 4.5 01/20/2023 0401   K 3.4 (L) 04/13/2011 1125   CL 108 01/20/2023 0401   CL 105 04/13/2011 1125   CO2 19 (L) 01/20/2023 0401   CO2 29 04/13/2011 1125   GLUCOSE  150 (H) 01/20/2023 0401   GLUCOSE 111 (H) 04/13/2011 1125   BUN 21 01/20/2023 0401   BUN 16 12/07/2022 1535   BUN 14 04/13/2011 1125   CREATININE 0.70 01/20/2023 0401   CREATININE 0.80 08/01/2018 1544   CALCIUM 8.5 (L) 01/20/2023 0401   CALCIUM 9.2 04/13/2011 1125   PROT 6.2 (L) 01/20/2023 0401   PROT 7.6 12/07/2022 1535   PROT 7.8 04/13/2011 1125   ALBUMIN 1.8 (L) 01/20/2023 0401   ALBUMIN 4.4 12/07/2022 1535   ALBUMIN 4.0 04/13/2011 1125   AST 110 (H) 01/20/2023 0401   AST 24 04/13/2011 1125   ALT 216 (H) 01/20/2023 0401   ALT 20 04/13/2011 1125   ALKPHOS 88 01/20/2023 0401   ALKPHOS 51 04/13/2011 1125   BILITOT 0.6 01/20/2023 0401   BILITOT 0.8 12/07/2022 1535   BILITOT 0.6 04/13/2011 1125   GFRNONAA >60 01/20/2023 0401   GFRNONAA 67 08/01/2018 1544   GFRAA 78 08/01/2018 1544   Lipase  No results found for: "LIPASE"     Studies/Results: DG ABDOMEN PEG TUBE LOCATION Result Date: 01/18/2023 CLINICAL DATA:  Leaking PEG tube. Gastrostomy tube replacement 2 days ago. EXAM: ABDOMEN - 1 VIEW COMPARISON:  Radiographs 01/15/2023 and 12/30/2022.  CT 01/16/2023. FINDINGS: 1159 hours. Single-view of the mid to lower abdomen was obtained after  contrast (30 cc Gastrografin) was injected via the PEG tube. The contrast is within the lumen of the stomach. No extravasated enteric contrast identified. There is contrast material within the rectosigmoid colon from previous administration. There is a nonobstructive bowel gas pattern. Soft tissue emphysema within the anterior abdominal wall appears improved from recent CT. Foreign body overlying the left lateral abdominal wall is potentially a surgical drain or sponge. Correlate clinically. IMPRESSION: 1. No evidence of extravasated enteric contrast. Tip of the G-tube is within the gastric lumen. 2. Nonobstructive bowel gas pattern. 3. Improved soft tissue emphysema within the anterior abdominal wall. Possible surgical drain or sponge overlying the  left lateral abdominal wall. Correlate clinically. Electronically Signed   By: Carey Bullocks M.D.   On: 01/18/2023 13:48    Anti-infectives: Anti-infectives (From admission, onward)    Start     Dose/Rate Route Frequency Ordered Stop   01/17/23 1130  ceFEPIme (MAXIPIME) 2 g in sodium chloride 0.9 % 100 mL IVPB        2 g 200 mL/hr over 30 Minutes Intravenous Every 12 hours 01/17/23 1038 01/24/23 0959   01/16/23 0945  cefTRIAXone (ROCEPHIN) 2 g in sodium chloride 0.9 % 100 mL IVPB  Status:  Discontinued        2 g 200 mL/hr over 30 Minutes Intravenous Every 24 hours 01/16/23 0931 01/17/23 1038        Assessment/Plan Abdominal wall abscess -S/p PEG placement 12/9 Dr. Bedelia Person. PEG dislodged, tract maintained, able to get a foley into the tract. Exchanged for IR PEG 12/16.   -S/p bedside I&D of abdominal wall with penrose drain placement 12/16 by Dr. Janee Morn. S/p bedside I&D of abdominal wall with penrose placement 12/19 by Carlena Bjornstad, PA-C - Tolerating tube feedings at goal. Xray 12/18 confirmed PEG in good location without extravasation. - Cx NGTD - cellulitis remains, WBC continues to increase. Continue IV abx for cellulitis and allow abdominal wall to continue draining. Consider other sources of leukocytosis as well - stop senna given diarrhea. Diarrhea not unexpected given tube feeds. Will start low dose banatrol and add destin to protect her skin.     FEN - D1 diet, tube feeds at goal VTE - SCD's, sq heparin, DOAC held in the setting of ICH ID - Enterobacter Bacteremia 12/15,  cefepime 12/17>>    Transaminitis - alk phos and bilirubin are WNL; gallbladder is surgically absent  R SAH  Cerebral aneurysm  HTN emergency Hx ischemic R MCA stroke  A.fib  HLD ODA Osteoarthritis   I reviewed last 24 h vitals and pain scores, last 48 h intake and output, last 24 h labs and trends, and last 24 h imaging results.    LOS: 6 days    Adam Phenix, [facility]  Surgery 01/20/2023, 7:58 AM Please see Amion for pager number during day hours 7:00am-4:30pm

## 2023-01-20 NOTE — Progress Notes (Signed)
Physical Therapy Treatment Patient Details Name: Jocelyn [name] MRN: [id number] DOB: 16-May-1931 Today's Date: 01/20/2023   History of Present Illness Pt is an 89 y.o. female who presented to the ED 12/13 due to LUE weakness, left facial droop and CTH showed development of scattered SAH. 12/15 dislodged PEG tube. 12/16 incision and drainage of left abdominal wall infection PEG replaced. 12/19 additional I&D of abdominal wall performed. Recent hospitalization 11/27-12/11 due to R MCA ischemic stroke for which she underwent IR thrombectomy and PEG placement.  She was discharged to [facility] 12/11. PMH: HTN, CHF, a fib with RVR on eliquis, HLD, OSA    PT Comments  Agreeable to work with PT this afternoon. Reviewed bed mobility, techniques for more comfortable transitions which went better today. Mod assist to roll, max A to rise and lower back into bed. Still demonstrating strong push tendency towards Rt. Worked on awareness, midline control, and use of RUE to compensate by holding footboard. Attempted to rise from EOB using Stedy several times and although she is unable to fully clear her buttocks with total assist, pt did demonstrate good engagement of LEs and trunk to lean forward with cues. Reviewed LE exercises to perform in bed between session. Denies abdominal pain but does have knee pain with movement (baseline.) Floated heels due to LE tone, pushing heels into bed. Notified RN, recommend prevalon boots for protection. Patient will continue to benefit from skilled physical therapy services to further improve independence with functional mobility.     If plan is discharge home, recommend the following: Two people to help with walking and/or transfers;Direct supervision/assist for medications management;Assist for transportation;Two people to help with bathing/dressing/bathroom;Assistance with cooking/housework;Assistance with feeding;Direct supervision/assist for financial management;Help with stairs or  ramp for entrance;Supervision due to cognitive status   Can travel by private vehicle     No  Equipment Recommendations  None recommended by PT    Recommendations for Other Services       Precautions / Restrictions Precautions Precautions: Fall Precaution Comments: PEG tube, abd binder Restrictions Weight Bearing Restrictions Per Provider Order: No     Mobility  Bed Mobility Overal bed mobility: Needs Assistance Bed Mobility: Rolling, Sidelying to Sit, Sit to Sidelying Rolling: Mod assist, Used rails Sidelying to sit: Max assist, HOB elevated     Sit to sidelying: Max assist, HOB elevated, Used rails General bed mobility comments: Mod assist to roll to Lt side with hand over hand guidance to grasp rail. Cues to pull and assisted LEs and trunk to side of bed. Max assist to rise. Demonstrates pushing towards left side. Able to compensate and hold midline on EOB >75% of the time when grasping footboard with RUE. Max assist for LEs back into bed, slight guidance of trunk onto side.    Transfers Overall transfer level: Needs assistance Equipment used: Ambulation equipment used Transfers: Sit to/from Stand Sit to Stand: From elevated surface, Via lift equipment, Total assist           General transfer comment: Attempted to rise several times with elevated bed surface and use of Stedy. Pulls through RUE at times but still demonstrates sporadic pushing tendency towards Lt side. Unable to full clear buttocks with total assist but did demonstrate appropriate muscle activation 3x. max cues for alignment and forward weight shift. Transfer via Lift Equipment: Stedy  Ambulation/Gait                   Stairs  Wheelchair Mobility     Tilt Bed    Modified Rankin (Stroke Patients Only) Modified Rankin (Stroke Patients Only) Pre-Morbid Rankin Score: Severe disability Modified Rankin: Severe disability     Balance Overall balance assessment: Needs  assistance Sitting-balance support: Feet supported, Single extremity supported Sitting balance-Leahy Scale: Poor Sitting balance - Comments: Progressed to CGA with single hand support on footboard. Postural control: Left lateral lean                                  Cognition Arousal: Alert Behavior During Therapy: Flat affect Overall Cognitive Status: No family/caregiver present to determine baseline cognitive functioning                                          Exercises General Exercises - Lower Extremity Ankle Circles/Pumps: AROM, Both, 10 reps, Supine Quad Sets: Strengthening, Both, 10 reps, Supine    General Comments General comments (skin integrity, edema, etc.): HR to 122 while working at max. Pt dyspneic. Spen majority of session EOB working on midline control, developing awareness and correcting lean.      Pertinent Vitals/Pain Pain Assessment Pain Assessment: Faces Faces Pain Scale: Hurts little more Pain Location: bilateral knees Pain Descriptors / Indicators: Grimacing, Discomfort Pain Intervention(s): Monitored during session, Repositioned    Home Living                          Prior Function            PT Goals (current goals can now be found in the care plan section) Acute Rehab PT Goals Patient Stated Goal: not stated PT Goal Formulation: With patient/family Time For Goal Achievement: 01/29/23 Potential to Achieve Goals: Fair Progress towards PT goals: Progressing toward goals    Frequency    Min 1X/week      PT Plan      Co-evaluation              AM-PAC PT "6 Clicks" Mobility   Outcome Measure  Help needed turning from your back to your side while in a flat bed without using bedrails?: A Lot Help needed moving from lying on your back to sitting on the side of a flat bed without using bedrails?: Total Help needed moving to and from a bed to a chair (including a wheelchair)?: Total Help  needed standing up from a chair using your arms (e.g., wheelchair or bedside chair)?: Total Help needed to walk in hospital room?: Total Help needed climbing 3-5 steps with a railing? : Total 6 Click Score: 7    End of Session Equipment Utilized During Treatment: Gait belt Activity Tolerance: Patient tolerated treatment well (weakness) Patient left: with call bell/phone within reach;with bed alarm set;in bed (Heels floating HOB >30 deg) Nurse Communication: Mobility status (Would benefit from prevalon boots) PT Visit Diagnosis: Other abnormalities of gait and mobility (R26.89);Hemiplegia and hemiparesis;Other symptoms and signs involving the nervous system (R29.898) Hemiplegia - Right/Left: Left Hemiplegia - dominant/non-dominant: Non-dominant Hemiplegia - caused by: Nontraumatic intracerebral hemorrhage     Time: 4010-2725 PT Time Calculation (min) (ACUTE ONLY): 24 min  Charges:    $Therapeutic Activity: 8-22 mins $Neuromuscular Re-education: 8-22 mins PT General Charges $$ ACUTE PT VISIT: 1 Visit  Jocelyn [name], PT, DPT [facility]  Rehabilitation Services Physical Therapist Office: [phone] Website: [email or web address]    Berton Mount 01/20/2023, 4:30 PM

## 2023-01-20 NOTE — NC FL2 (Signed)
[place] MEDICAID FL2 LEVEL OF CARE FORM     IDENTIFICATION  Patient Name: Jocelyn [name] Birthdate: May 24, 1931 Sex: female Admission Date (Current Location): 01/14/2023  [facility] and [place] Number:  Producer, television/film/video and Address:  [facility], [address], [place], [name] [address]      Provider Number: [id number]  Attending Physician Name and Address:  Stroke, Md, MD  Relative Name and Phone Number:  Ezequiel Kayser (Daughter)  [phone] [facility])    Current Level of Care: Hospital Recommended Level of Care: Skilled Nursing Facility Prior Approval Number:    Date Approved/Denied:   PASRR Number: [id number]  Discharge Plan: SNF    Current Diagnoses: Patient Active Problem List   Diagnosis Date Noted   Malnutrition of moderate degree 01/16/2023   ICH (intracerebral hemorrhage) (HCC) 01/14/2023   Acute ischemic stroke (HCC) 12/29/2022   Coronary artery disease of native artery of native heart with stable angina pectoris (HCC) 04/21/2022   Coronary artery calcification 10/27/2021   Aortic atherosclerosis (HCC) 10/27/2021   Lumbar facet arthropathy 03/22/2018   SI joint arthritis (HCC) 03/22/2018   Chronic pain of both knees 03/22/2018   Chronic pain syndrome 03/22/2018   Right sided colitis 12/30/2017   Impaired fasting glucose 02/06/2017   Carpal tunnel syndrome on right 09/05/2015   Gastroesophageal reflux disease without esophagitis 09/05/2015   Hx of iron deficiency anemia 09/02/2015   OSA on CPAP 08/01/2015   Bilateral primary osteoarthritis of knee 08/01/2015   Controlled substance agreement signed 07/08/2015   Essential hypertension 07/28/2014   Lumbar degenerative disc disease 07/28/2014   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   Stable angina (HCC) 07/10/2013   Hyperlipidemia LDL goal <70 07/10/2013   Cerebrovascular accident (CVA) (HCC) 07/10/2013   Dyspnea on exertion 07/10/2013   Hearing loss 01/29/2009   Arthritis  sicca 05/23/2006    Orientation RESPIRATION BLADDER Height & Weight     Self, Situation, Place  Normal Incontinent Weight: 149 lb 11.1 oz (67.9 kg) Height:  5\' 3"  (160 cm)  BEHAVIORAL SYMPTOMS/MOOD NEUROLOGICAL BOWEL NUTRITION STATUS      Incontinent Feeding tube (Osmolite 1.2 @ 55 mL/hr)  AMBULATORY STATUS COMMUNICATION OF NEEDS Skin   Extensive Assist Verbally                         Personal Care Assistance Level of Assistance  Bathing, Feeding, Dressing Bathing Assistance: Maximum assistance Feeding assistance: Maximum assistance Dressing Assistance: Maximum assistance     Functional Limitations Info  Sight, Hearing, Speech Sight Info: Adequate Hearing Info: Adequate Speech Info: Impaired    SPECIAL CARE FACTORS FREQUENCY  OT (By licensed OT), PT (By licensed PT)     PT Frequency: 5x/week OT Frequency: 5x/week     Speech Therapy Frequency: 5x/week      Contractures Contractures Info: Not present    Additional Factors Info  Code Status, Allergies Code Status Info: full code Allergies Info: NKA           Current Medications (01/20/2023):  This is the current hospital active medication list Current Facility-Administered Medications  Medication Dose Route Frequency Provider Last Rate Last Admin    stroke: early stages of recovery book   Does not apply Once Caryl Pina, MD       acetaminophen (TYLENOL) tablet 650 mg  650 mg Oral Q4H PRN Caryl Pina, MD       Or   acetaminophen (TYLENOL) 160 MG/5ML solution 650  mg  650 mg Per Tube Q4H PRN Caryl Pina, MD   650 mg at 01/15/23 1332   Or   acetaminophen (TYLENOL) suppository 650 mg  650 mg Rectal Q4H PRN Caryl Pina, MD       amLODipine (NORVASC) tablet 5 mg  5 mg Per Tube Daily Elmer Picker, NP   5 mg at 01/20/23 0930   bupivacaine liposome (EXPAREL) 1.3 % injection 266 mg  20 mL Infiltration Once Diamantina Monks, MD       calcium-vitamin D (OSCAL WITH D) 500-5 MG-MCG per tablet 1 tablet  1  tablet Oral BID Micki Riley, MD   1 tablet at 01/20/23 0930   ceFEPIme (MAXIPIME) 2 g in sodium chloride 0.9 % 100 mL IVPB  2 g Intravenous Q12H Elmer Picker, NP 200 mL/hr at 01/20/23 0941 2 g at 01/20/23 0941   Chlorhexidine Gluconate Cloth 2 % PADS 6 each  6 each Topical Daily Caryl Pina, MD   6 each at 01/20/23 0947   famotidine (PEPCID) tablet 20 mg  20 mg Oral BID Micki Riley, MD   20 mg at 01/20/23 0930   feeding supplement (OSMOLITE 1.2 CAL) liquid 1,000 mL  1,000 mL Per Tube Continuous Caryl Pina, MD 55 mL/hr at 01/20/23 0622 1,000 mL at 01/20/23 0622   ferrous sulfate (FER-IN-SOL) 75 (15 Fe) MG/ML solution 65 mg of iron  65 mg of iron Oral Daily Micki Riley, MD   65 mg of iron at 01/20/23 0930   fiber supplement (BANATROL TF) liquid 60 mL  60 mL Per Tube BID Adam Phenix, PA-C   60 mL at 01/20/23 0930   free water 100 mL  100 mL Per Tube Q4H Harolyn Rutherford, MD   100 mL at 01/20/23 1223   heparin injection 5,000 Units  5,000 Units Subcutaneous Q8H de Saintclair Halsted, Cortney E, NP   5,000 Units at 01/20/23 1258   HYDROcodone-acetaminophen (NORCO/VICODIN) 5-325 MG per tablet 1 tablet  1 tablet Oral Q6H PRN Micki Riley, MD   1 tablet at 01/18/23 2151   insulin aspart (novoLOG) injection 0-15 Units  0-15 Units Subcutaneous Q4H Elmer Picker, NP   3 Units at 01/20/23 1257   ipratropium-albuterol (DUONEB) 0.5-2.5 (3) MG/3ML nebulizer solution 3 mL  3 mL Nebulization Q6H PRN Caryl Pina, MD       lidocaine (LIDODERM) 5 % 2 patch  2 patch Transdermal Daily Elmer Picker, NP   2 patch at 01/20/23 0929   liver oil-zinc oxide (DESITIN) 40 % ointment   Topical TID Adam Phenix, PA-C   Given at 01/20/23 1258   Oral care mouth rinse  15 mL Mouth Rinse 4 times per day Caryl Pina, MD   15 mL at 01/20/23 1224   Oral care mouth rinse  15 mL Mouth Rinse PRN Caryl Pina, MD         Discharge Medications: Please see discharge summary for a list of discharge  medications.  Relevant Imaging Results:  Relevant Lab Results:   Additional Information SS#: [id number]  Erin Sons, LCSW

## 2023-01-21 DIAGNOSIS — I619 Nontraumatic intracerebral hemorrhage, unspecified: Secondary | ICD-10-CM | POA: Diagnosis not present

## 2023-01-21 LAB — COMPREHENSIVE METABOLIC PANEL
ALT: 162 U/L — ABNORMAL HIGH (ref 0–44)
AST: 73 U/L — ABNORMAL HIGH (ref 15–41)
Albumin: 1.9 g/dL — ABNORMAL LOW (ref 3.5–5.0)
Alkaline Phosphatase: 83 U/L (ref 38–126)
Anion gap: 9 (ref 5–15)
BUN: 26 mg/dL — ABNORMAL HIGH (ref 8–23)
CO2: 19 mmol/L — ABNORMAL LOW (ref 22–32)
Calcium: 8.5 mg/dL — ABNORMAL LOW (ref 8.9–10.3)
Chloride: 107 mmol/L (ref 98–111)
Creatinine, Ser: 0.78 mg/dL (ref 0.44–1.00)
GFR, Estimated: >60 mL/min (ref >60)
Glucose, Bld: 137 mg/dL — ABNORMAL HIGH (ref 70–99)
Potassium: 4.7 mmol/L (ref 3.5–5.1)
Sodium: 135 mmol/L (ref 135–145)
Total Bilirubin: 0.5 mg/dL (ref <1.2)
Total Protein: 6.3 g/dL — ABNORMAL LOW (ref 6.5–8.1)

## 2023-01-21 LAB — CBC
HCT: 28 % — ABNORMAL LOW (ref 36.0–46.0)
Hemoglobin: 9.1 g/dL — ABNORMAL LOW (ref 12.0–15.0)
MCH: 27.4 pg (ref 26.0–34.0)
MCHC: 32.5 g/dL (ref 30.0–36.0)
MCV: 84.3 fL (ref 80.0–100.0)
Platelets: 214 10*3/uL (ref 150–400)
RBC: 3.32 MIL/uL — ABNORMAL LOW (ref 3.87–5.11)
RDW: 14.6 % (ref 11.5–15.5)
WBC: 17.2 10*3/uL — ABNORMAL HIGH (ref 4.0–10.5)
nRBC: 0.2 % (ref 0.0–0.2)

## 2023-01-21 LAB — GLUCOSE, CAPILLARY
Glucose-Capillary: 133 mg/dL — ABNORMAL HIGH (ref 70–99)
Glucose-Capillary: 143 mg/dL — ABNORMAL HIGH (ref 70–99)
Glucose-Capillary: 125 mg/dL — ABNORMAL HIGH (ref 70–99)
Glucose-Capillary: 121 mg/dL — ABNORMAL HIGH (ref 70–99)
Glucose-Capillary: 120 mg/dL — ABNORMAL HIGH (ref 70–99)

## 2023-01-21 MED ORDER — INSULIN ASPART 100 UNIT/ML IJ SOLN
0.0000 [IU] | Freq: Three times a day (TID) | INTRAMUSCULAR | Status: DC
  Administered 2023-01-21 – 2023-01-23 (×6): 2 [IU] via SUBCUTANEOUS
  Administered 2023-01-24 (×2): 3 [IU] via SUBCUTANEOUS
  Administered 2023-01-24 – 2023-01-25 (×2): 2 [IU] via SUBCUTANEOUS
  Administered 2023-01-25 – 2023-01-26 (×3): 3 [IU] via SUBCUTANEOUS
  Administered 2023-01-26 – 2023-01-27 (×2): 2 [IU] via SUBCUTANEOUS
  Administered 2023-01-27: 5 [IU] via SUBCUTANEOUS
  Administered 2023-01-28: 2 [IU] via SUBCUTANEOUS
  Administered 2023-01-28: 3 [IU] via SUBCUTANEOUS
  Administered 2023-01-28 – 2023-01-29 (×4): 2 [IU] via SUBCUTANEOUS
  Administered 2023-01-30: 3 [IU] via SUBCUTANEOUS
  Administered 2023-01-30 – 2023-01-31 (×3): 2 [IU] via SUBCUTANEOUS
  Administered 2023-02-01: 3 [IU] via SUBCUTANEOUS
  Administered 2023-02-01: 2 [IU] via SUBCUTANEOUS
  Administered 2023-02-01: 3 [IU] via SUBCUTANEOUS
  Administered 2023-02-02 – 2023-02-07 (×11): 2 [IU] via SUBCUTANEOUS

## 2023-01-21 MED ORDER — AMLODIPINE BESYLATE 5 MG PO TABS
10.0000 mg | ORAL_TABLET | Freq: Every day | ORAL | Status: DC
Start: 2023-01-21 — End: 2023-02-07
  Administered 2023-01-21 – 2023-02-07 (×13): 10 mg
  Filled 2023-01-21 (×18): qty 2

## 2023-01-21 MED ORDER — FREE WATER
200.0000 mL | Status: DC
  Administered 2023-01-21 – 2023-01-26 (×34): 200 mL

## 2023-01-21 NOTE — Progress Notes (Signed)
Jocelyn [name]  ONG:[id number] DOB: 08/29/1931 DOA: 01/14/2023 PCP: Gorden Harms, PA-C    Brief Narrative:  89 year old with a history of OA bilateral knees, HTN, GERD, chronic iron deficiency anemia, HLD, OSA on CPAP, pre-DM, and chronic atrial fibrillation on Eliquis who was discharged from the stroke service at [facility] 12/11 after management of an acute right MCA territory infarct related to her atrial fibrillation.  During that preceding admission she presented with left-sided weakness and was found to have a right A1 and M2 occlusion for which she underwent successful mechanical thrombectomy/revascularization.  She was ultimately discharged to a SNF.  She returned to the ER at [facility] from her rehab facility on 12/14 after developing a new left-sided facial droop and left arm numbness with weakness.  CT head at presentation revealed interval development of scattered subarachnoid hemorrhage in the right sylvian fissure.  Her Eliquis was reversed with Kcentra.  CTa revealed no LVO but atheromatous plaque with outpouchings extending posteriorly and inferiorly from both supraclinoid ICAs possibly representing small aneurysms.  Significant Events:  12/14 Admitted with [facility] 12/15 dislodged PEG tube 12/15 long term EEG w/o evidence of seizure activity  12/16 transferred out of the ICU, incision and drainage of left abdominal wall infection. 12/17 blood cultures positive for Enterobacter cloacae, cefepime started 12/19 additional I&D of abdominal wall performed 12/21 [facility] assumed care of patient  Goals of Care:   Code Status: Full Code   DVT prophylaxis: heparin injection 5,000 Units Start: 01/17/23 1400 SCD's Start: 01/14/23 0129   Interim Hx: An extensive review of the patient's hospital chart has been completed.  She is afebrile.  She is in atrial fibrillation but heart rate is controlled.  Vital signs are otherwise stable.  She appears to be resting comfortably.  No evidence of  respiratory distress.  She will look at examiner but she does not interact.  Assessment & Plan:  Subarachnoid hemorrhage -cerebral aneurysm Noted at time of this readmission - felt to be related to Eliquis therapy -imaging has suggested outpouchings extending posteriorly and inferiorly from both supraclinoid ICAs which could reflect small aneurysms -these were noted on CTa as well as MRa -yearly MRa is recommended in follow-up by the stroke team  Recent CVA Admitted to the hospital 11/28-12/12/2022 for treatment of a right MCA territory infarct felt to be cardioembolic due to A-fib -an acute clot was noted at the right terminal ICA and the patient underwent intervention with subsequent revascularization  Abnormal EEG Routine EEG 12/14 raised the question of cortical dysfunction arising from the right hemisphere but with no evidence of seizure activity -long-term EEG was connected and also revealed no evidence of active seizure activity  Hypertensive emergency Blood pressure presently well-controlled  Chronic atrial fibrillation Eliquis on hold due to [facility]  Transaminitis Statin on hold -LFTs improving  Recent Labs  Lab 01/17/23 0439 01/18/23 0554 01/19/23 0613 01/20/23 0401 01/21/23 0803  AST 427* 332* 189* 110* 73*  ALT 354* 375* 302* 216* 162*  ALKPHOS 104 111 100 88 83  BILITOT 0.9 0.6 0.7 0.6 0.5  PROT 6.5 6.5 6.4* 6.2* 6.3*  ALBUMIN 1.9* 1.9* 1.9* 1.8* 1.9*     HLD LDL 33 - statin discontinued 12/17 due to elevated LFTs  Dysphagia due to stroke PEG tube originally placed 12/9 during prior admission -this became dislodged 12/15 with the tract temporarily preserved using a Foley catheter which was subsequently exchanged for a replacement PEG tube in IR 12/16  LLQ abdominal wall infection General  Surgery has been following with I&D performed 12/16 with Penrose drain placement and again 12/19  Enterobacter bacteremia Noted on 1 of 2 blood cultures drawn 12/15 -  third-generation cephalosporin and Zosyn resistant - remains on IV cefepime  Family Communication: Spoke with daughter at bedside at length Disposition: Will need SNF placement    Objective: Blood pressure (!) 144/59, pulse 98, temperature 97.8 F (36.6 C), temperature source Oral, resp. rate 18, height 5\' 3"  (1.6 m), weight 69 kg, SpO2 100%.  Intake/Output Summary (Last 24 hours) at 01/21/2023 0827 Last data filed at 01/20/2023 2334 Gross per 24 hour  Intake 952 ml  Output 1650 ml  Net -698 ml   Filed Weights   01/19/23 0432 01/19/23 1300 01/21/23 0500  Weight: 67.9 kg 67.9 kg 69 kg    Examination: General: No acute respiratory distress Lungs: Clear to auscultation bilaterally without wheezes or crackles Cardiovascular: Regular rate and rhythm without murmur gallop or rub normal S1 and S2 Abdomen: Abdominal wounds dressed with abdominal binder in place, bowel sounds positive, no rebound Extremities: No significant cyanosis, clubbing, or edema bilateral lower extremities  CBC: Recent Labs  Lab 01/19/23 0613 01/20/23 0401 01/21/23 0803  WBC 14.6* 17.5* 17.2*  HGB 9.1* 8.7* 9.1*  HCT 28.0* 26.3* 28.0*  MCV 83.6 81.7 84.3  PLT 285 271 214   Basic Metabolic Panel: Recent Labs  Lab 01/14/23 1330 01/14/23 1812 01/15/23 0623 01/15/23 0901 01/16/23 1230 01/17/23 0439 01/18/23 0554 01/19/23 0613 01/20/23 0401  NA  --   --  137   < > 138   < > 136 134* 133*  K  --   --  5.2*   < > 4.6   < > 4.8 4.7 4.5  CL  --   --  108   < > 110   < > 111 110 108  CO2  --   --  20*   < > 19*   < > 19* 20* 19*  GLUCOSE  --   --  156*   < > 113*   < > 155* 136* 150*  BUN  --   --  47*   < > 38*   < > 31* 27* 21  CREATININE  --   --  1.24*   < > 0.69   < > 0.79 0.51 0.70  CALCIUM  --   --  8.8*   < > 8.7*   < > 8.2* 8.2* 8.5*  MG 2.4 2.5* 2.5*  --  2.6*  --  2.2  --   --   PHOS 4.0 4.6 4.2  --   --   --   --   --   --    < > = values in this interval not displayed.    GFR: Estimated Creatinine Clearance: 44.4 mL/min (by C-G formula based on SCr of 0.7 mg/dL).   Scheduled Meds:   stroke: early stages of recovery book   Does not apply Once   amLODipine  5 mg Per Tube Daily   bupivacaine liposome  20 mL Infiltration Once   calcium-vitamin D  1 tablet Oral BID   Chlorhexidine Gluconate Cloth  6 each Topical Daily   famotidine  20 mg Oral BID   ferrous sulfate  65 mg of iron Oral Daily   fiber supplement (BANATROL TF)  60 mL Per Tube BID   free water  100 mL Per Tube Q4H   heparin injection (subcutaneous)  5,000 Units Subcutaneous  Q8H   insulin aspart  0-15 Units Subcutaneous Q4H   lidocaine  2 patch Transdermal Daily   liver oil-zinc oxide   Topical TID   mouth rinse  15 mL Mouth Rinse 4 times per day   Continuous Infusions:  ceFEPime (MAXIPIME) IV 2 g (01/20/23 2215)   feeding supplement (OSMOLITE 1.2 CAL) 1,000 mL (01/21/23 0230)     LOS: 7 days   Lonia Blood, MD [facility] Office  [phone] Pager - Text Page per Loretha Stapler  If 7PM-7AM, please contact night-coverage per Amion 01/21/2023, 8:27 AM

## 2023-01-21 NOTE — Plan of Care (Signed)
  Problem: Clinical Measurements: Goal: Ability to maintain clinical measurements within normal limits will improve Outcome: Progressing Goal: Will remain free from infection Outcome: Progressing Goal: Diagnostic test results will improve Outcome: Progressing Goal: Respiratory complications will improve Outcome: Progressing Goal: Cardiovascular complication will be avoided Outcome: Progressing   Problem: Nutrition: Goal: Adequate nutrition will be maintained Outcome: Progressing   Problem: Safety: Goal: Ability to remain free from injury will improve Outcome: Progressing   Problem: Pain Management: Goal: General experience of comfort will improve Outcome: Progressing   Problem: Elimination: Goal: Will not experience complications related to bowel motility Outcome: Progressing Goal: Will not experience complications related to urinary retention Outcome: Progressing   Problem: Skin Integrity: Goal: Risk for impaired skin integrity will decrease Outcome: Progressing

## 2023-01-21 NOTE — Progress Notes (Signed)
[facility] Progress Note     Subjective: CC-  Resting comfortably, denies pain.  S/p bedside I&D of abdominal wall yesterday Per night RN patient had 3 loose BMs on night shift WBC 17 from 14, TMAX 101.1 yesterday morning at 0744   Objective: Vital signs in last 24 hours: Temp:  [97.8 F (36.6 C)-98.8 F (37.1 C)] 97.8 F (36.6 C) (12/21 0809) Pulse Rate:  [79-115] 98 (12/21 0809) Resp:  [17-34] 18 (12/21 0351) BP: (128-155)/(49-68) 144/59 (12/21 0809) SpO2:  [97 %-100 %] 100 % (12/21 0809) Weight:  [69 kg] 69 kg (12/21 0500) Last BM Date : 01/20/23  Intake/Output from previous day: 12/20 0701 - 12/21 0700 In: 952 [P.O.:952] Out: 1650 [Urine:1650] Intake/Output this shift: No intake/output data recorded.  PE: Gen:  Alert, NAD, pleasant Pulm: normal work of breathing on room air Abd: soft, ND, +BS, PEG site with some purulent drainage on dressing, LLQ I&D site with penrose drain present and scant SS fluid on dressing, medial I&D site closer to the PEG tube draining a small amount of purulent drainage. There is some cellulitis and induration present without palpable residual fluctuance. Applying pressure between the peg tube and the more medial drain elicits purulence     Lab Results:  Recent Labs    01/20/23 0401 01/21/23 0803  WBC 17.5* 17.2*  HGB 8.7* 9.1*  HCT 26.3* 28.0*  PLT 271 214   BMET Recent Labs    01/20/23 0401 01/21/23 0803  NA 133* 135  K 4.5 4.7  CL 108 107  CO2 19* 19*  GLUCOSE 150* 137*  BUN 21 26*  CREATININE 0.70 0.78  CALCIUM 8.5* 8.5*   PT/INR No results for input(s): "LABPROT", "INR" in the last 72 hours. CMP     Component Value Date/Time   NA 135 01/21/2023 0803   NA 143 12/07/2022 1535   NA 141 04/13/2011 1125   K 4.7 01/21/2023 0803   K 3.4 (L) 04/13/2011 1125   CL 107 01/21/2023 0803   CL 105 04/13/2011 1125   CO2 19 (L) 01/21/2023 0803   CO2 29 04/13/2011 1125   GLUCOSE 137 (H) 01/21/2023 0803    GLUCOSE 111 (H) 04/13/2011 1125   BUN 26 (H) 01/21/2023 0803   BUN 16 12/07/2022 1535   BUN 14 04/13/2011 1125   CREATININE 0.78 01/21/2023 0803   CREATININE 0.80 08/01/2018 1544   CALCIUM 8.5 (L) 01/21/2023 0803   CALCIUM 9.2 04/13/2011 1125   PROT 6.3 (L) 01/21/2023 0803   PROT 7.6 12/07/2022 1535   PROT 7.8 04/13/2011 1125   ALBUMIN 1.9 (L) 01/21/2023 0803   ALBUMIN 4.4 12/07/2022 1535   ALBUMIN 4.0 04/13/2011 1125   AST 73 (H) 01/21/2023 0803   AST 24 04/13/2011 1125   ALT 162 (H) 01/21/2023 0803   ALT 20 04/13/2011 1125   ALKPHOS 83 01/21/2023 0803   ALKPHOS 51 04/13/2011 1125   BILITOT 0.5 01/21/2023 0803   BILITOT 0.8 12/07/2022 1535   BILITOT 0.6 04/13/2011 1125   GFRNONAA >60 01/21/2023 0803   GFRNONAA 67 08/01/2018 1544   GFRAA 78 08/01/2018 1544   Lipase  No results found for: "LIPASE"     Studies/Results: No results found.   Anti-infectives: Anti-infectives (From admission, onward)    Start     Dose/Rate Route Frequency Ordered Stop   01/17/23 1130  ceFEPIme (MAXIPIME) 2 g in sodium chloride 0.9 % 100 mL IVPB        2 g 200  mL/hr over 30 Minutes Intravenous Every 12 hours 01/17/23 1038 01/24/23 0959   01/16/23 0945  cefTRIAXone (ROCEPHIN) 2 g in sodium chloride 0.9 % 100 mL IVPB  Status:  Discontinued        2 g 200 mL/hr over 30 Minutes Intravenous Every 24 hours 01/16/23 0931 01/17/23 1038        Assessment/Plan Abdominal wall abscess -S/p PEG placement 12/9 Dr. Bedelia Person. PEG dislodged, tract maintained, able to get a foley into the tract. Exchanged for IR PEG 12/16.   -S/p bedside I&D of abdominal wall with penrose drain placement 12/16 by Dr. Janee Morn. S/p bedside I&D of abdominal wall with penrose placement 12/19 by Carlena Bjornstad, PA-C - Tolerating tube feedings at goal. Xray 12/18 confirmed PEG in good location without extravasation. - Cx NGTD - cellulitis remains, WBC stable today. Continue IV abx for cellulitis and allow abdominal wall to  continue draining. Consider other sources of leukocytosis as well - stop senna given diarrhea. Diarrhea not unexpected given tube feeds. Improved with low dose banatrol. add destin to protect her skin.     FEN - D1 diet, tube feeds at goal VTE - SCD's, sq heparin, DOAC held in the setting of ICH ID - Enterobacter Bacteremia 12/15,  cefepime 12/17>>    Transaminitis - alk phos and bilirubin are WNL; gallbladder is surgically absent  R SAH  Cerebral aneurysm  HTN emergency Hx ischemic R MCA stroke  A.fib  HLD ODA Osteoarthritis   I reviewed last 24 h vitals and pain scores, last 48 h intake and output, last 24 h labs and trends, and last 24 h imaging results.    LOS: 7 days    Eric Form, [facility] 01/21/2023, 11:40 AM Please see Amion for pager number during day hours 7:00am-4:30pm

## 2023-01-21 NOTE — Progress Notes (Signed)
STROKE TEAM PROGRESS NOTE   BRIEF HPI [name] is a 89 y.o. female with history of Arthritis of both knees, Colitis, Essential hypertension, GERD, iron deficiency anemia, Hyperlipidemia, Lumbar pain, OSA on CPAP (08/01/2015), Overweight, Prediabetes, and SOB., atrial fibrillation (on Eliquis) who was recently discharged from the Stroke service at [facility] on 12/11 after management of an acute right MCA territory infarct in the setting of atrial fibrillation. She had presented at that time with left-sided weakness and confusion. Imaging had revealed a right A1 and M2 occlusion and she underwent successful mechanical thrombectomy, with TICI 3 revascularization. which was successful. Her Eliquis was restarted on 12/10 for her chronic A-fib. She was initially unable to pass her swallow study, and PEG tube was inserted on 12/9 with tube feeding started, although patient did pass swallow study afterwards and was beginning to take foods by mouth at the time of her discharge. She did have some pain in her left knee, which was successfully treated with lidocaine patches. She was discharged to a SNF.   She re-presented to the ED at [facility] on Friday from [facility] rehab facility for acute stroke symptoms. LKN was 1830. She had been at [facility] for LUE weakness. Per EMS she had new onset L side facial droop, numbness to L arm, and significantly increased weakness to L arm. CTH showed interval development of scattered subarachnoid hemorrhage in the right sylvian fissure. Teleneurology was consulted. NIHSS was 17. Teleneurology advised DOAC reversal with KCentra, BP management, EEG for observed facial twitching and admission as well as Neurosurgery consult. Neurosurgery recommended Keppra 500 mg. CTA showed no LVO; atheromatous plaque was again noted. 3-4 mm outpouchings extending posteriorly and inferiorly from both supraclinoid ICAs were noted, which Radiology felt could reflect small aneurysms versus vascular  infundibula; the outpouchings were similar in appearance compared to recent MRA from 12/30/2022.  NIH on Admission 17  SIGNIFICANT HOSPITAL EVENTS 12/11- Discharged to rehab 12/14- Admitted with [facility] 12/15-dislodged PEG tube, plan to replace and IR on 12/16 12/16-transferred out of the ICU, incision and drainage of left abdominal wall infection. 12/17-blood cultures positive for Enterobacter cloacae, cefepime started 12/19-additional I&D of abdominal wall performed  INTERIM HISTORY/SUBJECTIVE Patient has been hemodynamically stable.  Neurological exam is unchanged with persistent left hemiparesis.  She afebrile today   .  Leukocytosis is stable at 17.2 today OBJECTIVE  CBC    Component Value Date/Time   WBC 17.2 (H) 01/21/2023 0803   RBC 3.32 (L) 01/21/2023 0803   HGB 9.1 (L) 01/21/2023 0803   HGB 12.5 09/19/2022 0916   HCT 28.0 (L) 01/21/2023 0803   HCT 39.6 09/19/2022 0916   PLT 214 01/21/2023 0803   PLT 169 09/19/2022 0916   MCV 84.3 01/21/2023 0803   MCV 87 09/19/2022 0916   MCV 82 04/13/2011 1125   MCH 27.4 01/21/2023 0803   MCHC 32.5 01/21/2023 0803   RDW 14.6 01/21/2023 0803   RDW 12.9 09/19/2022 0916   RDW 14.4 04/13/2011 1125   LYMPHSABS 1.3 01/13/2023 2020   LYMPHSABS 3.7 (H) 07/08/2015 1136   MONOABS 0.7 01/13/2023 2020   EOSABS 0.1 01/13/2023 2020   EOSABS 0.2 07/08/2015 1136   BASOSABS 0.0 01/13/2023 2020   BASOSABS 0.1 07/08/2015 1136    BMET    Component Value Date/Time   NA 135 01/21/2023 0803   NA 143 12/07/2022 1535   NA 141 04/13/2011 1125   K 4.7 01/21/2023 0803   K 3.4 (L) 04/13/2011 1125  CL 107 01/21/2023 0803   CL 105 04/13/2011 1125   CO2 19 (L) 01/21/2023 0803   CO2 29 04/13/2011 1125   GLUCOSE 137 (H) 01/21/2023 0803   GLUCOSE 111 (H) 04/13/2011 1125   BUN 26 (H) 01/21/2023 0803   BUN 16 12/07/2022 1535   BUN 14 04/13/2011 1125   CREATININE 0.78 01/21/2023 0803   CREATININE 0.80 08/01/2018 1544   CALCIUM 8.5 (L) 01/21/2023 0803    CALCIUM 9.2 04/13/2011 1125   EGFR 74 12/07/2022 1535   GFRNONAA >60 01/21/2023 0803   GFRNONAA 67 08/01/2018 1544    IMAGING past 24 hours No results found.   Vitals:   01/21/23 0351 01/21/23 0500 01/21/23 0809 01/21/23 1206  BP: (!) 128/50  (!) 144/59 128/60  Pulse: (!) 104  98 90  Resp: 18   (!) 26  Temp: 97.9 F (36.6 C)  97.8 F (36.6 C) 98.7 F (37.1 C)  TempSrc: Oral  Oral Oral  SpO2: 99%  100% 100%  Weight:  69 kg    Height:         PHYSICAL EXAM General:  Alert, well-nourished, well-developed elderly patient in no acute distress Psych:  Mood and affect appropriate for situation CV: Regular rate and rhythm on monitor Respiratory:  Regular, unlabored respirations on room air GI: Abdomen soft and nontender  Neurological Examination Mental Status: Awakens to voice. Able to give her name but unable to correctly answer  orientation questions. Able to protrude tongue and weakly squeeze examiner's hand to command. Severe dysarthria.  Usually responds with one-word answers but can sometimes make a short sentence Cranial Nerves: II:  PERRL   III,IV, VI: No ptosis.Eyes are conjugate.  V: Reacts to touch bilaterally  VII: Smiles symmetrically to command  VIII: Hearing intact to voice IX,X: Gag reflex deferred XI: Unable to assess due to poor comprehension of commands XII: Midline tongue extension Motor/Sensory: RUE: Weakly grips examiner's hand.  Able to raise arm off the bed LUE: Weakly moves left upper extremity to command BLE: weakly moves bilateral lower extremities to command Cerebellar: Unable to assess Gait: Unable to assess    ASSESSMENT/PLAN  Subarachnoid Hemorrhage :  scattered SAH in right sylvian fissure Etiology:  eliquis   Code Stroke CT head - Interval development of scattered small volume subarachnoid hemorrhage within the right Sylvian fissure. No significant mass effect. Upon review of recent MRA there is question of a 3-4 mm outpouching at  the takeoff of the right PCOM, which could potentially reflect a small aneurysm versus infundibulum, not appreciated on initial interpretation. This finding is not certain, as no aneurysm is described at this location on recent arteriogram. Evolving right MCA distribution infarcts, similar to prior. Aspects is 7. CTA head & neck -  3-4 mm outpouchings extending posteriorly and inferiorly from both supraclinoid ICAs, which could reflect small aneurysms versus vascular infundibula. Appearance is similar as compared to recent MRA from 12/30/2022. Moderate atheromatous plaque about the carotid bifurcations and carotid siphons without hemodynamically significant stenosis. Atheromatous plaque at the origin of the right vertebral artery with moderate to severe stenosis. LDL 33 HgbA1c 6.4 EEG- This study is suggestive of cortical dysfunction arising from right hemisphere likely secondary to underlying structural abnormality. Additionally there is moderate diffuse encephalopathy. No seizures were seen throughout the recording. Generalized sharp transients were noted. Recommend long term monitoring for further evaluation Long-term EEG 12/15: Continuous slow, generalized and lateralized right hemisphere, suggestive of cortical dysfunction arising from right hemisphere and moderate diffuse  encephalopathy, no seizures seen VTE prophylaxis - SCDs Eliquis (apixaban) daily prior to admission, now on No antithrombotic  Therapy recommendations: SNF Disposition: Transferred out of ICU, awaiting bed on 3 W.  Hx of Stroke/TIA 11/28-12/11- Acute Ischemic Infarct:  right MCA territory infarct with R M2 and A1 occlusion s/p IR with TICI 3 revascularization, etiology: Cardioembolic in the setting of A-fib  Code Stroke  CT head  -hyperdense right MCA, cytotoxic edema in right stratum and insula.  ASPECTS 7   CTA head & neck acute clot at the right terminal ICA and acute obstructing right MCA M2 and ACA A1. CT perfusion 6/149  in the right MCA territory.  S/p IR via right ICA direct access with TICI3 MRI scattered acute right MCA distribution infarcts involving the right caudate and lentiform nuclei, right insula, and overlying right cerebral hemisphere, small volume petechial hemorrhage at the posterior right frontal region MRA  with interval revascularization of previously seen clot at the right ICA terminus and right M2 segment, Focal moderate distal right P2 stenosis. 2D Echo EF 60 to 65%.  LV severe concentric hypertrophy.  LA severely dilated LDL 33 HgbA1c 6.4  Abnormal EEG LTM connected Routine EEG 12/14 This study is suggestive of cortical dysfunction arising from right hemisphere likely secondary to underlying structural abnormality. Additionally there is moderate diffuse encephalopathy. No seizures were seen throughout the recording. Generalized sharp transients were noted. Recommend long term monitoring for further evaluation  Atrial fibrillation Home Meds: Eliquis Reversed with Kcentra at [facility] On tele Rate controlled On hold due to [facility]   Cerebral aneurysm now with [facility] CTA- -4 mm outpouchings extending posteriorly and inferiorly from both supraclinoid ICAs, which could reflect small aneurysms versus vascular infundibula. Appearance is similar as compared to recent MRA from 12/30/2022. Moderate atheromatous plaque about the carotid bifurcations and carotid siphons without hemodynamically significant stenosis. Atheromatous plaque at the origin of the right vertebral artery with moderate to severe stenosis. Nimodipine 60mg  q4hr  Yearly MRA follow up as outpt   Hypertensive Emergency Home meds: Imdur 120 mg, olmesartan-amlodipine-HCTZ 40-10-12.5 mg, spironolactone 25 mg Amlodipine 5mg , nimodipine 60mg  q4hr BP goal: SBP 130-150 Long-term BP goal normotensive   Hyperlipidemia Home meds: Lipitor 40 mg, resumed in hospital LDL 33, goal < 70 Statin discontinued 12/17 due to elevated LFTs, will resume  when LFTs return to baseline   Dysphagia Patient has post-stroke dysphagia, SLP consulted Continue NPO PEG Tube 12/9 Resume TF- Osmolite 1.2@55ml /hr- Initiate at 59ml/hr and increase by 47ml/hr q 8 hours until goal rate is each.  Free q6  Dietitian consulted Trace purulent drainage, tenderness around PEG- trauma re engaged CT Abd pending  PEG tube dislodged 12/15, Foley catheter placed in tract, Foley exchanged for PEG tube in IR on 12/16   Other Stroke Risk Factors  Obstructive sleep apnea,  on CPAP at home  Advanced age   Other acute issues Left knee pain - lidocaine patch - arthritis in bilateral knees- f/u outpatient  10 beat run of V. tach on 12/16-spontaneously terminated, will recheck BMP and magnesium 8 beat run of VT on 12/18- spontaneously resolved, asymptomatic - mag 2.2, K 4.8 Left abdominal wall infection-I&D performed 12/16  Other Active Problems Leukocytosis WBC 17.5 -> 15.0 -> 19.2-> 15.4 -> 13.7 -> 14.6-> 17.5 Tmax 99.6 ID of abdominal wall abscess performed twice Blood cultures 1 out of 3 positive for Enterobacter cloaca  Begin cefepime 12/17 CXR, UA ordered  Trend Completed 7 days of ceftriaxone during last hospitalization  due to aspiration pna Transaminitis 176/286 -> 123/208-> 427/354 -> 189/302-> 110/216 Trend, statin stopped 12/17  Hospital day # 7    Patient remained febrile with increasing white count despite I&D drainage and being on antibiotics for now for several days.  Appreciate medical hospitalist team to take over her care.  I called patient's daughter Gilford Raid yesterday and discussed patient's care with her and answered her questions.  Await sensitivity of Enterobacter cloak he to decide if changing antibiotic to maxipeme is necessary.  Stroke team will sign off.  Kindly call for questions.  Discussed with Dr. Shaune Spittle, MD Medical Director [facility] Pager: [phone] 01/21/2023 12:11 PM   To  contact Stroke Continuity provider, please refer to [email or web address]. After hours, contact General Neurology

## 2023-01-22 DIAGNOSIS — I619 Nontraumatic intracerebral hemorrhage, unspecified: Secondary | ICD-10-CM | POA: Diagnosis not present

## 2023-01-22 LAB — GLUCOSE, CAPILLARY
Glucose-Capillary: 124 mg/dL — ABNORMAL HIGH (ref 70–99)
Glucose-Capillary: 138 mg/dL — ABNORMAL HIGH (ref 70–99)
Glucose-Capillary: 131 mg/dL — ABNORMAL HIGH (ref 70–99)

## 2023-01-22 LAB — CBC
HCT: 27 % — ABNORMAL LOW (ref 36.0–46.0)
Hemoglobin: 8.8 g/dL — ABNORMAL LOW (ref 12.0–15.0)
MCH: 27.2 pg (ref 26.0–34.0)
MCHC: 32.6 g/dL (ref 30.0–36.0)
MCV: 83.6 fL (ref 80.0–100.0)
Platelets: 251 10*3/uL (ref 150–400)
RBC: 3.23 MIL/uL — ABNORMAL LOW (ref 3.87–5.11)
RDW: 15 % (ref 11.5–15.5)
WBC: 15.7 10*3/uL — ABNORMAL HIGH (ref 4.0–10.5)
nRBC: 0.2 % (ref 0.0–0.2)

## 2023-01-22 LAB — COMPREHENSIVE METABOLIC PANEL
ALT: 124 U/L — ABNORMAL HIGH (ref 0–44)
AST: 55 U/L — ABNORMAL HIGH (ref 15–41)
Albumin: 1.9 g/dL — ABNORMAL LOW (ref 3.5–5.0)
Alkaline Phosphatase: 83 U/L (ref 38–126)
Anion gap: 6 (ref 5–15)
BUN: 21 mg/dL (ref 8–23)
CO2: 23 mmol/L (ref 22–32)
Calcium: 8.5 mg/dL — ABNORMAL LOW (ref 8.9–10.3)
Chloride: 104 mmol/L (ref 98–111)
Creatinine, Ser: 0.56 mg/dL (ref 0.44–1.00)
GFR, Estimated: >60 mL/min (ref >60)
Glucose, Bld: 144 mg/dL — ABNORMAL HIGH (ref 70–99)
Potassium: 4.9 mmol/L (ref 3.5–5.1)
Sodium: 133 mmol/L — ABNORMAL LOW (ref 135–145)
Total Bilirubin: 0.5 mg/dL (ref <1.2)
Total Protein: 6.4 g/dL — ABNORMAL LOW (ref 6.5–8.1)

## 2023-01-22 LAB — MAGNESIUM: Magnesium: 1.8 mg/dL (ref 1.7–2.4)

## 2023-01-22 LAB — PHOSPHORUS: Phosphorus: 2.8 mg/dL (ref 2.5–4.6)

## 2023-01-22 MED ORDER — FERROUS SULFATE 300 (60 FE) MG/5ML PO SOLN
60.0000 mg | Freq: Every day | ORAL | Status: DC
  Administered 2023-01-22 – 2023-02-07 (×16): 60 mg
  Filled 2023-01-22 (×17): qty 5

## 2023-01-22 NOTE — Progress Notes (Signed)
[facility] Progress Note     Subjective: CC-  No new complaints. afebrile  Objective: Vital signs in last 24 hours: Temp:  [98.1 F (36.7 C)-99 F (37.2 C)] 99 F (37.2 C) (12/22 0807) Pulse Rate:  [90-111] 98 (12/22 0807) Resp:  [19-26] 24 (12/22 0807) BP: (128-166)/(54-94) 130/54 (12/22 0807) SpO2:  [98 %-100 %] 98 % (12/22 0807) FiO2 (%):  [21 %] 21 % (12/22 0031) Last BM Date : 01/21/23  Intake/Output from previous day: 12/21 0701 - 12/22 0700 In: 3218.5 [P.O.:540; NG/GT:2678.5] Out: 1175 [Urine:1175] Intake/Output this shift: No intake/output data recorded.  PE: Gen:  Alert, NAD, pleasant Pulm: normal work of breathing on room air Abd: soft, ND, +BS, PEG site with some purulent drainage on dressing, LLQ I&D site with penrose drain present and scant SS fluid on dressing, medial I&D site closer to the PEG tube draining a small amount of purulent drainage. There is some cellulitis and induration present without palpable residual fluctuance . Applying pressure between the peg tube and the more medial drain elicits purulence. Exam is stable from yesterday     Lab Results:  Recent Labs    01/21/23 0803 01/22/23 0518  WBC 17.2* 15.7*  HGB 9.1* 8.8*  HCT 28.0* 27.0*  PLT 214 251   BMET Recent Labs    01/21/23 0803 01/22/23 0518  NA 135 133*  K 4.7 4.9  CL 107 104  CO2 19* 23  GLUCOSE 137* 144*  BUN 26* 21  CREATININE 0.78 0.56  CALCIUM 8.5* 8.5*   PT/INR No results for input(s): "LABPROT", "INR" in the last 72 hours. CMP     Component Value Date/Time   NA 133 (L) 01/22/2023 0518   NA 143 12/07/2022 1535   NA 141 04/13/2011 1125   K 4.9 01/22/2023 0518   K 3.4 (L) 04/13/2011 1125   CL 104 01/22/2023 0518   CL 105 04/13/2011 1125   CO2 23 01/22/2023 0518   CO2 29 04/13/2011 1125   GLUCOSE 144 (H) 01/22/2023 0518   GLUCOSE 111 (H) 04/13/2011 1125   BUN 21 01/22/2023 0518   BUN 16 12/07/2022 1535   BUN 14 04/13/2011 1125    CREATININE 0.56 01/22/2023 0518   CREATININE 0.80 08/01/2018 1544   CALCIUM 8.5 (L) 01/22/2023 0518   CALCIUM 9.2 04/13/2011 1125   PROT 6.4 (L) 01/22/2023 0518   PROT 7.6 12/07/2022 1535   PROT 7.8 04/13/2011 1125   ALBUMIN 1.9 (L) 01/22/2023 0518   ALBUMIN 4.4 12/07/2022 1535   ALBUMIN 4.0 04/13/2011 1125   AST 55 (H) 01/22/2023 0518   AST 24 04/13/2011 1125   ALT 124 (H) 01/22/2023 0518   ALT 20 04/13/2011 1125   ALKPHOS 83 01/22/2023 0518   ALKPHOS 51 04/13/2011 1125   BILITOT 0.5 01/22/2023 0518   BILITOT 0.8 12/07/2022 1535   BILITOT 0.6 04/13/2011 1125   GFRNONAA >60 01/22/2023 0518   GFRNONAA 67 08/01/2018 1544   GFRAA 78 08/01/2018 1544   Lipase  No results found for: "LIPASE"     Studies/Results: No results found.   Anti-infectives: Anti-infectives (From admission, onward)    Start     Dose/Rate Route Frequency Ordered Stop   01/17/23 1130  ceFEPIme (MAXIPIME) 2 g in sodium chloride 0.9 % 100 mL IVPB        2 g 200 mL/hr over 30 Minutes Intravenous Every 12 hours 01/17/23 1038 01/24/23 0959   01/16/23 0945  cefTRIAXone (ROCEPHIN) 2 g in sodium  chloride 0.9 % 100 mL IVPB  Status:  Discontinued        2 g 200 mL/hr over 30 Minutes Intravenous Every 24 hours 01/16/23 0931 01/17/23 1038        Assessment/Plan Abdominal wall abscess -S/p PEG placement 12/9 Dr. Bedelia Person. PEG dislodged, tract maintained, able to get a foley into the tract. Exchanged for IR PEG 12/16.   -S/p bedside I&D of abdominal wall with penrose drain placement 12/16 by Dr. Janee Morn. S/p bedside I&D of abdominal wall with penrose placement 12/19 by Carlena Bjornstad, PA-C - Tolerating tube feedings at goal. Xray 12/18 confirmed PEG in good location without extravasation. - Cx NGTD - cellulitis remains, WBC improved today. Continue IV abx for cellulitis and allow abdominal wall to continue draining. Consider other sources of leukocytosis as well - stop senna given diarrhea. Diarrhea not  unexpected given tube feeds. Improved with low dose banatrol. add destin to protect her skin.     FEN - D1 diet, tube feeds at goal VTE - SCD's, sq heparin, DOAC held in the setting of ICH ID - Enterobacter Bacteremia 12/15,  cefepime 12/17>>    Transaminitis - alk phos and bilirubin are WNL; gallbladder is surgically absent  R SAH  Cerebral aneurysm  HTN emergency Hx ischemic R MCA stroke  A.fib  HLD ODA Osteoarthritis   I reviewed last 24 h vitals and pain scores, last 48 h intake and output, last 24 h labs and trends, and last 24 h imaging results.    LOS: 8 days    Eric Form, [facility] 01/22/2023, 8:46 AM Please see Amion for pager number during day hours 7:00am-4:30pm

## 2023-01-22 NOTE — Progress Notes (Signed)
Patient complained of pain in her knees and legs, was quite tachypnoec, resp rate in the range 28-32 making her MEWS yellow, gave her PRN pain med, called RT to assess her, lungs sound clear, no signs of aspiration noted, will continue to monitor.

## 2023-01-22 NOTE — Progress Notes (Signed)
SEE OMALLEY  WJX:[id number] DOB: April 01, 1931 DOA: 01/14/2023 PCP: Gorden Harms, PA-C    Brief Narrative:  89 year old with a history of OA bilateral knees, HTN, GERD, chronic iron deficiency anemia, HLD, OSA on CPAP, pre-DM, and chronic atrial fibrillation on Eliquis who was discharged from the stroke service at [facility] 12/11 after management of an acute right MCA territory infarct related to her atrial fibrillation.  During that preceding admission she presented with left-sided weakness and was found to have a right A1 and M2 occlusion for which she underwent successful mechanical thrombectomy/revascularization.  She was ultimately discharged to a SNF.  She returned to the ER at [facility] from her rehab facility on 12/14 after developing a new left-sided facial droop and left arm numbness with weakness.  CT head at presentation revealed interval development of scattered subarachnoid hemorrhage in the right sylvian fissure.  Her Eliquis was reversed with Kcentra.  CTa revealed no LVO but atheromatous plaque with outpouchings extending posteriorly and inferiorly from both supraclinoid ICAs possibly representing small aneurysms.  Significant Events:  12/14 Admitted with [facility] 12/15 dislodged PEG tube 12/15 long term EEG w/o evidence of seizure activity  12/16 transferred out of the ICU, incision and drainage of left abdominal wall infection. 12/17 blood cultures positive for Enterobacter cloacae, cefepime started 12/19 additional I&D of abdominal wall performed 12/21 [facility] assumed care of patient  Goals of Care:   Code Status: Full Code   DVT prophylaxis: heparin injection 5,000 Units Start: 01/17/23 1400 SCD's Start: 01/14/23 0129   Interim Hx: Afebrile.  Vital signs stable.  CBG well-controlled.  LFTs continue to trend downward.  Resting comfortably in bed.  No evidence of discomfort or respiratory distress.  Interacts with examiner and follows me with her eyes but does not speak to  me.  Assessment & Plan:  Subarachnoid hemorrhage -possible cerebral aneurysm Noted at time of this readmission - occurred while on Eliquis therapy -imaging has suggested outpouchings extending posteriorly and inferiorly from both supraclinoid ICAs which could reflect small aneurysms -these were noted on CTa as well as MRa - yearly MRa is recommended in follow-up by the stroke team  Recent CVA Admitted to the hospital 11/28-12/12/2022 for treatment of a right MCA territory infarct felt to be cardioembolic due to A-fib -an acute clot was noted at the right terminal ICA and the patient underwent intervention with subsequent revascularization  Abnormal EEG Routine EEG 12/14 raised the question of cortical dysfunction arising from the right hemisphere but with no evidence of seizure activity -long-term EEG revealed no evidence of active seizure activity  Hypertensive emergency Blood pressure presently well-controlled  Chronic atrial fibrillation Eliquis on hold due to [facility]  Transaminitis Statin on hold -LFTs steadily improving  Recent Labs  Lab 01/18/23 0554 01/19/23 0613 01/20/23 0401 01/21/23 0803 01/22/23 0518  AST 332* 189* 110* 73* 55*  ALT 375* 302* 216* 162* 124*  ALKPHOS 111 100 88 83 83  BILITOT 0.6 0.7 0.6 0.5 0.5  PROT 6.5 6.4* 6.2* 6.3* 6.4*  ALBUMIN 1.9* 1.9* 1.8* 1.9* 1.9*     HLD LDL 33 - statin discontinued 12/17 due to elevated LFTs  Dysphagia due to stroke PEG tube originally placed 12/9 during prior admission -this became dislodged 12/15 with the tract temporarily preserved using a Foley catheter which was subsequently exchanged for a replacement PEG tube in IR 12/16  abdominal wall infection General Surgery has been following with I&D performed 12/16 with Penrose drain placement and again 12/19 -continue antibiotic  therapy  Enterobacter bacteremia Noted on 1 of 2 blood cultures drawn 12/15 - third-generation cephalosporin and Zosyn resistant - remains on  IV cefepime  Family Communication: No family present at time of exam today Disposition: Will need SNF placement    Objective: Blood pressure (!) 130/54, pulse 98, temperature 99 F (37.2 C), temperature source Axillary, resp. rate (!) 24, height 5\' 3"  (1.6 m), weight 69 kg, SpO2 98%.  Intake/Output Summary (Last 24 hours) at 01/22/2023 0921 Last data filed at 01/22/2023 4098 Gross per 24 hour  Intake 3218.5 ml  Output 1175 ml  Net 2043.5 ml   Filed Weights   01/19/23 0432 01/19/23 1300 01/21/23 0500  Weight: 67.9 kg 67.9 kg 69 kg    Examination: General: No acute respiratory distress Lungs: Clear to auscultation bilaterally -no wheezing Cardiovascular: Regular rate and rhythm without murmur gallop or rub normal S1 and S2 Abdomen: Abdominal wounds dressed with abdominal binder in place, bowel sounds positive, no rebound Extremities: No significant edema bilateral lower extremities  CBC: Recent Labs  Lab 01/20/23 0401 01/21/23 0803 01/22/23 0518  WBC 17.5* 17.2* 15.7*  HGB 8.7* 9.1* 8.8*  HCT 26.3* 28.0* 27.0*  MCV 81.7 84.3 83.6  PLT 271 214 251   Basic Metabolic Panel: Recent Labs  Lab 01/16/23 1230 01/17/23 0439 01/18/23 0554 01/19/23 0613 01/20/23 0401 01/21/23 0803 01/22/23 0518  NA 138   < > 136   < > 133* 135 133*  K 4.6   < > 4.8   < > 4.5 4.7 4.9  CL 110   < > 111   < > 108 107 104  CO2 19*   < > 19*   < > 19* 19* 23  GLUCOSE 113*   < > 155*   < > 150* 137* 144*  BUN 38*   < > 31*   < > 21 26* 21  CREATININE 0.69   < > 0.79   < > 0.70 0.78 0.56  CALCIUM 8.7*   < > 8.2*   < > 8.5* 8.5* 8.5*  MG 2.6*  --  2.2  --   --   --  1.8  PHOS  --   --   --   --   --   --  2.8   < > = values in this interval not displayed.   GFR: Estimated Creatinine Clearance: 44.4 mL/min (by C-G formula based on SCr of 0.56 mg/dL).   Scheduled Meds:  amLODipine  10 mg Per Tube Daily   calcium-vitamin D  1 tablet Oral BID   Chlorhexidine Gluconate Cloth  6 each  Topical Daily   famotidine  20 mg Oral BID   ferrous sulfate  60 mg of iron Per Tube Daily   fiber supplement (BANATROL TF)  60 mL Per Tube BID   free water  200 mL Per Tube Q4H   heparin injection (subcutaneous)  5,000 Units Subcutaneous Q8H   insulin aspart  0-15 Units Subcutaneous Q8H   lidocaine  2 patch Transdermal Daily   liver oil-zinc oxide   Topical TID   mouth rinse  15 mL Mouth Rinse 4 times per day   Continuous Infusions:  ceFEPime (MAXIPIME) IV Stopped (01/21/23 2316)   feeding supplement (OSMOLITE 1.2 CAL) 55 mL/hr at 01/22/23 1191     LOS: 8 days   Lonia Blood, MD [facility] Office  [phone] Pager - Text Page per Loretha Stapler  If 7PM-7AM, please contact night-coverage per Amion 01/22/2023,  9:21 AM

## 2023-01-22 NOTE — Plan of Care (Signed)
  Problem: Activity: Goal: Risk for activity intolerance will decrease Outcome: Progressing   Problem: Nutrition: Goal: Adequate nutrition will be maintained Outcome: Progressing   Problem: Elimination: Goal: Will not experience complications related to urinary retention Outcome: Progressing   Problem: Skin Integrity: Goal: Risk for impaired skin integrity will decrease Outcome: Progressing   Problem: Coping: Goal: Will identify appropriate support needs Outcome: Progressing   Problem: Nutrition: Goal: Risk of aspiration will decrease Outcome: Progressing

## 2023-01-22 NOTE — Plan of Care (Signed)
  Problem: Clinical Measurements: Goal: Ability to maintain clinical measurements within normal limits will improve Outcome: Progressing Goal: Will remain free from infection Outcome: Progressing Goal: Diagnostic test results will improve Outcome: Progressing Goal: Respiratory complications will improve Outcome: Progressing Goal: Cardiovascular complication will be avoided Outcome: Progressing   Problem: Nutrition: Goal: Adequate nutrition will be maintained Outcome: Progressing   Problem: Elimination: Goal: Will not experience complications related to bowel motility Outcome: Progressing Goal: Will not experience complications related to urinary retention Outcome: Progressing   Problem: Skin Integrity: Goal: Risk for impaired skin integrity will decrease Outcome: Progressing   Problem: Safety: Goal: Ability to remain free from injury will improve Outcome: Progressing

## 2023-01-23 DIAGNOSIS — I619 Nontraumatic intracerebral hemorrhage, unspecified: Secondary | ICD-10-CM | POA: Diagnosis not present

## 2023-01-23 LAB — CBC
HCT: 27.8 % — ABNORMAL LOW (ref 36.0–46.0)
Hemoglobin: 8.9 g/dL — ABNORMAL LOW (ref 12.0–15.0)
MCH: 27.1 pg (ref 26.0–34.0)
MCHC: 32 g/dL (ref 30.0–36.0)
MCV: 84.5 fL (ref 80.0–100.0)
Platelets: 189 10*3/uL (ref 150–400)
RBC: 3.29 MIL/uL — ABNORMAL LOW (ref 3.87–5.11)
RDW: 15.1 % (ref 11.5–15.5)
WBC: 15.3 10*3/uL — ABNORMAL HIGH (ref 4.0–10.5)
nRBC: 0.3 % — ABNORMAL HIGH (ref 0.0–0.2)

## 2023-01-23 LAB — GLUCOSE, CAPILLARY
Glucose-Capillary: 140 mg/dL — ABNORMAL HIGH (ref 70–99)
Glucose-Capillary: 137 mg/dL — ABNORMAL HIGH (ref 70–99)
Glucose-Capillary: 139 mg/dL — ABNORMAL HIGH (ref 70–99)

## 2023-01-23 NOTE — Progress Notes (Signed)
Speech Language Pathology Treatment: Dysphagia  Patient Details Name: Jocelyn [name] MRN: [id number] DOB: 06/13/1931 Today's Date: 01/23/2023 Time: 1000-1025 SLP Time Calculation (min) (ACUTE ONLY): 25 min  Assessment / Plan / Recommendation Clinical Impression  Pt participated in dysphagia tx with compensatory strategies utilized including min verbal cues to eliminate/reduce oral holding/take smaller bites/sips of liquids. Pt would fill oral cavity and swallow large,successive swallows without cues provided. Anterior loss noted when pt attempted to self feed sips of coffee with A. Straw usage assisted with minimizing anterior loss and guided sips given to reduce volume of thin liquids. Pt answered questions regarding family/personal information and problem solved/followed simple directives related to dysphagia tx. Pt conversed with SLP regarding past information with min verbal cues required to continue conversation and maintain attention to specific questions initiated by SLP. Recommend continue ST in acute setting.    HPI HPI: 89 yo female presenting back to the hospital (transfer from ED at [facility]) from SNF on 12/14 with acute stroke symptoms and per EMS she had a new onset of left sided facial droop, numbness to left arm and significantly increased weakness of left arm. CTH showed showed interval development of scattered subarachnoid hemorrhage in the right sylvian fissure. Neurosurgery consulted and recommended Keppra 500mg . WBC elevated and trauma was consulted to evaluate PEG tube with CT abdomen pending. She was recently admitted 11/27-12/11 with L sided weakness and R MCA occlusion s/p IR revascularization with complicated arterial cannulation requiring multiple sticks. Intubated 11/28-11/29. PMH includes: GERD, HTN, CHF, AFIB RVR on eliquis, HLD, OSA. MBSS 01/02/23 was limited due to oral holding, repeat MBS on 12/11 showed moderate oral dysphagia and suspected pharyngoesophageal dysphagia with  prominent cricopharyngeaus and recommened PO diet of Dys 1 (puree), thin liquids; ST f/u for dysphagia tx/potential upgrade to diet.      SLP Plan  Continue with current plan of care      Recommendations for follow up therapy are one component of a multi-disciplinary discharge planning process, led by the attending physician.  Recommendations may be updated based on patient status, additional functional criteria and insurance authorization.    Recommendations  Diet recommendations: Dysphagia 1 (puree);Thin liquid Liquids provided via: Cup;Straw Medication Administration: Whole meds with liquid Supervision: Trained caregiver to feed patient;Intermittent supervision to cue for compensatory strategies Compensations: Small sips/bites;Slow rate;Monitor for anterior loss Postural Changes and/or Swallow Maneuvers: Seated upright 90 degrees                  Oral care BID   Frequent or constant Supervision/Assistance Dysphagia, oropharyngeal phase (R13.12);Cognitive communication deficit (R41.841)     Continue with current plan of care     Pat Jocelyn [name],M.S.,CCC-SLP  01/23/2023, 12:18 PM

## 2023-01-23 NOTE — Plan of Care (Signed)
  Problem: Clinical Measurements: Goal: Ability to maintain clinical measurements within normal limits will improve Outcome: Progressing   Problem: Pain Management: Goal: General experience of comfort will improve Outcome: Progressing   Problem: Skin Integrity: Goal: Risk for impaired skin integrity will decrease Outcome: Progressing   Problem: Safety: Goal: Ability to remain free from injury will improve Outcome: Progressing   Problem: Nutritional: Goal: Maintenance of adequate nutrition will improve Outcome: Progressing

## 2023-01-23 NOTE — TOC Progression Note (Addendum)
Transition of Care [facility]) - Progression Note    Patient Details  Name: Jocelyn [name] MRN: [id number] Date of Birth: 08-02-1931  [facility]) CM/SW Contact  Carley Hammed, LCSW Phone Number: 01/23/2023, 11:00 AM  Clinical Narrative:    CSW reviewed chart and noted pt is from [facility] but requesting to go to [facility]. CSW spoke with [facility] who will have DON review to see if she is a candidate. TOC will continue to follow.   12:00 [facility] noted they will decline as pt's needs are too complex. CSW spoke with [facility] and confirmed no barriers to her returning when she is medically ready. CSW left a VM for dtr to discuss. TOC will continue to follow.   1:00 CSW spoke with dtr, Jocelyn [name], who noted frustration with pt's hospital course and requesting to speak with MD for an update, MD notified. Dtr notes understanding that [facility] is unable to offer and agreeable to pt returning to [facility]. Per MD pt may be ready to go soon if medical workup completes. Will follow to start auth when appropriate.  Expected Discharge Plan: Skilled Nursing Facility    Expected Discharge Plan and Services                                               Social Determinants of Health (SDOH) Interventions SDOH Screenings   Food Insecurity: Patient Unable To Answer (01/14/2023)  Housing: Patient Unable To Answer (01/14/2023)  Transportation Needs: Unknown (01/14/2023)  Utilities: Patient Unable To Answer (01/14/2023)  Alcohol Screen: Low Risk  (12/03/2018)  Depression (PHQ2-9): Low Risk  (12/06/2022)  Financial Resource Strain: Medium Risk (07/26/2018)  Physical Activity: Inactive (07/26/2018)  Social Connections: Moderately Isolated (07/26/2018)  Stress: No Stress Concern Present (07/26/2018)  Tobacco Use: Low Risk  (01/19/2023)    Readmission Risk Interventions     No data to display

## 2023-01-23 NOTE — Consult Note (Signed)
WOC Nurse Consult Note: Reason for Consult: Consult requested for sacrum.  Pt has pink dry scar tissue from another previous pressure injury which has healed, but it has reopened in the center to a Stage 2 pressure injury; 1X.1X.1cm, pink and moist. Pressure Injury POA: No Dressing procedure/placement/frequency: Pt is on q low air-loss mattress to reduce pressure.  Topical treatment orders provided for bedside nurses to perform as follows: Foam dressing to sacrum, change Q 3 days or PRN soiling. Please re-consult if further assistance is needed.  Thank-you,  Cammie Mcgee MSN, RN, CWOCN, [place], CNS [phone]

## 2023-01-23 NOTE — Progress Notes (Signed)
[facility] Progress Note     Subjective: No complaints. No family at bedside this AM.   Objective: Vital signs in last 24 hours: Temp:  [97.9 F (36.6 C)-99.1 F (37.3 C)] 97.9 F (36.6 C) (12/23 0813) Pulse Rate:  [89-101] 94 (12/23 0813) Resp:  [18-32] 18 (12/23 0813) BP: (130-157)/(50-72) 142/64 (12/23 0813) SpO2:  [99 %-100 %] 99 % (12/23 0813) Last BM Date : 01/21/23  Intake/Output from previous day: 12/22 0701 - 12/23 0700 In: 1864.4 [NG/GT:1115.6; IV Piggyback:598.9] Out: 1650 [Urine:1650] Intake/Output this shift: No intake/output data recorded.  PE: Gen:  Alert, NAD, pleasant Pulm: normal work of breathing on room air Abd: soft, ND, +BS, PEG site with some purulent drainage on dressing, LLQ I&D site with penrose drain present and scant SS fluid on dressing, medial I&D site closer to the PEG tube with scant purulent drainage. There is some cellulitis and induration present without palpable residual fluctuance . Applying pressure between the peg tube and the more medial drain elicits purulence.     Lab Results:  Recent Labs    01/22/23 0518 01/23/23 0507  WBC 15.7* 15.3*  HGB 8.8* 8.9*  HCT 27.0* 27.8*  PLT 251 189   BMET Recent Labs    01/21/23 0803 01/22/23 0518  NA 135 133*  K 4.7 4.9  CL 107 104  CO2 19* 23  GLUCOSE 137* 144*  BUN 26* 21  CREATININE 0.78 0.56  CALCIUM 8.5* 8.5*   PT/INR No results for input(s): "LABPROT", "INR" in the last 72 hours. CMP     Component Value Date/Time   NA 133 (L) 01/22/2023 0518   NA 143 12/07/2022 1535   NA 141 04/13/2011 1125   K 4.9 01/22/2023 0518   K 3.4 (L) 04/13/2011 1125   CL 104 01/22/2023 0518   CL 105 04/13/2011 1125   CO2 23 01/22/2023 0518   CO2 29 04/13/2011 1125   GLUCOSE 144 (H) 01/22/2023 0518   GLUCOSE 111 (H) 04/13/2011 1125   BUN 21 01/22/2023 0518   BUN 16 12/07/2022 1535   BUN 14 04/13/2011 1125   CREATININE 0.56 01/22/2023 0518   CREATININE 0.80 08/01/2018 1544    CALCIUM 8.5 (L) 01/22/2023 0518   CALCIUM 9.2 04/13/2011 1125   PROT 6.4 (L) 01/22/2023 0518   PROT 7.6 12/07/2022 1535   PROT 7.8 04/13/2011 1125   ALBUMIN 1.9 (L) 01/22/2023 0518   ALBUMIN 4.4 12/07/2022 1535   ALBUMIN 4.0 04/13/2011 1125   AST 55 (H) 01/22/2023 0518   AST 24 04/13/2011 1125   ALT 124 (H) 01/22/2023 0518   ALT 20 04/13/2011 1125   ALKPHOS 83 01/22/2023 0518   ALKPHOS 51 04/13/2011 1125   BILITOT 0.5 01/22/2023 0518   BILITOT 0.8 12/07/2022 1535   BILITOT 0.6 04/13/2011 1125   GFRNONAA >60 01/22/2023 0518   GFRNONAA 67 08/01/2018 1544   GFRAA 78 08/01/2018 1544   Lipase  No results found for: "LIPASE"     Studies/Results: No results found.   Anti-infectives: Anti-infectives (From admission, onward)    Start     Dose/Rate Route Frequency Ordered Stop   01/17/23 1130  ceFEPIme (MAXIPIME) 2 g in sodium chloride 0.9 % 100 mL IVPB        2 g 200 mL/hr over 30 Minutes Intravenous Every 12 hours 01/17/23 1038 01/24/23 0959   01/16/23 0945  cefTRIAXone (ROCEPHIN) 2 g in sodium chloride 0.9 % 100 mL IVPB  Status:  Discontinued  2 g 200 mL/hr over 30 Minutes Intravenous Every 24 hours 01/16/23 0931 01/17/23 1038        Assessment/Plan Abdominal wall abscess -S/p PEG placement 12/9 Dr. Bedelia Person. PEG dislodged, tract maintained, able to get a foley into the tract. Exchanged for IR PEG 12/16.   -S/p bedside I&D of abdominal wall with penrose drain placement 12/16 by Dr. Janee Morn. S/p bedside I&D of abdominal wall with penrose placement 12/19 by Carlena Bjornstad, PA-C - Tolerating tube feedings at goal. Xray 12/18 confirmed PEG in good location without extravasation. - Cx NGTD - cellulitis remains, WBC stable at 15K. Continue IV abx for cellulitis and allow abdominal wall to continue draining.  FEN - D1 diet, tube feeds at goal VTE - SCD's, sq heparin, DOAC held in the setting of ICH ID - Enterobacter Bacteremia 12/15,  cefepime 12/17>>    - per TRH -   Transaminitis - alk phos and bilirubin are WNL; gallbladder is surgically absent  R SAH  Cerebral aneurysm  HTN emergency Hx ischemic R MCA stroke  A.fib  HLD ODA Osteoarthritis   I reviewed hospitalist notes, last 24 h vitals and pain scores, last 48 h intake and output, and last 24 h labs and trends.    LOS: 9 days    Juliet Rude, [facility] Surgery 01/23/2023, 8:44 AM Please see Amion for pager number during day hours 7:00am-4:30pm

## 2023-01-23 NOTE — Progress Notes (Signed)
Physical Therapy Treatment Patient Details Name: Jocelyn [name] MRN: [id number] DOB: Oct 22, 1931 Today's Date: 01/23/2023   History of Present Illness Pt is an 89 y.o. female who presented to the ED 12/13 due to LUE weakness, left facial droop and CTH showed development of scattered SAH. 12/15 dislodged PEG tube. 12/16 incision and drainage of left abdominal wall infection PEG replaced. 12/19 additional I&D of abdominal wall performed. Recent hospitalization 11/27-12/11 due to R MCA ischemic stroke for which she underwent IR thrombectomy and PEG placement.  She was discharged to [facility] 12/11. PMH: HTN, CHF, a fib with RVR on eliquis, HLD, OSA    PT Comments  Initiated session with bil LE ROM in supine with pt AROM-AAROM for RLE and AAROM-PROM for LLE. Grimacing with initial knee flexion (bil), with some decr discomfort with repeated small ROM. Pt following simple commands with some delay for RLE and longer delay for LLE. Patient assisted to sit EOB with max assist. Pt holding rail with RUE and occasionally leaning on elevated HOB with rt elbow/forearm. Progressed to CGA for up to 10 seconds at a time. Patient initiated return to supine and assisted with max assist.     If plan is discharge home, recommend the following: Two people to help with walking and/or transfers;Direct supervision/assist for medications management;Assist for transportation;Two people to help with bathing/dressing/bathroom;Assistance with cooking/housework;Assistance with feeding;Direct supervision/assist for financial management;Help with stairs or ramp for entrance;Supervision due to cognitive status   Can travel by private vehicle     No  Equipment Recommendations  None recommended by PT    Recommendations for Other Services       Precautions / Restrictions Precautions Precautions: Fall Precaution Comments: PEG tube, abd binder Restrictions Weight Bearing Restrictions Per Provider Order: No     Mobility  Bed  Mobility Overal bed mobility: Needs Assistance Bed Mobility: Supine to Sit, Sit to Supine     Supine to sit: Max assist, Used rails, HOB elevated Sit to supine: Max assist, Used rails   General bed mobility comments: exit to rt side of bed due to pt positioning. pt using RUE on rail to assist with raising torso, yet still max assist overall.    Transfers                   General transfer comment: unable to attempt +! due to poor sitting balance    Ambulation/Gait                   Stairs             Wheelchair Mobility     Tilt Bed    Modified Rankin (Stroke Patients Only) Modified Rankin (Stroke Patients Only) Pre-Morbid Rankin Score: Severe disability Modified Rankin: Severe disability     Balance Overall balance assessment: Needs assistance Sitting-balance support: Feet supported, Single extremity supported Sitting balance-Leahy Scale: Poor Sitting balance - Comments: mod assist initially due to posterior lean; progressed to CGA with RUE on rail and rt lean (pt propping on rt elbow at times); EOB ~4 minutes working toward midline with pt then initiating return to supine                                    Cognition Arousal: Alert Behavior During Therapy: Flat affect Overall Cognitive Status: No family/caregiver present to determine baseline cognitive functioning Area of Impairment: Attention, Following commands, Memory, Safety/judgement, Awareness, Problem  solving                   Current Attention Level: Focused Memory: Decreased recall of precautions, Decreased short-term memory Following Commands: Follows one step commands inconsistently Safety/Judgement: Decreased awareness of safety, Decreased awareness of deficits Awareness: Intellectual Problem Solving: Slow processing, Decreased initiation, Difficulty sequencing, Requires verbal cues, Requires tactile cues General Comments: Pt with decreased attention  (sometimes ?related to [facility]?).        Exercises General Exercises - Lower Extremity Ankle Circles/Pumps: AROM, Right, AAROM, Left, 10 reps, Supine Heel Slides: AAROM, Both, 5 reps, Supine Straight Leg Raises: AAROM, Right, PROM, Left, 5 reps    General Comments        Pertinent Vitals/Pain Pain Assessment Pain Assessment: Faces Faces Pain Scale: Hurts even more Pain Location: bilateral knees Pain Descriptors / Indicators: Grimacing, Discomfort Pain Intervention(s): Limited activity within patient's tolerance, Repositioned    Home Living                          Prior Function            PT Goals (current goals can now be found in the care plan section) Acute Rehab PT Goals Patient Stated Goal: not stated Time For Goal Achievement: 01/29/23 Potential to Achieve Goals: Fair Progress towards PT goals: Progressing toward goals    Frequency    Min 1X/week      PT Plan      Co-evaluation              AM-PAC PT "6 Clicks" Mobility   Outcome Measure  Help needed turning from your back to your side while in a flat bed without using bedrails?: A Lot Help needed moving from lying on your back to sitting on the side of a flat bed without using bedrails?: Total Help needed moving to and from a bed to a chair (including a wheelchair)?: Total Help needed standing up from a chair using your arms (e.g., wheelchair or bedside chair)?: Total Help needed to walk in hospital room?: Total Help needed climbing 3-5 steps with a railing? : Total 6 Click Score: 7    End of Session   Activity Tolerance: Patient limited by fatigue Patient left: with call bell/phone within reach;with bed alarm set;in bed (Heels floating HOB >30 deg) Nurse Communication: Mobility status PT Visit Diagnosis: Other abnormalities of gait and mobility (R26.89);Hemiplegia and hemiparesis;Other symptoms and signs involving the nervous system (R29.898) Hemiplegia - Right/Left:  Left Hemiplegia - dominant/non-dominant: Non-dominant Hemiplegia - caused by: Nontraumatic intracerebral hemorrhage     Time: 1149-1206 PT Time Calculation (min) (ACUTE ONLY): 17 min  Charges:    $Therapeutic Activity: 8-22 mins PT General Charges $$ ACUTE PT VISIT: 1 Visit                      Jerolyn Center, PT Acute Rehabilitation Services  Office [phone]    Zena Amos 01/23/2023, 12:23 PM

## 2023-01-23 NOTE — Progress Notes (Signed)
Jocelyn [name]  VHQ:[id number] DOB: April 06, 1931 DOA: 01/14/2023 PCP: Gorden Harms, PA-C    Brief Narrative:  89 year old with a history of OA bilateral knees, HTN, GERD, chronic iron deficiency anemia, HLD, OSA on CPAP, pre-DM, and chronic atrial fibrillation on Eliquis who was discharged from the stroke service at [facility] 12/11 after management of an acute right MCA territory infarct related to her atrial fibrillation.  During that preceding admission she presented with left-sided weakness and was found to have a right A1 and M2 occlusion for which she underwent successful mechanical thrombectomy/revascularization.  She was ultimately discharged to a SNF.  She returned to the ER at [facility] from her rehab facility on 12/14 after developing a new left-sided facial droop and left arm numbness with weakness.  CT head at presentation revealed interval development of scattered subarachnoid hemorrhage in the right sylvian fissure.  Her Eliquis was reversed with Kcentra.  CTa revealed no LVO but atheromatous plaque with outpouchings extending posteriorly and inferiorly from both supraclinoid ICAs possibly representing small aneurysms.  Significant Events:  12/14 Admitted with [facility] 12/15 dislodged PEG tube 12/15 long term EEG w/o evidence of seizure activity  12/16 transferred out of the ICU, incision and drainage of left abdominal wall infection. 12/17 blood cultures positive for Enterobacter cloacae, cefepime started 12/19 additional I&D of abdominal wall performed 12/21 [facility] assumed care of patient  Goals of Care:   Code Status: Full Code   DVT prophylaxis: heparin injection 5,000 Units Start: 01/17/23 1400 SCD's Start: 01/14/23 0129   Interim Hx: No acute events recorded overnight.  Afebrile.  Vital signs stable.  Appears to be resting comfortably in bed.  I spoke with family at bedside explaining our intended course of treatment.  Assessment & Plan:  Subarachnoid hemorrhage - possible  cerebral aneurysm Noted at time of this readmission - occurred while on Eliquis therapy - imaging has suggested outpouchings extending posteriorly and inferiorly from both supraclinoid ICAs which could reflect small aneurysms - these were noted on CTa as well as MRa - yearly MRa is recommended in follow-up by the Stroke Team  Recent CVA Admitted to the hospital 11/28-12/12/2022 for treatment of a right MCA territory infarct felt to be cardioembolic due to A-fib - an acute clot was noted at the right terminal ICA and the patient underwent intervention with subsequent revascularization  Abdominal wall infection General Surgery has been following with I&D performed 12/16 with Penrose drain placement and again 12/19 with placement of a second Penrose drain - continue antibiotic therapy - care per General Surgery  Enterobacter bacteremia Noted on 1 of 2 blood cultures drawn 12/15 - third-generation cephalosporin and Zosyn resistant - will complete 7 full days of cefepime therapy, discontinue antibiotics, and recheck blood cultures the following day  Dysphagia due to stroke PEG tube originally placed 12/9 during prior admission - this became dislodged 12/15 with the tract temporarily preserved using a Foley catheter which was subsequently exchanged for a replacement PEG tube in IR 12/16  Abnormal EEG Routine EEG 12/14 raised the question of cortical dysfunction arising from the right hemisphere but with no evidence of seizure activity - long-term EEG revealed no evidence of active seizure activity  Hypertensive emergency Blood pressure presently well-controlled  Chronic atrial fibrillation Eliquis on hold due to [facility] - rate controlled  Transaminitis Statin on hold - LFTs steadily improving - follow intermittently  Recent Labs  Lab 01/18/23 0554 01/19/23 0613 01/20/23 0401 01/21/23 0803 01/22/23 0518  AST 332* 189* 110*  73* 55*  ALT 375* 302* 216* 162* 124*  ALKPHOS 111 100 88 83 83   BILITOT 0.6 0.7 0.6 0.5 0.5  PROT 6.5 6.4* 6.2* 6.3* 6.4*  ALBUMIN 1.9* 1.9* 1.8* 1.9* 1.9*    HLD LDL 33 - statin discontinued 12/17 due to elevated LFTs  Family Communication: Spoke with granddaughter at bedside and explained intended course of treatment Disposition: Will need SNF placement -suspect patient will be medically stable for discharge in approximately 48 hours   Objective: Blood pressure (!) 142/64, pulse 94, temperature 97.9 F (36.6 C), temperature source Oral, resp. rate 18, height 5\' 3"  (1.6 m), weight 69 kg, SpO2 99%.  Intake/Output Summary (Last 24 hours) at 01/23/2023 0848 Last data filed at 01/23/2023 0259 Gross per 24 hour  Intake 1864.44 ml  Output 1650 ml  Net 214.44 ml   Filed Weights   01/19/23 0432 01/19/23 1300 01/21/23 0500  Weight: 67.9 kg 67.9 kg 69 kg    Examination: General: No acute respiratory distress Lungs: Clear to auscultation bilaterally -no wheezing Cardiovascular: Regular rate and rhythm without murmur gallop or rub normal S1 and S2 Abdomen: Abdominal wounds dressed with abdominal binder in place, bowel sounds positive, no rebound Extremities: No significant edema bilateral lower extremities  CBC: Recent Labs  Lab 01/21/23 0803 01/22/23 0518 01/23/23 0507  WBC 17.2* 15.7* 15.3*  HGB 9.1* 8.8* 8.9*  HCT 28.0* 27.0* 27.8*  MCV 84.3 83.6 84.5  PLT 214 251 189   Basic Metabolic Panel: Recent Labs  Lab 01/16/23 1230 01/17/23 0439 01/18/23 0554 01/19/23 0613 01/20/23 0401 01/21/23 0803 01/22/23 0518  NA 138   < > 136   < > 133* 135 133*  K 4.6   < > 4.8   < > 4.5 4.7 4.9  CL 110   < > 111   < > 108 107 104  CO2 19*   < > 19*   < > 19* 19* 23  GLUCOSE 113*   < > 155*   < > 150* 137* 144*  BUN 38*   < > 31*   < > 21 26* 21  CREATININE 0.69   < > 0.79   < > 0.70 0.78 0.56  CALCIUM 8.7*   < > 8.2*   < > 8.5* 8.5* 8.5*  MG 2.6*  --  2.2  --   --   --  1.8  PHOS  --   --   --   --   --   --  2.8   < > = values in this  interval not displayed.   GFR: Estimated Creatinine Clearance: 44.4 mL/min (by C-G formula based on SCr of 0.56 mg/dL).   Scheduled Meds:  amLODipine  10 mg Per Tube Daily   calcium-vitamin D  1 tablet Oral BID   Chlorhexidine Gluconate Cloth  6 each Topical Daily   famotidine  20 mg Oral BID   ferrous sulfate  60 mg of iron Per Tube Daily   fiber supplement (BANATROL TF)  60 mL Per Tube BID   free water  200 mL Per Tube Q4H   heparin injection (subcutaneous)  5,000 Units Subcutaneous Q8H   insulin aspart  0-15 Units Subcutaneous Q8H   lidocaine  2 patch Transdermal Daily   liver oil-zinc oxide   Topical TID   mouth rinse  15 mL Mouth Rinse 4 times per day   Continuous Infusions:  ceFEPime (MAXIPIME) IV Stopped (01/22/23 2233)   feeding supplement (OSMOLITE  1.2 CAL) 55 mL/hr at 01/23/23 0259     LOS: 9 days   Lonia Blood, MD [facility] Office  [phone] Pager - Text Page per Amion  If 7PM-7AM, please contact night-coverage per Amion 01/23/2023, 8:48 AM

## 2023-01-24 ENCOUNTER — Inpatient Hospital Stay (HOSPITAL_COMMUNITY): Payer: 59

## 2023-01-24 DIAGNOSIS — I619 Nontraumatic intracerebral hemorrhage, unspecified: Secondary | ICD-10-CM | POA: Diagnosis not present

## 2023-01-24 LAB — CBC
HCT: 27.3 % — ABNORMAL LOW (ref 36.0–46.0)
Hemoglobin: 9 g/dL — ABNORMAL LOW (ref 12.0–15.0)
MCH: 27.2 pg (ref 26.0–34.0)
MCHC: 33 g/dL (ref 30.0–36.0)
MCV: 82.5 fL (ref 80.0–100.0)
Platelets: 211 10*3/uL (ref 150–400)
RBC: 3.31 MIL/uL — ABNORMAL LOW (ref 3.87–5.11)
RDW: 15.3 % (ref 11.5–15.5)
WBC: 14.2 10*3/uL — ABNORMAL HIGH (ref 4.0–10.5)
nRBC: 0 % (ref 0.0–0.2)

## 2023-01-24 LAB — COMPREHENSIVE METABOLIC PANEL
ALT: 87 U/L — ABNORMAL HIGH (ref 0–44)
AST: 51 U/L — ABNORMAL HIGH (ref 15–41)
Albumin: 1.9 g/dL — ABNORMAL LOW (ref 3.5–5.0)
Alkaline Phosphatase: 79 U/L (ref 38–126)
Anion gap: 6 (ref 5–15)
BUN: 20 mg/dL (ref 8–23)
CO2: 23 mmol/L (ref 22–32)
Calcium: 8.7 mg/dL — ABNORMAL LOW (ref 8.9–10.3)
Chloride: 102 mmol/L (ref 98–111)
Creatinine, Ser: 0.64 mg/dL (ref 0.44–1.00)
GFR, Estimated: >60 mL/min (ref >60)
Glucose, Bld: 153 mg/dL — ABNORMAL HIGH (ref 70–99)
Potassium: 5.1 mmol/L (ref 3.5–5.1)
Sodium: 131 mmol/L — ABNORMAL LOW (ref 135–145)
Total Bilirubin: 0.6 mg/dL (ref <1.2)
Total Protein: 6.9 g/dL (ref 6.5–8.1)

## 2023-01-24 LAB — GLUCOSE, CAPILLARY
Glucose-Capillary: 140 mg/dL — ABNORMAL HIGH (ref 70–99)
Glucose-Capillary: 158 mg/dL — ABNORMAL HIGH (ref 70–99)
Glucose-Capillary: 159 mg/dL — ABNORMAL HIGH (ref 70–99)

## 2023-01-24 LAB — MAGNESIUM: Magnesium: 1.9 mg/dL (ref 1.7–2.4)

## 2023-01-24 LAB — PHOSPHORUS: Phosphorus: 3.5 mg/dL (ref 2.5–4.6)

## 2023-01-24 MED ORDER — SODIUM CHLORIDE 0.9 % IV SOLN
1.0000 g | Freq: Two times a day (BID) | INTRAVENOUS | Status: DC
  Administered 2023-01-24 – 2023-02-01 (×16): 1 g via INTRAVENOUS
  Filled 2023-01-24 (×17): qty 20

## 2023-01-24 MED ORDER — CIPROFLOXACIN HCL 250 MG PO TABS
500.0000 mg | ORAL_TABLET | Freq: Two times a day (BID) | ORAL | Status: DC
  Filled 2023-01-24: qty 2

## 2023-01-24 MED ORDER — SODIUM CHLORIDE 0.9 % IV SOLN
500.0000 mg | INTRAVENOUS | Status: DC
  Administered 2023-01-25 – 2023-01-29 (×6): 500 mg via INTRAVENOUS
  Filled 2023-01-24 (×7): qty 5

## 2023-01-24 NOTE — Progress Notes (Signed)
Jocelyn [name]  VFI:[id number] DOB: 22-Feb-1931 DOA: 01/14/2023 PCP: Gorden Harms, PA-C    Brief Narrative:  89 year old with a history of OA bilateral knees, HTN, GERD, chronic iron deficiency anemia, HLD, OSA on CPAP, pre-DM, and chronic atrial fibrillation on Eliquis who was discharged from the Stroke Service at [facility] 12/11 after management of an acute right MCA territory infarct related to her atrial fibrillation.  During that preceding admission she presented with left-sided weakness and was found to have a right A1 and M2 occlusion for which she underwent successful mechanical thrombectomy/revascularization.  She was ultimately discharged to a SNF.  She returned to the ER at [facility] from her rehab facility on 12/14 after developing a new left-sided facial droop and left arm numbness with weakness.  CT head at presentation revealed interval development of scattered subarachnoid hemorrhage in the right sylvian fissure.  Her Eliquis was reversed with Kcentra.  CTa revealed no LVO but atheromatous plaque with outpouchings extending posteriorly and inferiorly from both supraclinoid ICAs possibly representing small aneurysms.  Significant Events:  12/14 Admitted with [facility] 12/15 dislodged PEG tube 12/15 long term EEG w/o evidence of seizure activity  12/16 transferred out of the ICU, incision and drainage of left abdominal wall infection. 12/17 blood cultures positive for Enterobacter cloacae, cefepime started 12/19 additional I&D of abdominal wall performed 12/21 [facility] assumed care of patient  Goals of Care:   Code Status: Full Code   DVT prophylaxis: heparin injection 5,000 Units Start: 01/17/23 1400 SCD's Start: 01/14/23 0129   Interim Hx: Afebrile.  Vital signs stable.  WBC slowly trending downward.  Resting comfortably in bed.  No complaints.  No family present at time of exam.  No evidence of respiratory distress.  Assessment & Plan:  Subarachnoid hemorrhage - possible cerebral  aneurysm Noted at time of this readmission - occurred while on Eliquis therapy - imaging has suggested outpouchings extending posteriorly and inferiorly from both supraclinoid ICAs which could reflect small aneurysms - these were noted on CTa as well as MRa - yearly MRa is recommended in follow-up by the Stroke Team  Recent CVA Admitted to the hospital 11/28-12/12/2022 for treatment of a right MCA territory infarct felt to be cardioembolic due to A-fib - an acute clot was noted at the right terminal ICA and the patient underwent intervention with subsequent revascularization  Abdominal wall infection General Surgery has been following with I&D performed 12/16 with Penrose drain placement and again 12/19 with placement of a second Penrose drain -wound care per General Surgery - IV antibiotic therapy completed yesterday for combined indication of bacteremia -transition to oral antibiotic today to provide ongoing coverage of other potential pathogens for abdominal wall infection  Enterobacter bacteremia Noted on 1 of 2 blood cultures drawn 12/15 - third-generation cephalosporin and Zosyn resistant - completed 7 full days of cefepime therapy -recheck of blood cultures this morning off antibiotic therapy to be followed by resumption of oral antibiotic therapy for abdominal wall infection  Dysphagia due to stroke PEG tube originally placed 12/9 during prior admission - this became dislodged 12/15 with the tract temporarily preserved using a Foley catheter which was subsequently exchanged for a replacement PEG tube in IR 12/16  Abnormal EEG Routine EEG 12/14 raised the question of cortical dysfunction arising from the right hemisphere but with no evidence of seizure activity - long-term EEG revealed no evidence of active seizure activity  Hypertensive emergency Blood pressure presently well-controlled  Chronic atrial fibrillation Eliquis discontinued due to [facility] -  rate  controlled  Transaminitis Statin on hold - LFTs steadily improving - follow intermittently and resume statin once normalized  Recent Labs  Lab 01/19/23 0613 01/20/23 0401 01/21/23 0803 01/22/23 0518 01/24/23 0556  AST 189* 110* 73* 55* 51*  ALT 302* 216* 162* 124* 87*  ALKPHOS 100 88 83 83 79  BILITOT 0.7 0.6 0.5 0.5 0.6  PROT 6.4* 6.2* 6.3* 6.4* 6.9  ALBUMIN 1.9* 1.8* 1.9* 1.9* 1.9*    HLD LDL 33 - statin discontinued 12/17 due to elevated LFTs  Family Communication: No family present at time of exam Disposition: Will need SNF placement -suspect patient will be medically stable for discharge in approximately 24 hours   Objective: Blood pressure (!) 156/67, pulse 98, temperature 98.9 F (37.2 C), temperature source Oral, resp. rate 16, height 5\' 3"  (1.6 m), weight 69 kg, SpO2 99%.  Intake/Output Summary (Last 24 hours) at 01/24/2023 0845 Last data filed at 01/24/2023 0153 Gross per 24 hour  Intake 1306.35 ml  Output 1050 ml  Net 256.35 ml   Filed Weights   01/19/23 0432 01/19/23 1300 01/21/23 0500  Weight: 67.9 kg 67.9 kg 69 kg    Examination: General: No acute respiratory distress Lungs: Clear to auscultation bilaterally -no wheezing Cardiovascular: Regular rate and rhythm without murmur gallop or rub normal S1 and S2 Abdomen: Abdominal wounds dressed - abdominal binder in place, bowel sounds positive, no rebound Extremities: No significant edema B LE  CBC: Recent Labs  Lab 01/22/23 0518 01/23/23 0507 01/24/23 0556  WBC 15.7* 15.3* 14.2*  HGB 8.8* 8.9* 9.0*  HCT 27.0* 27.8* 27.3*  MCV 83.6 84.5 82.5  PLT 251 189 211   Basic Metabolic Panel: Recent Labs  Lab 01/18/23 0554 01/19/23 0613 01/21/23 0803 01/22/23 0518 01/24/23 0556  NA 136   < > 135 133* 131*  K 4.8   < > 4.7 4.9 5.1  CL 111   < > 107 104 102  CO2 19*   < > 19* 23 23  GLUCOSE 155*   < > 137* 144* 153*  BUN 31*   < > 26* 21 20  CREATININE 0.79   < > 0.78 0.56 0.64  CALCIUM 8.2*    < > 8.5* 8.5* 8.7*  MG 2.2  --   --  1.8 1.9  PHOS  --   --   --  2.8 3.5   < > = values in this interval not displayed.   GFR: Estimated Creatinine Clearance: 44.4 mL/min (by C-G formula based on SCr of 0.64 mg/dL).   Scheduled Meds:  amLODipine  10 mg Per Tube Daily   calcium-vitamin D  1 tablet Oral BID   Chlorhexidine Gluconate Cloth  6 each Topical Daily   famotidine  20 mg Oral BID   ferrous sulfate  60 mg of iron Per Tube Daily   fiber supplement (BANATROL TF)  60 mL Per Tube BID   free water  200 mL Per Tube Q4H   heparin injection (subcutaneous)  5,000 Units Subcutaneous Q8H   insulin aspart  0-15 Units Subcutaneous Q8H   lidocaine  2 patch Transdermal Daily   liver oil-zinc oxide   Topical TID   mouth rinse  15 mL Mouth Rinse 4 times per day   Continuous Infusions:  feeding supplement (OSMOLITE 1.2 CAL) 55 mL/hr at 01/23/23 1743     LOS: 10 days   Lonia Blood, MD [facility] Office  [phone] Pager - Text Page per Loretha Stapler  If 7PM-7AM, please contact night-coverage per Amion 01/24/2023, 8:45 AM

## 2023-01-24 NOTE — Progress Notes (Addendum)
[facility] Progress Note     Subjective: No complaints. No family at bedside this AM.   Objective: Vital signs in last 24 hours: Temp:  [98.3 F (36.8 C)-100.1 F (37.8 C)] 98.9 F (37.2 C) (12/24 0801) Pulse Rate:  [92-105] 98 (12/24 0801) Resp:  [16-18] 16 (12/24 0801) BP: (125-158)/(49-70) 156/67 (12/24 0801) SpO2:  [98 %-100 %] 99 % (12/24 0801) FiO2 (%):  [21 %] 21 % (12/24 0005) Last BM Date : 01/23/23  Intake/Output from previous day: 12/23 0701 - 12/24 0700 In: 1306.4 [P.O.:297; NG/GT:810.3; IV Piggyback:199] Out: 2150 [Urine:2150] Intake/Output this shift: No intake/output data recorded.  PE: Gen:  Alert, NAD, pleasant Pulm: normal work of breathing on room air Abd: soft, ND, +BS, PEG site with some purulent drainage on dressing, LLQ I&D site with penrose drain present and scant SS fluid on dressing, medial I&D site closer to the PEG tube with scant purulent drainage. There is minimal cellulitis and induration now today present without palpable residual fluctuance . Applying pressure between the peg tube and the more medial drain elicits purulence.     Lab Results:  Recent Labs    01/23/23 0507 01/24/23 0556  WBC 15.3* 14.2*  HGB 8.9* 9.0*  HCT 27.8* 27.3*  PLT 189 211   BMET Recent Labs    01/22/23 0518 01/24/23 0556  NA 133* 131*  K 4.9 5.1  CL 104 102  CO2 23 23  GLUCOSE 144* 153*  BUN 21 20  CREATININE 0.56 0.64  CALCIUM 8.5* 8.7*   PT/INR No results for input(s): "LABPROT", "INR" in the last 72 hours. CMP     Component Value Date/Time   NA 131 (L) 01/24/2023 0556   NA 143 12/07/2022 1535   NA 141 04/13/2011 1125   K 5.1 01/24/2023 0556   K 3.4 (L) 04/13/2011 1125   CL 102 01/24/2023 0556   CL 105 04/13/2011 1125   CO2 23 01/24/2023 0556   CO2 29 04/13/2011 1125   GLUCOSE 153 (H) 01/24/2023 0556   GLUCOSE 111 (H) 04/13/2011 1125   BUN 20 01/24/2023 0556   BUN 16 12/07/2022 1535   BUN 14 04/13/2011 1125    CREATININE 0.64 01/24/2023 0556   CREATININE 0.80 08/01/2018 1544   CALCIUM 8.7 (L) 01/24/2023 0556   CALCIUM 9.2 04/13/2011 1125   PROT 6.9 01/24/2023 0556   PROT 7.6 12/07/2022 1535   PROT 7.8 04/13/2011 1125   ALBUMIN 1.9 (L) 01/24/2023 0556   ALBUMIN 4.4 12/07/2022 1535   ALBUMIN 4.0 04/13/2011 1125   AST 51 (H) 01/24/2023 0556   AST 24 04/13/2011 1125   ALT 87 (H) 01/24/2023 0556   ALT 20 04/13/2011 1125   ALKPHOS 79 01/24/2023 0556   ALKPHOS 51 04/13/2011 1125   BILITOT 0.6 01/24/2023 0556   BILITOT 0.8 12/07/2022 1535   BILITOT 0.6 04/13/2011 1125   GFRNONAA >60 01/24/2023 0556   GFRNONAA 67 08/01/2018 1544   GFRAA 78 08/01/2018 1544   Lipase  No results found for: "LIPASE"     Studies/Results: No results found.   Anti-infectives: Anti-infectives (From admission, onward)    Start     Dose/Rate Route Frequency Ordered Stop   01/25/23 0800  ciprofloxacin (CIPRO) 500 MG/5ML (10%) suspension 500 mg        500 mg Oral 2 times daily 01/24/23 0850     01/17/23 1130  ceFEPIme (MAXIPIME) 2 g in sodium chloride 0.9 % 100 mL IVPB  2 g 200 mL/hr over 30 Minutes Intravenous Every 12 hours 01/17/23 1038 01/24/23 0355   01/16/23 0945  cefTRIAXone (ROCEPHIN) 2 g in sodium chloride 0.9 % 100 mL IVPB  Status:  Discontinued        2 g 200 mL/hr over 30 Minutes Intravenous Every 24 hours 01/16/23 0931 01/17/23 1038        Assessment/Plan Abdominal wall abscess -S/p PEG placement 12/9 Dr. Bedelia Person. PEG dislodged, tract maintained, able to get a foley into the tract. Exchanged for IR PEG 12/16.   -S/p bedside I&D of abdominal wall with penrose drain placement 12/16 by Dr. Janee Morn. S/p bedside I&D of abdominal wall with penrose placement 12/19 by Carlena Bjornstad, PA-C - Tolerating tube feedings at goal. Xray 12/18 confirmed PEG in good location without extravasation. - Cx NGTD - cellulitis remains, WBC down-trending now. Continue IV abx for cellulitis and allow abdominal  wall to continue draining.  FEN - D1 diet, tube feeds at goal VTE - SCD's, sq heparin, DOAC held in the setting of ICH ID - Enterobacter Bacteremia 12/15,  cefepime 12/17>>    - per [facility] -  Transaminitis - alk phos and bilirubin are WNL; gallbladder is surgically absent  R SAH  Cerebral aneurysm  HTN emergency Hx ischemic R MCA stroke  A.fib  HLD ODA Osteoarthritis   I reviewed hospitalist notes, last 24 h vitals and pain scores, last 48 h intake and output, and last 24 h labs and trends.   LOS: 10 days   Marin Olp, MD [facility]

## 2023-01-24 NOTE — Significant Event (Addendum)
[facility] night coverage note:  Pt with some tachypnea + diminished breath sounds.  Obtained CXR which now seems to show a developing PNA.  Check covid, flu, and RSV Will adjust ABx given PNA developing despite Cipro: ABx to cover both her enterobacter + PNA organisms: ? AmpC producer?  Sensitivity to cefepime "real"? D/W [place] RPH: Reasonable to do Merrem + azithro for the moment.

## 2023-01-24 NOTE — Plan of Care (Signed)
Problem: Education: Goal: Knowledge of General Education information will improve Description: Including pain rating scale, medication(s)/side effects and non-pharmacologic comfort measures 01/24/2023 1900 by Elba Barman, RN Outcome: Progressing 01/24/2023 1858 by Elba Barman, RN Outcome: Progressing   Problem: Health Behavior/Discharge Planning: Goal: Ability to manage health-related needs will improve 01/24/2023 1900 by Elba Barman, RN Outcome: Progressing 01/24/2023 1858 by Elba Barman, RN Outcome: Progressing   Problem: Clinical Measurements: Goal: Ability to maintain clinical measurements within normal limits will improve 01/24/2023 1900 by Elba Barman, RN Outcome: Progressing 01/24/2023 1858 by Elba Barman, RN Outcome: Progressing Goal: Will remain free from infection 01/24/2023 1900 by Elba Barman, RN Outcome: Progressing 01/24/2023 1858 by Elba Barman, RN Outcome: Progressing Goal: Diagnostic test results will improve 01/24/2023 1900 by Elba Barman, RN Outcome: Progressing 01/24/2023 1858 by Elba Barman, RN Outcome: Progressing Goal: Respiratory complications will improve 01/24/2023 1900 by Elba Barman, RN Outcome: Progressing 01/24/2023 1858 by Elba Barman, RN Outcome: Progressing Goal: Cardiovascular complication will be avoided 01/24/2023 1900 by Elba Barman, RN Outcome: Progressing 01/24/2023 1858 by Elba Barman, RN Outcome: Progressing   Problem: Activity: Goal: Risk for activity intolerance will decrease 01/24/2023 1900 by Elba Barman, RN Outcome: Progressing 01/24/2023 1858 by Elba Barman, RN Outcome: Progressing   Problem: Nutrition: Goal: Adequate nutrition will be maintained 01/24/2023 1900 by Elba Barman, RN Outcome: Progressing 01/24/2023 1858 by Elba Barman, RN Outcome: Progressing   Problem: Coping: Goal: Level of anxiety will decrease 01/24/2023 1900 by Elba Barman, RN Outcome:  Progressing 01/24/2023 1858 by Elba Barman, RN Outcome: Progressing   Problem: Elimination: Goal: Will not experience complications related to bowel motility 01/24/2023 1900 by Elba Barman, RN Outcome: Progressing 01/24/2023 1858 by Elba Barman, RN Outcome: Progressing Goal: Will not experience complications related to urinary retention 01/24/2023 1900 by Elba Barman, RN Outcome: Progressing 01/24/2023 1858 by Elba Barman, RN Outcome: Progressing   Problem: Pain Management: Goal: General experience of comfort will improve 01/24/2023 1900 by Elba Barman, RN Outcome: Progressing 01/24/2023 1858 by Elba Barman, RN Outcome: Progressing   Problem: Safety: Goal: Ability to remain free from injury will improve 01/24/2023 1900 by Elba Barman, RN Outcome: Progressing 01/24/2023 1858 by Elba Barman, RN Outcome: Progressing   Problem: Skin Integrity: Goal: Risk for impaired skin integrity will decrease 01/24/2023 1900 by Elba Barman, RN Outcome: Progressing 01/24/2023 1858 by Elba Barman, RN Outcome: Progressing   Problem: Education: Goal: Ability to describe self-care measures that may prevent or decrease complications (Diabetes Survival Skills Education) will improve 01/24/2023 1900 by Elba Barman, RN Outcome: Progressing 01/24/2023 1858 by Elba Barman, RN Outcome: Progressing Goal: Individualized Educational Video(s) 01/24/2023 1900 by Elba Barman, RN Outcome: Progressing 01/24/2023 1858 by Elba Barman, RN Outcome: Progressing   Problem: Coping: Goal: Ability to adjust to condition or change in health will improve 01/24/2023 1900 by Elba Barman, RN Outcome: Progressing 01/24/2023 1858 by Elba Barman, RN Outcome: Progressing   Problem: Fluid Volume: Goal: Ability to maintain a balanced intake and output will improve 01/24/2023 1900 by Elba Barman, RN Outcome: Progressing 01/24/2023 1858 by Elba Barman,  RN Outcome: Progressing   Problem: Health Behavior/Discharge Planning: Goal: Ability to identify and utilize available resources and services will improve 01/24/2023 1900 by Elba Barman, RN Outcome: Progressing 01/24/2023 1858 by Elba Barman,  RN Outcome: Progressing Goal: Ability to manage health-related needs will improve 01/24/2023 1900 by Elba Barman, RN Outcome: Progressing 01/24/2023 1858 by Elba Barman, RN Outcome: Progressing   Problem: Metabolic: Goal: Ability to maintain appropriate glucose levels will improve 01/24/2023 1900 by Elba Barman, RN Outcome: Progressing 01/24/2023 1858 by Elba Barman, RN Outcome: Progressing   Problem: Nutritional: Goal: Maintenance of adequate nutrition will improve 01/24/2023 1900 by Elba Barman, RN Outcome: Progressing 01/24/2023 1858 by Elba Barman, RN Outcome: Progressing Goal: Progress toward achieving an optimal weight will improve 01/24/2023 1900 by Elba Barman, RN Outcome: Progressing 01/24/2023 1858 by Elba Barman, RN Outcome: Progressing   Problem: Skin Integrity: Goal: Risk for impaired skin integrity will decrease 01/24/2023 1900 by Elba Barman, RN Outcome: Progressing 01/24/2023 1858 by Elba Barman, RN Outcome: Progressing   Problem: Tissue Perfusion: Goal: Adequacy of tissue perfusion will improve 01/24/2023 1900 by Elba Barman, RN Outcome: Progressing 01/24/2023 1858 by Elba Barman, RN Outcome: Progressing   Problem: Education: Goal: Knowledge of disease or condition will improve 01/24/2023 1900 by Elba Barman, RN Outcome: Progressing 01/24/2023 1858 by Elba Barman, RN Outcome: Progressing Goal: Knowledge of secondary prevention will improve (MUST DOCUMENT ALL) 01/24/2023 1900 by Elba Barman, RN Outcome: Progressing 01/24/2023 1858 by Elba Barman, RN Outcome: Progressing Goal: Knowledge of patient specific risk factors will improve Loraine Leriche N/A or DELETE  if not current risk factor) 01/24/2023 1900 by Elba Barman, RN Outcome: Progressing 01/24/2023 1858 by Elba Barman, RN Outcome: Progressing   Problem: Intracerebral Hemorrhage Tissue Perfusion: Goal: Complications of Intracerebral Hemorrhage will be minimized 01/24/2023 1900 by Elba Barman, RN Outcome: Progressing 01/24/2023 1858 by Elba Barman, RN Outcome: Progressing   Problem: Coping: Goal: Will verbalize positive feelings about self 01/24/2023 1900 by Elba Barman, RN Outcome: Progressing 01/24/2023 1858 by Elba Barman, RN Outcome: Progressing Goal: Will identify appropriate support needs 01/24/2023 1900 by Elba Barman, RN Outcome: Progressing 01/24/2023 1858 by Elba Barman, RN Outcome: Progressing   Problem: Health Behavior/Discharge Planning: Goal: Ability to manage health-related needs will improve 01/24/2023 1900 by Elba Barman, RN Outcome: Progressing 01/24/2023 1858 by Elba Barman, RN Outcome: Progressing Goal: Goals will be collaboratively established with patient/family 01/24/2023 1900 by Elba Barman, RN Outcome: Progressing 01/24/2023 1858 by Elba Barman, RN Outcome: Progressing   Problem: Self-Care: Goal: Ability to participate in self-care as condition permits will improve 01/24/2023 1900 by Elba Barman, RN Outcome: Progressing 01/24/2023 1858 by Elba Barman, RN Outcome: Progressing Goal: Verbalization of feelings and concerns over difficulty with self-care will improve 01/24/2023 1900 by Elba Barman, RN Outcome: Progressing 01/24/2023 1858 by Elba Barman, RN Outcome: Progressing Goal: Ability to communicate needs accurately will improve 01/24/2023 1900 by Elba Barman, RN Outcome: Progressing 01/24/2023 1858 by Elba Barman, RN Outcome: Progressing   Problem: Nutrition: Goal: Risk of aspiration will decrease 01/24/2023 1900 by Elba Barman, RN Outcome: Progressing 01/24/2023 1858 by Elba Barman, RN Outcome: Progressing Goal: Dietary intake will improve 01/24/2023 1900 by Elba Barman, RN Outcome: Progressing 01/24/2023 1858 by Elba Barman, RN Outcome: Progressing

## 2023-01-24 NOTE — Progress Notes (Signed)
Nutrition Follow-up  DOCUMENTATION CODES:   Non-severe (moderate) malnutrition in context of chronic illness  INTERVENTION:  Osmolite 1.2 at 55 ml/h. Provides 1584 kcal, 73 gm protein, 1082 ml free water daily.   For free water flushes, recommend 125 ml every 6 hours for a total of 1582 ml free water daily. Continue to encourage oral intake of DYS 1 diet.   NUTRITION DIAGNOSIS:   Moderate Malnutrition related to chronic illness (stroke, dysphagia) as evidenced by mild fat depletion, moderate fat depletion, moderate muscle depletion, severe muscle depletion.    GOAL:   Patient will meet greater than or equal to 90% of their needs    MONITOR:   TF tolerance, Diet advancement, Labs, Weight trends  REASON FOR ASSESSMENT:   Consult Enteral/tube feeding initiation and management  ASSESSMENT:   89 yo female with L sided weakness and confusion with SAH. Pt with recent discharge from the hospital after admission for acute R MCA infarct. Pt with hx of GERD, HTN. Pt has PEG tube in place  Review of EMR revealed IR PEG replaced 12/16, X ray confirmed PEG on good placement. Tolerating feedings at goal rate. Pt pending SNF placement.  SLP up graded diet to DYS 1 thin liquids, 12/17, with good acceptance and tolerance.  Hospital weight history: 01/21/23 0500 69 kg 152.12 lbs  01/19/23 1300 67.9 kg 149.69 lbs  01/19/23 0432 67.9 kg 149.69 lbs  01/18/23 0500 65.6 kg 144.62 lbs  01/17/23 0500 64.3 kg 141.76 lbs  01/16/23 0500 63.2 kg 139.33 lbs  01/15/23 0500 66.2 kg 145.94 lbs  01/14/23 0112 64.3 kg 141.76 lbs      Average Meal Intake: 25-100: 67.5% intake x 8 recorded meals  Nutritionally Relevant Medications: Scheduled Meds:  amLODipine  10 mg Per Tube Daily   calcium-vitamin D  1 tablet Oral BID   ferrous sulfate  60 mg of iron Per Tube Daily   fiber supplement (BANATROL TF)  60 mL Per Tube BID   free water  200 mL Per Tube Q4H   liver oil-zinc oxide   Topical TID     Continuous Infusions:  feeding supplement (OSMOLITE 1.2 CAL) 55 mL/hr at 01/23/23 1743     Labs Reviewed    NUTRITION - FOCUSED PHYSICAL EXAM:  Flowsheet Row Most Recent Value  Orbital Region Mild depletion  Upper Arm Region Mild depletion  Thoracic and Lumbar Region No depletion  Buccal Region Moderate depletion  Temple Region Moderate depletion  Clavicle Bone Region Severe depletion  Clavicle and Acromion Bone Region Severe depletion  Scapular Bone Region Severe depletion  Dorsal Hand Mild depletion  Patellar Region Unable to assess  Anterior Thigh Region Severe depletion  Posterior Calf Region Severe depletion  Edema (RD Assessment) Mild  Hair Reviewed  Eyes Reviewed  Mouth Reviewed  Skin Reviewed  Nails Reviewed       Diet Order:   Diet Order             DIET - DYS 1 Room service appropriate? No; Fluid consistency: Thin  Diet effective now                   EDUCATION NEEDS:   Not appropriate for education at this time  Skin:  Skin Assessment: Reviewed RN Assessment (no pressure injuries)  Last BM:  12/16 type 6  Height:   Ht Readings from Last 1 Encounters:  01/19/23 5\' 3"  (1.6 m)    Weight:   Wt Readings from Last 1 Encounters:  01/21/23 69 kg    Ideal Body Weight:     BMI:  Body mass index is 26.95 kg/m.  Estimated Nutritional Needs:   Kcal:  1450-1650 kcals  Protein:  70-80 g  Fluid:  >/= 1.5 L    Jamelle Haring RDN, LDN Clinical Dietitian   If unable to reach, please contact "[facility] Inpatient" secure chat group between 8 am-4 pm daily"

## 2023-01-24 NOTE — Progress Notes (Signed)
   01/24/23 1900  BiPAP/CPAP/SIPAP  $ Non-Invasive Ventilator  Non-Invasive Vent Subsequent  BiPAP/CPAP/SIPAP Pt Type Adult  BiPAP/CPAP/SIPAP DREAMSTATIOND  Mask Type Full face mask  Mask Size Medium  PEEP 6 cmH20  FiO2 (%) 21 %  Patient Home Equipment No  Auto Titrate No  CPAP/SIPAP surface wiped down Yes

## 2023-01-24 NOTE — Plan of Care (Signed)
  Problem: Education: Goal: Knowledge of General Education information will improve Description: Including pain rating scale, medication(s)/side effects and non-pharmacologic comfort measures Outcome: Progressing   Problem: Health Behavior/Discharge Planning: Goal: Ability to manage health-related needs will improve Outcome: Progressing   Problem: Clinical Measurements: Goal: Ability to maintain clinical measurements within normal limits will improve Outcome: Progressing Goal: Will remain free from infection Outcome: Progressing Goal: Diagnostic test results will improve Outcome: Progressing Goal: Respiratory complications will improve Outcome: Progressing Goal: Cardiovascular complication will be avoided Outcome: Progressing   Problem: Activity: Goal: Risk for activity intolerance will decrease Outcome: Progressing   Problem: Nutrition: Goal: Adequate nutrition will be maintained Outcome: Progressing   Problem: Coping: Goal: Level of anxiety will decrease Outcome: Progressing   Problem: Elimination: Goal: Will not experience complications related to bowel motility Outcome: Progressing Goal: Will not experience complications related to urinary retention Outcome: Progressing   Problem: Pain Management: Goal: General experience of comfort will improve Outcome: Progressing   Problem: Safety: Goal: Ability to remain free from injury will improve Outcome: Progressing   Problem: Skin Integrity: Goal: Risk for impaired skin integrity will decrease Outcome: Progressing   Problem: Education: Goal: Ability to describe self-care measures that may prevent or decrease complications (Diabetes Survival Skills Education) will improve Outcome: Progressing Goal: Individualized Educational Video(s) Outcome: Progressing   Problem: Coping: Goal: Ability to adjust to condition or change in health will improve Outcome: Progressing   Problem: Fluid Volume: Goal: Ability to  maintain a balanced intake and output will improve Outcome: Progressing   Problem: Health Behavior/Discharge Planning: Goal: Ability to identify and utilize available resources and services will improve Outcome: Progressing Goal: Ability to manage health-related needs will improve Outcome: Progressing   Problem: Metabolic: Goal: Ability to maintain appropriate glucose levels will improve Outcome: Progressing   Problem: Nutritional: Goal: Maintenance of adequate nutrition will improve Outcome: Progressing Goal: Progress toward achieving an optimal weight will improve Outcome: Progressing   Problem: Skin Integrity: Goal: Risk for impaired skin integrity will decrease Outcome: Progressing   Problem: Tissue Perfusion: Goal: Adequacy of tissue perfusion will improve Outcome: Progressing   Problem: Education: Goal: Knowledge of disease or condition will improve Outcome: Progressing Goal: Knowledge of secondary prevention will improve (MUST DOCUMENT ALL) Outcome: Progressing Goal: Knowledge of patient specific risk factors will improve Loraine Leriche N/A or DELETE if not current risk factor) Outcome: Progressing   Problem: Intracerebral Hemorrhage Tissue Perfusion: Goal: Complications of Intracerebral Hemorrhage will be minimized Outcome: Progressing   Problem: Coping: Goal: Will verbalize positive feelings about self Outcome: Progressing Goal: Will identify appropriate support needs Outcome: Progressing   Problem: Health Behavior/Discharge Planning: Goal: Ability to manage health-related needs will improve Outcome: Progressing Goal: Goals will be collaboratively established with patient/family Outcome: Progressing   Problem: Self-Care: Goal: Ability to participate in self-care as condition permits will improve Outcome: Progressing Goal: Verbalization of feelings and concerns over difficulty with self-care will improve Outcome: Progressing Goal: Ability to communicate needs  accurately will improve Outcome: Progressing   Problem: Nutrition: Goal: Risk of aspiration will decrease Outcome: Progressing Goal: Dietary intake will improve Outcome: Progressing

## 2023-01-24 NOTE — Progress Notes (Addendum)
Pharmacy Antibiotic Note  Jocelyn [name] is a 89 y.o. female admitted on 01/14/2023 with acute right MCA infarct undergoing mechanical thrombectomy/revascularization. Patient PEG tube dislodged and replaced with noted abscess - s/p I&D with cultures sent 12/16 that are NGTD. Found to have enterobacter cloacae (R-fortaz, Zosyn) in 1/3 blood cultures. Patient was treated with cefepime for 7 days (ended on 12/23).   Patient now found to have lower lobe pneumonia and team is to start up antibiotics. Pharmacy has been consulted for meropenem dosing.  -WBC 15.3 > 14.2, Tmax 100.4 -Blood cultures : enterobacter cloacae (R-Zosyn, Elita Quick most likely AmpC-producer, no ESBL identified on BCID) -Cefepime course completed on 12/23  Plan: -Meropenem 1g IV every 12 hours -Azithromycin 500mg  IV every 24 hours -Monitor renal function -Monitor signs of clinical improvement and repeat cultures > if improves and cultures no growth would de-escalate to cefepime 2g IV every 12 hours -Follow up LOT and respiratory panel /COVID PCR  Height: 5\' 3"  (160 cm) Weight: 69 kg (152 lb 1.9 oz) IBW/kg (Calculated) : 52.4  Temp (24hrs), Avg:98.9 F (37.2 C), Min:98.3 F (36.8 C), Max:100.4 F (38 C)  Recent Labs  Lab 01/19/23 0613 01/20/23 0401 01/21/23 0803 01/22/23 0518 01/23/23 0507 01/24/23 0556  WBC 14.6* 17.5* 17.2* 15.7* 15.3* 14.2*  CREATININE 0.51 0.70 0.78 0.56  --  0.64    Estimated Creatinine Clearance: 44.4 mL/min (by C-G formula based on SCr of 0.64 mg/dL).    No Known Allergies  Antimicrobials this admission: Ceftriaxone 12/16 > 12/17  Cefepime 12/17 >> 12/23 Merrem 12/24 >> Azithromycin 12/24 >>   Microbiology results: 12/15 BCx: 1/3 Enterobacter cloacae  12/16 PEG drainage: NGTD 12/24 repeat Bcx: 12/24 Resp panel/COVID PCR:  12/14 MRSA PCR: negative   Thank you for allowing pharmacy to be a part of this patient's care.  Arabella Merles, PharmD. Clinical Pharmacist 01/24/2023  11:05 PM

## 2023-01-25 ENCOUNTER — Inpatient Hospital Stay (HOSPITAL_COMMUNITY): Payer: 59

## 2023-01-25 DIAGNOSIS — L02211 Cutaneous abscess of abdominal wall: Secondary | ICD-10-CM

## 2023-01-25 LAB — RESPIRATORY PANEL BY PCR

## 2023-01-25 LAB — CBC
HCT: 27.4 % — ABNORMAL LOW (ref 36.0–46.0)
Hemoglobin: 8.7 g/dL — ABNORMAL LOW (ref 12.0–15.0)
MCH: 26.4 pg (ref 26.0–34.0)
MCHC: 31.8 g/dL (ref 30.0–36.0)
MCV: 83.3 fL (ref 80.0–100.0)
Platelets: 186 10*3/uL (ref 150–400)
RBC: 3.29 MIL/uL — ABNORMAL LOW (ref 3.87–5.11)
RDW: 15.5 % (ref 11.5–15.5)
WBC: 12.7 10*3/uL — ABNORMAL HIGH (ref 4.0–10.5)
nRBC: 0 % (ref 0.0–0.2)

## 2023-01-25 LAB — GLUCOSE, CAPILLARY
Glucose-Capillary: 169 mg/dL — ABNORMAL HIGH (ref 70–99)
Glucose-Capillary: 162 mg/dL — ABNORMAL HIGH (ref 70–99)

## 2023-01-25 MED ORDER — FUROSEMIDE 10 MG/ML IJ SOLN
40.0000 mg | Freq: Once | INTRAMUSCULAR | Status: AC
  Administered 2023-01-25: 40 mg via INTRAVENOUS
  Filled 2023-01-25: qty 4

## 2023-01-25 MED ORDER — ALBUMIN HUMAN 25 % IV SOLN
25.0000 g | Freq: Once | INTRAVENOUS | Status: AC
  Administered 2023-01-25: 25 g via INTRAVENOUS
  Filled 2023-01-25: qty 100

## 2023-01-25 NOTE — Hospital Course (Signed)
89 year old with a history of OA bilateral knees, HTN, GERD, chronic iron deficiency anemia, HLD, OSA on CPAP, pre-DM, and chronic atrial fibrillation on Eliquis who was discharged from the Stroke Service at [facility] 12/11 after management of an acute right MCA territory infarct related to her atrial fibrillation.  During that preceding admission she presented with left-sided weakness and was found to have a right A1 and M2 occlusion for which she underwent successful mechanical thrombectomy/revascularization.  She was ultimately discharged to a SNF.   She returned to the ER at [facility] from her rehab facility on 12/14 after developing a new left-sided facial droop and left arm numbness with weakness.  CT head at presentation revealed interval development of scattered subarachnoid hemorrhage in the right sylvian fissure.  Her Eliquis was reversed with Kcentra.  CTa revealed no LVO but atheromatous plaque with outpouchings extending posteriorly and inferiorly from both supraclinoid ICAs possibly representing small aneurysms.   Significant Events:  12/14 Admitted with [facility] 12/15 dislodged PEG tube 12/15 long term EEG w/o evidence of seizure activity  12/16 transferred out of the ICU, incision and drainage of left abdominal wall infection. 12/17 blood cultures positive for Enterobacter cloacae, cefepime started 12/19 additional I&D of abdominal wall performed 12/21 [facility] assumed care of patient

## 2023-01-25 NOTE — Plan of Care (Signed)
  Problem: Education: Goal: Knowledge of General Education information will improve Description: Including pain rating scale, medication(s)/side effects and non-pharmacologic comfort measures Outcome: Progressing   Problem: Health Behavior/Discharge Planning: Goal: Ability to manage health-related needs will improve Outcome: Progressing   Problem: Clinical Measurements: Goal: Ability to maintain clinical measurements within normal limits will improve Outcome: Progressing Goal: Will remain free from infection Outcome: Progressing Goal: Diagnostic test results will improve Outcome: Progressing Goal: Respiratory complications will improve Outcome: Progressing Goal: Cardiovascular complication will be avoided Outcome: Progressing   Problem: Activity: Goal: Risk for activity intolerance will decrease Outcome: Progressing   Problem: Nutrition: Goal: Adequate nutrition will be maintained Outcome: Progressing   Problem: Coping: Goal: Level of anxiety will decrease Outcome: Progressing   Problem: Elimination: Goal: Will not experience complications related to bowel motility Outcome: Progressing Goal: Will not experience complications related to urinary retention Outcome: Progressing   Problem: Pain Management: Goal: General experience of comfort will improve Outcome: Progressing   Problem: Safety: Goal: Ability to remain free from injury will improve Outcome: Progressing   Problem: Skin Integrity: Goal: Risk for impaired skin integrity will decrease Outcome: Progressing   Problem: Education: Goal: Ability to describe self-care measures that may prevent or decrease complications (Diabetes Survival Skills Education) will improve Outcome: Progressing Goal: Individualized Educational Video(s) Outcome: Progressing   Problem: Coping: Goal: Ability to adjust to condition or change in health will improve Outcome: Progressing   Problem: Fluid Volume: Goal: Ability to  maintain a balanced intake and output will improve Outcome: Progressing   Problem: Health Behavior/Discharge Planning: Goal: Ability to identify and utilize available resources and services will improve Outcome: Progressing Goal: Ability to manage health-related needs will improve Outcome: Progressing   Problem: Metabolic: Goal: Ability to maintain appropriate glucose levels will improve Outcome: Progressing   Problem: Nutritional: Goal: Maintenance of adequate nutrition will improve Outcome: Progressing Goal: Progress toward achieving an optimal weight will improve Outcome: Progressing   Problem: Skin Integrity: Goal: Risk for impaired skin integrity will decrease Outcome: Progressing   Problem: Tissue Perfusion: Goal: Adequacy of tissue perfusion will improve Outcome: Progressing   Problem: Education: Goal: Knowledge of disease or condition will improve Outcome: Progressing Goal: Knowledge of secondary prevention will improve (MUST DOCUMENT ALL) Outcome: Progressing Goal: Knowledge of patient specific risk factors will improve Loraine Leriche N/A or DELETE if not current risk factor) Outcome: Progressing   Problem: Intracerebral Hemorrhage Tissue Perfusion: Goal: Complications of Intracerebral Hemorrhage will be minimized Outcome: Progressing   Problem: Coping: Goal: Will verbalize positive feelings about self Outcome: Progressing Goal: Will identify appropriate support needs Outcome: Progressing   Problem: Health Behavior/Discharge Planning: Goal: Ability to manage health-related needs will improve Outcome: Progressing Goal: Goals will be collaboratively established with patient/family Outcome: Progressing   Problem: Self-Care: Goal: Ability to participate in self-care as condition permits will improve Outcome: Progressing Goal: Verbalization of feelings and concerns over difficulty with self-care will improve Outcome: Progressing Goal: Ability to communicate needs  accurately will improve Outcome: Progressing   Problem: Nutrition: Goal: Risk of aspiration will decrease Outcome: Progressing Goal: Dietary intake will improve Outcome: Progressing

## 2023-01-25 NOTE — Plan of Care (Signed)
  Problem: Health Behavior/Discharge Planning: Goal: Ability to manage health-related needs will improve Outcome: Progressing   

## 2023-01-25 NOTE — Progress Notes (Signed)
Progress Note   Patient: Jocelyn [name] ZOX:[id number] DOB: 1931-05-15 DOA: 01/14/2023     11 DOS: the patient was seen and examined on 01/25/2023   Brief hospital course: 89 year old with a history of OA bilateral knees, HTN, GERD, chronic iron deficiency anemia, HLD, OSA on CPAP, pre-DM, and chronic atrial fibrillation on Eliquis who was discharged from the Stroke Service at [facility] 12/11 after management of an acute right MCA territory infarct related to her atrial fibrillation.  During that preceding admission she presented with left-sided weakness and was found to have a right A1 and M2 occlusion for which she underwent successful mechanical thrombectomy/revascularization.  She was ultimately discharged to a SNF.   She returned to the ER at [facility] from her rehab facility on 12/14 after developing a new left-sided facial droop and left arm numbness with weakness.  CT head at presentation revealed interval development of scattered subarachnoid hemorrhage in the right sylvian fissure.  Her Eliquis was reversed with Kcentra.  CTa revealed no LVO but atheromatous plaque with outpouchings extending posteriorly and inferiorly from both supraclinoid ICAs possibly representing small aneurysms.   Significant Events:  12/14 Admitted with [facility] 12/15 dislodged PEG tube 12/15 long term EEG w/o evidence of seizure activity  12/16 transferred out of the ICU, incision and drainage of left abdominal wall infection. 12/17 blood cultures positive for Enterobacter cloacae, cefepime started 12/19 additional I&D of abdominal wall performed 12/21 [facility] assumed care of patient  Assessment and Plan: Subarachnoid hemorrhage - possible cerebral aneurysm Noted at time of this readmission - occurred while on Eliquis therapy - imaging has suggested outpouchings extending posteriorly and inferiorly from both supraclinoid ICAs which could reflect small aneurysms - these were noted on CTa as well as MRa - yearly MRa is  recommended in follow-up by the Stroke Team. Per Neurology note, now not on antithrombotic   Recent CVA Admitted to the hospital 11/28-12/12/2022 for treatment of a right MCA territory infarct felt to be cardioembolic due to A-fib - an acute clot was noted at the right terminal ICA and the patient underwent intervention with subsequent revascularization   Abdominal wall infection General Surgery has been following with I&D performed 12/16 with Penrose drain placement and again 12/19 with placement of a second Penrose drain -wound care per General Surgery - IV antibiotic therapy resumed. Per general surgery recs to continue IV abx   Enterobacter bacteremia Noted on 1 of 2 blood cultures drawn 12/15 - third-generation cephalosporin and Zosyn resistant - completed 7 full days of cefepime therapy -now continued on IV abx, see below -Repeat blood cx thus far neg   Dysphagia due to stroke PEG tube originally placed 12/9 during prior admission - this became dislodged 12/15 with the tract temporarily preserved using a Foley catheter which was subsequently exchanged for a replacement PEG tube in IR 12/16   Abnormal EEG Routine EEG 12/14 raised the question of cortical dysfunction arising from the right hemisphere but with no evidence of seizure activity - long-term EEG revealed no evidence of active seizure activity   Hypertensive emergency Blood pressure presently well-controlled   Chronic atrial fibrillation Eliquis discontinued due to [facility] - rate controlled   Transaminitis Statin on hold - LFTs steadily improving - follow intermittently and resume statin once normalized   HLD LDL 33 - statin discontinued 12/17 due to elevated LFTs  Pneumonia -Overnight events noted. Concern for new L sided PNA. Suspect aspiration as pt is very high risk for aspiration following her CVA  and other comorbidities  -IV abx were resumed overnight. Will continue -Agree with keeping HOB elevated >30 degrees       Subjective: Difficult to assess given mentation and neurologic deficit  Physical Exam: Vitals:   01/24/23 2350 01/25/23 0403 01/25/23 0805 01/25/23 1117  BP: 135/72 (!) 124/57 137/68 (!) 153/74  Pulse: 99 93 99 (!) 109  Resp: (!) 21 (!) 22 17 17   Temp: 99.2 F (37.3 C) 99.1 F (37.3 C) 98 F (36.7 C) 100.1 F (37.8 C)  TempSrc: Axillary Axillary Oral Oral  SpO2: 100% 100% 100% 99%  Weight:      Height:       General exam: Awake, laying in bed, in nad Respiratory system: Normal respiratory effort, no wheezing Cardiovascular system: regular rate, s1, s2 Gastrointestinal system: Soft, nondistended, positive BS Central nervous system: CN2-12 grossly intact, strength intact Extremities: Perfused, no clubbing Skin: Normal skin turgor, no notable skin lesions seen Psychiatry: Mood normal // no visual hallucinations   Data Reviewed:  Labs reviewed: WBC 12.7, Hgb 8.7, Plts 186  Family Communication: Pt in room, family not at bedside  Disposition: Status is: Inpatient Remains inpatient appropriate because: severity of illness  Planned Discharge Destination: Skilled nursing facility    Author: Rickey Barbara, MD 01/25/2023 3:27 PM  For on call review [email or web address].

## 2023-01-25 NOTE — Progress Notes (Signed)
[facility] Progress Note     Subjective: Mild soreness around drains. No family at bedside. VSS and afebrile   Objective: Vital signs in last 24 hours: Temp:  [98 F (36.7 C)-100.4 F (38 C)] 98 F (36.7 C) (12/25 0805) Pulse Rate:  [93-109] 99 (12/25 0805) Resp:  [16-25] 17 (12/25 0805) BP: (124-151)/(55-72) 137/68 (12/25 0805) SpO2:  [98 %-100 %] 100 % (12/25 0805) FiO2 (%):  [21 %] 21 % (12/24 1900) Last BM Date : 01/24/23  Intake/Output from previous day: 12/24 0701 - 12/25 0700 In: 1468.8 [P.O.:354; NG/GT:954.8; IV Piggyback:100] Out: 700 [Urine:700] Intake/Output this shift: No intake/output data recorded.  PE: Gen:  Alert, NAD, pleasant Pulm: normal work of breathing on room air Abd: soft, ND, +BS, PEG site with some purulent drainage on dressing, LLQ I&D site with penrose drain present and scant SS fluid on dressing, medial I&D site closer to the PEG tube with scant purulent drainage. There is minimal cellulitis and induration now today present without palpable residual fluctuance . Applying pressure between the peg tube and the more medial drain elicits purulence.     Lab Results:  Recent Labs    01/24/23 0556 01/25/23 0540  WBC 14.2* 12.7*  HGB 9.0* 8.7*  HCT 27.3* 27.4*  PLT 211 186   BMET Recent Labs    01/24/23 0556  NA 131*  K 5.1  CL 102  CO2 23  GLUCOSE 153*  BUN 20  CREATININE 0.64  CALCIUM 8.7*   PT/INR No results for input(s): "LABPROT", "INR" in the last 72 hours. CMP     Component Value Date/Time   NA 131 (L) 01/24/2023 0556   NA 143 12/07/2022 1535   NA 141 04/13/2011 1125   K 5.1 01/24/2023 0556   K 3.4 (L) 04/13/2011 1125   CL 102 01/24/2023 0556   CL 105 04/13/2011 1125   CO2 23 01/24/2023 0556   CO2 29 04/13/2011 1125   GLUCOSE 153 (H) 01/24/2023 0556   GLUCOSE 111 (H) 04/13/2011 1125   BUN 20 01/24/2023 0556   BUN 16 12/07/2022 1535   BUN 14 04/13/2011 1125   CREATININE 0.64 01/24/2023 0556    CREATININE 0.80 08/01/2018 1544   CALCIUM 8.7 (L) 01/24/2023 0556   CALCIUM 9.2 04/13/2011 1125   PROT 6.9 01/24/2023 0556   PROT 7.6 12/07/2022 1535   PROT 7.8 04/13/2011 1125   ALBUMIN 1.9 (L) 01/24/2023 0556   ALBUMIN 4.4 12/07/2022 1535   ALBUMIN 4.0 04/13/2011 1125   AST 51 (H) 01/24/2023 0556   AST 24 04/13/2011 1125   ALT 87 (H) 01/24/2023 0556   ALT 20 04/13/2011 1125   ALKPHOS 79 01/24/2023 0556   ALKPHOS 51 04/13/2011 1125   BILITOT 0.6 01/24/2023 0556   BILITOT 0.8 12/07/2022 1535   BILITOT 0.6 04/13/2011 1125   GFRNONAA >60 01/24/2023 0556   GFRNONAA 67 08/01/2018 1544   GFRAA 78 08/01/2018 1544   Lipase  No results found for: "LIPASE"     Studies/Results: DG CHEST PORT 1 VIEW Result Date: 01/24/2023 CLINICAL DATA:  CHF EXAM: PORTABLE CHEST 1 VIEW COMPARISON:  01/14/2023 FINDINGS: Heart and mediastinal contours within normal limits. Aortic atherosclerosis. Patchy airspace disease throughout the left lower lung. No confluent opacity on the right. No effusions or acute bony abnormality. IMPRESSION: Patchy left lower lung airspace disease concerning for pneumonia. Electronically Signed   By: Charlett Nose M.D.   On: 01/24/2023 21:23     Anti-infectives: Anti-infectives (From admission,  onward)    Start     Dose/Rate Route Frequency Ordered Stop   01/25/23 0800  ciprofloxacin (CIPRO) tablet 500 mg  Status:  Discontinued        500 mg Oral 2 times daily 01/24/23 0850 01/24/23 2219   01/25/23 0000  azithromycin (ZITHROMAX) 500 mg in sodium chloride 0.9 % 250 mL IVPB        500 mg 250 mL/hr over 60 Minutes Intravenous Every 24 hours 01/24/23 2219     01/24/23 2315  meropenem (MERREM) 1 g in sodium chloride 0.9 % 100 mL IVPB        1 g 200 mL/hr over 30 Minutes Intravenous Every 12 hours 01/24/23 2218     01/17/23 1130  ceFEPIme (MAXIPIME) 2 g in sodium chloride 0.9 % 100 mL IVPB        2 g 200 mL/hr over 30 Minutes Intravenous Every 12 hours 01/17/23 1038 01/24/23  0355   01/16/23 0945  cefTRIAXone (ROCEPHIN) 2 g in sodium chloride 0.9 % 100 mL IVPB  Status:  Discontinued        2 g 200 mL/hr over 30 Minutes Intravenous Every 24 hours 01/16/23 0931 01/17/23 1038        Assessment/Plan Abdominal wall abscess -S/p PEG placement 12/9 Dr. Bedelia Person. PEG dislodged, tract maintained, able to get a foley into the tract. Exchanged for IR PEG 12/16.   -S/p bedside I&D of abdominal wall with penrose drain placement 12/16 by Dr. Janee Morn. S/p bedside I&D of abdominal wall with penrose placement 12/19 by Carlena Bjornstad, PA-C - Tolerating tube feedings at goal. Xray 12/18 confirmed PEG in good location without extravasation. - Cx NGTD - cellulitis remains, WBC down-trending now. Continue IV abx for cellulitis and allow abdominal wall to continue draining.  FEN - D1 diet, tube feeds at goal VTE - SCD's, sq heparin, DOAC held in the setting of ICH ID - Enterobacter Bacteremia 12/15,  cefepime 12/17>>    - per TRH -  Transaminitis - alk phos and bilirubin are WNL; gallbladder is surgically absent  R SAH  Cerebral aneurysm  HTN emergency Hx ischemic R MCA stroke  A.fib  HLD ODA Osteoarthritis   I reviewed hospitalist notes, last 24 h vitals and pain scores, last 48 h intake and output, and last 24 h labs and trends.   LOS: 11 days   Juliet Rude, [facility] 01/25/2023, 8:08 AM Please see Amion for pager number during day hours 7:00am-4:30pm

## 2023-01-25 NOTE — Plan of Care (Signed)
Problem: Education: Goal: Knowledge of General Education information will improve Description: Including pain rating scale, medication(s)/side effects and non-pharmacologic comfort measures 01/25/2023 1001 by Beryle Flock, RN Outcome: Progressing 01/25/2023 0953 by Beryle Flock, RN Outcome: Progressing   Problem: Health Behavior/Discharge Planning: Goal: Ability to manage health-related needs will improve 01/25/2023 1001 by Beryle Flock, RN Outcome: Progressing 01/25/2023 0953 by Beryle Flock, RN Outcome: Progressing   Problem: Clinical Measurements: Goal: Ability to maintain clinical measurements within normal limits will improve 01/25/2023 1001 by Beryle Flock, RN Outcome: Progressing 01/25/2023 0953 by Beryle Flock, RN Outcome: Progressing Goal: Will remain free from infection 01/25/2023 1001 by Beryle Flock, RN Outcome: Progressing 01/25/2023 0953 by Beryle Flock, RN Outcome: Progressing Goal: Diagnostic test results will improve 01/25/2023 1001 by Beryle Flock, RN Outcome: Progressing 01/25/2023 0953 by Beryle Flock, RN Outcome: Progressing Goal: Respiratory complications will improve 01/25/2023 1001 by Beryle Flock, RN Outcome: Progressing 01/25/2023 0953 by Beryle Flock, RN Outcome: Progressing Goal: Cardiovascular complication will be avoided 01/25/2023 1001 by Beryle Flock, RN Outcome: Progressing 01/25/2023 0953 by Beryle Flock, RN Outcome: Progressing   Problem: Activity: Goal: Risk for activity intolerance will decrease 01/25/2023 1001 by Beryle Flock, RN Outcome: Progressing 01/25/2023 0953 by Beryle Flock, RN Outcome: Progressing   Problem: Nutrition: Goal: Adequate nutrition will be maintained 01/25/2023 1001 by Beryle Flock, RN Outcome: Progressing 01/25/2023 0953 by Beryle Flock, RN Outcome: Progressing   Problem: Coping: Goal: Level of anxiety will decrease 01/25/2023 1001 by Beryle Flock, RN Outcome:  Progressing 01/25/2023 0953 by Beryle Flock, RN Outcome: Progressing   Problem: Elimination: Goal: Will not experience complications related to bowel motility 01/25/2023 1001 by Beryle Flock, RN Outcome: Progressing 01/25/2023 0953 by Beryle Flock, RN Outcome: Progressing Goal: Will not experience complications related to urinary retention 01/25/2023 1001 by Beryle Flock, RN Outcome: Progressing 01/25/2023 0953 by Beryle Flock, RN Outcome: Progressing   Problem: Pain Management: Goal: General experience of comfort will improve 01/25/2023 1001 by Beryle Flock, RN Outcome: Progressing 01/25/2023 0953 by Beryle Flock, RN Outcome: Progressing   Problem: Safety: Goal: Ability to remain free from injury will improve 01/25/2023 1001 by Beryle Flock, RN Outcome: Progressing 01/25/2023 0953 by Beryle Flock, RN Outcome: Progressing   Problem: Skin Integrity: Goal: Risk for impaired skin integrity will decrease 01/25/2023 1001 by Beryle Flock, RN Outcome: Progressing 01/25/2023 0953 by Beryle Flock, RN Outcome: Progressing   Problem: Education: Goal: Ability to describe self-care measures that may prevent or decrease complications (Diabetes Survival Skills Education) will improve 01/25/2023 1001 by Beryle Flock, RN Outcome: Progressing 01/25/2023 0953 by Beryle Flock, RN Outcome: Progressing Goal: Individualized Educational Video(s) 01/25/2023 1001 by Beryle Flock, RN Outcome: Progressing 01/25/2023 0953 by Beryle Flock, RN Outcome: Progressing   Problem: Coping: Goal: Ability to adjust to condition or change in health will improve 01/25/2023 1001 by Beryle Flock, RN Outcome: Progressing 01/25/2023 0953 by Beryle Flock, RN Outcome: Progressing   Problem: Fluid Volume: Goal: Ability to maintain a balanced intake and output will improve 01/25/2023 1001 by Beryle Flock, RN Outcome: Progressing 01/25/2023 0953 by Beryle Flock, RN Outcome: Progressing    Problem: Health Behavior/Discharge Planning: Goal: Ability to identify and utilize available resources and services will improve 01/25/2023 1001 by Beryle Flock, RN Outcome: Progressing 01/25/2023 0953 by Beryle Flock, RN Outcome: Progressing Goal: Ability to manage health-related needs will improve 01/25/2023 1001 by Beryle Flock, RN Outcome: Progressing 01/25/2023 0953 by Beryle Flock, RN Outcome: Progressing   Problem: Metabolic: Goal: Ability to maintain appropriate glucose levels will improve  01/25/2023 1001 by Beryle Flock, RN Outcome: Progressing 01/25/2023 0953 by Beryle Flock, RN Outcome: Progressing   Problem: Nutritional: Goal: Maintenance of adequate nutrition will improve 01/25/2023 1001 by Beryle Flock, RN Outcome: Progressing 01/25/2023 0953 by Beryle Flock, RN Outcome: Progressing Goal: Progress toward achieving an optimal weight will improve 01/25/2023 1001 by Beryle Flock, RN Outcome: Progressing 01/25/2023 0953 by Beryle Flock, RN Outcome: Progressing   Problem: Skin Integrity: Goal: Risk for impaired skin integrity will decrease 01/25/2023 1001 by Beryle Flock, RN Outcome: Progressing 01/25/2023 0953 by Beryle Flock, RN Outcome: Progressing   Problem: Tissue Perfusion: Goal: Adequacy of tissue perfusion will improve 01/25/2023 1001 by Beryle Flock, RN Outcome: Progressing 01/25/2023 0953 by Beryle Flock, RN Outcome: Progressing   Problem: Education: Goal: Knowledge of disease or condition will improve 01/25/2023 1001 by Beryle Flock, RN Outcome: Progressing 01/25/2023 0953 by Beryle Flock, RN Outcome: Progressing Goal: Knowledge of secondary prevention will improve (MUST DOCUMENT ALL) 01/25/2023 1001 by Beryle Flock, RN Outcome: Progressing 01/25/2023 0953 by Beryle Flock, RN Outcome: Progressing Goal: Knowledge of patient specific risk factors will improve Loraine Leriche N/A or DELETE if not current risk factor) 01/25/2023 1001  by Beryle Flock, RN Outcome: Progressing 01/25/2023 0953 by Beryle Flock, RN Outcome: Progressing   Problem: Intracerebral Hemorrhage Tissue Perfusion: Goal: Complications of Intracerebral Hemorrhage will be minimized 01/25/2023 1001 by Beryle Flock, RN Outcome: Progressing 01/25/2023 0953 by Beryle Flock, RN Outcome: Progressing   Problem: Coping: Goal: Will verbalize positive feelings about self 01/25/2023 1001 by Beryle Flock, RN Outcome: Progressing 01/25/2023 0953 by Beryle Flock, RN Outcome: Progressing Goal: Will identify appropriate support needs 01/25/2023 1001 by Beryle Flock, RN Outcome: Progressing 01/25/2023 0953 by Beryle Flock, RN Outcome: Progressing   Problem: Health Behavior/Discharge Planning: Goal: Ability to manage health-related needs will improve 01/25/2023 1001 by Beryle Flock, RN Outcome: Progressing 01/25/2023 0953 by Beryle Flock, RN Outcome: Progressing Goal: Goals will be collaboratively established with patient/family 01/25/2023 1001 by Beryle Flock, RN Outcome: Progressing 01/25/2023 0953 by Beryle Flock, RN Outcome: Progressing   Problem: Self-Care: Goal: Ability to participate in self-care as condition permits will improve 01/25/2023 1001 by Beryle Flock, RN Outcome: Progressing 01/25/2023 0953 by Beryle Flock, RN Outcome: Progressing Goal: Verbalization of feelings and concerns over difficulty with self-care will improve 01/25/2023 1001 by Beryle Flock, RN Outcome: Progressing 01/25/2023 0953 by Beryle Flock, RN Outcome: Progressing Goal: Ability to communicate needs accurately will improve 01/25/2023 1001 by Beryle Flock, RN Outcome: Progressing 01/25/2023 0953 by Beryle Flock, RN Outcome: Progressing   Problem: Nutrition: Goal: Risk of aspiration will decrease 01/25/2023 1001 by Beryle Flock, RN Outcome: Progressing 01/25/2023 0953 by Beryle Flock, RN Outcome: Progressing Goal: Dietary intake will  improve 01/25/2023 1001 by Beryle Flock, RN Outcome: Progressing 01/25/2023 0953 by Beryle Flock, RN Outcome: Progressing

## 2023-01-26 DIAGNOSIS — L02211 Cutaneous abscess of abdominal wall: Secondary | ICD-10-CM | POA: Diagnosis not present

## 2023-01-26 LAB — COMPREHENSIVE METABOLIC PANEL
ALT: 76 U/L — ABNORMAL HIGH (ref 0–44)
AST: 50 U/L — ABNORMAL HIGH (ref 15–41)
Albumin: 2 g/dL — ABNORMAL LOW (ref 3.5–5.0)
Alkaline Phosphatase: 73 U/L (ref 38–126)
Anion gap: 8 (ref 5–15)
BUN: 29 mg/dL — ABNORMAL HIGH (ref 8–23)
CO2: 24 mmol/L (ref 22–32)
Calcium: 8.4 mg/dL — ABNORMAL LOW (ref 8.9–10.3)
Chloride: 97 mmol/L — ABNORMAL LOW (ref 98–111)
Creatinine, Ser: 0.6 mg/dL (ref 0.44–1.00)
GFR, Estimated: >60 mL/min (ref >60)
Glucose, Bld: 142 mg/dL — ABNORMAL HIGH (ref 70–99)
Potassium: 5 mmol/L (ref 3.5–5.1)
Sodium: 129 mmol/L — ABNORMAL LOW (ref 135–145)
Total Bilirubin: 0.4 mg/dL (ref <1.2)
Total Protein: 6.6 g/dL (ref 6.5–8.1)

## 2023-01-26 LAB — CBC
HCT: 26.2 % — ABNORMAL LOW (ref 36.0–46.0)
Hemoglobin: 8.4 g/dL — ABNORMAL LOW (ref 12.0–15.0)
MCH: 26.8 pg (ref 26.0–34.0)
MCHC: 32.1 g/dL (ref 30.0–36.0)
MCV: 83.4 fL (ref 80.0–100.0)
Platelets: 217 10*3/uL (ref 150–400)
RBC: 3.14 MIL/uL — ABNORMAL LOW (ref 3.87–5.11)
RDW: 15.8 % — ABNORMAL HIGH (ref 11.5–15.5)
WBC: 14.5 10*3/uL — ABNORMAL HIGH (ref 4.0–10.5)
nRBC: 0 % (ref 0.0–0.2)

## 2023-01-26 LAB — GLUCOSE, CAPILLARY
Glucose-Capillary: 120 mg/dL — ABNORMAL HIGH (ref 70–99)
Glucose-Capillary: 169 mg/dL — ABNORMAL HIGH (ref 70–99)

## 2023-01-26 MED ORDER — OYSTER SHELL CALCIUM/D3 500-5 MG-MCG PO TABS
1.0000 | ORAL_TABLET | Freq: Two times a day (BID) | ORAL | Status: DC
  Administered 2023-01-26 – 2023-02-07 (×25): 1
  Filled 2023-01-26 (×25): qty 1

## 2023-01-26 MED ORDER — LIDOCAINE HCL 1 % IJ SOLN
10.0000 mL | INTRAMUSCULAR | Status: AC
  Administered 2023-01-26: 10 mL via INTRADERMAL
  Filled 2023-01-26: qty 10

## 2023-01-26 MED ORDER — NYSTATIN 100000 UNIT/ML MT SUSP
5.0000 mL | Freq: Four times a day (QID) | OROMUCOSAL | Status: DC
  Administered 2023-01-26 – 2023-02-07 (×47): 500000 [IU] via OROMUCOSAL
  Filled 2023-01-26 (×47): qty 5

## 2023-01-26 MED ORDER — FAMOTIDINE 20 MG PO TABS
20.0000 mg | ORAL_TABLET | Freq: Every day | ORAL | Status: DC
  Administered 2023-01-26 – 2023-02-07 (×13): 20 mg
  Filled 2023-01-26 (×13): qty 1

## 2023-01-26 MED ORDER — SODIUM CHLORIDE 0.9 % IV SOLN
INTRAVENOUS | Status: AC
Start: 2023-01-26 — End: 2023-01-27

## 2023-01-26 MED ORDER — HYDROCODONE-ACETAMINOPHEN 5-325 MG PO TABS
1.0000 | ORAL_TABLET | Freq: Four times a day (QID) | ORAL | Status: DC | PRN
  Administered 2023-01-26 – 2023-02-06 (×16): 1
  Filled 2023-01-26 (×16): qty 1

## 2023-01-26 NOTE — Progress Notes (Signed)
Speech Language Pathology Treatment: Dysphagia  Patient Details Name: Jocelyn [name] MRN: [id number] DOB: October 27, 1931 Today's Date: 01/26/2023 Time: 1205-1225 SLP Time Calculation (min) (ACUTE ONLY): 20 min  Assessment / Plan / Recommendation Clinical Impression  Reevaluation of swallow order received.  Pt awake upon entrance to room but was slightly sleepy as she had recently been given pain medication.  She denies desire for intake but after accepted first ice chip, applesauce and apple juice, she readily opened her mouth to accept more.   She consumed approximately 2 ounces of water sequentially via straw with SLP attempting 3 ounce Yale. No clinical indication of aspiration across all po trials - approximately 10 boluses total.    PO trials were ceased as pt began closing her eyes frequently and SLP was concerned for increased aspiration risk.    Pt has a PEG for nutrition, thus recommend to allow po intake with strict precautions.     Sub-optimal positioning (pt on air mattress and leans to the left) as well as mentation, increases her aspiration risk.  She inconsistently responds to SLP - she does reports some hearing loss, but also suspect cognitive linguistic difficulties to some extent.   Coating on tongue observed, concerning for potential candidiasis - pt denies discomfort.     SLP found peanut butter and graham crackers in pt's room *uncertain who was eating these * but she iwas on a puree/thin diet prior to being made NPO.  She reports having dentures that she uses via yes/no, but uncertain to her ability to recall, thus left note above bed requesting her dentures and adhesive if she uses them for eating PTA.  Pt did not verbalize, with choice of 3 names in chart for 2 daughter and 1 son,  contact to request dentures.  Will follow up for dysphagia management.  Messaged MD and RN with recommendations and posted swallow precaution signs.    HPI HPI: 89 yo female presenting back to  the hospital (transfer from ED at [facility]) from SNF on 12/14 with acute stroke symptoms and per EMS she had a new onset of left sided facial droop, numbness to left arm and significantly increased weakness of left arm. CTH showed showed interval development of scattered subarachnoid hemorrhage in the right sylvian fissure. Neurosurgery consulted and recommended Keppra 500mg . WBC elevated and trauma was consulted to evaluate PEG tube with CT abdomen pending. She was recently admitted 11/27-12/11 with L sided weakness and R MCA occlusion s/p IR revascularization with complicated arterial cannulation requiring multiple sticks. Intubated 11/28-11/29. PMH includes: GERD, HTN, CHF, AFIB RVR on eliquis, HLD, OSA. MBSS 01/02/23 was limited due to oral holding, repeat MBS on 12/11 showed moderate oral dysphagia and suspected pharyngoesophageal dysphagia with prominent cricopharyngeaus and recommened PO diet of Dys 1 (puree), thin liquids; ST f/u for dysphagia tx/potential upgrade to diet.      SLP Plan  Continue with current plan of care      Recommendations for follow up therapy are one component of a multi-disciplinary discharge planning process, led by the attending physician.  Recommendations may be updated based on patient status, additional functional criteria and insurance authorization.    Recommendations  Diet recommendations: Dysphagia 1 (puree);Thin liquid (or full liquids) Liquids provided via: Straw;Cup Medication Administration: Other (Comment) (either with puree or via PEG) Supervision: Full supervision/cueing for compensatory strategies Compensations: Minimize environmental distractions;Slow rate;Small sips/bites;Other (Comment) (check for oral retention - especially on the left) Postural Changes and/or Swallow Maneuvers: Seated upright 90 degrees;Upright 30-60  min after meal (as upright as able)                  Oral care BID   Frequent or constant Supervision/Assistance Dysphagia,  oropharyngeal phase (R13.12);Cognitive communication deficit (R41.841)     Continue with current plan of care   Rolena Infante, MS [facility] SLP Acute Rehab Services Office [phone]   Chales Abrahams  01/26/2023, 12:34 PM

## 2023-01-26 NOTE — Progress Notes (Signed)
SLP Cancellation Note  Patient Details Name: [name] BEICHNER MRN: [id number] DOB: 11-23-1931   Cancelled treatment:       Reason Eval/Treat Not Completed: (P) Other (comment) (SLP and RN had repositioned pt and set her up - surgery APP arrived and announced plans to aspirate pt's wound, will continue efforts)   Chales Abrahams 01/26/2023, 8:25 AM

## 2023-01-26 NOTE — Progress Notes (Signed)
Progress Note   Patient: Jocelyn [name] WUJ:[id number] DOB: [birth year] DOA: 01/14/2023     12 DOS: the patient was seen and examined on 01/26/2023   Brief hospital course: 89 year old with a history of OA bilateral knees, HTN, GERD, chronic iron deficiency anemia, HLD, OSA on CPAP, pre-DM, and chronic atrial fibrillation on Eliquis who was discharged from the Stroke Service at [facility] 12/11 after management of an acute right MCA territory infarct related to her atrial fibrillation.  During that preceding admission she presented with left-sided weakness and was found to have a right A1 and M2 occlusion for which she underwent successful mechanical thrombectomy/revascularization.  She was ultimately discharged to a SNF.   She returned to the ER at [facility] from her rehab facility on 12/14 after developing a new left-sided facial droop and left arm numbness with weakness.  CT head at presentation revealed interval development of scattered subarachnoid hemorrhage in the right sylvian fissure.  Her Eliquis was reversed with Kcentra.  CTa revealed no LVO but atheromatous plaque with outpouchings extending posteriorly and inferiorly from both supraclinoid ICAs possibly representing small aneurysms.   Significant Events:  12/14 Admitted with [facility] 12/15 dislodged PEG tube 12/15 long term EEG w/o evidence of seizure activity  12/16 transferred out of the ICU, incision and drainage of left abdominal wall infection. 12/17 blood cultures positive for Enterobacter cloacae, cefepime started 12/19 additional I&D of abdominal wall performed 12/21 [facility] assumed care of patient  Assessment and Plan: Subarachnoid hemorrhage - possible cerebral aneurysm Noted at time of this readmission - occurred while on Eliquis therapy - imaging has suggested outpouchings extending posteriorly and inferiorly from both supraclinoid ICAs which could reflect small aneurysms - these were noted on CTa as well as MRa - yearly MRa is  recommended in follow-up by the Stroke Team. Per Neurology note, now not on antithrombotic   Recent CVA Admitted to the hospital 11/28-12/12/2022 for treatment of a right MCA territory infarct felt to be cardioembolic due to A-fib - an acute clot was noted at the right terminal ICA and the patient underwent intervention with subsequent revascularization   Abdominal wall infection General Surgery has been following with I&D performed 12/16 with Penrose drain placement and again 12/19 with placement of a second Penrose drain -wound care per General Surgery - IV antibiotic therapy resumed.  -aspiration today by Surgery. If not improved, may need actual incision/drainage at bedside or in OR tomorrow   Enterobacter bacteremia Noted on 1 of 2 blood cultures drawn 12/15 - third-generation cephalosporin and Zosyn resistant - had earlier completed 7 full days of cefepime therapy -Repeat blood cx thus far neg   Dysphagia due to stroke PEG tube originally placed 12/9 during prior admission - this became dislodged 12/15 with the tract temporarily preserved using a Foley catheter which was subsequently exchanged for a replacement PEG tube in IR 12/16 -Re-evaluated by SLP 12/26 given concerns of aspiration. Cleared for dysphagia 1   Abnormal EEG Routine EEG 12/14 raised the question of cortical dysfunction arising from the right hemisphere but with no evidence of seizure activity - long-term EEG revealed no evidence of active seizure activity   Hypertensive emergency Blood pressure presently well-controlled   Chronic atrial fibrillation Eliquis discontinued due to [facility] - rate controlled   Transaminitis Statin on hold - LFTs steadily improving - follow intermittently and resume statin once normalized   HLD LDL 33 - statin discontinued 12/17 due to elevated LFTs  Aspiration Pneumonia with sepsis not present  on admit -Pt febrile with leukocytosis, recently tachycardic -Concern for new L sided PNA on  CXR, reviewed. Suspicious for aspiration -continue broad spectrum abx -Appreciate SLP input. Pt has since been clear for dysphagia 1, however pt was observed closing eyes during trial, which concerned SLP for increased risk for aspiration -Agree with keeping HOB elevated >30 degrees -Will give trial of nystatin swish/swallow to cover oral candidiasis for now      Subjective: Cannot assess given deficits  Physical Exam: Vitals:   01/25/23 2334 01/26/23 0324 01/26/23 0731 01/26/23 1131  BP: (!) 155/95 (!) 113/56 123/72 (!) 116/55  Pulse: (!) 109 (!) 103 68 84  Resp: 18 18 18 17   Temp: 98.7 F (37.1 C) 100 F (37.8 C) 98.3 F (36.8 C) 98.3 F (36.8 C)  TempSrc: Oral Oral Oral Oral  SpO2: 99% 100% 100% 100%  Weight:      Height:       General exam: Not conversant, in no acute distress Respiratory system: normal chest rise, clear, no audible wheezing Cardiovascular system: regular rhythm, s1-s2 Gastrointestinal system: Nondistended, nontender, pos BS Central nervous system: No seizures, no tremors Extremities: No cyanosis, no joint deformities Skin: No rashes, no pallor Psychiatry: Unable to assess given deficits  Data Reviewed:  Labs reviewed: Na 129, k 5.0, Cr 0.60, WBC 14.5, hgb 8.4, Plts 217  Family Communication: Pt in room, family not at bedside  Disposition: Status is: Inpatient Remains inpatient appropriate because: severity of illness  Planned Discharge Destination: Skilled nursing facility    Author: Rickey Barbara, MD 01/26/2023 2:27 PM  For on call review [email or web address].

## 2023-01-26 NOTE — Progress Notes (Signed)
Physical Therapy Treatment Patient Details Name: Jocelyn [name] MRN: [id number] DOB: [birth year] Today's Date: 01/26/2023   History of Present Illness Pt is an 89 y.o. female who presented to the ED 12/13 due to LUE weakness, left facial droop and CTH showed development of scattered SAH. 12/15 dislodged PEG tube. 12/16 incision and drainage of left abdominal wall infection PEG replaced. 12/19 additional I&D of abdominal wall performed. Recent hospitalization 11/27-12/11 due to R MCA ischemic stroke for which she underwent IR thrombectomy and PEG placement.  She was discharged to [facility] 12/11. PMH: HTN, CHF, a fib with RVR on eliquis, HLD, OSA    PT Comments  Patient required cleaning with multiple times rolling. Pt resists movement due to fear of falling (pt able to verbalize and reports it is not due to pain). Worked on transitional skills (rolling, side to sit) in both directions with pt doing better with rt side to sit. Worked on feeling where midline is, going down on rt elbow, and then back up to midline (with pt tendency to push beyond midline to left lean).     If plan is discharge home, recommend the following: Two people to help with walking and/or transfers;Direct supervision/assist for medications management;Assist for transportation;Two people to help with bathing/dressing/bathroom;Assistance with cooking/housework;Assistance with feeding;Direct supervision/assist for financial management;Help with stairs or ramp for entrance;Supervision due to cognitive status   Can travel by private vehicle     No  Equipment Recommendations  None recommended by PT    Recommendations for Other Services       Precautions / Restrictions Precautions Precautions: Fall Precaution Comments: PEG tube, abd binder Restrictions Weight Bearing Restrictions Per Provider Order: No     Mobility  Bed Mobility Overal bed mobility: Needs Assistance Bed Mobility: Sit to Supine, Rolling, Sidelying to  Sit Rolling: Used rails, Max assist, Total assist Sidelying to sit: HOB elevated, Total assist, +2 for physical assistance     Sit to sidelying: Max assist, +2 for physical assistance General bed mobility comments: rolling to left pt stiffens and resists with RUE; rolling to rt same; Lside to sit +2 total and unable to achieve upright due to severe pushing from rt to left; unable to get pt to reach for footboard as she has done previously; returned to sidelying, rolled to rt and rt side to sit also +2 total, however pt able to achieve midline (eventually with slight left lean due to pushing)    Transfers                   General transfer comment: unable due to poor sitting balance and pushing    Ambulation/Gait                   Stairs             Wheelchair Mobility     Tilt Bed    Modified Rankin (Stroke Patients Only) Modified Rankin (Stroke Patients Only) Pre-Morbid Rankin Score: Severe disability Modified Rankin: Severe disability     Balance Overall balance assessment: Needs assistance Sitting-balance support: Feet supported, Single extremity supported Sitting balance-Leahy Scale: Zero Sitting balance - Comments: max to +2 max assist for balance due to pushing Postural control: Left lateral lean     Standing balance comment: unable to attempt                            Cognition Arousal: Alert Behavior During Therapy: Flat  affect   Area of Impairment: Attention, Following commands, Memory, Safety/judgement, Awareness, Problem solving                   Current Attention Level: Focused Memory: Decreased recall of precautions, Decreased short-term memory Following Commands: Follows one step commands inconsistently Safety/Judgement: Decreased awareness of safety, Decreased awareness of deficits Awareness: Intellectual Problem Solving: Slow processing, Decreased initiation, Difficulty sequencing, Requires verbal cues,  Requires tactile cues General Comments: pt a pusher and fearful of falling; internally distracted by fear with decr ability to attend to or follow commands        Exercises General Exercises - Lower Extremity Heel Slides: AAROM, Both, 5 reps, Supine    General Comments General comments (skin integrity, edema, etc.): Grandaughter present and encouraging.      Pertinent Vitals/Pain Pain Assessment Pain Assessment: Faces Faces Pain Scale: Hurts even more Pain Location: bilateral knees Pain Descriptors / Indicators: Grimacing, Discomfort Pain Intervention(s): Limited activity within patient's tolerance, Monitored during session, Repositioned    Home Living                          Prior Function            PT Goals (current goals can now be found in the care plan section) Acute Rehab PT Goals Patient Stated Goal: not stated Time For Goal Achievement: 01/29/23 Potential to Achieve Goals: Fair Progress towards PT goals: Progressing toward goals    Frequency    Min 1X/week      PT Plan      Co-evaluation              AM-PAC PT "6 Clicks" Mobility   Outcome Measure  Help needed turning from your back to your side while in a flat bed without using bedrails?: A Lot Help needed moving from lying on your back to sitting on the side of a flat bed without using bedrails?: Total Help needed moving to and from a bed to a chair (including a wheelchair)?: Total Help needed standing up from a chair using your arms (e.g., wheelchair or bedside chair)?: Total Help needed to walk in hospital room?: Total Help needed climbing 3-5 steps with a railing? : Total 6 Click Score: 7    End of Session   Activity Tolerance: Patient tolerated treatment well Patient left: with call bell/phone within reach;in bed;with family/visitor present (HOB >30 deg) Nurse Communication: Mobility status PT Visit Diagnosis: Other abnormalities of gait and mobility (R26.89);Hemiplegia  and hemiparesis;Other symptoms and signs involving the nervous system (R29.898) Hemiplegia - Right/Left: Left Hemiplegia - dominant/non-dominant: Non-dominant Hemiplegia - caused by: Nontraumatic intracerebral hemorrhage     Time: 1334-1415 PT Time Calculation (min) (ACUTE ONLY): 41 min  Charges:    $Neuromuscular Re-education: 38-52 mins PT General Charges $$ ACUTE PT VISIT: 1 Visit                      Jerolyn Center, PT Acute Rehabilitation Services  Office [phone]    Zena Amos 01/26/2023, 3:35 PM

## 2023-01-26 NOTE — Plan of Care (Signed)
  Problem: Education: Goal: Knowledge of General Education information will improve Description: Including pain rating scale, medication(s)/side effects and non-pharmacologic comfort measures Outcome: Progressing   Problem: Health Behavior/Discharge Planning: Goal: Ability to manage health-related needs will improve Outcome: Progressing   Problem: Clinical Measurements: Goal: Ability to maintain clinical measurements within normal limits will improve Outcome: Progressing Goal: Will remain free from infection Outcome: Progressing Goal: Diagnostic test results will improve Outcome: Progressing Goal: Respiratory complications will improve Outcome: Progressing Goal: Cardiovascular complication will be avoided Outcome: Progressing   Problem: Activity: Goal: Risk for activity intolerance will decrease Outcome: Progressing   Problem: Nutrition: Goal: Adequate nutrition will be maintained Outcome: Progressing   Problem: Coping: Goal: Level of anxiety will decrease Outcome: Progressing   Problem: Elimination: Goal: Will not experience complications related to bowel motility Outcome: Progressing Goal: Will not experience complications related to urinary retention Outcome: Progressing   Problem: Pain Management: Goal: General experience of comfort will improve Outcome: Progressing   Problem: Safety: Goal: Ability to remain free from injury will improve Outcome: Progressing   Problem: Skin Integrity: Goal: Risk for impaired skin integrity will decrease Outcome: Progressing   Problem: Education: Goal: Ability to describe self-care measures that may prevent or decrease complications (Diabetes Survival Skills Education) will improve Outcome: Progressing Goal: Individualized Educational Video(s) Outcome: Progressing   Problem: Coping: Goal: Ability to adjust to condition or change in health will improve Outcome: Progressing   Problem: Fluid Volume: Goal: Ability to  maintain a balanced intake and output will improve Outcome: Progressing   Problem: Health Behavior/Discharge Planning: Goal: Ability to identify and utilize available resources and services will improve Outcome: Progressing Goal: Ability to manage health-related needs will improve Outcome: Progressing   Problem: Metabolic: Goal: Ability to maintain appropriate glucose levels will improve Outcome: Progressing   Problem: Nutritional: Goal: Maintenance of adequate nutrition will improve Outcome: Progressing Goal: Progress toward achieving an optimal weight will improve Outcome: Progressing   Problem: Skin Integrity: Goal: Risk for impaired skin integrity will decrease Outcome: Progressing   Problem: Tissue Perfusion: Goal: Adequacy of tissue perfusion will improve Outcome: Progressing   Problem: Education: Goal: Knowledge of disease or condition will improve Outcome: Progressing Goal: Knowledge of secondary prevention will improve (MUST DOCUMENT ALL) Outcome: Progressing Goal: Knowledge of patient specific risk factors will improve Loraine Leriche N/A or DELETE if not current risk factor) Outcome: Progressing   Problem: Intracerebral Hemorrhage Tissue Perfusion: Goal: Complications of Intracerebral Hemorrhage will be minimized Outcome: Progressing   Problem: Coping: Goal: Will verbalize positive feelings about self Outcome: Progressing Goal: Will identify appropriate support needs Outcome: Progressing   Problem: Health Behavior/Discharge Planning: Goal: Ability to manage health-related needs will improve Outcome: Progressing Goal: Goals will be collaboratively established with patient/family Outcome: Progressing   Problem: Self-Care: Goal: Ability to participate in self-care as condition permits will improve Outcome: Progressing Goal: Verbalization of feelings and concerns over difficulty with self-care will improve Outcome: Progressing Goal: Ability to communicate needs  accurately will improve Outcome: Progressing   Problem: Nutrition: Goal: Risk of aspiration will decrease Outcome: Progressing Goal: Dietary intake will improve Outcome: Progressing

## 2023-01-26 NOTE — Progress Notes (Signed)
[facility] Progress Note     Subjective: VSS, aspiration event and now strict NPO. Alert and tolerating TF still. Abdominal wall with a little more pain, not much drainage  Objective: Vital signs in last 24 hours: Temp:  [98.3 F (36.8 C)-101.6 F (38.7 C)] 98.3 F (36.8 C) (12/26 0731) Pulse Rate:  [68-109] 68 (12/26 0731) Resp:  [17-18] 18 (12/26 0731) BP: (113-155)/(56-95) 123/72 (12/26 0731) SpO2:  [98 %-100 %] 100 % (12/26 0731) Last BM Date : 01/25/23  Intake/Output from previous day: 12/25 0701 - 12/26 0700 In: 358 [P.O.:358] Out: 1750 [Urine:1750] Intake/Output this shift: No intake/output data recorded.  PE: Gen:  Alert, NAD, pleasant Pulm: normal work of breathing on room air Abd: soft, ND, +BS, PEG site with some purulent drainage on dressing, LLQ I&D site with penrose drain present and scant SS fluid on dressing, medial I&D site closer to the PEG tube with scant purulent drainage. There is minimal cellulitis and induration but quarter sized area that is more fluctuant today - aspirated at bedside and was able to express a lot of purulent drainage, erythema already improved further after this.     Lab Results:  Recent Labs    01/25/23 0540 01/26/23 0602  WBC 12.7* 14.5*  HGB 8.7* 8.4*  HCT 27.4* 26.2*  PLT 186 217   BMET Recent Labs    01/24/23 0556 01/26/23 0602  NA 131* 129*  K 5.1 5.0  CL 102 97*  CO2 23 24  GLUCOSE 153* 142*  BUN 20 29*  CREATININE 0.64 0.60  CALCIUM 8.7* 8.4*   PT/INR No results for input(s): "LABPROT", "INR" in the last 72 hours. CMP     Component Value Date/Time   NA 129 (L) 01/26/2023 0602   NA 143 12/07/2022 1535   NA 141 04/13/2011 1125   K 5.0 01/26/2023 0602   K 3.4 (L) 04/13/2011 1125   CL 97 (L) 01/26/2023 0602   CL 105 04/13/2011 1125   CO2 24 01/26/2023 0602   CO2 29 04/13/2011 1125   GLUCOSE 142 (H) 01/26/2023 0602   GLUCOSE 111 (H) 04/13/2011 1125   BUN 29 (H) 01/26/2023 0602   BUN 16  12/07/2022 1535   BUN 14 04/13/2011 1125   CREATININE 0.60 01/26/2023 0602   CREATININE 0.80 08/01/2018 1544   CALCIUM 8.4 (L) 01/26/2023 0602   CALCIUM 9.2 04/13/2011 1125   PROT 6.6 01/26/2023 0602   PROT 7.6 12/07/2022 1535   PROT 7.8 04/13/2011 1125   ALBUMIN 2.0 (L) 01/26/2023 0602   ALBUMIN 4.4 12/07/2022 1535   ALBUMIN 4.0 04/13/2011 1125   AST 50 (H) 01/26/2023 0602   AST 24 04/13/2011 1125   ALT 76 (H) 01/26/2023 0602   ALT 20 04/13/2011 1125   ALKPHOS 73 01/26/2023 0602   ALKPHOS 51 04/13/2011 1125   BILITOT 0.4 01/26/2023 0602   BILITOT 0.8 12/07/2022 1535   BILITOT 0.6 04/13/2011 1125   GFRNONAA >60 01/26/2023 0602   GFRNONAA 67 08/01/2018 1544   GFRAA 78 08/01/2018 1544   Lipase  No results found for: "LIPASE"     Studies/Results: DG CHEST PORT 1 VIEW Result Date: 01/25/2023 CLINICAL DATA:  Aspiration into airway EXAM: PORTABLE CHEST 1 VIEW COMPARISON:  01/24/2023 FINDINGS: Shallow inspiration. Cardiac enlargement. Calcification in the mitral valve annulus and aorta. Patchy infiltrates in the left mid and lower lung zone, similar to prior study. This is likely pneumonia, possibly aspiration in the appropriate clinical setting. Right lung is clear.  No pleural effusions. No pneumothorax. Calcification of the aorta. Degenerative changes in the spine and shoulders. IMPRESSION: Persistent infiltration in the left mid and lower lung, similar to prior study. Cardiac enlargement. Electronically Signed   By: Burman Nieves M.D.   On: 01/25/2023 19:55   DG CHEST PORT 1 VIEW Result Date: 01/24/2023 CLINICAL DATA:  CHF EXAM: PORTABLE CHEST 1 VIEW COMPARISON:  01/14/2023 FINDINGS: Heart and mediastinal contours within normal limits. Aortic atherosclerosis. Patchy airspace disease throughout the left lower lung. No confluent opacity on the right. No effusions or acute bony abnormality. IMPRESSION: Patchy left lower lung airspace disease concerning for pneumonia. Electronically  Signed   By: Charlett Nose M.D.   On: 01/24/2023 21:23     Anti-infectives: Anti-infectives (From admission, onward)    Start     Dose/Rate Route Frequency Ordered Stop   01/25/23 0800  ciprofloxacin (CIPRO) tablet 500 mg  Status:  Discontinued        500 mg Oral 2 times daily 01/24/23 0850 01/24/23 2219   01/25/23 0000  azithromycin (ZITHROMAX) 500 mg in sodium chloride 0.9 % 250 mL IVPB        500 mg 250 mL/hr over 60 Minutes Intravenous Every 24 hours 01/24/23 2219     01/24/23 2315  meropenem (MERREM) 1 g in sodium chloride 0.9 % 100 mL IVPB        1 g 200 mL/hr over 30 Minutes Intravenous Every 12 hours 01/24/23 2218     01/17/23 1130  ceFEPIme (MAXIPIME) 2 g in sodium chloride 0.9 % 100 mL IVPB        2 g 200 mL/hr over 30 Minutes Intravenous Every 12 hours 01/17/23 1038 01/24/23 0355   01/16/23 0945  cefTRIAXone (ROCEPHIN) 2 g in sodium chloride 0.9 % 100 mL IVPB  Status:  Discontinued        2 g 200 mL/hr over 30 Minutes Intravenous Every 24 hours 01/16/23 0931 01/17/23 1038        Assessment/Plan Abdominal wall abscess -S/p PEG placement 12/9 Dr. Bedelia Person. PEG dislodged, tract maintained, able to get a foley into the tract. Exchanged for IR PEG 12/16.   -S/p bedside I&D of abdominal wall with penrose drain placement 12/16 by Dr. Janee Morn. S/p bedside I&D of abdominal wall with penrose placement 12/19 by Carlena Bjornstad, PA-C - Tolerating tube feedings at goal. Xray 12/18 confirmed PEG in good location without extravasation. - Cx NGTD - s/p aspiration today at bedside - warm compresses q4h through today/tonight and will monitor, if not improved/resolved tomorrow may need to attempt actual incision and drainage either at bedside or in OR  FEN - D1 diet, tube feeds at goal VTE - SCD's, sq heparin, DOAC held in the setting of ICH ID - Enterobacter Bacteremia 12/15,  cefepime 12/17>>12/24; azithro/merrem 12/24>>   - per [facility] -  Transaminitis - alk phos and bilirubin are WNL;  gallbladder is surgically absent  R SAH  Cerebral aneurysm  HTN emergency Hx ischemic R MCA stroke  A.fib  HLD ODA Osteoarthritis   I reviewed hospitalist notes, last 24 h vitals and pain scores, last 48 h intake and output, and last 24 h labs and trends.   LOS: 12 days   Juliet Rude, [facility] Surgery 01/26/2023, 10:58 AM Please see Amion for pager number during day hours 7:00am-4:30pm

## 2023-01-27 ENCOUNTER — Inpatient Hospital Stay (HOSPITAL_COMMUNITY): Payer: 59 | Admitting: Certified Registered Nurse Anesthetist

## 2023-01-27 ENCOUNTER — Encounter (HOSPITAL_COMMUNITY)
Admission: AD | Disposition: A | Discharge status: Skilled Nursing Facility | Payer: Self-pay | Source: Other Acute Inpatient Hospital | Attending: Internal Medicine

## 2023-01-27 ENCOUNTER — Other Ambulatory Visit: Payer: Self-pay

## 2023-01-27 ENCOUNTER — Encounter (HOSPITAL_COMMUNITY): Payer: Self-pay | Admitting: Neurology

## 2023-01-27 DIAGNOSIS — L02211 Cutaneous abscess of abdominal wall: Secondary | ICD-10-CM | POA: Diagnosis not present

## 2023-01-27 DIAGNOSIS — G4733 Obstructive sleep apnea (adult) (pediatric): Secondary | ICD-10-CM

## 2023-01-27 HISTORY — PX: IRRIGATION AND DEBRIDEMENT ABDOMEN: SHX6600

## 2023-01-27 LAB — GLUCOSE, CAPILLARY
Glucose-Capillary: 138 mg/dL — ABNORMAL HIGH (ref 70–99)
Glucose-Capillary: 101 mg/dL — ABNORMAL HIGH (ref 70–99)
Glucose-Capillary: 103 mg/dL — ABNORMAL HIGH (ref 70–99)
Glucose-Capillary: 118 mg/dL — ABNORMAL HIGH (ref 70–99)
Glucose-Capillary: 201 mg/dL — ABNORMAL HIGH (ref 70–99)

## 2023-01-27 LAB — COMPREHENSIVE METABOLIC PANEL
ALT: 66 U/L — ABNORMAL HIGH (ref 0–44)
AST: 46 U/L — ABNORMAL HIGH (ref 15–41)
Albumin: 1.9 g/dL — ABNORMAL LOW (ref 3.5–5.0)
Alkaline Phosphatase: 67 U/L (ref 38–126)
Anion gap: 8 (ref 5–15)
BUN: 20 mg/dL (ref 8–23)
CO2: 25 mmol/L (ref 22–32)
Calcium: 8.3 mg/dL — ABNORMAL LOW (ref 8.9–10.3)
Chloride: 101 mmol/L (ref 98–111)
Creatinine, Ser: 0.48 mg/dL (ref 0.44–1.00)
GFR, Estimated: >60 mL/min (ref >60)
Glucose, Bld: 129 mg/dL — ABNORMAL HIGH (ref 70–99)
Potassium: 4.7 mmol/L (ref 3.5–5.1)
Sodium: 134 mmol/L — ABNORMAL LOW (ref 135–145)
Total Bilirubin: 0.6 mg/dL (ref <1.2)
Total Protein: 6.3 g/dL — ABNORMAL LOW (ref 6.5–8.1)

## 2023-01-27 LAB — CBC
HCT: 25.8 % — ABNORMAL LOW (ref 36.0–46.0)
Hemoglobin: 8.2 g/dL — ABNORMAL LOW (ref 12.0–15.0)
MCH: 26.7 pg (ref 26.0–34.0)
MCHC: 31.8 g/dL (ref 30.0–36.0)
MCV: 84 fL (ref 80.0–100.0)
Platelets: 217 10*3/uL (ref 150–400)
RBC: 3.07 MIL/uL — ABNORMAL LOW (ref 3.87–5.11)
RDW: 15.7 % — ABNORMAL HIGH (ref 11.5–15.5)
WBC: 13.8 10*3/uL — ABNORMAL HIGH (ref 4.0–10.5)
nRBC: 0 % (ref 0.0–0.2)

## 2023-01-27 SURGERY — IRRIGATION AND DEBRIDEMENT ABDOMEN
Anesthesia: General | Site: Abdomen

## 2023-01-27 MED ORDER — FREE WATER
100.0000 mL | Status: DC
  Administered 2023-01-27 – 2023-02-07 (×67): 100 mL

## 2023-01-27 MED ORDER — ADULT MULTIVITAMIN LIQUID CH
15.0000 mL | Freq: Every day | ORAL | Status: DC
  Administered 2023-01-28 – 2023-02-07 (×11): 15 mL
  Filled 2023-01-27 (×12): qty 15

## 2023-01-27 MED ORDER — METOPROLOL TARTRATE 5 MG/5ML IV SOLN
INTRAVENOUS | Status: AC
  Filled 2023-01-27: qty 5

## 2023-01-27 MED ORDER — BUPIVACAINE-EPINEPHRINE (PF) 0.25% -1:200000 IJ SOLN
INTRAMUSCULAR | Status: AC
  Filled 2023-01-27: qty 30

## 2023-01-27 MED ORDER — 0.9 % SODIUM CHLORIDE (POUR BTL) OPTIME
TOPICAL | Status: DC | PRN
  Administered 2023-01-27: 1000 mL

## 2023-01-27 MED ORDER — FENTANYL CITRATE (PF) 100 MCG/2ML IJ SOLN
25.0000 ug | INTRAMUSCULAR | Status: DC | PRN
Start: 2023-01-27 — End: 2023-01-27

## 2023-01-27 MED ORDER — DEXAMETHASONE SODIUM PHOSPHATE 10 MG/ML IJ SOLN
INTRAMUSCULAR | Status: AC
  Filled 2023-01-27: qty 1

## 2023-01-27 MED ORDER — PHENYLEPHRINE 80 MCG/ML (10ML) SYRINGE FOR IV PUSH (FOR BLOOD PRESSURE SUPPORT)
PREFILLED_SYRINGE | INTRAVENOUS | Status: AC
  Filled 2023-01-27: qty 10

## 2023-01-27 MED ORDER — SODIUM CHLORIDE 0.9 % IV SOLN: INTRAVENOUS | Status: DC | PRN

## 2023-01-27 MED ORDER — ONDANSETRON HCL 4 MG/2ML IJ SOLN
INTRAMUSCULAR | Status: AC
  Filled 2023-01-27: qty 2

## 2023-01-27 MED ORDER — ONDANSETRON HCL 4 MG/2ML IJ SOLN
INTRAMUSCULAR | Status: DC | PRN
  Administered 2023-01-27: 4 mg via INTRAVENOUS

## 2023-01-27 MED ORDER — PROPOFOL 10 MG/ML IV BOLUS
INTRAVENOUS | Status: DC | PRN
  Administered 2023-01-27 (×3): 20 mg via INTRAVENOUS

## 2023-01-27 MED ORDER — DEXAMETHASONE SODIUM PHOSPHATE 10 MG/ML IJ SOLN
INTRAMUSCULAR | Status: DC | PRN
  Administered 2023-01-27: 4 mg via INTRAVENOUS

## 2023-01-27 MED ORDER — VITAMIN C 500 MG PO TABS
250.0000 mg | ORAL_TABLET | Freq: Every day | ORAL | Status: DC
  Administered 2023-01-27 – 2023-02-07 (×12): 250 mg
  Filled 2023-01-27 (×12): qty 1

## 2023-01-27 MED ORDER — PROPOFOL 10 MG/ML IV BOLUS
INTRAVENOUS | Status: AC
Start: 2023-01-27 — End: ?
  Filled 2023-01-27: qty 20

## 2023-01-27 MED ORDER — CHLORHEXIDINE GLUCONATE 0.12 % MT SOLN
OROMUCOSAL | Status: AC
  Filled 2023-01-27: qty 15

## 2023-01-27 MED ORDER — EPHEDRINE 5 MG/ML INJ
INTRAVENOUS | Status: AC
  Filled 2023-01-27: qty 5

## 2023-01-27 MED ORDER — BUPIVACAINE-EPINEPHRINE (PF) 0.25% -1:200000 IJ SOLN
INTRAMUSCULAR | Status: DC | PRN
  Administered 2023-01-27: 30 mL

## 2023-01-27 MED ORDER — ONDANSETRON HCL 4 MG/2ML IJ SOLN: 4.0000 mg | Freq: Once | INTRAMUSCULAR | Status: DC | PRN

## 2023-01-27 MED ORDER — PROPOFOL 500 MG/50ML IV EMUL
INTRAVENOUS | Status: DC | PRN
  Administered 2023-01-27: 50 ug/kg/min via INTRAVENOUS

## 2023-01-27 SURGICAL SUPPLY — 26 items
BAG COUNTER SPONGE SURGICOUNT (BAG) ×1 IMPLANT
BINDER ABDOMINAL 12 ML 46-62 (SOFTGOODS) IMPLANT
BLADE CLIPPER SURG (BLADE) IMPLANT
BNDG GAUZE DERMACEA FLUFF 4 (GAUZE/BANDAGES/DRESSINGS) IMPLANT
BNDG STRETCH 4X75 STRL LF (GAUZE/BANDAGES/DRESSINGS) IMPLANT
CANISTER SUCT 3000ML PPV (MISCELLANEOUS) ×1 IMPLANT
COVER SURGICAL LIGHT HANDLE (MISCELLANEOUS) ×1 IMPLANT
ELECT CAUTERY BLADE 6.4 (BLADE) IMPLANT
ELECT REM PT RETURN 9FT ADLT (ELECTROSURGICAL) ×1 IMPLANT
ELECTRODE REM PT RTRN 9FT ADLT (ELECTROSURGICAL) ×1 IMPLANT
GAUZE PAD ABD 8X10 STRL (GAUZE/BANDAGES/DRESSINGS) IMPLANT
GAUZE SPONGE 4X4 12PLY STRL (GAUZE/BANDAGES/DRESSINGS) IMPLANT
GLOVE BIO SURGEON STRL SZ7 (GLOVE) ×1 IMPLANT
GLOVE BIOGEL PI IND STRL 7.5 (GLOVE) ×1 IMPLANT
GOWN STRL REUS W/ TWL LRG LVL3 (GOWN DISPOSABLE) ×2 IMPLANT
KIT BASIN OR (CUSTOM PROCEDURE TRAY) ×1 IMPLANT
KIT TURNOVER KIT B (KITS) ×1 IMPLANT
NS IRRIG 1000ML POUR BTL (IV SOLUTION) ×1 IMPLANT
PACK GENERAL/GYN (CUSTOM PROCEDURE TRAY) ×1 IMPLANT
PAD ARMBOARD 7.5X6 YLW CONV (MISCELLANEOUS) ×1 IMPLANT
PENCIL SMOKE EVACUATOR (MISCELLANEOUS) ×1 IMPLANT
SWAB COLLECTION DEVICE MRSA (MISCELLANEOUS) IMPLANT
SWAB CULTURE ESWAB REG 1ML (MISCELLANEOUS) IMPLANT
TAPE CLOTH SURG 4X10 WHT LF (GAUZE/BANDAGES/DRESSINGS) IMPLANT
TOWEL GREEN STERILE (TOWEL DISPOSABLE) ×1 IMPLANT
TOWEL GREEN STERILE FF (TOWEL DISPOSABLE) ×1 IMPLANT

## 2023-01-27 NOTE — Progress Notes (Signed)
Progress Note   Patient: Jocelyn [name] NFA:[id number] DOB: 1931/08/18 DOA: 01/14/2023     13 DOS: the patient was seen and examined on 01/27/2023   Brief hospital course: 89 year old with a history of OA bilateral knees, HTN, GERD, chronic iron deficiency anemia, HLD, OSA on CPAP, pre-DM, and chronic atrial fibrillation on Eliquis who was discharged from the Stroke Service at [facility] 12/11 after management of an acute right MCA territory infarct related to her atrial fibrillation.  During that preceding admission she presented with left-sided weakness and was found to have a right A1 and M2 occlusion for which she underwent successful mechanical thrombectomy/revascularization.  She was ultimately discharged to a SNF.   She returned to the ER at [facility] from her rehab facility on 12/14 after developing a new left-sided facial droop and left arm numbness with weakness.  CT head at presentation revealed interval development of scattered subarachnoid hemorrhage in the right sylvian fissure.  Her Eliquis was reversed with Kcentra.  CTa revealed no LVO but atheromatous plaque with outpouchings extending posteriorly and inferiorly from both supraclinoid ICAs possibly representing small aneurysms.   Significant Events:  12/14 Admitted with [facility] 12/15 dislodged PEG tube 12/15 long term EEG w/o evidence of seizure activity  12/16 transferred out of the ICU, incision and drainage of left abdominal wall infection. 12/17 blood cultures positive for Enterobacter cloacae, cefepime started 12/19 additional I&D of abdominal wall performed 12/21 [facility] assumed care of patient  Assessment and Plan: Subarachnoid hemorrhage - possible cerebral aneurysm Noted at time of this readmission - occurred while on Eliquis therapy - imaging has suggested outpouchings extending posteriorly and inferiorly from both supraclinoid ICAs which could reflect small aneurysms - these were noted on CTa as well as MRa - yearly MRa is  recommended in follow-up by the Stroke Team. Per Neurology note, now not on antithrombotic   Recent CVA Admitted to the hospital 11/28-12/12/2022 for treatment of a right MCA territory infarct felt to be cardioembolic due to A-fib - an acute clot was noted at the right terminal ICA and the patient underwent intervention with subsequent revascularization   Abdominal wall infection General Surgery has been following with I&D performed 12/16 with Penrose drain placement and again 12/19 with placement of a second Penrose drain -wound care per General Surgery - IV antibiotic therapy resumed.  -pt taken to OR today for I/D   Enterobacter bacteremia Noted on 1 of 2 blood cultures drawn 12/15 - third-generation cephalosporin and Zosyn resistant - had earlier completed 7 full days of cefepime therapy -Repeat blood cx thus far neg   Dysphagia due to stroke PEG tube originally placed 12/9 during prior admission - this became dislodged 12/15 with the tract temporarily preserved using a Foley catheter which was subsequently exchanged for a replacement PEG tube in IR 12/16 -Re-evaluated by SLP 12/26 given concerns of aspiration. Cleared for dysphagia 1   Abnormal EEG Routine EEG 12/14 raised the question of cortical dysfunction arising from the right hemisphere but with no evidence of seizure activity - long-term EEG revealed no evidence of active seizure activity   Hypertensive emergency Blood pressure presently well-controlled   Chronic atrial fibrillation Eliquis discontinued due to [facility] - rate controlled   Transaminitis Statin on hold - LFTs steadily improving - follow intermittently and resume statin once normalized   HLD LDL 33 - statin discontinued 12/17 due to elevated LFTs  Aspiration Pneumonia with sepsis not present on admit -Pt febrile with leukocytosis, recently tachycardic -Concern for  new L sided PNA on CXR, reviewed. Suspicious for aspiration -continue broad spectrum  abx -Appreciate SLP input. Pt has since been clear for dysphagia 1, however pt was observed closing eyes during trial, which concerned SLP for increased risk for aspiration -Agree with keeping HOB elevated >30 degrees -Cont with nystatin swish/swallow to cover oral candidiasis for now -Clinically seems improved today      Subjective: Without complaints this AM  Physical Exam: Vitals:   01/27/23 0424 01/27/23 0757 01/27/23 1133 01/27/23 1233  BP: 130/67 139/63 (!) 142/59 (!) 115/58  Pulse: 98 90 99 (!) 111  Resp: 16 18 18 18   Temp: 98.2 F (36.8 C) 98.5 F (36.9 C) 98.2 F (36.8 C) 98.9 F (37.2 C)  TempSrc: Oral Oral Oral   SpO2: 99% 100% 100% 97%  Weight:      Height:       General exam: Awake, laying in bed, in nad Respiratory system: Normal respiratory effort, no wheezing Cardiovascular system: regular rate, s1, s2 Gastrointestinal system: Soft, nondistended, positive BS Central nervous system: CN2-12 grossly intact, strength intact Extremities: Perfused, no clubbing Skin: Normal skin turgor, no notable skin lesions seen Psychiatry: Mood normal // no visual hallucinations   Data Reviewed:  Labs reviewed: Na 134, K 4.7, Cr 0.48, WBC 13.8, Hgb 8.2  Family Communication: Pt in room, family not at bedside  Disposition: Status is: Inpatient Remains inpatient appropriate because: severity of illness  Planned Discharge Destination: Skilled nursing facility    Author: Rickey Barbara, MD 01/27/2023 2:04 PM  For on call review [email or web address].

## 2023-01-27 NOTE — Anesthesia Procedure Notes (Signed)
Procedure Name: MAC Date/Time: 01/27/2023 1:29 PM  Performed by: Alease Medina, CRNAPre-anesthesia Checklist: Patient identified, Emergency Drugs available, Suction available and Patient being monitored Patient Re-evaluated:Patient Re-evaluated prior to induction Oxygen Delivery Method: Simple face mask

## 2023-01-27 NOTE — Progress Notes (Signed)
OT Cancellation Note  Patient Details Name: SAPHYRA BOST MRN: [id number] DOB: 12-13-1931   Cancelled Treatment:    Reason Eval/Treat Not Completed: Pain limiting ability to participate (I&D)  Donia Pounds 01/27/2023, 1:14 PM

## 2023-01-27 NOTE — Progress Notes (Signed)
Nutrition Follow-up  DOCUMENTATION CODES:  Non-severe (moderate) malnutrition in context of chronic illness  INTERVENTION:  Restart tube feeding via PEG after procedure: Osmolite 1.2 at 55 ml/h (1320 ml per day) Free water flush q4h  Provides 1584 kcal, 73 gm protein, 1082 ml free water daily (TF+flush = free water) Banatrol BID via tube to provide 5g of fiber per packet MVI with minerals daily Vitamin C 250mg  x 30 days for wounds and presence of scorbutic tongue  NUTRITION DIAGNOSIS:  Moderate Malnutrition related to chronic illness (stroke, dysphagia) as evidenced by mild fat depletion, moderate fat depletion, moderate muscle depletion, severe muscle depletion. - remains applicable  GOAL:  Patient will meet greater than or equal to 90% of their needs - progressing, being met with TF at goal  MONITOR:  TF tolerance, Diet advancement, Labs, Weight trends  REASON FOR ASSESSMENT:  Consult Enteral/tube feeding initiation and management  ASSESSMENT:  89 yo female with L sided weakness and confusion with SAH. Pt with recent discharge from the hospital after admission for acute R MCA infarct. Pt with hx of GERD, HTN. Pt has PEG tube in place  12/9 - PEG placed (last admission) 12/14 - Re-admitted with [facility] 12/15 - PEG dislodged 12/16 - PEG replaced in IR, bedside I&D of abdominal wall with penrose drain placement 12/19 - bedside I&D of abdominal wall, penrose drain sutured to skin 12/27 - repeat debridement pending today  Pt resting in bed at the time of assessment. Asked when she could have something to eat, no family at bedside. Pt has been NPO since midnight and TF are off. RN reports that debridement is happening this afternoon but that surgery was deciding between bedside versus OR. Likely will be taken to OR.   Advised that once pt returns from procedure, TF can be resumed at goal rate. Noted that pt with new breakdown on the sacrum since admission and image from  12/26 from SLP could be scorbutic tongue. Added vitamin C x 30 days.   Admit weight: 64.3 kg Current weight: 67.8 kg    Nutritionally Relevant Medications: Scheduled Meds:  calcium-vitamin D  1 tablet Per Tube BID   famotidine  20 mg Per Tube Daily   ferrous sulfate  60 mg of iron Per Tube Daily   fiber supplement (BANATROL TF)  60 mL Per Tube BID   free water  200 mL Per Tube Q4H   insulin aspart  0-15 Units Subcutaneous Q8H   Continuous Infusions:  sodium chloride 50 mL/hr at 01/26/23 1308   azithromycin 500 mg (01/27/23 0133)   feeding supplement (OSMOLITE 1.2 CAL) 55 mL/hr at 01/26/23 1330   meropenem (MERREM) IV 1 g (01/26/23 2259)   Labs Reviewed: Na 134 CBG ranges from 120-169 mg/dL over the last 24 hours HgbA1c 6.4% (11/29)  NUTRITION - FOCUSED PHYSICAL EXAM: Flowsheet Row Most Recent Value  Orbital Region Mild depletion  Upper Arm Region Mild depletion  Thoracic and Lumbar Region No depletion  Buccal Region Mild depletion  Temple Region No depletion  Clavicle Bone Region Moderate depletion  Clavicle and Acromion Bone Region Moderate depletion  Scapular Bone Region Severe depletion  Dorsal Hand Mild depletion  Patellar Region Unable to assess  Anterior Thigh Region Severe depletion  Posterior Calf Region Severe depletion  Edema (RD Assessment) Mild  Hair Reviewed  Eyes Reviewed  Mouth Reviewed  Skin Reviewed  Nails Reviewed   Diet Order:   Diet Order  Diet NPO time specified  Diet effective now                   EDUCATION NEEDS:  Not appropriate for education at this time  Skin:  Skin Assessment: Reviewed RN Assessment (no pressure injuries) Stage 2: - sacrum (0.1 x 0.1 x 0.1 cm) Cellulitis: - Abdominal wall Deep Tissue Injury: - Left heel (5 x 4 cm)  Last BM:  12/27 - type 6  Height:  Ht Readings from Last 1 Encounters:  01/19/23 5\' 3"  (1.6 m)    Weight:  Wt Readings from Last 1 Encounters:  01/26/23 67.8 kg     Ideal Body Weight:  52.3 kg  BMI:  Body mass index is 26.48 kg/m.  Estimated Nutritional Needs:  Kcal:  1450-1650 kcals Protein:  70-80 g Fluid:  >/= 1.5 L    Greig Castilla, RD, LDN Registered Dietitian II Please reach out via secure chat Weekend on-call pager # available in [facility]

## 2023-01-27 NOTE — Op Note (Addendum)
IRRIGATION AND DEBRIDEMENT ABDOMEN wall abcess  Operative Note (CSN: [id number])  Service  Date of Surgery: 01/27/2023 Admit Date: 01/14/2023 Performing Service: General Surgeons and Role:    Azucena Cecil, Benetta Spar, MD - Primary  Op Note Pre-op Diagnosis: Abdominal Abscess Post-op Diagnosis: same  Procedure(s): IRRIGATION AND DEBRIDEMENT ABDOMEN wall abcess  Findings: 15 cc of pus in newer abscess between 2 drained abscesses that were previously I&D'd at bedside. Debrided some infected appearing tissue in addition to draining pus and connected cavities of all 3 abscesses. Packed with kerlix. Wound size total 7x3cm  Anesthesia: Monitor Anesthesia Care Estimated Blood Loss: 5 mL  Complications: None Specimens:  ID Type Source Tests Collected by Time Destination  A : swab from abdominal wall abcess Body Fluid Path fluid AEROBIC/ANAEROBIC CULTURE W GRAM STAIN (SURGICAL/DEEP WOUND) Lysle Rubens, MD 01/27/2023 1339     Brief history / Indications for Surgery: History of PEG complicated by dislodgement and associated abdominal wall abscesses. Continued infection despite bedside I&Ds.  Procedure Details  Prior to the procedure, the risks, benefits, complications, treatment options, and expected outcomes were discussed with the patient and/or family, including but not limited to, the risks of bleeding, infection, and recurrent abscess, undrained abscess.  Despite the risks, the son, Jocelyn [name], has given informed consent for operative intervention.  The patient was taken to the Operating Room, identified as Jocelyn [name] and the procedure verified as incision and drainage of abdominal wall abscess.  Identification pause was held and the above information confirmed.  The patient was placed in the supine position and Monitored Local Anesthesia with Sedation was induced. Previous penrose drains were removed.  The patient was prepped with betadine and draped in the typical sterile fashion.   A formal preincision time out was performed.  The new left lateral abscess was incised and drained. Cultures sent from fluid. The abscess cavities of the previous abscesses (one medial and one inferolateral to new abscess) were connected using electrocautery. Some infected appearing tissue was debrided. The wound was irrigated and hemostasis achieved. Wound then packed with kerlix and covered with gauze and ABD.  Instrument, sponge, and needle counts were correct at the conclusion of the case.   Post-op: Continue IV antibiotics.  We will perform first dressing change tomorrow. Called son post-op and discussed plan of care, all questions answered. OK for tube feeds.   Lysle Rubens, MD Date: 01/27/2023  Time: 1:59 PM

## 2023-01-27 NOTE — Anesthesia Preprocedure Evaluation (Signed)
Anesthesia Evaluation  Patient identified by MRN, date of birth, ID band Patient awake    Reviewed: Allergy & Precautions, H&P , NPO status , Patient's Chart, lab work & pertinent test results  Airway Mallampati: II  TM Distance: >3 FB Neck ROM: Full    Dental  (+) Edentulous Upper, Edentulous Lower   Pulmonary sleep apnea and Continuous Positive Airway Pressure Ventilation    Pulmonary exam normal breath sounds clear to auscultation       Cardiovascular hypertension, + CAD  Normal cardiovascular exam Rhythm:Regular Rate:Normal     Neuro/Psych CVA, Residual Symptoms  negative psych ROS   GI/Hepatic negative GI ROS, Neg liver ROS,,,  Endo/Other  negative endocrine ROS    Renal/GU negative Renal ROS  negative genitourinary   Musculoskeletal negative musculoskeletal ROS (+)    Abdominal   Peds negative pediatric ROS (+)  Hematology negative hematology ROS (+)   Anesthesia Other Findings   Reproductive/Obstetrics negative OB ROS                             Anesthesia Physical Anesthesia Plan  ASA: 4  Anesthesia Plan: General   Post-op Pain Management: Minimal or no pain anticipated   Induction: Intravenous  PONV Risk Score and Plan: 3 and Ondansetron, Treatment may vary due to age or medical condition and Dexamethasone  Airway Management Planned: Oral ETT  Additional Equipment:   Intra-op Plan:   Post-operative Plan: Extubation in OR  Informed Consent: I have reviewed the patients History and Physical, chart, labs and discussed the procedure including the risks, benefits and alternatives for the proposed anesthesia with the patient or authorized representative who has indicated his/her understanding and acceptance.     Dental advisory given  Plan Discussed with: CRNA and Surgeon  Anesthesia Plan Comments:        Anesthesia Quick Evaluation

## 2023-01-27 NOTE — TOC Progression Note (Signed)
Transition of Care [facility]) - Progression Note    Patient Details  Name: Jocelyn [name] MRN: [id number] Date of Birth: 01-Jun-1931  Transition of Care [facility]) CM/SW Contact  Baldemar Lenis, [name] Phone Number: 01/27/2023, 12:46 PM  Clinical Narrative:   CSW following for disposition. Patient continues to be not medically ready for SNF at this time. CSW to follow.    Expected Discharge Plan: Skilled Nursing Facility Barriers to Discharge: Continued Medical Work up, English as a second language teacher  Expected Discharge Plan and Services                                               Social Determinants of Health (SDOH) Interventions SDOH Screenings   Food Insecurity: Patient Unable To Answer (01/14/2023)  Housing: Patient Unable To Answer (01/14/2023)  Transportation Needs: Unknown (01/14/2023)  Utilities: Patient Unable To Answer (01/14/2023)  Alcohol Screen: Low Risk  (12/03/2018)  Depression (PHQ2-9): Low Risk  (12/06/2022)  Financial Resource Strain: Medium Risk (07/26/2018)  Physical Activity: Inactive (07/26/2018)  Social Connections: Moderately Isolated (07/26/2018)  Stress: No Stress Concern Present (07/26/2018)  Tobacco Use: Low Risk  (01/27/2023)    Readmission Risk Interventions     No data to display

## 2023-01-27 NOTE — Progress Notes (Signed)
SLP Cancellation Note  Patient Details Name: Jocelyn [name] MRN: [id number] DOB: [birth year]   Cancelled treatment:       Reason Eval/Treat Not Completed: Other (comment) (pt now NPO and TFs stopped, ? if for potential procedure? will continue efforts)  Rolena Infante, MS [facility] SLP Acute Rehab Services Office [phone]  Chales Abrahams 01/27/2023, 8:00 AM

## 2023-01-27 NOTE — Transfer of Care (Signed)
Immediate Anesthesia Transfer of Care Note  Patient: Jocelyn [name]  Procedure(s) Performed: IRRIGATION AND DEBRIDEMENT ABDOMEN wall abcess (Abdomen)  Patient Location: PACU  Anesthesia Type:MAC  Level of Consciousness: drowsy and patient cooperative  Airway & Oxygen Therapy: Patient Spontanous Breathing  Post-op Assessment: Report given to RN, Post -op Vital signs reviewed and stable, and Patient moving all extremities X 4  Post vital signs: Reviewed and stable  Last Vitals:  Vitals Value Taken Time  BP 137/79 01/27/23 1415  Temp    Pulse 115 01/27/23 1419  Resp 22 01/27/23 1419  SpO2 93 % 01/27/23 1419  Vitals shown include unfiled device data.  Last Pain:  Vitals:   01/27/23 1410  TempSrc:   PainSc: 0-No pain      Patients Stated Pain Goal: 0 (01/22/23 0011)  Complications: No notable events documented.

## 2023-01-27 NOTE — Anesthesia Postprocedure Evaluation (Signed)
Anesthesia Post Note  Patient: Jocelyn [name]  Procedure(s) Performed: IRRIGATION AND DEBRIDEMENT ABDOMEN wall abcess (Abdomen)     Patient location during evaluation: PACU Anesthesia Type: MAC Level of consciousness: awake and alert Pain management: pain level controlled Vital Signs Assessment: post-procedure vital signs reviewed and stable Respiratory status: spontaneous breathing, nonlabored ventilation, respiratory function stable and patient connected to nasal cannula oxygen Cardiovascular status: stable and blood pressure returned to baseline Postop Assessment: no apparent nausea or vomiting Anesthetic complications: no  No notable events documented.  Last Vitals:  Vitals:   01/27/23 1430 01/27/23 1445  BP: (!) 136/59 125/66  Pulse: (!) 118 (!) 108  Resp: (!) 28 (!) 25  Temp:  36.9 C  SpO2: 97% 97%    Last Pain:  Vitals:   01/27/23 1430  TempSrc:   PainSc: 0-No pain                 [place] Frieden S

## 2023-01-27 NOTE — Progress Notes (Addendum)
CCC Pre-op Review  Pre-op checklist: To be completed by bedside RN  NPO: Ordered  Labs: Hgb 8.2  Consent: Ordered. Telephone consent will need to be done. Bedside RN aware.  H&P: Neurology 12/14  Vitals: WNL  O2 requirements: RA   MAR/PTA review: heparin given @0631 , Tube feeding turned off at midnight. Verified with bedside nurse. No flushes given as well  IV: 20g LUE  Floor nurse name:  Neldon Newport, RN   Additional info:  Pt is not A&O.

## 2023-01-27 NOTE — Progress Notes (Signed)
[facility] Progress Note  * Day of Surgery *  Subjective: Still having some pain at abdominal wall infection. Oriented to self and situation. States the year is 2045  Objective: Vital signs in last 24 hours: Temp:  [98.2 F (36.8 C)-100 F (37.8 C)] 98.5 F (36.9 C) (12/27 0757) Pulse Rate:  [84-105] 90 (12/27 0757) Resp:  [16-18] 18 (12/27 0757) BP: (116-140)/(52-67) 139/63 (12/27 0757) SpO2:  [99 %-100 %] 100 % (12/27 0757) Weight:  [67.8 kg] 67.8 kg (12/26 1400) Last BM Date : 01/27/23  Intake/Output from previous day: 12/26 0701 - 12/27 0700 In: 1130.9 [I.V.:270.4; NG/GT:860.5] Out: 250 [Urine:250] Intake/Output this shift: Total I/O In: -  Out: 850 [Urine:850]  PE: Gen:  Alert, NAD, pleasant Pulm: normal work of breathing on supplemental O2 via [place] Abd: soft, ND, +BS, PEG site with some purulent drainage on dressing, LLQ I&D site with penrose drain present and scant SS fluid on dressing, medial I&D site closer to the PEG tube with scant purulent drainage. There is cellulitis and induration with central fluctuance at site between the two I&Ds which was aspirated yesterday and is draining some purulence today    Lab Results:  Recent Labs    01/26/23 0602 01/27/23 0628  WBC 14.5* 13.8*  HGB 8.4* 8.2*  HCT 26.2* 25.8*  PLT 217 217   BMET Recent Labs    01/26/23 0602 01/27/23 0628  NA 129* 134*  K 5.0 4.7  CL 97* 101  CO2 24 25  GLUCOSE 142* 129*  BUN 29* 20  CREATININE 0.60 0.48  CALCIUM 8.4* 8.3*   PT/INR No results for input(s): "LABPROT", "INR" in the last 72 hours. CMP     Component Value Date/Time   NA 134 (L) 01/27/2023 0628   NA 143 12/07/2022 1535   NA 141 04/13/2011 1125   K 4.7 01/27/2023 0628   K 3.4 (L) 04/13/2011 1125   CL 101 01/27/2023 0628   CL 105 04/13/2011 1125   CO2 25 01/27/2023 0628   CO2 29 04/13/2011 1125   GLUCOSE 129 (H) 01/27/2023 0628   GLUCOSE 111 (H) 04/13/2011 1125   BUN 20 01/27/2023 0628   BUN  16 12/07/2022 1535   BUN 14 04/13/2011 1125   CREATININE 0.48 01/27/2023 0628   CREATININE 0.80 08/01/2018 1544   CALCIUM 8.3 (L) 01/27/2023 0628   CALCIUM 9.2 04/13/2011 1125   PROT 6.3 (L) 01/27/2023 0628   PROT 7.6 12/07/2022 1535   PROT 7.8 04/13/2011 1125   ALBUMIN 1.9 (L) 01/27/2023 0628   ALBUMIN 4.4 12/07/2022 1535   ALBUMIN 4.0 04/13/2011 1125   AST 46 (H) 01/27/2023 0628   AST 24 04/13/2011 1125   ALT 66 (H) 01/27/2023 0628   ALT 20 04/13/2011 1125   ALKPHOS 67 01/27/2023 0628   ALKPHOS 51 04/13/2011 1125   BILITOT 0.6 01/27/2023 0628   BILITOT 0.8 12/07/2022 1535   BILITOT 0.6 04/13/2011 1125   GFRNONAA >60 01/27/2023 0628   GFRNONAA 67 08/01/2018 1544   GFRAA 78 08/01/2018 1544   Lipase  No results found for: "LIPASE"     Studies/Results: DG CHEST PORT 1 VIEW Result Date: 01/25/2023 CLINICAL DATA:  Aspiration into airway EXAM: PORTABLE CHEST 1 VIEW COMPARISON:  01/24/2023 FINDINGS: Shallow inspiration. Cardiac enlargement. Calcification in the mitral valve annulus and aorta. Patchy infiltrates in the left mid and lower lung zone, similar to prior study. This is likely pneumonia, possibly aspiration in the appropriate clinical setting. Right lung is  clear. No pleural effusions. No pneumothorax. Calcification of the aorta. Degenerative changes in the spine and shoulders. IMPRESSION: Persistent infiltration in the left mid and lower lung, similar to prior study. Cardiac enlargement. Electronically Signed   By: Burman Nieves M.D.   On: 01/25/2023 19:55     Anti-infectives: Anti-infectives (From admission, onward)    Start     Dose/Rate Route Frequency Ordered Stop   01/25/23 0800  ciprofloxacin (CIPRO) tablet 500 mg  Status:  Discontinued        500 mg Oral 2 times daily 01/24/23 0850 01/24/23 2219   01/25/23 0000  azithromycin (ZITHROMAX) 500 mg in sodium chloride 0.9 % 250 mL IVPB        500 mg 250 mL/hr over 60 Minutes Intravenous Every 24 hours 01/24/23  2219     01/24/23 2315  meropenem (MERREM) 1 g in sodium chloride 0.9 % 100 mL IVPB        1 g 200 mL/hr over 30 Minutes Intravenous Every 12 hours 01/24/23 2218     01/17/23 1130  ceFEPIme (MAXIPIME) 2 g in sodium chloride 0.9 % 100 mL IVPB        2 g 200 mL/hr over 30 Minutes Intravenous Every 12 hours 01/17/23 1038 01/24/23 0355   01/16/23 0945  cefTRIAXone (ROCEPHIN) 2 g in sodium chloride 0.9 % 100 mL IVPB  Status:  Discontinued        2 g 200 mL/hr over 30 Minutes Intravenous Every 24 hours 01/16/23 0931 01/17/23 1038        Assessment/Plan Abdominal wall abscess -S/p PEG placement 12/9 Dr. Bedelia Person. PEG dislodged, tract maintained, able to get a foley into the tract. Exchanged for IR PEG 12/16.   -S/p bedside I&D of abdominal wall with penrose drain placement 12/16 by Dr. Janee Morn. S/p bedside I&D of abdominal wall with penrose placement 12/19 by Carlena Bjornstad, PA-C - Tolerating tube feedings at goal. Xray 12/18 confirmed PEG in good location without extravasation. - Cx NGTD - s/p aspiration today at bedside 12/26 - on exam today there is ongoing cellulitis, fluctuance and purulent drainage. Recommend OR today for I&D for adequate drainage. This was discussed with patient and her son via phone  FEN - Tfs held for OR VTE - SCD's, sq heparin, DOAC held in the setting of ICH ID - Enterobacter Bacteremia 12/15,  cefepime 12/17>>12/24; azithro/merrem 12/24>>   - per TRH -  Transaminitis - alk phos and bilirubin are WNL; gallbladder is surgically absent  R SAH  Cerebral aneurysm  HTN emergency Hx ischemic R MCA stroke  A.fib  HLD ODA Osteoarthritis   I reviewed hospitalist notes, last 24 h vitals and pain scores, last 48 h intake and output, and last 24 h labs and trends.   LOS: 13 days   Eric Form, [facility] Surgery 01/27/2023, 10:52 AM Please see Amion for pager number during day hours 7:00am-4:30pm

## 2023-01-28 ENCOUNTER — Encounter (HOSPITAL_COMMUNITY): Payer: Self-pay | Admitting: General Surgery

## 2023-01-28 DIAGNOSIS — L02211 Cutaneous abscess of abdominal wall: Secondary | ICD-10-CM | POA: Diagnosis not present

## 2023-01-28 LAB — BLOOD CULTURE ID PANEL (REFLEXED) - BCID2

## 2023-01-28 LAB — CBC
HCT: 26.3 % — ABNORMAL LOW (ref 36.0–46.0)
Hemoglobin: 8.6 g/dL — ABNORMAL LOW (ref 12.0–15.0)
MCH: 27.2 pg (ref 26.0–34.0)
MCHC: 32.7 g/dL (ref 30.0–36.0)
MCV: 83.2 fL (ref 80.0–100.0)
Platelets: 246 10*3/uL (ref 150–400)
RBC: 3.16 MIL/uL — ABNORMAL LOW (ref 3.87–5.11)
RDW: 15.9 % — ABNORMAL HIGH (ref 11.5–15.5)
WBC: 13 10*3/uL — ABNORMAL HIGH (ref 4.0–10.5)
nRBC: 0 % (ref 0.0–0.2)

## 2023-01-28 LAB — COMPREHENSIVE METABOLIC PANEL
ALT: 60 U/L — ABNORMAL HIGH (ref 0–44)
AST: 43 U/L — ABNORMAL HIGH (ref 15–41)
Albumin: 2 g/dL — ABNORMAL LOW (ref 3.5–5.0)
Alkaline Phosphatase: 74 U/L (ref 38–126)
Anion gap: 9 (ref 5–15)
BUN: 21 mg/dL (ref 8–23)
CO2: 23 mmol/L (ref 22–32)
Calcium: 8.6 mg/dL — ABNORMAL LOW (ref 8.9–10.3)
Chloride: 102 mmol/L (ref 98–111)
Creatinine, Ser: 0.53 mg/dL (ref 0.44–1.00)
GFR, Estimated: >60 mL/min (ref >60)
Glucose, Bld: 165 mg/dL — ABNORMAL HIGH (ref 70–99)
Potassium: 5.3 mmol/L — ABNORMAL HIGH (ref 3.5–5.1)
Sodium: 134 mmol/L — ABNORMAL LOW (ref 135–145)
Total Bilirubin: 0.5 mg/dL (ref <1.2)
Total Protein: 6.6 g/dL (ref 6.5–8.1)

## 2023-01-28 LAB — GLUCOSE, CAPILLARY
Glucose-Capillary: 153 mg/dL — ABNORMAL HIGH (ref 70–99)
Glucose-Capillary: 136 mg/dL — ABNORMAL HIGH (ref 70–99)
Glucose-Capillary: 146 mg/dL — ABNORMAL HIGH (ref 70–99)

## 2023-01-28 MED ORDER — SODIUM ZIRCONIUM CYCLOSILICATE 10 G PO PACK
10.0000 g | PACK | Freq: Once | ORAL | Status: AC
  Administered 2023-01-28: 10 g
  Filled 2023-01-28: qty 1

## 2023-01-28 NOTE — Progress Notes (Signed)
   01/28/23 2100  BiPAP/CPAP/SIPAP  Reason BIPAP/CPAP not in use Non-compliant (RN made aware)

## 2023-01-28 NOTE — Progress Notes (Signed)
    1 Day Post-Op  Subjective: Afebrile, WBC stable at 13.  ROS: See above, otherwise other systems negative  Objective: Vital signs in last 24 hours: Temp:  [98.3 F (36.8 C)-98.9 F (37.2 C)] 98.4 F (36.9 C) (12/28 1138) Pulse Rate:  [80-118] 80 (12/28 1138) Resp:  [16-31] 22 (12/28 1138) BP: (118-157)/(50-79) 118/50 (12/28 1138) SpO2:  [96 %-100 %] 100 % (12/28 1138) Last BM Date : 01/28/23  Intake/Output from previous day: 12/27 0701 - 12/28 0700 In: 2225.3 [I.V.:500; IV Piggyback:1725.3] Out: 1455 [Urine:1450; Blood:5] Intake/Output this shift: Total I/O In: -  Out: 300 [Urine:300]  PE: General: resting comfortably, NAD Resp: normal work of breathing Abdomen: G tube in place with feeds running. LUQ wound is clean and dry, no bleeding or purulent drainage, no surrounding cellulitis.   Lab Results:  Recent Labs    01/27/23 0628 01/28/23 0434  WBC 13.8* 13.0*  HGB 8.2* 8.6*  HCT 25.8* 26.3*  PLT 217 246   BMET Recent Labs    01/27/23 0628 01/28/23 0434  NA 134* 134*  K 4.7 5.3*  CL 101 102  CO2 25 23  GLUCOSE 129* 165*  BUN 20 21  CREATININE 0.48 0.53  CALCIUM 8.3* 8.6*   PT/INR No results for input(s): "LABPROT", "INR" in the last 72 hours. CMP     Component Value Date/Time   NA 134 (L) 01/28/2023 0434   NA 143 12/07/2022 1535   NA 141 04/13/2011 1125   K 5.3 (H) 01/28/2023 0434   K 3.4 (L) 04/13/2011 1125   CL 102 01/28/2023 0434   CL 105 04/13/2011 1125   CO2 23 01/28/2023 0434   CO2 29 04/13/2011 1125   GLUCOSE 165 (H) 01/28/2023 0434   GLUCOSE 111 (H) 04/13/2011 1125   BUN 21 01/28/2023 0434   BUN 16 12/07/2022 1535   BUN 14 04/13/2011 1125   CREATININE 0.53 01/28/2023 0434   CREATININE 0.80 08/01/2018 1544   CALCIUM 8.6 (L) 01/28/2023 0434   CALCIUM 9.2 04/13/2011 1125   PROT 6.6 01/28/2023 0434   PROT 7.6 12/07/2022 1535   PROT 7.8 04/13/2011 1125   ALBUMIN 2.0 (L) 01/28/2023 0434   ALBUMIN 4.4 12/07/2022 1535   ALBUMIN  4.0 04/13/2011 1125   AST 43 (H) 01/28/2023 0434   AST 24 04/13/2011 1125   ALT 60 (H) 01/28/2023 0434   ALT 20 04/13/2011 1125   ALKPHOS 74 01/28/2023 0434   ALKPHOS 51 04/13/2011 1125   BILITOT 0.5 01/28/2023 0434   BILITOT 0.8 12/07/2022 1535   BILITOT 0.6 04/13/2011 1125   GFRNONAA >60 01/28/2023 0434   GFRNONAA 67 08/01/2018 1544   GFRAA 78 08/01/2018 1544   Lipase  No results found for: "LIPASE"    Assessment/Plan 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 Replaced by IR 12/16 after becoming dislodged, followed by bedside I&D x2. Now POD1 s/p operative debridement 12/27. - Ok to continue using G tube for feeds and meds - Wound care: saline WTD dressings to LUQ wound BID - Intra-op cultures pending - Remainder of care per primary team    LOS: 14 days    Sophronia Simas, MD [facility] Surgery General, Hepatobiliary and Pancreatic Surgery 01/28/23 1:11 PM

## 2023-01-28 NOTE — Progress Notes (Signed)
PHARMACY - PHYSICIAN COMMUNICATION CRITICAL VALUE ALERT - BLOOD CULTURE IDENTIFICATION (BCID)  Jocelyn [name] is an 89 y.o. female who presented to [facility] on 01/14/2023 with a chief complaint of right MCA infarct and intra abdominal abscess  Assessment:  Blood cultures no 12/24 growing 1/4 staph epidermidis in anaerobic bottle (likely contaminant)  Name of physician (or Provider) ContactedRhona Leavens  Current antibiotics: Meropenem and Azithromycin  Changes to prescribed antibiotics recommended:  Patient on appropriate antibiotics, no change warranted per MD  Results for orders placed or performed during the hospital encounter of 01/14/23  Blood Culture ID Panel (Reflexed) (Collected: 01/24/2023  5:55 AM)  Result Value Ref Range   Enterococcus faecalis NOT DETECTED NOT DETECTED   Enterococcus Faecium NOT DETECTED NOT DETECTED   Listeria monocytogenes NOT DETECTED NOT DETECTED   Staphylococcus species DETECTED (A) NOT DETECTED   Staphylococcus aureus (BCID) NOT DETECTED NOT DETECTED   Staphylococcus epidermidis DETECTED (A) NOT DETECTED   Staphylococcus lugdunensis NOT DETECTED NOT DETECTED   Streptococcus species NOT DETECTED NOT DETECTED   Streptococcus agalactiae NOT DETECTED NOT DETECTED   Streptococcus pneumoniae NOT DETECTED NOT DETECTED   Streptococcus pyogenes NOT DETECTED NOT DETECTED   A.calcoaceticus-baumannii NOT DETECTED NOT DETECTED   Bacteroides fragilis NOT DETECTED NOT DETECTED   Enterobacterales NOT DETECTED NOT DETECTED   Enterobacter cloacae complex NOT DETECTED NOT DETECTED   Escherichia coli NOT DETECTED NOT DETECTED   Klebsiella aerogenes NOT DETECTED NOT DETECTED   Klebsiella oxytoca NOT DETECTED NOT DETECTED   Klebsiella pneumoniae NOT DETECTED NOT DETECTED   Proteus species NOT DETECTED NOT DETECTED   Salmonella species NOT DETECTED NOT DETECTED   Serratia marcescens NOT DETECTED NOT DETECTED   Haemophilus influenzae NOT DETECTED NOT DETECTED    Neisseria meningitidis NOT DETECTED NOT DETECTED   Pseudomonas aeruginosa NOT DETECTED NOT DETECTED   Stenotrophomonas maltophilia NOT DETECTED NOT DETECTED   Candida albicans NOT DETECTED NOT DETECTED   Candida auris NOT DETECTED NOT DETECTED   Candida glabrata NOT DETECTED NOT DETECTED   Candida krusei NOT DETECTED NOT DETECTED   Candida parapsilosis NOT DETECTED NOT DETECTED   Candida tropicalis NOT DETECTED NOT DETECTED   Cryptococcus neoformans/gattii NOT DETECTED NOT DETECTED   Methicillin resistance mecA/C DETECTED (A) NOT DETECTED   Marshun Duva A. Jeanella Craze, PharmD, BCPS, FNKF Clinical Pharmacist [place] Please utilize Amion for appropriate phone number to reach the unit pharmacist [facility])  01/28/2023  8:27 AM

## 2023-01-28 NOTE — Plan of Care (Signed)
  Problem: Education: Goal: Knowledge of General Education information will improve Description: Including pain rating scale, medication(s)/side effects and non-pharmacologic comfort measures Outcome: Progressing   Problem: Health Behavior/Discharge Planning: Goal: Ability to manage health-related needs will improve Outcome: Progressing   Problem: Clinical Measurements: Goal: Ability to maintain clinical measurements within normal limits will improve Outcome: Progressing Goal: Will remain free from infection Outcome: Progressing Goal: Diagnostic test results will improve Outcome: Progressing Goal: Respiratory complications will improve Outcome: Progressing Goal: Cardiovascular complication will be avoided Outcome: Progressing   Problem: Activity: Goal: Risk for activity intolerance will decrease Outcome: Progressing   Problem: Nutrition: Goal: Adequate nutrition will be maintained Outcome: Progressing   Problem: Coping: Goal: Level of anxiety will decrease Outcome: Progressing   Problem: Elimination: Goal: Will not experience complications related to bowel motility Outcome: Progressing Goal: Will not experience complications related to urinary retention Outcome: Progressing   Problem: Pain Management: Goal: General experience of comfort will improve Outcome: Progressing   Problem: Safety: Goal: Ability to remain free from injury will improve Outcome: Progressing   Problem: Skin Integrity: Goal: Risk for impaired skin integrity will decrease Outcome: Progressing   Problem: Education: Goal: Ability to describe self-care measures that may prevent or decrease complications (Diabetes Survival Skills Education) will improve Outcome: Progressing Goal: Individualized Educational Video(s) Outcome: Progressing   Problem: Coping: Goal: Ability to adjust to condition or change in health will improve Outcome: Progressing   Problem: Fluid Volume: Goal: Ability to  maintain a balanced intake and output will improve Outcome: Progressing   Problem: Health Behavior/Discharge Planning: Goal: Ability to identify and utilize available resources and services will improve Outcome: Progressing Goal: Ability to manage health-related needs will improve Outcome: Progressing   Problem: Metabolic: Goal: Ability to maintain appropriate glucose levels will improve Outcome: Progressing   Problem: Nutritional: Goal: Maintenance of adequate nutrition will improve Outcome: Progressing Goal: Progress toward achieving an optimal weight will improve Outcome: Progressing   Problem: Skin Integrity: Goal: Risk for impaired skin integrity will decrease Outcome: Progressing   Problem: Tissue Perfusion: Goal: Adequacy of tissue perfusion will improve Outcome: Progressing   Problem: Education: Goal: Knowledge of disease or condition will improve Outcome: Progressing Goal: Knowledge of secondary prevention will improve (MUST DOCUMENT ALL) Outcome: Progressing Goal: Knowledge of patient specific risk factors will improve Loraine Leriche N/A or DELETE if not current risk factor) Outcome: Progressing   Problem: Intracerebral Hemorrhage Tissue Perfusion: Goal: Complications of Intracerebral Hemorrhage will be minimized Outcome: Progressing   Problem: Coping: Goal: Will verbalize positive feelings about self Outcome: Progressing Goal: Will identify appropriate support needs Outcome: Progressing   Problem: Health Behavior/Discharge Planning: Goal: Ability to manage health-related needs will improve Outcome: Progressing Goal: Goals will be collaboratively established with patient/family Outcome: Progressing   Problem: Self-Care: Goal: Ability to participate in self-care as condition permits will improve Outcome: Progressing Goal: Verbalization of feelings and concerns over difficulty with self-care will improve Outcome: Progressing Goal: Ability to communicate needs  accurately will improve Outcome: Progressing   Problem: Nutrition: Goal: Risk of aspiration will decrease Outcome: Progressing Goal: Dietary intake will improve Outcome: Progressing

## 2023-01-28 NOTE — Progress Notes (Signed)
Pharmacy Antibiotic Note  Jocelyn [name] is a 89 y.o. female admitted on 01/14/2023 with acute right MCA infarct undergoing mechanical thrombectomy/revascularization. Patient PEG tube dislodged and replaced with noted abscess - s/p I&D with cultures sent 12/16 that are NGTD. Found to have enterobacter cloacae (R-fortaz, Zosyn) in 1/3 blood cultures. Patient was treated with cefepime for 7 days (ended on 12/23).   Patient now found to have lower lobe pneumonia and team is to start up antibiotics. Pharmacy has been consulted for meropenem dosing.  Patient now s/p I&D on 12/27  -WBC 15.3 > 14.2, Tmax 100.4 -Blood cultures : enterobacter cloacae (R-Zosyn, Elita Quick most likely AmpC-producer, no ESBL identified on BCID) -Cefepime course completed on 12/23  Plan: -Continue Meropenem 1g IV every 12 hours -Continue Azithromycin 500mg  IV every 24 hours -Monitor renal function -Monitor signs of clinical improvement and repeat cultures > if improves and cultures no growth would de-escalate to cefepime 2g IV every 12 hours -Follow up LOT   Height: 5\' 3"  (160 cm) Weight: 67.8 kg (149 lb 8 oz) IBW/kg (Calculated) : 52.4  Temp (24hrs), Avg:98.6 F (37 C), Min:98.2 F (36.8 C), Max:98.9 F (37.2 C)  Recent Labs  Lab 01/22/23 0518 01/23/23 0507 01/24/23 0556 01/25/23 0540 01/26/23 0602 01/27/23 0628 01/28/23 0434  WBC 15.7*   < > 14.2* 12.7* 14.5* 13.8* 13.0*  CREATININE 0.56  --  0.64  --  0.60 0.48 0.53   < > = values in this interval not displayed.    Estimated Creatinine Clearance: 44.1 mL/min (by C-G formula based on SCr of 0.53 mg/dL).    No Known Allergies  Antimicrobials this admission: Ceftriaxone 12/16 > 12/17  Cefepime 12/17 >> 12/23 Merrem 12/24 >> Azithromycin 12/24 >>   Microbiology results: 12/15 BCx: 1/3 Enterobacter cloacae  12/16 PEG drainage: NGTD 12/24 repeat Bcx: 12/24 Resp panel/COVID PCR:  12/14 MRSA PCR: negative   Kadyn Chovan A. Jeanella Craze, PharmD, BCPS,  FNKF Clinical Pharmacist [place] Please utilize Amion for appropriate phone number to reach the unit pharmacist [facility])  01/28/2023 7:26 AM

## 2023-01-28 NOTE — Progress Notes (Signed)
Progress Note   Patient: Jocelyn [name] NUU:[id number] DOB: Jul 13, 1931 DOA: 01/14/2023     14 DOS: the patient was seen and examined on 01/28/2023   Brief hospital course: 89 year old with a history of OA bilateral knees, HTN, GERD, chronic iron deficiency anemia, HLD, OSA on CPAP, pre-DM, and chronic atrial fibrillation on Eliquis who was discharged from the Stroke Service at [facility] 12/11 after management of an acute right MCA territory infarct related to her atrial fibrillation.  During that preceding admission she presented with left-sided weakness and was found to have a right A1 and M2 occlusion for which she underwent successful mechanical thrombectomy/revascularization.  She was ultimately discharged to a SNF.   She returned to the ER at [facility] from her rehab facility on 12/14 after developing a new left-sided facial droop and left arm numbness with weakness.  CT head at presentation revealed interval development of scattered subarachnoid hemorrhage in the right sylvian fissure.  Her Eliquis was reversed with Kcentra.  CTa revealed no LVO but atheromatous plaque with outpouchings extending posteriorly and inferiorly from both supraclinoid ICAs possibly representing small aneurysms.   Significant Events:  12/14 Admitted with [facility] 12/15 dislodged PEG tube 12/15 long term EEG w/o evidence of seizure activity  12/16 transferred out of the ICU, incision and drainage of left abdominal wall infection. 12/17 blood cultures positive for Enterobacter cloacae, cefepime started 12/19 additional I&D of abdominal wall performed 12/21 [facility] assumed care of patient  Assessment and Plan: Subarachnoid hemorrhage - possible cerebral aneurysm Noted at time of this readmission - occurred while on Eliquis therapy - imaging has suggested outpouchings extending posteriorly and inferiorly from both supraclinoid ICAs which could reflect small aneurysms - these were noted on CTa as well as MRa - yearly MRa is  recommended in follow-up by the Stroke Team. Per Neurology note, now not on antithrombotic   Recent CVA Admitted to the hospital 11/28-12/12/2022 for treatment of a right MCA territory infarct felt to be cardioembolic due to A-fib - an acute clot was noted at the right terminal ICA and the patient underwent intervention with subsequent revascularization   Abdominal wall infection General Surgery has been following with I&D performed 12/16 with Penrose drain placement and again 12/19 with placement of a second Penrose drain -wound care per General Surgery - IV antibiotic therapy resumed.  -pt taken to OR 12/27 for I/D   Enterobacter bacteremia Noted on 1 of 2 blood cultures drawn 12/15 - third-generation cephalosporin and Zosyn resistant - had earlier completed 7 full days of cefepime therapy -Repeat blood cx thus far neg   Dysphagia due to stroke PEG tube originally placed 12/9 during prior admission - this became dislodged 12/15 with the tract temporarily preserved using a Foley catheter which was subsequently exchanged for a replacement PEG tube in IR 12/16 -Re-evaluated by SLP 12/26 given concerns of aspiration. Cleared for dysphagia 1   Abnormal EEG Routine EEG 12/14 raised the question of cortical dysfunction arising from the right hemisphere but with no evidence of seizure activity - long-term EEG revealed no evidence of active seizure activity   Hypertensive emergency Blood pressure presently well-controlled   Chronic atrial fibrillation Eliquis discontinued due to [facility] - rate controlled   Transaminitis Statin on hold - LFTs steadily improving - follow intermittently and resume statin once normalized   HLD LDL 33 - statin discontinued 12/17 due to elevated LFTs  Aspiration Pneumonia with sepsis not present on admit -Pt febrile with leukocytosis, recently tachycardic -Concern for  new L sided PNA on CXR, reviewed. Suspicious for aspiration -continue broad spectrum abx, will  likely narrow soon -Appreciate SLP input. Pt has since been clear for dysphagia 1, however pt was observed closing eyes during trial, which concerned SLP for increased risk for aspiration -Agree with keeping HOB elevated >30 degrees -Cont with nystatin swish/swallow to cover oral candidiasis for now -Clinically improving  Hyperkalemia -K 5.3 today. Will give dose of lokelma      Subjective: No complaints this AM  Physical Exam: Vitals:   01/27/23 2348 01/28/23 0346 01/28/23 0744 01/28/23 1138  BP: (!) 146/67 (!) 118/57 (!) 120/55 (!) 118/50  Pulse: 96 86 97 80  Resp: 18 16 (!) 22 (!) 22  Temp: 98.9 F (37.2 C) 98.8 F (37.1 C) 98.4 F (36.9 C) 98.4 F (36.9 C)  TempSrc: Oral Oral Oral Oral  SpO2: 99% 98% 100% 100%  Weight:      Height:       General exam: Conversant, in no acute distress Respiratory system: normal chest rise, clear, no audible wheezing Cardiovascular system: regular rhythm, s1-s2 Gastrointestinal system: Nondistended, nontender, pos BS Central nervous system: No seizures, no tremors Extremities: No cyanosis, no joint deformities Skin: No rashes, no pallor Psychiatry: Affect normal // no auditory hallucinations   Data Reviewed:  Labs reviewed: Na 134, K 5.3, Cr 0.53  Family Communication: Pt in room, family not at bedside  Disposition: Status is: Inpatient Remains inpatient appropriate because: severity of illness  Planned Discharge Destination: Skilled nursing facility    Author: Rickey Barbara, MD 01/28/2023 1:56 PM  For on call review [email or web address].

## 2023-01-29 DIAGNOSIS — L02211 Cutaneous abscess of abdominal wall: Secondary | ICD-10-CM | POA: Diagnosis not present

## 2023-01-29 LAB — CULTURE, BLOOD (ROUTINE X 2): Culture: NO GROWTH

## 2023-01-29 LAB — COMPREHENSIVE METABOLIC PANEL
ALT: 55 U/L — ABNORMAL HIGH (ref 0–44)
AST: 43 U/L — ABNORMAL HIGH (ref 15–41)
Albumin: 2 g/dL — ABNORMAL LOW (ref 3.5–5.0)
Alkaline Phosphatase: 65 U/L (ref 38–126)
Anion gap: 5 (ref 5–15)
BUN: 20 mg/dL (ref 8–23)
CO2: 28 mmol/L (ref 22–32)
Calcium: 8.7 mg/dL — ABNORMAL LOW (ref 8.9–10.3)
Chloride: 103 mmol/L (ref 98–111)
Creatinine, Ser: 0.49 mg/dL (ref 0.44–1.00)
GFR, Estimated: >60 mL/min (ref >60)
Glucose, Bld: 125 mg/dL — ABNORMAL HIGH (ref 70–99)
Potassium: 4.7 mmol/L (ref 3.5–5.1)
Sodium: 136 mmol/L (ref 135–145)
Total Bilirubin: 0.5 mg/dL (ref <1.2)
Total Protein: 6.6 g/dL (ref 6.5–8.1)

## 2023-01-29 LAB — CBC
HCT: 25.4 % — ABNORMAL LOW (ref 36.0–46.0)
Hemoglobin: 8.1 g/dL — ABNORMAL LOW (ref 12.0–15.0)
MCH: 26.9 pg (ref 26.0–34.0)
MCHC: 31.9 g/dL (ref 30.0–36.0)
MCV: 84.4 fL (ref 80.0–100.0)
Platelets: 231 10*3/uL (ref 150–400)
RBC: 3.01 MIL/uL — ABNORMAL LOW (ref 3.87–5.11)
RDW: 16 % — ABNORMAL HIGH (ref 11.5–15.5)
WBC: 9.1 10*3/uL (ref 4.0–10.5)
nRBC: 0 % (ref 0.0–0.2)

## 2023-01-29 LAB — GLUCOSE, CAPILLARY
Glucose-Capillary: 133 mg/dL — ABNORMAL HIGH (ref 70–99)
Glucose-Capillary: 49 mg/dL — ABNORMAL LOW (ref 70–99)
Glucose-Capillary: 127 mg/dL — ABNORMAL HIGH (ref 70–99)
Glucose-Capillary: 139 mg/dL — ABNORMAL HIGH (ref 70–99)

## 2023-01-29 NOTE — Plan of Care (Signed)
°  Problem: Coping: Goal: Level of anxiety will decrease Outcome: Progressing   Problem: Elimination: Goal: Will not experience complications related to bowel motility Outcome: Progressing Goal: Will not experience complications related to urinary retention Outcome: Progressing   Problem: Skin Integrity: Goal: Risk for impaired skin integrity will decrease Outcome: Progressing   Problem: Nutritional: Goal: Maintenance of adequate nutrition will improve Outcome: Progressing   Problem: Skin Integrity: Goal: Risk for impaired skin integrity will decrease Outcome: Progressing   Problem: Education: Goal: Knowledge of disease or condition will improve Outcome: Progressing   Problem: Health Behavior/Discharge Planning: Goal: Goals will be collaboratively established with patient/family Outcome: Progressing   Problem: Self-Care: Goal: Ability to communicate needs accurately will improve Outcome: Progressing

## 2023-01-29 NOTE — Plan of Care (Signed)
  Problem: Education: Goal: Knowledge of General Education information will improve Description: Including pain rating scale, medication(s)/side effects and non-pharmacologic comfort measures Outcome: Progressing   Problem: Health Behavior/Discharge Planning: Goal: Ability to manage health-related needs will improve Outcome: Progressing   Problem: Clinical Measurements: Goal: Ability to maintain clinical measurements within normal limits will improve Outcome: Progressing Goal: Will remain free from infection Outcome: Progressing Goal: Diagnostic test results will improve Outcome: Progressing Goal: Respiratory complications will improve Outcome: Progressing Goal: Cardiovascular complication will be avoided Outcome: Progressing   Problem: Activity: Goal: Risk for activity intolerance will decrease Outcome: Progressing   Problem: Nutrition: Goal: Adequate nutrition will be maintained Outcome: Progressing   Problem: Coping: Goal: Level of anxiety will decrease Outcome: Progressing   Problem: Elimination: Goal: Will not experience complications related to bowel motility Outcome: Progressing Goal: Will not experience complications related to urinary retention Outcome: Progressing   Problem: Pain Management: Goal: General experience of comfort will improve Outcome: Progressing   Problem: Safety: Goal: Ability to remain free from injury will improve Outcome: Progressing   Problem: Skin Integrity: Goal: Risk for impaired skin integrity will decrease Outcome: Progressing   Problem: Education: Goal: Ability to describe self-care measures that may prevent or decrease complications (Diabetes Survival Skills Education) will improve Outcome: Progressing Goal: Individualized Educational Video(s) Outcome: Progressing   Problem: Coping: Goal: Ability to adjust to condition or change in health will improve Outcome: Progressing   Problem: Fluid Volume: Goal: Ability to  maintain a balanced intake and output will improve Outcome: Progressing   Problem: Health Behavior/Discharge Planning: Goal: Ability to identify and utilize available resources and services will improve Outcome: Progressing Goal: Ability to manage health-related needs will improve Outcome: Progressing   Problem: Metabolic: Goal: Ability to maintain appropriate glucose levels will improve Outcome: Progressing   Problem: Nutritional: Goal: Maintenance of adequate nutrition will improve Outcome: Progressing Goal: Progress toward achieving an optimal weight will improve Outcome: Progressing   Problem: Skin Integrity: Goal: Risk for impaired skin integrity will decrease Outcome: Progressing   Problem: Tissue Perfusion: Goal: Adequacy of tissue perfusion will improve Outcome: Progressing   Problem: Education: Goal: Knowledge of disease or condition will improve Outcome: Progressing Goal: Knowledge of secondary prevention will improve (MUST DOCUMENT ALL) Outcome: Progressing Goal: Knowledge of patient specific risk factors will improve Loraine Leriche N/A or DELETE if not current risk factor) Outcome: Progressing   Problem: Intracerebral Hemorrhage Tissue Perfusion: Goal: Complications of Intracerebral Hemorrhage will be minimized Outcome: Progressing   Problem: Coping: Goal: Will verbalize positive feelings about self Outcome: Progressing Goal: Will identify appropriate support needs Outcome: Progressing   Problem: Health Behavior/Discharge Planning: Goal: Ability to manage health-related needs will improve Outcome: Progressing Goal: Goals will be collaboratively established with patient/family Outcome: Progressing   Problem: Self-Care: Goal: Ability to participate in self-care as condition permits will improve Outcome: Progressing Goal: Verbalization of feelings and concerns over difficulty with self-care will improve Outcome: Progressing Goal: Ability to communicate needs  accurately will improve Outcome: Progressing   Problem: Nutrition: Goal: Risk of aspiration will decrease Outcome: Progressing Goal: Dietary intake will improve Outcome: Progressing

## 2023-01-29 NOTE — Progress Notes (Signed)
° ° °  2 Days Post-Op  Subjective: Afebrile, WBC normalized today  Objective: Vital signs in last 24 hours: Temp:  [97.4 F (36.3 C)-98.9 F (37.2 C)] 98.9 F (37.2 C) (12/29 0736) Pulse Rate:  [78-94] 89 (12/29 0736) Resp:  [18-24] 24 (12/29 0736) BP: (105-130)/(50-66) 130/66 (12/29 0736) SpO2:  [99 %-100 %] 100 % (12/29 0736) Last BM Date : 01/29/23  Intake/Output from previous day: 12/28 0701 - 12/29 0700 In: -  Out: 800 [Urine:800] Intake/Output this shift: No intake/output data recorded.  PE: General: resting comfortably, NAD Resp: normal work of breathing Abdomen: G tube in place. LUQ wound is clean and dry, no bleeding or purulent drainage, no surrounding cellulitis. Dressing changed.   Lab Results:  Recent Labs    01/28/23 0434 01/29/23 0450  WBC 13.0* 9.1  HGB 8.6* 8.1*  HCT 26.3* 25.4*  PLT 246 231   BMET Recent Labs    01/28/23 0434 01/29/23 0450  NA 134* 136  K 5.3* 4.7  CL 102 103  CO2 23 28  GLUCOSE 165* 125*  BUN 21 20  CREATININE 0.53 0.49  CALCIUM 8.6* 8.7*   PT/INR No results for input(s): "LABPROT", "INR" in the last 72 hours. CMP     Component Value Date/Time   NA 136 01/29/2023 0450   NA 143 12/07/2022 1535   NA 141 04/13/2011 1125   K 4.7 01/29/2023 0450   K 3.4 (L) 04/13/2011 1125   CL 103 01/29/2023 0450   CL 105 04/13/2011 1125   CO2 28 01/29/2023 0450   CO2 29 04/13/2011 1125   GLUCOSE 125 (H) 01/29/2023 0450   GLUCOSE 111 (H) 04/13/2011 1125   BUN 20 01/29/2023 0450   BUN 16 12/07/2022 1535   BUN 14 04/13/2011 1125   CREATININE 0.49 01/29/2023 0450   CREATININE 0.80 08/01/2018 1544   CALCIUM 8.7 (L) 01/29/2023 0450   CALCIUM 9.2 04/13/2011 1125   PROT 6.6 01/29/2023 0450   PROT 7.6 12/07/2022 1535   PROT 7.8 04/13/2011 1125   ALBUMIN 2.0 (L) 01/29/2023 0450   ALBUMIN 4.4 12/07/2022 1535   ALBUMIN 4.0 04/13/2011 1125   AST 43 (H) 01/29/2023 0450   AST 24 04/13/2011 1125   ALT 55 (H) 01/29/2023 0450   ALT 20  04/13/2011 1125   ALKPHOS 65 01/29/2023 0450   ALKPHOS 51 04/13/2011 1125   BILITOT 0.5 01/29/2023 0450   BILITOT 0.8 12/07/2022 1535   BILITOT 0.6 04/13/2011 1125   GFRNONAA >60 01/29/2023 0450   GFRNONAA 67 08/01/2018 1544   GFRAA 78 08/01/2018 1544   Lipase  No results found for: "LIPASE"    Assessment/Plan 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 Replaced by IR 12/16 after becoming dislodged, followed by bedside I&D x2. Now POD1 s/p operative debridement 12/27. - Ok to continue using G tube for feeds and meds - Wound care: saline WTD dressings to LUQ wound BID--surgery team performed dressing change for this AM - Intra-op cultures pending - Remainder of care per primary team    LOS: 15 days    Donata Duff, MD [facility] Surgery 01/29/23 8:26 AM

## 2023-01-29 NOTE — Progress Notes (Signed)
Progress Note   Patient: Jocelyn [name]:[id number] DOB: Dec 20, 1931 DOA: 01/14/2023     15 DOS: the patient was seen and examined on 01/29/2023   Brief hospital course: 89 year old with a history of OA bilateral knees, HTN, GERD, chronic iron deficiency anemia, HLD, OSA on CPAP, pre-DM, and chronic atrial fibrillation on Eliquis who was discharged from the Stroke Service at [facility] 12/11 after management of an acute right MCA territory infarct related to her atrial fibrillation.  During that preceding admission she presented with left-sided weakness and was found to have a right A1 and M2 occlusion for which she underwent successful mechanical thrombectomy/revascularization.  She was ultimately discharged to a SNF.   She returned to the ER at [facility] from her rehab facility on 12/14 after developing a new left-sided facial droop and left arm numbness with weakness.  CT head at presentation revealed interval development of scattered subarachnoid hemorrhage in the right sylvian fissure.  Her Eliquis was reversed with Kcentra.  CTa revealed no LVO but atheromatous plaque with outpouchings extending posteriorly and inferiorly from both supraclinoid ICAs possibly representing small aneurysms.   Significant Events:  12/14 Admitted with [facility] 12/15 dislodged PEG tube 12/15 long term EEG w/o evidence of seizure activity  12/16 transferred out of the ICU, incision and drainage of left abdominal wall infection. 12/17 blood cultures positive for Enterobacter cloacae, cefepime started 12/19 additional I&D of abdominal wall performed 12/21 [facility] assumed care of patient  Assessment and Plan: Subarachnoid hemorrhage - possible cerebral aneurysm Noted at time of this readmission - occurred while on Eliquis therapy - imaging has suggested outpouchings extending posteriorly and inferiorly from both supraclinoid ICAs which could reflect small aneurysms - these were noted on CTa as well as MRa - yearly MRa is  recommended in follow-up by the Stroke Team. Per Neurology note, now not on antithrombotic -Had remained stable thus far   Recent CVA Admitted to the hospital 11/28-12/12/2022 for treatment of a right MCA territory infarct felt to be cardioembolic due to A-fib - an acute clot was noted at the right terminal ICA and the patient underwent intervention with subsequent revascularization   Abdominal wall infection General Surgery has been following with I&D performed 12/16 with Penrose drain placement and again 12/19 with placement of a second Penrose drain -wound care per General Surgery - IV antibiotic therapy resumed.  -pt taken to OR 12/27 for I/D. WBC normalized. Awaiting aspirate culture results, thus far some gm pos in cluster with gm neg rods   Enterobacter bacteremia Noted on 1 of 2 blood cultures drawn 12/15 - third-generation cephalosporin and Zosyn resistant - had earlier completed 7 full days of cefepime therapy -Repeat blood cx thus far neg   Dysphagia due to stroke PEG tube originally placed 12/9 during prior admission - this became dislodged 12/15 with the tract temporarily preserved using a Foley catheter which was subsequently exchanged for a replacement PEG tube in IR 12/16 -Re-evaluated by SLP 12/26 given concerns of aspiration. Was cleared for dysphagia 1   Abnormal EEG Routine EEG 12/14 raised the question of cortical dysfunction arising from the right hemisphere but with no evidence of seizure activity - long-term EEG revealed no evidence of active seizure activity   Hypertensive emergency Blood pressure presently well-controlled   Chronic atrial fibrillation Eliquis discontinued due to [facility] - rate controlled   Transaminitis Statin on hold - LFTs steadily improving - follow intermittently and resume statin once normalized   HLD LDL 33 - statin  discontinued 12/17 due to elevated LFTs  Aspiration Pneumonia with sepsis not present on admit -Pt febrile with  leukocytosis, recently tachycardic -Concern for new L sided PNA on CXR, reviewed. Suspicious for aspiration -continue broad spectrum abx, will likely narrow soon -Appreciate SLP input. Pt has since been clear for dysphagia 1, however pt was observed closing eyes during trial, which concerned SLP for increased risk for aspiration -Agree with keeping HOB elevated >30 degrees -Cont with nystatin swish/swallow to cover oral candidiasis for now -Clinically improving on abx. Today is day 5 of resumption of abx, may consider d/c soon if abx is no longer needed for abd wound  Hyperkalemia -normalized      Subjective: No complaints this AM  Physical Exam: Vitals:   01/29/23 0416 01/29/23 0736 01/29/23 1202 01/29/23 1300  BP: 123/60 130/66 125/61   Pulse: 94 89 92   Resp: 18 (!) 24 (!) 24   Temp: 97.7 F (36.5 C) 98.9 F (37.2 C) 99.5 F (37.5 C) 98 F (36.7 C)  TempSrc: Oral Oral Oral Oral  SpO2: 100% 100% 100%   Weight:      Height:       General exam: Awake, laying in bed, in nad Respiratory system: Normal respiratory effort, no wheezing Cardiovascular system: regular rate, s1, s2 Gastrointestinal system: Soft, nondistended, positive BS Central nervous system: CN2-12 grossly intact, strength intact Extremities: Perfused, no clubbing Skin: Normal skin turgor, no notable skin lesions seen Psychiatry: Mood normal // no visual hallucinations   Data Reviewed:  Labs reviewed: Na 136, K 4.7, Cr 0.49, WBC 9.1, Hgb 8.1  Family Communication: Pt in room, family not at bedside  Disposition: Status is: Inpatient Remains inpatient appropriate because: severity of illness  Planned Discharge Destination: Skilled nursing facility    Author: Rickey Barbara, MD 01/29/2023 3:58 PM  For on call review [email or web address].

## 2023-01-30 DIAGNOSIS — I10 Essential (primary) hypertension: Secondary | ICD-10-CM | POA: Diagnosis not present

## 2023-01-30 DIAGNOSIS — I609 Nontraumatic subarachnoid hemorrhage, unspecified: Secondary | ICD-10-CM

## 2023-01-30 DIAGNOSIS — L02211 Cutaneous abscess of abdominal wall: Secondary | ICD-10-CM | POA: Diagnosis not present

## 2023-01-30 DIAGNOSIS — E440 Moderate protein-calorie malnutrition: Secondary | ICD-10-CM

## 2023-01-30 LAB — CBC
HCT: 26.5 % — ABNORMAL LOW (ref 36.0–46.0)
Hemoglobin: 8.5 g/dL — ABNORMAL LOW (ref 12.0–15.0)
MCH: 26.9 pg (ref 26.0–34.0)
MCHC: 32.1 g/dL (ref 30.0–36.0)
MCV: 83.9 fL (ref 80.0–100.0)
Platelets: 243 10*3/uL (ref 150–400)
RBC: 3.16 MIL/uL — ABNORMAL LOW (ref 3.87–5.11)
RDW: 15.9 % — ABNORMAL HIGH (ref 11.5–15.5)
WBC: 7.5 10*3/uL (ref 4.0–10.5)
nRBC: 0.3 % — ABNORMAL HIGH (ref 0.0–0.2)

## 2023-01-30 LAB — CULTURE, BLOOD (ROUTINE X 2): Culture  Setup Time: NO GROWTH

## 2023-01-30 LAB — COMPREHENSIVE METABOLIC PANEL
ALT: 41 U/L (ref 0–44)
AST: 30 U/L (ref 15–41)
Albumin: 2 g/dL — ABNORMAL LOW (ref 3.5–5.0)
Alkaline Phosphatase: 63 U/L (ref 38–126)
Anion gap: 8 (ref 5–15)
BUN: 19 mg/dL (ref 8–23)
CO2: 27 mmol/L (ref 22–32)
Calcium: 8.7 mg/dL — ABNORMAL LOW (ref 8.9–10.3)
Chloride: 101 mmol/L (ref 98–111)
Creatinine, Ser: 0.53 mg/dL (ref 0.44–1.00)
GFR, Estimated: >60 mL/min (ref >60)
Glucose, Bld: 118 mg/dL — ABNORMAL HIGH (ref 70–99)
Potassium: 5.1 mmol/L (ref 3.5–5.1)
Sodium: 136 mmol/L (ref 135–145)
Total Bilirubin: 0.4 mg/dL (ref <1.2)
Total Protein: 6.2 g/dL — ABNORMAL LOW (ref 6.5–8.1)

## 2023-01-30 LAB — GLUCOSE, CAPILLARY
Glucose-Capillary: 140 mg/dL — ABNORMAL HIGH (ref 70–99)
Glucose-Capillary: 141 mg/dL — ABNORMAL HIGH (ref 70–99)
Glucose-Capillary: 110 mg/dL — ABNORMAL HIGH (ref 70–99)
Glucose-Capillary: 121 mg/dL — ABNORMAL HIGH (ref 70–99)
Glucose-Capillary: 187 mg/dL — ABNORMAL HIGH (ref 70–99)

## 2023-01-30 NOTE — TOC Progression Note (Signed)
Transition of Care [facility]) - Progression Note    Patient Details  Name: Jocelyn [name] MRN: [id number] Date of Birth: [birth year]  Transition of Care [facility]) CM/SW Contact  Baldemar Lenis, [name] Phone Number: 01/30/2023, 3:24 PM  Clinical Narrative:   CSW updated by MD that patient should be medically stable for SNF soon. CSW coordinated with therapies assigned to see the patient today for updated notes, and requested CMA to initiate insurance authorization once updates were in the chart. CSW confirmed with [facility] that bed is still available. CSW to follow.    Expected Discharge Plan: Skilled Nursing Facility Barriers to Discharge: Continued Medical Work up, English as a second language teacher  Expected Discharge Plan and Services                                               Social Determinants of Health (SDOH) Interventions SDOH Screenings   Food Insecurity: Patient Unable To Answer (01/14/2023)  Housing: Patient Unable To Answer (01/14/2023)  Transportation Needs: Unknown (01/14/2023)  Utilities: Patient Unable To Answer (01/14/2023)  Alcohol Screen: Low Risk  (12/03/2018)  Depression (PHQ2-9): Low Risk  (12/06/2022)  Financial Resource Strain: Medium Risk (07/26/2018)  Physical Activity: Inactive (07/26/2018)  Social Connections: Moderately Isolated (07/26/2018)  Stress: No Stress Concern Present (07/26/2018)  Tobacco Use: Low Risk  (01/27/2023)    Readmission Risk Interventions     No data to display

## 2023-01-30 NOTE — Progress Notes (Signed)
   01/30/23 2000  BiPAP/CPAP/SIPAP  BiPAP/CPAP/SIPAP Pt Type Adult  BiPAP/CPAP/SIPAP Resmed  Reason BIPAP/CPAP not in use Non-compliant

## 2023-01-30 NOTE — Progress Notes (Signed)
Speech Language Pathology Treatment: Dysphagia;Cognitive-Linquistic  Patient Details Name: Jocelyn [name] MRN: [id number] DOB: 05/25/1931 Today's Date: 01/30/2023 Time: 4742-5956 SLP Time Calculation (min) (ACUTE ONLY): 16 min  Assessment / Plan / Recommendation Clinical Impression  Pt awake and alert, although requires cueing to spontaneously participate in conversation. Pt is oriented to self, although required Mod cueing to orient to time. She did not independently answer yes/no questions but was more responsive when topics related to her wants/needs. Acknowledge a degree of suspected hearing loss, although feel that their are various cognitive-linguistic factors additionally affecting her presentation. Pt's positioning continues to be sub-optimal with a significant L lateral lean. She accepted tspn presentations of the total contents of a Magic Cup before declining any further POs. She took cup sips of thin liquid x2 without overt s/s of aspiration. She continues to display mild oral holding at times. Given overall limited trials this date, hesitate to upgrade pt's diet at this time. Continue current diet with adherence to aspiration precautions as positioning allows. SLP will continue following to target dysphagia and cognitive-linguistic goals as able.    HPI HPI: 89 yo female presenting back to the hospital (transfer from ED at [facility]) from SNF on 12/14 with acute stroke symptoms and per EMS she had a new onset of left sided facial droop, numbness to left arm and significantly increased weakness of left arm. CTH showed showed interval development of scattered subarachnoid hemorrhage in the right sylvian fissure. Neurosurgery consulted and recommended Keppra 500mg . WBC elevated and trauma was consulted to evaluate PEG tube with CT abdomen pending. She was recently admitted 11/27-12/11 with L sided weakness and R MCA occlusion s/p IR revascularization with complicated arterial cannulation requiring  multiple sticks. Intubated 11/28-11/29. PMH includes: GERD, HTN, CHF, AFIB RVR on eliquis, HLD, OSA. MBSS 01/02/23 was limited due to oral holding, repeat MBS on 12/11 showed moderate oral dysphagia and suspected pharyngoesophageal dysphagia with prominent cricopharyngeaus and recommened PO diet of Dys 1 (puree), thin liquids; ST f/u for dysphagia tx/potential upgrade to diet.      SLP Plan  Continue with current plan of care      Recommendations for follow up therapy are one component of a multi-disciplinary discharge planning process, led by the attending physician.  Recommendations may be updated based on patient status, additional functional criteria and insurance authorization.    Recommendations  Diet recommendations: Dysphagia 1 (puree);Thin liquid Liquids provided via: Straw;Cup Medication Administration: Crushed with puree Supervision: Full supervision/cueing for compensatory strategies Compensations: Minimize environmental distractions;Slow rate;Small sips/bites;Other (Comment) Postural Changes and/or Swallow Maneuvers: Seated upright 90 degrees;Upright 30-60 min after meal                  Oral care BID   Frequent or constant Supervision/Assistance Dysphagia, oropharyngeal phase (R13.12);Cognitive communication deficit (L87.564)     Continue with current plan of care     Gwynneth Aliment, M.A., CF-SLP Speech Language Pathology, Acute Rehabilitation Services  Secure Chat preferred [phone]   01/30/2023, 11:47 AM

## 2023-01-30 NOTE — Progress Notes (Signed)
Occupational Therapy Treatment Patient Details Name: Jocelyn [name] MRN: [id number] DOB: 01-Oct-1931 Today's Date: 01/30/2023   History of present illness Pt is an 89 y.o. female who presented to the ED 12/13 due to LUE weakness, left facial droop and CTH showed development of scattered SAH. 12/15 dislodged PEG tube. 12/16 incision and drainage of left abdominal wall infection PEG replaced. 12/19 additional I&D of abdominal wall performed. Recent hospitalization 11/27-12/11 due to R MCA ischemic stroke for which she underwent IR thrombectomy and PEG placement.  She was discharged to [facility] 12/11. PMH: HTN, CHF, a fib with RVR on eliquis, HLD, OSA   OT comments  Pt making slow progress towards goals this session, needing mod A for seated grooming task. Pt heavy pushing to L with RUE able to sit EOB with and without use of Stedy frame x20 mins. Performed PROM to LUE and pt with trace grip in L hand. Pt presenting with impairments listed below, will follow acutely. Patient will benefit from continued inpatient follow up therapy, <3 hours/day to maximize safety/ind with ADL/functional mobility.       If plan is discharge home, recommend the following:  A lot of help with walking and/or transfers;Two people to help with walking and/or transfers;A lot of help with bathing/dressing/bathroom;Two people to help with bathing/dressing/bathroom;Assistance with cooking/housework;Assistance with feeding;Direct supervision/assist for medications management;Direct supervision/assist for financial management;Assist for transportation;Help with stairs or ramp for entrance   Equipment Recommendations  None recommended by OT    Recommendations for Other Services Speech consult    Precautions / Restrictions Precautions Precautions: Fall Precaution Comments: PEG tube, abd binder Restrictions Weight Bearing Restrictions Per Provider Order: No       Mobility Bed Mobility Overal bed mobility: Needs  Assistance Bed Mobility: Sit to Supine, Rolling, Sidelying to Sit Rolling: Total assist Sidelying to sit: HOB elevated, Total assist, +2 for physical assistance   Sit to supine: Max assist, +2 for physical assistance   General bed mobility comments: pt fearful of falling with all movement; rolled to rt and up from rt side with pt utilizing RUE to assist raise torso slightly; resistes leaning to rt to return to bed    Transfers                   General transfer comment: unable due to poor sitting balance and pushing     Balance Overall balance assessment: Needs assistance Sitting-balance support: Feet supported, Single extremity supported Sitting balance-Leahy Scale: Poor Sitting balance - Comments: continued pushing to her left (even with torso when RUE engaged in activities); placed stedy in front of pt with pt able to hold cross-bar in front of her with RUE for up to 10 seconds at a time; with stretching and positioing rt HOB elevated with pillow for her to prop on Rt elbow, she progressed to close stand-by for sitting balance for up to 30 sec Postural control: Left lateral lean                                 ADL either performed or assessed with clinical judgement   ADL Overall ADL's : Needs assistance/impaired     Grooming: Moderate assistance                                      Extremity/Trunk Assessment Upper Extremity Assessment  Upper Extremity Assessment: RUE deficits/detail;LUE deficits/detail RUE Deficits / Details: overall WFL, generally weak LUE Deficits / Details: noted 1/5 grasp in RUE, cannot shrug shoulders or hold against gravity   Lower Extremity Assessment Lower Extremity Assessment: Defer to PT evaluation        Vision   Additional Comments: will further assess   Perception Perception Perception: Impaired Preception Impairment Details: Inattention/Neglect Perception-Other Comments: L inattention   Praxis  Praxis Praxis: Impaired    Cognition Arousal: Alert Behavior During Therapy: Flat affect Overall Cognitive Status: No family/caregiver present to determine baseline cognitive functioning Area of Impairment: Attention, Following commands, Memory, Safety/judgement, Awareness, Problem solving, Orientation                 Orientation Level: Disoriented to, Time Current Attention Level: Focused Memory: Decreased recall of precautions, Decreased short-term memory Following Commands: Follows multi-step commands consistently Safety/Judgement: Decreased awareness of safety, Decreased awareness of deficits Awareness: Intellectual Problem Solving: Slow processing, Decreased initiation, Difficulty sequencing, Requires verbal cues, Requires tactile cues General Comments: pt a pusher and fearful of falling; internally distracted by fear with decr ability to attend to tasks for prolonged period        Exercises Other Exercises Other Exercises: PROM shoulder x5    Shoulder Instructions       General Comments VSS on supplemental O2    Pertinent Vitals/ Pain       Pain Assessment Pain Assessment: Faces Pain Score: 4  Faces Pain Scale: Hurts little more Pain Location: bilateral knees Pain Descriptors / Indicators: Grimacing, Discomfort Pain Intervention(s): Monitored during session, Limited activity within patient's tolerance, Repositioned  Home Living                                          Prior Functioning/Environment              Frequency  Min 1X/week        Progress Toward Goals  OT Goals(current goals can now be found in the care plan section)  Progress towards OT goals: Progressing toward goals  Acute Rehab OT Goals Patient Stated Goal: none stated OT Goal Formulation: Patient unable to participate in goal setting Time For Goal Achievement: 02/13/23 Potential to Achieve Goals: Fair ADL Goals Pt Will Perform Grooming: with min  assist;sitting;bed level Pt Will Perform Upper Body Dressing: with mod assist;sitting Pt Will Perform Lower Body Dressing: with mod assist;sitting/lateral leans;sit to/from stand Pt Will Transfer to Toilet: with mod assist;squat pivot transfer;stand pivot transfer;bedside commode  Plan      Co-evaluation    PT/OT/SLP Co-Evaluation/Treatment: Yes Reason for Co-Treatment: Necessary to address cognition/behavior during functional activity;Complexity of the patient's impairments (multi-system involvement);For patient/therapist safety;To address functional/ADL transfers PT goals addressed during session: Mobility/safety with mobility;Balance OT goals addressed during session: ADL's and self-care      AM-PAC OT "6 Clicks" Daily Activity     Outcome Measure   Help from another person eating meals?: Total Help from another person taking care of personal grooming?: A Lot Help from another person toileting, which includes using toliet, bedpan, or urinal?: Total Help from another person bathing (including washing, rinsing, drying)?: A Lot Help from another person to put on and taking off regular upper body clothing?: A Lot Help from another person to put on and taking off regular lower body clothing?: A Lot 6 Click Score: 10    End  of Session    OT Visit Diagnosis: Unsteadiness on feet (R26.81);Muscle weakness (generalized) (M62.81)   Activity Tolerance Patient tolerated treatment well;Patient limited by fatigue   Patient Left in bed;with call bell/phone within reach;with bed alarm set   Nurse Communication Mobility status        Time: 4782-9562 OT Time Calculation (min): 39 min  Charges: OT General Charges $OT Visit: 1 Visit OT Treatments $Therapeutic Activity: 8-22 mins  Carver Fila, OTD, OTR/L SecureChat Preferred Acute Rehab [phone]   Reed Eifert K Koonce 01/30/2023, 11:21 AM

## 2023-01-30 NOTE — Progress Notes (Signed)
Progress Note   Patient: Jocelyn [name]:[id number] DOB: [birth year] DOA: 01/14/2023     16 DOS: the patient was seen and examined on 01/30/2023   Brief hospital course: 89 year old with a history of OA bilateral knees, HTN, GERD, chronic iron deficiency anemia, HLD, OSA on CPAP, pre-DM, and chronic atrial fibrillation on Eliquis who was discharged from the Stroke Service at [facility] 12/11 after management of an acute right MCA territory infarct related to her atrial fibrillation.  During that preceding admission she presented with left-sided weakness and was found to have a right A1 and M2 occlusion for which she underwent successful mechanical thrombectomy/revascularization.  She was ultimately discharged to a SNF.   She returned to the ER at [facility] from her rehab facility on 12/14 after developing a new left-sided facial droop and left arm numbness with weakness.  CT head at presentation revealed interval development of scattered subarachnoid hemorrhage in the right sylvian fissure.  Her Eliquis was reversed with Kcentra.  CTa revealed no LVO but atheromatous plaque with outpouchings extending posteriorly and inferiorly from both supraclinoid ICAs possibly representing small aneurysms.   Significant Events:  12/14 Admitted with [facility] 12/15 dislodged PEG tube 12/15 long term EEG w/o evidence of seizure activity  12/16 transferred out of the ICU, incision and drainage of left abdominal wall infection. 12/17 blood cultures positive for Enterobacter cloacae, cefepime started 12/19 additional I&D of abdominal wall performed 12/21 [facility] assumed care of patient  Assessment and Plan: Subarachnoid hemorrhage - possible cerebral aneurysm Noted at time of this readmission - occurred while on Eliquis therapy - imaging has suggested outpouchings extending posteriorly and inferiorly from both supraclinoid ICAs which could reflect small aneurysms - these were noted on CTa as well as MRa - yearly MRa is  recommended in follow-up by the Stroke Team. Per Neurology note, now not on antithrombotic   Recent CVA Admitted to the hospital 11/28-12/12/2022 for treatment of a right MCA territory infarct felt to be cardioembolic due to A-fib - an acute clot was noted at the right terminal ICA and the patient underwent intervention with subsequent revascularization   Abdominal wall infection General Surgery has been following with I&D performed 12/16 with Penrose drain placement and again 12/19 with placement of a second Penrose drain -wound care per General Surgery - IV antibiotic therapy resumed.  -pt taken to OR 12/27 for I/D. WBC normalized. Awaiting aspirate culture results, thus far some gm pos in cluster with gm neg rods   Enterobacter bacteremia Noted on 1 of 2 blood cultures drawn 12/15 - third-generation cephalosporin and Zosyn resistant - had earlier completed 7 full days of cefepime therapy -Repeat blood cx thus far neg  Aspiration pneumonia Continue meropenem Stop Zithromax SLP following-dysphagia 1 diet. Aspirin patient precautions  Oral candidiasis Nystatin swish/swallow   Dysphagia due to stroke PEG tube originally placed 12/9 during prior admission - this became dislodged 12/15 with the tract temporarily preserved using a Foley catheter which was subsequently exchanged for a replacement PEG tube in IR 12/16 -Re-evaluated by SLP 12/26 given concerns of aspiration. Was cleared for dysphagia 1   Abnormal EEG Routine EEG 12/14 raised the question of cortical dysfunction arising from the right hemisphere but with no evidence of seizure activity - long-term EEG revealed no evidence of active seizure activity  HTN Blood pressure presently well-controlled-continue amlodipine.   Chronic atrial fibrillation Eliquis discontinued due to [facility] - rate controlled   Transaminitis Statin on hold - LFTs steadily improving - follow  intermittently and resume statin once normalized   HLD LDL 33  - statin discontinued 12/17 due to elevated LFTs  Subjective:  Notably in bed-no complaints.    Physical Exam: Vitals:   01/30/23 0414 01/30/23 0749 01/30/23 1000 01/30/23 1127  BP: 127/60 (!) 145/67 134/62 139/66  Pulse: 98 95  95  Resp: 18 17  18   Temp: 99 F (37.2 C) 99 F (37.2 C)  98.4 F (36.9 C)  TempSrc: Oral Oral  Oral  SpO2: 100% 100%  100%  Weight:      Height:       Gen Exam:Alert awake-not in any distress HEENT:atraumatic, normocephalic Chest: B/L clear to auscultation anteriorly CVS:S1S2 regular Abdomen:soft non tender, non distended Extremities:no edema Neurology: Generalized weakness-but appears to have slight left-sided weakness compared to the right. Skin: no rash   Family Communication: Pt in room, family not at bedside  Disposition: Status is: Inpatient Remains inpatient appropriate because: severity of illness  Planned Discharge Destination: Skilled nursing facility    Author: Jeoffrey Massed, MD 01/30/2023 12:51 PM  For on call review [email or web address].

## 2023-01-30 NOTE — Progress Notes (Signed)
Physical Therapy Treatment Patient Details Name: Jocelyn [name] MRN: [id number] DOB: 01-31-1932 Today's Date: 01/30/2023   History of Present Illness Pt is an 89 y.o. female who presented to the ED 12/13 due to LUE weakness, left facial droop and CTH showed development of scattered SAH. 12/15 dislodged PEG tube. 12/16 incision and drainage of left abdominal wall infection PEG replaced. 12/19 additional I&D of abdominal wall performed. Recent hospitalization 11/27-12/11 due to R MCA ischemic stroke for which she underwent IR thrombectomy and PEG placement.  She was discharged to [facility] 12/11. PMH: HTN, CHF, a fib with RVR on eliquis, HLD, OSA    PT Comments  Patient alert and agrees to work with therapy. She continues to be very fearful of falling (even with rolling), however with prolonged sitting EOB with stretching left side in rotation and head into rt lateral flexion and placing stedy in front of her for her to hold onto, she ultimately progressed to sitting without UE support and close guarding for up to 30 seconds. Best sitting done with HOB to her rt and partially elevated with pillow under rt forearm/elbow for support in midline. Attempts to lean to her rt onto forearm with bed flat met with strong resistance due to fear of falling.     If plan is discharge home, recommend the following: Two people to help with walking and/or transfers;Direct supervision/assist for medications management;Assist for transportation;Two people to help with bathing/dressing/bathroom;Assistance with cooking/housework;Assistance with feeding;Direct supervision/assist for financial management;Help with stairs or ramp for entrance;Supervision due to cognitive status   Can travel by private vehicle     No  Equipment Recommendations  None recommended by PT    Recommendations for Other Services       Precautions / Restrictions Precautions Precautions: Fall Precaution Comments: PEG tube, abd  binder Restrictions Weight Bearing Restrictions Per Provider Order: No     Mobility  Bed Mobility Overal bed mobility: Needs Assistance Bed Mobility: Sit to Supine, Rolling, Sidelying to Sit Rolling: Total assist Sidelying to sit: HOB elevated, Total assist, +2 for physical assistance   Sit to supine: Max assist, +2 for physical assistance   General bed mobility comments: pt fearful of falling with all movement; rolled to rt and up from rt side with pt utilizing RUE to assist raise torso slightly; resistes leaning to rt to return to bed    Transfers                   General transfer comment: unable due to poor sitting balance and pushing    Ambulation/Gait                   Stairs             Wheelchair Mobility     Tilt Bed    Modified Rankin (Stroke Patients Only) Modified Rankin (Stroke Patients Only) Pre-Morbid Rankin Score: Severe disability Modified Rankin: Severe disability     Balance Overall balance assessment: Needs assistance Sitting-balance support: Feet supported, Single extremity supported Sitting balance-Leahy Scale: Poor Sitting balance - Comments: continued pushing to her left (even with torso when RUE engaged in activities); placed stedy in front of pt with pt able to hold cross-bar in front of her with RUE for up to 10 seconds at a time; with stretching and positioing rt HOB elevated with pillow for her to prop on Rt elbow, she progressed to close stand-by for sitting balance for up to 30 sec Postural control: Left lateral lean  Cognition Arousal: Alert Behavior During Therapy: Flat affect Overall Cognitive Status: No family/caregiver present to determine baseline cognitive functioning Area of Impairment: Attention, Following commands, Memory, Safety/judgement, Awareness, Problem solving, Orientation                 Orientation Level: Disoriented to, Time Current  Attention Level: Focused Memory: Decreased recall of precautions, Decreased short-term memory Following Commands: Follows multi-step commands consistently Safety/Judgement: Decreased awareness of safety, Decreased awareness of deficits Awareness: Intellectual Problem Solving: Slow processing, Decreased initiation, Difficulty sequencing, Requires verbal cues, Requires tactile cues General Comments: pt a pusher and fearful of falling; internally distracted by fear with decr ability to attend to tasks for prolonged period        Exercises      General Comments        Pertinent Vitals/Pain Pain Assessment Pain Assessment: Faces Faces Pain Scale: Hurts little more Pain Location: bilateral knees Pain Descriptors / Indicators: Grimacing, Discomfort Pain Intervention(s): Monitored during session, Limited activity within patient's tolerance, Repositioned    Home Living                          Prior Function            PT Goals (current goals can now be found in the care plan section) Acute Rehab PT Goals Patient Stated Goal: not stated PT Goal Formulation: With patient Time For Goal Achievement: 02/12/23 Potential to Achieve Goals: Fair Progress towards PT goals: Goals updated    Frequency    Min 1X/week      PT Plan      Co-evaluation PT/OT/SLP Co-Evaluation/Treatment: Yes Reason for Co-Treatment: Necessary to address cognition/behavior during functional activity;Complexity of the patient's impairments (multi-system involvement);For patient/therapist safety;To address functional/ADL transfers PT goals addressed during session: Mobility/safety with mobility;Balance        AM-PAC PT "6 Clicks" Mobility   Outcome Measure  Help needed turning from your back to your side while in a flat bed without using bedrails?: Total Help needed moving from lying on your back to sitting on the side of a flat bed without using bedrails?: Total Help needed moving to and  from a bed to a chair (including a wheelchair)?: Total Help needed standing up from a chair using your arms (e.g., wheelchair or bedside chair)?: Total Help needed to walk in hospital room?: Total Help needed climbing 3-5 steps with a railing? : Total 6 Click Score: 6    End of Session   Activity Tolerance: Patient tolerated treatment well Patient left: with call bell/phone within reach;in bed (HOB >30 deg) Nurse Communication: Mobility status PT Visit Diagnosis: Other abnormalities of gait and mobility (R26.89);Hemiplegia and hemiparesis;Other symptoms and signs involving the nervous system (R29.898) Hemiplegia - Right/Left: Left Hemiplegia - dominant/non-dominant: Non-dominant Hemiplegia - caused by: Nontraumatic intracerebral hemorrhage     Time: 0918-0957 PT Time Calculation (min) (ACUTE ONLY): 39 min  Charges:    $Neuromuscular Re-education: 23-37 mins PT General Charges $$ ACUTE PT VISIT: 1 Visit                      Jerolyn Center, PT Acute Rehabilitation Services  Office [phone]    Zena Amos 01/30/2023, 10:25 AM

## 2023-01-31 DIAGNOSIS — I6310 Cerebral infarction due to embolism of unspecified precerebral artery: Secondary | ICD-10-CM

## 2023-01-31 DIAGNOSIS — L8990 Pressure ulcer of unspecified site, unspecified stage: Secondary | ICD-10-CM | POA: Insufficient documentation

## 2023-01-31 DIAGNOSIS — J690 Pneumonitis due to inhalation of food and vomit: Secondary | ICD-10-CM

## 2023-01-31 DIAGNOSIS — R1310 Dysphagia, unspecified: Secondary | ICD-10-CM

## 2023-01-31 DIAGNOSIS — L02211 Cutaneous abscess of abdominal wall: Principal | ICD-10-CM

## 2023-01-31 DIAGNOSIS — R7401 Elevation of levels of liver transaminase levels: Secondary | ICD-10-CM

## 2023-01-31 DIAGNOSIS — I482 Chronic atrial fibrillation, unspecified: Secondary | ICD-10-CM

## 2023-01-31 DIAGNOSIS — I619 Nontraumatic intracerebral hemorrhage, unspecified: Secondary | ICD-10-CM | POA: Diagnosis not present

## 2023-01-31 DIAGNOSIS — R7881 Bacteremia: Secondary | ICD-10-CM | POA: Diagnosis not present

## 2023-01-31 DIAGNOSIS — B9689 Other specified bacterial agents as the cause of diseases classified elsewhere: Secondary | ICD-10-CM

## 2023-01-31 DIAGNOSIS — B370 Candidal stomatitis: Secondary | ICD-10-CM

## 2023-01-31 DIAGNOSIS — E785 Hyperlipidemia, unspecified: Secondary | ICD-10-CM

## 2023-01-31 LAB — CBC
HCT: 30.2 % — ABNORMAL LOW (ref 36.0–46.0)
Hemoglobin: 9.7 g/dL — ABNORMAL LOW (ref 12.0–15.0)
MCH: 27.1 pg (ref 26.0–34.0)
MCHC: 32.1 g/dL (ref 30.0–36.0)
MCV: 84.4 fL (ref 80.0–100.0)
Platelets: 268 10*3/uL (ref 150–400)
RBC: 3.58 MIL/uL — ABNORMAL LOW (ref 3.87–5.11)
RDW: 16.2 % — ABNORMAL HIGH (ref 11.5–15.5)
WBC: 8.8 10*3/uL (ref 4.0–10.5)
nRBC: 0 % (ref 0.0–0.2)

## 2023-01-31 LAB — COMPREHENSIVE METABOLIC PANEL
ALT: 40 U/L (ref 0–44)
AST: 29 U/L (ref 15–41)
Albumin: 2.1 g/dL — ABNORMAL LOW (ref 3.5–5.0)
Alkaline Phosphatase: 63 U/L (ref 38–126)
Anion gap: 6 (ref 5–15)
BUN: 16 mg/dL (ref 8–23)
CO2: 29 mmol/L (ref 22–32)
Calcium: 9 mg/dL (ref 8.9–10.3)
Chloride: 102 mmol/L (ref 98–111)
Creatinine, Ser: 0.53 mg/dL (ref 0.44–1.00)
GFR, Estimated: >60 mL/min (ref >60)
Glucose, Bld: 127 mg/dL — ABNORMAL HIGH (ref 70–99)
Potassium: 4.7 mmol/L (ref 3.5–5.1)
Sodium: 137 mmol/L (ref 135–145)
Total Bilirubin: 0.4 mg/dL (ref 0.0–1.2)
Total Protein: 6.8 g/dL (ref 6.5–8.1)

## 2023-01-31 LAB — GLUCOSE, CAPILLARY
Glucose-Capillary: 142 mg/dL — ABNORMAL HIGH (ref 70–99)
Glucose-Capillary: 116 mg/dL — ABNORMAL HIGH (ref 70–99)
Glucose-Capillary: 123 mg/dL — ABNORMAL HIGH (ref 70–99)

## 2023-01-31 NOTE — Progress Notes (Signed)
 Speech Language Pathology Treatment: Dysphagia  Patient Details Name: Jocelyn [name] MRN: [id number] DOB: 1931-07-08 Today's Date: 01/31/2023 Time: 8692-8681 SLP Time Calculation (min) (ACUTE ONLY): 11 min  Assessment / Plan / Recommendation Clinical Impression  Pt seen for skilled SLP session to assess readiness for diet advancement. Upgraded solids resulted in oral residue that pt independently used a liquid wash to clear. She demonstrates prompt oral transit and adequate mashing/mastication of solids. No overt coughing noted throughout all trials. Recommend upgrading diet to Dys 2 solids and continuing thin liquids. She verbalized excitement regarding upgrade. Will continue following.    HPI HPI: 89 yo female presenting back to the hospital (transfer from ED at [facility]) from SNF on 12/14 with acute stroke symptoms and per EMS she had a new onset of left sided facial droop, numbness to left arm and significantly increased weakness of left arm. CTH showed showed interval development of scattered subarachnoid hemorrhage in the right sylvian fissure. Neurosurgery consulted and recommended Keppra  500mg . WBC elevated and trauma was consulted to evaluate PEG tube with CT abdomen pending. She was recently admitted 11/27-12/11 with L sided weakness and R MCA occlusion s/p IR revascularization with complicated arterial cannulation requiring multiple sticks. Intubated 11/28-11/29. PMH includes: GERD, HTN, CHF, AFIB RVR on eliquis , HLD, OSA. MBSS 01/02/23 was limited due to oral holding, repeat MBS on 12/11 showed moderate oral dysphagia and suspected pharyngoesophageal dysphagia with prominent cricopharyngeaus and recommened PO diet of Dys 1 (puree), thin liquids; ST f/u for dysphagia tx/potential upgrade to diet.      SLP Plan  Continue with current plan of care      Recommendations for follow up therapy are one component of a multi-disciplinary discharge planning process, led by the attending physician.   Recommendations may be updated based on patient status, additional functional criteria and insurance authorization.    Recommendations  Diet recommendations: Dysphagia 2 (fine chop);Thin liquid Liquids provided via: Straw;Cup Medication Administration: Crushed with puree Supervision: Full supervision/cueing for compensatory strategies Compensations: Minimize environmental distractions;Slow rate;Small sips/bites;Other (Comment) Postural Changes and/or Swallow Maneuvers: Seated upright 90 degrees;Upright 30-60 min after meal                  Oral care BID   Frequent or constant Supervision/Assistance Dysphagia, oropharyngeal phase (R13.12);Cognitive communication deficit (M58.158)     Continue with current plan of care     Damien Blumenthal, M.A., CF-SLP Speech Language Pathology, Acute Rehabilitation Services  Secure Chat preferred [phone]   01/31/2023, 1:22 PM

## 2023-01-31 NOTE — Progress Notes (Signed)
 4 Days Post-Op    Subjective: Reports she is feeling better ROS negative except as listed above. Objective: Vital signs in last 24 hours: Temp:  [98.2 F (36.8 C)-99.3 F (37.4 C)] 99.3 F (37.4 C) (12/31 0802) Pulse Rate:  [92-101] 96 (12/31 0802) Resp:  [16-18] 18 (12/31 0802) BP: (134-149)/(62-78) 141/72 (12/31 0802) SpO2:  [98 %-100 %] 100 % (12/31 0802) Last BM Date : 01/30/23  Intake/Output from previous day: 12/30 0701 - 12/31 0700 In: -  Out: 1900 [Urine:1900] Intake/Output this shift: No intake/output data recorded.  GI: G tube in place. I&D site fairly clean with WTD  Lab Results: CBC  Recent Labs    01/30/23 0626 01/31/23 0536  WBC 7.5 8.8  HGB 8.5* 9.7*  HCT 26.5* 30.2*  PLT 243 268   BMET Recent Labs    01/30/23 0626 01/31/23 0536  NA 136 137  K 5.1 4.7  CL 101 102  CO2 27 29  GLUCOSE 118* 127*  BUN 19 16  CREATININE 0.53 0.53  CALCIUM  8.7* 9.0   PT/INR No results for input(s): LABPROT, INR in the last 72 hours. ABG No results for input(s): PHART, HCO3 in the last 72 hours.  Invalid input(s): PCO2, PO2  Studies/Results: No results found.  Anti-infectives: Anti-infectives (From admission, onward)    Start     Dose/Rate Route Frequency Ordered Stop   01/25/23 0800  ciprofloxacin  (CIPRO ) tablet 500 mg  Status:  Discontinued        500 mg Oral 2 times daily 01/24/23 0850 01/24/23 2219   01/25/23 0000  azithromycin  (ZITHROMAX ) 500 mg in sodium chloride  0.9 % 250 mL IVPB  Status:  Discontinued        500 mg 250 mL/hr over 60 Minutes Intravenous Every 24 hours 01/24/23 2219 01/30/23 1255   01/24/23 2315  meropenem  (MERREM ) 1 g in sodium chloride  0.9 % 100 mL IVPB        1 g 200 mL/hr over 30 Minutes Intravenous Every 12 hours 01/24/23 2218     01/17/23 1130  ceFEPIme  (MAXIPIME ) 2 g in sodium chloride  0.9 % 100 mL IVPB        2 g 200 mL/hr over 30 Minutes Intravenous Every 12 hours 01/17/23 1038 01/24/23 0355   01/16/23 0945   cefTRIAXone  (ROCEPHIN ) 2 g in sodium chloride  0.9 % 100 mL IVPB  Status:  Discontinued        2 g 200 mL/hr over 30 Minutes Intravenous Every 24 hours 01/16/23 0931 01/17/23 1038       Assessment/Plan: 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 Replaced by IR 12/16 after becoming dislodged, followed by bedside I&D x2. Now POD1 s/p operative debridement 12/27. - Ok to continue using G tube for feeds and meds - Wound care: saline WTD dressings to LUQ wound BID. Improving. - Intra-op cultures show strep and enterobacter and she is on Merrem  for this. - Remainder of care per primary team  LOS: 17 days    Dann Hummer, MD, MPH, FACS Trauma & General Surgery Use [email or web address] to contact on call provider  12/31/2024Patient ID: Jocelyn [name], female   DOB: Jun 20, 1931, 89 y.o.   MRN: [id number]

## 2023-01-31 NOTE — TOC Progression Note (Signed)
 Transition of Care [facility]) - Progression Note    Patient Details  Name: Jocelyn [name] MRN: [id number] Date of Birth: February 18, 1931  [facility] CM/SW Contact  [name], [name] Phone Number: 01/31/2023, 3:22 PM  Clinical Narrative:   Patient shara is approved for [facility] but not medically stable for discharge today. CSW spoke with Admissions at [facility] and they can accept patient tomorrow. CSW to follow.    Expected Discharge Plan: Skilled Nursing Facility Barriers to Discharge: Continued Medical Work up, English As A Second Language Teacher  Expected Discharge Plan and Services                                               Social Determinants of Health (SDOH) Interventions SDOH Screenings   Food Insecurity: Patient Unable To Answer (01/14/2023)  Housing: Patient Unable To Answer (01/14/2023)  Transportation Needs: Unknown (01/14/2023)  Utilities: Patient Unable To Answer (01/14/2023)  Alcohol Screen: Low Risk  (12/03/2018)  Depression (PHQ2-9): Low Risk  (12/06/2022)  Financial Resource Strain: Medium Risk (07/26/2018)  Physical Activity: Inactive (07/26/2018)  Social Connections: Moderately Isolated (07/26/2018)  Stress: No Stress Concern Present (07/26/2018)  Tobacco Use: Low Risk  (01/27/2023)    Readmission Risk Interventions     No data to display

## 2023-01-31 NOTE — Progress Notes (Signed)
 Pharmacy Antibiotic Note  Jocelyn [name] is a 89 y.o. female admitted on 01/14/2023 with acute right MCA infarct undergoing mechanical thrombectomy/revascularization. Patient PEG tube dislodged and replaced with noted abscess - s/p I&D with cultures sent 12/16 that are NGTD. Found to have enterobacter cloacae (R-fortaz, Zosyn) in 1/3 blood cultures. Patient was treated with cefepime  for 7 days (ended on 12/23).   Patient now found to have lower lobe pneumonia and team is to start up antibiotics. Pharmacy has been consulted for meropenem  dosing.  -Blood cultures : enterobacter cloacae (R-Zosyn, Melida most likely AmpC-producer, no ESBL identified on BCID) -Cefepime  course completed on 12/23  Plan: -Continue Meropenem  1g IV every 12 hours -Monitor renal function -Monitor signs of clinical improvement and repeat cultures > if improves and cultures no growth could de-escalate to cefepime  2g IV every 12 hours -Follow up LOT   Height: 5' 3 (160 cm) Weight: 67.8 kg (149 lb 8 oz) IBW/kg (Calculated) : 52.4  Temp (24hrs), Avg:98.6 F (37 C), Min:98.2 F (36.8 C), Max:99.3 F (37.4 C)  Recent Labs  Lab 01/27/23 0628 01/28/23 0434 01/29/23 0450 01/30/23 0626 01/31/23 0536  WBC 13.8* 13.0* 9.1 7.5 8.8  CREATININE 0.48 0.53 0.49 0.53 0.53    Estimated Creatinine Clearance: 44.1 mL/min (by C-G formula based on SCr of 0.53 mg/dL).    No Known Allergies  Antimicrobials this admission: Ceftriaxone  12/16 > 12/17  Cefepime  12/17 >> 12/23 Merrem  12/24 >> Azithromycin  12/24 >>12/30   Microbiology results: 12/15 BCx: 1/3 Enterobacter cloacae  12/16 PEG drainage: NGTD 12/24 repeat Bcx: 12/24 Resp panel/COVID PCR:  12/14 MRSA PCR: negative  Thank you Olam Monte, PharmD  01/31/2023 8:41 AM

## 2023-01-31 NOTE — Plan of Care (Signed)

## 2023-01-31 NOTE — Plan of Care (Signed)
 Conts on Tfw/ PEG, BG checks, plan for SNF   Problem: Education: Goal: Knowledge of General Education information will improve Description: Including pain rating scale, medication(s)/side effects and non-pharmacologic comfort measures Outcome: Not Met (add Reason)   Problem: Activity: Goal: Risk for activity intolerance will decrease Outcome: Not Met (add Reason)   Problem: Nutrition: Goal: Adequate nutrition will be maintained Outcome: Not Met (add Reason)   Problem: Safety: Goal: Ability to remain free from injury will improve Outcome: Not Met (add Reason)   Problem: Intracerebral Hemorrhage Tissue Perfusion: Goal: Complications of Intracerebral Hemorrhage will be minimized Outcome: Not Met (add Reason)   Problem: Health Behavior/Discharge Planning: Goal: Ability to manage health-related needs will improve Outcome: Not Met (add Reason) Goal: Goals will be collaboratively established with patient/family Outcome: Not Met (add Reason)   Problem: Self-Care: Goal: Ability to participate in self-care as condition permits will improve Outcome: Not Met (add Reason) Goal: Verbalization of feelings and concerns over difficulty with self-care will improve Outcome: Not Met (add Reason) Goal: Ability to communicate needs accurately will improve Outcome: Not Met (add Reason)

## 2023-01-31 NOTE — Plan of Care (Signed)
  Problem: Education: Goal: Knowledge of General Education information will improve Description: Including pain rating scale, medication(s)/side effects and non-pharmacologic comfort measures 01/31/2023 0306 by Delores Kirsch, RN Outcome: Progressing 01/31/2023 0305 by Delores Kirsch, RN Outcome: Progressing   Problem: Health Behavior/Discharge Planning: Goal: Ability to manage health-related needs will improve 01/31/2023 0306 by Delores Kirsch, RN Outcome: Progressing 01/31/2023 0305 by Delores Kirsch, RN Outcome: Progressing   Problem: Clinical Measurements: Goal: Ability to maintain clinical measurements within normal limits will improve 01/31/2023 0306 by Delores Kirsch, RN Outcome: Progressing 01/31/2023 0305 by Delores Kirsch, RN Outcome: Progressing Goal: Will remain free from infection Outcome: Progressing   Problem: Coping: Goal: Ability to adjust to condition or change in health will improve Outcome: Progressing   Problem: Education: Goal: Knowledge of disease or condition will improve Outcome: Progressing Goal: Knowledge of secondary prevention will improve (MUST DOCUMENT ALL) Outcome: Progressing Goal: Knowledge of patient specific risk factors will improve Alonso N/A or DELETE if not current risk factor) Outcome: Progressing   Problem: Intracerebral Hemorrhage Tissue Perfusion: Goal: Complications of Intracerebral Hemorrhage will be minimized Outcome: Progressing   Problem: Coping: Goal: Will verbalize positive feelings about self Outcome: Progressing Goal: Will identify appropriate support needs Outcome: Progressing   Problem: Health Behavior/Discharge Planning: Goal: Ability to manage health-related needs will improve Outcome: Progressing Goal: Goals will be collaboratively established with patient/family Outcome: Progressing   Problem: Self-Care: Goal: Ability to participate in self-care as condition permits will improve Outcome: Progressing Goal:  Verbalization of feelings and concerns over difficulty with self-care will improve Outcome: Progressing Goal: Ability to communicate needs accurately will improve Outcome: Progressing   Problem: Nutrition: Goal: Risk of aspiration will decrease Outcome: Progressing Goal: Dietary intake will improve Outcome: Progressing

## 2023-01-31 NOTE — Progress Notes (Signed)
 PROGRESS NOTE    Jocelyn [name]  FMW:[id number] DOB: August 13, 1931 DOA: 01/14/2023 PCP: [name], PA-C    No chief complaint on file.   Brief Narrative:  89 year old with a history of OA bilateral knees, HTN, GERD, chronic iron deficiency anemia, HLD, OSA on CPAP, pre-DM, and chronic atrial fibrillation on Eliquis  who was discharged from the Stroke Service at [facility] 12/11 after management of an acute right MCA territory infarct related to her atrial fibrillation.  During that preceding admission she presented with left-sided weakness and was found to have a right A1 and M2 occlusion for which she underwent successful mechanical thrombectomy/revascularization.  She was ultimately discharged to a SNF.   She returned to the ER at [facility] from her rehab facility on 12/14 after developing a new left-sided facial droop and left arm numbness with weakness.  CT head at presentation revealed interval development of scattered subarachnoid hemorrhage in the right sylvian fissure.  Her Eliquis  was reversed with Kcentra .  CTa revealed no LVO but atheromatous plaque with outpouchings extending posteriorly and inferiorly from both supraclinoid ICAs possibly representing small aneurysms.   Significant Events:  12/14 Admitted with [facility] 12/15 dislodged PEG tube 12/15 long term EEG w/o evidence of seizure activity  12/16 transferred out of the ICU, incision and drainage of left abdominal wall infection. 12/17 blood cultures positive for Enterobacter cloacae, cefepime  started 12/19 additional I&D of abdominal wall performed 12/21 [facility] assumed care of patient     Assessment & Plan:   Principal Problem:   ICH (intracerebral hemorrhage) (HCC) Active Problems:   Hyperlipidemia   Malnutrition of moderate degree   Abdominal wall abscess   Bacteremia due to Enterobacter species   Aspiration pneumonia (HCC)   Dysphagia   Oral candidiasis   Transaminitis   Chronic a-fib (HCC)   Pressure injury of  skin  Subarachnoid hemorrhage - possible cerebral aneurysm Noted at time of this readmission - occurred while on Eliquis  therapy - imaging has suggested outpouchings extending posteriorly and inferiorly from both supraclinoid ICAs which could reflect small aneurysms - these were noted on CTa as well as MRa - yearly MRa is recommended in follow-up by the Stroke Team. Per Neurology note, now not on antithrombotic   Recent CVA Admitted to the hospital 11/28-12/12/2022 for treatment of a right MCA territory infarct felt to be cardioembolic due to A-fib - an acute clot was noted at the right terminal ICA and the patient underwent intervention with subsequent revascularization   Abdominal wall infection General Surgery has been following with I&D performed 12/16 with Penrose drain placement and again 12/19 with placement of a second Penrose drain -wound care per General Surgery - IV antibiotic therapy resumed.  -pt taken to OR 12/27 for I/D. WBC normalized. Awaiting aspirate culture results, thus far Enterobacter cloacae and Streptococcus aginosis. -Per general surgery.   Enterobacter bacteremia Noted on 1 of 2 blood cultures drawn 12/15 - third-generation cephalosporin and Zosyn resistant - had earlier completed 7 full days of cefepime  therapy -Repeat blood cx thus far neg   Aspiration pneumonia Continue meropenem  Azithromycin  discontinued.  SLP following-dysphagia 1 diet upgraded to dysphagia 2 diet.. Aspiration precautions   Oral candidiasis Continue nystatin  swish/swallow   Dysphagia due to stroke PEG tube originally placed 12/9 during prior admission - this became dislodged 12/15 with the tract temporarily preserved using a Foley catheter which was subsequently exchanged for a replacement PEG tube in IR 12/16 -Re-evaluated by SLP 12/26 given concerns of aspiration. Was cleared for  dysphagia 1. -Continue current tube feeds. -Currently started on dysphagia 2 diet today.   Abnormal  EEG Routine EEG 12/14 raised the question of cortical dysfunction arising from the right hemisphere but with no evidence of seizure activity - long-term EEG revealed no evidence of active seizure activity. -Patient was seen in consultation by teleneurology.   HTN -Continue Norvasc  . -Continue to hold Avapro  and HCTZ.     Chronic atrial fibrillation -Rate controlled.   -Eliquis  discontinued due to [facility] - rate controlled   Transaminitis Statin on hold due to transaminitis which is improving and trending down. -Repeat LFTs in the next 24 hours and if continued improvement may consider resumption of statin in the next 24 to 48 hours.   HLD LDL 33 - statin discontinued 12/17 due to elevated LFTs -Outpatient follow-up.  Pressure injury, Pressure Injury 01/21/23 Sacrum Stage 2 -  Partial thickness loss of dermis presenting as a shallow open injury with a red, pink wound bed without slough. red open sore (Active)  01/21/23 0800  Location: Sacrum  Location Orientation:   Staging: Stage 2 -  Partial thickness loss of dermis presenting as a shallow open injury with a red, pink wound bed without slough.  Wound Description (Comments): red open sore  Present on Admission: No     Pressure Injury 01/21/23 Heel Left;Posterior Deep Tissue Pressure Injury - Purple or maroon localized area of discolored intact skin or blood-filled blister due to damage of underlying soft tissue from pressure and/or shear. purplish area (Active)  01/21/23 2000  Location: Heel  Location Orientation: Left;Posterior  Staging: Deep Tissue Pressure Injury - Purple or maroon localized area of discolored intact skin or blood-filled blister due to damage of underlying soft tissue from pressure and/or shear.  Wound Description (Comments): purplish area  Present on Admission: -- (unknown, no documentation found)         DVT prophylaxis: Heparin  Code Status: Full Family Communication: Updated patient.  No family at  bedside. Disposition: Likely SNF once cleared by general surgery.  Status is: Inpatient Remains inpatient appropriate because: Severity of illness   Consultants:  General Surgery: Dr. Ann 01/15/2023 Wound care RN Stephane Fought, RN 01/23/2023 Teleneurology: Dr.Mehta 01/13/2023  Procedures:  I&D abdominal wall abscess per general surgery: Dr. Dann Hummer 01/19/2023 EEG 01/15/2023 Irrigation and debridement abdominal wall abscess per general surgery: Dr. Ann 01/27/2023 CT abdomen and pelvis 01/15/2023, 01/16/2023  Antimicrobials:  Anti-infectives (From admission, onward)    Start     Dose/Rate Route Frequency Ordered Stop   01/25/23 0800  ciprofloxacin  (CIPRO ) tablet 500 mg  Status:  Discontinued        500 mg Oral 2 times daily 01/24/23 0850 01/24/23 2219   01/25/23 0000  azithromycin  (ZITHROMAX ) 500 mg in sodium chloride  0.9 % 250 mL IVPB  Status:  Discontinued        500 mg 250 mL/hr over 60 Minutes Intravenous Every 24 hours 01/24/23 2219 01/30/23 1255   01/24/23 2315  meropenem  (MERREM ) 1 g in sodium chloride  0.9 % 100 mL IVPB        1 g 200 mL/hr over 30 Minutes Intravenous Every 12 hours 01/24/23 2218     01/17/23 1130  ceFEPIme  (MAXIPIME ) 2 g in sodium chloride  0.9 % 100 mL IVPB        2 g 200 mL/hr over 30 Minutes Intravenous Every 12 hours 01/17/23 1038 01/24/23 0355   01/16/23 0945  cefTRIAXone  (ROCEPHIN ) 2 g in sodium chloride  0.9 % 100 mL  IVPB  Status:  Discontinued        2 g 200 mL/hr over 30 Minutes Intravenous Every 24 hours 01/16/23 0931 01/17/23 1038         Subjective: Sleeping but arousable.  Denies any chest pain or shortness of breath.  States some improvement with abdominal pain.  Feels some improvement with left upper extremity weakness.  States she is able to pull herself up with her right upper extremity better.  Objective: Vitals:   01/31/23 0802 01/31/23 1151 01/31/23 1459 01/31/23 1536  BP: (!) 141/72 (!) 169/80 (!) 144/65 (!) 151/77   Pulse: 96 (!) 102 100 100  Resp: 18 18 20 20   Temp: 99.3 F (37.4 C) 98.5 F (36.9 C) 99 F (37.2 C) 99.2 F (37.3 C)  TempSrc: Oral Oral Oral Oral  SpO2: 100% 99% 99% 95%  Weight:      Height:        Intake/Output Summary (Last 24 hours) at 01/31/2023 1812 Last data filed at 01/31/2023 1500 Gross per 24 hour  Intake 360 ml  Output 1900 ml  Net -1540 ml   Filed Weights   01/19/23 1300 01/21/23 0500 01/26/23 1400  Weight: 67.9 kg 69 kg 67.8 kg    Examination:  General exam: Appears calm and comfortable  Respiratory system: Clear to auscultation.  No wheezes, no crackles, no rhonchi.  Fair air movement.  Speaking in full sentences.  Respiratory effort normal. Cardiovascular system: Irregularly irregular. No JVD, murmurs, rubs, gallops or clicks. No pedal edema. Gastrointestinal system: Abdomen is nondistended, soft and nontender.  Abdominal binder in place.  Positive bowel sounds. Central nervous system: Alert and oriented. No focal neurological deficits. Extremities: Symmetric 5 x 5 power. Skin: No rashes, lesions or ulcers Psychiatry: Judgement and insight appear fair. Mood & affect appropriate.     Data Reviewed: I have personally reviewed following labs and imaging studies  CBC: Recent Labs  Lab 01/27/23 0628 01/28/23 0434 01/29/23 0450 01/30/23 0626 01/31/23 0536  WBC 13.8* 13.0* 9.1 7.5 8.8  HGB 8.2* 8.6* 8.1* 8.5* 9.7*  HCT 25.8* 26.3* 25.4* 26.5* 30.2*  MCV 84.0 83.2 84.4 83.9 84.4  PLT 217 246 231 243 268    Basic Metabolic Panel: Recent Labs  Lab 01/27/23 0628 01/28/23 0434 01/29/23 0450 01/30/23 0626 01/31/23 0536  NA 134* 134* 136 136 137  K 4.7 5.3* 4.7 5.1 4.7  CL 101 102 103 101 102  CO2 25 23 28 27 29   GLUCOSE 129* 165* 125* 118* 127*  BUN 20 21 20 19 16   CREATININE 0.48 0.53 0.49 0.53 0.53  CALCIUM  8.3* 8.6* 8.7* 8.7* 9.0    GFR: Estimated Creatinine Clearance: 44.1 mL/min (by C-G formula based on SCr of 0.53 mg/dL).  Liver  Function Tests: Recent Labs  Lab 01/27/23 0628 01/28/23 0434 01/29/23 0450 01/30/23 0626 01/31/23 0536  AST 46* 43* 43* 30 29  ALT 66* 60* 55* 41 40  ALKPHOS 67 74 65 63 63  BILITOT 0.6 0.5 0.5 0.4 0.4  PROT 6.3* 6.6 6.6 6.2* 6.8  ALBUMIN  1.9* 2.0* 2.0* 2.0* 2.1*    CBG: Recent Labs  Lab 01/30/23 0606 01/30/23 1426 01/30/23 2205 01/31/23 0549 01/31/23 1506  GLUCAP 110* 121* 187* 142* 116*     Recent Results (from the past 240 hours)  Culture, blood (Routine X 2) w Reflex to ID Panel     Status: None   Collection Time: 01/24/23  5:40 AM   Specimen: BLOOD LEFT HAND  Result Value Ref Range Status   Specimen Description BLOOD LEFT HAND  Final   Special Requests   Final    BOTTLES DRAWN AEROBIC ONLY Blood Culture results may not be optimal due to an inadequate volume of blood received in culture bottles   Culture   Final    NO GROWTH 5 DAYS Performed at [facility], [address], [place], [name] [address]    Report Status 01/29/2023 FINAL  Final  Culture, blood (Routine X 2) w Reflex to ID Panel     Status: Abnormal   Collection Time: 01/24/23  5:55 AM   Specimen: BLOOD LEFT HAND  Result Value Ref Range Status   Specimen Description BLOOD LEFT HAND  Final   Special Requests   Final    BOTTLES DRAWN AEROBIC AND ANAEROBIC Blood Culture results may not be optimal due to an inadequate volume of blood received in culture bottles   Culture  Setup Time   Final    GRAM POSITIVE COCCI ANAEROBIC BOTTLE ONLY CRITICAL RESULT CALLED TO, READ BACK BY AND VERIFIED WITH: PHARMD JESSICA MILLEN [phone] [id number] BY J RAZZAK, MT    Culture (A)  Final    STAPHYLOCOCCUS EPIDERMIDIS THE SIGNIFICANCE OF ISOLATING THIS ORGANISM FROM A SINGLE SET OF BLOOD CULTURES WHEN MULTIPLE SETS ARE DRAWN IS UNCERTAIN. PLEASE NOTIFY THE MICROBIOLOGY DEPARTMENT WITHIN ONE WEEK IF SPECIATION AND SENSITIVITIES ARE REQUIRED. Performed at [facility], [address] [place], [name] [address]     Report Status 01/30/2023 FINAL  Final  Blood Culture ID Panel (Reflexed)     Status: Abnormal   Collection Time: 01/24/23  5:55 AM  Result Value Ref Range Status   Enterococcus faecalis NOT DETECTED NOT DETECTED Final   Enterococcus Faecium NOT DETECTED NOT DETECTED Final   Listeria monocytogenes NOT DETECTED NOT DETECTED Final   Staphylococcus species DETECTED (A) NOT DETECTED Final    Comment: CRITICAL RESULT CALLED TO, READ BACK BY AND VERIFIED WITH: PHARMD JESSICA MILLEN [id number] BY J RAZZAK, MT    Staphylococcus aureus (BCID) NOT DETECTED NOT DETECTED Final   Staphylococcus epidermidis DETECTED (A) NOT DETECTED Final    Comment: Methicillin (oxacillin) resistant coagulase negative staphylococcus. Possible blood culture contaminant (unless isolated from more than one blood culture draw or clinical case suggests pathogenicity). No antibiotic treatment is indicated for blood  culture contaminants. CRITICAL RESULT CALLED TO, READ BACK BY AND VERIFIED WITH: PHARMD JESSICA MILLEN [phone] [id number] BY J RAZZAK, MT    Staphylococcus lugdunensis NOT DETECTED NOT DETECTED Final   Streptococcus species NOT DETECTED NOT DETECTED Final   Streptococcus agalactiae NOT DETECTED NOT DETECTED Final   Streptococcus pneumoniae NOT DETECTED NOT DETECTED Final   Streptococcus pyogenes NOT DETECTED NOT DETECTED Final   A.calcoaceticus-baumannii NOT DETECTED NOT DETECTED Final   Bacteroides fragilis NOT DETECTED NOT DETECTED Final   Enterobacterales NOT DETECTED NOT DETECTED Final   Enterobacter cloacae complex NOT DETECTED NOT DETECTED Final   Escherichia coli NOT DETECTED NOT DETECTED Final   Klebsiella aerogenes NOT DETECTED NOT DETECTED Final   Klebsiella oxytoca NOT DETECTED NOT DETECTED Final   Klebsiella pneumoniae NOT DETECTED NOT DETECTED Final   Proteus species NOT DETECTED NOT DETECTED Final   Salmonella species NOT DETECTED NOT DETECTED Final   Serratia marcescens NOT DETECTED NOT  DETECTED Final   Haemophilus influenzae NOT DETECTED NOT DETECTED Final   Neisseria meningitidis NOT DETECTED NOT DETECTED Final   Pseudomonas aeruginosa NOT DETECTED NOT DETECTED Final  Stenotrophomonas maltophilia NOT DETECTED NOT DETECTED Final   Candida albicans NOT DETECTED NOT DETECTED Final   Candida auris NOT DETECTED NOT DETECTED Final   Candida glabrata NOT DETECTED NOT DETECTED Final   Candida krusei NOT DETECTED NOT DETECTED Final   Candida parapsilosis NOT DETECTED NOT DETECTED Final   Candida tropicalis NOT DETECTED NOT DETECTED Final   Cryptococcus neoformans/gattii NOT DETECTED NOT DETECTED Final   Methicillin resistance mecA/C DETECTED (A) NOT DETECTED Final    Comment: CRITICAL RESULT CALLED TO, READ BACK BY AND VERIFIED WITH: PHARMD JESSICA MILLEN [phone] [id number] BY JINNY COMMON, MT Performed at [facility], [address], [place], [name] [address]   Respiratory (~20 pathogens) panel by PCR     Status: None   Collection Time: 01/24/23 10:24 PM   Specimen: Nasopharyngeal Swab; Respiratory  Result Value Ref Range Status   Adenovirus NOT DETECTED NOT DETECTED Final   Coronavirus 229E NOT DETECTED NOT DETECTED Final    Comment: (NOTE) The Coronavirus on the Respiratory Panel, DOES NOT test for the novel  Coronavirus (2019 nCoV)    Coronavirus HKU1 NOT DETECTED NOT DETECTED Final   Coronavirus NL63 NOT DETECTED NOT DETECTED Final   Coronavirus OC43 NOT DETECTED NOT DETECTED Final   Metapneumovirus NOT DETECTED NOT DETECTED Final   Rhinovirus / Enterovirus NOT DETECTED NOT DETECTED Final   Influenza A NOT DETECTED NOT DETECTED Final   Influenza B NOT DETECTED NOT DETECTED Final   Parainfluenza Virus 1 NOT DETECTED NOT DETECTED Final   Parainfluenza Virus 2 NOT DETECTED NOT DETECTED Final   Parainfluenza Virus 3 NOT DETECTED NOT DETECTED Final   Parainfluenza Virus 4 NOT DETECTED NOT DETECTED Final   Respiratory Syncytial Virus NOT DETECTED NOT DETECTED Final    Bordetella pertussis NOT DETECTED NOT DETECTED Final   Bordetella Parapertussis NOT DETECTED NOT DETECTED Final   Chlamydophila pneumoniae NOT DETECTED NOT DETECTED Final   Mycoplasma pneumoniae NOT DETECTED NOT DETECTED Final    Comment: Performed at [facility], [address], [place], [name] [address]  Aerobic/Anaerobic Culture w Gram Stain (surgical/deep wound)     Status: None (Preliminary result)   Collection Time: 01/27/23  1:39 PM   Specimen: Path fluid; Body Fluid  Result Value Ref Range Status   Specimen Description ABSCESS ABDOMEN  Final   Special Requests SWABS  Final   Gram Stain   Final    ABUNDANT WBC PRESENT, PREDOMINANTLY PMN RARE GRAM POSITIVE COCCI Performed at [facility], [address], [place], [name] [address]    Culture   Final    MODERATE STREPTOCOCCUS ANGINOSIS FEW ENTEROBACTER CLOACAE NO ANAEROBES ISOLATED; CULTURE IN PROGRESS FOR 5 DAYS    Report Status PENDING  Incomplete   Organism ID, Bacteria ENTEROBACTER CLOACAE  Final   Organism ID, Bacteria STREPTOCOCCUS ANGINOSIS  Final      Susceptibility   Enterobacter cloacae - MIC*    CEFEPIME  0.5 SENSITIVE Sensitive     CEFTAZIDIME >=64 RESISTANT Resistant     CIPROFLOXACIN  <=0.25 SENSITIVE Sensitive     GENTAMICIN <=1 SENSITIVE Sensitive     IMIPENEM 0.5 SENSITIVE Sensitive     TRIMETH /SULFA  <=20 SENSITIVE Sensitive     PIP/TAZO >=128 RESISTANT Resistant ug/mL    * FEW ENTEROBACTER CLOACAE   Streptococcus anginosis - MIC*    PENICILLIN <=0.06 SENSITIVE Sensitive     CEFTRIAXONE  <=0.12 SENSITIVE Sensitive     ERYTHROMYCIN >=8 RESISTANT Resistant     LEVOFLOXACIN 0.5  SENSITIVE Sensitive     VANCOMYCIN  0.5 SENSITIVE Sensitive     * MODERATE STREPTOCOCCUS ANGINOSIS         Radiology Studies: No results found.      Scheduled Meds:  amLODipine   10 mg Per Tube Daily   ascorbic acid   250 mg Per Tube Daily   calcium -vitamin D   1 tablet Per Tube BID   Chlorhexidine  Gluconate  Cloth  6 each Topical Daily   famotidine   20 mg Per Tube Daily   ferrous sulfate   60 mg of iron Per Tube Daily   fiber supplement (BANATROL TF)  60 mL Per Tube BID   free water   100 mL Per Tube Q4H   heparin  injection (subcutaneous)  5,000 Units Subcutaneous Q8H   insulin  aspart  0-15 Units Subcutaneous Q8H   lidocaine   2 patch Transdermal Daily   liver oil-zinc  oxide   Topical TID   multivitamin  15 mL Per Tube Daily   nystatin   5 mL Mouth/Throat QID   mouth rinse  15 mL Mouth Rinse 4 times per day   Continuous Infusions:  feeding supplement (OSMOLITE 1.2 CAL) 1,000 mL (01/30/23 1935)   meropenem  (MERREM ) IV 1 g (01/31/23 1005)     LOS: 17 days    Time spent: 40 minutes    Toribio Hummer, MD [facility]   To contact the attending provider between 7A-7P or the covering provider during after hours 7P-7A, please log into the web site [email or web address] and access using universal [place] password for that web site. If you do not have the password, please call the hospital operator.  01/31/2023, 6:12 PM

## 2023-01-31 NOTE — Progress Notes (Signed)
PT and Family refuses. States she does better on o2

## 2023-02-01 DIAGNOSIS — L02211 Cutaneous abscess of abdominal wall: Secondary | ICD-10-CM | POA: Diagnosis not present

## 2023-02-01 DIAGNOSIS — I619 Nontraumatic intracerebral hemorrhage, unspecified: Secondary | ICD-10-CM | POA: Diagnosis not present

## 2023-02-01 DIAGNOSIS — R7881 Bacteremia: Secondary | ICD-10-CM | POA: Diagnosis not present

## 2023-02-01 DIAGNOSIS — I6310 Cerebral infarction due to embolism of unspecified precerebral artery: Secondary | ICD-10-CM | POA: Diagnosis not present

## 2023-02-01 LAB — CBC WITH DIFFERENTIAL/PLATELET
Abs Immature Granulocytes: 0.19 10*3/uL — ABNORMAL HIGH (ref 0.00–0.07)
Basophils Absolute: 0 10*3/uL (ref 0.0–0.1)
Basophils Relative: 0 %
Eosinophils Absolute: 0.2 10*3/uL (ref 0.0–0.5)
Eosinophils Relative: 2 %
HCT: 29.8 % — ABNORMAL LOW (ref 36.0–46.0)
Hemoglobin: 9.6 g/dL — ABNORMAL LOW (ref 12.0–15.0)
Immature Granulocytes: 2 %
Lymphocytes Relative: 24 %
Lymphs Abs: 2.3 10*3/uL (ref 0.7–4.0)
MCH: 26.9 pg (ref 26.0–34.0)
MCHC: 32.2 g/dL (ref 30.0–36.0)
MCV: 83.5 fL (ref 80.0–100.0)
Monocytes Absolute: 0.7 10*3/uL (ref 0.1–1.0)
Monocytes Relative: 8 %
Neutro Abs: 5.9 10*3/uL (ref 1.7–7.7)
Neutrophils Relative %: 64 %
Platelets: 289 10*3/uL (ref 150–400)
RBC: 3.57 MIL/uL — ABNORMAL LOW (ref 3.87–5.11)
RDW: 16.3 % — ABNORMAL HIGH (ref 11.5–15.5)
WBC: 9.4 10*3/uL (ref 4.0–10.5)
nRBC: 0 % (ref 0.0–0.2)

## 2023-02-01 LAB — BASIC METABOLIC PANEL
Anion gap: 9 (ref 5–15)
BUN: 16 mg/dL (ref 8–23)
CO2: 25 mmol/L (ref 22–32)
Calcium: 9.1 mg/dL (ref 8.9–10.3)
Chloride: 100 mmol/L (ref 98–111)
Creatinine, Ser: 0.43 mg/dL — ABNORMAL LOW (ref 0.44–1.00)
GFR, Estimated: >60 mL/min (ref >60)
Glucose, Bld: 128 mg/dL — ABNORMAL HIGH (ref 70–99)
Potassium: 4.6 mmol/L (ref 3.5–5.1)
Sodium: 134 mmol/L — ABNORMAL LOW (ref 135–145)

## 2023-02-01 LAB — AEROBIC/ANAEROBIC CULTURE W GRAM STAIN (SURGICAL/DEEP WOUND)

## 2023-02-01 LAB — GLUCOSE, CAPILLARY
Glucose-Capillary: 145 mg/dL — ABNORMAL HIGH (ref 70–99)
Glucose-Capillary: 158 mg/dL — ABNORMAL HIGH (ref 70–99)
Glucose-Capillary: 125 mg/dL — ABNORMAL HIGH (ref 70–99)
Glucose-Capillary: 154 mg/dL — ABNORMAL HIGH (ref 70–99)

## 2023-02-01 MED ORDER — AMOXICILLIN 250 MG PO CHEW
500.0000 mg | CHEWABLE_TABLET | Freq: Three times a day (TID) | ORAL | Status: DC
Start: 2023-02-01 — End: 2023-02-01
  Filled 2023-02-01: qty 2

## 2023-02-01 MED ORDER — AMOXICILLIN-POT CLAVULANATE 250-62.5 MG/5ML PO SUSR
500.0000 mg | Freq: Three times a day (TID) | ORAL | Status: DC
  Filled 2023-02-01: qty 10

## 2023-02-01 MED ORDER — SULFAMETHOXAZOLE-TRIMETHOPRIM 200-40 MG/5ML PO SUSP
20.0000 mL | Freq: Two times a day (BID) | ORAL | Status: AC
  Administered 2023-02-01 – 2023-02-05 (×9): 20 mL
  Filled 2023-02-01 (×9): qty 20

## 2023-02-01 MED ORDER — SULFAMETHOXAZOLE-TRIMETHOPRIM 800-160 MG PO TABS: 1.0000 | ORAL_TABLET | Freq: Two times a day (BID) | ORAL | Status: DC

## 2023-02-01 MED ORDER — SULFAMETHOXAZOLE-TRIMETHOPRIM 200-40 MG/5ML PO SUSP
20.0000 mL | Freq: Two times a day (BID) | ORAL | Status: DC
  Filled 2023-02-01: qty 20

## 2023-02-01 MED ORDER — SULFAMETHOXAZOLE-TRIMETHOPRIM 800-160 MG PO TABS
1.0000 | ORAL_TABLET | Freq: Two times a day (BID) | ORAL | Status: DC
Start: 2023-02-01 — End: 2023-02-01

## 2023-02-01 MED ORDER — AMOXICILLIN 250 MG/5ML PO SUSR
250.0000 mg | Freq: Three times a day (TID) | ORAL | Status: AC
  Administered 2023-02-01 – 2023-02-05 (×13): 250 mg via ORAL
  Filled 2023-02-01 (×17): qty 5

## 2023-02-01 NOTE — Progress Notes (Signed)
 PROGRESS NOTE    Jocelyn [name]  FMW:[id number] DOB: 09/27/1931 DOA: 01/14/2023 PCP: [name], PA-C    No chief complaint on file.   Brief Narrative:  89 year old with a history of OA bilateral knees, HTN, GERD, chronic iron deficiency anemia, HLD, OSA on CPAP, pre-DM, and chronic atrial fibrillation on Eliquis  who was discharged from the Stroke Service at [facility] 12/11 after management of an acute right MCA territory infarct related to her atrial fibrillation.  During that preceding admission she presented with left-sided weakness and was found to have a right A1 and M2 occlusion for which she underwent successful mechanical thrombectomy/revascularization.  She was ultimately discharged to a SNF.   She returned to the ER at [facility] from her rehab facility on 12/14 after developing a new left-sided facial droop and left arm numbness with weakness.  CT head at presentation revealed interval development of scattered subarachnoid hemorrhage in the right sylvian fissure.  Her Eliquis  was reversed with Kcentra .  CTa revealed no LVO but atheromatous plaque with outpouchings extending posteriorly and inferiorly from both supraclinoid ICAs possibly representing small aneurysms.   Significant Events:  12/14 Admitted with [facility] 12/15 dislodged PEG tube 12/15 long term EEG w/o evidence of seizure activity  12/16 transferred out of the ICU, incision and drainage of left abdominal wall infection. 12/17 blood cultures positive for Enterobacter cloacae, cefepime  started 12/19 additional I&D of abdominal wall performed 12/21 [facility] assumed care of patient     Assessment & Plan:   Principal Problem:   ICH (intracerebral hemorrhage) (HCC) Active Problems:   Hyperlipidemia   Malnutrition of moderate degree   Abdominal wall abscess   Bacteremia due to Enterobacter species   Aspiration pneumonia (HCC)   Dysphagia   Oral candidiasis   Transaminitis   Chronic a-fib (HCC)   Pressure injury of  skin  Subarachnoid hemorrhage - possible cerebral aneurysm Noted at time of this readmission - occurred while on Eliquis  therapy - imaging has suggested outpouchings extending posteriorly and inferiorly from both supraclinoid ICAs which could reflect small aneurysms - these were noted on CTa as well as MRa - yearly MRa is recommended in follow-up by the Stroke Team. Per Neurology note, now not on antithrombotic   Recent CVA Admitted to the hospital 11/28-12/12/2022 for treatment of a right MCA territory infarct felt to be cardioembolic due to A-fib - an acute clot was noted at the right terminal ICA and the patient underwent intervention with subsequent revascularization   Abdominal wall infection General Surgery has been following with I&D performed 12/16 with Penrose drain placement and again 12/19 with placement of a second Penrose drain -wound care per General Surgery - IV antibiotic therapy resumed.  -pt taken to OR 12/27 for I/D. WBC normalized. thus far Enterobacter cloacae and Streptococcus aginosis noted out of cultures. -ID consulted for antibiotic duration and recommendations and antibiotics changed to oral Bactrim  and amoxicillin  through January 5 per ID recommendations. -General Surgery following and appreciate input and recommendations.   Enterobacter bacteremia Noted on 1 of 2 blood cultures drawn 12/15 - third-generation cephalosporin and Zosyn resistant - had earlier completed 7 full days of cefepime  therapy -Repeat blood cx thus far neg   Aspiration pneumonia Continue meropenem  Azithromycin  discontinued.  SLP following-dysphagia 1 diet upgraded to dysphagia 2 diet.. Aspiration precautions   Oral candidiasis Continue nystatin  swish/swallow   Dysphagia due to stroke PEG tube originally placed 12/9 during prior admission - this became dislodged 12/15 with the tract temporarily preserved using  a Foley catheter which was subsequently exchanged for a replacement PEG tube in IR  12/16 -Re-evaluated by SLP 12/26 given concerns of aspiration. Was cleared for dysphagia 1. -Continue current tube feeds. -Continue dysphagia 2 diet.    Abnormal EEG Routine EEG 12/14 raised the question of cortical dysfunction arising from the right hemisphere but with no evidence of seizure activity - long-term EEG revealed no evidence of active seizure activity. -Patient was seen in consultation by teleneurology.   HTN -Continue Norvasc  . -Continue to hold Avapro  and HCTZ.     Chronic atrial fibrillation -Rate controlled.   -Eliquis  discontinued due to [facility] - rate controlled   Transaminitis Statin on hold due to transaminitis which is improving and trending down. -Repeat LFTs in the next 24 hours and if continued improvement may consider resumption of statin in the next 24 to 48 hours or on discharge.   HLD LDL 33 - statin discontinued 12/17 due to elevated LFTs -Outpatient follow-up.  Pressure injury, Pressure Injury 01/21/23 Sacrum Stage 2 -  Partial thickness loss of dermis presenting as a shallow open injury with a red, pink wound bed without slough. red open sore (Active)  01/21/23 0800  Location: Sacrum  Location Orientation:   Staging: Stage 2 -  Partial thickness loss of dermis presenting as a shallow open injury with a red, pink wound bed without slough.  Wound Description (Comments): red open sore  Present on Admission: No     Pressure Injury 01/21/23 Heel Left;Posterior Deep Tissue Pressure Injury - Purple or maroon localized area of discolored intact skin or blood-filled blister due to damage of underlying soft tissue from pressure and/or shear. purplish area (Active)  01/21/23 2000  Location: Heel  Location Orientation: Left;Posterior  Staging: Deep Tissue Pressure Injury - Purple or maroon localized area of discolored intact skin or blood-filled blister due to damage of underlying soft tissue from pressure and/or shear.  Wound Description (Comments): purplish  area  Present on Admission: -- (unknown, no documentation found)         DVT prophylaxis: Heparin  Code Status: Full Family Communication: Updated patient.  No family at bedside. Disposition: Likely SNF once cleared by general surgery hopefully in the next 24 hours.  Status is: Inpatient Remains inpatient appropriate because: Severity of illness   Consultants:  General Surgery: Dr. Ann 01/15/2023 Wound care RN Stephane Fought, RN 01/23/2023 Teleneurology: Dr.Mehta 01/13/2023 ID: Dr. Efrain 02/01/2023  Procedures:  I&D abdominal wall abscess per general surgery: Dr. Dann Hummer 01/19/2023 EEG 01/15/2023 Irrigation and debridement abdominal wall abscess per general surgery: Dr. Ann 01/27/2023 CT abdomen and pelvis 01/15/2023, 01/16/2023  Antimicrobials:  Anti-infectives (From admission, onward)    Start     Dose/Rate Route Frequency Ordered Stop   01/25/23 0800  ciprofloxacin  (CIPRO ) tablet 500 mg  Status:  Discontinued        500 mg Oral 2 times daily 01/24/23 0850 01/24/23 2219   01/25/23 0000  azithromycin  (ZITHROMAX ) 500 mg in sodium chloride  0.9 % 250 mL IVPB  Status:  Discontinued        500 mg 250 mL/hr over 60 Minutes Intravenous Every 24 hours 01/24/23 2219 01/30/23 1255   01/24/23 2315  meropenem  (MERREM ) 1 g in sodium chloride  0.9 % 100 mL IVPB        1 g 200 mL/hr over 30 Minutes Intravenous Every 12 hours 01/24/23 2218     01/17/23 1130  ceFEPIme  (MAXIPIME ) 2 g in sodium chloride  0.9 % 100 mL IVPB  2 g 200 mL/hr over 30 Minutes Intravenous Every 12 hours 01/17/23 1038 01/24/23 0355   01/16/23 0945  cefTRIAXone  (ROCEPHIN ) 2 g in sodium chloride  0.9 % 100 mL IVPB  Status:  Discontinued        2 g 200 mL/hr over 30 Minutes Intravenous Every 24 hours 01/16/23 0931 01/17/23 1038         Subjective: Patient is sleeping but arousable.  No chest pain or shortness of breath.  Abdominal pain improving.  Some improvement with left upper extremity  weakness.  Objective: Vitals:   01/31/23 2111 01/31/23 2308 02/01/23 0343 02/01/23 0742  BP:  134/73 (!) 168/78 (!) 152/79  Pulse:  98 94 98  Resp:  18 18 18   Temp:  97.8 F (36.6 C) 97.7 F (36.5 C) 98.9 F (37.2 C)  TempSrc:  Oral Oral Oral  SpO2: 99% 100% 100% 100%  Weight:      Height:        Intake/Output Summary (Last 24 hours) at 02/01/2023 1207 Last data filed at 02/01/2023 0900 Gross per 24 hour  Intake 1060 ml  Output 1750 ml  Net -690 ml   Filed Weights   01/19/23 1300 01/21/23 0500 01/26/23 1400  Weight: 67.9 kg 69 kg 67.8 kg    Examination:  General exam: NAD. Respiratory system: Lungs clear to auscultation bilaterally.  No wheezes, no crackles, no rhonchi.  Fair air movement.  Speaking in full sentences.  Cardiovascular system: Irregularly irregular. No JVD, murmurs, rubs, gallops or clicks. No pedal edema. Gastrointestinal system: Abdomen is nondistended, soft and nontender.  Abdominal binder in place.  PEG tube intact.  Abdominal wound with no significant erythema or purulence noted.  Positive bowel sounds. Central nervous system: Alert and oriented. No focal neurological deficits. Extremities: Symmetric 5 x 5 power. Skin: No rashes, lesions or ulcers Psychiatry: Judgement and insight appear fair. Mood & affect appropriate.     Data Reviewed: I have personally reviewed following labs and imaging studies  CBC: Recent Labs  Lab 01/28/23 0434 01/29/23 0450 01/30/23 0626 01/31/23 0536 02/01/23 0654  WBC 13.0* 9.1 7.5 8.8 9.4  NEUTROABS  --   --   --   --  5.9  HGB 8.6* 8.1* 8.5* 9.7* 9.6*  HCT 26.3* 25.4* 26.5* 30.2* 29.8*  MCV 83.2 84.4 83.9 84.4 83.5  PLT 246 231 243 268 289    Basic Metabolic Panel: Recent Labs  Lab 01/28/23 0434 01/29/23 0450 01/30/23 0626 01/31/23 0536 02/01/23 0654  NA 134* 136 136 137 134*  K 5.3* 4.7 5.1 4.7 4.6  CL 102 103 101 102 100  CO2 23 28 27 29 25   GLUCOSE 165* 125* 118* 127* 128*  BUN 21 20 19 16 16    CREATININE 0.53 0.49 0.53 0.53 0.43*  CALCIUM  8.6* 8.7* 8.7* 9.0 9.1    GFR: Estimated Creatinine Clearance: 44.1 mL/min (A) (by C-G formula based on SCr of 0.43 mg/dL (L)).  Liver Function Tests: Recent Labs  Lab 01/27/23 0628 01/28/23 0434 01/29/23 0450 01/30/23 0626 01/31/23 0536  AST 46* 43* 43* 30 29  ALT 66* 60* 55* 41 40  ALKPHOS 67 74 65 63 63  BILITOT 0.6 0.5 0.5 0.4 0.4  PROT 6.3* 6.6 6.6 6.2* 6.8  ALBUMIN  1.9* 2.0* 2.0* 2.0* 2.1*    CBG: Recent Labs  Lab 01/30/23 2205 01/31/23 0549 01/31/23 1506 01/31/23 2212 02/01/23 0612  GLUCAP 187* 142* 116* 123* 145*     Recent Results (from the past 240  hours)  Culture, blood (Routine X 2) w Reflex to ID Panel     Status: None   Collection Time: 01/24/23  5:40 AM   Specimen: BLOOD LEFT HAND  Result Value Ref Range Status   Specimen Description BLOOD LEFT HAND  Final   Special Requests   Final    BOTTLES DRAWN AEROBIC ONLY Blood Culture results may not be optimal due to an inadequate volume of blood received in culture bottles   Culture   Final    NO GROWTH 5 DAYS Performed at [facility], [address], [place], [name] [address]    Report Status 01/29/2023 FINAL  Final  Culture, blood (Routine X 2) w Reflex to ID Panel     Status: Abnormal   Collection Time: 01/24/23  5:55 AM   Specimen: BLOOD LEFT HAND  Result Value Ref Range Status   Specimen Description BLOOD LEFT HAND  Final   Special Requests   Final    BOTTLES DRAWN AEROBIC AND ANAEROBIC Blood Culture results may not be optimal due to an inadequate volume of blood received in culture bottles   Culture  Setup Time   Final    GRAM POSITIVE COCCI ANAEROBIC BOTTLE ONLY CRITICAL RESULT CALLED TO, READ BACK BY AND VERIFIED WITH: PHARMD JESSICA MILLEN [phone] [id number] BY J RAZZAK, MT    Culture (A)  Final    STAPHYLOCOCCUS EPIDERMIDIS THE SIGNIFICANCE OF ISOLATING THIS ORGANISM FROM A SINGLE SET OF BLOOD CULTURES WHEN MULTIPLE SETS ARE DRAWN IS  UNCERTAIN. PLEASE NOTIFY THE MICROBIOLOGY DEPARTMENT WITHIN ONE WEEK IF SPECIATION AND SENSITIVITIES ARE REQUIRED. Performed at [facility], [address] [place], [name] [address]    Report Status 01/30/2023 FINAL  Final  Blood Culture ID Panel (Reflexed)     Status: Abnormal   Collection Time: 01/24/23  5:55 AM  Result Value Ref Range Status   Enterococcus faecalis NOT DETECTED NOT DETECTED Final   Enterococcus Faecium NOT DETECTED NOT DETECTED Final   Listeria monocytogenes NOT DETECTED NOT DETECTED Final   Staphylococcus species DETECTED (A) NOT DETECTED Final    Comment: CRITICAL RESULT CALLED TO, READ BACK BY AND VERIFIED WITH: PHARMD JESSICA MILLEN [phone] [id number] BY J RAZZAK, MT    Staphylococcus aureus (BCID) NOT DETECTED NOT DETECTED Final   Staphylococcus epidermidis DETECTED (A) NOT DETECTED Final    Comment: Methicillin (oxacillin) resistant coagulase negative staphylococcus. Possible blood culture contaminant (unless isolated from more than one blood culture draw or clinical case suggests pathogenicity). No antibiotic treatment is indicated for blood  culture contaminants. CRITICAL RESULT CALLED TO, READ BACK BY AND VERIFIED WITH: PHARMD JESSICA MILLEN [phone] [id number] BY J RAZZAK, MT    Staphylococcus lugdunensis NOT DETECTED NOT DETECTED Final   Streptococcus species NOT DETECTED NOT DETECTED Final   Streptococcus agalactiae NOT DETECTED NOT DETECTED Final   Streptococcus pneumoniae NOT DETECTED NOT DETECTED Final   Streptococcus pyogenes NOT DETECTED NOT DETECTED Final   A.calcoaceticus-baumannii NOT DETECTED NOT DETECTED Final   Bacteroides fragilis NOT DETECTED NOT DETECTED Final   Enterobacterales NOT DETECTED NOT DETECTED Final   Enterobacter cloacae complex NOT DETECTED NOT DETECTED Final   Escherichia coli NOT DETECTED NOT DETECTED Final   Klebsiella aerogenes NOT DETECTED NOT DETECTED Final   Klebsiella oxytoca NOT DETECTED NOT DETECTED Final   Klebsiella  pneumoniae NOT DETECTED NOT DETECTED Final   Proteus species NOT DETECTED NOT DETECTED Final   Salmonella species NOT DETECTED NOT DETECTED Final   Serratia  marcescens NOT DETECTED NOT DETECTED Final   Haemophilus influenzae NOT DETECTED NOT DETECTED Final   Neisseria meningitidis NOT DETECTED NOT DETECTED Final   Pseudomonas aeruginosa NOT DETECTED NOT DETECTED Final   Stenotrophomonas maltophilia NOT DETECTED NOT DETECTED Final   Candida albicans NOT DETECTED NOT DETECTED Final   Candida auris NOT DETECTED NOT DETECTED Final   Candida glabrata NOT DETECTED NOT DETECTED Final   Candida krusei NOT DETECTED NOT DETECTED Final   Candida parapsilosis NOT DETECTED NOT DETECTED Final   Candida tropicalis NOT DETECTED NOT DETECTED Final   Cryptococcus neoformans/gattii NOT DETECTED NOT DETECTED Final   Methicillin resistance mecA/C DETECTED (A) NOT DETECTED Final    Comment: CRITICAL RESULT CALLED TO, READ BACK BY AND VERIFIED WITH: PHARMD JESSICA MILLEN [phone] [id number] BY JINNY COMMON, MT Performed at [facility], [address] [place], [name] [address]   Respiratory (~20 pathogens) panel by PCR     Status: None   Collection Time: 01/24/23 10:24 PM   Specimen: Nasopharyngeal Swab; Respiratory  Result Value Ref Range Status   Adenovirus NOT DETECTED NOT DETECTED Final   Coronavirus 229E NOT DETECTED NOT DETECTED Final    Comment: (NOTE) The Coronavirus on the Respiratory Panel, DOES NOT test for the novel  Coronavirus (2019 nCoV)    Coronavirus HKU1 NOT DETECTED NOT DETECTED Final   Coronavirus NL63 NOT DETECTED NOT DETECTED Final   Coronavirus OC43 NOT DETECTED NOT DETECTED Final   Metapneumovirus NOT DETECTED NOT DETECTED Final   Rhinovirus / Enterovirus NOT DETECTED NOT DETECTED Final   Influenza A NOT DETECTED NOT DETECTED Final   Influenza B NOT DETECTED NOT DETECTED Final   Parainfluenza Virus 1 NOT DETECTED NOT DETECTED Final   Parainfluenza Virus 2 NOT DETECTED NOT  DETECTED Final   Parainfluenza Virus 3 NOT DETECTED NOT DETECTED Final   Parainfluenza Virus 4 NOT DETECTED NOT DETECTED Final   Respiratory Syncytial Virus NOT DETECTED NOT DETECTED Final   Bordetella pertussis NOT DETECTED NOT DETECTED Final   Bordetella Parapertussis NOT DETECTED NOT DETECTED Final   Chlamydophila pneumoniae NOT DETECTED NOT DETECTED Final   Mycoplasma pneumoniae NOT DETECTED NOT DETECTED Final    Comment: Performed at [facility], [address], [place], [name] [address]  Aerobic/Anaerobic Culture w Gram Stain (surgical/deep wound)     Status: None   Collection Time: 01/27/23  1:39 PM   Specimen: Path fluid; Body Fluid  Result Value Ref Range Status   Specimen Description ABSCESS ABDOMEN  Final   Special Requests SWABS  Final   Gram Stain   Final    ABUNDANT WBC PRESENT, PREDOMINANTLY PMN RARE GRAM POSITIVE COCCI    Culture   Final    MODERATE STREPTOCOCCUS ANGINOSIS FEW ENTEROBACTER CLOACAE NO ANAEROBES ISOLATED Performed at [facility], [address] [place], [name] [address]    Report Status 02/01/2023 FINAL  Final   Organism ID, Bacteria ENTEROBACTER CLOACAE  Final   Organism ID, Bacteria STREPTOCOCCUS ANGINOSIS  Final      Susceptibility   Enterobacter cloacae - MIC*    CEFEPIME  0.5 SENSITIVE Sensitive     CEFTAZIDIME >=64 RESISTANT Resistant     CIPROFLOXACIN  <=0.25 SENSITIVE Sensitive     GENTAMICIN <=1 SENSITIVE Sensitive     IMIPENEM 0.5 SENSITIVE Sensitive     TRIMETH /SULFA  <=20 SENSITIVE Sensitive     PIP/TAZO >=128 RESISTANT Resistant ug/mL    * FEW ENTEROBACTER CLOACAE   Streptococcus anginosis - MIC*  PENICILLIN <=0.06 SENSITIVE Sensitive     CEFTRIAXONE  <=0.12 SENSITIVE Sensitive     ERYTHROMYCIN >=8 RESISTANT Resistant     LEVOFLOXACIN 0.5 SENSITIVE Sensitive     VANCOMYCIN  0.5 SENSITIVE Sensitive     * MODERATE STREPTOCOCCUS ANGINOSIS         Radiology Studies: No results found.      Scheduled Meds:   amLODipine   10 mg Per Tube Daily   ascorbic acid   250 mg Per Tube Daily   calcium -vitamin D   1 tablet Per Tube BID   Chlorhexidine  Gluconate Cloth  6 each Topical Daily   famotidine   20 mg Per Tube Daily   ferrous sulfate   60 mg of iron Per Tube Daily   fiber supplement (BANATROL TF)  60 mL Per Tube BID   free water   100 mL Per Tube Q4H   heparin  injection (subcutaneous)  5,000 Units Subcutaneous Q8H   insulin  aspart  0-15 Units Subcutaneous Q8H   lidocaine   2 patch Transdermal Daily   liver oil-zinc  oxide   Topical TID   multivitamin  15 mL Per Tube Daily   nystatin   5 mL Mouth/Throat QID   mouth rinse  15 mL Mouth Rinse 4 times per day   Continuous Infusions:  feeding supplement (OSMOLITE 1.2 CAL) 55 mL/hr at 02/01/23 0132   meropenem  (MERREM ) IV 1 g (02/01/23 1043)     LOS: 18 days    Time spent: 40 minutes    Toribio Hummer, MD [facility]   To contact the attending provider between 7A-7P or the covering provider during after hours 7P-7A, please log into the web site [email or web address] and access using universal [place] password for that web site. If you do not have the password, please call the hospital operator.  02/01/2023, 12:07 PM

## 2023-02-01 NOTE — Consult Note (Signed)
 [facility]       Reason for Consult: abscess    Referring Physician: Dr. JONETTA Hummer  Principal Problem:   ICH (intracerebral hemorrhage) (HCC) Active Problems:   Hyperlipidemia   Malnutrition of moderate degree   Abdominal wall abscess   Bacteremia due to Enterobacter species   Aspiration pneumonia (HCC)   Dysphagia   Oral candidiasis   Transaminitis   Chronic a-fib (HCC)   Pressure injury of skin    amLODipine   10 mg Per Tube Daily   ascorbic acid   250 mg Per Tube Daily   calcium -vitamin D   1 tablet Per Tube BID   Chlorhexidine  Gluconate Cloth  6 each Topical Daily   famotidine   20 mg Per Tube Daily   ferrous sulfate   60 mg of iron Per Tube Daily   fiber supplement (BANATROL TF)  60 mL Per Tube BID   free water   100 mL Per Tube Q4H   heparin  injection (subcutaneous)  5,000 Units Subcutaneous Q8H   insulin  aspart  0-15 Units Subcutaneous Q8H   lidocaine   2 patch Transdermal Daily   liver oil-zinc  oxide   Topical TID   multivitamin  15 mL Per Tube Daily   nystatin   5 mL Mouth/Throat QID   mouth rinse  15 mL Mouth Rinse 4 times per day    Recommendations: Oral Bactrim  + amoxicillin  through January 5, dose and route per pharmacy   Assessment: At this time, wound looks good with no purulence noted and overall clinically appears improved with no signs of active infection.  I recommend continuing with treatment through January 5 and then can stop.  HPI: Jocelyn [name] is a 89 y.o. female with a history of HTN, GERD, OSA and chronic afib with a PEG tube in place and dysphagic diet came in 12/11 with an acute CVA.  Also with an abdominal wall abscess around her PEG tube and taken to the OR 12/27 for debridement.  She did have 1 positive blood culture with Enterobacter cloaca K and has been on cefepime  then meropenem  since 12/17.  Repeat blood cultures have remained negative otherwise and culture done during the abscess debridement notable also  for Enterobacter cloacae and Streptococcus anginosus.  She is continued on meropenem  for this.  She has been afebrile since 1218 and white blood cell count has normalized.   Review of Systems:  Constitutional: negative for fevers and chills All other systems reviewed and are negative    Past Medical History:  Diagnosis Date   Arthritis of both knees 08/01/2015   Colitis    Essential hypertension, benign 07/28/2014   GERD (gastroesophageal reflux disease)    Hx of iron deficiency anemia 09/02/2015   Hyperlipidemia    Hypertension    Lumbar pain    Medicare annual wellness visit, subsequent 04/07/2017   OSA on CPAP 08/01/2015   Overweight (BMI 25.0-29.9) 07/10/2013   Prediabetes 09/02/2015   SOB (shortness of breath) 07/10/2013    Social History   Tobacco Use   Smoking status: Never   Smokeless tobacco: Never  Vaping Use   Vaping status: Never Used  Substance Use Topics   Alcohol use: No    Alcohol/week: 0.0 standard drinks of alcohol   Drug use: No    Family History  Problem Relation Age of Onset   Diabetes Mother    Heart disease Mother    Cancer Father        lung; smoker   Hypertension  Daughter    Diabetes Daughter    Cancer Daughter        breast   Anuerysm Son    Breast cancer Neg Hx     No Known Allergies  Physical Exam: Constitutional: in no apparent distress  Vitals:   02/01/23 0742 02/01/23 1216  BP: (!) 152/79 (!) 157/72  Pulse: 98 96  Resp: 18 18  Temp: 98.9 F (37.2 C) 97.7 F (36.5 C)  SpO2: 100% 100%   EYES: anicteric Respiratory: normal respiratory effort GI: abdominal wound with no purulence, no surrounding erythema  Lab Results  Component Value Date   WBC 9.4 02/01/2023   HGB 9.6 (L) 02/01/2023   HCT 29.8 (L) 02/01/2023   MCV 83.5 02/01/2023   PLT 289 02/01/2023    Lab Results  Component Value Date   CREATININE 0.43 (L) 02/01/2023   BUN 16 02/01/2023   NA 134 (L) 02/01/2023   K 4.6 02/01/2023   CL 100 02/01/2023   CO2 25  02/01/2023    Lab Results  Component Value Date   ALT 40 01/31/2023   AST 29 01/31/2023   ALKPHOS 63 01/31/2023     Microbiology: Recent Results (from the past 240 hours)  Culture, blood (Routine X 2) w Reflex to ID Panel     Status: None   Collection Time: 01/24/23  5:40 AM   Specimen: BLOOD LEFT HAND  Result Value Ref Range Status   Specimen Description BLOOD LEFT HAND  Final   Special Requests   Final    BOTTLES DRAWN AEROBIC ONLY Blood Culture results may not be optimal due to an inadequate volume of blood received in culture bottles   Culture   Final    NO GROWTH 5 DAYS Performed at [facility], [address], [place], [name] [address]    Report Status 01/29/2023 FINAL  Final  Culture, blood (Routine X 2) w Reflex to ID Panel     Status: Abnormal   Collection Time: 01/24/23  5:55 AM   Specimen: BLOOD LEFT HAND  Result Value Ref Range Status   Specimen Description BLOOD LEFT HAND  Final   Special Requests   Final    BOTTLES DRAWN AEROBIC AND ANAEROBIC Blood Culture results may not be optimal due to an inadequate volume of blood received in culture bottles   Culture  Setup Time   Final    GRAM POSITIVE COCCI ANAEROBIC BOTTLE ONLY CRITICAL RESULT CALLED TO, READ BACK BY AND VERIFIED WITH: PHARMD JESSICA MILLEN [id number] BY J RAZZAK, MT    Culture (A)  Final    STAPHYLOCOCCUS EPIDERMIDIS THE SIGNIFICANCE OF ISOLATING THIS ORGANISM FROM A SINGLE SET OF BLOOD CULTURES WHEN MULTIPLE SETS ARE DRAWN IS UNCERTAIN. PLEASE NOTIFY THE MICROBIOLOGY DEPARTMENT WITHIN ONE WEEK IF SPECIATION AND SENSITIVITIES ARE REQUIRED. Performed at [facility], [address], [place], [name] [address]    Report Status 01/30/2023 FINAL  Final  Blood Culture ID Panel (Reflexed)     Status: Abnormal   Collection Time: 01/24/23  5:55 AM  Result Value Ref Range Status   Enterococcus faecalis NOT DETECTED NOT DETECTED Final   Enterococcus Faecium NOT DETECTED NOT DETECTED Final    Listeria monocytogenes NOT DETECTED NOT DETECTED Final   Staphylococcus species DETECTED (A) NOT DETECTED Final    Comment: CRITICAL RESULT CALLED TO, READ BACK BY AND VERIFIED WITH: PHARMD JESSICA MILLEN [id number] BY J RAZZAK, MT    Staphylococcus aureus (BCID) NOT  DETECTED NOT DETECTED Final   Staphylococcus epidermidis DETECTED (A) NOT DETECTED Final    Comment: Methicillin (oxacillin) resistant coagulase negative staphylococcus. Possible blood culture contaminant (unless isolated from more than one blood culture draw or clinical case suggests pathogenicity). No antibiotic treatment is indicated for blood  culture contaminants. CRITICAL RESULT CALLED TO, READ BACK BY AND VERIFIED WITH: PHARMD JESSICA MILLEN [phone] [id number] BY J RAZZAK, MT    Staphylococcus lugdunensis NOT DETECTED NOT DETECTED Final   Streptococcus species NOT DETECTED NOT DETECTED Final   Streptococcus agalactiae NOT DETECTED NOT DETECTED Final   Streptococcus pneumoniae NOT DETECTED NOT DETECTED Final   Streptococcus pyogenes NOT DETECTED NOT DETECTED Final   A.calcoaceticus-baumannii NOT DETECTED NOT DETECTED Final   Bacteroides fragilis NOT DETECTED NOT DETECTED Final   Enterobacterales NOT DETECTED NOT DETECTED Final   Enterobacter cloacae complex NOT DETECTED NOT DETECTED Final   Escherichia coli NOT DETECTED NOT DETECTED Final   Klebsiella aerogenes NOT DETECTED NOT DETECTED Final   Klebsiella oxytoca NOT DETECTED NOT DETECTED Final   Klebsiella pneumoniae NOT DETECTED NOT DETECTED Final   Proteus species NOT DETECTED NOT DETECTED Final   Salmonella species NOT DETECTED NOT DETECTED Final   Serratia marcescens NOT DETECTED NOT DETECTED Final   Haemophilus influenzae NOT DETECTED NOT DETECTED Final   Neisseria meningitidis NOT DETECTED NOT DETECTED Final   Pseudomonas aeruginosa NOT DETECTED NOT DETECTED Final   Stenotrophomonas maltophilia NOT DETECTED NOT DETECTED Final   Candida albicans NOT DETECTED NOT  DETECTED Final   Candida auris NOT DETECTED NOT DETECTED Final   Candida glabrata NOT DETECTED NOT DETECTED Final   Candida krusei NOT DETECTED NOT DETECTED Final   Candida parapsilosis NOT DETECTED NOT DETECTED Final   Candida tropicalis NOT DETECTED NOT DETECTED Final   Cryptococcus neoformans/gattii NOT DETECTED NOT DETECTED Final   Methicillin resistance mecA/C DETECTED (A) NOT DETECTED Final    Comment: CRITICAL RESULT CALLED TO, READ BACK BY AND VERIFIED WITH: PHARMD JESSICA MILLEN [phone] [id number] BY JINNY COMMON, MT Performed at [facility], [address], [place], [name] [address]   Respiratory (~20 pathogens) panel by PCR     Status: None   Collection Time: 01/24/23 10:24 PM   Specimen: Nasopharyngeal Swab; Respiratory  Result Value Ref Range Status   Adenovirus NOT DETECTED NOT DETECTED Final   Coronavirus 229E NOT DETECTED NOT DETECTED Final    Comment: (NOTE) The Coronavirus on the Respiratory Panel, DOES NOT test for the novel  Coronavirus (2019 nCoV)    Coronavirus HKU1 NOT DETECTED NOT DETECTED Final   Coronavirus NL63 NOT DETECTED NOT DETECTED Final   Coronavirus OC43 NOT DETECTED NOT DETECTED Final   Metapneumovirus NOT DETECTED NOT DETECTED Final   Rhinovirus / Enterovirus NOT DETECTED NOT DETECTED Final   Influenza A NOT DETECTED NOT DETECTED Final   Influenza B NOT DETECTED NOT DETECTED Final   Parainfluenza Virus 1 NOT DETECTED NOT DETECTED Final   Parainfluenza Virus 2 NOT DETECTED NOT DETECTED Final   Parainfluenza Virus 3 NOT DETECTED NOT DETECTED Final   Parainfluenza Virus 4 NOT DETECTED NOT DETECTED Final   Respiratory Syncytial Virus NOT DETECTED NOT DETECTED Final   Bordetella pertussis NOT DETECTED NOT DETECTED Final   Bordetella Parapertussis NOT DETECTED NOT DETECTED Final   Chlamydophila pneumoniae NOT DETECTED NOT DETECTED Final   Mycoplasma pneumoniae NOT DETECTED NOT DETECTED Final    Comment: Performed at [facility], [address] [place], [name] [address]  Aerobic/Anaerobic Culture w Gram Stain (surgical/deep wound)     Status: None   Collection Time: 01/27/23  1:39 PM   Specimen: Path fluid; Body Fluid  Result Value Ref Range Status   Specimen Description ABSCESS ABDOMEN  Final   Special Requests SWABS  Final   Gram Stain   Final    ABUNDANT WBC PRESENT, PREDOMINANTLY PMN RARE GRAM POSITIVE COCCI    Culture   Final    MODERATE STREPTOCOCCUS ANGINOSIS FEW ENTEROBACTER CLOACAE NO ANAEROBES ISOLATED Performed at [facility], [address], [place], [name] [address]    Report Status 02/01/2023 FINAL  Final   Organism ID, Bacteria ENTEROBACTER CLOACAE  Final   Organism ID, Bacteria STREPTOCOCCUS ANGINOSIS  Final      Susceptibility   Enterobacter cloacae - MIC*    CEFEPIME  0.5 SENSITIVE Sensitive     CEFTAZIDIME >=64 RESISTANT Resistant     CIPROFLOXACIN  <=0.25 SENSITIVE Sensitive     GENTAMICIN <=1 SENSITIVE Sensitive     IMIPENEM 0.5 SENSITIVE Sensitive     TRIMETH /SULFA  <=20 SENSITIVE Sensitive     PIP/TAZO >=128 RESISTANT Resistant ug/mL    * FEW ENTEROBACTER CLOACAE   Streptococcus anginosis - MIC*    PENICILLIN <=0.06 SENSITIVE Sensitive     CEFTRIAXONE  <=0.12 SENSITIVE Sensitive     ERYTHROMYCIN >=8 RESISTANT Resistant     LEVOFLOXACIN 0.5 SENSITIVE Sensitive     VANCOMYCIN  0.5 SENSITIVE Sensitive     * MODERATE STREPTOCOCCUS ANGINOSIS    Lamar LELON Bucks, MD [email or web address] 02/01/2023, 1:15 PM

## 2023-02-01 NOTE — Plan of Care (Signed)
  Problem: Education: Goal: Knowledge of General Education information will improve Description: Including pain rating scale, medication(s)/side effects and non-pharmacologic comfort measures Outcome: Progressing   Problem: Clinical Measurements: Goal: Ability to maintain clinical measurements within normal limits will improve Outcome: Progressing   Problem: Nutrition: Goal: Adequate nutrition will be maintained Outcome: Progressing   Problem: Elimination: Goal: Will not experience complications related to bowel motility Outcome: Progressing   Problem: Pain Management: Goal: General experience of comfort will improve Outcome: Progressing   Problem: Safety: Goal: Ability to remain free from injury will improve Outcome: Progressing   Problem: Skin Integrity: Goal: Risk for impaired skin integrity will decrease Outcome: Progressing

## 2023-02-01 NOTE — Progress Notes (Signed)
 Pharmacy Antibiotic Note  Jocelyn [name] is a 89 y.o. female admitted on 01/14/2023 with acute right MCA infarct undergoing mechanical thrombectomy/revascularization. Patient PEG tube dislodged and replaced with noted abscess - s/p I&D with cultures sent 12/16 that are NGTD. Found to have enterobacter cloacae (R-fortaz, Zosyn) in 1/3 blood cultures. Patient was treated with cefepime  for 7 days (ended on 12/23).   Patient now found to have lower lobe pneumonia and team is to start up antibiotics. Pharmacy has been consulted for meropenem  dosing. Per ID, recommended to de-escalate to Bactrim  and Amoxicillin  per sensitivities through 1/5.   Renal function stable low - 0.43  Plan: -Discontinue Meropenem  1g IV every 12 hours -Initiate Amoxicillin  PO 500mg  Q12H -Initiate Bactim suspension 200-40mg /11mL Q12H -Monitor renal function -Monitor signs of clinical improvement and repeat cultures > if improves and cultures no growth could de-escalate to cefepime  2g IV every 12 hours -Follow up LOT   Height: 5' 3 (160 cm) Weight: 67.8 kg (149 lb 8 oz) IBW/kg (Calculated) : 52.4  Temp (24hrs), Avg:98.5 F (36.9 C), Min:97.7 F (36.5 C), Max:99.2 F (37.3 C)  Recent Labs  Lab 01/28/23 0434 01/29/23 0450 01/30/23 0626 01/31/23 0536 02/01/23 0654  WBC 13.0* 9.1 7.5 8.8 9.4  CREATININE 0.53 0.49 0.53 0.53 0.43*    Estimated Creatinine Clearance: 44.1 mL/min (A) (by C-G formula based on SCr of 0.43 mg/dL (L)).    No Known Allergies  Antimicrobials this admission: Ceftriaxone  12/16 > 12/17  Cefepime  12/17 >> 12/23 Merrem  12/24 >> Azithromycin  12/24 >>12/30   Microbiology results: 12/15 BCx: 1/3 Enterobacter cloacae  12/16 PEG drainage: NGTD 12/24 repeat Bcx: staph epi 12/24 Resp panel/COVID PCR 12/27 abscess abd- e. Cloacae (R- ceftaz / pip/tazo) / mod strep ang 12/14 MRSA PCR: negative  Marget Hench, PharmD PGY1 Pharmacy Resident

## 2023-02-01 NOTE — Plan of Care (Signed)
 Cooperative throughout the day. No complaint of pain.  Problem: Activity: Goal: Risk for activity intolerance will decrease Outcome: Progressing   Problem: Nutrition: Goal: Adequate nutrition will be maintained Outcome: Progressing   Problem: Safety: Goal: Ability to remain free from injury will improve Outcome: Progressing   Problem: Self-Care: Goal: Ability to communicate needs accurately will improve Outcome: Progressing   Problem: Intracerebral Hemorrhage Tissue Perfusion: Goal: Complications of Intracerebral Hemorrhage will be minimized Outcome: Progressing   Problem: Skin Integrity: Goal: Risk for impaired skin integrity will decrease Outcome: Progressing

## 2023-02-02 DIAGNOSIS — R7401 Elevation of levels of liver transaminase levels: Secondary | ICD-10-CM | POA: Diagnosis not present

## 2023-02-02 DIAGNOSIS — R1310 Dysphagia, unspecified: Secondary | ICD-10-CM | POA: Diagnosis not present

## 2023-02-02 DIAGNOSIS — L02211 Cutaneous abscess of abdominal wall: Secondary | ICD-10-CM | POA: Diagnosis not present

## 2023-02-02 DIAGNOSIS — I619 Nontraumatic intracerebral hemorrhage, unspecified: Secondary | ICD-10-CM | POA: Diagnosis not present

## 2023-02-02 DIAGNOSIS — E782 Mixed hyperlipidemia: Secondary | ICD-10-CM

## 2023-02-02 LAB — COMPREHENSIVE METABOLIC PANEL
ALT: 36 U/L (ref 0–44)
AST: 35 U/L (ref 15–41)
Albumin: 2.1 g/dL — ABNORMAL LOW (ref 3.5–5.0)
Alkaline Phosphatase: 65 U/L (ref 38–126)
Anion gap: 8 (ref 5–15)
BUN: 14 mg/dL (ref 8–23)
CO2: 25 mmol/L (ref 22–32)
Calcium: 9.1 mg/dL (ref 8.9–10.3)
Chloride: 102 mmol/L (ref 98–111)
Creatinine, Ser: 0.53 mg/dL (ref 0.44–1.00)
GFR, Estimated: >60 mL/min (ref >60)
Glucose, Bld: 123 mg/dL — ABNORMAL HIGH (ref 70–99)
Potassium: 4.9 mmol/L (ref 3.5–5.1)
Sodium: 135 mmol/L (ref 135–145)
Total Bilirubin: 0.2 mg/dL (ref 0.0–1.2)
Total Protein: 6.7 g/dL (ref 6.5–8.1)

## 2023-02-02 LAB — CBC
HCT: 29.5 % — ABNORMAL LOW (ref 36.0–46.0)
Hemoglobin: 9.5 g/dL — ABNORMAL LOW (ref 12.0–15.0)
MCH: 26.7 pg (ref 26.0–34.0)
MCHC: 32.2 g/dL (ref 30.0–36.0)
MCV: 82.9 fL (ref 80.0–100.0)
Platelets: 264 10*3/uL (ref 150–400)
RBC: 3.56 MIL/uL — ABNORMAL LOW (ref 3.87–5.11)
RDW: 16.2 % — ABNORMAL HIGH (ref 11.5–15.5)
WBC: 9.1 10*3/uL (ref 4.0–10.5)
nRBC: 0.2 % (ref 0.0–0.2)

## 2023-02-02 LAB — GLUCOSE, CAPILLARY
Glucose-Capillary: 129 mg/dL — ABNORMAL HIGH (ref 70–99)
Glucose-Capillary: 114 mg/dL — ABNORMAL HIGH (ref 70–99)
Glucose-Capillary: 134 mg/dL — ABNORMAL HIGH (ref 70–99)

## 2023-02-02 MED ORDER — ORAL CARE MOUTH RINSE: 15.0000 mL | OROMUCOSAL | Status: DC | PRN

## 2023-02-02 MED ORDER — ZINC OXIDE 40 % EX OINT: TOPICAL_OINTMENT | Freq: Three times a day (TID) | CUTANEOUS | 0 refills | Status: DC

## 2023-02-02 MED ORDER — ADULT MULTIVITAMIN LIQUID CH: 15.0000 mL | Freq: Every day | ORAL | Status: DC

## 2023-02-02 MED ORDER — BANATROL TF EN LIQD: 60.0000 mL | Freq: Two times a day (BID) | ENTERAL | Status: DC

## 2023-02-02 MED ORDER — ASCORBIC ACID 250 MG PO TABS: 250.0000 mg | ORAL_TABLET | Freq: Every day | ORAL | Status: DC

## 2023-02-02 MED ORDER — NYSTATIN 100000 UNIT/ML MT SUSP: 5.0000 mL | Freq: Four times a day (QID) | OROMUCOSAL | Status: AC

## 2023-02-02 MED ORDER — ORAL CARE MOUTH RINSE: 15.0000 mL | Freq: Four times a day (QID) | OROMUCOSAL | Status: AC

## 2023-02-02 MED ORDER — LIDOCAINE 5 % EX PTCH: 2.0000 | MEDICATED_PATCH | CUTANEOUS | 0 refills | Status: DC

## 2023-02-02 MED ORDER — AMOXICILLIN 250 MG/5ML PO SUSR: 250.0000 mg | Freq: Three times a day (TID) | ORAL | 0 refills | Status: DC

## 2023-02-02 MED ORDER — SULFAMETHOXAZOLE-TRIMETHOPRIM 200-40 MG/5ML PO SUSP: 20.0000 mL | Freq: Two times a day (BID) | ORAL | 0 refills | Status: DC

## 2023-02-02 MED ORDER — HYDROCODONE-ACETAMINOPHEN 5-325 MG PO TABS: 1.0000 | ORAL_TABLET | Freq: Four times a day (QID) | ORAL | 0 refills | Status: DC | PRN

## 2023-02-02 NOTE — Progress Notes (Signed)
 Physical Therapy Treatment Patient Details Name: Jocelyn [name] MRN: [id number] DOB: [birth year] Today's Date: 02/02/2023   History of Present Illness Pt is an 89 y.o. female who presented to the ED 12/13 due to LUE weakness, left facial droop and CTH showed development of scattered SAH. 12/15 dislodged PEG tube. 12/16 incision and drainage of left abdominal wall infection PEG replaced. 12/19 additional I&D of abdominal wall performed. Recent hospitalization 11/27-12/11 due to R MCA ischemic stroke for which she underwent IR thrombectomy and PEG placement.  She was discharged to [facility] 12/11. PMH: HTN, CHF, a fib with RVR on eliquis , HLD, OSA    PT Comments  Pt supine in bed on arrival this session.  Pt pleasant this session and agreeable.  She remains fearful this session and pushes against facilitative movements.      If plan is discharge home, recommend the following: Two people to help with walking and/or transfers;Direct supervision/assist for medications management;Assist for transportation;Two people to help with bathing/dressing/bathroom;Assistance with cooking/housework;Assistance with feeding;Direct supervision/assist for financial management;Help with stairs or ramp for entrance;Supervision due to cognitive status   Can travel by private vehicle     No  Equipment Recommendations  None recommended by PT    Recommendations for Other Services       Precautions / Restrictions Precautions Precautions: Fall Precaution Comments: PEG tube, abd binder Restrictions Weight Bearing Restrictions Per Provider Order: No     Mobility  Bed Mobility Overal bed mobility: Needs Assistance Bed Mobility: Supine to Sit, Sit to Supine Rolling: Total assist, +2 for physical assistance Sidelying to sit: HOB elevated, Total assist, +2 for physical assistance       General bed mobility comments: pt fearful of falling with all movement; rolled to L and up from L side with pt utilizing RUE to assist  raise torso slightly; resists leaning to rt to return to bed    Transfers Overall transfer level: Needs assistance   Transfers: Bed to chair/wheelchair/BSC       Squat pivot transfers: Total assist, +2 safety/equipment     General transfer comment: Pt performed squat pivot from bed to recliner.  Presents with bout of BM and required rolling in recliner chair to clean patient and reapply purewick.  Pt fearful of falling and offers little to no assistance.    Ambulation/Gait                   Stairs             Wheelchair Mobility     Tilt Bed    Modified Rankin (Stroke Patients Only)       Balance Overall balance assessment: Needs assistance Sitting-balance support: Feet supported, Single extremity supported Sitting balance-Leahy Scale: Poor       Standing balance-Leahy Scale: Zero                              Cognition Arousal: Alert Behavior During Therapy: Flat affect Overall Cognitive Status: No family/caregiver present to determine baseline cognitive functioning                                          Exercises      General Comments        Pertinent Vitals/Pain Pain Assessment Pain Assessment: Faces Faces Pain Scale: No hurt Pain Location: bilateral knees Pain  Descriptors / Indicators: Grimacing, Discomfort Pain Intervention(s): Monitored during session, Repositioned    Home Living                          Prior Function            PT Goals (current goals can now be found in the care plan section) Acute Rehab PT Goals Patient Stated Goal: not stated Potential to Achieve Goals: Fair Progress towards PT goals: Progressing toward goals    Frequency    Min 1X/week      PT Plan      Co-evaluation              AM-PAC PT 6 Clicks Mobility   Outcome Measure  Help needed turning from your back to your side while in a flat bed without using bedrails?: Total Help needed  moving from lying on your back to sitting on the side of a flat bed without using bedrails?: Total Help needed moving to and from a bed to a chair (including a wheelchair)?: Total Help needed standing up from a chair using your arms (e.g., wheelchair or bedside chair)?: Total Help needed to walk in hospital room?: Total Help needed climbing 3-5 steps with a railing? : Total 6 Click Score: 6    End of Session Equipment Utilized During Treatment: Gait belt Activity Tolerance: Patient tolerated treatment well Patient left: with call bell/phone within reach;in bed Nurse Communication: Mobility status PT Visit Diagnosis: Other abnormalities of gait and mobility (R26.89);Hemiplegia and hemiparesis;Other symptoms and signs involving the nervous system (R29.898) Hemiplegia - Right/Left: Left Hemiplegia - dominant/non-dominant: Non-dominant Hemiplegia - caused by: Nontraumatic intracerebral hemorrhage     Time: 1350-1410 PT Time Calculation (min) (ACUTE ONLY): 20 min  Charges:    $Gait Training: 8-22 mins PT General Charges $$ ACUTE PT VISIT: 1 Visit                     Toya HAMS , PTA Acute Rehabilitation Services Office [phone]    Dondre Catalfamo JINNY Gosling 02/02/2023, 2:20 PM

## 2023-02-02 NOTE — Progress Notes (Signed)
 6 Days Post-Op    Subjective: Had some abdominal pain overnight. No n/v. Having bowel movements  Objective: Vital signs in last 24 hours: Temp:  [97.7 F (36.5 C)-99.3 F (37.4 C)] 99.3 F (37.4 C) (01/02 0756) Pulse Rate:  [90-108] 90 (01/02 0756) Resp:  [12-18] 14 (01/02 0756) BP: (117-160)/(65-95) 135/66 (01/02 0756) SpO2:  [98 %-100 %] 98 % (01/02 0756) Last BM Date : 02/02/23  Intake/Output from previous day: 01/01 0701 - 01/02 0700 In: 2023.3 [P.O.:300; NG/GT:1723.3] Out: 701 [Urine:700; Stool:1] Intake/Output this shift: No intake/output data recorded.  GI: G tube in place with Tfs running. I&D site clean with WTD. Just medial to G tube there is a palpable firm mass like structure. No fluctuance or overlying skin changes. Nontender to palpation.   Lab Results: CBC  Recent Labs    02/01/23 0654 02/02/23 0458  WBC 9.4 9.1  HGB 9.6* 9.5*  HCT 29.8* 29.5*  PLT 289 264   BMET Recent Labs    02/01/23 0654 02/02/23 0458  NA 134* 135  K 4.6 4.9  CL 100 102  CO2 25 25  GLUCOSE 128* 123*  BUN 16 14  CREATININE 0.43* 0.53  CALCIUM  9.1 9.1   PT/INR No results for input(s): LABPROT, INR in the last 72 hours. ABG No results for input(s): PHART, HCO3 in the last 72 hours.  Invalid input(s): PCO2, PO2  Studies/Results: No results found.  Anti-infectives: Anti-infectives (From admission, onward)    Start     Dose/Rate Route Frequency Ordered Stop   02/01/23 2200  amoxicillin  (AMOXIL ) chewable tablet 500 mg  Status:  Discontinued       Note to Pharmacy: Per tube or per mouth crushed per dysphagia diet.  thanks   500 mg Oral Every 8 hours 02/01/23 1328 02/01/23 1347   02/01/23 2200  sulfamethoxazole -trimethoprim  (BACTRIM  DS) 800-160 MG per tablet 1 tablet  Status:  Discontinued        1 tablet Oral Every 12 hours 02/01/23 1346 02/01/23 1347   02/01/23 2200  sulfamethoxazole -trimethoprim  (BACTRIM  DS) 800-160 MG per tablet 1 tablet  Status:   Discontinued        1 tablet Oral Every 12 hours 02/01/23 1347 02/01/23 1358   02/01/23 2200  amoxicillin  (AMOXIL ) chewable tablet 500 mg  Status:  Discontinued       Note to Pharmacy: Per tube or per mouth crushed per dysphagia diet.  thanks   500 mg Oral Every 8 hours 02/01/23 1347 02/01/23 1351   02/01/23 2200  amoxicillin -clavulanate (AUGMENTIN ) 250-62.5 MG/5ML suspension 500 mg  Status:  Discontinued        500 mg Per Tube Every 8 hours 02/01/23 1351 02/01/23 1400   02/01/23 2200  sulfamethoxazole -trimethoprim  (BACTRIM ) 200-40 MG/5ML suspension 20 mL  Status:  Discontinued        20 mL Per Tube Every 12 hours 02/01/23 1359 02/01/23 1401   02/01/23 2200  amoxicillin  (AMOXIL ) 250 MG/5ML suspension 250 mg        250 mg Oral Every 8 hours 02/01/23 1400     02/01/23 2200  sulfamethoxazole -trimethoprim  (BACTRIM ) 200-40 MG/5ML suspension 20 mL        20 mL Per Tube Every 12 hours 02/01/23 1401 02/06/23 0959   02/01/23 1430  sulfamethoxazole -trimethoprim  (BACTRIM  DS) 800-160 MG per tablet 1 tablet  Status:  Discontinued        1 tablet Oral Every 12 hours 02/01/23 1342 02/01/23 1346   01/25/23 0800  ciprofloxacin  (CIPRO ) tablet 500 mg  Status:  Discontinued        500 mg Oral 2 times daily 01/24/23 0850 01/24/23 2219   01/25/23 0000  azithromycin  (ZITHROMAX ) 500 mg in sodium chloride  0.9 % 250 mL IVPB  Status:  Discontinued        500 mg 250 mL/hr over 60 Minutes Intravenous Every 24 hours 01/24/23 2219 01/30/23 1255   01/24/23 2315  meropenem  (MERREM ) 1 g in sodium chloride  0.9 % 100 mL IVPB  Status:  Discontinued        1 g 200 mL/hr over 30 Minutes Intravenous Every 12 hours 01/24/23 2218 02/01/23 1328   01/17/23 1130  ceFEPIme  (MAXIPIME ) 2 g in sodium chloride  0.9 % 100 mL IVPB        2 g 200 mL/hr over 30 Minutes Intravenous Every 12 hours 01/17/23 1038 01/24/23 0355   01/16/23 0945  cefTRIAXone  (ROCEPHIN ) 2 g in sodium chloride  0.9 % 100 mL IVPB  Status:  Discontinued        2 g 200  mL/hr over 30 Minutes Intravenous Every 24 hours 01/16/23 0931 01/17/23 1038       Assessment/Plan: 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 Replaced by IR 12/16 after becoming dislodged, followed by bedside I&D x2. s/p operative debridement 12/27 by Dr. Ann. - Ok to continue using G tube for feeds and meds - Wound care: saline WTD dressings to LUQ wound BID. Improving. - Intra-op cultures show strep and enterobacter and she was on Merrem  for this. ID consulted and directing abx - Remainder of care per primary team  Stable for discharge from surgical standpoint with ongoing wound care with wet to dry dressing changes. I will arrange follow up in our clinic    LOS: 19 days   Jocelyn [name], [facility] 02/02/2023, 10:39 AM Please see Amion for pager number during day hours 7:00am-4:30pm   1/2/2025Patient ID: Jocelyn [name], female   DOB: [birth year], 89 y.o.   MRN: [id number]

## 2023-02-02 NOTE — Plan of Care (Signed)
  Problem: Clinical Measurements: Goal: Ability to maintain clinical measurements within normal limits will improve 02/02/2023 0532 by Gaetana Randall Mathew GORMAN, RN Outcome: Progressing 02/02/2023 0532 by Gaetana Randall Mathew GORMAN, RN Outcome: Progressing   Problem: Clinical Measurements: Goal: Will remain free from infection 02/02/2023 0532 by Gaetana Randall Mathew GORMAN, RN Outcome: Progressing 02/02/2023 0532 by Gaetana Randall Mathew GORMAN, RN Outcome: Progressing   Problem: Nutrition: Goal: Adequate nutrition will be maintained 02/02/2023 0532 by Gaetana Randall Mathew GORMAN, RN Outcome: Progressing 02/02/2023 0532 by Gaetana Randall Mathew GORMAN, RN Outcome: Progressing   Problem: Elimination: Goal: Will not experience complications related to bowel motility 02/02/2023 0532 by Gaetana Randall Mathew GORMAN, RN Outcome: Progressing 02/02/2023 0532 by Gaetana Randall Mathew GORMAN, RN Outcome: Progressing   Problem: Safety: Goal: Ability to remain free from injury will improve 02/02/2023 0532 by Gaetana Randall Mathew GORMAN, RN Outcome: Progressing 02/02/2023 0532 by Gaetana Randall Mathew GORMAN, RN Outcome: Progressing   Problem: Skin Integrity: Goal: Risk for impaired skin integrity will decrease 02/02/2023 0532 by Gaetana Randall Mathew GORMAN, RN Outcome: Progressing 02/02/2023 0532 by Gaetana Randall Mathew GORMAN, RN Outcome: Progressing   Problem: Pain Management: Goal: General experience of comfort will improve 02/02/2023 0532 by Gaetana Randall Mathew GORMAN, RN Outcome: Progressing 02/02/2023 0532 by Gaetana Randall Mathew GORMAN, RN Outcome: Progressing

## 2023-02-02 NOTE — TOC Progression Note (Signed)
 Transition of Care [facility]) - Progression Note    Patient Details  Name: Jocelyn [name] MRN: [id number] Date of Birth: [birth year]  [facility] CM/SW Contact  [name], [name] Phone Number: 02/02/2023, 4:19 PM  Clinical Narrative:   CSW updated by MD that patient medically stable for discharge today. CSW contacted daughter, Garen, to update about discharge and daughter refusing discharge today, that she has not spoken to a doctor and does not believe the patient is ready until she talks to a doctor. [facility] requesting that doctor see her face to face, she will be here to the hospital around 4:30-5. CSW relayed information to MD. CSW updated [facility] that patient will not be admitting today. CSW to follow.    Expected Discharge Plan: Skilled Nursing Facility Barriers to Discharge: Continued Medical Work up, Other (must enter comment)  Expected Discharge Plan and Services         Expected Discharge Date: 02/02/23                                     Social Determinants of Health (SDOH) Interventions SDOH Screenings   Food Insecurity: Patient Unable To Answer (01/14/2023)  Housing: Patient Unable To Answer (01/14/2023)  Transportation Needs: Unknown (01/14/2023)  Utilities: Patient Unable To Answer (01/14/2023)  Alcohol Screen: Low Risk  (12/03/2018)  Depression (PHQ2-9): Low Risk  (12/06/2022)  Financial Resource Strain: Medium Risk (07/26/2018)  Physical Activity: Inactive (07/26/2018)  Social Connections: Moderately Isolated (07/26/2018)  Stress: No Stress Concern Present (07/26/2018)  Tobacco Use: Low Risk  (01/27/2023)    Readmission Risk Interventions     No data to display

## 2023-02-02 NOTE — Progress Notes (Signed)
 Has been turned and repositioned in bed and was up in the chair for a couple hours.  Tolerated well but often repositions self back onto her left side while in the bed.  Prevalon boots on and is on an air mattress as well as turning and repositioning, per protocol.  Was going to be discharged but daughter Garen does not feel she is ready for discharge and wanted to have a face to face conversation with the doctor prior to her discharging, as has many concerns and questions for the doctor.  Dr did come up to see her at the time she said she would be here but she was unable to get here at that time.  I did page him when she got here and he said he would come up to see her before she leaves today.

## 2023-02-02 NOTE — Discharge Summary (Signed)
 Physician Discharge Summary  Jocelyn [name] FMW:[id number] DOB: 1931/07/28 DOA: 01/14/2023  PCP: Jocelyn Kern, PA-C  Admit date: 01/14/2023 Discharge date: 02/02/2023  Time spent: 60 minutes  Recommendations for Outpatient Follow-up:  Follow-up with MD at skilled nursing facility.  Patient will need a comprehensive metabolic profile, CBC done in 1 week to follow-up on electrolytes and renal function and LFTs.  If further blood pressure control is needed may consider resuming patient back on prior dose of Avapro  150 mg daily and possibly spironolactone . Follow-up with Jocelyn Maczis, PA General Surgery. Follow-up with speech therapy at facility. Follow-up with [facility] in 4 weeks.   Discharge Diagnoses:  Principal Problem:   ICH (intracerebral hemorrhage) (HCC) Active Problems:   Hyperlipidemia   Malnutrition of moderate degree   Abdominal wall abscess   Bacteremia due to Enterobacter species   Aspiration pneumonia (HCC)   Dysphagia   Oral candidiasis   Transaminitis   Chronic a-fib (HCC)   Pressure injury of skin   Discharge Condition: Stable and improved.  Diet recommendation: Dysphagia 2 diet with thin liquids as well as ongoing tube feeds.  Filed Weights   01/19/23 1300 01/21/23 0500 01/26/23 1400  Weight: 67.9 kg 69 kg 67.8 kg    History of present illness:  HPI per Dr. Merrianne [name] Jocelyn [name] is an 89 y.o. female  has a past medical history of Arthritis of both knees (08/01/2015), Colitis, Essential hypertension, benign (07/28/2014), GERD (gastroesophageal reflux disease), iron deficiency anemia (09/02/2015), Hyperlipidemia, Hypertension, Lumbar pain, Medicare annual wellness visit, subsequent (04/07/2017), OSA on CPAP (08/01/2015), Overweight (BMI 25.0-29.9) (07/10/2013), Prediabetes (09/02/2015), and SOB (shortness of breath) (07/10/2013)., atrial fibrillation (on Eliquis ) who was recently discharged from the Stroke service at [facility] on 12/11 after management  of an acute right MCA territory infarct in the setting of atrial fibrillation. She had presented at that time with left-sided weakness and confusion. Imaging had revealed a right A1 and M2 occlusion and she underwent successful mechanical thrombectomy, with TICI 3 revascularization. which was successful.   Follow up MRI after VIR, showed scattered acute right MCA distribution infarcts involving the right caudate and lentiform nuclei, right insula, and overlying right cerebral hemisphere, small volume petechial hemorrhage at the posterior right frontal region. MRA showed interval revascularization of previously seen clot at the right ICA terminus and right M2 segment; focal moderate distal right P2 stenosis. 2D Echo showed EF 60 to 65%, LV severe concentric hypertrophy, LA severely dilated.   Her Eliquis  was restarted on 12/10 for her chronic A-fib. She was initially unable to pass her swallow study, and PEG tube was inserted on 12/9 with tube feeding started, although patient did pass swallow study afterwards and was beginning to take foods by mouth at the time of her discharge. She did have some pain in her left knee, which was successfully treated with lidocaine  patches. She was discharged to a SNF.   She re-presented to the ED at [facility] on Friday from [facility] rehab facility for acute stroke symptoms. LKN was 1830. She had been at [facility] for LUE weakness. Per EMS she had new onset L side facial droop, numbness to L arm, and significantly increased weakness to L arm. CTH showed interval development of scattered subarachnoid hemorrhage in the right sylvian fissure. Teleneurology was consulted. NIHSS was 17. Teleneurology advised DOAC reversal with KCentra , BP management, EEG for observed facial twitching and admission as well as Neurosurgery consult. Neurosurgery recommended Keppra  500 mg. CTA showed no LVO; atheromatous plaque  was again noted. 3-4 mm outpouchings extending posteriorly and inferiorly from  both supraclinoid ICAs were noted, which Radiology felt could reflect small aneurysms versus vascular infundibula; the outpouchings were similar in appearance compared to recent MRA from 12/30/2022.   She has been transferred to the [facility] Neuro ICU for further management.        Last known well: 1830 Modified rankin score: 4-Needs assistance to walk and tend to bodily needs ICH Score: 1 NIHSS: 17     Hospital Course:  Subarachnoid hemorrhage - possible cerebral aneurysm Noted at time of this readmission - occurred while on Eliquis  therapy - imaging has suggested outpouchings extending posteriorly and inferiorly from both supraclinoid ICAs which could reflect small aneurysms - these were noted on CTa as well as MRa - yearly MRa is recommended in follow-up by the Stroke Team. Per Neurology note, now not on antithrombotic. -Outpatient follow-up with neurology.   Recent CVA Admitted to the hospital 11/28-12/12/2022 for treatment of a right MCA territory infarct felt to be cardioembolic due to A-fib - an acute clot was noted at the right terminal ICA and the patient underwent intervention with subsequent revascularization   Abdominal wall abscess General Surgery has been following with I&D performed 12/16 with Penrose drain placement and again 12/19 with placement of a second Penrose drain -wound care per General Surgery - IV antibiotic therapy resumed.  -pt taken to OR 12/27 for I/D. WBC normalized. thus far Enterobacter cloacae and Streptococcus aginosis noted out of cultures. -ID consulted for antibiotic duration and recommendations and antibiotics changed to oral Bactrim  and amoxicillin  through January 5 per ID recommendations. -General Surgery following and cleared patient for discharge with close outpatient follow-up.    Enterobacter bacteremia Noted on 1 of 2 blood cultures drawn 12/15 - third-generation cephalosporin and Zosyn resistant - had earlier completed 7 full days of cefepime   therapy -Repeat blood cx neg.   Aspiration pneumonia Patient initially was on azithromycin  which was subsequently discontinued as patient was placed on meropenem  for treatment of abdominal wall abscess.  -Patient also followed by speech therapy was placed on dysphagia 1 diet and upgrade to a dysphagia 2 diet which she was tolerating.   -Patient placed on aspiration precautions.   -Patient improved clinically, remained afebrile, oxygen sats noted to be 96 to 98% on room air by day of discharge.    Oral candidiasis Patient maintained on nystatin  swish/swallow and will be discharged on 7 more days of nystatin  swish and swallow.   Dysphagia due to stroke PEG tube originally placed 12/9 during prior admission - this became dislodged 12/15 with the tract temporarily preserved using a Foley catheter which was subsequently exchanged for a replacement PEG tube in IR 12/16 -Re-evaluated by SLP 12/26 given concerns of aspiration. Was cleared for dysphagia 1 diet initially and diet advanced to dysphagia 2 diet. -Patient maintained on tube feeds which she will be discharged on in addition to dysphagia 2 diet. -Outpatient follow-up with speech therapy at facility..   Abnormal EEG Routine EEG 12/14 raised the question of cortical dysfunction arising from the right hemisphere but with no evidence of seizure activity - long-term EEG revealed no evidence of active seizure activity. -Patient was seen in consultation by teleneurology.   HTN -Patient maintained on Norvasc  during the hospitalization with good blood pressure control.   -Patient is Avapro , HCTZ and spironolactone  were held and will be discontinued on discharge.   -On follow-up for further blood pressure control is needed may consider resuming  patient is Avapro  and spironolactone .     Chronic atrial fibrillation -Rate controlled.   -Eliquis  discontinued due to [facility] - rate controlled   Transaminitis Statin on hold due to transaminitis which  improved during the hospitalization and had resolved by day of discharge.   -Statin was held due to elevated transaminitis and will be resumed on discharge.   -Outpatient follow-up.    HLD LDL 33 - statin discontinued 12/17 due to elevated LFTs -Transaminitis improved and had resolved by day of discharge. -Statin will be resumed on discharge. -Outpatient follow-up.   Pressure injury, Pressure Injury 01/21/23 Sacrum Stage 2 -  Partial thickness loss of dermis presenting as a shallow open injury with a red, pink wound bed without slough. red open sore (Active)  01/21/23 0800  Location: Sacrum  Location Orientation:   Staging: Stage 2 -  Partial thickness loss of dermis presenting as a shallow open injury with a red, pink wound bed without slough.  Wound Description (Comments): red open sore  Present on Admission: No     Pressure Injury 01/21/23 Heel Left;Posterior Deep Tissue Pressure Injury - Purple or maroon localized area of discolored intact skin or blood-filled blister due to damage of underlying soft tissue from pressure and/or shear. purplish area (Active)  01/21/23 2000  Location: Heel  Location Orientation: Left;Posterior  Staging: Deep Tissue Pressure Injury - Purple or maroon localized area of discolored intact skin or blood-filled blister due to damage of underlying soft tissue from pressure and/or shear.  Wound Description (Comments): purplish area  Present on Admission: -- (unknown, no documentation found)           Procedures: I&D abdominal wall abscess per general surgery: Dr. Dann Hummer 01/19/2023 EEG 01/15/2023 Irrigation and debridement abdominal wall abscess per general surgery: Dr. Ann 01/27/2023 CT abdomen and pelvis 01/15/2023, 01/16/2023    Consultations: General Surgery: Dr. Ann 01/15/2023 Wound care RN Stephane Fought, RN 01/23/2023 Teleneurology: Dr.Mehta 01/13/2023 ID: Dr. Efrain 02/01/2023  Discharge Exam: Vitals:   02/02/23 1101 02/02/23  1548  BP: 132/70 94/73  Pulse: 98 93  Resp: 14 14  Temp: 98.9 F (37.2 C) 98.2 F (36.8 C)  SpO2: 98% 96%    General: NAD Cardiovascular: Irregularly irregular. Respiratory: Clear to auscultation bilaterally anterior lung fields.  No wheezes, no crackles, no rhonchi.  Fair air movement.  Speaking in full sentences.  Discharge Instructions   Discharge Instructions     Ambulatory referral to Neurology   Complete by: As directed    An appointment is requested in approximately: 4 weeks   Diet - low sodium heart healthy   Complete by: As directed    Dysphagia 2 diet with thin liquids   Discharge wound care:   Complete by: As directed    Pressure Injury 01/21/23 Sacrum Stage 2 -  Partial thickness loss of dermis presenting as a shallow open injury with a red, pink wound bed without slough. red open sore 12 days    Pressure Injury 01/21/23 Heel Left;Posterior Deep Tissue Pressure Injury - Purple or maroon localized area of discolored intact skin or blood-filled blister due to damage of underlying soft tissue from pressure and/or shear. purplish area 11 days      Wound Care Orders (From admission, onward)      Start     Ordered   01/27/23 1800    Wound care  2 times daily      Comments: Gen Surgery team will be by to do first dressing change  w/ nursing staff on 12/28 AM. After this, please cleanse with NS, pat gently dry. Fill defects with saline moistened roll gauze, top with dry gauze, ABD pads and secure with tape. Change PRN for soiling, otherwise twice daily.  01/27/23 1512   01/23/23 0843    Foam dressing  Until discontinued      Comments: Foam dressing to sacrum, change Q 3 days or PRN soiling  01/23/23 0843   01/20/23 0500    Wound care  Daily      Comments: Dry dressing over abdominal incisions/penrose drains. Penrose x2 to remain in place. Change daily and PRN saturation.   Increase activity slowly   Complete by: As directed       Allergies as of 02/02/2023   No  Known Allergies      Medication List     STOP taking these medications    apixaban  5 MG Tabs tablet Commonly known as: ELIQUIS    hydrochlorothiazide  12.5 MG tablet Commonly known as: HYDRODIURIL    irbesartan  150 MG tablet Commonly known as: AVAPRO    spironolactone  25 MG tablet Commonly known as: ALDACTONE        TAKE these medications    acetaminophen  325 MG tablet Commonly known as: TYLENOL  Take 2 tablets (650 mg total) by mouth every 4 (four) hours as needed for mild pain (pain score 1-3) (or temp > 37.5 C (99.5 F)). What changed: how to take this   amLODipine  10 MG tablet Commonly known as: NORVASC  Place 1 tablet (10 mg total) into feeding tube daily.   amoxicillin  250 MG/5ML suspension Commonly known as: AMOXIL  Place 5 mLs (250 mg total) into feeding tube every 8 (eight) hours for 4 days.   ascorbic acid  250 MG tablet Commonly known as: VITAMIN C  Place 1 tablet (250 mg total) into feeding tube daily. Start taking on: February 03, 2023   atorvastatin  40 MG tablet Commonly known as: LIPITOR Place 1 tablet (40 mg total) into feeding tube daily.   calcium  citrate-vitamin D  500-400 MG-UNIT chewable tablet Place 1 tablet into feeding tube 2 (two) times daily.   famotidine  20 MG tablet Commonly known as: PEPCID  Place 1 tablet (20 mg total) into feeding tube daily.   feeding supplement (OSMOLITE 1.2 CAL) Liqd Place 1,000 mLs into feeding tube continuous.   ferrous sulfate  300 (60 Fe) MG/5ML syrup Place 325 mg into feeding tube daily.   fiber supplement (BANATROL TF) liquid Place 60 mLs into feeding tube 2 (two) times daily.   free water  Soln Place 100 mLs into feeding tube every 4 (four) hours.   HYDROcodone -acetaminophen  5-325 MG tablet Commonly known as: NORCO/VICODIN Take 1 tablet by mouth every 6 (six) hours as needed for moderate pain (pain score 4-6).   ipratropium-albuterol  0.5-2.5 (3) MG/3ML Soln Commonly known as: DUONEB Take 3 mLs by  nebulization every 6 (six) hours as needed.   lidocaine  5 % Commonly known as: LIDODERM  Place 2 patches onto the skin daily. Remove & Discard patch within 12 hours or as directed by MD   liver oil-zinc  oxide 40 % ointment Commonly known as: DESITIN Apply topically 3 (three) times daily. Apply to perianal region, buttock, groin after each stool   mouth rinse Liqd solution 15 mLs by Mouth Rinse route 4 (four) times daily for 14 days. Suction Debris from the mouth. Brush teeth with a suction toothbrush with antiseptic oral rinse. Apply moisturizer in the mouth and on the lips. This is NOT a pharmacy stocked item. Obtain from [facility].   mouth rinse Liqd solution 15 mLs by Mouth Rinse route as needed (oral care).   multivitamin Liqd Place 15 mLs into feeding tube daily. Start taking on: February 03, 2023   nystatin  100000 UNIT/ML suspension Commonly known as: MYCOSTATIN  Take 5 mLs (500,000 Units total) by mouth 4 (four) times daily for 7 days.   sulfamethoxazole -trimethoprim  200-40 MG/5ML suspension Commonly known as: BACTRIM  Place 20 mLs into feeding tube every 12 (twelve) hours for 4 days.   white petrolatum  Oint Commonly known as: VASELINE Apply 1 Application topically as needed for lip care.               Discharge Care Instructions  (From admission, onward)           Start     Ordered   02/02/23 0000  Discharge wound care:       Comments: Pressure Injury 01/21/23 Sacrum Stage 2 -  Partial thickness loss of dermis presenting as a shallow open injury with a red, pink wound bed without slough. red open sore 12 days    Pressure Injury 01/21/23 Heel Left;Posterior Deep Tissue Pressure Injury - Purple or maroon localized area of discolored intact skin or blood-filled blister due to damage of underlying soft tissue from pressure and/or shear. purplish area 11 days      Wound Care Orders (From admission, onward)      Start     Ordered   01/27/23 1800    Wound  care  2 times daily      Comments: Gen Surgery team will be by to do first dressing change w/ nursing staff on 12/28 AM. After this, please cleanse with NS, pat gently dry. Fill defects with saline moistened roll gauze, top with dry gauze, ABD pads and secure with tape. Change PRN for soiling, otherwise twice daily.  01/27/23 1512   01/23/23 0843    Foam dressing  Until discontinued      Comments: Foam dressing to sacrum, change Q 3 days or PRN soiling  01/23/23 0843   01/20/23 0500    Wound care  Daily      Comments: Dry dressing over abdominal incisions/penrose drains. Penrose x2 to remain in place. Change daily and PRN saturation.   02/02/23 1548           No Known Allergies  Follow-up Information     [name], Jocelyn Gosai, PA-C. Call.   Specialty: General Surgery Why: We are making a follow up appointment for you., Please call to confirm appointment time., Arrive 30 minutes early to complete check in, and bring photo ID and insurance card. Contact information: [address] [facility] [name] [address] [phone]         MD AT SNF Follow up.          [facility] Follow up in 4 week(s).   Specialty: Neurology Contact information: [address] [place]  [address] [phone]        slp Follow up.                   The results of significant diagnostics from this hospitalization (including imaging, microbiology, ancillary and laboratory) are listed below for reference.    Significant Diagnostic Studies: DG CHEST PORT 1 VIEW Result Date: 01/25/2023 CLINICAL DATA:  Aspiration into airway EXAM: PORTABLE CHEST 1 VIEW COMPARISON:  01/24/2023 FINDINGS: Shallow inspiration. Cardiac enlargement. Calcification in the mitral valve annulus and  aorta. Patchy infiltrates in the left mid and lower lung zone, similar to prior study. This is likely pneumonia, possibly  aspiration in the appropriate clinical setting. Right lung is clear. No pleural effusions. No pneumothorax. Calcification of the aorta. Degenerative changes in the spine and shoulders. IMPRESSION: Persistent infiltration in the left mid and lower lung, similar to prior study. Cardiac enlargement. Electronically Signed   By: Elsie Gravely M.D.   On: 01/25/2023 19:55   DG CHEST PORT 1 VIEW Result Date: 01/24/2023 CLINICAL DATA:  CHF EXAM: PORTABLE CHEST 1 VIEW COMPARISON:  01/14/2023 FINDINGS: Heart and mediastinal contours within normal limits. Aortic atherosclerosis. Patchy airspace disease throughout the left lower lung. No confluent opacity on the right. No effusions or acute bony abnormality. IMPRESSION: Patchy left lower lung airspace disease concerning for pneumonia. Electronically Signed   By: Franky Crease M.D.   On: 01/24/2023 21:23   DG ABDOMEN PEG TUBE LOCATION Result Date: 01/18/2023 CLINICAL DATA:  Leaking PEG tube. Gastrostomy tube replacement 2 days ago. EXAM: ABDOMEN - 1 VIEW COMPARISON:  Radiographs 01/15/2023 and 12/30/2022.  CT 01/16/2023. FINDINGS: 1159 hours. Single-view of the mid to lower abdomen was obtained after contrast (30 cc Gastrografin ) was injected via the PEG tube. The contrast is within the lumen of the stomach. No extravasated enteric contrast identified. There is contrast material within the rectosigmoid colon from previous administration. There is a nonobstructive bowel gas pattern. Soft tissue emphysema within the anterior abdominal wall appears improved from recent CT. Foreign body overlying the left lateral abdominal wall is potentially a surgical drain or sponge. Correlate clinically. IMPRESSION: 1. No evidence of extravasated enteric contrast. Tip of the G-tube is within the gastric lumen. 2. Nonobstructive bowel gas pattern. 3. Improved soft tissue emphysema within the anterior abdominal wall. Possible surgical drain or sponge overlying the left lateral abdominal  wall. Correlate clinically. Electronically Signed   By: Elsie Perone M.D.   On: 01/18/2023 13:48   IR REPLACE GASTRO/JEJUNO TUBE PERC W/FLUORO Result Date: 01/16/2023 INDICATION: 89 year old female with surgically placed G-tube which was inadvertently displaced. Currently, a 10 French Foley catheter is maintaining the tract. She presents for gastric tube replacement. EXAM: Fluoroscopically guided placement of percutaneous pull-through gastrostomy tube Interventional Radiologist:  Wilkie LOIS Lent, MD MEDICATIONS: None. ANESTHESIA/SEDATION: None. CONTRAST:  15mL OMNIPAQUE  IOHEXOL  300 MG/ML  SOLN FLUOROSCOPY: Radiation exposure index: 5 mGy reference air kerma COMPLICATIONS: None immediate. PROCEDURE: Informed written consent was obtained from the patient after a thorough discussion of the procedural risks, benefits and alternatives. All questions were addressed. Maximal Sterile Barrier Technique was utilized including caps, mask, sterile gowns, sterile gloves, sterile drape, hand hygiene and skin antiseptic. A timeout was performed prior to the initiation of the procedure. Maximal barrier sterile technique utilized including caps, mask, sterile gowns, sterile gloves, large sterile drape, hand hygiene, and chlorhexadine skin prep. A gentle hand injection of contrast material through the Foley catheter was performed confirming the catheter is indeed within the gastric lumen. The catheter was removed over a wire. An Amplatz wire was advanced into the gastric lumen. The percutaneous tract was dilated to 20 French. A 20 French balloon retention gastrostomy tube was advanced over the wire and into the stomach. The retention balloon was filled with 15 mL saline and pulled snug against the anterior abdominal wall. The external bumper was fixed in place. Contrast injection was again performed confirming the tube is within the stomach. The tube was then flushed with saline. IMPRESSION: Successful exchange for a 20  French balloon retention gastrostomy tube. Electronically Signed   By: Wilkie Lent M.D.   On: 01/16/2023 18:31   CT ABDOMEN WO CONTRAST Result Date: 01/16/2023 CLINICAL DATA:  Peg tube placement. EXAM: CT ABDOMEN WITHOUT CONTRAST TECHNIQUE: Multidetector CT imaging of the abdomen was performed following the standard protocol without IV contrast. RADIATION DOSE REDUCTION: This exam was performed according to the departmental dose-optimization program which includes automated exposure control, adjustment of the mA and/or kV according to patient size and/or use of iterative reconstruction technique. COMPARISON:  CT abdomen 01/16/2023 FINDINGS: Lower chest: There is atelectasis in the left lung base. The heart is enlarged. Hepatobiliary: Gallbladder is surgically absent. There is no biliary ductal dilatation. There is a 3.1 cm cyst in the inferior right liver which is unchanged. Pancreas: Unremarkable. No pancreatic ductal dilatation or surrounding inflammatory changes. Spleen: Normal in size without focal abnormality. Adrenals/Urinary Tract: There is residual contrast in the bilateral renal collecting systems. No hydronephrosis or perinephric fluid. Adrenal glands are within normal limits. Stomach/Bowel: Again seen is a percutaneous gastrostomy tube in place in the body of the stomach. Contrast is seen within nondilated stomach. There is a tract of contrast extravasation within the subcutaneous tissues left of the catheter which appears unchanged from prior. There is a small hiatal hernia. No dilated bowel loops are seen. Vascular/Lymphatic: Aortic atherosclerosis. No enlarged abdominal lymph nodes. Other: Extensive anterior abdominal wall subcutaneous emphysema again noted. No focal fluid collections or abdominal wall hernia. Musculoskeletal: Degenerative changes affect the spine. IMPRESSION: 1. Percutaneous gastrostomy tube in place in the body of the stomach. There is a tract of contrast extravasation  within the subcutaneous tissues left of the catheter which appears unchanged from prior. 2. Extensive anterior abdominal wall subcutaneous emphysema again noted. Aortic Atherosclerosis (ICD10-I70.0). Electronically Signed   By: Greig Pique M.D.   On: 01/16/2023 00:41   CT ABDOMEN WO CONTRAST Result Date: 01/16/2023 CLINICAL DATA:  Status post percutaneous gastrostomy placement. Abdominal pain. EXAM: CT ABDOMEN WITHOUT CONTRAST TECHNIQUE: Multidetector CT imaging of the abdomen was performed following the standard protocol without IV contrast. RADIATION DOSE REDUCTION: This exam was performed according to the departmental dose-optimization program which includes automated exposure control, adjustment of the mA and/or kV according to patient size and/or use of iterative reconstruction technique. COMPARISON:  01/15/2023 FINDINGS: Lower chest: Cardiomegaly. Extensive calcification of the mitral valve annulus. Right coronary artery calcification. Moderate hiatal hernia. Eventration of the left posterior hemidiaphragm. Mild left basilar atelectasis. Hepatobiliary: Simple cyst within the inferior right hepatic lobe. Liver otherwise unremarkable this noncontrast examination. Status post cholecystectomy. Mild extrahepatic biliary ductal dilation is stable likely representing post cholecystectomy change Pancreas: Unremarkable Spleen: Unremarkable Adrenals/Urinary Tract: The adrenal glands are unremarkable. The kidneys are normal in size and position. Excreted contrast within the renal collecting system bilaterally. No hydronephrosis. Stomach/Bowel: The previously noted button type pertain is gastrostomy has been removed and replaced with a small bore catheter which extends only into the subcutaneous fat of the anterior abdominal wall along the gastrostomy tract. Injected contrast opacifies a tortuous and irregular tract which extends to the gastric lumen as well as extravasates laterally to the left within subcutaneous  fat of the anterior abdominal wall in keeping with a partially disrupted or immature, but persistent tract. There is no intraperitoneal extension of contrast. Extensive subcutaneous gas is seen with anterior abdominal wall, similar prior examination. The visualized stomach, small bowel, and large bowel are otherwise unremarkable save for moderate distal colonic diverticulosis, partially visualized. No free  intraperitoneal gas. Vascular/Lymphatic: Extensive aortoiliac atherosclerotic calcification. Reviewed prominent atherosclerotic calcification within the proximal superior mesenteric artery and celiac axis again noted, likely resulting in hemodynamically significant stenoses. No pathologic adenopathy. Other: None significant Musculoskeletal: Advanced degenerative changes are seen within the visualized thoracolumbar spine with superimposed levoscoliosis. No acute bone abnormality. IMPRESSION: 1. Interval removal of previously noted button type gastrostomy and placement of a small bore catheter which extends only into the subcutaneous fat of the anterior abdominal wall along the gastrostomy tract. Injected contrast opacifies a tortuous and irregular tract which extends to the gastric lumen as well as extravasates laterally to the left within subcutaneous fat of the anterior abdominal wall in keeping with a partially disrupted or immature, but persistent tract. No intraperitoneal extension of contrast. No free intraperitoneal gas. 2. Stable extensive subcutaneous gas within the anterior abdominal wall of 3. Cardiomegaly. Right coronary artery calcification. 4. Extensive atherosclerotic calcification within the proximal superior mesenteric artery and celiac axis again noted, likely resulting in hemodynamically significant stenoses. If there is clinical evidence of chronic mesenteric ischemia, this would be better assessed with CT arteriography. 5. Moderate distal colonic diverticulosis, partially visualized. Aortic  Atherosclerosis (ICD10-I70.0). Electronically Signed   By: Dorethia Molt M.D.   On: 01/16/2023 00:30   DG ABDOMEN PEG TUBE LOCATION Result Date: 01/16/2023 CLINICAL DATA:  Peg tube adjustment EXAM: ABDOMEN - 1 VIEW COMPARISON:  CT 01/15/2023 FINDINGS: Contrast opacifies the stomach. There is enteral contrast within the bowel as was noted on previous CT. Large amount of subcutaneous emphysema limits the exam. No frank extravasation is seen. IMPRESSION: Large amount of soft tissue emphysema as well as presence of pre-existing contrast limits the exam. The gastrostomy balloon is poorly visible but injected contrast opacifies the stomach. Difficult to exclude contrast within gastrostomy tract given appearance on prior CT versus small volume extravasated contrast on the subsequent image obtained after flushing. Repeat limited abdominal CT could be obtained to reassess tube position given the described limitations. Electronically Signed   By: Luke Bun M.D.   On: 01/16/2023 00:07   CT ABDOMEN PELVIS W CONTRAST Addendum Date: 01/15/2023 ADDENDUM REPORT: 01/15/2023 23:44 ADDENDUM: Additional finding. Slightly dense mass or enlargement of the left iliacus muscle compared to the right measuring 6.9 x 3.5 cm. This could reflect an intramuscular hematoma. Correlate for any history of trauma, and attention on follow-up imaging. Electronically Signed   By: Luke Bun M.D.   On: 01/15/2023 23:44   Result Date: 01/15/2023 CLINICAL DATA:  Nonlocalized abdomen pain EXAM: CT ABDOMEN AND PELVIS WITH CONTRAST TECHNIQUE: Multidetector CT imaging of the abdomen and pelvis was performed using the standard protocol following bolus administration of intravenous contrast. RADIATION DOSE REDUCTION: This exam was performed according to the departmental dose-optimization program which includes automated exposure control, adjustment of the mA and/or kV according to patient size and/or use of iterative reconstruction technique.  CONTRAST:  75mL OMNIPAQUE  IOHEXOL  350 MG/ML SOLN COMPARISON:  None Available. FINDINGS: Lower chest: Lung bases demonstrate mild consolidation in the left base. Cardiomegaly with coronary vascular calcification. Mitral calcification. Small hiatal hernia Hepatobiliary: Cholecystectomy. No focal hepatic abnormality. 12 mm common bile duct likely due to postsurgical change. Pancreas: Unremarkable. No pancreatic ductal dilatation or surrounding inflammatory changes. Spleen: Normal in size without focal abnormality. Adrenals/Urinary Tract: Adrenal glands are within normal limits. Kidneys show no hydronephrosis. The bladder is unremarkable. Stomach/Bowel: Gastrostomy tube balloon is position within the subcutaneous soft tissues of the anterior abdominal wall with gas in the gastrostomy tube tract  leading to the stomach. Large volume subcutaneous emphysema within the anterior abdominal wall. Enteral contrast otherwise within small and large bowel. No bowel obstruction or bowel wall thickening. Diverticular disease of the sigmoid colon. Vascular/Lymphatic: Advanced aortic atherosclerosis. No aneurysm. No suspicious lymph nodes. Reproductive: Hysterectomy. No adnexal mass. Some contrast within the vaginal cuff Other: Negative for pelvic effusion or free air. Musculoskeletal: Scoliosis and degenerative changes of the spine. No acute osseous abnormality. IMPRESSION: 1. Malposition percutaneous gastrostomy tube with balloon visualized within the subcutaneous soft tissues of the anterior abdominal wall and contiguous with gas-filled gastrostomy tube tract extending to the stomach. Large volume subcutaneous emphysema within the anterior abdominal wall. 2. No evidence for a bowel obstruction, enteral contrast reaches the rectum. There is some contrast present within the vaginal cuff, but no directly visualized enterovaginal fistula is identified. 3. Sigmoid colon diverticular disease without acute inflammatory process. These  results will be called to the ordering clinician or representative by the Radiologist Assistant, and communication documented in the PACS or [employer]. Electronically Signed: By: Luke Bun M.D. On: 01/15/2023 23:11   CT HEAD WO CONTRAST ( ) Result Date: 01/15/2023 CLINICAL DATA:  Follow-up examination for stroke. EXAM: CT HEAD WITHOUT CONTRAST TECHNIQUE: Contiguous axial images were obtained from the base of the skull through the vertex without intravenous contrast. RADIATION DOSE REDUCTION: This exam was performed according to the departmental dose-optimization program which includes automated exposure control, adjustment of the mA and/or kV according to patient size and/or use of iterative reconstruction technique. COMPARISON:  Prior studies from 01/13/2023 FINDINGS: Brain: Age-related cerebral atrophy with chronic small vessel ischemic disease. Evolving subacute right MCA distribution infarct again noted, grossly similar to prior. Presumed hemorrhagic transformation with small volume subarachnoid hemorrhage within the right sylvian fissure, decreased from prior. No new intracranial hemorrhage. No other acute large vessel territory infarct. No mass lesion or midline shift. No hydrocephalus or extra-axial fluid collection. Vascular: No abnormal hyperdense vessel. Calcified atherosclerosis present at the skull base. Skull: Scalp soft tissues and calvarium demonstrate no new finding. Sinuses/Orbits: Globes and orbital soft tissues within normal limits. Paranasal sinuses and mastoid air cells are largely clear. Other: None. IMPRESSION: 1. Evolving subacute right MCA distribution infarct, grossly similar to prior. Presumed hemorrhagic transformation with small volume subarachnoid hemorrhage within the right Sylvian fissure, decreased from prior. 2. No other new acute intracranial abnormality. Electronically Signed   By: Morene Hoard M.D.   On: 01/15/2023 23:10   Overnight EEG with  video Result Date: 01/15/2023 Shel[place] Arlin KIDD, MD     01/15/2023  3:46 PM Patient Name: Jocelyn [name] MRN: [id number] Epilepsy Attending: Arlin KIDD Shelton Referring Physician/Provider: Nichola Zeke HERO, MD Duration: 01/14/2023 1104 to 01/15/2023 1104  Patient history:  89 y.o. female who re-presents with right parietotemporal predominantly subarachnoid hemorrhage with a small parenchymal component at the location of one of her recent strokes, while on Eliquis . Discharged on 12/11 after admission for right hemisphere strokes. EEG to evaluate for seizure  Level of alertness: Awake, asleep  AEDs during EEG study: None  Technical aspects: This EEG study was done with scalp electrodes positioned according to the 10-20 International system of electrode placement. Electrical activity was reviewed with band pass filter of 1-70Hz , sensitivity of 7 uV/mm, display speed of 81mm/sec with a 60Hz  notched filter applied as appropriate. EEG data were recorded continuously and digitally stored.  Video monitoring was available and reviewed as appropriate.  Description: The posterior dominant rhythm consists of 7.5 Hz activity of  moderate voltage (25-35 uV) seen predominantly in posterior head regions, symmetric and reactive to eye opening and eye closing. Sleep was characterized by vertex waves, sleep spindles (12 to 14 Hz), maximal frontocentral region. EEG showed continuous generalized and lateralized right hemisphere 3 to 6 Hz theta-delta slowing. Generalized and maximal posterior quadrant sharp transients with triphasic morphology were noted intermittently. Hyperventilation and photic stimulation were not performed.    ABNORMALITY - Continuous slow, generalized and lateralized right hemisphere  IMPRESSION: This study is suggestive of cortical dysfunction arising from right hemisphere likely secondary to underlying structural abnormality. Additionally there is moderate diffuse encephalopathy. No seizures were seen  throughout the recording.  Generalized sharp transients noted during routine eeg do not appear to be epileptic in nature. Arlin MALVA Krebs  DG CHEST PORT 1 VIEW Result Date: 01/14/2023 CLINICAL DATA:  [id number] Respiratory abnormalities [id number] EXAM: PORTABLE CHEST 1 VIEW COMPARISON:  01/09/2023 chest radiograph. FINDINGS: The right rotated chest radiograph. Stable cardiomediastinal silhouette with mild cardiomegaly. No pneumothorax. No pleural effusion. No pulmonary edema. No consolidative airspace disease. Previously described faint upper right lung opacities are not discretely visualized on today's radiograph. IMPRESSION: 1. Mild cardiomegaly. No pulmonary edema. 2. Previously described faint upper right lung opacities are not discretely visualized on today's radiograph. Electronically Signed   By: Selinda DELENA Blue M.D.   On: 01/14/2023 16:36   EEG adult Result Date: 01/14/2023 Krebs Arlin MALVA, MD     01/14/2023 11:00 AM Patient Name: MAXI CARRERAS MRN: [id number] Epilepsy Attending: Arlin MALVA Krebs Referring Physician/Provider: Remi Pippin, NP Date: 01/14/2023 Duration: 23.35 mins Patient history:  89 y.o. female who re-presents with right parietotemporal predominantly subarachnoid hemorrhage with a small parenchymal component at the location of one of her recent strokes, while on Eliquis . Discharged on 12/11 after admission for right hemisphere strokes. EEG to evaluate for seizure Level of alertness: Awake, asleep AEDs during EEG study: None Technical aspects: This EEG study was done with scalp electrodes positioned according to the 10-20 International system of electrode placement. Electrical activity was reviewed with band pass filter of 1-70Hz , sensitivity of 7 uV/mm, display speed of 47mm/sec with a 60Hz  notched filter applied as appropriate. EEG data were recorded continuously and digitally stored.  Video monitoring was available and reviewed as appropriate. Description: The posterior dominant  rhythm consists of 7.5 Hz activity of moderate voltage (25-35 uV) seen predominantly in posterior head regions, symmetric and reactive to eye opening and eye closing. Sleep was characterized by vertex waves, sleep spindles (12 to 14 Hz), maximal frontocentral region. EEG showed continuous generalized and lateralized right hemisphere 3 to 6 Hz theta-delta slowing. Generalized sharp transients were noted. Hyperventilation and photic stimulation were not performed.   ABNORMALITY - Continuous slow, generalized and lateralized right hemisphere IMPRESSION: This study is suggestive of cortical dysfunction arising from right hemisphere likely secondary to underlying structural abnormality. Additionally there is moderate diffuse encephalopathy. No seizures were seen throughout the recording. Generalized sharp transients were noted. Recommend long term monitoring for further evaluation Priyanka MALVA Krebs   MR BRAIN WO CONTRAST Result Date: 01/13/2023 CLINICAL DATA:  Follow-up examination for recent stroke, now with evidence for hemorrhagic transformation. EXAM: MRI HEAD WITHOUT CONTRAST TECHNIQUE: Multiplanar, multiecho pulse sequences of the brain and surrounding structures were obtained without intravenous contrast. COMPARISON:  Prior CTs from earlier the same day as well as recent MRI from 12/30/2022. FINDINGS: Brain: Cerebral volume within normal limits for age. Patchy T2/FLAIR hyperintensity involving the supratentorial cerebral white matter, consistent with chronic  small vessel ischemic disease, moderately advanced in nature. Small remote left cerebellar infarct. There has been normal expected interval evolution of previously identified right MCA distribution infarct. Overall, size and distribution of the infarct is relatively stable, although there has been some interval blooming of a few foci since previous. No new areas of infarction. Changes of wallerian degeneration noted extending along the descending right  white matter tract. Associated hemorrhagic transformation with small volume subarachnoid hemorrhage within the right sylvian fissure, stable from prior CT. No other acute or subacute ischemic changes elsewhere within the brain. No other acute or chronic intracranial blood products. No mass lesion or midline shift. No hydrocephalus or extra-axial fluid collection. Pituitary gland suprasellar region within normal limits. Vascular: Major intracranial vascular flow voids are maintained. Skull and upper cervical spine: Craniocervical junction within normal limits. Bone marrow signal intensity normal. No scalp soft tissue abnormality. Sinuses/Orbits: Prior bilateral ocular lens replacement. Paranasal sinuses are largely clear. Trace left mastoid effusion, of doubtful significance. Other: None. IMPRESSION: 1. Normal expected interval evolution of previously identified right MCA distribution infarct. Associated hemorrhagic transformation with small volume subarachnoid hemorrhage within the right Sylvian fissure, stable from prior CT. 2. No other new acute intracranial abnormality. 3. Underlying moderately advanced chronic microvascular ischemic disease. Electronically Signed   By: Morene Hoard M.D.   On: 01/13/2023 23:35   CT ANGIO HEAD NECK W WO CM (CODE STROKE) Result Date: 01/13/2023 CLINICAL DATA:  Initial evaluation for acute neuro deficit, stroke, subarachnoid hemorrhage. EXAM: CT ANGIOGRAPHY HEAD AND NECK WITH AND WITHOUT CONTRAST TECHNIQUE: Multidetector CT imaging of the head and neck was performed using the standard protocol during bolus administration of intravenous contrast. Multiplanar CT image reconstructions and MIPs were obtained to evaluate the vascular anatomy. Carotid stenosis measurements (when applicable) are obtained utilizing NASCET criteria, using the distal internal carotid diameter as the denominator. RADIATION DOSE REDUCTION: This exam was performed according to the departmental  dose-optimization program which includes automated exposure control, adjustment of the mA and/or kV according to patient size and/or use of iterative reconstruction technique. CONTRAST:  75mL OMNIPAQUE  IOHEXOL  350 MG/ML SOLN COMPARISON:  Prior CT from earlier the same day as well as MRA from 12/30/2022 FINDINGS: CTA NECK FINDINGS Aortic arch: Aortic arch within normal limits for caliber with standard branch pattern. Aortic atherosclerosis. No significant stenosis about the origin the great vessels. Right carotid system: Right common and internal carotid arteries are patent without dissection. Moderate calcified plaque about the right carotid bulb without hemodynamically significant greater than 50% stenosis. Left carotid system: Left common and internal carotid arteries are patent without dissection. Mild-to-moderate atheromatous plaque about the left carotid bulb without hemodynamically significant greater than 50% stenosis. Vertebral arteries: Right vertebral artery dominant. Atheromatous plaque at the origin of the right vertebral artery with moderate to severe stenosis. Vertebral arteries are otherwise patent without stenosis or dissection. Skeleton: No worrisome osseous lesions. Advanced spondylosis at C4-5 through C6-7. patient is edentulous. Osteoarthritic changes noted about the TMJs. Other neck: No other acute finding. Upper chest: No other acute finding. Review of the MIP images confirms the above findings CTA HEAD FINDINGS Anterior circulation: Moderate atheromatous change about the carotid siphons without hemodynamically significant stenosis. Note again made of a 3-4 mm outpouching extending posteriorly and inferiorly from the supraclinoid left ICA, which could reflect a small aneurysm versus vascular infundibulum (series 6, image 102). A similar 3-4 mm outpouching at the contralateral supraclinoid right ICA (series 6, image 103), which could also reflect a small  infundibulum versus aneurysm. A1 segments  patent bilaterally. Normal anterior communicating artery complex. Anterior cerebral arteries patent without significant stenosis. No M1 stenosis or occlusion. Normal MCA bifurcations. Distal MCA branches remain patent and perfused. Posterior circulation: Both V4 segments patent without stenosis. Neither PICA origin well visualized. Basilar patent without stenosis. Superior cerebellar and posterior cerebral arteries patent bilaterally. Venous sinuses: Not well assessed due to timing of the contrast bolus. Anatomic variants: As above. Review of the MIP images confirms the above findings IMPRESSION: 1. Negative CTA for large vessel occlusion or other emergent finding. 2. 3-4 mm outpouchings extending posteriorly and inferiorly from both supraclinoid ICAs, which could reflect small aneurysms versus vascular infundibula. Appearance is similar as compared to recent MRA from 12/30/2022. 3. Moderate atheromatous plaque about the carotid bifurcations and carotid siphons without hemodynamically significant stenosis. 4. Atheromatous plaque at the origin of the right vertebral artery with moderate to severe stenosis. 5.  Aortic Atherosclerosis (ICD10-I70.0). Electronically Signed   By: Morene Hoard M.D.   On: 01/13/2023 21:06   CT HEAD CODE STROKE WO CONTRAST Result Date: 01/13/2023 CLINICAL DATA:  Code stroke. Initial evaluation for neuro deficit, facial droop. EXAM: CT HEAD WITHOUT CONTRAST TECHNIQUE: Contiguous axial images were obtained from the base of the skull through the vertex without intravenous contrast. RADIATION DOSE REDUCTION: This exam was performed according to the departmental dose-optimization program which includes automated exposure control, adjustment of the mA and/or kV according to patient size and/or use of iterative reconstruction technique. COMPARISON:  Prior MRI from 12/30/2022 FINDINGS: Brain: Abnormal hypodensity related to recently identified right MCA distribution infarcts again seen,  similar to prior. There has been interval development of scattered small volume subarachnoid hemorrhage within the right sylvian fissure. No significant mass effect. No other acute intracranial hemorrhage. No other acute large vessel territory infarct. No mass lesion or midline shift. No hydrocephalus or extra-axial fluid collection. Vascular: No abnormal hyperdense vessel. Skull: Scalp soft tissues within normal limits.  Calvarium intact. Sinuses/Orbits: Globes and orbital soft tissues within normal limits. Small volume pneumatized secretions noted within the left sphenoid sinus. Paranasal sinuses are otherwise clear. No mastoid effusion. Other: None. ASPECTS [facility] Stroke Program Early CT Score) - Ganglionic level infarction (caudate, lentiform nuclei, internal capsule, insula, M1-M3 cortex): 4 - Supraganglionic infarction (M4-M6 cortex): 3 Total score (0-10 with 10 being normal): 7 IMPRESSION: 1. Interval development of scattered small volume subarachnoid hemorrhage within the right Sylvian fissure. No significant mass effect. Upon review of recent MRA there is question of a 3-4 mm outpouching at the takeoff of the right PCOM, which could potentially reflect a small aneurysm versus infundibulum, not appreciated on initial interpretation. This finding is not certain, as no aneurysm is described at this location on recent arteriogram. 2. Evolving right MCA distribution infarcts, similar to prior. 3. Aspects is 7. Critical Value/emergent results were called by telephone at the time of interpretation on 01/13/2023 at 7:59 pm to provider DAVID WELLS , who verbally acknowledged these results. Electronically Signed   By: Morene Hoard M.D.   On: 01/13/2023 20:11   DG Swallowing Func-Speech Pathology Result Date: 01/12/2023 Table formatting from the original result was not included. Modified Barium Swallow Study Patient Details Name: Jocelyn [name] MRN: [id number] Date of Birth: 1931-08-18 Today's Date:  01/11/2023 HPI/PMH: HPI: 89 yo female presenting on 11/27 with L sided weakness and R MCA occlusion s/p IR revascularization with complicated arterial cannulation requiring multiple sticks. Intubated 11/28-11/29. PMH includes: GERD, HTN, CHF, AFIB RVR on  eliquis , HLD, OSA. MBSS 01/02/23 was limited due to oral holding. Clinical Impression: Clinical Impression: Pt presents with s/sx moderate oral dysphagia c/b intermittent oral holding, repetitive/disorganized lingual motion, prolonged mastication, intermittent piecemeal swallow, and trace-mild oral stasis. Oral deficits likely due to dental status and exacerbated by cognitive deficits. Pharyngeal swallow c/b  trace-mild pharyngeal stasis which was mainly in vallecula, but also observed on posterior pharyngeal swallow and pyriform sinus. Residual secondary to decreased base of tongue retraction and reduced pharyngeal stripping wave in setting of suspected cervical osteophytes.  Age-related swallow changes appreciated including pre-swallow and during the swallow (on secondary swallows) penetration occured with x1 very large straw sip of then when used as a liquid wash for solids as well as swallow initiation at the pyriform sinus. Concern for possible pharyngoesophageal component to pt's dysphagia given prominent appearance to the cricopharyngeus. Given today's findings, recommend initiation of a puree diet with thin liquids with safe swallowing strategies/aspiration precautions as outlined below. Concern for pt's ability to meet oral needs nutritionally given severity of oral deficits. Pt OK for solids to be advanced clinically. SLP to continue to f/u per POC. Factors that may increase risk of adverse event in presence of aspiration Jocelyn [name] 2021): Factors that may increase risk of adverse event in presence of aspiration Jocelyn [name] 2021): Reduced cognitive function; Limited mobility; Frail or deconditioned; Dependence for feeding and/or oral hygiene  Recommendations/Plan: Swallowing Evaluation Recommendations Swallowing Evaluation Recommendations Recommendations: PO diet PO Diet Recommendation: Dysphagia 1 (Pureed); Thin liquids (Level 0) Liquid Administration via: Spoon; Cup; Straw Medication Administration: Crushed with puree Supervision: Staff to assist with self-feeding; Full assist for feeding; Full supervision/cueing for swallowing strategies Swallowing strategies  : Minimize environmental distractions; Slow rate; Small bites/sips Postural changes: Position pt fully upright for meals; Stay upright 30-60 min after meals Oral care recommendations: Oral care QID (4x/day); Staff/trained caregiver to provide oral care Recommended consults: Consider dietitian consultation Caregiver Recommendations: Have oral suction available Treatment Plan Treatment Plan Treatment recommendations: Therapy as outlined in treatment plan below Follow-up recommendations: Skilled nursing-short term rehab (<3 hours/day) Functional status assessment: Patient has had a recent decline in their functional status and demonstrates the ability to make significant improvements in function in a reasonable and predictable amount of time. Treatment frequency: Min 2x/week Treatment duration: 2 weeks Interventions: Aspiration precaution training; Patient/family education; Compensatory techniques; Trials of upgraded texture/liquids Recommendations Recommendations for follow up therapy are one component of a multi-disciplinary discharge planning process, led by the attending physician.  Recommendations may be updated based on patient status, additional functional criteria and insurance authorization. Assessment: Orofacial Exam: Orofacial Exam Oral Cavity: Oral Hygiene: WFL Oral Cavity - Dentition: Edentulous Orofacial Anatomy: WFL Anatomy: Anatomy: Suspected cervical osteophytes; Prominent cricopharyngeus Boluses Administered: Boluses Administered Boluses Administered: Thin liquids (Level 0);  Puree; Solid  Oral Impairment Domain: Oral Impairment Domain Lip Closure: Interlabial escape, no progression to anterior lip Tongue control during bolus hold: Not tested Bolus preparation/mastication: Slow prolonged chewing/mashing with complete recollection Bolus transport/lingual motion: Delayed initiation of tongue motion (oral holding); Repetitive/disorganized tongue motion Oral residue: Residue collection on oral structures Location of oral residue : Tongue Initiation of pharyngeal swallow : Pyriform sinuses  Pharyngeal Impairment Domain: Pharyngeal Impairment Domain Soft palate elevation: No bolus between soft palate (SP)/pharyngeal wall (PW) Laryngeal elevation: Partial superior movement of thyroid  cartilage/partial approximation of arytenoids to epiglottic petiole Anterior hyoid excursion: Complete anterior movement Epiglottic movement: Complete inversion Laryngeal vestibule closure: Complete, no air/contrast in laryngeal vestibule Pharyngeal stripping wave :  Present - diminished Pharyngeal contraction (A/P view only): N/A Pharyngoesophageal segment opening: Partial distention/partial duration, partial obstruction of flow Tongue base retraction: Narrow column of contrast or air between tongue base and PPW Pharyngeal residue: Collection of residue within or on pharyngeal structures Location of pharyngeal residue: Valleculae; Pharyngeal wall; Pyriform sinuses  Esophageal Impairment Domain: Esophageal Impairment Domain Esophageal clearance upright position: -- (deferred) Pill: Pill Consistency administered: -- (deferred) Penetration/Aspiration Scale Score: Penetration/Aspiration Scale Score 1.  Material does not enter airway: Thin liquids (Level 0); Puree; Solid 2.  Material enters airway, remains ABOVE vocal cords then ejected out: Thin liquids (Level 0) Compensatory Strategies: Compensatory Strategies Compensatory strategies: Yes Straw: Effective Effective Straw: Thin liquid (Level 0) Liquid wash: --  (minimally effective in clearing oral stasis with solid)   General Information: Caregiver present: No  Diet Prior to this Study: G-tube   Temperature : Febrile (low grade temp in last 24 hours)   Respiratory Status: WFL   Supplemental O2: None (Room air)   History of Recent Intubation: Yes  Behavior/Cognition: Alert; Cooperative; Pleasant mood; Requires cueing Self-Feeding Abilities: Dependent for feeding Baseline vocal quality/speech: Normal Volitional Cough: Unable to elicit Volitional Swallow: Unable to elicit Exam Limitations: No limitations Goal Planning: Prognosis for improved oropharyngeal function: Good Barriers to Reach Goals: Cognitive deficits No data recorded Patient/Family Stated Goal: did not state Consulted and agree with results and recommendations: Patient; Nurse Pain: Pain Assessment Pain Assessment: No/denies pain Faces Pain Scale: 0 Pain Location: L shoulder when moved Pain Descriptors / Indicators: Grimacing Pain Intervention(s): Monitored during session End of Session: Start Time:SLP Start Time (ACUTE ONLY): 9183 Stop Time: SLP Stop Time (ACUTE ONLY): 9166 Time Calculation:SLP Time Calculation (min) (ACUTE ONLY): 17 min Charges: SLP Evaluations $ SLP Speech Visit: 1 Visit SLP Evaluations $MBS Swallow: 1 Procedure $Swallowing Treatment: 1 Procedure $Speech Treatment for Individual: 1 Procedure SLP visit diagnosis: SLP Visit Diagnosis: Dysphagia, oropharyngeal phase (R13.12); Cognitive communication deficit (R41.841); Attention and concentration deficit; Dysarthria and anarthria (R47.1) Past Medical History: Past Medical History: Diagnosis Date  Arthritis of both knees 08/01/2015  Colitis   Essential hypertension, benign 07/28/2014  GERD (gastroesophageal reflux disease)   Hx of iron deficiency anemia 09/02/2015  Hyperlipidemia   Hypertension   Lumbar pain   Medicare annual wellness visit, subsequent 04/07/2017  OSA on CPAP 08/01/2015  Overweight (BMI 25.0-29.9) 07/10/2013  Prediabetes 09/02/2015  SOB  (shortness of breath) 07/10/2013 Past Surgical History: Past Surgical History: Procedure Laterality Date  ABDOMINAL HYSTERECTOMY    CATARACT EXTRACTION    CHOLECYSTECTOMY    COLONOSCOPY WITH PROPOFOL  N/A 04/09/2018  Procedure: COLONOSCOPY WITH PROPOFOL ;  Surgeon: Unk Corinn Skiff, MD;  Location: [facility] ENDOSCOPY;  Service: Gastroenterology;  Laterality: N/A;  ESOPHAGOGASTRODUODENOSCOPY N/A 01/09/2023  Procedure: ESOPHAGOGASTRODUODENOSCOPY (EGD);  Surgeon: Paola Dreama SAILOR, MD;  Location: [facility] ENDOSCOPY;  Service: General;  Laterality: N/A;  IR CT HEAD LTD  12/29/2022  IR PERCUTANEOUS ART THROMBECTOMY/INFUSION INTRACRANIAL INC DIAG ANGIO  12/29/2022  IR US  GUIDE VASC ACCESS RIGHT  12/29/2022  IR US  GUIDE VASC ACCESS RIGHT  12/29/2022  LUMBAR DISC SURGERY    PEG PLACEMENT N/A 01/09/2023  Procedure: PERCUTANEOUS ENDOSCOPIC GASTROSTOMY (PEG) PLACEMENT;  Surgeon: Paola Dreama SAILOR, MD;  Location: [facility] ENDOSCOPY;  Service: General;  Laterality: N/A;  RADIOLOGY WITH ANESTHESIA N/A 12/29/2022  Procedure: IR WITH ANESTHESIA;  Surgeon: Radiologist, Medication, MD;  Location: [facility] OR;  Service: Radiology;  Laterality: N/A; Delon Bangs, M.S., CCC-SLP Speech-Language Pathologist Secure Chat Preferred O: [phone] Delon CHRISTELLA Bangs 01/11/2023,  9:15 AM   DG CHEST PORT 1 VIEW Result Date: 01/09/2023 CLINICAL DATA:  Respiratory abnormality. EXAM: PORTABLE CHEST 1 VIEW COMPARISON:  Chest radiograph dated 12/31/2022. FINDINGS: Significant improvement in the previously seen left lung opacities. Small left lung base density and possible trace pleural effusion. Faint areas of increased density in the right upper lobe also concerning for infiltrate. No pneumothorax. Mild cardiomegaly. No acute osseous pathology. IMPRESSION: 1. Significant improvement in the previously seen left lung opacities. 2. Small left lung base density and possible trace pleural effusion. 3. Faint areas of increased density in the right upper lobe also concerning  for infiltrate. Electronically Signed   By: Vanetta Chou M.D.   On: 01/09/2023 19:34    Microbiology: Recent Results (from the past 240 hours)  Culture, blood (Routine X 2) w Reflex to ID Panel     Status: None   Collection Time: 01/24/23  5:40 AM   Specimen: BLOOD LEFT HAND  Result Value Ref Range Status   Specimen Description BLOOD LEFT HAND  Final   Special Requests   Final    BOTTLES DRAWN AEROBIC ONLY Blood Culture results may not be optimal due to an inadequate volume of blood received in culture bottles   Culture   Final    NO GROWTH 5 DAYS Performed at [facility], [address], [place], [name] [address]    Report Status 01/29/2023 FINAL  Final  Culture, blood (Routine X 2) w Reflex to ID Panel     Status: Abnormal   Collection Time: 01/24/23  5:55 AM   Specimen: BLOOD LEFT HAND  Result Value Ref Range Status   Specimen Description BLOOD LEFT HAND  Final   Special Requests   Final    BOTTLES DRAWN AEROBIC AND ANAEROBIC Blood Culture results may not be optimal due to an inadequate volume of blood received in culture bottles   Culture  Setup Time   Final    GRAM POSITIVE COCCI ANAEROBIC BOTTLE ONLY CRITICAL RESULT CALLED TO, READ BACK BY AND VERIFIED WITH: PHARMD JESSICA MILLEN [id number] BY J RAZZAK, MT    Culture (A)  Final    STAPHYLOCOCCUS EPIDERMIDIS THE SIGNIFICANCE OF ISOLATING THIS ORGANISM FROM A SINGLE SET OF BLOOD CULTURES WHEN MULTIPLE SETS ARE DRAWN IS UNCERTAIN. PLEASE NOTIFY THE MICROBIOLOGY DEPARTMENT WITHIN ONE WEEK IF SPECIATION AND SENSITIVITIES ARE REQUIRED. Performed at [facility], [address], [place], [name] [address]    Report Status 01/30/2023 FINAL  Final  Blood Culture ID Panel (Reflexed)     Status: Abnormal   Collection Time: 01/24/23  5:55 AM  Result Value Ref Range Status   Enterococcus faecalis NOT DETECTED NOT DETECTED Final   Enterococcus Faecium NOT DETECTED NOT DETECTED Final   Listeria monocytogenes NOT  DETECTED NOT DETECTED Final   Staphylococcus species DETECTED (A) NOT DETECTED Final    Comment: CRITICAL RESULT CALLED TO, READ BACK BY AND VERIFIED WITH: PHARMD JESSICA MILLEN [id number] BY J RAZZAK, MT    Staphylococcus aureus (BCID) NOT DETECTED NOT DETECTED Final   Staphylococcus epidermidis DETECTED (A) NOT DETECTED Final    Comment: Methicillin (oxacillin) resistant coagulase negative staphylococcus. Possible blood culture contaminant (unless isolated from more than one blood culture draw or clinical case suggests pathogenicity). No antibiotic treatment is indicated for blood  culture contaminants. CRITICAL RESULT CALLED TO, READ BACK BY AND VERIFIED WITH: PHARMD JESSICA MILLEN [phone] [id number] BY J RAZZAK, MT    Staphylococcus lugdunensis NOT DETECTED  NOT DETECTED Final   Streptococcus species NOT DETECTED NOT DETECTED Final   Streptococcus agalactiae NOT DETECTED NOT DETECTED Final   Streptococcus pneumoniae NOT DETECTED NOT DETECTED Final   Streptococcus pyogenes NOT DETECTED NOT DETECTED Final   A.calcoaceticus-baumannii NOT DETECTED NOT DETECTED Final   Bacteroides fragilis NOT DETECTED NOT DETECTED Final   Enterobacterales NOT DETECTED NOT DETECTED Final   Enterobacter cloacae complex NOT DETECTED NOT DETECTED Final   Escherichia coli NOT DETECTED NOT DETECTED Final   Klebsiella aerogenes NOT DETECTED NOT DETECTED Final   Klebsiella oxytoca NOT DETECTED NOT DETECTED Final   Klebsiella pneumoniae NOT DETECTED NOT DETECTED Final   Proteus species NOT DETECTED NOT DETECTED Final   Salmonella species NOT DETECTED NOT DETECTED Final   Serratia marcescens NOT DETECTED NOT DETECTED Final   Haemophilus influenzae NOT DETECTED NOT DETECTED Final   Neisseria meningitidis NOT DETECTED NOT DETECTED Final   Pseudomonas aeruginosa NOT DETECTED NOT DETECTED Final   Stenotrophomonas maltophilia NOT DETECTED NOT DETECTED Final   Candida albicans NOT DETECTED NOT DETECTED Final   Candida  auris NOT DETECTED NOT DETECTED Final   Candida glabrata NOT DETECTED NOT DETECTED Final   Candida krusei NOT DETECTED NOT DETECTED Final   Candida parapsilosis NOT DETECTED NOT DETECTED Final   Candida tropicalis NOT DETECTED NOT DETECTED Final   Cryptococcus neoformans/gattii NOT DETECTED NOT DETECTED Final   Methicillin resistance mecA/C DETECTED (A) NOT DETECTED Final    Comment: CRITICAL RESULT CALLED TO, READ BACK BY AND VERIFIED WITH: PHARMD JESSICA MILLEN [phone] [id number] BY JINNY COMMON, MT Performed at [facility], [address], [place], [name] [address]   Respiratory (~20 pathogens) panel by PCR     Status: None   Collection Time: 01/24/23 10:24 PM   Specimen: Nasopharyngeal Swab; Respiratory  Result Value Ref Range Status   Adenovirus NOT DETECTED NOT DETECTED Final   Coronavirus 229E NOT DETECTED NOT DETECTED Final    Comment: (NOTE) The Coronavirus on the Respiratory Panel, DOES NOT test for the novel  Coronavirus (2019 nCoV)    Coronavirus HKU1 NOT DETECTED NOT DETECTED Final   Coronavirus NL63 NOT DETECTED NOT DETECTED Final   Coronavirus OC43 NOT DETECTED NOT DETECTED Final   Metapneumovirus NOT DETECTED NOT DETECTED Final   Rhinovirus / Enterovirus NOT DETECTED NOT DETECTED Final   Influenza A NOT DETECTED NOT DETECTED Final   Influenza B NOT DETECTED NOT DETECTED Final   Parainfluenza Virus 1 NOT DETECTED NOT DETECTED Final   Parainfluenza Virus 2 NOT DETECTED NOT DETECTED Final   Parainfluenza Virus 3 NOT DETECTED NOT DETECTED Final   Parainfluenza Virus 4 NOT DETECTED NOT DETECTED Final   Respiratory Syncytial Virus NOT DETECTED NOT DETECTED Final   Bordetella pertussis NOT DETECTED NOT DETECTED Final   Bordetella Parapertussis NOT DETECTED NOT DETECTED Final   Chlamydophila pneumoniae NOT DETECTED NOT DETECTED Final   Mycoplasma pneumoniae NOT DETECTED NOT DETECTED Final    Comment: Performed at [facility], [address], [place], [name]  [address]  Aerobic/Anaerobic Culture w Gram Stain (surgical/deep wound)     Status: None   Collection Time: 01/27/23  1:39 PM   Specimen: Path fluid; Body Fluid  Result Value Ref Range Status   Specimen Description ABSCESS ABDOMEN  Final   Special Requests SWABS  Final   Gram Stain   Final    ABUNDANT WBC PRESENT, PREDOMINANTLY PMN RARE GRAM POSITIVE COCCI    Culture   Final  MODERATE STREPTOCOCCUS ANGINOSIS FEW ENTEROBACTER CLOACAE NO ANAEROBES ISOLATED Performed at [facility], [address] [place], [name] [address]    Report Status 02/01/2023 FINAL  Final   Organism ID, Bacteria ENTEROBACTER CLOACAE  Final   Organism ID, Bacteria STREPTOCOCCUS ANGINOSIS  Final      Susceptibility   Enterobacter cloacae - MIC*    CEFEPIME  0.5 SENSITIVE Sensitive     CEFTAZIDIME >=64 RESISTANT Resistant     CIPROFLOXACIN  <=0.25 SENSITIVE Sensitive     GENTAMICIN <=1 SENSITIVE Sensitive     IMIPENEM 0.5 SENSITIVE Sensitive     TRIMETH /SULFA  <=20 SENSITIVE Sensitive     PIP/TAZO >=128 RESISTANT Resistant ug/mL    * FEW ENTEROBACTER CLOACAE   Streptococcus anginosis - MIC*    PENICILLIN <=0.06 SENSITIVE Sensitive     CEFTRIAXONE  <=0.12 SENSITIVE Sensitive     ERYTHROMYCIN >=8 RESISTANT Resistant     LEVOFLOXACIN 0.5 SENSITIVE Sensitive     VANCOMYCIN  0.5 SENSITIVE Sensitive     * MODERATE STREPTOCOCCUS ANGINOSIS     Labs: Basic Metabolic Panel: Recent Labs  Lab 01/29/23 0450 01/30/23 0626 01/31/23 0536 02/01/23 0654 02/02/23 0458  NA 136 136 137 134* 135  K 4.7 5.1 4.7 4.6 4.9  CL 103 101 102 100 102  CO2 28 27 29 25 25   GLUCOSE 125* 118* 127* 128* 123*  BUN 20 19 16 16 14   CREATININE 0.49 0.53 0.53 0.43* 0.53  CALCIUM  8.7* 8.7* 9.0 9.1 9.1   Liver Function Tests: Recent Labs  Lab 01/28/23 0434 01/29/23 0450 01/30/23 0626 01/31/23 0536 02/02/23 0458  AST 43* 43* 30 29 35  ALT 60* 55* 41 40 36  ALKPHOS 74 65 63 63 65  BILITOT 0.5 0.5 0.4 0.4 0.2  PROT 6.6 6.6  6.2* 6.8 6.7  ALBUMIN  2.0* 2.0* 2.0* 2.1* 2.1*   No results for input(s): LIPASE, AMYLASE in the last 168 hours. No results for input(s): AMMONIA in the last 168 hours. CBC: Recent Labs  Lab 01/29/23 0450 01/30/23 0626 01/31/23 0536 02/01/23 0654 02/02/23 0458  WBC 9.1 7.5 8.8 9.4 9.1  NEUTROABS  --   --   --  5.9  --   HGB 8.1* 8.5* 9.7* 9.6* 9.5*  HCT 25.4* 26.5* 30.2* 29.8* 29.5*  MCV 84.4 83.9 84.4 83.5 82.9  PLT 231 243 268 289 264   Cardiac Enzymes: No results for input(s): CKTOTAL, CKMB, CKMBINDEX, TROPONINI in the last 168 hours. BNP: BNP (last 3 results) Recent Labs    12/07/22 1535  BNP 323.1*    ProBNP (last 3 results) No results for input(s): PROBNP in the last 8760 hours.  CBG: Recent Labs  Lab 02/01/23 1415 02/01/23 2121 02/01/23 2309 02/02/23 0554 02/02/23 1332  GLUCAP 158* 125* 154* 129* 114*       Signed:  Toribio Hummer MD.  [facility] 02/02/2023, 4:01 PM

## 2023-02-02 NOTE — Progress Notes (Signed)
 Nutrition Follow-up  DOCUMENTATION CODES:   Non-severe (moderate) malnutrition in context of chronic illness  INTERVENTION:  Tube feeding via PEG - continues Osmolite 1.2 at 55 ml/h (1320 ml per day) Free water  flush 100mL q4h  Provides 1584 kcal, 73 gm protein, 1082 ml free water  daily (TF+flush = free water ) Banatrol BID via tube to provide 5g of fiber per packet - continues MVI with minerals daily - continues Vitamin C  250mg  x 30 days for wounds and presence of scorbutic tongue - continues   NUTRITION DIAGNOSIS:   Moderate Malnutrition related to chronic illness (stroke, dysphagia) as evidenced by mild fat depletion, moderate fat depletion, moderate muscle depletion, severe muscle depletion. - continues/ongoing   GOAL:  Patient will meet greater than or equal to 90% of their needs - meeting   MONITOR:  TF tolerance, Diet advancement, Labs, Weight trends  REASON FOR ASSESSMENT:   Consult Enteral/tube feeding initiation and management  ASSESSMENT:   89 yo female with L sided weakness and confusion with SAH. Pt with recent discharge from the hospital after admission for acute R MCA infarct. Pt with hx of GERD, HTN.  12/9 - PEG placed (last admission) 12/14 - Re-admitted with [facility] 12/15 - PEG dislodged 12/16 - PEG replaced in IR, bedside I&D of abdominal wall with penrose drain placement 12/19 - bedside I&D of abdominal wall, penrose drain sutured to skin 12/23 - completed cefepime  x7 days 12/26 - aspiration concern; SLP cleared for D1 diet 12/27 - debridement of abdominal wall abscess 12/28 - IV ABX started r/t PNA dx 12/31 - Upgraded to D2 diet, thin liquids  Met with patient at bedside today. She endorses no pain or s/s of intolerance to TF regimen. Lat BM today. Per intake documentation, has consumed 50% x2 meals, but requiring feeding assistance.   Has gained 5% body weight since admission. This is desirable and suggestive of meeting estimated nutrition  needs.  Admission Weight: 64.3 kg Current Weight: 67.8 kg  Labs: CBGs 118-128 - daily x5 days A1c 6.4 (12/2022)  Meds: amlodipine , amoxicillin , vitamin C , calcium -vit D, famotidine , ferrous sulfate , banatrol, insulin  SSI, MVI, bactrim   Diet Order:   Diet Order             DIET DYS 2 Room service appropriate? Yes with Assist; Fluid consistency: Thin  Diet effective now             EDUCATION NEEDS:   Not appropriate for education at this time  Skin:  Skin Assessment: Reviewed RN Assessment  Last BM:  01/02  Height:  Ht Readings from Last 1 Encounters:  01/19/23 5' 3 (1.6 m)    Weight:  Wt Readings from Last 1 Encounters:  01/26/23 67.8 kg    Ideal Body Weight:  52.3 kg  BMI:  Body mass index is 26.48 kg/m.  Estimated Nutritional Needs:   Kcal:  1450-1650 kcals  Protein:  70-80 g  Fluid:  >/= 1.5 L  Blair Deaner MS, RD, LDN Registered Dietitian Clinical Nutrition RD Inpatient Contact Info in Amion

## 2023-02-03 DIAGNOSIS — I619 Nontraumatic intracerebral hemorrhage, unspecified: Secondary | ICD-10-CM | POA: Diagnosis not present

## 2023-02-03 DIAGNOSIS — L02211 Cutaneous abscess of abdominal wall: Secondary | ICD-10-CM | POA: Diagnosis not present

## 2023-02-03 LAB — GLUCOSE, CAPILLARY
Glucose-Capillary: 97 mg/dL (ref 70–99)
Glucose-Capillary: 109 mg/dL — ABNORMAL HIGH (ref 70–99)
Glucose-Capillary: 123 mg/dL — ABNORMAL HIGH (ref 70–99)

## 2023-02-03 NOTE — TOC Progression Note (Signed)
 Transition of Care [facility]) - Progression Note    Patient Details  Name: Jocelyn [name] MRN: [id number] Date of Birth: October 25, 1931  [facility] CM/SW Contact  [name], [name] Phone Number: 02/03/2023, 12:53 PM  Clinical Narrative:   CSW spoke with daughter, Garen, this morning to confirm that she was able to speak with MD yesterday. CSW updated by MD that patient not stable for discharge at this time. CSW updated [employer]. Authorization expires today, will need a new insurance authorization once stable.    Expected Discharge Plan: Skilled Nursing Facility Barriers to Discharge: Continued Medical Work up, English As A Second Language Teacher  Expected Discharge Plan and Services         Expected Discharge Date: 02/02/23                                     Social Determinants of Health (SDOH) Interventions SDOH Screenings   Food Insecurity: Patient Unable To Answer (01/14/2023)  Housing: Patient Unable To Answer (01/14/2023)  Transportation Needs: Unknown (01/14/2023)  Utilities: Patient Unable To Answer (01/14/2023)  Alcohol Screen: Low Risk  (12/03/2018)  Depression (PHQ2-9): Low Risk  (12/06/2022)  Financial Resource Strain: Medium Risk (07/26/2018)  Physical Activity: Inactive (07/26/2018)  Social Connections: Moderately Isolated (07/26/2018)  Stress: No Stress Concern Present (07/26/2018)  Tobacco Use: Low Risk  (01/27/2023)    Readmission Risk Interventions     No data to display

## 2023-02-03 NOTE — Plan of Care (Signed)
 Has been kept turned and reposition every 2 -3 hours. Smiling and thankful for the care.   No complaint noted.  Problem: Education: Goal: Knowledge of disease or condition will improve Outcome: Progressing   Problem: Education: Goal: Knowledge of disease or condition will improve Outcome: Progressing   Problem: Nutrition: Goal: Risk of aspiration will decrease Outcome: Progressing   Problem: Education: Goal: Knowledge of patient specific risk factors will improve Alonso N/A or DELETE if not current risk factor) Outcome: Progressing

## 2023-02-03 NOTE — Progress Notes (Signed)
 Progress Note   Patient: Jocelyn [name] FMW:[id number] DOB: 1931-10-10 DOA: 01/14/2023     20 DOS: the patient was seen and examined on 02/03/2023   Brief hospital course: 89 y.o. female  has a past medical history of Arthritis of both knees (08/01/2015), Colitis, Essential hypertension, benign (07/28/2014), GERD (gastroesophageal reflux disease), iron deficiency anemia (09/02/2015), Hyperlipidemia, Hypertension, Lumbar pain, Medicare annual wellness visit, subsequent (04/07/2017), OSA on CPAP (08/01/2015), Overweight (BMI 25.0-29.9) (07/10/2013), Prediabetes (09/02/2015), and SOB (shortness of breath) (07/10/2013)., atrial fibrillation (on Eliquis ) who was recently discharged from the Stroke service at [facility] on 12/11 after management of an acute right MCA territory infarct in the setting of atrial fibrillation. She had presented at that time with left-sided weakness and confusion. Imaging had revealed a right A1 and M2 occlusion and she underwent successful mechanical thrombectomy, with TICI 3 revascularization. which was successful.   Follow up MRI after VIR, showed scattered acute right MCA distribution infarcts involving the right caudate and lentiform nuclei, right insula, and overlying right cerebral hemisphere, small volume petechial hemorrhage at the posterior right frontal region. MRA showed interval revascularization of previously seen clot at the right ICA terminus and right M2 segment; focal moderate distal right P2 stenosis. 2D Echo showed EF 60 to 65%, LV severe concentric hypertrophy, LA severely dilated.   Her Eliquis  was restarted on 12/10 for her chronic A-fib. She was initially unable to pass her swallow study, and PEG tube was inserted on 12/9 with tube feeding started, although patient did pass swallow study afterwards and was beginning to take foods by mouth at the time of her discharge. She did have some pain in her left knee, which was successfully treated with lidocaine  patches. She was  discharged to a SNF.   She re-presented to the ED at [facility] on Friday from [facility] rehab facility for acute stroke symptoms. LKN was 1830. She had been at [facility] for LUE weakness. Per EMS she had new onset L side facial droop, numbness to L arm, and significantly increased weakness to L arm. CTH showed interval development of scattered subarachnoid hemorrhage in the right sylvian fissure. Teleneurology was consulted. NIHSS was 17. Teleneurology advised DOAC reversal with KCentra , BP management, EEG for observed facial twitching and admission as well as Neurosurgery consult. Neurosurgery recommended Keppra  500 mg. CTA showed no LVO; atheromatous plaque was again noted. 3-4 mm outpouchings extending posteriorly and inferiorly from both supraclinoid ICAs were noted, which Radiology felt could reflect small aneurysms versus vascular infundibula; the outpouchings were similar in appearance compared to recent MRA from 12/30/2022.  Assessment and Plan: Subarachnoid hemorrhage - possible cerebral aneurysm Noted at time of this readmission - occurred while on Eliquis  therapy - imaging has suggested outpouchings extending posteriorly and inferiorly from both supraclinoid ICAs which could reflect small aneurysms - these were noted on CTa as well as MRa - yearly MRa is recommended in follow-up by the Stroke Team. Per Neurology note, now not on antithrombotic. -Outpatient follow-up with neurology.   Recent CVA Admitted to the hospital 11/28-12/12/2022 for treatment of a right MCA territory infarct felt to be cardioembolic due to A-fib - an acute clot was noted at the right terminal ICA and the patient underwent intervention with subsequent revascularization   Abdominal wall abscess General Surgery has been following with I&D performed 12/16 with Penrose drain placement and again 12/19 with placement of a second Penrose drain -wound care per General Surgery - IV antibiotic therapy resumed.  -pt taken to OR 12/27  for I/D. WBC normalized. thus far Enterobacter cloacae and Streptococcus aginosis noted out of cultures. -ID consulted for antibiotic duration and recommendations and antibiotics changed to oral Bactrim  and amoxicillin  through January 5 per ID recommendations. -General Surgery following and cleared patient for discharge with close outpatient follow-up.  -Family has requested close monitoring until completion of antibiotics   Enterobacter bacteremia Noted on 1 of 2 blood cultures drawn 12/15 - third-generation cephalosporin and Zosyn resistant - had earlier completed 7 full days of cefepime  therapy -Repeat blood cx neg.   Aspiration pneumonia Patient initially was on azithromycin  which was subsequently discontinued as patient was placed on meropenem  for treatment of abdominal wall abscess.  -Patient also followed by speech therapy was placed on dysphagia 1 diet and upgrade to a dysphagia 2 diet which she was tolerating.   -Patient placed on aspiration precautions.   -Patient improved clinically, remained afebrile, oxygen sats noted to be 96 to 98% on room air by day of discharge.    Oral candidiasis Patient maintained on nystatin  swish/swallow and will be discharged on 7 more days of nystatin  swish and swallow.   Dysphagia due to stroke PEG tube originally placed 12/9 during prior admission - this became dislodged 12/15 with the tract temporarily preserved using a Foley catheter which was subsequently exchanged for a replacement PEG tube in IR 12/16 -Re-evaluated by SLP 12/26 given concerns of aspiration. Was cleared for dysphagia 1 diet initially and diet advanced to dysphagia 2 diet. -Patient maintained on tube feeds which she will be discharged on in addition to dysphagia 2 diet. -Outpatient follow-up with speech therapy at facility..   Abnormal EEG Routine EEG 12/14 raised the question of cortical dysfunction arising from the right hemisphere but with no evidence of seizure activity -  long-term EEG revealed no evidence of active seizure activity. -Patient was seen in consultation by teleneurology.   HTN -Patient maintained on Norvasc  during the hospitalization with good blood pressure control.   -Patient is Avapro , HCTZ and spironolactone  were held and will be discontinued on discharge.   -On follow-up for further blood pressure control is needed may consider resuming patient is Avapro  and spironolactone .     Chronic atrial fibrillation -Rate controlled.   -Eliquis  discontinued due to [facility] - rate controlled   Transaminitis Statin on hold due to transaminitis which improved during the hospitalization and had resolved by day of discharge.   -Statin was held due to elevated transaminitis and will be resumed on discharge.   -Outpatient follow-up.    HLD LDL 33 - statin discontinued 12/17 due to elevated LFTs -Transaminitis improved and had resolved by day of discharge. -Statin will be resumed on discharge. -Outpatient follow-up.   Pressure injury, Pressure Injury 01/21/23 Sacrum Stage 2 -  Partial thickness loss of dermis presenting as a shallow open injury with a red, pink wound bed without slough. red open sore (Active)  01/21/23 0800  Location: Sacrum  Location Orientation:   Staging: Stage 2 -  Partial thickness loss of dermis presenting as a shallow open injury with a red, pink wound bed without slough.  Wound Description (Comments): red open sore  Present on Admission: No     Pressure Injury 01/21/23 Heel Left;Posterior Deep Tissue Pressure Injury - Purple or maroon localized area of discolored intact skin or blood-filled blister due to damage of underlying soft tissue from pressure and/or shear. purplish area (Active)  01/21/23 2000  Location: Heel  Location Orientation: Left;Posterior  Staging: Deep Tissue Pressure Injury -  Purple or maroon localized area of discolored intact skin or blood-filled blister due to damage of underlying soft tissue from pressure  and/or shear.  Wound Description (Comments): purplish area  Present on Admission: -- (unknown, no documentation found)          Subjective: Without complaints today  Physical Exam: Vitals:   02/03/23 0020 02/03/23 0504 02/03/23 0738 02/03/23 1118  BP: (!) 143/80 (!) 140/69 134/63 (!) 150/67  Pulse: 94 88 95 99  Resp: (!) 22 (!) 21 16 16   Temp: 99.3 F (37.4 C) 98.5 F (36.9 C) 98.5 F (36.9 C) 98.3 F (36.8 C)  TempSrc: Axillary Oral Oral Axillary  SpO2: 100% 100% 100% 100%  Weight:      Height:       General exam: Awake, laying in bed, in nad Respiratory system: Normal respiratory effort, no wheezing Cardiovascular system: regular rate, s1, s2 Gastrointestinal system: Soft, nondistended, positive BS Central nervous system: no seizures no tremors Extremities: Perfused, no clubbing Skin: Normal skin turgor, no rashes Psychiatry: Mood normal // no visual hallucinations   Data Reviewed:  Labs reviewed: Na 135, K 4.9, Cr 0.53, WBC 9.1, hgb 9.5, Plts 264  Family Communication: Pt in room, family over phone  Disposition: Status is: Inpatient Remains inpatient appropriate because: severity of illness  Planned Discharge Destination: Skilled nursing facility     Author: Garnette Pelt, MD 02/03/2023 3:25 PM  For on call review [email or web address].

## 2023-02-03 NOTE — Progress Notes (Addendum)
 Speech Language Pathology Treatment: Dysphagia  Patient Details Name: Jocelyn [name] MRN: [id number] DOB: January 04, 1932 Today's Date: 02/03/2023 Time: 8346-8295 SLP Time Calculation (min) (ACUTE ONLY): 11 min  Assessment / Plan / Recommendation Clinical Impression  Pt demonstrating great progress towards dysphagia goals today. She has mildly prolonged mastication of advanced solid textures, although independently asked for a liquid wash which assisted with clearing oral residue. No s/s of aspiration were noted throughout the session. Recommend upgrading diet to Dys 3 solids and continuing thin liquids. Pt will continue to benefit from full supervision and assistance for feeding and to cue to use a liquid wash. If able to locate dentures, suspect this would also greatly benefit pt. SLP will continue following.    HPI HPI: 89 yo female presenting back to the hospital (transfer from ED at [facility]) from SNF on 12/14 with acute stroke symptoms and per EMS she had a new onset of left sided facial droop, numbness to left arm and significantly increased weakness of left arm. CTH showed showed interval development of scattered subarachnoid hemorrhage in the right sylvian fissure. Neurosurgery consulted and recommended Keppra  500mg . WBC elevated and trauma was consulted to evaluate PEG tube with CT abdomen pending. She was recently admitted 11/27-12/11 with L sided weakness and R MCA occlusion s/p IR revascularization with complicated arterial cannulation requiring multiple sticks. Intubated 11/28-11/29. PMH includes: GERD, HTN, CHF, AFIB RVR on eliquis , HLD, OSA. MBSS 01/02/23 was limited due to oral holding, repeat MBS on 12/11 showed moderate oral dysphagia and suspected pharyngoesophageal dysphagia with prominent cricopharyngeaus and recommened PO diet of Dys 1 (puree), thin liquids; ST f/u for dysphagia tx/potential upgrade to diet.      SLP Plan  Continue with current plan of care      Recommendations for  follow up therapy are one component of a multi-disciplinary discharge planning process, led by the attending physician.  Recommendations may be updated based on patient status, additional functional criteria and insurance authorization.    Recommendations  Diet recommendations: Dysphagia 3 (mechanical soft);Thin liquid Liquids provided via: Straw;Cup Medication Administration: Crushed with puree Supervision: Full supervision/cueing for compensatory strategies Compensations: Minimize environmental distractions;Slow rate;Small sips/bites;Other (Comment) Postural Changes and/or Swallow Maneuvers: Seated upright 90 degrees;Upright 30-60 min after meal                  Oral care BID   Frequent or constant Supervision/Assistance Dysphagia, oropharyngeal phase (R13.12);Cognitive communication deficit (M58.158)     Continue with current plan of care     Damien Blumenthal, M.A., CF-SLP Speech Language Pathology, Acute Rehabilitation Services  Secure Chat preferred [phone]   02/03/2023, 5:14 PM

## 2023-02-04 DIAGNOSIS — I619 Nontraumatic intracerebral hemorrhage, unspecified: Secondary | ICD-10-CM | POA: Diagnosis not present

## 2023-02-04 DIAGNOSIS — L02211 Cutaneous abscess of abdominal wall: Secondary | ICD-10-CM | POA: Diagnosis not present

## 2023-02-04 LAB — COMPREHENSIVE METABOLIC PANEL
ALT: 31 U/L (ref 0–44)
AST: 38 U/L (ref 15–41)
Albumin: 2.3 g/dL — ABNORMAL LOW (ref 3.5–5.0)
Alkaline Phosphatase: 64 U/L (ref 38–126)
Anion gap: 9 (ref 5–15)
BUN: 17 mg/dL (ref 8–23)
CO2: 23 mmol/L (ref 22–32)
Calcium: 9 mg/dL (ref 8.9–10.3)
Chloride: 103 mmol/L (ref 98–111)
Creatinine, Ser: 0.64 mg/dL (ref 0.44–1.00)
GFR, Estimated: >60 mL/min (ref >60)
Glucose, Bld: 123 mg/dL — ABNORMAL HIGH (ref 70–99)
Potassium: 5.2 mmol/L — ABNORMAL HIGH (ref 3.5–5.1)
Sodium: 135 mmol/L (ref 135–145)
Total Bilirubin: 0.8 mg/dL (ref 0.0–1.2)
Total Protein: 7 g/dL (ref 6.5–8.1)

## 2023-02-04 LAB — GLUCOSE, CAPILLARY
Glucose-Capillary: 121 mg/dL — ABNORMAL HIGH (ref 70–99)
Glucose-Capillary: 131 mg/dL — ABNORMAL HIGH (ref 70–99)
Glucose-Capillary: 148 mg/dL — ABNORMAL HIGH (ref 70–99)

## 2023-02-04 LAB — CBC
HCT: 30.7 % — ABNORMAL LOW (ref 36.0–46.0)
Hemoglobin: 9.8 g/dL — ABNORMAL LOW (ref 12.0–15.0)
MCH: 26.5 pg (ref 26.0–34.0)
MCHC: 31.9 g/dL (ref 30.0–36.0)
MCV: 83 fL (ref 80.0–100.0)
Platelets: 303 10*3/uL (ref 150–400)
RBC: 3.7 MIL/uL — ABNORMAL LOW (ref 3.87–5.11)
RDW: 16.4 % — ABNORMAL HIGH (ref 11.5–15.5)
WBC: 8.4 10*3/uL (ref 4.0–10.5)
nRBC: 0 % (ref 0.0–0.2)

## 2023-02-04 NOTE — Plan of Care (Signed)
  Problem: Nutrition: Goal: Risk of aspiration will decrease Outcome: Progressing   Problem: Education: Goal: Knowledge of disease or condition will improve Outcome: Progressing Goal: Knowledge of secondary prevention will improve (MUST DOCUMENT ALL) Outcome: Progressing Goal: Knowledge of patient specific risk factors will improve Alonso N/A or DELETE if not current risk factor) Outcome: Progressing

## 2023-02-04 NOTE — Progress Notes (Signed)
 Progress Note   Patient: Jocelyn [name] FMW:[id number] DOB: 05/12/1931 DOA: 01/14/2023     21 DOS: the patient was seen and examined on 02/04/2023   Brief hospital course: 89 y.o. female  has a past medical history of Arthritis of both knees (08/01/2015), Colitis, Essential hypertension, benign (07/28/2014), GERD (gastroesophageal reflux disease), iron deficiency anemia (09/02/2015), Hyperlipidemia, Hypertension, Lumbar pain, Medicare annual wellness visit, subsequent (04/07/2017), OSA on CPAP (08/01/2015), Overweight (BMI 25.0-29.9) (07/10/2013), Prediabetes (09/02/2015), and SOB (shortness of breath) (07/10/2013)., atrial fibrillation (on Eliquis ) who was recently discharged from the Stroke service at [facility] on 12/11 after management of an acute right MCA territory infarct in the setting of atrial fibrillation. She had presented at that time with left-sided weakness and confusion. Imaging had revealed a right A1 and M2 occlusion and she underwent successful mechanical thrombectomy, with TICI 3 revascularization. which was successful.   Follow up MRI after VIR, showed scattered acute right MCA distribution infarcts involving the right caudate and lentiform nuclei, right insula, and overlying right cerebral hemisphere, small volume petechial hemorrhage at the posterior right frontal region. MRA showed interval revascularization of previously seen clot at the right ICA terminus and right M2 segment; focal moderate distal right P2 stenosis. 2D Echo showed EF 60 to 65%, LV severe concentric hypertrophy, LA severely dilated.   Her Eliquis  was restarted on 12/10 for her chronic A-fib. She was initially unable to pass her swallow study, and PEG tube was inserted on 12/9 with tube feeding started, although patient did pass swallow study afterwards and was beginning to take foods by mouth at the time of her discharge. She did have some pain in her left knee, which was successfully treated with lidocaine  patches. She was  discharged to a SNF.   She re-presented to the ED at [facility] on Friday from [facility] rehab facility for acute stroke symptoms. LKN was 1830. She had been at [facility] for LUE weakness. Per EMS she had new onset L side facial droop, numbness to L arm, and significantly increased weakness to L arm. CTH showed interval development of scattered subarachnoid hemorrhage in the right sylvian fissure. Teleneurology was consulted. NIHSS was 17. Teleneurology advised DOAC reversal with KCentra , BP management, EEG for observed facial twitching and admission as well as Neurosurgery consult. Neurosurgery recommended Keppra  500 mg. CTA showed no LVO; atheromatous plaque was again noted. 3-4 mm outpouchings extending posteriorly and inferiorly from both supraclinoid ICAs were noted, which Radiology felt could reflect small aneurysms versus vascular infundibula; the outpouchings were similar in appearance compared to recent MRA from 12/30/2022.  Assessment and Plan: Subarachnoid hemorrhage - possible cerebral aneurysm Noted at time of this readmission - occurred while on Eliquis  therapy - imaging has suggested outpouchings extending posteriorly and inferiorly from both supraclinoid ICAs which could reflect small aneurysms - these were noted on CTa as well as MRa - yearly MRa is recommended in follow-up by the Stroke Team. Per Neurology note, now not on antithrombotic. -Outpatient follow-up with neurology.   Recent CVA Admitted to the hospital 11/28-12/12/2022 for treatment of a right MCA territory infarct felt to be cardioembolic due to A-fib - an acute clot was noted at the right terminal ICA and the patient underwent intervention with subsequent revascularization   Abdominal wall abscess General Surgery has been following with I&D performed 12/16 with Penrose drain placement and again 12/19 with placement of a second Penrose drain -wound care per General Surgery - IV antibiotic therapy resumed.  -pt taken to OR 12/27  for I/D. WBC normalized. thus far Enterobacter cloacae and Streptococcus aginosis noted out of cultures. -ID consulted for antibiotic duration and recommendations and antibiotics changed to oral Bactrim  and amoxicillin  through January 5 per ID recommendations. -General Surgery following and cleared patient for discharge with close outpatient follow-up.  -Family has requested close monitoring until completion of antibiotics. Remains afebrile   Enterobacter bacteremia Noted on 1 of 2 blood cultures drawn 12/15 - third-generation cephalosporin and Zosyn resistant - had earlier completed 7 full days of cefepime  therapy -Repeat blood cx neg.   Aspiration pneumonia Patient initially was on azithromycin  which was subsequently discontinued as patient was placed on meropenem  for treatment of abdominal wall abscess.  -Patient also followed by speech therapy was placed on dysphagia 1 diet and upgrade to a dysphagia 2 diet which she was tolerating.   -Patient placed on aspiration precautions.   -Patient improved clinically, remained afebrile, oxygen sats noted to be 96 to 98% on room air by day of discharge.    Oral candidiasis Patient maintained on nystatin  swish/swallow and will be discharged on 7 more days of nystatin  swish and swallow.   Dysphagia due to stroke PEG tube originally placed 12/9 during prior admission - this became dislodged 12/15 with the tract temporarily preserved using a Foley catheter which was subsequently exchanged for a replacement PEG tube in IR 12/16 -Being followed by SLP given concerns of aspiration. Now clear for dysphagia 3 diet -Outpatient follow-up with speech therapy at facility..   Abnormal EEG Routine EEG 12/14 raised the question of cortical dysfunction arising from the right hemisphere but with no evidence of seizure activity - long-term EEG revealed no evidence of active seizure activity. -Patient was seen in consultation by teleneurology.   HTN -Patient  maintained on Norvasc  during the hospitalization with good blood pressure control.   -Patient is Avapro , HCTZ and spironolactone  were held and will be discontinued on discharge.   -On follow-up for further blood pressure control is needed may consider resuming patient is Avapro  and spironolactone .     Chronic atrial fibrillation -Rate controlled.   -Eliquis  discontinued due to [facility] - rate controlled   Transaminitis Statin on hold due to transaminitis which improved during the hospitalization and had resolved by day of discharge.   -Statin was held due to elevated transaminitis and will be resumed on discharge.   -Outpatient follow-up.    HLD LDL 33 - statin discontinued 12/17 due to elevated LFTs -Transaminitis improved and had resolved by day of discharge. -Statin will be resumed on discharge. -Outpatient follow-up.   Pressure injury, Pressure Injury 01/21/23 Sacrum Stage 2 -  Partial thickness loss of dermis presenting as a shallow open injury with a red, pink wound bed without slough. red open sore (Active)  01/21/23 0800  Location: Sacrum  Location Orientation:   Staging: Stage 2 -  Partial thickness loss of dermis presenting as a shallow open injury with a red, pink wound bed without slough.  Wound Description (Comments): red open sore  Present on Admission: No     Pressure Injury 01/21/23 Heel Left;Posterior Deep Tissue Pressure Injury - Purple or maroon localized area of discolored intact skin or blood-filled blister due to damage of underlying soft tissue from pressure and/or shear. purplish area (Active)  01/21/23 2000  Location: Heel  Location Orientation: Left;Posterior  Staging: Deep Tissue Pressure Injury - Purple or maroon localized area of discolored intact skin or blood-filled blister due to damage of underlying soft tissue from pressure and/or shear.  Wound Description (Comments): purplish area  Present on Admission: -- (unknown, no documentation found)   B AFO's  are in place       Subjective: No complaints this AM  Physical Exam: Vitals:   02/04/23 0002 02/04/23 0342 02/04/23 0808 02/04/23 1233  BP: (!) 135/106 129/76 (!) 145/65 (!) 146/69  Pulse: 91 95 94 91  Resp: 18 16 15 16   Temp: 98.6 F (37 C) 99 F (37.2 C) 98.6 F (37 C) 97.8 F (36.6 C)  TempSrc: Oral  Oral Oral  SpO2: 97% 100% 99% 100%  Weight:      Height:       General exam: Conversant, in no acute distress Respiratory system: normal chest rise, clear, no audible wheezing Cardiovascular system: regular rhythm, s1-s2 Gastrointestinal system: Nondistended, nontender, pos BS Central nervous system: No seizures, no tremors Extremities: No cyanosis, no joint deformities, B AFO in place Skin: No rashes, no pallor Psychiatry: Affect normal // no auditory hallucinations   Data Reviewed:  Labs reviewed: Na 135, K 5.2, Cr 0.64, WBC 8.4, Hgb 9.8, Plts 303  Family Communication: Pt in room, family over phone  Disposition: Status is: Inpatient Remains inpatient appropriate because: severity of illness  Planned Discharge Destination: Skilled nursing facility     Author: Garnette Pelt, MD 02/04/2023 2:52 PM  For on call review [email or web address].

## 2023-02-04 NOTE — Plan of Care (Signed)
  Problem: Education: Goal: Knowledge of disease or condition will improve Outcome: Progressing Goal: Knowledge of secondary prevention will improve (MUST DOCUMENT ALL) Outcome: Progressing Goal: Knowledge of patient specific risk factors will improve Alonso N/A or DELETE if not current risk factor) Outcome: Progressing   Problem: Education: Goal: Knowledge of disease or condition will improve Outcome: Progressing Goal: Knowledge of secondary prevention will improve (MUST DOCUMENT ALL) Outcome: Progressing Goal: Knowledge of patient specific risk factors will improve Alonso N/A or DELETE if not current risk factor) Outcome: Progressing   Problem: Nutrition: Goal: Risk of aspiration will decrease Outcome: Progressing

## 2023-02-05 DIAGNOSIS — I619 Nontraumatic intracerebral hemorrhage, unspecified: Secondary | ICD-10-CM | POA: Diagnosis not present

## 2023-02-05 DIAGNOSIS — L02211 Cutaneous abscess of abdominal wall: Secondary | ICD-10-CM | POA: Diagnosis not present

## 2023-02-05 LAB — COMPREHENSIVE METABOLIC PANEL
ALT: 28 U/L (ref 0–44)
AST: 30 U/L (ref 15–41)
Albumin: 2.3 g/dL — ABNORMAL LOW (ref 3.5–5.0)
Alkaline Phosphatase: 62 U/L (ref 38–126)
Anion gap: 9 (ref 5–15)
BUN: 16 mg/dL (ref 8–23)
CO2: 25 mmol/L (ref 22–32)
Calcium: 9.1 mg/dL (ref 8.9–10.3)
Chloride: 101 mmol/L (ref 98–111)
Creatinine, Ser: 0.6 mg/dL (ref 0.44–1.00)
GFR, Estimated: >60 mL/min (ref >60)
Glucose, Bld: 123 mg/dL — ABNORMAL HIGH (ref 70–99)
Potassium: 5.1 mmol/L (ref 3.5–5.1)
Sodium: 135 mmol/L (ref 135–145)
Total Bilirubin: 0.5 mg/dL (ref 0.0–1.2)
Total Protein: 6.9 g/dL (ref 6.5–8.1)

## 2023-02-05 LAB — CBC
HCT: 29.8 % — ABNORMAL LOW (ref 36.0–46.0)
Hemoglobin: 9.5 g/dL — ABNORMAL LOW (ref 12.0–15.0)
MCH: 26.7 pg (ref 26.0–34.0)
MCHC: 31.9 g/dL (ref 30.0–36.0)
MCV: 83.7 fL (ref 80.0–100.0)
Platelets: 336 10*3/uL (ref 150–400)
RBC: 3.56 MIL/uL — ABNORMAL LOW (ref 3.87–5.11)
RDW: 16.4 % — ABNORMAL HIGH (ref 11.5–15.5)
WBC: 9.2 10*3/uL (ref 4.0–10.5)
nRBC: 0 % (ref 0.0–0.2)

## 2023-02-05 LAB — GLUCOSE, CAPILLARY
Glucose-Capillary: 128 mg/dL — ABNORMAL HIGH (ref 70–99)
Glucose-Capillary: 142 mg/dL — ABNORMAL HIGH (ref 70–99)
Glucose-Capillary: 123 mg/dL — ABNORMAL HIGH (ref 70–99)
Glucose-Capillary: 117 mg/dL — ABNORMAL HIGH (ref 70–99)

## 2023-02-05 NOTE — Progress Notes (Signed)
 Speech Language Pathology Treatment: Dysphagia  Patient Details Name: Jocelyn [name] MRN: [id number] DOB: 1931-02-15 Today's Date: 02/05/2023 Time: 9157-9145 SLP Time Calculation (min) (ACUTE ONLY): 12 min  Assessment / Plan / Recommendation Clinical Impression  PT observed with breakfast tray due to nursing reports of difficulty with masticaiton upgraded diet texture. Pt masticated eggs and potatoes thoroughly and without oral residue but had increased difficulty with breakfast meat and toast. These resulted in significantly prolonged mastication with increased oral residue. A liquid wash was not as effective as previously noted with these difficult textures. Given pt's dependence for feeding and edentulism, recommend downgrading to Dys 2 texture solids with thin liquids. SLP will continue following to target dysphagia and cognitive-linguistic goals as able.    HPI HPI: 89 yo female presenting back to the hospital (transfer from ED at [facility]) from SNF on 12/14 with acute stroke symptoms and per EMS she had a new onset of left sided facial droop, numbness to left arm and significantly increased weakness of left arm. CTH showed showed interval development of scattered subarachnoid hemorrhage in the right sylvian fissure. Neurosurgery consulted and recommended Keppra  500mg . WBC elevated and trauma was consulted to evaluate PEG tube with CT abdomen pending. She was recently admitted 11/27-12/11 with L sided weakness and R MCA occlusion s/p IR revascularization with complicated arterial cannulation requiring multiple sticks. Intubated 11/28-11/29. PMH includes: GERD, HTN, CHF, AFIB RVR on eliquis , HLD, OSA. MBSS 01/02/23 was limited due to oral holding, repeat MBS on 12/11 showed moderate oral dysphagia and suspected pharyngoesophageal dysphagia with prominent cricopharyngeaus and recommened PO diet of Dys 1 (puree), thin liquids; ST f/u for dysphagia tx/potential upgrade to diet.      SLP Plan  Continue  with current plan of care      Recommendations for follow up therapy are one component of a multi-disciplinary discharge planning process, led by the attending physician.  Recommendations may be updated based on patient status, additional functional criteria and insurance authorization.    Recommendations  Diet recommendations: Dysphagia 2 (fine chop);Thin liquid Liquids provided via: Straw;Cup Medication Administration: Crushed with puree Supervision: Full supervision/cueing for compensatory strategies Compensations: Minimize environmental distractions;Slow rate;Small sips/bites;Other (Comment) Postural Changes and/or Swallow Maneuvers: Seated upright 90 degrees;Upright 30-60 min after meal                  Oral care BID   Frequent or constant Supervision/Assistance Dysphagia, oropharyngeal phase (R13.12);Cognitive communication deficit (M58.158)     Continue with current plan of care     Damien Blumenthal, M.A., CF-SLP Speech Language Pathology, Acute Rehabilitation Services  Secure Chat preferred [phone]   02/05/2023, 9:15 AM

## 2023-02-05 NOTE — Progress Notes (Signed)
 Progress Note   Patient: Jocelyn [name] FMW:[id number] DOB: 01/09/1932 DOA: 01/14/2023     22 DOS: the patient was seen and examined on 02/05/2023   Brief hospital course: 89 y.o. female  has a past medical history of Arthritis of both knees (08/01/2015), Colitis, Essential hypertension, benign (07/28/2014), GERD (gastroesophageal reflux disease), iron deficiency anemia (09/02/2015), Hyperlipidemia, Hypertension, Lumbar pain, Medicare annual wellness visit, subsequent (04/07/2017), OSA on CPAP (08/01/2015), Overweight (BMI 25.0-29.9) (07/10/2013), Prediabetes (09/02/2015), and SOB (shortness of breath) (07/10/2013)., atrial fibrillation (on Eliquis ) who was recently discharged from the Stroke service at [facility] on 12/11 after management of an acute right MCA territory infarct in the setting of atrial fibrillation. She had presented at that time with left-sided weakness and confusion. Imaging had revealed a right A1 and M2 occlusion and she underwent successful mechanical thrombectomy, with TICI 3 revascularization. which was successful.   Follow up MRI after VIR, showed scattered acute right MCA distribution infarcts involving the right caudate and lentiform nuclei, right insula, and overlying right cerebral hemisphere, small volume petechial hemorrhage at the posterior right frontal region. MRA showed interval revascularization of previously seen clot at the right ICA terminus and right M2 segment; focal moderate distal right P2 stenosis. 2D Echo showed EF 60 to 65%, LV severe concentric hypertrophy, LA severely dilated.   Her Eliquis  was restarted on 12/10 for her chronic A-fib. She was initially unable to pass her swallow study, and PEG tube was inserted on 12/9 with tube feeding started, although patient did pass swallow study afterwards and was beginning to take foods by mouth at the time of her discharge. She did have some pain in her left knee, which was successfully treated with lidocaine  patches. She was  discharged to a SNF.   She re-presented to the ED at [facility] on Friday from [facility] rehab facility for acute stroke symptoms. LKN was 1830. She had been at [facility] for LUE weakness. Per EMS she had new onset L side facial droop, numbness to L arm, and significantly increased weakness to L arm. CTH showed interval development of scattered subarachnoid hemorrhage in the right sylvian fissure. Teleneurology was consulted. NIHSS was 17. Teleneurology advised DOAC reversal with KCentra , BP management, EEG for observed facial twitching and admission as well as Neurosurgery consult. Neurosurgery recommended Keppra  500 mg. CTA showed no LVO; atheromatous plaque was again noted. 3-4 mm outpouchings extending posteriorly and inferiorly from both supraclinoid ICAs were noted, which Radiology felt could reflect small aneurysms versus vascular infundibula; the outpouchings were similar in appearance compared to recent MRA from 12/30/2022.  Assessment and Plan: Subarachnoid hemorrhage - possible cerebral aneurysm Noted at time of this readmission - occurred while on Eliquis  therapy - imaging has suggested outpouchings extending posteriorly and inferiorly from both supraclinoid ICAs which could reflect small aneurysms - these were noted on CTa as well as MRa - yearly MRa is recommended in follow-up by the Stroke Team. Per Neurology note, now not on antithrombotic. -Outpatient follow-up with neurology.   Recent CVA Admitted to the hospital 11/28-12/12/2022 for treatment of a right MCA territory infarct felt to be cardioembolic due to A-fib - an acute clot was noted at the right terminal ICA and the patient underwent intervention with subsequent revascularization   Abdominal wall abscess General Surgery has been following with I&D performed 12/16 with Penrose drain placement and again 12/19 with placement of a second Penrose drain -wound care per General Surgery - IV antibiotic therapy resumed.  -pt taken to OR 12/27  for I/D. WBC normalized. thus far Enterobacter cloacae and Streptococcus aginosis noted out of cultures. -ID consulted for antibiotic duration and recommendations and antibiotics changed to oral Bactrim  and amoxicillin  through January 5 per ID recommendations. -General Surgery had been following and cleared patient for discharge with close outpatient follow-up.  -Family has requested close monitoring until completion of antibiotics. Pt remains afebrile and stable   Enterobacter bacteremia Noted on 1 of 2 blood cultures drawn 12/15 - third-generation cephalosporin and Zosyn resistant - had earlier completed 7 full days of cefepime  therapy -Repeat blood cx neg.   Aspiration pneumonia Patient initially was on azithromycin  which was subsequently discontinued as patient was placed on meropenem  for treatment of abdominal wall abscess.  -Patient also followed by speech therapy was placed on dysphagia 1 diet and upgrade to a dysphagia 2 diet which she was tolerating.   -Patient placed on aspiration precautions.   -Patient improved clinically, remained afebrile   Oral candidiasis Patient maintained on nystatin  swish/swallow, plan to d/c on 7 more days of nystatin  swish and swallow.   Dysphagia due to stroke PEG tube originally placed 12/9 during prior admission - this became dislodged 12/15 with the tract temporarily preserved using a Foley catheter which was subsequently exchanged for a replacement PEG tube in IR 12/16 -Being followed by SLP given concerns of aspiration. Now clear for dysphagia 2 diet -Outpatient follow-up with speech therapy at facility..   Abnormal EEG Routine EEG 12/14 raised the question of cortical dysfunction arising from the right hemisphere but with no evidence of seizure activity - long-term EEG revealed no evidence of active seizure activity. -Patient was seen in consultation by teleneurology.   HTN -Patient maintained on Norvasc  during the hospitalization with good  blood pressure control.   -Patient is Avapro , HCTZ and spironolactone  were held and will be discontinued on discharge.   -On follow-up for further blood pressure control is needed may consider resuming patient is Avapro  and spironolactone .     Chronic atrial fibrillation -Rate controlled.   -Eliquis  discontinued due to [facility] - rate controlled   Transaminitis Statin on hold due to transaminitis which improved during the hospitalization and had resolved by day of discharge.   -Statin was held due to elevated transaminitis and will be resumed on discharge.   -Outpatient follow-up.    HLD LDL 33 - statin discontinued 12/17 due to elevated LFTs -Transaminitis improved and had resolved by day of discharge. -Statin will be resumed on discharge. -Outpatient follow-up.   Pressure injury, Pressure Injury 01/21/23 Sacrum Stage 2 -  Partial thickness loss of dermis presenting as a shallow open injury with a red, pink wound bed without slough. red open sore (Active)  01/21/23 0800  Location: Sacrum  Location Orientation:   Staging: Stage 2 -  Partial thickness loss of dermis presenting as a shallow open injury with a red, pink wound bed without slough.  Wound Description (Comments): red open sore  Present on Admission: No     Pressure Injury 01/21/23 Heel Left;Posterior Deep Tissue Pressure Injury - Purple or maroon localized area of discolored intact skin or blood-filled blister due to damage of underlying soft tissue from pressure and/or shear. purplish area (Active)  01/21/23 2000  Location: Heel  Location Orientation: Left;Posterior  Staging: Deep Tissue Pressure Injury - Purple or maroon localized area of discolored intact skin or blood-filled blister due to damage of underlying soft tissue from pressure and/or shear.  Wound Description (Comments): purplish area  Present on Admission: -- (unknown,  no documentation found)   B AFO's are in place       Subjective: Without complaints this  AM. States feeling better  Physical Exam: Vitals:   02/04/23 1233 02/04/23 1556 02/05/23 0728 02/05/23 1201  BP: (!) 146/69 (!) 142/61 (!) 143/69 118/71  Pulse: 91 (!) 106 79 (!) 104  Resp: 16 16 16 18   Temp: 97.8 F (36.6 C) 97.9 F (36.6 C) 98.1 F (36.7 C) 98 F (36.7 C)  TempSrc: Oral Oral Oral Oral  SpO2: 100% 100% 100% 100%  Weight:      Height:       General exam: Awake, laying in bed, in nad Respiratory system: Normal respiratory effort, no wheezing Cardiovascular system: regular rate, s1, s2 Gastrointestinal system: Soft, nondistended, positive BS Central nervous system: CN2-12 grossly intact, strength intact Extremities: Perfused, no clubbing Skin: Normal skin turgor, no notable skin lesions seen Psychiatry: Mood normal // no visual hallucinations   Data Reviewed:  Labs reviewed: Na 135, K 5.1, Cr 0.60, WBC 9.2, Hgb 9.5, Plts 336  Family Communication: Pt in room, family over phone  Disposition: Status is: Inpatient Remains inpatient appropriate because: severity of illness  Planned Discharge Destination: Skilled nursing facility     Author: Garnette Pelt, MD 02/05/2023 2:44 PM  For on call review [email or web address].

## 2023-02-06 DIAGNOSIS — I614 Nontraumatic intracerebral hemorrhage in cerebellum: Secondary | ICD-10-CM

## 2023-02-06 LAB — COMPREHENSIVE METABOLIC PANEL
ALT: 28 U/L (ref 0–44)
AST: 44 U/L — ABNORMAL HIGH (ref 15–41)
Albumin: 2.3 g/dL — ABNORMAL LOW (ref 3.5–5.0)
Alkaline Phosphatase: 64 U/L (ref 38–126)
Anion gap: 7 (ref 5–15)
BUN: 14 mg/dL (ref 8–23)
CO2: 26 mmol/L (ref 22–32)
Calcium: 9.3 mg/dL (ref 8.9–10.3)
Chloride: 101 mmol/L (ref 98–111)
Creatinine, Ser: 0.61 mg/dL (ref 0.44–1.00)
GFR, Estimated: >60 mL/min (ref >60)
Glucose, Bld: 119 mg/dL — ABNORMAL HIGH (ref 70–99)
Potassium: 5.2 mmol/L — ABNORMAL HIGH (ref 3.5–5.1)
Sodium: 134 mmol/L — ABNORMAL LOW (ref 135–145)
Total Bilirubin: 0.5 mg/dL (ref 0.0–1.2)
Total Protein: 6.8 g/dL (ref 6.5–8.1)

## 2023-02-06 LAB — CBC
HCT: 29.6 % — ABNORMAL LOW (ref 36.0–46.0)
Hemoglobin: 9.4 g/dL — ABNORMAL LOW (ref 12.0–15.0)
MCH: 26.6 pg (ref 26.0–34.0)
MCHC: 31.8 g/dL (ref 30.0–36.0)
MCV: 83.6 fL (ref 80.0–100.0)
Platelets: 319 10*3/uL (ref 150–400)
RBC: 3.54 MIL/uL — ABNORMAL LOW (ref 3.87–5.11)
RDW: 16.3 % — ABNORMAL HIGH (ref 11.5–15.5)
WBC: 9.7 10*3/uL (ref 4.0–10.5)
nRBC: 0 % (ref 0.0–0.2)

## 2023-02-06 LAB — GLUCOSE, CAPILLARY
Glucose-Capillary: 111 mg/dL — ABNORMAL HIGH (ref 70–99)
Glucose-Capillary: 133 mg/dL — ABNORMAL HIGH (ref 70–99)
Glucose-Capillary: 132 mg/dL — ABNORMAL HIGH (ref 70–99)
Glucose-Capillary: 134 mg/dL — ABNORMAL HIGH (ref 70–99)

## 2023-02-06 NOTE — Progress Notes (Signed)
 Pt & family in room. Pt is not wearing CPAP due to feeding tube.

## 2023-02-06 NOTE — Progress Notes (Signed)
 Physical Therapy Treatment Patient Details Name: Jocelyn [name] MRN: [id number] DOB: [birth year] Today's Date: 02/06/2023   History of Present Illness Pt is an 89 y.o. female who presented to the ED 12/13 due to LUE weakness, left facial droop and CTH showed development of scattered SAH. 12/15 dislodged PEG tube. 12/16 incision and drainage of left abdominal wall infection PEG replaced. 12/19 additional I&D of abdominal wall performed. Recent hospitalization 11/27-12/11 due to R MCA ischemic stroke for which she underwent IR thrombectomy and PEG placement.  She was discharged to [facility] 12/11. PMH: HTN, CHF, a fib with RVR on eliquis , HLD, OSA    PT Comments  Patient alert and more participatory today as not as fearful of movement. Able to progress to sitting EOB with propping on rt forearm with CGA for up to 90 seconds at a time. Facilitated Left trunk stretch with lateral lean and rt rotation (assisted). Pt only began pushing (posteriorly) when attempting to stand with +2 assist. May be good to try stedy for standing next session (help pt come forward and ?less fearful of falling).     If plan is discharge home, recommend the following: Two people to help with walking and/or transfers;Direct supervision/assist for medications management;Assist for transportation;Two people to help with bathing/dressing/bathroom;Assistance with cooking/housework;Assistance with feeding;Direct supervision/assist for financial management;Help with stairs or ramp for entrance;Supervision due to cognitive status   Can travel by private vehicle     No  Equipment Recommendations  None recommended by PT    Recommendations for Other Services       Precautions / Restrictions Precautions Precautions: Fall Precaution Comments: PEG tube, abd binder Restrictions Weight Bearing Restrictions Per Provider Order: No     Mobility  Bed Mobility Overal bed mobility: Needs Assistance Bed Mobility: Sit to Supine, Rolling,  Sidelying to Sit Rolling: Max assist, Used rails Sidelying to sit: HOB elevated, +2 for physical assistance, Max assist, Used rails (up from rt side, pt using RUE to assist) Supine to sit: Max assist, +2 for physical assistance Sit to supine: Max assist, +2 for physical assistance Sit to sidelying: Max assist, +2 for physical assistance General bed mobility comments: using RUE purposefully to assist with bed mobility    Transfers                   General transfer comment: Positioned pt to attempt sit to stand with +2 assist, however pt began pushing shoulders backwards and would not come forward enough to attempt standing despite several minutes of attempting to reposition    Ambulation/Gait                   Stairs             Wheelchair Mobility     Tilt Bed    Modified Rankin (Stroke Patients Only) Modified Rankin (Stroke Patients Only) Pre-Morbid Rankin Score: Severe disability Modified Rankin: Severe disability     Balance Overall balance assessment: Needs assistance Sitting-balance support: Feet supported, Single extremity supported Sitting balance-Leahy Scale: Poor Sitting balance - Comments: able to slightly elevate HOB and place pillow under RUE and pt actually propped on RUE without pushing and able to maintain sitting balance with CGA for up to 90 seconds at a time; worked on rt rotation to stretch left side while in sitting with good tolerance (pt not resisting movement) Postural control: Left lateral lean  Cognition Arousal: Alert Behavior During Therapy: Flat affect   Area of Impairment: Attention, Following commands, Memory, Safety/judgement, Awareness, Problem solving                 Orientation Level:  (NT) Current Attention Level: Sustained Memory: Decreased recall of precautions, Decreased short-term memory Following Commands: Follows one step commands  consistently Safety/Judgement: Decreased awareness of safety, Decreased awareness of deficits Awareness: Intellectual Problem Solving: Slow processing, Decreased initiation, Difficulty sequencing, Requires verbal cues, Requires tactile cues General Comments: pt a pusher, less pushing today and less fearful of movement; able to attend to tasks for slightly longer due to less fear        Exercises Other Exercises Other Exercises: seated rt rotation with pt not resisting/pushing to avoid    General Comments        Pertinent Vitals/Pain Pain Assessment Pain Assessment: Faces Faces Pain Scale: Hurts little more Pain Location: bilateral knees Pain Descriptors / Indicators: Grimacing, Discomfort Pain Intervention(s): Limited activity within patient's tolerance, Monitored during session, Repositioned    Home Living                          Prior Function            PT Goals (current goals can now be found in the care plan section) Acute Rehab PT Goals Patient Stated Goal: not stated Time For Goal Achievement: 02/13/23 Potential to Achieve Goals: Fair Progress towards PT goals: Progressing toward goals    Frequency    Min 1X/week      PT Plan      Co-evaluation              AM-PAC PT 6 Clicks Mobility   Outcome Measure  Help needed turning from your back to your side while in a flat bed without using bedrails?: A Lot Help needed moving from lying on your back to sitting on the side of a flat bed without using bedrails?: Total Help needed moving to and from a bed to a chair (including a wheelchair)?: Total Help needed standing up from a chair using your arms (e.g., wheelchair or bedside chair)?: Total Help needed to walk in hospital room?: Total Help needed climbing 3-5 steps with a railing? : Total 6 Click Score: 7    End of Session Equipment Utilized During Treatment: Gait belt Activity Tolerance: Patient tolerated treatment well Patient left:  with call bell/phone within reach;in bed;with nursing/sitter in room Nurse Communication: Mobility status PT Visit Diagnosis: Other abnormalities of gait and mobility (R26.89);Hemiplegia and hemiparesis;Other symptoms and signs involving the nervous system (R29.898) Hemiplegia - Right/Left: Left Hemiplegia - dominant/non-dominant: Non-dominant Hemiplegia - caused by: Nontraumatic intracerebral hemorrhage     Time: 1012-1035 PT Time Calculation (min) (ACUTE ONLY): 23 min  Charges:    $Therapeutic Activity: 23-37 mins PT General Charges $$ ACUTE PT VISIT: 1 Visit                      Macario RAMAN, PT Acute Rehabilitation Services  Office [phone]    Macario SHAUNNA Soja 02/06/2023, 11:07 AM

## 2023-02-06 NOTE — Progress Notes (Signed)
 PROGRESS NOTE    MISHELLE HASSAN  FMW:[id number] DOB: November 03, 1931 DOA: 01/14/2023 PCP: [name], PA-C    Brief Narrative:  89 year old with hypertension, GERD, hyperlipidemia, prediabetes history of A-fib who was on Eliquis  recently admitted to the hospital with acute right MCA territory infarct in the setting of atrial fibrillation.  Patient had PEG tube.  She was discharged to a SNF.  Came back to the [facility] for acute stroke symptoms again.  CT scan showed interval development of scattered subarachnoid hemorrhage in the right sylvian fissure.  Kcentra  was administered and Eliquis  was reversed.  Neurosurgery was consulted.  Patient was transferred to [facility].  Since then remains in the hospital.  Waiting for placement today.  Subjective: Patient seen and examined.  Poor historian but denies any complaints.  Tolerating dysphagia 2 diet.  No family at the bedside.  Nursing has no concerns. Assessment & Plan:   Subarachnoid hemorrhage, possible cerebral aneurysm precipitated by Eliquis  therapy: Imaging suggested possible small aneurysms.  Seen by stroke team.  Currently not on any anticoagulation.  Follow-up requested. Recent admission 11/28-12/12/2022 for treatment of right MCA territory infarct that was thought to be cardioembolic due to A-fib.  Treated with intervention. Continue aggressive rehab.  Blood pressure controlled.  No antithrombotic. Allow dysphagia to diet with aspiration precautions.  Continue tube feeding until she has well-established oral intake.  Refer to SNF for rehab.  Abdominal wall abscess: I&D performed 12/16, revision 12/27.  Improved.  Enterobacter cloacae a and Streptococcus noted out of the cultures.  Treated with Bactrim  and amoxicillin .  Completed therapies. Local wound care.  Enterobacter bacteremia: 1 out of 2 blood cultures drawn on 12/15 positive.  Treated with different antibiotics.  Completed therapies.  Aspiration pneumonia: Aspiration  precautions.  Completed antibiotics.  Continues on nystatin  swish and swallow.  All-time aspiration precautions.  Essential hypertension blood pressure is stable on Norvasc  now.  Monitoring.  Chronic A-fib: Rate controlled.  Eliquis  discontinued due to subarachnoid hemorrhage.  Transaminitis: Likely due to infection.  LFTs normalized.  Will resume statin on discharge.  Pressure Injury 01/21/23 Sacrum Stage 2 -  Partial thickness loss of dermis presenting as a shallow open injury with a red, pink wound bed without slough. red open sore (Active)  01/21/23 0800  Location: Sacrum  Location Orientation:   Staging: Stage 2 -  Partial thickness loss of dermis presenting as a shallow open injury with a red, pink wound bed without slough.  Wound Description (Comments): red open sore  Present on Admission: No     Pressure Injury 01/21/23 Heel Left;Posterior Deep Tissue Pressure Injury - Purple or maroon localized area of discolored intact skin or blood-filled blister due to damage of underlying soft tissue from pressure and/or shear. purplish area (Active)  01/21/23 2000  Location: Heel  Location Orientation: Left;Posterior  Staging: Deep Tissue Pressure Injury - Purple or maroon localized area of discolored intact skin or blood-filled blister due to damage of underlying soft tissue from pressure and/or shear.  Wound Description (Comments): purplish area  Present on Admission: -- (unknown, no documentation found)        DVT prophylaxis: heparin  injection 5,000 Units Start: 01/17/23 1400 SCD's Start: 01/14/23 0129   Code Status: Full code Family Communication: Patient's daughter called and updated Disposition Plan: Status is: Inpatient Remains inpatient appropriate because: Medically stable.  Needs SNF     Consultants:  Neurology Neurosurgery General surgery  Procedures:  I&D of the abdominal wall abscess  Antimicrobials:  Completed multiple  antibiotics     Objective: Vitals:   02/06/23 0335 02/06/23 0815 02/06/23 1041 02/06/23 1201  BP: 109/66 130/62 (!) 119/59 125/78  Pulse: 98 (!) 103  87  Resp: 16 16  16   Temp: 98.4 F (36.9 C) 98.8 F (37.1 C)  98.4 F (36.9 C)  TempSrc:  Oral  Oral  SpO2: 100% 98%  99%  Weight:      Height:        Intake/Output Summary (Last 24 hours) at 02/06/2023 1232 Last data filed at 02/06/2023 0335 Gross per 24 hour  Intake --  Output 600 ml  Net -600 ml   Filed Weights   01/19/23 1300 01/21/23 0500 01/26/23 1400  Weight: 67.9 kg 69 kg 67.8 kg    Examination:  General exam: Appears calm and comfortable  Frail and debilitated.  Not in any distress.  Pleasant to interaction. Respiratory system: Clear to auscultation. Respiratory effort normal.  No added sounds. Cardiovascular system: S1 & S2 heard, RRR.  No pedal edema. Gastrointestinal system: Soft.  Nontender.  PEG tube insertion site clean and dry. Left lateral abdominal wound packed, looks clean and dry. Central nervous system: Alert and awake.  Mostly oriented.  Data Reviewed: I have personally reviewed following labs and imaging studies  CBC: Recent Labs  Lab 02/01/23 0654 02/02/23 0458 02/04/23 0611 02/05/23 0608 02/06/23 0621  WBC 9.4 9.1 8.4 9.2 9.7  NEUTROABS 5.9  --   --   --   --   HGB 9.6* 9.5* 9.8* 9.5* 9.4*  HCT 29.8* 29.5* 30.7* 29.8* 29.6*  MCV 83.5 82.9 83.0 83.7 83.6  PLT 289 264 303 336 319   Basic Metabolic Panel: Recent Labs  Lab 02/01/23 0654 02/02/23 0458 02/04/23 0611 02/05/23 0608 02/06/23 0621  NA 134* 135 135 135 134*  K 4.6 4.9 5.2* 5.1 5.2*  CL 100 102 103 101 101  CO2 25 25 23 25 26   GLUCOSE 128* 123* 123* 123* 119*  BUN 16 14 17 16 14   CREATININE 0.43* 0.53 0.64 0.60 0.61  CALCIUM  9.1 9.1 9.0 9.1 9.3   GFR: Estimated Creatinine Clearance: 44.1 mL/min (by C-G formula based on SCr of 0.61 mg/dL). Liver Function Tests: Recent Labs  Lab 01/31/23 0536 02/02/23 0458  02/04/23 0611 02/05/23 0608 02/06/23 0621  AST 29 35 38 30 44*  ALT 40 36 31 28 28   ALKPHOS 63 65 64 62 64  BILITOT 0.4 0.2 0.8 0.5 0.5  PROT 6.8 6.7 7.0 6.9 6.8  ALBUMIN  2.1* 2.1* 2.3* 2.3* 2.3*   No results for input(s): LIPASE, AMYLASE in the last 168 hours. No results for input(s): AMMONIA in the last 168 hours. Coagulation Profile: No results for input(s): INR, PROTIME in the last 168 hours. Cardiac Enzymes: No results for input(s): CKTOTAL, CKMB, CKMBINDEX, TROPONINI in the last 168 hours. BNP (last 3 results) No results for input(s): PROBNP in the last 8760 hours. HbA1C: No results for input(s): HGBA1C in the last 72 hours. CBG: Recent Labs  Lab 02/05/23 0628 02/05/23 1330 02/05/23 1532 02/05/23 2200 02/06/23 0630  GLUCAP 128* 142* 123* 117* 111*   Lipid Profile: No results for input(s): CHOL, HDL, LDLCALC, TRIG, CHOLHDL, LDLDIRECT in the last 72 hours. Thyroid  Function Tests: No results for input(s): TSH, T4TOTAL, FREET4, T3FREE, THYROIDAB in the last 72 hours. Anemia Panel: No results for input(s): VITAMINB12, FOLATE, FERRITIN, TIBC, IRON, RETICCTPCT in the last 72 hours. Sepsis Labs: No results for input(s): PROCALCITON, LATICACIDVEN in the last 168 hours.  Recent Results (from the past 240 hours)  Aerobic/Anaerobic Culture w Gram Stain (surgical/deep wound)     Status: None   Collection Time: 01/27/23  1:39 PM   Specimen: Path fluid; Body Fluid  Result Value Ref Range Status   Specimen Description ABSCESS ABDOMEN  Final   Special Requests SWABS  Final   Gram Stain   Final    ABUNDANT WBC PRESENT, PREDOMINANTLY PMN RARE GRAM POSITIVE COCCI    Culture   Final    MODERATE STREPTOCOCCUS ANGINOSIS FEW ENTEROBACTER CLOACAE NO ANAEROBES ISOLATED Performed at [facility], [address], [place], [name] [address]    Report Status 02/01/2023 FINAL  Final   Organism ID, Bacteria ENTEROBACTER  CLOACAE  Final   Organism ID, Bacteria STREPTOCOCCUS ANGINOSIS  Final      Susceptibility   Enterobacter cloacae - MIC*    CEFEPIME  0.5 SENSITIVE Sensitive     CEFTAZIDIME >=64 RESISTANT Resistant     CIPROFLOXACIN  <=0.25 SENSITIVE Sensitive     GENTAMICIN <=1 SENSITIVE Sensitive     IMIPENEM 0.5 SENSITIVE Sensitive     TRIMETH /SULFA  <=20 SENSITIVE Sensitive     PIP/TAZO >=128 RESISTANT Resistant ug/mL    * FEW ENTEROBACTER CLOACAE   Streptococcus anginosis - MIC*    PENICILLIN <=0.06 SENSITIVE Sensitive     CEFTRIAXONE  <=0.12 SENSITIVE Sensitive     ERYTHROMYCIN >=8 RESISTANT Resistant     LEVOFLOXACIN 0.5 SENSITIVE Sensitive     VANCOMYCIN  0.5 SENSITIVE Sensitive     * MODERATE STREPTOCOCCUS ANGINOSIS         Radiology Studies: No results found.      Scheduled Meds:  amLODipine   10 mg Per Tube Daily   ascorbic acid   250 mg Per Tube Daily   calcium -vitamin D   1 tablet Per Tube BID   famotidine   20 mg Per Tube Daily   ferrous sulfate   60 mg of iron Per Tube Daily   fiber supplement (BANATROL TF)  60 mL Per Tube BID   free water   100 mL Per Tube Q4H   heparin  injection (subcutaneous)  5,000 Units Subcutaneous Q8H   insulin  aspart  0-15 Units Subcutaneous Q8H   lidocaine   2 patch Transdermal Daily   liver oil-zinc  oxide   Topical TID   multivitamin  15 mL Per Tube Daily   nystatin   5 mL Mouth/Throat QID   mouth rinse  15 mL Mouth Rinse 4 times per day   Continuous Infusions:  feeding supplement (OSMOLITE 1.2 CAL) 1,000 mL (02/06/23 0050)     LOS: 23 days    Time spent: 35 minutes    Renato Applebaum, MD [facility]

## 2023-02-06 NOTE — Consult Note (Addendum)
 WOC Nurse Consult Note: Reason for Consult: Requested to assess a deep pressure injury on the left heel. Wound type: Deep pressure injury Pressure Injury POA: unknown. Measurement: 11x5cm Wound bed: 100% black, blood-filled blister on the right side, connecting to the black dry tissue on the left. Drainage (amount, consistency, odor) no odor, no drainage. Periwound: intact Dressing procedure/placement/frequency: Cover with foam dressing, change every 3 days. Pt is using already the Prevalon boots on both sides.  WOC team will follow further.   Thank-you,  Lela Holm BSN, RN, [employer], WOC  (Pager: [phone])

## 2023-02-06 NOTE — TOC Progression Note (Addendum)
 Transition of Care [facility]) - Progression Note    Patient Details  Name: Jocelyn [name] MRN: [id number] Date of Birth: 19-Sep-1931  [facility] CM/SW Contact  Harlene Sharps, LCSW Phone Number: 02/06/2023, 10:42 AM  Clinical Narrative:    CSW advised pt is medically ready to DC back to [facility] today. Prior authorization was cancelled due to pt not being medically stable. PT on to see pt and will need updated note prior to starting authorization. Medical team notified to this delay. CSW to start auth with new documentation. TOC will continue to follow.   11:20 CSW advised by MD that dtr stated she does not want pt to go back to [facility] now. This was discussed with dtr previously in admission, and no other offers were extended so dtr agreed to pt returning. CSW spoke with dtr who noted she had been agreeable but not [facility] has Covid in the building and does not want pt to return. Dtr asked CSW to send referrals to [place], [facility], [place], [facility], and [facility]. Referrals were sent. TOC will continue to follow for bed choice and auth.   1:40 CSW following for additional bed offers. CSW started auth with facility under pending contracted. Can be updated prior to DC with families bed choice. TOC will continue to follow.   2:50 CSW received a call from [place] who noted she would like referrals sent to [facility] and [facility] as well. CSW was advised that pt had only been to [facility] for a day and a half prior to being re hospitalized. Pt also has Medicaid to cover any copays. CSW advised dtr that [facility] had accepted but [facility] and [address] had declined. TOC will continue to follow.  Expected Discharge Plan: Skilled Nursing Facility Barriers to Discharge: Continued Medical Work up, English As A Second Language Teacher  Expected Discharge Plan and Services         Expected Discharge Date: 02/02/23                                     Social Determinants of Health (SDOH)  Interventions SDOH Screenings   Food Insecurity: Patient Unable To Answer (01/14/2023)  Housing: Patient Unable To Answer (01/14/2023)  Transportation Needs: Unknown (01/14/2023)  Utilities: Patient Unable To Answer (01/14/2023)  Alcohol Screen: Low Risk  (12/03/2018)  Depression (PHQ2-9): Low Risk  (12/06/2022)  Financial Resource Strain: Medium Risk (07/26/2018)  Physical Activity: Inactive (07/26/2018)  Social Connections: Moderately Isolated (07/26/2018)  Stress: No Stress Concern Present (07/26/2018)  Tobacco Use: Low Risk  (01/27/2023)    Readmission Risk Interventions     No data to display

## 2023-02-07 DIAGNOSIS — I613 Nontraumatic intracerebral hemorrhage in brain stem: Secondary | ICD-10-CM

## 2023-02-07 LAB — GLUCOSE, CAPILLARY
Glucose-Capillary: 130 mg/dL — ABNORMAL HIGH (ref 70–99)
Glucose-Capillary: 125 mg/dL — ABNORMAL HIGH (ref 70–99)

## 2023-02-07 MED ORDER — HYDROCODONE-ACETAMINOPHEN 5-325 MG PO TABS: 1.0000 | ORAL_TABLET | Freq: Four times a day (QID) | ORAL | 0 refills | Status: DC | PRN

## 2023-02-07 NOTE — Progress Notes (Signed)
 Discharge report called to [facility] at [phone], report given to receiving nurse Aurther Loft, LPN.  AVS and scripts placed in chart by Laurette Schimke, LCSW.  [facility] to transport.

## 2023-02-07 NOTE — Discharge Summary (Signed)
 Physician Discharge Summary  Jocelyn [name] FMW:[id number] DOB: 23-Feb-1931 DOA: 01/14/2023  PCP: Jocelyn Kern, PA-C  Admit date: 01/14/2023 Discharge date: 02/07/2023  Admitted From: Skilled nursing facility Disposition: Skilled nursing facility  Recommendations for Outpatient Follow-up:  Follow up with PCP in 1-2 weeks Please obtain BMP/CBC in one week Neurology follow-up outpatient  Discharge Condition: Fair CODE STATUS: Full code Diet recommendation: Dysphagia 2 diet, continue tube feeding  Discharge summary: 89 year old with hypertension, GERD, hyperlipidemia, prediabetes,  history of A-fib who was on Eliquis  recently admitted to the hospital with acute right MCA territory infarct in the setting of atrial fibrillation.  Patient had PEG tube.  She was discharged to a SNF.  Came back to the [facility] for acute stroke symptoms again.  CT scan showed interval development of scattered subarachnoid hemorrhage in the right sylvian fissure.  Kcentra  was administered and Eliquis  was reversed.  Neurosurgery was consulted.  Patient was transferred to [facility].  Since then remains in the hospital with different medical issues.  Abdominal wall wound.  Waiting for skilled nursing facility placement.  Currently medically stable.  Assessment & Plan:   Subarachnoid hemorrhage, possible cerebral aneurysm precipitated by Eliquis  therapy: Imaging suggested possible small aneurysms.  Seen by stroke team.  Currently not on any anticoagulation.  Follow-up requested. Recent admission 11/28-12/12/2022 for treatment of right MCA territory infarct that was thought to be cardioembolic due to A-fib.  Treated with intervention. Continue aggressive rehab.  Blood pressure controlled.  No antithrombotic. Allow dysphagia 2 diet with aspiration precautions.  Continue tube feeding until she has well-established oral intake.  Refer to SNF for rehab.   Abdominal wall abscess: I&D performed 12/16, revision 12/27.   Improved.  Enterobacter cloacae  and Streptococcus noted out of the cultures.  Treated with Bactrim  and amoxicillin .  Completed therapies. Local wound care.  Dressing instructions attached.   Enterobacter bacteremia: 1 out of 2 blood cultures drawn on 12/15 positive.  Treated with different antibiotics.  Completed therapies.   Aspiration pneumonia: Aspiration precautions.  Completed antibiotics.  Continues on nystatin  swish and swallow.  All-time aspiration precautions.   Essential hypertension blood pressure is stable on Norvasc  now.    Chronic A-fib: Rate controlled.  Eliquis  discontinued due to subarachnoid hemorrhage.   Transaminitis: Likely due to infection.  LFTs normalized.  Will resume statin on discharge.  Stable for discharge today to skilled level of care.   Pressure Injury 01/21/23 Sacrum Stage 2 -  Partial thickness loss of dermis presenting as a shallow open injury with a red, pink wound bed without slough. red open sore (Active)  01/21/23 0800  Location: Sacrum  Location Orientation:   Staging: Stage 2 -  Partial thickness loss of dermis presenting as a shallow open injury with a red, pink wound bed without slough.  Wound Description (Comments): red open sore  Present on Admission: No     Pressure Injury 01/21/23 Heel Left;Posterior Deep Tissue Pressure Injury - Purple or maroon localized area of discolored intact skin or blood-filled blister due to damage of underlying soft tissue from pressure and/or shear. purplish area (Active)  01/21/23 2000  Location: Heel  Location Orientation: Left;Posterior  Staging: Deep Tissue Pressure Injury - Purple or maroon localized area of discolored intact skin or blood-filled blister due to damage of underlying soft tissue from pressure and/or shear.  Wound Description (Comments): purplish area  Present on Admission: -- (unknown, no documentation found)    Discharge Diagnoses:  Principal Problem:   ICH (intracerebral hemorrhage)  (  HCC) Active Problems:   Hyperlipidemia   Malnutrition of moderate degree   Abdominal wall abscess   Bacteremia due to Enterobacter species   Aspiration pneumonia (HCC)   Dysphagia   Oral candidiasis   Transaminitis   Chronic a-fib (HCC)   Pressure injury of skin    Discharge Instructions  Discharge Instructions     Ambulatory referral to Neurology   Complete by: As directed    An appointment is requested in approximately: 4 weeks   Diet - low sodium heart healthy   Complete by: As directed    Dysphagia 2 diet   Discharge wound care:   Complete by: As directed    Comments: Left Heel: Cover with foam dressing, change every 3 days.  Continue using Prevalon boots on both sides to prevent further injuries. Comments: Foam dressing to sacrum, change Q 3 days or PRN soiling Wound care  Daily      Comments: Dry dressing over abdominal incision   Increase activity slowly   Complete by: As directed    Increase activity slowly   Complete by: As directed       Allergies as of 02/07/2023   No Known Allergies      Medication List     STOP taking these medications    apixaban  5 MG Tabs tablet Commonly known as: ELIQUIS    hydrochlorothiazide  12.5 MG tablet Commonly known as: HYDRODIURIL    irbesartan  150 MG tablet Commonly known as: AVAPRO    spironolactone  25 MG tablet Commonly known as: ALDACTONE        TAKE these medications    acetaminophen  325 MG tablet Commonly known as: TYLENOL  Take 2 tablets (650 mg total) by mouth every 4 (four) hours as needed for mild pain (pain score 1-3) (or temp > 37.5 C (99.5 F)). What changed: how to take this   amLODipine  10 MG tablet Commonly known as: NORVASC  Place 1 tablet (10 mg total) into feeding tube daily.   ascorbic acid  250 MG tablet Commonly known as: VITAMIN C  Place 1 tablet (250 mg total) into feeding tube daily.   atorvastatin  40 MG tablet Commonly known as: LIPITOR Place 1 tablet (40 mg total) into feeding  tube daily.   calcium  citrate-vitamin D  500-400 MG-UNIT chewable tablet Place 1 tablet into feeding tube 2 (two) times daily.   famotidine  20 MG tablet Commonly known as: PEPCID  Place 1 tablet (20 mg total) into feeding tube daily.   feeding supplement (OSMOLITE 1.2 CAL) Liqd Place 1,000 mLs into feeding tube continuous.   ferrous sulfate  300 (60 Fe) MG/5ML syrup Place 325 mg into feeding tube daily.   fiber supplement (BANATROL TF) liquid Place 60 mLs into feeding tube 2 (two) times daily.   free water  Soln Place 100 mLs into feeding tube every 4 (four) hours.   HYDROcodone -acetaminophen  5-325 MG tablet Commonly known as: NORCO/VICODIN Take 1 tablet by mouth every 6 (six) hours as needed for moderate pain (pain score 4-6).   ipratropium-albuterol  0.5-2.5 (3) MG/3ML Soln Commonly known as: DUONEB Take 3 mLs by nebulization every 6 (six) hours as needed.   lidocaine  5 % Commonly known as: LIDODERM  Place 2 patches onto the skin daily. Remove & Discard patch within 12 hours or as directed by MD   liver oil-zinc  oxide 40 % ointment Commonly known as: DESITIN Apply topically 3 (three) times daily. Apply to perianal region, buttock, groin after each stool   mouth rinse Liqd solution 15 mLs by Mouth Rinse route 4 (four) times daily  for 14 days. Suction Debris from the mouth. Brush teeth with a suction toothbrush with antiseptic oral rinse. Apply moisturizer in the mouth and on the lips. This is NOT a pharmacy stocked item. Obtain from [facility].   mouth rinse Liqd solution 15 mLs by Mouth Rinse route as needed (oral care).   multivitamin Liqd Place 15 mLs into feeding tube daily.   nystatin  100000 UNIT/ML suspension Commonly known as: MYCOSTATIN  Take 5 mLs (500,000 Units total) by mouth 4 (four) times daily for 7 days.   white petrolatum  Oint Commonly known as: VASELINE Apply 1 Application topically as needed for lip care.               Discharge Care  Instructions  (From admission, onward)           Start     Ordered   02/07/23 0000  Discharge wound care:       Comments: Comments: Left Heel: Cover with foam dressing, change every 3 days.  Continue using Prevalon boots on both sides to prevent further injuries. Comments: Foam dressing to sacrum, change Q 3 days or PRN soiling Wound care  Daily      Comments: Dry dressing over abdominal incision   02/07/23 1458            Follow-up Information     Maczis, Puja Gosai, PA-C. Call.   Specialty: General Surgery Why: We are making a follow up appointment for you., Please call to confirm appointment time., Arrive 30 minutes early to complete check in, and bring photo ID and insurance card. Contact information: [address] [facility] [name] [address] [phone]         MD AT SNF Follow up.           [facility] Follow up in 4 week(s).   Specialty: Neurology Contact information: [address] [place]  [address] [phone]        slp Follow up.                 No Known Allergies  Consultations: Neurology   Procedures/Studies: DG CHEST PORT 1 VIEW Result Date: 01/25/2023 CLINICAL DATA:  Aspiration into airway EXAM: PORTABLE CHEST 1 VIEW COMPARISON:  01/24/2023 FINDINGS: Shallow inspiration. Cardiac enlargement. Calcification in the mitral valve annulus and aorta. Patchy infiltrates in the left mid and lower lung zone, similar to prior study. This is likely pneumonia, possibly aspiration in the appropriate clinical setting. Right lung is clear. No pleural effusions. No pneumothorax. Calcification of the aorta. Degenerative changes in the spine and shoulders. IMPRESSION: Persistent infiltration in the left mid and lower lung, similar to prior study. Cardiac enlargement. Electronically Signed   By: Elsie Gravely M.D.   On: 01/25/2023 19:55   DG CHEST PORT 1  VIEW Result Date: 01/24/2023 CLINICAL DATA:  CHF EXAM: PORTABLE CHEST 1 VIEW COMPARISON:  01/14/2023 FINDINGS: Heart and mediastinal contours within normal limits. Aortic atherosclerosis. Patchy airspace disease throughout the left lower lung. No confluent opacity on the right. No effusions or acute bony abnormality. IMPRESSION: Patchy left lower lung airspace disease concerning for pneumonia. Electronically Signed   By: Franky Crease M.D.   On: 01/24/2023 21:23   DG ABDOMEN PEG TUBE LOCATION Result Date: 01/18/2023 CLINICAL DATA:  Leaking PEG tube. Gastrostomy tube replacement 2 days ago. EXAM: ABDOMEN - 1 VIEW COMPARISON:  Radiographs 01/15/2023 and 12/30/2022.  CT 01/16/2023. FINDINGS: 1159 hours. Single-view of  the mid to lower abdomen was obtained after contrast (30 cc Gastrografin ) was injected via the PEG tube. The contrast is within the lumen of the stomach. No extravasated enteric contrast identified. There is contrast material within the rectosigmoid colon from previous administration. There is a nonobstructive bowel gas pattern. Soft tissue emphysema within the anterior abdominal wall appears improved from recent CT. Foreign body overlying the left lateral abdominal wall is potentially a surgical drain or sponge. Correlate clinically. IMPRESSION: 1. No evidence of extravasated enteric contrast. Tip of the G-tube is within the gastric lumen. 2. Nonobstructive bowel gas pattern. 3. Improved soft tissue emphysema within the anterior abdominal wall. Possible surgical drain or sponge overlying the left lateral abdominal wall. Correlate clinically. Electronically Signed   By: Elsie Perone M.D.   On: 01/18/2023 13:48   IR REPLACE GASTRO/JEJUNO TUBE PERC W/FLUORO Result Date: 01/16/2023 INDICATION: 89 year old female with surgically placed G-tube which was inadvertently displaced. Currently, a 10 French Foley catheter is maintaining the tract. She presents for gastric tube replacement. EXAM:  Fluoroscopically guided placement of percutaneous pull-through gastrostomy tube Interventional Radiologist:  Wilkie LOIS Lent, MD MEDICATIONS: None. ANESTHESIA/SEDATION: None. CONTRAST:  15mL OMNIPAQUE  IOHEXOL  300 MG/ML  SOLN FLUOROSCOPY: Radiation exposure index: 5 mGy reference air kerma COMPLICATIONS: None immediate. PROCEDURE: Informed written consent was obtained from the patient after a thorough discussion of the procedural risks, benefits and alternatives. All questions were addressed. Maximal Sterile Barrier Technique was utilized including caps, mask, sterile gowns, sterile gloves, sterile drape, hand hygiene and skin antiseptic. A timeout was performed prior to the initiation of the procedure. Maximal barrier sterile technique utilized including caps, mask, sterile gowns, sterile gloves, large sterile drape, hand hygiene, and chlorhexadine skin prep. A gentle hand injection of contrast material through the Foley catheter was performed confirming the catheter is indeed within the gastric lumen. The catheter was removed over a wire. An Amplatz wire was advanced into the gastric lumen. The percutaneous tract was dilated to 20 French. A 20 French balloon retention gastrostomy tube was advanced over the wire and into the stomach. The retention balloon was filled with 15 mL saline and pulled snug against the anterior abdominal wall. The external bumper was fixed in place. Contrast injection was again performed confirming the tube is within the stomach. The tube was then flushed with saline. IMPRESSION: Successful exchange for a 20 French balloon retention gastrostomy tube. Electronically Signed   By: Wilkie Lent M.D.   On: 01/16/2023 18:31   CT ABDOMEN WO CONTRAST Result Date: 01/16/2023 CLINICAL DATA:  Peg tube placement. EXAM: CT ABDOMEN WITHOUT CONTRAST TECHNIQUE: Multidetector CT imaging of the abdomen was performed following the standard protocol without IV contrast. RADIATION DOSE REDUCTION:  This exam was performed according to the departmental dose-optimization program which includes automated exposure control, adjustment of the mA and/or kV according to patient size and/or use of iterative reconstruction technique. COMPARISON:  CT abdomen 01/16/2023 FINDINGS: Lower chest: There is atelectasis in the left lung base. The heart is enlarged. Hepatobiliary: Gallbladder is surgically absent. There is no biliary ductal dilatation. There is a 3.1 cm cyst in the inferior right liver which is unchanged. Pancreas: Unremarkable. No pancreatic ductal dilatation or surrounding inflammatory changes. Spleen: Normal in size without focal abnormality. Adrenals/Urinary Tract: There is residual contrast in the bilateral renal collecting systems. No hydronephrosis or perinephric fluid. Adrenal glands are within normal limits. Stomach/Bowel: Again seen is a percutaneous gastrostomy tube in place in the body of the stomach. Contrast is seen  within nondilated stomach. There is a tract of contrast extravasation within the subcutaneous tissues left of the catheter which appears unchanged from prior. There is a small hiatal hernia. No dilated bowel loops are seen. Vascular/Lymphatic: Aortic atherosclerosis. No enlarged abdominal lymph nodes. Other: Extensive anterior abdominal wall subcutaneous emphysema again noted. No focal fluid collections or abdominal wall hernia. Musculoskeletal: Degenerative changes affect the spine. IMPRESSION: 1. Percutaneous gastrostomy tube in place in the body of the stomach. There is a tract of contrast extravasation within the subcutaneous tissues left of the catheter which appears unchanged from prior. 2. Extensive anterior abdominal wall subcutaneous emphysema again noted. Aortic Atherosclerosis (ICD10-I70.0). Electronically Signed   By: Greig Pique M.D.   On: 01/16/2023 00:41   CT ABDOMEN WO CONTRAST Result Date: 01/16/2023 CLINICAL DATA:  Status post percutaneous gastrostomy placement.  Abdominal pain. EXAM: CT ABDOMEN WITHOUT CONTRAST TECHNIQUE: Multidetector CT imaging of the abdomen was performed following the standard protocol without IV contrast. RADIATION DOSE REDUCTION: This exam was performed according to the departmental dose-optimization program which includes automated exposure control, adjustment of the mA and/or kV according to patient size and/or use of iterative reconstruction technique. COMPARISON:  01/15/2023 FINDINGS: Lower chest: Cardiomegaly. Extensive calcification of the mitral valve annulus. Right coronary artery calcification. Moderate hiatal hernia. Eventration of the left posterior hemidiaphragm. Mild left basilar atelectasis. Hepatobiliary: Simple cyst within the inferior right hepatic lobe. Liver otherwise unremarkable this noncontrast examination. Status post cholecystectomy. Mild extrahepatic biliary ductal dilation is stable likely representing post cholecystectomy change Pancreas: Unremarkable Spleen: Unremarkable Adrenals/Urinary Tract: The adrenal glands are unremarkable. The kidneys are normal in size and position. Excreted contrast within the renal collecting system bilaterally. No hydronephrosis. Stomach/Bowel: The previously noted button type pertain is gastrostomy has been removed and replaced with a small bore catheter which extends only into the subcutaneous fat of the anterior abdominal wall along the gastrostomy tract. Injected contrast opacifies a tortuous and irregular tract which extends to the gastric lumen as well as extravasates laterally to the left within subcutaneous fat of the anterior abdominal wall in keeping with a partially disrupted or immature, but persistent tract. There is no intraperitoneal extension of contrast. Extensive subcutaneous gas is seen with anterior abdominal wall, similar prior examination. The visualized stomach, small bowel, and large bowel are otherwise unremarkable save for moderate distal colonic diverticulosis,  partially visualized. No free intraperitoneal gas. Vascular/Lymphatic: Extensive aortoiliac atherosclerotic calcification. Reviewed prominent atherosclerotic calcification within the proximal superior mesenteric artery and celiac axis again noted, likely resulting in hemodynamically significant stenoses. No pathologic adenopathy. Other: None significant Musculoskeletal: Advanced degenerative changes are seen within the visualized thoracolumbar spine with superimposed levoscoliosis. No acute bone abnormality. IMPRESSION: 1. Interval removal of previously noted button type gastrostomy and placement of a small bore catheter which extends only into the subcutaneous fat of the anterior abdominal wall along the gastrostomy tract. Injected contrast opacifies a tortuous and irregular tract which extends to the gastric lumen as well as extravasates laterally to the left within subcutaneous fat of the anterior abdominal wall in keeping with a partially disrupted or immature, but persistent tract. No intraperitoneal extension of contrast. No free intraperitoneal gas. 2. Stable extensive subcutaneous gas within the anterior abdominal wall of 3. Cardiomegaly. Right coronary artery calcification. 4. Extensive atherosclerotic calcification within the proximal superior mesenteric artery and celiac axis again noted, likely resulting in hemodynamically significant stenoses. If there is clinical evidence of chronic mesenteric ischemia, this would be better assessed with CT arteriography. 5. Moderate distal  colonic diverticulosis, partially visualized. Aortic Atherosclerosis (ICD10-I70.0). Electronically Signed   By: Dorethia Molt M.D.   On: 01/16/2023 00:30   DG ABDOMEN PEG TUBE LOCATION Result Date: 01/16/2023 CLINICAL DATA:  Peg tube adjustment EXAM: ABDOMEN - 1 VIEW COMPARISON:  CT 01/15/2023 FINDINGS: Contrast opacifies the stomach. There is enteral contrast within the bowel as was noted on previous CT. Large amount of  subcutaneous emphysema limits the exam. No frank extravasation is seen. IMPRESSION: Large amount of soft tissue emphysema as well as presence of pre-existing contrast limits the exam. The gastrostomy balloon is poorly visible but injected contrast opacifies the stomach. Difficult to exclude contrast within gastrostomy tract given appearance on prior CT versus small volume extravasated contrast on the subsequent image obtained after flushing. Repeat limited abdominal CT could be obtained to reassess tube position given the described limitations. Electronically Signed   By: Luke Bun M.D.   On: 01/16/2023 00:07   CT ABDOMEN PELVIS W CONTRAST Addendum Date: 01/15/2023 ADDENDUM REPORT: 01/15/2023 23:44 ADDENDUM: Additional finding. Slightly dense mass or enlargement of the left iliacus muscle compared to the right measuring 6.9 x 3.5 cm. This could reflect an intramuscular hematoma. Correlate for any history of trauma, and attention on follow-up imaging. Electronically Signed   By: Luke Bun M.D.   On: 01/15/2023 23:44   Result Date: 01/15/2023 CLINICAL DATA:  Nonlocalized abdomen pain EXAM: CT ABDOMEN AND PELVIS WITH CONTRAST TECHNIQUE: Multidetector CT imaging of the abdomen and pelvis was performed using the standard protocol following bolus administration of intravenous contrast. RADIATION DOSE REDUCTION: This exam was performed according to the departmental dose-optimization program which includes automated exposure control, adjustment of the mA and/or kV according to patient size and/or use of iterative reconstruction technique. CONTRAST:  75mL OMNIPAQUE  IOHEXOL  350 MG/ML SOLN COMPARISON:  None Available. FINDINGS: Lower chest: Lung bases demonstrate mild consolidation in the left base. Cardiomegaly with coronary vascular calcification. Mitral calcification. Small hiatal hernia Hepatobiliary: Cholecystectomy. No focal hepatic abnormality. 12 mm common bile duct likely due to postsurgical change.  Pancreas: Unremarkable. No pancreatic ductal dilatation or surrounding inflammatory changes. Spleen: Normal in size without focal abnormality. Adrenals/Urinary Tract: Adrenal glands are within normal limits. Kidneys show no hydronephrosis. The bladder is unremarkable. Stomach/Bowel: Gastrostomy tube balloon is position within the subcutaneous soft tissues of the anterior abdominal wall with gas in the gastrostomy tube tract leading to the stomach. Large volume subcutaneous emphysema within the anterior abdominal wall. Enteral contrast otherwise within small and large bowel. No bowel obstruction or bowel wall thickening. Diverticular disease of the sigmoid colon. Vascular/Lymphatic: Advanced aortic atherosclerosis. No aneurysm. No suspicious lymph nodes. Reproductive: Hysterectomy. No adnexal mass. Some contrast within the vaginal cuff Other: Negative for pelvic effusion or free air. Musculoskeletal: Scoliosis and degenerative changes of the spine. No acute osseous abnormality. IMPRESSION: 1. Malposition percutaneous gastrostomy tube with balloon visualized within the subcutaneous soft tissues of the anterior abdominal wall and contiguous with gas-filled gastrostomy tube tract extending to the stomach. Large volume subcutaneous emphysema within the anterior abdominal wall. 2. No evidence for a bowel obstruction, enteral contrast reaches the rectum. There is some contrast present within the vaginal cuff, but no directly visualized enterovaginal fistula is identified. 3. Sigmoid colon diverticular disease without acute inflammatory process. These results will be called to the ordering clinician or representative by the Radiologist Assistant, and communication documented in the PACS or [employer]. Electronically Signed: By: Luke Bun M.D. On: 01/15/2023 23:11   CT HEAD WO CONTRAST ( )  Result Date: 01/15/2023 CLINICAL DATA:  Follow-up examination for stroke. EXAM: CT HEAD WITHOUT CONTRAST TECHNIQUE:  Contiguous axial images were obtained from the base of the skull through the vertex without intravenous contrast. RADIATION DOSE REDUCTION: This exam was performed according to the departmental dose-optimization program which includes automated exposure control, adjustment of the mA and/or kV according to patient size and/or use of iterative reconstruction technique. COMPARISON:  Prior studies from 01/13/2023 FINDINGS: Brain: Age-related cerebral atrophy with chronic small vessel ischemic disease. Evolving subacute right MCA distribution infarct again noted, grossly similar to prior. Presumed hemorrhagic transformation with small volume subarachnoid hemorrhage within the right sylvian fissure, decreased from prior. No new intracranial hemorrhage. No other acute large vessel territory infarct. No mass lesion or midline shift. No hydrocephalus or extra-axial fluid collection. Vascular: No abnormal hyperdense vessel. Calcified atherosclerosis present at the skull base. Skull: Scalp soft tissues and calvarium demonstrate no new finding. Sinuses/Orbits: Globes and orbital soft tissues within normal limits. Paranasal sinuses and mastoid air cells are largely clear. Other: None. IMPRESSION: 1. Evolving subacute right MCA distribution infarct, grossly similar to prior. Presumed hemorrhagic transformation with small volume subarachnoid hemorrhage within the right Sylvian fissure, decreased from prior. 2. No other new acute intracranial abnormality. Electronically Signed   By: Morene Hoard M.D.   On: 01/15/2023 23:10   Overnight EEG with video Result Date: 01/15/2023 Shelton Arlin KIDD, MD     01/15/2023  3:46 PM Patient Name: LYNNETT LANGLINAIS MRN: [id number] Epilepsy Attending: Arlin KIDD Shelton Referring Physician/Provider: Nichola Zeke HERO, MD Duration: 01/14/2023 1104 to 01/15/2023 1104  Patient history:  89 y.o. female who re-presents with right parietotemporal predominantly subarachnoid hemorrhage with a small  parenchymal component at the location of one of her recent strokes, while on Eliquis . Discharged on 12/11 after admission for right hemisphere strokes. EEG to evaluate for seizure  Level of alertness: Awake, asleep  AEDs during EEG study: None  Technical aspects: This EEG study was done with scalp electrodes positioned according to the 10-20 International system of electrode placement. Electrical activity was reviewed with band pass filter of 1-70Hz , sensitivity of 7 uV/mm, display speed of 75mm/sec with a 60Hz  notched filter applied as appropriate. EEG data were recorded continuously and digitally stored.  Video monitoring was available and reviewed as appropriate.  Description: The posterior dominant rhythm consists of 7.5 Hz activity of moderate voltage (25-35 uV) seen predominantly in posterior head regions, symmetric and reactive to eye opening and eye closing. Sleep was characterized by vertex waves, sleep spindles (12 to 14 Hz), maximal frontocentral region. EEG showed continuous generalized and lateralized right hemisphere 3 to 6 Hz theta-delta slowing. Generalized and maximal posterior quadrant sharp transients with triphasic morphology were noted intermittently. Hyperventilation and photic stimulation were not performed.    ABNORMALITY - Continuous slow, generalized and lateralized right hemisphere  IMPRESSION: This study is suggestive of cortical dysfunction arising from right hemisphere likely secondary to underlying structural abnormality. Additionally there is moderate diffuse encephalopathy. No seizures were seen throughout the recording.  Generalized sharp transients noted during routine eeg do not appear to be epileptic in nature. Arlin KIDD Shelton  DG CHEST PORT 1 VIEW Result Date: 01/14/2023 CLINICAL DATA:  [id number] Respiratory abnormalities [id number] EXAM: PORTABLE CHEST 1 VIEW COMPARISON:  01/09/2023 chest radiograph. FINDINGS: The right rotated chest radiograph. Stable cardiomediastinal  silhouette with mild cardiomegaly. No pneumothorax. No pleural effusion. No pulmonary edema. No consolidative airspace disease. Previously described faint upper right lung opacities are not discretely visualized  on today's radiograph. IMPRESSION: 1. Mild cardiomegaly. No pulmonary edema. 2. Previously described faint upper right lung opacities are not discretely visualized on today's radiograph. Electronically Signed   By: Selinda DELENA Blue M.D.   On: 01/14/2023 16:36   EEG adult Result Date: 01/14/2023 Shelton Arlin KIDD, MD     01/14/2023 11:00 AM Patient Name: BYRON TIPPING MRN: [id number] Epilepsy Attending: Arlin KIDD Shelton Referring Physician/Provider: Remi Pippin, NP Date: 01/14/2023 Duration: 23.35 mins Patient history:  89 y.o. female who re-presents with right parietotemporal predominantly subarachnoid hemorrhage with a small parenchymal component at the location of one of her recent strokes, while on Eliquis . Discharged on 12/11 after admission for right hemisphere strokes. EEG to evaluate for seizure Level of alertness: Awake, asleep AEDs during EEG study: None Technical aspects: This EEG study was done with scalp electrodes positioned according to the 10-20 International system of electrode placement. Electrical activity was reviewed with band pass filter of 1-70Hz , sensitivity of 7 uV/mm, display speed of 32mm/sec with a 60Hz  notched filter applied as appropriate. EEG data were recorded continuously and digitally stored.  Video monitoring was available and reviewed as appropriate. Description: The posterior dominant rhythm consists of 7.5 Hz activity of moderate voltage (25-35 uV) seen predominantly in posterior head regions, symmetric and reactive to eye opening and eye closing. Sleep was characterized by vertex waves, sleep spindles (12 to 14 Hz), maximal frontocentral region. EEG showed continuous generalized and lateralized right hemisphere 3 to 6 Hz theta-delta slowing. Generalized sharp  transients were noted. Hyperventilation and photic stimulation were not performed.   ABNORMALITY - Continuous slow, generalized and lateralized right hemisphere IMPRESSION: This study is suggestive of cortical dysfunction arising from right hemisphere likely secondary to underlying structural abnormality. Additionally there is moderate diffuse encephalopathy. No seizures were seen throughout the recording. Generalized sharp transients were noted. Recommend long term monitoring for further evaluation Priyanka KIDD Shelton   MR BRAIN WO CONTRAST Result Date: 01/13/2023 CLINICAL DATA:  Follow-up examination for recent stroke, now with evidence for hemorrhagic transformation. EXAM: MRI HEAD WITHOUT CONTRAST TECHNIQUE: Multiplanar, multiecho pulse sequences of the brain and surrounding structures were obtained without intravenous contrast. COMPARISON:  Prior CTs from earlier the same day as well as recent MRI from 12/30/2022. FINDINGS: Brain: Cerebral volume within normal limits for age. Patchy T2/FLAIR hyperintensity involving the supratentorial cerebral white matter, consistent with chronic small vessel ischemic disease, moderately advanced in nature. Small remote left cerebellar infarct. There has been normal expected interval evolution of previously identified right MCA distribution infarct. Overall, size and distribution of the infarct is relatively stable, although there has been some interval blooming of a few foci since previous. No new areas of infarction. Changes of wallerian degeneration noted extending along the descending right white matter tract. Associated hemorrhagic transformation with small volume subarachnoid hemorrhage within the right sylvian fissure, stable from prior CT. No other acute or subacute ischemic changes elsewhere within the brain. No other acute or chronic intracranial blood products. No mass lesion or midline shift. No hydrocephalus or extra-axial fluid collection. Pituitary gland  suprasellar region within normal limits. Vascular: Major intracranial vascular flow voids are maintained. Skull and upper cervical spine: Craniocervical junction within normal limits. Bone marrow signal intensity normal. No scalp soft tissue abnormality. Sinuses/Orbits: Prior bilateral ocular lens replacement. Paranasal sinuses are largely clear. Trace left mastoid effusion, of doubtful significance. Other: None. IMPRESSION: 1. Normal expected interval evolution of previously identified right MCA distribution infarct. Associated hemorrhagic transformation with small volume subarachnoid hemorrhage  within the right Sylvian fissure, stable from prior CT. 2. No other new acute intracranial abnormality. 3. Underlying moderately advanced chronic microvascular ischemic disease. Electronically Signed   By: Morene Hoard M.D.   On: 01/13/2023 23:35   CT ANGIO HEAD NECK W WO CM (CODE STROKE) Result Date: 01/13/2023 CLINICAL DATA:  Initial evaluation for acute neuro deficit, stroke, subarachnoid hemorrhage. EXAM: CT ANGIOGRAPHY HEAD AND NECK WITH AND WITHOUT CONTRAST TECHNIQUE: Multidetector CT imaging of the head and neck was performed using the standard protocol during bolus administration of intravenous contrast. Multiplanar CT image reconstructions and MIPs were obtained to evaluate the vascular anatomy. Carotid stenosis measurements (when applicable) are obtained utilizing NASCET criteria, using the distal internal carotid diameter as the denominator. RADIATION DOSE REDUCTION: This exam was performed according to the departmental dose-optimization program which includes automated exposure control, adjustment of the mA and/or kV according to patient size and/or use of iterative reconstruction technique. CONTRAST:  75mL OMNIPAQUE  IOHEXOL  350 MG/ML SOLN COMPARISON:  Prior CT from earlier the same day as well as MRA from 12/30/2022 FINDINGS: CTA NECK FINDINGS Aortic arch: Aortic arch within normal limits for  caliber with standard branch pattern. Aortic atherosclerosis. No significant stenosis about the origin the great vessels. Right carotid system: Right common and internal carotid arteries are patent without dissection. Moderate calcified plaque about the right carotid bulb without hemodynamically significant greater than 50% stenosis. Left carotid system: Left common and internal carotid arteries are patent without dissection. Mild-to-moderate atheromatous plaque about the left carotid bulb without hemodynamically significant greater than 50% stenosis. Vertebral arteries: Right vertebral artery dominant. Atheromatous plaque at the origin of the right vertebral artery with moderate to severe stenosis. Vertebral arteries are otherwise patent without stenosis or dissection. Skeleton: No worrisome osseous lesions. Advanced spondylosis at C4-5 through C6-7. patient is edentulous. Osteoarthritic changes noted about the TMJs. Other neck: No other acute finding. Upper chest: No other acute finding. Review of the MIP images confirms the above findings CTA HEAD FINDINGS Anterior circulation: Moderate atheromatous change about the carotid siphons without hemodynamically significant stenosis. Note again made of a 3-4 mm outpouching extending posteriorly and inferiorly from the supraclinoid left ICA, which could reflect a small aneurysm versus vascular infundibulum (series 6, image 102). A similar 3-4 mm outpouching at the contralateral supraclinoid right ICA (series 6, image 103), which could also reflect a small infundibulum versus aneurysm. A1 segments patent bilaterally. Normal anterior communicating artery complex. Anterior cerebral arteries patent without significant stenosis. No M1 stenosis or occlusion. Normal MCA bifurcations. Distal MCA branches remain patent and perfused. Posterior circulation: Both V4 segments patent without stenosis. Neither PICA origin well visualized. Basilar patent without stenosis. Superior  cerebellar and posterior cerebral arteries patent bilaterally. Venous sinuses: Not well assessed due to timing of the contrast bolus. Anatomic variants: As above. Review of the MIP images confirms the above findings IMPRESSION: 1. Negative CTA for large vessel occlusion or other emergent finding. 2. 3-4 mm outpouchings extending posteriorly and inferiorly from both supraclinoid ICAs, which could reflect small aneurysms versus vascular infundibula. Appearance is similar as compared to recent MRA from 12/30/2022. 3. Moderate atheromatous plaque about the carotid bifurcations and carotid siphons without hemodynamically significant stenosis. 4. Atheromatous plaque at the origin of the right vertebral artery with moderate to severe stenosis. 5.  Aortic Atherosclerosis (ICD10-I70.0). Electronically Signed   By: Morene Hoard M.D.   On: 01/13/2023 21:06   CT HEAD CODE STROKE WO CONTRAST Result Date: 01/13/2023 CLINICAL DATA:  Code stroke.  Initial evaluation for neuro deficit, facial droop. EXAM: CT HEAD WITHOUT CONTRAST TECHNIQUE: Contiguous axial images were obtained from the base of the skull through the vertex without intravenous contrast. RADIATION DOSE REDUCTION: This exam was performed according to the departmental dose-optimization program which includes automated exposure control, adjustment of the mA and/or kV according to patient size and/or use of iterative reconstruction technique. COMPARISON:  Prior MRI from 12/30/2022 FINDINGS: Brain: Abnormal hypodensity related to recently identified right MCA distribution infarcts again seen, similar to prior. There has been interval development of scattered small volume subarachnoid hemorrhage within the right sylvian fissure. No significant mass effect. No other acute intracranial hemorrhage. No other acute large vessel territory infarct. No mass lesion or midline shift. No hydrocephalus or extra-axial fluid collection. Vascular: No abnormal hyperdense vessel.  Skull: Scalp soft tissues within normal limits.  Calvarium intact. Sinuses/Orbits: Globes and orbital soft tissues within normal limits. Small volume pneumatized secretions noted within the left sphenoid sinus. Paranasal sinuses are otherwise clear. No mastoid effusion. Other: None. ASPECTS [facility] Stroke Program Early CT Score) - Ganglionic level infarction (caudate, lentiform nuclei, internal capsule, insula, M1-M3 cortex): 4 - Supraganglionic infarction (M4-M6 cortex): 3 Total score (0-10 with 10 being normal): 7 IMPRESSION: 1. Interval development of scattered small volume subarachnoid hemorrhage within the right Sylvian fissure. No significant mass effect. Upon review of recent MRA there is question of a 3-4 mm outpouching at the takeoff of the right PCOM, which could potentially reflect a small aneurysm versus infundibulum, not appreciated on initial interpretation. This finding is not certain, as no aneurysm is described at this location on recent arteriogram. 2. Evolving right MCA distribution infarcts, similar to prior. 3. Aspects is 7. Critical Value/emergent results were called by telephone at the time of interpretation on 01/13/2023 at 7:59 pm to provider DAVID WELLS , who verbally acknowledged these results. Electronically Signed   By: Morene Hoard M.D.   On: 01/13/2023 20:11   DG Swallowing Func-Speech Pathology Result Date: 01/12/2023 Table formatting from the original result was not included. Modified Barium Swallow Study Patient Details Name: BRITANIE HARSHMAN MRN: [id number] Date of Birth: [birth year] Today's Date: 01/11/2023 HPI/PMH: HPI: 89 yo female presenting on 11/27 with L sided weakness and R MCA occlusion s/p IR revascularization with complicated arterial cannulation requiring multiple sticks. Intubated 11/28-11/29. PMH includes: GERD, HTN, CHF, AFIB RVR on eliquis , HLD, OSA. MBSS 01/02/23 was limited due to oral holding. Clinical Impression: Clinical Impression: Pt presents with  s/sx moderate oral dysphagia c/b intermittent oral holding, repetitive/disorganized lingual motion, prolonged mastication, intermittent piecemeal swallow, and trace-mild oral stasis. Oral deficits likely due to dental status and exacerbated by cognitive deficits. Pharyngeal swallow c/b  trace-mild pharyngeal stasis which was mainly in vallecula, but also observed on posterior pharyngeal swallow and pyriform sinus. Residual secondary to decreased base of tongue retraction and reduced pharyngeal stripping wave in setting of suspected cervical osteophytes.  Age-related swallow changes appreciated including pre-swallow and during the swallow (on secondary swallows) penetration occured with x1 very large straw sip of then when used as a liquid wash for solids as well as swallow initiation at the pyriform sinus. Concern for possible pharyngoesophageal component to pt's dysphagia given prominent appearance to the cricopharyngeus. Given today's findings, recommend initiation of a puree diet with thin liquids with safe swallowing strategies/aspiration precautions as outlined below. Concern for pt's ability to meet oral needs nutritionally given severity of oral deficits. Pt OK for solids to be advanced clinically. SLP to continue to f/u  per POC. Factors that may increase risk of adverse event in presence of aspiration Noe & Lianne 2021): Factors that may increase risk of adverse event in presence of aspiration Noe & Lianne 2021): Reduced cognitive function; Limited mobility; Frail or deconditioned; Dependence for feeding and/or oral hygiene Recommendations/Plan: Swallowing Evaluation Recommendations Swallowing Evaluation Recommendations Recommendations: PO diet PO Diet Recommendation: Dysphagia 1 (Pureed); Thin liquids (Level 0) Liquid Administration via: Spoon; Cup; Straw Medication Administration: Crushed with puree Supervision: Staff to assist with self-feeding; Full assist for feeding; Full supervision/cueing  for swallowing strategies Swallowing strategies  : Minimize environmental distractions; Slow rate; Small bites/sips Postural changes: Position pt fully upright for meals; Stay upright 30-60 min after meals Oral care recommendations: Oral care QID (4x/day); Staff/trained caregiver to provide oral care Recommended consults: Consider dietitian consultation Caregiver Recommendations: Have oral suction available Treatment Plan Treatment Plan Treatment recommendations: Therapy as outlined in treatment plan below Follow-up recommendations: Skilled nursing-short term rehab (<3 hours/day) Functional status assessment: Patient has had a recent decline in their functional status and demonstrates the ability to make significant improvements in function in a reasonable and predictable amount of time. Treatment frequency: Min 2x/week Treatment duration: 2 weeks Interventions: Aspiration precaution training; Patient/family education; Compensatory techniques; Trials of upgraded texture/liquids Recommendations Recommendations for follow up therapy are one component of a multi-disciplinary discharge planning process, led by the attending physician.  Recommendations may be updated based on patient status, additional functional criteria and insurance authorization. Assessment: Orofacial Exam: Orofacial Exam Oral Cavity: Oral Hygiene: WFL Oral Cavity - Dentition: Edentulous Orofacial Anatomy: WFL Anatomy: Anatomy: Suspected cervical osteophytes; Prominent cricopharyngeus Boluses Administered: Boluses Administered Boluses Administered: Thin liquids (Level 0); Puree; Solid  Oral Impairment Domain: Oral Impairment Domain Lip Closure: Interlabial escape, no progression to anterior lip Tongue control during bolus hold: Not tested Bolus preparation/mastication: Slow prolonged chewing/mashing with complete recollection Bolus transport/lingual motion: Delayed initiation of tongue motion (oral holding); Repetitive/disorganized tongue motion Oral  residue: Residue collection on oral structures Location of oral residue : Tongue Initiation of pharyngeal swallow : Pyriform sinuses  Pharyngeal Impairment Domain: Pharyngeal Impairment Domain Soft palate elevation: No bolus between soft palate (SP)/pharyngeal wall (PW) Laryngeal elevation: Partial superior movement of thyroid  cartilage/partial approximation of arytenoids to epiglottic petiole Anterior hyoid excursion: Complete anterior movement Epiglottic movement: Complete inversion Laryngeal vestibule closure: Complete, no air/contrast in laryngeal vestibule Pharyngeal stripping wave : Present - diminished Pharyngeal contraction (A/P view only): N/A Pharyngoesophageal segment opening: Partial distention/partial duration, partial obstruction of flow Tongue base retraction: Narrow column of contrast or air between tongue base and PPW Pharyngeal residue: Collection of residue within or on pharyngeal structures Location of pharyngeal residue: Valleculae; Pharyngeal wall; Pyriform sinuses  Esophageal Impairment Domain: Esophageal Impairment Domain Esophageal clearance upright position: -- (deferred) Pill: Pill Consistency administered: -- (deferred) Penetration/Aspiration Scale Score: Penetration/Aspiration Scale Score 1.  Material does not enter airway: Thin liquids (Level 0); Puree; Solid 2.  Material enters airway, remains ABOVE vocal cords then ejected out: Thin liquids (Level 0) Compensatory Strategies: Compensatory Strategies Compensatory strategies: Yes Straw: Effective Effective Straw: Thin liquid (Level 0) Liquid wash: -- (minimally effective in clearing oral stasis with solid)   General Information: Caregiver present: No  Diet Prior to this Study: G-tube   Temperature : Febrile (low grade temp in last 24 hours)   Respiratory Status: WFL   Supplemental O2: None (Room air)   History of Recent Intubation: Yes  Behavior/Cognition: Alert; Cooperative; Pleasant mood; Requires cueing Self-Feeding Abilities:  Dependent for feeding Baseline  vocal quality/speech: Normal Volitional Cough: Unable to elicit Volitional Swallow: Unable to elicit Exam Limitations: No limitations Goal Planning: Prognosis for improved oropharyngeal function: Good Barriers to Reach Goals: Cognitive deficits No data recorded Patient/Family Stated Goal: did not state Consulted and agree with results and recommendations: Patient; Nurse Pain: Pain Assessment Pain Assessment: No/denies pain Faces Pain Scale: 0 Pain Location: L shoulder when moved Pain Descriptors / Indicators: Grimacing Pain Intervention(s): Monitored during session End of Session: Start Time:SLP Start Time (ACUTE ONLY): 9183 Stop Time: SLP Stop Time (ACUTE ONLY): 9166 Time Calculation:SLP Time Calculation (min) (ACUTE ONLY): 17 min Charges: SLP Evaluations $ SLP Speech Visit: 1 Visit SLP Evaluations $MBS Swallow: 1 Procedure $Swallowing Treatment: 1 Procedure $Speech Treatment for Individual: 1 Procedure SLP visit diagnosis: SLP Visit Diagnosis: Dysphagia, oropharyngeal phase (R13.12); Cognitive communication deficit (R41.841); Attention and concentration deficit; Dysarthria and anarthria (R47.1) Past Medical History: Past Medical History: Diagnosis Date  Arthritis of both knees 08/01/2015  Colitis   Essential hypertension, benign 07/28/2014  GERD (gastroesophageal reflux disease)   Hx of iron deficiency anemia 09/02/2015  Hyperlipidemia   Hypertension   Lumbar pain   Medicare annual wellness visit, subsequent 04/07/2017  OSA on CPAP 08/01/2015  Overweight (BMI 25.0-29.9) 07/10/2013  Prediabetes 09/02/2015  SOB (shortness of breath) 07/10/2013 Past Surgical History: Past Surgical History: Procedure Laterality Date  ABDOMINAL HYSTERECTOMY    CATARACT EXTRACTION    CHOLECYSTECTOMY    COLONOSCOPY WITH PROPOFOL  N/A 04/09/2018  Procedure: COLONOSCOPY WITH PROPOFOL ;  Surgeon: Unk Corinn Skiff, MD;  Location: [facility] ENDOSCOPY;  Service: Gastroenterology;  Laterality: N/A;  ESOPHAGOGASTRODUODENOSCOPY N/A  01/09/2023  Procedure: ESOPHAGOGASTRODUODENOSCOPY (EGD);  Surgeon: Paola Dreama SAILOR, MD;  Location: [facility] ENDOSCOPY;  Service: General;  Laterality: N/A;  IR CT HEAD LTD  12/29/2022  IR PERCUTANEOUS ART THROMBECTOMY/INFUSION INTRACRANIAL INC DIAG ANGIO  12/29/2022  IR US  GUIDE VASC ACCESS RIGHT  12/29/2022  IR US  GUIDE VASC ACCESS RIGHT  12/29/2022  LUMBAR DISC SURGERY    PEG PLACEMENT N/A 01/09/2023  Procedure: PERCUTANEOUS ENDOSCOPIC GASTROSTOMY (PEG) PLACEMENT;  Surgeon: Paola Dreama SAILOR, MD;  Location: [facility] ENDOSCOPY;  Service: General;  Laterality: N/A;  RADIOLOGY WITH ANESTHESIA N/A 12/29/2022  Procedure: IR WITH ANESTHESIA;  Surgeon: Radiologist, Medication, MD;  Location: [facility] OR;  Service: Radiology;  Laterality: N/A; Delon Bangs, M.S., CCC-SLP Speech-Language Pathologist Secure Chat Preferred O: [phone] Delon CHRISTELLA Bangs 01/11/2023, 9:15 AM   DG CHEST PORT 1 VIEW Result Date: 01/09/2023 CLINICAL DATA:  Respiratory abnormality. EXAM: PORTABLE CHEST 1 VIEW COMPARISON:  Chest radiograph dated 12/31/2022. FINDINGS: Significant improvement in the previously seen left lung opacities. Small left lung base density and possible trace pleural effusion. Faint areas of increased density in the right upper lobe also concerning for infiltrate. No pneumothorax. Mild cardiomegaly. No acute osseous pathology. IMPRESSION: 1. Significant improvement in the previously seen left lung opacities. 2. Small left lung base density and possible trace pleural effusion. 3. Faint areas of increased density in the right upper lobe also concerning for infiltrate. Electronically Signed   By: Vanetta Chou M.D.   On: 01/09/2023 19:34   (Echo, Carotid, EGD, Colonoscopy, ERCP)    Subjective: Patient seen in the morning rounds.  No complaints.   Discharge Exam: Vitals:   02/07/23 0846 02/07/23 1131  BP: 129/62 (!) 154/76  Pulse:  100  Resp:  18  Temp:  98.3 F (36.8 C)  SpO2:  99%   Vitals:   02/07/23 0332 02/07/23  0817 02/07/23 0846 02/07/23 1131  BP:  124/64 129/62 129/62 (!) 154/76  Pulse: 80 84  100  Resp: 16 18  18   Temp: 98.2 F (36.8 C) 98.7 F (37.1 C)  98.3 F (36.8 C)  TempSrc: Oral Oral  Oral  SpO2: 96% 98%  99%  Weight:      Height:        General exam: Appears calm and comfortable.  Pleasant to interaction.  Slow to react. Respiratory system: Clear to auscultation. Respiratory effort normal.  No added sounds. Cardiovascular system: S1 & S2 heard, RRR.  No pedal edema. Gastrointestinal system: Soft.  Nontender.  PEG tube insertion site clean and dry. Left lateral abdominal wound packed, looks clean and dry. Central nervous system: Alert and awake.  Mostly oriented.    The results of significant diagnostics from this hospitalization (including imaging, microbiology, ancillary and laboratory) are listed below for reference.     Microbiology: No results found for this or any previous visit (from the past 240 hours).   Labs: BNP (last 3 results) Recent Labs    12/07/22 1535  BNP 323.1*   Basic Metabolic Panel: Recent Labs  Lab 02/01/23 0654 02/02/23 0458 02/04/23 0611 02/05/23 0608 02/06/23 0621  NA 134* 135 135 135 134*  K 4.6 4.9 5.2* 5.1 5.2*  CL 100 102 103 101 101  CO2 25 25 23 25 26   GLUCOSE 128* 123* 123* 123* 119*  BUN 16 14 17 16 14   CREATININE 0.43* 0.53 0.64 0.60 0.61  CALCIUM  9.1 9.1 9.0 9.1 9.3   Liver Function Tests: Recent Labs  Lab 02/02/23 0458 02/04/23 0611 02/05/23 0608 02/06/23 0621  AST 35 38 30 44*  ALT 36 31 28 28   ALKPHOS 65 64 62 64  BILITOT 0.2 0.8 0.5 0.5  PROT 6.7 7.0 6.9 6.8  ALBUMIN  2.1* 2.3* 2.3* 2.3*   No results for input(s): LIPASE, AMYLASE in the last 168 hours. No results for input(s): AMMONIA in the last 168 hours. CBC: Recent Labs  Lab 02/01/23 0654 02/02/23 0458 02/04/23 0611 02/05/23 0608 02/06/23 0621  WBC 9.4 9.1 8.4 9.2 9.7  NEUTROABS 5.9  --   --   --   --   HGB 9.6* 9.5* 9.8* 9.5* 9.4*  HCT  29.8* 29.5* 30.7* 29.8* 29.6*  MCV 83.5 82.9 83.0 83.7 83.6  PLT 289 264 303 336 319   Cardiac Enzymes: No results for input(s): CKTOTAL, CKMB, CKMBINDEX, TROPONINI in the last 168 hours. BNP: Invalid input(s): POCBNP CBG: Recent Labs  Lab 02/06/23 0630 02/06/23 1343 02/06/23 1523 02/06/23 2211 02/07/23 0602  GLUCAP 111* 133* 132* 134* 130*   D-Dimer No results for input(s): DDIMER in the last 72 hours. Hgb A1c No results for input(s): HGBA1C in the last 72 hours. Lipid Profile No results for input(s): CHOL, HDL, LDLCALC, TRIG, CHOLHDL, LDLDIRECT in the last 72 hours. Thyroid  function studies No results for input(s): TSH, T4TOTAL, T3FREE, THYROIDAB in the last 72 hours.  Invalid input(s): FREET3 Anemia work up No results for input(s): VITAMINB12, FOLATE, FERRITIN, TIBC, IRON, RETICCTPCT in the last 72 hours. Urinalysis    Component Value Date/Time   COLORURINE YELLOW 01/14/2023 1213   APPEARANCEUR CLEAR 01/14/2023 1213   LABSPEC 1.044 (H) 01/14/2023 1213   PHURINE 5.0 01/14/2023 1213   GLUCOSEU NEGATIVE 01/14/2023 1213   HGBUR NEGATIVE 01/14/2023 1213   BILIRUBINUR NEGATIVE 01/14/2023 1213   KETONESUR NEGATIVE 01/14/2023 1213   PROTEINUR NEGATIVE 01/14/2023 1213   NITRITE NEGATIVE 01/14/2023 1213   LEUKOCYTESUR NEGATIVE 01/14/2023 1213  Sepsis Labs Recent Labs  Lab 02/02/23 0458 02/04/23 9388 02/05/23 0608 02/06/23 0621  WBC 9.1 8.4 9.2 9.7   Microbiology No results found for this or any previous visit (from the past 240 hours).   Time coordinating discharge: 32 minutes  SIGNED:   Renato Applebaum, MD  [facility] 02/07/2023, 3:01 PM

## 2023-02-07 NOTE — Progress Notes (Signed)
 PROGRESS NOTE    Jocelyn [name]  FMW:[id number] DOB: 05-14-1931 DOA: 01/14/2023 PCP: [name], PA-C    Brief Narrative:  89 year old with hypertension, GERD, hyperlipidemia, prediabetes history of A-fib who was on Eliquis  recently admitted to the hospital with acute right MCA territory infarct in the setting of atrial fibrillation.  Patient had PEG tube.  She was discharged to a SNF.  Came back to the [facility] for acute stroke symptoms again.  CT scan showed interval development of scattered subarachnoid hemorrhage in the right sylvian fissure.  Kcentra  was administered and Eliquis  was reversed.  Neurosurgery was consulted.  Patient was transferred to [facility].  Since then remains in the hospital.  Waiting for placement today.  Subjective: Patient seen and examined.  No overnight events.  Patient herself denies any complaints.  No family at the bedside.   Assessment & Plan:   Subarachnoid hemorrhage, possible cerebral aneurysm precipitated by Eliquis  therapy: Imaging suggested possible small aneurysms.  Seen by stroke team.  Currently not on any anticoagulation.  Follow-up requested. Recent admission 11/28-12/12/2022 for treatment of right MCA territory infarct that was thought to be cardioembolic due to A-fib.  Treated with intervention. Continue aggressive rehab.  Blood pressure controlled.  No antithrombotic. Allow dysphagia to diet with aspiration precautions.  Continue tube feeding until she has well-established oral intake.  Refer to SNF for rehab.  Abdominal wall abscess: I&D performed 12/16, revision 12/27.  Improved.  Enterobacter cloacae a and Streptococcus noted out of the cultures.  Treated with Bactrim  and amoxicillin .  Completed therapies. Local wound care.  Enterobacter bacteremia: 1 out of 2 blood cultures drawn on 12/15 positive.  Treated with different antibiotics.  Completed therapies.  Aspiration pneumonia: Aspiration precautions.  Completed antibiotics.   Continues on nystatin  swish and swallow.  All-time aspiration precautions.  Essential hypertension blood pressure is stable on Norvasc  now.  Monitoring.  Chronic A-fib: Rate controlled.  Eliquis  discontinued due to subarachnoid hemorrhage.  Transaminitis: Likely due to infection.  LFTs normalized.  Will resume statin on discharge.  Pressure Injury 01/21/23 Sacrum Stage 2 -  Partial thickness loss of dermis presenting as a shallow open injury with a red, pink wound bed without slough. red open sore (Active)  01/21/23 0800  Location: Sacrum  Location Orientation:   Staging: Stage 2 -  Partial thickness loss of dermis presenting as a shallow open injury with a red, pink wound bed without slough.  Wound Description (Comments): red open sore  Present on Admission: No     Pressure Injury 01/21/23 Heel Left;Posterior Deep Tissue Pressure Injury - Purple or maroon localized area of discolored intact skin or blood-filled blister due to damage of underlying soft tissue from pressure and/or shear. purplish area (Active)  01/21/23 2000  Location: Heel  Location Orientation: Left;Posterior  Staging: Deep Tissue Pressure Injury - Purple or maroon localized area of discolored intact skin or blood-filled blister due to damage of underlying soft tissue from pressure and/or shear.  Wound Description (Comments): purplish area  Present on Admission: -- (unknown, no documentation found)        DVT prophylaxis: heparin  injection 5,000 Units Start: 01/17/23 1400 SCD's Start: 01/14/23 0129   Code Status: Full code Family Communication: Patient's daughter called and updated 1/6.  She was updated that patient is medically stable to discharge.  Communicating with social worker to find SNF. Disposition Plan: Status is: Inpatient Remains inpatient appropriate because: Medically stable.  Needs SNF     Consultants:  Neurology Neurosurgery General  surgery  Procedures:  I&D of the abdominal wall  abscess  Antimicrobials:  Completed multiple antibiotics     Objective: Vitals:   02/06/23 2353 02/07/23 0332 02/07/23 0817 02/07/23 0846  BP: 125/64 124/64 129/62 129/62  Pulse: 92 80 84   Resp: 16 16 18    Temp: 98.1 F (36.7 C) 98.2 F (36.8 C) 98.7 F (37.1 C)   TempSrc: Oral Oral Oral   SpO2: 100% 96% 98%   Weight:      Height:        Intake/Output Summary (Last 24 hours) at 02/07/2023 1118 Last data filed at 02/07/2023 0858 Gross per 24 hour  Intake 5625.58 ml  Output 1450 ml  Net 4175.58 ml   Filed Weights   01/19/23 1300 01/21/23 0500 01/26/23 1400  Weight: 67.9 kg 69 kg 67.8 kg    Examination:  General exam: Appears calm and comfortable.  Pleasant to interaction.  Slow to react. Respiratory system: Clear to auscultation. Respiratory effort normal.  No added sounds. Cardiovascular system: S1 & S2 heard, RRR.  No pedal edema. Gastrointestinal system: Soft.  Nontender.  PEG tube insertion site clean and dry. Left lateral abdominal wound packed, looks clean and dry. Central nervous system: Alert and awake.  Mostly oriented.  Data Reviewed: I have personally reviewed following labs and imaging studies  CBC: Recent Labs  Lab 02/01/23 0654 02/02/23 0458 02/04/23 0611 02/05/23 0608 02/06/23 0621  WBC 9.4 9.1 8.4 9.2 9.7  NEUTROABS 5.9  --   --   --   --   HGB 9.6* 9.5* 9.8* 9.5* 9.4*  HCT 29.8* 29.5* 30.7* 29.8* 29.6*  MCV 83.5 82.9 83.0 83.7 83.6  PLT 289 264 303 336 319   Basic Metabolic Panel: Recent Labs  Lab 02/01/23 0654 02/02/23 0458 02/04/23 0611 02/05/23 0608 02/06/23 0621  NA 134* 135 135 135 134*  K 4.6 4.9 5.2* 5.1 5.2*  CL 100 102 103 101 101  CO2 25 25 23 25 26   GLUCOSE 128* 123* 123* 123* 119*  BUN 16 14 17 16 14   CREATININE 0.43* 0.53 0.64 0.60 0.61  CALCIUM  9.1 9.1 9.0 9.1 9.3   GFR: Estimated Creatinine Clearance: 44.1 mL/min (by C-G formula based on SCr of 0.61 mg/dL). Liver Function Tests: Recent Labs  Lab  02/02/23 0458 02/04/23 0611 02/05/23 0608 02/06/23 0621  AST 35 38 30 44*  ALT 36 31 28 28   ALKPHOS 65 64 62 64  BILITOT 0.2 0.8 0.5 0.5  PROT 6.7 7.0 6.9 6.8  ALBUMIN  2.1* 2.3* 2.3* 2.3*   No results for input(s): LIPASE, AMYLASE in the last 168 hours. No results for input(s): AMMONIA in the last 168 hours. Coagulation Profile: No results for input(s): INR, PROTIME in the last 168 hours. Cardiac Enzymes: No results for input(s): CKTOTAL, CKMB, CKMBINDEX, TROPONINI in the last 168 hours. BNP (last 3 results) No results for input(s): PROBNP in the last 8760 hours. HbA1C: No results for input(s): HGBA1C in the last 72 hours. CBG: Recent Labs  Lab 02/06/23 0630 02/06/23 1343 02/06/23 1523 02/06/23 2211 02/07/23 0602  GLUCAP 111* 133* 132* 134* 130*   Lipid Profile: No results for input(s): CHOL, HDL, LDLCALC, TRIG, CHOLHDL, LDLDIRECT in the last 72 hours. Thyroid  Function Tests: No results for input(s): TSH, T4TOTAL, FREET4, T3FREE, THYROIDAB in the last 72 hours. Anemia Panel: No results for input(s): VITAMINB12, FOLATE, FERRITIN, TIBC, IRON, RETICCTPCT in the last 72 hours. Sepsis Labs: No results for input(s): PROCALCITON, LATICACIDVEN in the last  168 hours.  No results found for this or any previous visit (from the past 240 hours).        Radiology Studies: No results found.      Scheduled Meds:  amLODipine   10 mg Per Tube Daily   ascorbic acid   250 mg Per Tube Daily   calcium -vitamin D   1 tablet Per Tube BID   famotidine   20 mg Per Tube Daily   ferrous sulfate   60 mg of iron Per Tube Daily   fiber supplement (BANATROL TF)  60 mL Per Tube BID   free water   100 mL Per Tube Q4H   heparin  injection (subcutaneous)  5,000 Units Subcutaneous Q8H   insulin  aspart  0-15 Units Subcutaneous Q8H   lidocaine   2 patch Transdermal Daily   liver oil-zinc  oxide   Topical TID   multivitamin  15 mL Per Tube  Daily   nystatin   5 mL Mouth/Throat QID   mouth rinse  15 mL Mouth Rinse 4 times per day   Continuous Infusions:  feeding supplement (OSMOLITE 1.2 CAL) 55 mL/hr at 02/07/23 0217     LOS: 24 days    Time spent: 35 minutes    Renato Applebaum, MD [facility]

## 2023-02-07 NOTE — Progress Notes (Signed)
 Occupational Therapy Treatment Patient Details Name: Jocelyn [name] MRN: [id number] DOB: October 06, 1931 Today's Date: 02/07/2023   History of present illness Pt is an 89 y.o. female who presented to the ED 12/13 due to LUE weakness, left facial droop and CTH showed development of scattered SAH. 12/15 dislodged PEG tube. 12/16 incision and drainage of left abdominal wall infection PEG replaced. 12/19 additional I&D of abdominal wall performed. Recent hospitalization 11/27-12/11 due to R MCA ischemic stroke for which she underwent IR thrombectomy and PEG placement.  She was discharged to [facility] 12/11. PMH: HTN, CHF, a fib with RVR on eliquis , HLD, OSA   OT comments  Pt making steady progress towards OT goals this session. Pt continues to present with impaired balance, generalized deconditioning and L sided weakness . Session focus on functional mobility as precursor to higher level ADLS. Pt currently requires MAX A +2 for bed mobility and MAX A +2 to stand to stedy standing frame. Pt presents with impaired motor planning needing step by step cues to sequence mobility tasks, pt overall seems to be fearful of mobility needing increased time to process. Pt would continue to benefit from skilled occupational therapy while admitted and after d/c to address the below listed limitations in order to improve overall functional mobility and facilitate independence with BADL participation. DC plan remains appropriate, will follow acutely per POC.         If plan is discharge home, recommend the following:  A lot of help with walking and/or transfers;Two people to help with walking and/or transfers;A lot of help with bathing/dressing/bathroom;Two people to help with bathing/dressing/bathroom;Assistance with cooking/housework;Assistance with feeding;Direct supervision/assist for medications management;Direct supervision/assist for financial management;Assist for transportation;Help with stairs or ramp for entrance    Equipment Recommendations  None recommended by OT    Recommendations for Other Services      Precautions / Restrictions Precautions Precautions: Fall Precaution Comments: PEG tube, abd binder Restrictions Weight Bearing Restrictions Per Provider Order: No       Mobility Bed Mobility Overal bed mobility: Needs Assistance Bed Mobility: Supine to Sit     Supine to sit: Max assist, +2 for physical assistance     General bed mobility comments: pt able to use BUEs on bed rail to assist with elevating trunk, assist needed to fully maneuver BLEs to EOB,    Transfers Overall transfer level: Needs assistance Equipment used: Ambulation equipment used Transfers: Sit to/from Stand, Bed to chair/wheelchair/BSC Sit to Stand: Max assist, +2 physical assistance, +2 safety/equipment, From elevated surface           General transfer comment: pt able to stand from EOB to stedy wtih MAX A +2, assist needed to rise from elevated EOB and shift hips anteriorly into standing. pt able to stand from seat of stedy with MODA+2. MAX mutlimodal cues needed to sequence motor planning using novel equipment. pt overall seems to be fearful of movement needing + time and encouragment, dependent transfer into recliner in stedy Transfer via Lift Equipment: Stedy   Balance Overall balance assessment: Needs assistance Sitting-balance support: Single extremity supported, Bilateral upper extremity supported Sitting balance-Leahy Scale: Poor Sitting balance - Comments: pt sitting EOB with BUEs placed on therapists shoulders positioned directly in front of pt to facilitate midline orientation, pt able to sit briefly with only CGA but mostly required at least MIN- MODA for static sitting balance d/t left lean Postural control: Left lateral lean Standing balance support: Reliant on assistive device for balance (and external assist) Standing  balance-Leahy Scale: Zero                             ADL  either performed or assessed with clinical judgement   ADL Overall ADL's : Needs assistance/impaired                 Upper Body Dressing : Maximal assistance;Sitting Upper Body Dressing Details (indicate cue type and reason): to don gown as back side cover     Toilet Transfer: Maximal assistance;+2 for physical assistance Toilet Transfer Details (indicate cue type and reason): to stand to stedy only         Functional mobility during ADLs: Maximal assistance;+2 for physical assistance;+2 for safety/equipment (stand to stedy only) General ADL Comments: ADL particpation impacted by impaired balance, impaired motor planning and impaired cognition    Extremity/Trunk Assessment Upper Extremity Assessment Upper Extremity Assessment: Generalized weakness RUE Deficits / Details: overall WFL, generally weak   Lower Extremity Assessment Lower Extremity Assessment: Generalized weakness (pain patches on both knees, suspect arthritis, provided pt with pillow at knees for comfort)        Vision Baseline Vision/History: 0 No visual deficits     Perception Perception Perception: Impaired Preception Impairment Details: Inattention/Neglect Perception-Other Comments: L inattention but improved from eval   Praxis Praxis Praxis: Impaired Praxis Impairment Details: Motor planning    Cognition Arousal: Alert Behavior During Therapy: WFL for tasks assessed/performed Overall Cognitive Status: No family/caregiver present to determine baseline cognitive functioning Area of Impairment: Orientation, Attention, Memory, Following commands, Safety/judgement, Awareness, Problem solving                 Orientation Level: Situation (asking who was in her house and asking for items that were not present) Current Attention Level: Sustained Memory: Decreased recall of precautions, Decreased short-term memory Following Commands: Follows one step commands with increased time Safety/Judgement:  Decreased awareness of safety, Decreased awareness of deficits Awareness: Intellectual Problem Solving: Slow processing, Decreased initiation, Difficulty sequencing, Requires verbal cues, Requires tactile cues General Comments: slow to process and needs step by step cues to problem solve mobility tasks,        Exercises      Shoulder Instructions       General Comments pt on 2L [place] with Spo2 99% HR 111 bpm post session    Pertinent Vitals/ Pain       Pain Assessment Pain Assessment: No/denies pain  Home Living                                          Prior Functioning/Environment              Frequency  Min 1X/week        Progress Toward Goals  OT Goals(current goals can now be found in the care plan section)  Progress towards OT goals: Progressing toward goals  Acute Rehab OT Goals Patient Stated Goal: to get OOB OT Goal Formulation: With patient Time For Goal Achievement: 02/13/23 Potential to Achieve Goals: Fair  Plan      Co-evaluation                 AM-PAC OT 6 Clicks Daily Activity     Outcome Measure   Help from another person eating meals?: A Lot Help from another person taking care of personal grooming?: A  Lot Help from another person toileting, which includes using toliet, bedpan, or urinal?: A Lot Help from another person bathing (including washing, rinsing, drying)?: A Lot Help from another person to put on and taking off regular upper body clothing?: A Lot Help from another person to put on and taking off regular lower body clothing?: A Lot 6 Click Score: 12    End of Session Equipment Utilized During Treatment: Gait belt;Oxygen;Other (comment) (stedy;2L)  OT Visit Diagnosis: Unsteadiness on feet (R26.81);Muscle weakness (generalized) (M62.81)   Activity Tolerance Patient tolerated treatment well   Patient Left in chair;with call bell/phone within reach;with chair alarm set   Nurse Communication Mobility  status;Need for lift equipment;Other (comment) (stedy or maximove back to bed)        Time: 8567-8542 OT Time Calculation (min): 25 min  Charges: OT General Charges $OT Visit: 1 Visit OT Treatments $Self Care/Home Management : 23-37 mins Ronal Mallie POUR., COTA/L Acute Rehabilitation Services [phone]   Ronal Mallie Needy 02/07/2023, 3:30 PM

## 2023-02-07 NOTE — TOC Transition Note (Signed)
 Transition of Care [facility]) - Discharge Note   Patient Details  Name: Jocelyn [name] MRN: [id number] Date of Birth: 02/13/1931  [facility] CM/SW Contact:  Luann SHAUNNA Cumming, LCSW Phone Number: 02/07/2023, 3:45 PM   Clinical Narrative:     [name] approved 1/7- 1/9 Auth PI#[id number]   Per MD patient ready for DC to [facility]. RN, patient, patient's family, and facility notified of DC. Discharge Summary and FL2 sent to facility. RN to call report prior to discharge [phone]). DC packet on chart. Ambulance transport requested for patient.   CSW will sign off for now as social work intervention is no longer needed. Please consult us  again if new needs arise.   Final next level of care: Skilled Nursing Facility Barriers to Discharge: Continued Medical Work up, English As A Second Language Teacher   Patient Goals and CMS Choice            Discharge Placement              Patient chooses bed at: [facility] Patient to be transferred to facility by: [facility] Name of family member notified: daughter, Garen Patient and family notified of of transfer: 02/07/23  Discharge Plan and Services Additional resources added to the After Visit Summary for                                       Social Drivers of Health (SDOH) Interventions SDOH Screenings   Food Insecurity: Patient Unable To Answer (01/14/2023)  Housing: Patient Unable To Answer (01/14/2023)  Transportation Needs: Unknown (01/14/2023)  Utilities: Patient Unable To Answer (01/14/2023)  Alcohol Screen: Low Risk  (12/03/2018)  Depression (PHQ2-9): Low Risk  (12/06/2022)  Financial Resource Strain: Medium Risk (07/26/2018)  Physical Activity: Inactive (07/26/2018)  Social Connections: Moderately Isolated (07/26/2018)  Stress: No Stress Concern Present (07/26/2018)  Tobacco Use: Low Risk  (01/27/2023)     Readmission Risk Interventions     No data to display

## 2023-02-07 NOTE — TOC Progression Note (Signed)
 Transition of Care [facility] - Progression Note    Patient Details  Name: Jocelyn [name] MRN: [id number] Date of Birth: 09/08/1931  [facility]) CM/SW Contact  Luann SHAUNNA Cumming, [name] Phone Number: 02/07/2023, 12:16 PM  Clinical Narrative:     CSW called pt's daughter, Garen, and provided SNF bed offers. She is initially interested in [facility] though called back stating that her sister is also interested in [facility]. They are going to visit facility and contact CSW after with choice.   1100: CSW received voicemail from [facility] stating that her and her sister have decided they would like pt go to [facility].   [facility] has confirmed bed offer. CSW added facility to pt's SNF auth request. [name] is pending.   Expected Discharge Plan: Skilled Nursing Facility Barriers to Discharge: Continued Medical Work up, English As A Second Language Teacher  Expected Discharge Plan and Services         Expected Discharge Date: 02/02/23                                     Social Determinants of Health (SDOH) Interventions SDOH Screenings   Food Insecurity: Patient Unable To Answer (01/14/2023)  Housing: Patient Unable To Answer (01/14/2023)  Transportation Needs: Unknown (01/14/2023)  Utilities: Patient Unable To Answer (01/14/2023)  Alcohol Screen: Low Risk  (12/03/2018)  Depression (PHQ2-9): Low Risk  (12/06/2022)  Financial Resource Strain: Medium Risk (07/26/2018)  Physical Activity: Inactive (07/26/2018)  Social Connections: Moderately Isolated (07/26/2018)  Stress: No Stress Concern Present (07/26/2018)  Tobacco Use: Low Risk  (01/27/2023)    Readmission Risk Interventions     No data to display

## 2023-02-07 NOTE — Plan of Care (Signed)
  Problem: Education: Goal: Knowledge of disease or condition will improve Outcome: Progressing   Problem: Education: Goal: Knowledge of disease or condition will improve Outcome: Progressing   Problem: Nutrition: Goal: Risk of aspiration will decrease Outcome: Progressing

## 2023-02-09 ENCOUNTER — Telehealth: Payer: Self-pay

## 2023-02-09 NOTE — Telephone Encounter (Signed)
 Unable to lmtrc 1st attempt Kel[facility] self number. Was able to r/s with Dr. Vickey Huger instead since she had an opening

## 2023-02-09 NOTE — Telephone Encounter (Signed)
 Patient scheduled incorrectly. Needs to be with stroke clinic MD Penumalli, Yan, Sethi or Camara. Patient is a resident at [facility] called and had to leave a message with front desk for Diane in transportation to give us  a call back top reschedule this appointment.

## 2023-02-13 ENCOUNTER — Ambulatory Visit: Payer: 59 | Admitting: Neurology

## 2023-03-07 ENCOUNTER — Ambulatory Visit: Payer: 59 | Admitting: Physician Assistant

## 2023-03-08 ENCOUNTER — Ambulatory Visit (INDEPENDENT_AMBULATORY_CARE_PROVIDER_SITE_OTHER): Payer: 59 | Admitting: Physician Assistant

## 2023-03-08 ENCOUNTER — Encounter: Payer: Self-pay | Admitting: Physician Assistant

## 2023-03-08 ENCOUNTER — Inpatient Hospital Stay: Payer: 59 | Admitting: Neurology

## 2023-03-08 DIAGNOSIS — M79676 Pain in unspecified toe(s): Secondary | ICD-10-CM

## 2023-03-08 DIAGNOSIS — G8929 Other chronic pain: Secondary | ICD-10-CM

## 2023-03-08 NOTE — Progress Notes (Signed)
 Office Visit Note   Patient: Jocelyn [name]           Date of Birth: 03/12/1931           MRN: [id number] Visit Date: 03/08/2023              Requested by: No referring provider defined for this encounter. PCP: [name], PA-C (Inactive)   Assessment & Plan: Visit Diagnoses: No diagnosis found.  Plan: no show  Follow-Up Instructions: No follow-ups on file.   Orders:  No orders of the defined types were placed in this encounter.  No orders of the defined types were placed in this encounter.     Procedures: No procedures performed   Clinical Data: No additional findings.   Subjective: No chief complaint on file.   HPI  Review of Systems  All other systems reviewed and are negative.    Objective: Vital Signs: There were no vitals taken for this visit.  Physical Exam Constitutional:      Appearance: Normal appearance.  Pulmonary:     Effort: Pulmonary effort is normal.  Skin:    General: Skin is warm and dry.  Neurological:     General: No focal deficit present.     Mental Status: She is alert and oriented to person, place, and time.  Psychiatric:        Mood and Affect: Mood normal.        Behavior: Behavior normal.     Ortho Exam  Specialty Comments:  No specialty comments available.  Imaging: No results found.   PMFS History: Patient Active Problem List   Diagnosis Date Noted   Abdominal wall abscess 01/31/2023   Bacteremia due to Enterobacter species 01/31/2023   Aspiration pneumonia (HCC) 01/31/2023   Dysphagia 01/31/2023   Oral candidiasis 01/31/2023   Transaminitis 01/31/2023   Chronic a-fib (HCC) 01/31/2023   Pressure injury of skin 01/31/2023   Malnutrition of moderate degree 01/16/2023   ICH (intracerebral hemorrhage) (HCC) 01/14/2023   Acute ischemic stroke (HCC) 12/29/2022   Coronary artery disease of native artery of native heart with stable angina pectoris (HCC) 04/21/2022   Coronary artery calcification  10/27/2021   Aortic atherosclerosis (HCC) 10/27/2021   Lumbar facet arthropathy 03/22/2018   SI joint arthritis (HCC) 03/22/2018   Chronic pain of both knees 03/22/2018   Chronic pain syndrome 03/22/2018   Right sided colitis 12/30/2017   Impaired fasting glucose 02/06/2017   Carpal tunnel syndrome on right 09/05/2015   Gastroesophageal reflux disease without esophagitis 09/05/2015   Hx of iron deficiency anemia 09/02/2015   OSA on CPAP 08/01/2015   Bilateral primary osteoarthritis of knee 08/01/2015   Controlled substance agreement signed 07/08/2015   Essential hypertension 07/28/2014   Lumbar degenerative disc disease 07/28/2014   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   Stable angina (HCC) 07/10/2013   Hyperlipidemia 07/10/2013   Cerebrovascular accident (CVA) (HCC) 07/10/2013   Dyspnea on exertion 07/10/2013   Hearing loss 01/29/2009   Arthritis sicca 05/23/2006   Past Medical History:  Diagnosis Date   Arthritis of both knees 08/01/2015   Colitis    Essential hypertension, benign 07/28/2014   GERD (gastroesophageal reflux disease)    Hx of iron deficiency anemia 09/02/2015   Hyperlipidemia    Hypertension    Lumbar pain    Medicare annual wellness visit, subsequent 04/07/2017   OSA on CPAP 08/01/2015   Overweight (BMI 25.0-29.9) 07/10/2013   Prediabetes 09/02/2015   SOB (shortness of breath) 07/10/2013  Family History  Problem Relation Age of Onset   Diabetes Mother    Heart disease Mother    Cancer Father        lung; smoker   Hypertension Daughter    Diabetes Daughter    Cancer Daughter        breast   Anuerysm Son    Breast cancer Neg Hx     Past Surgical History:  Procedure Laterality Date   ABDOMINAL HYSTERECTOMY     CATARACT EXTRACTION     CHOLECYSTECTOMY     COLONOSCOPY WITH PROPOFOL  N/A 04/09/2018   Procedure: COLONOSCOPY WITH PROPOFOL ;  Surgeon: Unk Corinn Skiff, MD;  Location: [facility] ENDOSCOPY;  Service: Gastroenterology;  Laterality: N/A;    ESOPHAGOGASTRODUODENOSCOPY N/A 01/09/2023   Procedure: ESOPHAGOGASTRODUODENOSCOPY (EGD);  Surgeon: Paola Dreama SAILOR, MD;  Location: [facility] ENDOSCOPY;  Service: General;  Laterality: N/A;   IR CT HEAD LTD  12/29/2022   IR PERCUTANEOUS ART THROMBECTOMY/INFUSION INTRACRANIAL INC DIAG ANGIO  12/29/2022   IR REPLC GASTRO/COLONIC TUBE PERCUT W/FLUORO  01/16/2023   IR US  GUIDE VASC ACCESS RIGHT  12/29/2022   IR US  GUIDE VASC ACCESS RIGHT  12/29/2022   IRRIGATION AND DEBRIDEMENT ABDOMEN N/A 01/27/2023   Procedure: IRRIGATION AND DEBRIDEMENT ABDOMEN wall abcess;  Surgeon: Ann Fine, MD;  Location: [facility] OR;  Service: General;  Laterality: N/A;   LUMBAR DISC SURGERY     PEG PLACEMENT N/A 01/09/2023   Procedure: PERCUTANEOUS ENDOSCOPIC GASTROSTOMY (PEG) PLACEMENT;  Surgeon: Paola Dreama SAILOR, MD;  Location: [facility] ENDOSCOPY;  Service: General;  Laterality: N/A;   RADIOLOGY WITH ANESTHESIA N/A 12/29/2022   Procedure: IR WITH ANESTHESIA;  Surgeon: Radiologist, Medication, MD;  Location: [facility] OR;  Service: Radiology;  Laterality: N/A;   Social History   Occupational History   Not on file  Tobacco Use   Smoking status: Never   Smokeless tobacco: Never  Vaping Use   Vaping status: Never Used  Substance and Sexual Activity   Alcohol use: No    Alcohol/week: 0.0 standard drinks of alcohol   Drug use: No   Sexual activity: Not Currently

## 2023-03-09 ENCOUNTER — Other Ambulatory Visit: Payer: Self-pay | Admitting: *Deleted

## 2023-03-09 DIAGNOSIS — I700 Atherosclerosis of aorta: Secondary | ICD-10-CM

## 2023-03-09 NOTE — Progress Notes (Signed)
 [name] ID: Jocelyn [name], female   DOB: 04-May-1931, 89 y.o.   MRN: [id number]  Reason for Consult: New [name] (Initial Visit)   Referred by Jocelyn Jacobsen, NP  Subjective:     HPI  Jocelyn [name] is a 89 y.o. female tired presenting for evaluation of PAD in setting of a nonhealing left heel wound. She resides at a rehab facility with granddaughter who accompanies Jocelyn today explains that there may be plans in Jocelyn near future for Jocelyn to come home.  She is not ambulatory and has had a stroke.  Jocelyn granddaughter explains that she does have constant pain in Jocelyn left heel although they are unable to offload much as she is pain with Jocelyn [name] boots as well.  She is a former smoker.  Past Medical History:  Diagnosis Date   Arthritis of both knees 08/01/2015   Colitis    Essential hypertension, benign 07/28/2014   GERD (gastroesophageal reflux disease)    Hx of iron deficiency anemia 09/02/2015   Hyperlipidemia    Hypertension    Lumbar pain    Medicare annual wellness visit, subsequent 04/07/2017   OSA on CPAP 08/01/2015   Overweight (BMI 25.0-29.9) 07/10/2013   Prediabetes 09/02/2015   SOB (shortness of breath) 07/10/2013   Family History  Problem Relation Age of Onset   Diabetes Mother    Heart disease Mother    Cancer Father        lung; smoker   Hypertension Daughter    Diabetes Daughter    Cancer Daughter        breast   Jocelyn [name] Son    Breast cancer Neg Hx    Past Surgical History:  Procedure Laterality Date   ABDOMINAL HYSTERECTOMY     CATARACT EXTRACTION     CHOLECYSTECTOMY     COLONOSCOPY WITH PROPOFOL  N/A 04/09/2018   Procedure: COLONOSCOPY WITH PROPOFOL ;  Surgeon: Unk Jocelyn Skiff, MD;  Location: [facility] ENDOSCOPY;  Service: Gastroenterology;  Laterality: N/A;   ESOPHAGOGASTRODUODENOSCOPY N/A 01/09/2023   Procedure: ESOPHAGOGASTRODUODENOSCOPY (EGD);  Surgeon: Jocelyn Dreama SAILOR, MD;  Location: [facility] ENDOSCOPY;  Service: General;  Laterality: N/A;   IR CT HEAD LTD   12/29/2022   IR PERCUTANEOUS ART THROMBECTOMY/INFUSION INTRACRANIAL INC DIAG ANGIO  12/29/2022   IR REPLC GASTRO/COLONIC TUBE PERCUT W/FLUORO  01/16/2023   IR US  GUIDE VASC ACCESS RIGHT  12/29/2022   IR US  GUIDE VASC ACCESS RIGHT  12/29/2022   IRRIGATION AND DEBRIDEMENT ABDOMEN N/A 01/27/2023   Procedure: IRRIGATION AND DEBRIDEMENT ABDOMEN wall abcess;  Surgeon: Jocelyn Fine, MD;  Location: [facility] OR;  Service: General;  Laterality: N/A;   LUMBAR DISC SURGERY     PEG PLACEMENT N/A 01/09/2023   Procedure: PERCUTANEOUS ENDOSCOPIC GASTROSTOMY (PEG) PLACEMENT;  Surgeon: Jocelyn Dreama SAILOR, MD;  Location: [facility] ENDOSCOPY;  Service: General;  Laterality: N/A;   RADIOLOGY WITH ANESTHESIA N/A 12/29/2022   Procedure: IR WITH ANESTHESIA;  Surgeon: Radiologist, Medication, MD;  Location: [facility] OR;  Service: Radiology;  Laterality: N/A;    Short Social History:  Social History   Tobacco Use   Smoking status: Never   Smokeless tobacco: Never  Substance Use Topics   Alcohol use: No    Alcohol/week: 0.0 standard drinks of alcohol    No Known Allergies  Current Outpatient Medications  Medication Sig Dispense Refill   acetaminophen  (TYLENOL ) 325 MG tablet Take 2 tablets (650 mg total) by mouth every 4 (four) hours as needed for mild pain (pain score 1-3) (or  temp > 37.5 C (99.5 F)). ([name] taking differently: Place 650 mg into feeding tube every 4 (four) hours as needed for mild pain (pain score 1-3) (or temp > 37.5 C (99.5 F)).) 30 tablet 0   amLODipine  (NORVASC ) 10 MG tablet Place 1 tablet (10 mg total) into feeding tube daily. 30 tablet 0   ascorbic acid  (VITAMIN C ) 250 MG tablet Place 1 tablet (250 mg total) into feeding tube daily.     atorvastatin  (LIPITOR) 40 MG tablet Place 1 tablet (40 mg total) into feeding tube daily. 30 tablet 0   calcium  citrate-vitamin D  500-400 MG-UNIT chewable tablet Place 1 tablet into feeding tube 2 (two) times daily.     famotidine  (PEPCID ) 20 MG tablet Place 1 tablet  (20 mg total) into feeding tube daily. 30 tablet 0   ferrous sulfate  300 (60 Fe) MG/5ML syrup Place 325 mg into feeding tube daily.     fiber supplement, BANATROL TF, liquid Place 60 mLs into feeding tube 2 (two) times daily.     HYDROcodone -acetaminophen  (NORCO/VICODIN) 5-325 MG tablet Take 1 tablet by mouth every 6 (six) hours as needed for moderate pain (pain score 4-6). 10 tablet 0   ipratropium-albuterol  (DUONEB) 0.5-2.5 (3) MG/3ML SOLN Take 3 mLs by nebulization every 6 (six) hours as needed. 360 mL 0   lidocaine  (LIDODERM ) 5 % Place 2 patches onto Jocelyn skin daily. Remove & Discard patch within 12 hours or as directed by MD 14 patch 0   liver oil-zinc  oxide (DESITIN) 40 % ointment Apply topically 3 (three) times daily. Apply to perianal region, buttock, groin after each stool 56.7 g 0   Mouthwashes (MOUTH RINSE) LIQD solution 15 mLs by Mouth Rinse route as needed (oral care).     Multiple Vitamin (MULTIVITAMIN) LIQD Place 15 mLs into feeding tube daily.     Nutritional Supplements (FEEDING SUPPLEMENT, OSMOLITE 1.2 CAL,) LIQD Place 1,000 mLs into feeding tube continuous. 1000 mL 0   Water  For Irrigation, Sterile (FREE WATER ) SOLN Place 100 mLs into feeding tube every 4 (four) hours. 100 mL 0   white petrolatum  (VASELINE) OINT Apply 1 Application topically as needed for lip care. 1 packet 0   No current facility-administered medications for this visit.    REVIEW OF SYSTEMS   All other systems were reviewed and are negative     Objective:  Objective   Vitals:   03/10/23 1437  BP: 128/74  Pulse: (!) 103  Resp: 20  Temp: 98 F (36.7 C)  SpO2: 93%   There is no height or weight on file to calculate BMI.  Physical Exam General: no acute distress Cardiac: hemodynamically stable Pulm: normal work of breathing Neuro: alert, no focal deficit Extremities: Full-thickness left heel wound with areas of dry eschar and  necrosis.   Data: ABI +---------+------------------+-----+----------+----------------+  Left    Lt Pressure (mmHg)IndexWaveform  Comment           +---------+------------------+-----+----------+----------------+  PTA                            monophasicnon-compressible  +---------+------------------+-----+----------+----------------+  DP                             monophasicnon-compressible  +---------+------------------+-----+----------+----------------+  Great Toe                       Absent                      +---------+------------------+-----+----------+----------------+   +-------+------------+------------+------------+------------+  ABI/TBIToday's ABI Today's TBI Previous ABIPrevious TBI  +-------+------------+------------+------------+------------+  Right not obtainednot obtained                          +-------+------------+------------+------------+------------+  Left  Forest          absent                                +-------+------------+------------+------------+------------+   Echo reviewed EF 60 to 65%, RSVP 40, left atrium severely dilated, right atrium mildly dilated.  CMP reviewed, creatinine 0.61      Assessment/Plan:     Jocelyn [name] is a 89 y.o. female with history of HTN, HLD, OSA presenting for evaluation of a nonhealing wound.  Explained that Jocelyn ABI demonstrates perfusion and adequate for healing.  I had a frank discussion with Jocelyn [name] and Jocelyn granddaughter and explained that heel ulcerations are extremely difficult to heal and also explained that even if we were able to obtain perfect blood flow that Jocelyn left heel wound is still very unlikely to heal at its current state. I explained that Jocelyn [name] is not a candidate for revascularization given Jocelyn medical comorbidities and nonambulatory status.  We discussed Jocelyn 2 options of above-knee amputation versus continued wound care and comfort.  I explained  that I reserve above-knee amputations only for patients with wound infections or constant pain. Jocelyn granddaughter expressed understanding and would like to discuss with Jocelyn family members and follow-up with Jocelyn [name], Jocelyn [name]'s daughter in Jocelyn next few weeks to rediscuss   Recommendations to optimize cardiovascular risk: Abstinence from all tobacco products. Blood glucose control with goal A1c < 7%. Blood pressure control with goal blood pressure < 140/90 mmHg. Lipid reduction therapy with goal LDL-C <100 mg/dL  Aspirin  81mg  PO QD.  Atorvastatin  40-80mg  PO QD (or other high intensity statin therapy).     Norman GORMAN Serve MD [facility]

## 2023-03-10 ENCOUNTER — Ambulatory Visit (INDEPENDENT_AMBULATORY_CARE_PROVIDER_SITE_OTHER): Payer: 59 | Admitting: Vascular Surgery

## 2023-03-10 ENCOUNTER — Encounter: Payer: Self-pay | Admitting: Vascular Surgery

## 2023-03-10 ENCOUNTER — Ambulatory Visit (HOSPITAL_COMMUNITY)
Admission: RE | Admit: 2023-03-10 | Discharge: 2023-03-10 | Disposition: A | Discharge status: Home / Self Care | Payer: 59 | Source: Ambulatory Visit | Attending: Vascular Surgery | Admitting: Vascular Surgery

## 2023-03-10 VITALS — BP 128/74 | HR 103 | Temp 98.0°F | Resp 20

## 2023-03-10 DIAGNOSIS — I70222 Atherosclerosis of native arteries of extremities with rest pain, left leg: Secondary | ICD-10-CM

## 2023-03-10 DIAGNOSIS — I700 Atherosclerosis of aorta: Secondary | ICD-10-CM | POA: Insufficient documentation

## 2023-03-10 LAB — VAS US ABI WITH/WO TBI

## 2023-03-13 ENCOUNTER — Ambulatory Visit: Payer: 59 | Admitting: Physician Assistant

## 2023-03-14 ENCOUNTER — Encounter: Payer: Self-pay | Admitting: Physician Assistant

## 2023-03-14 ENCOUNTER — Ambulatory Visit (INDEPENDENT_AMBULATORY_CARE_PROVIDER_SITE_OTHER): Payer: 59 | Admitting: Physician Assistant

## 2023-03-14 DIAGNOSIS — L97422 Non-pressure chronic ulcer of left heel and midfoot with fat layer exposed: Secondary | ICD-10-CM | POA: Diagnosis not present

## 2023-03-14 NOTE — Progress Notes (Signed)
Office Visit Note   Patient: Jocelyn [name]           Date of Birth: 02-11-1931           MRN: [id number] Visit Date: 03/14/2023              Requested by: No referring provider defined for this encounter. PCP: Gorden Harms, PA-C (Inactive)   Assessment & Plan: Visit Diagnoses:  1. Heel ulcer, left, with fat layer exposed (HCC)     Plan: Patient is a pleasant 89 year old woman who is accompanied by her daughter.  She resides in a nursing home and has a history of stroke and multiple other comorbidities.  Most recently she was hospitalized in January.  At that time she had development of a pressure ulcer on her left heel and Achilles.  Daughter does not recall having this prior to that.  Facility has been doing dry dressing changes.  She did visit with vein and vascular and ABIs did not show her to be a candidate for revascularization procedures and they told her daughter that quite frankly they ulcer had no to limited chance of healing much.  Other option was not above-knee amputation.  Daughter has discussed it with the family they are still debating.  For now they are seeking chronic wound care.  The heel ulcer does go into the fat layer there is upper ulcer at the Achilles that is very close to having exposed tendon.  However there is no cellulitis there is no foul odor there is no sign of infection currently.  I have asked that the nursing home begin daily cleansing with antibacterial soap and water.  Can then place a thin layer of Silvadene to the exposed areas followed by a dressing.  This should be done daily.  Would like her to follow-up with Dr. Lajoyce Corners  Follow-Up Instructions: Return in about 1 week (around 03/21/2023).   Orders:  No orders of the defined types were placed in this encounter.  No orders of the defined types were placed in this encounter.     Procedures: No procedures performed   Clinical Data: No additional findings.   Subjective: Chief Complaint   Patient presents with   Left Heel - Pain, Wound Check    Wound Check    Review of Systems  All other systems reviewed and are negative.    Objective: Vital Signs: There were no vitals taken for this visit.  Physical Exam Pulmonary:     Effort: Pulmonary effort is normal.  Skin:    General: Skin is warm and dry.  Neurological:     Mental Status: She is alert. Mental status is at baseline.     Ortho Exam Examination of her left lower extremities her compartments are soft and nontender no swelling no cellulitis she does have a PRAFO boot on and a dressing over her heel and Achilles these were removed.  Pictures attached.  Compartments are soft compressible no sign of infection today.  No purulent drainage or foul odor Specialty Comments:  No specialty comments available.  Imaging: No results found.   PMFS History: Patient Active Problem List   Diagnosis Date Noted   Heel ulcer, left, with fat layer exposed (HCC) 03/14/2023   Abdominal wall abscess 01/31/2023   Bacteremia due to Enterobacter species 01/31/2023   Aspiration pneumonia (HCC) 01/31/2023   Dysphagia 01/31/2023   Oral candidiasis 01/31/2023   Transaminitis 01/31/2023   Chronic a-fib (HCC) 01/31/2023   Pressure injury  of skin 01/31/2023   Malnutrition of moderate degree 01/16/2023   ICH (intracerebral hemorrhage) (HCC) 01/14/2023   Acute ischemic stroke (HCC) 12/29/2022   Coronary artery disease of native artery of native heart with stable angina pectoris (HCC) 04/21/2022   Coronary artery calcification 10/27/2021   Aortic atherosclerosis (HCC) 10/27/2021   Lumbar facet arthropathy 03/22/2018   SI joint arthritis (HCC) 03/22/2018   Chronic pain of both knees 03/22/2018   Chronic pain syndrome 03/22/2018   Right sided colitis 12/30/2017   Impaired fasting glucose 02/06/2017   Carpal tunnel syndrome on right 09/05/2015   Gastroesophageal reflux disease without esophagitis 09/05/2015   Hx of iron  deficiency anemia 09/02/2015   OSA on CPAP 08/01/2015   Bilateral primary osteoarthritis of knee 08/01/2015   Controlled substance agreement signed 07/08/2015   Essential hypertension 07/28/2014   Lumbar degenerative disc disease 07/28/2014   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   Stable angina (HCC) 07/10/2013   Hyperlipidemia 07/10/2013   Cerebrovascular accident (CVA) (HCC) 07/10/2013   Dyspnea on exertion 07/10/2013   Hearing loss 01/29/2009   Arthritis sicca 05/23/2006   Past Medical History:  Diagnosis Date   Arthritis of both knees 08/01/2015   Colitis    Essential hypertension, benign 07/28/2014   GERD (gastroesophageal reflux disease)    Hx of iron deficiency anemia 09/02/2015   Hyperlipidemia    Hypertension    Lumbar pain    Medicare annual wellness visit, subsequent 04/07/2017   OSA on CPAP 08/01/2015   Overweight (BMI 25.0-29.9) 07/10/2013   Prediabetes 09/02/2015   SOB (shortness of breath) 07/10/2013    Family History  Problem Relation Age of Onset   Diabetes Mother    Heart disease Mother    Cancer Father        lung; smoker   Hypertension Daughter    Diabetes Daughter    Cancer Daughter        breast   Anuerysm Son    Breast cancer Neg Hx     Past Surgical History:  Procedure Laterality Date   ABDOMINAL HYSTERECTOMY     CATARACT EXTRACTION     CHOLECYSTECTOMY     COLONOSCOPY WITH PROPOFOL N/A 04/09/2018   Procedure: COLONOSCOPY WITH PROPOFOL;  Surgeon: Toney Reil, MD;  Location: [facility] ENDOSCOPY;  Service: Gastroenterology;  Laterality: N/A;   ESOPHAGOGASTRODUODENOSCOPY N/A 01/09/2023   Procedure: ESOPHAGOGASTRODUODENOSCOPY (EGD);  Surgeon: Diamantina Monks, MD;  Location: [facility] ENDOSCOPY;  Service: General;  Laterality: N/A;   IR CT HEAD LTD  12/29/2022   IR PERCUTANEOUS ART THROMBECTOMY/INFUSION INTRACRANIAL INC DIAG ANGIO  12/29/2022   IR REPLC GASTRO/COLONIC TUBE PERCUT W/FLUORO  01/16/2023   IR US GUIDE VASC ACCESS RIGHT  12/29/2022   IR US GUIDE VASC ACCESS  RIGHT  12/29/2022   IRRIGATION AND DEBRIDEMENT ABDOMEN N/A 01/27/2023   Procedure: IRRIGATION AND DEBRIDEMENT ABDOMEN wall abcess;  Surgeon: Lysle Rubens, MD;  Location: [facility] OR;  Service: General;  Laterality: N/A;   LUMBAR DISC SURGERY     PEG PLACEMENT N/A 01/09/2023   Procedure: PERCUTANEOUS ENDOSCOPIC GASTROSTOMY (PEG) PLACEMENT;  Surgeon: Diamantina Monks, MD;  Location: [facility] ENDOSCOPY;  Service: General;  Laterality: N/A;   RADIOLOGY WITH ANESTHESIA N/A 12/29/2022   Procedure: IR WITH ANESTHESIA;  Surgeon: Radiologist, Medication, MD;  Location: [facility] OR;  Service: Radiology;  Laterality: N/A;   Social History   Occupational History   Not on file  Tobacco Use   Smoking status: Never   Smokeless tobacco: Never  Vaping  Use   Vaping status: Never Used  Substance and Sexual Activity   Alcohol use: No    Alcohol/week: 0.0 standard drinks of alcohol   Drug use: No   Sexual activity: Not Currently

## 2023-03-14 NOTE — Progress Notes (Signed)
Office Visit Note   Patient: Jocelyn [name]           Date of Birth: [birth year]           MRN: [id number] Visit Date: 03/14/2023              Requested by: No referring provider defined for this encounter. PCP: Gorden Harms, PA-C (Inactive)   Assessment & Plan: Visit Diagnoses: No diagnosis found.  Plan: She notes an  Follow-Up Instructions: No follow-ups on file.   Orders:  No orders of the defined types were placed in this encounter.  No orders of the defined types were placed in this encounter.     Procedures: No procedures performed   Clinical Data: No additional findings.   Subjective: Chief Complaint  Patient presents with   Left Heel - Pain, Wound Check    HPI  Review of Systems   Objective: Vital Signs: There were no vitals taken for this visit.  Physical Exam  Ortho Exam  Specialty Comments:  No specialty comments available.  Imaging: No results found.   PMFS History: Patient Active Problem List   Diagnosis Date Noted   Abdominal wall abscess 01/31/2023   Bacteremia due to Enterobacter species 01/31/2023   Aspiration pneumonia (HCC) 01/31/2023   Dysphagia 01/31/2023   Oral candidiasis 01/31/2023   Transaminitis 01/31/2023   Chronic a-fib (HCC) 01/31/2023   Pressure injury of skin 01/31/2023   Malnutrition of moderate degree 01/16/2023   ICH (intracerebral hemorrhage) (HCC) 01/14/2023   Acute ischemic stroke (HCC) 12/29/2022   Coronary artery disease of native artery of native heart with stable angina pectoris (HCC) 04/21/2022   Coronary artery calcification 10/27/2021   Aortic atherosclerosis (HCC) 10/27/2021   Lumbar facet arthropathy 03/22/2018   SI joint arthritis (HCC) 03/22/2018   Chronic pain of both knees 03/22/2018   Chronic pain syndrome 03/22/2018   Right sided colitis 12/30/2017   Impaired fasting glucose 02/06/2017   Carpal tunnel syndrome on right 09/05/2015   Gastroesophageal reflux disease without  esophagitis 09/05/2015   Hx of iron deficiency anemia 09/02/2015   OSA on CPAP 08/01/2015   Bilateral primary osteoarthritis of knee 08/01/2015   Controlled substance agreement signed 07/08/2015   Essential hypertension 07/28/2014   Lumbar degenerative disc disease 07/28/2014   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   Stable angina (HCC) 07/10/2013   Hyperlipidemia 07/10/2013   Cerebrovascular accident (CVA) (HCC) 07/10/2013   Dyspnea on exertion 07/10/2013   Hearing loss 01/29/2009   Arthritis sicca 05/23/2006   Past Medical History:  Diagnosis Date   Arthritis of both knees 08/01/2015   Colitis    Essential hypertension, benign 07/28/2014   GERD (gastroesophageal reflux disease)    Hx of iron deficiency anemia 09/02/2015   Hyperlipidemia    Hypertension    Lumbar pain    Medicare annual wellness visit, subsequent 04/07/2017   OSA on CPAP 08/01/2015   Overweight (BMI 25.0-29.9) 07/10/2013   Prediabetes 09/02/2015   SOB (shortness of breath) 07/10/2013    Family History  Problem Relation Age of Onset   Diabetes Mother    Heart disease Mother    Cancer Father        lung; smoker   Hypertension Daughter    Diabetes Daughter    Cancer Daughter        breast   Anuerysm Son    Breast cancer Neg Hx     Past Surgical History:  Procedure Laterality Date   ABDOMINAL HYSTERECTOMY  CATARACT EXTRACTION     CHOLECYSTECTOMY     COLONOSCOPY WITH PROPOFOL N/A 04/09/2018   Procedure: COLONOSCOPY WITH PROPOFOL;  Surgeon: Toney Reil, MD;  Location: [facility] ENDOSCOPY;  Service: Gastroenterology;  Laterality: N/A;   ESOPHAGOGASTRODUODENOSCOPY N/A 01/09/2023   Procedure: ESOPHAGOGASTRODUODENOSCOPY (EGD);  Surgeon: Diamantina Monks, MD;  Location: [facility] ENDOSCOPY;  Service: General;  Laterality: N/A;   IR CT HEAD LTD  12/29/2022   IR PERCUTANEOUS ART THROMBECTOMY/INFUSION INTRACRANIAL INC DIAG ANGIO  12/29/2022   IR REPLC GASTRO/COLONIC TUBE PERCUT W/FLUORO  01/16/2023   IR US GUIDE VASC ACCESS RIGHT   12/29/2022   IR US GUIDE VASC ACCESS RIGHT  12/29/2022   IRRIGATION AND DEBRIDEMENT ABDOMEN N/A 01/27/2023   Procedure: IRRIGATION AND DEBRIDEMENT ABDOMEN wall abcess;  Surgeon: Lysle Rubens, MD;  Location[facility] OR;  Service: General;  Laterality: N/A;   LUMBAR DISC SURGERY     PEG PLACEMENT N/A 01/09/2023   Procedure: PERCUTANEOUS ENDOSCOPIC GASTROSTOMY (PEG) PLACEMENT;  Surgeon: Diamantina Monks, MD;  Location: [facility] ENDOSCOPY;  Service: General;  Laterality: N/A;   RADIOLOGY WITH ANESTHESIA N/A 12/29/2022   Procedure: IR WITH ANESTHESIA;  Surgeon: Radiologist, Medication, MD;  Location: [facility] OR;  Service: Radiology;  Laterality: N/A;   Social History   Occupational History   Not on file  Tobacco Use   Smoking status: Never   Smokeless tobacco: Never  Vaping Use   Vaping status: Never Used  Substance and Sexual Activity   Alcohol use: No    Alcohol/week: 0.0 standard drinks of alcohol   Drug use: No   Sexual activity: Not Currently

## 2023-03-21 ENCOUNTER — Ambulatory Visit (INDEPENDENT_AMBULATORY_CARE_PROVIDER_SITE_OTHER): Payer: 59 | Admitting: Diagnostic Neuroimaging

## 2023-03-21 ENCOUNTER — Encounter: Payer: Self-pay | Admitting: Diagnostic Neuroimaging

## 2023-03-21 VITALS — BP 137/70 | HR 88 | Ht 62.99 in | Wt 142.0 lb

## 2023-03-21 DIAGNOSIS — Z8673 Personal history of transient ischemic attack (TIA), and cerebral infarction without residual deficits: Secondary | ICD-10-CM

## 2023-03-21 NOTE — Patient Instructions (Signed)
Continue BP control and statin Consider palliative care consult

## 2023-03-21 NOTE — Progress Notes (Signed)
[facility]  PATIENT: Jocelyn [name] DOB: Dec 31, 1931  REFERRING CLINICIAN: Marvel Plan, MD HISTORY FROM: patient  REASON FOR VISIT: new consult   HISTORICAL  CHIEF COMPLAINT:  Chief Complaint  Patient presents with   Follow-up    Pt with, rm 7, here for hospital follow up from stroke that occurred 12/29/23. She states that she is having complaints of pain on the left > right side. Pt states that she is working with therapy. Currently in nursing rehab    HISTORY OF PRESENT ILLNESS:   89 year old female with hypertension, hyperlipidemia, diabetes, atrial fibrillation, here for hospital stroke follow-up.  Patient was admitted 12/29/2022 until 01/11/2023 due to right MCA ischemic infarction, likely cardioembolic related to atrial fibrillation.  She was discharged to rehab.  She returned to the hospital a few days later due to recurrent left-sided stroke symptoms.  She was found to have scattered hemorrhagic transformation and subarachnoid blood in the right sylvian fissure.  Eliquis was reversed.  She was stabilized medically.  She was discharged back to skilled nursing facility for rehabilitation.  Since that time patient has continued to have memory and cognitive decline.  She is having significant immobility due to left-sided weakness.  She is also developed left heel wound ulcer that is not healing very well.   REVIEW OF SYSTEMS: Full 14 system review of systems performed and negative with exception of: as per HPI.  ALLERGIES: No Known Allergies  HOME MEDICATIONS: Outpatient Medications Prior to Visit  Medication Sig Dispense Refill   acetaminophen (TYLENOL) 325 MG tablet Take 2 tablets (650 mg total) by mouth every 4 (four) hours as needed for mild pain (pain score 1-3) (or temp > 37.5 C (99.5 F)). (Patient taking differently: Place 650 mg into feeding tube every 4 (four) hours as needed for mild pain (pain score 1-3) (or temp > 37.5 C (99.5 F)).) 30  tablet 0   amLODipine (NORVASC) 10 MG tablet Place 1 tablet (10 mg total) into feeding tube daily. 30 tablet 0   ascorbic acid (VITAMIN C) 250 MG tablet Place 1 tablet (250 mg total) into feeding tube daily.     atorvastatin (LIPITOR) 40 MG tablet Place 1 tablet (40 mg total) into feeding tube daily. 30 tablet 0   calcium citrate-vitamin D 500-400 MG-UNIT chewable tablet Place 1 tablet into feeding tube 2 (two) times daily.     famotidine (PEPCID) 20 MG tablet Place 1 tablet (20 mg total) into feeding tube daily. 30 tablet 0   ferrous sulfate 300 (60 Fe) MG/5ML syrup Place 325 mg into feeding tube daily.     fiber supplement, BANATROL TF, liquid Place 60 mLs into feeding tube 2 (two) times daily.     HYDROcodone-acetaminophen (NORCO/VICODIN) 5-325 MG tablet Take 1 tablet by mouth every 6 (six) hours as needed for moderate pain (pain score 4-6). 10 tablet 0   ipratropium-albuterol (DUONEB) 0.5-2.5 (3) MG/3ML SOLN Take 3 mLs by nebulization every 6 (six) hours as needed. 360 mL 0   lidocaine (LIDODERM) 5 % Place 2 patches onto the skin daily. Remove & Discard patch within 12 hours or as directed by MD 14 patch 0   liver oil-zinc oxide (DESITIN) 40 % ointment Apply topically 3 (three) times daily. Apply to perianal region, buttock, groin after each stool 56.7 g 0   Mouthwashes (MOUTH RINSE) LIQD solution 15 mLs by Mouth Rinse route as needed (oral care).     Multiple Vitamin (MULTIVITAMIN) LIQD Place 15 mLs into  feeding tube daily.     Nutritional Supplements (FEEDING SUPPLEMENT, OSMOLITE 1.2 CAL,) LIQD Place 1,000 mLs into feeding tube continuous. 1000 mL 0   Water For Irrigation, Sterile (FREE WATER) SOLN Place 100 mLs into feeding tube every 4 (four) hours. 100 mL 0   white petrolatum (VASELINE) OINT Apply 1 Application topically as needed for lip care. 1 packet 0   No facility-administered medications prior to visit.    PAST MEDICAL HISTORY: Past Medical History:  Diagnosis Date   Arthritis  of both knees 08/01/2015   Colitis    Essential hypertension, benign 07/28/2014   GERD (gastroesophageal reflux disease)    Hx of iron deficiency anemia 09/02/2015   Hyperlipidemia    Hypertension    Lumbar pain    Medicare annual wellness visit, subsequent 04/07/2017   OSA on CPAP 08/01/2015   Overweight (BMI 25.0-29.9) 07/10/2013   Prediabetes 09/02/2015   SOB (shortness of breath) 07/10/2013   Stroke (HCC)     PAST SURGICAL HISTORY: Past Surgical History:  Procedure Laterality Date   ABDOMINAL HYSTERECTOMY     CATARACT EXTRACTION     CHOLECYSTECTOMY     COLONOSCOPY WITH PROPOFOL N/A 04/09/2018   Procedure: COLONOSCOPY WITH PROPOFOL;  Surgeon: Toney Reil, MD;  Location: [facility] ENDOSCOPY;  Service: Gastroenterology;  Laterality: N/A;   ESOPHAGOGASTRODUODENOSCOPY N/A 01/09/2023   Procedure: ESOPHAGOGASTRODUODENOSCOPY (EGD);  Surgeon: Diamantina Monks, MD;  Location: [facility] ENDOSCOPY;  Service: General;  Laterality: N/A;   IR CT HEAD LTD  12/29/2022   IR PERCUTANEOUS ART THROMBECTOMY/INFUSION INTRACRANIAL INC DIAG ANGIO  12/29/2022   IR REPLC GASTRO/COLONIC TUBE PERCUT W/FLUORO  01/16/2023   IR US GUIDE VASC ACCESS RIGHT  12/29/2022   IR US GUIDE VASC ACCESS RIGHT  12/29/2022   IRRIGATION AND DEBRIDEMENT ABDOMEN N/A 01/27/2023   Procedure: IRRIGATION AND DEBRIDEMENT ABDOMEN wall abcess;  Surgeon: Lysle Rubens, MD;  Location: [facility] OR;  Service: General;  Laterality: N/A;   LUMBAR DISC SURGERY     PEG PLACEMENT N/A 01/09/2023   Procedure: PERCUTANEOUS ENDOSCOPIC GASTROSTOMY (PEG) PLACEMENT;  Surgeon: Diamantina Monks, MD;  Location: [facility] ENDOSCOPY;  Service: General;  Laterality: N/A;   RADIOLOGY WITH ANESTHESIA N/A 12/29/2022   Procedure: IR WITH ANESTHESIA;  Surgeon: Radiologist, Medication, MD;  Location: [facility] OR;  Service: Radiology;  Laterality: N/A;    FAMILY HISTORY: Family History  Problem Relation Age of Onset   Diabetes Mother    Heart disease Mother    Cancer Father         lung; smoker   Hypertension Daughter    Diabetes Daughter    Cancer Daughter        breast   Anuerysm Son    Breast cancer Neg Hx     SOCIAL HISTORY: Social History   Socioeconomic History   Marital status: Widowed    Spouse name: Not on file   Number of children: 5   Years of education: Not on file   Highest education level: Not on file  Occupational History   Not on file  Tobacco Use   Smoking status: Never   Smokeless tobacco: Never  Vaping Use   Vaping status: Never Used  Substance and Sexual Activity   Alcohol use: No    Alcohol/week: 0.0 standard drinks of alcohol   Drug use: No   Sexual activity: Not Currently  Other Topics Concern   Not on file  Social History Narrative   Not on file   Social Drivers of  Health   Financial Resource Strain: Medium Risk (07/26/2018)   Overall Financial Resource Strain (CARDIA)    Difficulty of Paying Living Expenses: Somewhat hard  Food Insecurity: Patient Unable To Answer (01/14/2023)   Hunger Vital Sign    Worried About Running Out of Food in the Last Year: Patient unable to answer    Ran Out of Food in the Last Year: Patient unable to answer  Transportation Needs: Unknown (01/14/2023)   PRAPARE - Transportation    Lack of Transportation (Medical): Patient unable to answer    Lack of Transportation (Non-Medical): No  Physical Activity: Inactive (07/26/2018)   Exercise Vital Sign    Days of Exercise per Week: 0 days    Minutes of Exercise per Session: 0 min  Stress: No Stress Concern Present (07/26/2018)   [employer] of Occupational Health - Occupational Stress Questionnaire    Feeling of Stress : Not at all  Social Connections: Moderately Isolated (07/26/2018)   Social Connection and Isolation Panel [NHANES]    Frequency of Communication with Friends and Family: More than three times a week    Frequency of Social Gatherings with Friends and Family: Three times a week    Attends Religious Services: More than  4 times per year    Active Member of Clubs or Organizations: No    Attends Banker Meetings: Never    Marital Status: Widowed  Intimate Partner Violence: Patient Unable To Answer (01/14/2023)   Humiliation, Afraid, Rape, and Kick questionnaire    Fear of Current or Ex-Partner: Patient unable to answer    Emotionally Abused: Patient unable to answer    Physically Abused: Patient unable to answer    Sexually Abused: Patient unable to answer     PHYSICAL EXAM  GENERAL EXAM/CONSTITUTIONAL: Vitals:  Vitals:   03/21/23 1229  BP: 137/70  Pulse: 88  Weight: 142 lb (64.4 kg)  Height: 5' 2.99" (1.6 m)   Body mass index is 25.16 kg/m. Wt Readings from Last 3 Encounters:  03/21/23 142 lb (64.4 kg)  01/26/23 149 lb 8 oz (67.8 kg)  01/13/23 153 lb 9.6 oz (69.7 kg)   Patient is in no distress; well developed, nourished and groomed; neck is supple  CARDIOVASCULAR: Examination of carotid arteries is normal; no carotid bruits Regular rate and rhythm, no murmurs Examination of peripheral vascular system by observation and palpation is normal  EYES: Ophthalmoscopic exam of optic discs and posterior segments is normal; no papilledema or hemorrhages No results found.  MUSCULOSKELETAL: Gait, strength, tone, movements noted in Neurologic exam below  NEUROLOGIC: MENTAL STATUS:      No data to display         awake, alert, oriented to person DECR FLUENCY, DECR COMPREHENSION DECR MEMORY  CRANIAL NERVE:  2nd, 3rd, 4th, 6th - pupils equal and reactive to light, visual fields full to confrontation, extraocular muscles intact, no nystagmus 5th - facial sensation symmetric 7th - facial strength symmetric 8th - hearing intact 9th - palate elevates symmetrically, uvula midline 11th - shoulder shrug symmetric 12th - tongue protrusion midline  MOTOR:  INCREASED TONE IN LUE AND LLE RUE 3, RLE 3 LUE 2-3; LLE 1-2  SENSORY:  normal and symmetric to light  touch  COORDINATION:  finger-nose-finger, fine finger movements SLOW ON RUE; LIMITED ON LEFT  REFLEXES:  deep tendon reflexes BRISK ON LEFT  GAIT/STATION:  IN TRANSPORT CHAIR     DIAGNOSTIC DATA (LABS, IMAGING, TESTING) - I reviewed patient records, labs, notes, testing  and imaging myself where available.  Lab Results  Component Value Date   WBC 9.7 02/06/2023   HGB 9.4 (L) 02/06/2023   HCT 29.6 (L) 02/06/2023   MCV 83.6 02/06/2023   PLT 319 02/06/2023      Component Value Date/Time   NA 134 (L) 02/06/2023 0621   NA 143 12/07/2022 1535   NA 141 04/13/2011 1125   K 5.2 (H) 02/06/2023 0621   K 3.4 (L) 04/13/2011 1125   CL 101 02/06/2023 0621   CL 105 04/13/2011 1125   CO2 26 02/06/2023 0621   CO2 29 04/13/2011 1125   GLUCOSE 119 (H) 02/06/2023 0621   GLUCOSE 111 (H) 04/13/2011 1125   BUN 14 02/06/2023 0621   BUN 16 12/07/2022 1535   BUN 14 04/13/2011 1125   CREATININE 0.61 02/06/2023 0621   CREATININE 0.80 08/01/2018 1544   CALCIUM 9.3 02/06/2023 0621   CALCIUM 9.2 04/13/2011 1125   PROT 6.8 02/06/2023 0621   PROT 7.6 12/07/2022 1535   PROT 7.8 04/13/2011 1125   ALBUMIN 2.3 (L) 02/06/2023 0621   ALBUMIN 4.4 12/07/2022 1535   ALBUMIN 4.0 04/13/2011 1125   AST 44 (H) 02/06/2023 0621   AST 24 04/13/2011 1125   ALT 28 02/06/2023 0621   ALT 20 04/13/2011 1125   ALKPHOS 64 02/06/2023 0621   ALKPHOS 51 04/13/2011 1125   BILITOT 0.5 02/06/2023 0621   BILITOT 0.8 12/07/2022 1535   BILITOT 0.6 04/13/2011 1125   GFRNONAA >60 02/06/2023 0621   GFRNONAA 67 08/01/2018 1544   GFRAA 78 08/01/2018 1544   Lab Results  Component Value Date   CHOL 105 12/30/2022   HDL 44 12/30/2022   LDLCALC 33 12/30/2022   LDLDIRECT 65 03/02/2020   TRIG 142 12/30/2022   TRIG 142 12/30/2022   CHOLHDL 2.4 12/30/2022   Lab Results  Component Value Date   HGBA1C 6.4 (H) 12/30/2022   No results found for: "VITAMINB12" Lab Results  Component Value Date   TSH 0.686 08/09/2022       ASSESSMENT AND PLAN  89 y.o. year old female here with:   Dx:  1. Chronic ischemic right MCA stroke       PLAN:  RIGHT MCA stroke 11/28-12/11- Acute Ischemic Infarct:  right MCA territory infarct with R M2 and A1 occlusion s/p IR with TICI 3 revascularization, etiology: Cardioembolic in the setting of A-fib  Code Stroke  CT head  -hyperdense right MCA, cytotoxic edema in right stratum and insula.  ASPECTS 7   CTA head & neck acute clot at the right terminal ICA and acute obstructing right MCA M2 and ACA A1. CT perfusion 6/149 in the right MCA territory.  S/p IR via right ICA direct access with TICI3 MRI scattered acute right MCA distribution infarcts involving the right caudate and lentiform nuclei, right insula, and overlying right cerebral hemisphere, small volume petechial hemorrhage at the posterior right frontal region [I reviewed images myself and agree with interpretation. -VRP] MRA  with interval revascularization of previously seen clot at the right ICA terminus and right M2 segment, Focal moderate distal right P2 stenosis. 2D Echo EF 60 to 65%.  LV severe concentric hypertrophy.  LA severely dilated LDL 33 HgbA1c 6.4 Now off eliquis due to subarachnoid hemorrhage (hemorrhagic transformation) Continue BP control and statin Consider palliative care consult  Cerebral aneurysms / infundibula CTA- 4 mm outpouchings extending posteriorly and inferiorly from both supraclinoid ICAs, which could reflect small aneurysms versus vascular infundibula. Appearance is similar  as compared to recent MRA from 12/30/2022. Moderate atheromatous plaque about the carotid bifurcations and carotid siphons without hemodynamically significant stenosis. Atheromatous plaque at the origin of the right vertebral artery with moderate to severe stenosis.  Return for return to PCP.  I reviewed images, labs, notes, records myself. I summarized findings and reviewed with patient, for this high risk  condition (stroke, dementia, palliative discussion) requiring high complexity decision making.    Suanne Marker, MD 03/21/2023, 12:38 PM Certified in Neurology, Neurophysiology and Neuroimaging  [facility] [address], [address] [place], [name] [address] [phone]

## 2023-03-22 NOTE — Progress Notes (Unsigned)
Patient ID: Jocelyn [name], female   DOB: December 03, 1931, 89 y.o.   MRN: [id number]  Reason for Consult: No chief complaint on file.   Referred by No ref. provider found  Subjective:     HPI  Jocelyn [name] is a 89 y.o. female presenting for follow-up of PAD with CL TI with a nonhealing left heel wound.  She resides in a rehab facility and is nonambulatory.  At her first visit 2 weeks ago I explained to the granddaughter and the patient that she is not a candidate for revascularization given her nonambulatory status and explained that even with perfect perfusion heel wounds are very unlikely to heal once the recent the dry gangrenous stage.  ***  Past Medical History:  Diagnosis Date   Arthritis of both knees 08/01/2015   Colitis    Essential hypertension, benign 07/28/2014   GERD (gastroesophageal reflux disease)    Hx of iron deficiency anemia 09/02/2015   Hyperlipidemia    Hypertension    Lumbar pain    Medicare annual wellness visit, subsequent 04/07/2017   OSA on CPAP 08/01/2015   Overweight (BMI 25.0-29.9) 07/10/2013   Prediabetes 09/02/2015   SOB (shortness of breath) 07/10/2013   Stroke (HCC)    Family History  Problem Relation Age of Onset   Diabetes Mother    Heart disease Mother    Cancer Father        lung; smoker   Hypertension Daughter    Diabetes Daughter    Cancer Daughter        breast   Anuerysm Son    Breast cancer Neg Hx    Past Surgical History:  Procedure Laterality Date   ABDOMINAL HYSTERECTOMY     CATARACT EXTRACTION     CHOLECYSTECTOMY     COLONOSCOPY WITH PROPOFOL N/A 04/09/2018   Procedure: COLONOSCOPY WITH PROPOFOL;  Surgeon: Toney Reil, MD;  Location: [facility] ENDOSCOPY;  Service: Gastroenterology;  Laterality: N/A;   ESOPHAGOGASTRODUODENOSCOPY N/A 01/09/2023   Procedure: ESOPHAGOGASTRODUODENOSCOPY (EGD);  Surgeon: Diamantina Monks, MD;  Location: [facility] ENDOSCOPY;  Service: General;  Laterality: N/A;   IR CT HEAD LTD  12/29/2022   IR  PERCUTANEOUS ART THROMBECTOMY/INFUSION INTRACRANIAL INC DIAG ANGIO  12/29/2022   IR REPLC GASTRO/COLONIC TUBE PERCUT W/FLUORO  01/16/2023   IR US GUIDE VASC ACCESS RIGHT  12/29/2022   IR US GUIDE VASC ACCESS RIGHT  12/29/2022   IRRIGATION AND DEBRIDEMENT ABDOMEN N/A 01/27/2023   Procedure: IRRIGATION AND DEBRIDEMENT ABDOMEN wall abcess;  Surgeon: Lysle Rubens, MD;  Location: [facility] OR;  Service: General;  Laterality: N/A;   LUMBAR DISC SURGERY     PEG PLACEMENT N/A 01/09/2023   Procedure: PERCUTANEOUS ENDOSCOPIC GASTROSTOMY (PEG) PLACEMENT;  Surgeon: Diamantina Monks, MD;  Location: [facility] ENDOSCOPY;  Service: General;  Laterality: N/A;   RADIOLOGY WITH ANESTHESIA N/A 12/29/2022   Procedure: IR WITH ANESTHESIA;  Surgeon: Radiologist, Medication, MD;  Location: [facility] OR;  Service: Radiology;  Laterality: N/A;    Short Social History:  Social History   Tobacco Use   Smoking status: Never   Smokeless tobacco: Never  Substance Use Topics   Alcohol use: No    Alcohol/week: 0.0 standard drinks of alcohol    No Known Allergies  Current Outpatient Medications  Medication Sig Dispense Refill   acetaminophen (TYLENOL) 325 MG tablet Take 2 tablets (650 mg total) by mouth every 4 (four) hours as needed for mild pain (pain score 1-3) (or temp > 37.5 C (99.5 F)). (  Patient taking differently: Place 650 mg into feeding tube every 4 (four) hours as needed for mild pain (pain score 1-3) (or temp > 37.5 C (99.5 F)).) 30 tablet 0   amLODipine (NORVASC) 10 MG tablet Place 1 tablet (10 mg total) into feeding tube daily. 30 tablet 0   ascorbic acid (VITAMIN C) 250 MG tablet Place 1 tablet (250 mg total) into feeding tube daily.     atorvastatin (LIPITOR) 40 MG tablet Place 1 tablet (40 mg total) into feeding tube daily. 30 tablet 0   calcium citrate-vitamin D 500-400 MG-UNIT chewable tablet Place 1 tablet into feeding tube 2 (two) times daily.     famotidine (PEPCID) 20 MG tablet Place 1 tablet (20 mg total) into  feeding tube daily. 30 tablet 0   ferrous sulfate 300 (60 Fe) MG/5ML syrup Place 325 mg into feeding tube daily.     fiber supplement, BANATROL TF, liquid Place 60 mLs into feeding tube 2 (two) times daily.     HYDROcodone-acetaminophen (NORCO/VICODIN) 5-325 MG tablet Take 1 tablet by mouth every 6 (six) hours as needed for moderate pain (pain score 4-6). 10 tablet 0   ipratropium-albuterol (DUONEB) 0.5-2.5 (3) MG/3ML SOLN Take 3 mLs by nebulization every 6 (six) hours as needed. 360 mL 0   lidocaine (LIDODERM) 5 % Place 2 patches onto the skin daily. Remove & Discard patch within 12 hours or as directed by MD 14 patch 0   liver oil-zinc oxide (DESITIN) 40 % ointment Apply topically 3 (three) times daily. Apply to perianal region, buttock, groin after each stool 56.7 g 0   Mouthwashes (MOUTH RINSE) LIQD solution 15 mLs by Mouth Rinse route as needed (oral care).     Multiple Vitamin (MULTIVITAMIN) LIQD Place 15 mLs into feeding tube daily.     Nutritional Supplements (FEEDING SUPPLEMENT, OSMOLITE 1.2 CAL,) LIQD Place 1,000 mLs into feeding tube continuous. 1000 mL 0   Water For Irrigation, Sterile (FREE WATER) SOLN Place 100 mLs into feeding tube every 4 (four) hours. 100 mL 0   white petrolatum (VASELINE) OINT Apply 1 Application topically as needed for lip care. 1 packet 0   No current facility-administered medications for this visit.    REVIEW OF SYSTEMS  Positive for ***  All other systems were reviewed and are negative     Objective:  Objective   There were no vitals filed for this visit. There is no height or weight on file to calculate BMI.  Physical Exam General: no acute distress Cardiac: hemodynamically stable Pulm: normal work of breathing Extremities: Full-thickness left heel wound with areas of dry eschar and necrosis.     Data: ABI +---------+------------------+-----+----------+----------------+  Left    Lt Pressure (mmHg)IndexWaveform  Comment            +---------+------------------+-----+----------+----------------+  PTA                            monophasicnon-compressible  +---------+------------------+-----+----------+----------------+  DP                             monophasicnon-compressible  +---------+------------------+-----+----------+----------------+  Great Toe                       Absent                      +---------+------------------+-----+----------+----------------+   +-------+------------+------------+------------+------------+  ABI/TBIToday's ABI  Today's TBI Previous ABIPrevious TBI  +-------+------------+------------+------------+------------+  Right not obtainednot obtained                          +-------+------------+------------+------------+------------+  Left  Springer          absent                                +-------+------------+------------+------------+------------+    Echo reviewed EF 60 to 65%, RSVP 40, left atrium severely dilated, right atrium mildly dilated.   CMP reviewed, creatinine 0.61     Assessment/Plan:     NANCYJO GIVHAN is a 89 y.o. female with history of HTN, HLD, OSA presenting for f/u of a nonhealing wound.   I had a frank discussion with the patient and her *** and explained that heel ulcerations are extremely difficult and  also explained that even if we were able to obtain perfect blood flow that the left heel wound is still very unlikely to heal at its current state. I explained that given her non-ambulatory status and current condition she is not a candidate for revascularization and explained that a revascularization attempt would likely do more harm than good as I do not believe her heel ulcer is likely to improve.  I again discussed the 2 options of above-knee amputation versus continued wound care and comfort.  I explained that as it is not infected or causing her significant constant pain that this is not urgent and they can continue with  wound care and comfort as a long-term plan. Follow-up as needed  They will call for follow-up should the decide to proceed with above-the-knee amputation.   Recommendations to optimize cardiovascular risk: Abstinence from all tobacco products. Blood glucose control with goal A1c < 7%. Blood pressure control with goal blood pressure < 140/90 mmHg. Lipid reduction therapy with goal LDL-C <100 mg/dL  Aspirin 81mg  PO QD.  Atorvastatin 40-80mg  PO QD (or other "high intensity" statin therapy).     Daria Pastures MD [facility]

## 2023-03-23 ENCOUNTER — Encounter: Payer: 59 | Admitting: Orthopedic Surgery

## 2023-03-24 ENCOUNTER — Encounter: Payer: Self-pay | Admitting: Vascular Surgery

## 2023-03-24 ENCOUNTER — Ambulatory Visit: Payer: 59 | Admitting: Vascular Surgery

## 2023-03-24 VITALS — BP 122/64 | HR 85 | Temp 97.8°F | Resp 16

## 2023-03-24 DIAGNOSIS — I70222 Atherosclerosis of native arteries of extremities with rest pain, left leg: Secondary | ICD-10-CM

## 2023-03-30 ENCOUNTER — Encounter: Payer: 59 | Admitting: Orthopedic Surgery

## 2023-04-06 ENCOUNTER — Ambulatory Visit: Payer: 59 | Admitting: Orthopedic Surgery

## 2023-04-06 ENCOUNTER — Encounter: Payer: Self-pay | Admitting: Orthopedic Surgery

## 2023-04-06 DIAGNOSIS — L97323 Non-pressure chronic ulcer of left ankle with necrosis of muscle: Secondary | ICD-10-CM

## 2023-04-06 DIAGNOSIS — I739 Peripheral vascular disease, unspecified: Secondary | ICD-10-CM | POA: Diagnosis not present

## 2023-04-06 NOTE — Progress Notes (Signed)
 Office Visit Note   Patient: Jocelyn [name]           Date of Birth: 01-Sep-1931           MRN: [id number] Visit Date: 04/06/2023              Requested by: No referring provider defined for this encounter. PCP: Gorden Harms, PA-C (Inactive)  Chief Complaint  Patient presents with   Other    Left heel wound      HPI: Patient is an 89 year old woman who was seen for evaluation for ischemic ulcers left heel and left Achilles.  Patient has recently followed up with vascular surgery.  She was told she had some circulation.  She is not a revascularization candidate.  Assessment & Plan: Visit Diagnoses:  1. PAD (peripheral artery disease) (HCC)   2. 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  Discussed that with her pain increased we could proceed with a below-knee amputation.  At this point patient will continue with twice a week home health nursing dressing changes.  Follow-Up Instructions: No follow-ups on file.   Ortho Exam  Patient is alert, oriented, no adenopathy, well-dressed, normal affect, normal respiratory effort. Examination patient has an ischemic necrotic ulcer with necrotic Achilles posteriorly.  Ulcer 2 cm in diameter.  Patient also has a necrotic plantar heel ulcer with black eschar that is 5 cm in diameter.  There is superficial eschar that was removed from around the wound edges.  There is no purulent drainage.  Patient does not have a palpable pulse.  Patient does have pain with light touch.  Current pain relieved with Vicodin.  Imaging: No results found. No images are attached to the encounter.  Labs: Lab Results  Component Value Date   HGBA1C 6.4 (H) 12/30/2022   HGBA1C 6.1 (H) 12/08/2017   HGBA1C 6.3 (H) 02/03/2017   REPTSTATUS 02/01/2023 FINAL 01/27/2023   GRAMSTAIN  01/27/2023    ABUNDANT WBC PRESENT, PREDOMINANTLY  PMN RARE GRAM POSITIVE COCCI    CULT  01/27/2023    MODERATE STREPTOCOCCUS ANGINOSIS FEW ENTEROBACTER CLOACAE NO ANAEROBES ISOLATED Performed at [facility], [address] [place], [name] [address]    LABORGA ENTEROBACTER CLOACAE 01/27/2023   [facility] STREPTOCOCCUS ANGINOSIS 01/27/2023     Lab Results  Component Value Date   ALBUMIN 2.3 (L) 02/06/2023   ALBUMIN 2.3 (L) 02/05/2023   ALBUMIN 2.3 (L) 02/04/2023    Lab Results  Component Value Date   MG 1.9 01/24/2023   MG 1.8 01/22/2023   MG 2.2 01/18/2023   No results found for: "VD25OH"  No results found for: "PREALBUMIN"    Latest Ref Rng & Units 02/06/2023    6:21 AM 02/05/2023    6:08 AM 02/04/2023    6:11 AM  CBC EXTENDED  WBC 4.0 - 10.5 K/uL 9.7  9.2  8.4   RBC 3.87 - 5.11 MIL/uL 3.54  3.56  3.70   Hemoglobin 12.0 - 15.0 g/dL 9.4  9.5  9.8   HCT 46.9 - 46.0 % 29.6  29.8  30.7   Platelets 150 - 400 K/uL 319  336  303      There is no height or weight on file to calculate BMI.  Orders:  No orders of the defined types were placed in this encounter.  No orders of the defined types  were placed in this encounter.    Procedures: No procedures performed  Clinical Data: No additional findings.  ROS:  All other systems negative, except as noted in the HPI. Review of Systems  Objective: Vital Signs: There were no vitals taken for this visit.  Specialty Comments:  No specialty comments available.  PMFS History: Patient Active Problem List   Diagnosis Date Noted   Heel ulcer, left, with fat layer exposed (HCC) 03/14/2023   Abdominal wall abscess 01/31/2023   Bacteremia due to Enterobacter species 01/31/2023   Aspiration pneumonia (HCC) 01/31/2023   Dysphagia 01/31/2023   Oral candidiasis 01/31/2023   Transaminitis 01/31/2023   Chronic a-fib (HCC) 01/31/2023   Pressure injury of skin 01/31/2023   Malnutrition of moderate degree 01/16/2023   ICH (intracerebral hemorrhage) (HCC) 01/14/2023    Acute ischemic stroke (HCC) 12/29/2022   Coronary artery disease of native artery of native heart with stable angina pectoris (HCC) 04/21/2022   Coronary artery calcification 10/27/2021   Aortic atherosclerosis (HCC) 10/27/2021   Lumbar facet arthropathy 03/22/2018   SI joint arthritis (HCC) 03/22/2018   Chronic pain of both knees 03/22/2018   Chronic pain syndrome 03/22/2018   Right sided colitis 12/30/2017   Impaired fasting glucose 02/06/2017   Carpal tunnel syndrome on right 09/05/2015   Gastroesophageal reflux disease without esophagitis 09/05/2015   Hx of iron deficiency anemia 09/02/2015   OSA on CPAP 08/01/2015   Bilateral primary osteoarthritis of knee 08/01/2015   Controlled substance agreement signed 07/08/2015   Essential hypertension 07/28/2014   Lumbar degenerative disc disease 07/28/2014   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   Stable angina (HCC) 07/10/2013   Hyperlipidemia 07/10/2013   Cerebrovascular accident (CVA) (HCC) 07/10/2013   Dyspnea on exertion 07/10/2013   Hearing loss 01/29/2009   Arthritis sicca 05/23/2006   Past Medical History:  Diagnosis Date   Arthritis of both knees 08/01/2015   Colitis    Essential hypertension, benign 07/28/2014   GERD (gastroesophageal reflux disease)    Hx of iron deficiency anemia 09/02/2015   Hyperlipidemia    Hypertension    Lumbar pain    Medicare annual wellness visit, subsequent 04/07/2017   OSA on CPAP 08/01/2015   Overweight (BMI 25.0-29.9) 07/10/2013   Prediabetes 09/02/2015   SOB (shortness of breath) 07/10/2013   Stroke (HCC)     Family History  Problem Relation Age of Onset   Diabetes Mother    Heart disease Mother    Cancer Father        lung; smoker   Hypertension Daughter    Diabetes Daughter    Cancer Daughter        breast   Anuerysm Son    Breast cancer Neg Hx     Past Surgical History:  Procedure Laterality Date   ABDOMINAL HYSTERECTOMY     CATARACT EXTRACTION     CHOLECYSTECTOMY      COLONOSCOPY WITH PROPOFOL N/A 04/09/2018   Procedure: COLONOSCOPY WITH PROPOFOL;  Surgeon: Toney Reil, MD;  Location: [facility] ENDOSCOPY;  Service: Gastroenterology;  Laterality: N/A;   ESOPHAGOGASTRODUODENOSCOPY N/A 01/09/2023   Procedure: ESOPHAGOGASTRODUODENOSCOPY (EGD);  Surgeon: Diamantina Monks, MD;  Location: [facility] ENDOSCOPY;  Service: General;  Laterality: N/A;   IR CT HEAD LTD  12/29/2022   IR PERCUTANEOUS ART THROMBECTOMY/INFUSION INTRACRANIAL INC DIAG ANGIO  12/29/2022   IR REPLC GASTRO/COLONIC TUBE PERCUT W/FLUORO  01/16/2023   IR US GUIDE VASC ACCESS RIGHT  12/29/2022   IR US GUIDE VASC ACCESS RIGHT  12/29/2022  IRRIGATION AND DEBRIDEMENT ABDOMEN N/A 01/27/2023   Procedure: IRRIGATION AND DEBRIDEMENT ABDOMEN wall abcess;  Surgeon: Lysle Rubens, MD;  Location: [facility] OR;  Service: General;  Laterality: N/A;   LUMBAR DISC SURGERY     PEG PLACEMENT N/A 01/09/2023   Procedure: PERCUTANEOUS ENDOSCOPIC GASTROSTOMY (PEG) PLACEMENT;  Surgeon: Diamantina Monks, MD;  Location: [facility] ENDOSCOPY;  Service: General;  Laterality: N/A;   RADIOLOGY WITH ANESTHESIA N/A 12/29/2022   Procedure: IR WITH ANESTHESIA;  Surgeon: Radiologist, Medication, MD;  Location: [facility] OR;  Service: Radiology;  Laterality: N/A;   Social History   Occupational History   Not on file  Tobacco Use   Smoking status: Never   Smokeless tobacco: Never  Vaping Use   Vaping status: Never Used  Substance and Sexual Activity   Alcohol use: No    Alcohol/week: 0.0 standard drinks of alcohol   Drug use: No   Sexual activity: Not Currently

## 2023-04-17 ENCOUNTER — Telehealth: Payer: Self-pay

## 2023-04-17 NOTE — Patient Outreach (Signed)
 Telephone outreach to patient's daughter to obtain mRS was successfully completed. MRS= 5  Vanice Sarah [facility] Management Assistant  Direct Dial: [phone]  Fax: [phone] Website: [email or web address]

## 2023-04-19 ENCOUNTER — Encounter: Payer: Self-pay | Admitting: Cardiology

## 2023-05-04 ENCOUNTER — Ambulatory Visit: Admitting: Orthopedic Surgery

## 2023-05-08 ENCOUNTER — Other Ambulatory Visit: Payer: Self-pay

## 2023-05-08 ENCOUNTER — Emergency Department

## 2023-05-08 ENCOUNTER — Inpatient Hospital Stay
Admission: EM | Admit: 2023-05-08 | Discharge: 2023-05-14 | DRG: 593 | Disposition: A | Discharge status: Hospice | Attending: Internal Medicine | Admitting: Internal Medicine

## 2023-05-08 DIAGNOSIS — Z6824 Body mass index (BMI) 24.0-24.9, adult: Secondary | ICD-10-CM

## 2023-05-08 DIAGNOSIS — E878 Other disorders of electrolyte and fluid balance, not elsewhere classified: Secondary | ICD-10-CM

## 2023-05-08 DIAGNOSIS — T502X5A Adverse effect of carbonic-anhydrase inhibitors, benzothiadiazides and other diuretics, initial encounter: Secondary | ICD-10-CM | POA: Diagnosis present

## 2023-05-08 DIAGNOSIS — Z931 Gastrostomy status: Secondary | ICD-10-CM

## 2023-05-08 DIAGNOSIS — E785 Hyperlipidemia, unspecified: Secondary | ICD-10-CM | POA: Diagnosis present

## 2023-05-08 DIAGNOSIS — E876 Hypokalemia: Secondary | ICD-10-CM | POA: Diagnosis not present

## 2023-05-08 DIAGNOSIS — I739 Peripheral vascular disease, unspecified: Secondary | ICD-10-CM | POA: Diagnosis present

## 2023-05-08 DIAGNOSIS — F0390 Unspecified dementia without behavioral disturbance: Secondary | ICD-10-CM | POA: Diagnosis present

## 2023-05-08 DIAGNOSIS — E663 Overweight: Secondary | ICD-10-CM | POA: Diagnosis present

## 2023-05-08 DIAGNOSIS — Z8249 Family history of ischemic heart disease and other diseases of the circulatory system: Secondary | ICD-10-CM

## 2023-05-08 DIAGNOSIS — Z66 Do not resuscitate: Secondary | ICD-10-CM | POA: Diagnosis present

## 2023-05-08 DIAGNOSIS — L97429 Non-pressure chronic ulcer of left heel and midfoot with unspecified severity: Secondary | ICD-10-CM | POA: Diagnosis not present

## 2023-05-08 DIAGNOSIS — R7303 Prediabetes: Secondary | ICD-10-CM | POA: Diagnosis present

## 2023-05-08 DIAGNOSIS — L97409 Non-pressure chronic ulcer of unspecified heel and midfoot with unspecified severity: Secondary | ICD-10-CM | POA: Diagnosis present

## 2023-05-08 DIAGNOSIS — I10 Essential (primary) hypertension: Secondary | ICD-10-CM | POA: Diagnosis present

## 2023-05-08 DIAGNOSIS — L97423 Non-pressure chronic ulcer of left heel and midfoot with necrosis of muscle: Secondary | ICD-10-CM

## 2023-05-08 DIAGNOSIS — G4733 Obstructive sleep apnea (adult) (pediatric): Secondary | ICD-10-CM | POA: Diagnosis present

## 2023-05-08 DIAGNOSIS — I482 Chronic atrial fibrillation, unspecified: Secondary | ICD-10-CM | POA: Diagnosis not present

## 2023-05-08 DIAGNOSIS — K219 Gastro-esophageal reflux disease without esophagitis: Secondary | ICD-10-CM | POA: Diagnosis present

## 2023-05-08 DIAGNOSIS — F05 Delirium due to known physiological condition: Secondary | ICD-10-CM | POA: Diagnosis not present

## 2023-05-08 DIAGNOSIS — Z803 Family history of malignant neoplasm of breast: Secondary | ICD-10-CM

## 2023-05-08 DIAGNOSIS — Z79899 Other long term (current) drug therapy: Secondary | ICD-10-CM

## 2023-05-08 DIAGNOSIS — Z515 Encounter for palliative care: Secondary | ICD-10-CM

## 2023-05-08 DIAGNOSIS — Z833 Family history of diabetes mellitus: Secondary | ICD-10-CM

## 2023-05-08 DIAGNOSIS — I110 Hypertensive heart disease with heart failure: Secondary | ICD-10-CM | POA: Diagnosis present

## 2023-05-08 DIAGNOSIS — I69391 Dysphagia following cerebral infarction: Secondary | ICD-10-CM

## 2023-05-08 DIAGNOSIS — Z801 Family history of malignant neoplasm of trachea, bronchus and lung: Secondary | ICD-10-CM

## 2023-05-08 DIAGNOSIS — I5032 Chronic diastolic (congestive) heart failure: Secondary | ICD-10-CM | POA: Diagnosis present

## 2023-05-08 DIAGNOSIS — Z5986 Financial insecurity: Secondary | ICD-10-CM

## 2023-05-08 DIAGNOSIS — L97229 Non-pressure chronic ulcer of left calf with unspecified severity: Secondary | ICD-10-CM | POA: Diagnosis present

## 2023-05-08 DIAGNOSIS — L089 Local infection of the skin and subcutaneous tissue, unspecified: Secondary | ICD-10-CM | POA: Diagnosis present

## 2023-05-08 HISTORY — DX: Unspecified dementia, unspecified severity, without behavioral disturbance, psychotic disturbance, mood disturbance, and anxiety: F03.90

## 2023-05-08 LAB — CBC WITH DIFFERENTIAL/PLATELET
Abs Immature Granulocytes: 0.01 10*3/uL (ref 0.00–0.07)
Basophils Absolute: 0 10*3/uL (ref 0.0–0.1)
Basophils Relative: 1 %
Eosinophils Absolute: 0.2 10*3/uL (ref 0.0–0.5)
Eosinophils Relative: 4 %
HCT: 38.1 % (ref 36.0–46.0)
Hemoglobin: 12.2 g/dL (ref 12.0–15.0)
Immature Granulocytes: 0 %
Lymphocytes Relative: 43 %
Lymphs Abs: 2.7 10*3/uL (ref 0.7–4.0)
MCH: 24.7 pg — ABNORMAL LOW (ref 26.0–34.0)
MCHC: 32 g/dL (ref 30.0–36.0)
MCV: 77.1 fL — ABNORMAL LOW (ref 80.0–100.0)
Monocytes Absolute: 0.5 10*3/uL (ref 0.1–1.0)
Monocytes Relative: 7 %
Neutro Abs: 2.8 10*3/uL (ref 1.7–7.7)
Neutrophils Relative %: 45 %
Platelets: 234 10*3/uL (ref 150–400)
RBC: 4.94 MIL/uL (ref 3.87–5.11)
RDW: 19 % — ABNORMAL HIGH (ref 11.5–15.5)
WBC: 6.3 10*3/uL (ref 4.0–10.5)
nRBC: 0 % (ref 0.0–0.2)

## 2023-05-08 LAB — COMPREHENSIVE METABOLIC PANEL WITH GFR
ALT: 11 U/L (ref 0–44)
AST: 18 U/L (ref 15–41)
Albumin: 3.4 g/dL — ABNORMAL LOW (ref 3.5–5.0)
Alkaline Phosphatase: 52 U/L (ref 38–126)
Anion gap: 8 (ref 5–15)
BUN: 10 mg/dL (ref 8–23)
CO2: 28 mmol/L (ref 22–32)
Calcium: 9.4 mg/dL (ref 8.9–10.3)
Chloride: 103 mmol/L (ref 98–111)
Creatinine, Ser: 0.58 mg/dL (ref 0.44–1.00)
GFR, Estimated: >60 mL/min (ref >60)
Glucose, Bld: 109 mg/dL — ABNORMAL HIGH (ref 70–99)
Potassium: 2.6 mmol/L — CL (ref 3.5–5.1)
Sodium: 139 mmol/L (ref 135–145)
Total Bilirubin: 0.8 mg/dL (ref 0.0–1.2)
Total Protein: 8.1 g/dL (ref 6.5–8.1)

## 2023-05-08 LAB — LACTIC ACID, PLASMA
Lactic Acid, Venous: 1.3 mmol/L (ref 0.5–1.9)
Lactic Acid, Venous: 1.5 mmol/L (ref 0.5–1.9)

## 2023-05-08 LAB — MAGNESIUM: Magnesium: 1.6 mg/dL — ABNORMAL LOW (ref 1.7–2.4)

## 2023-05-08 MED ORDER — OLMESARTAN-AMLODIPINE-HCTZ 40-10-12.5 MG PO TABS: 1.0000 | ORAL_TABLET | Freq: Every day | ORAL | Status: DC

## 2023-05-08 MED ORDER — METRONIDAZOLE 500 MG/100ML IV SOLN
500.0000 mg | Freq: Two times a day (BID) | INTRAVENOUS | Status: DC
  Administered 2023-05-08 – 2023-05-11 (×6): 500 mg via INTRAVENOUS
  Filled 2023-05-08 (×7): qty 100

## 2023-05-08 MED ORDER — ATORVASTATIN CALCIUM 20 MG PO TABS
40.0000 mg | ORAL_TABLET | Freq: Every day | ORAL | Status: DC
  Administered 2023-05-08 – 2023-05-13 (×6): 40 mg via ORAL
  Filled 2023-05-08 (×6): qty 2

## 2023-05-08 MED ORDER — HYDROMORPHONE HCL 1 MG/ML IJ SOLN: 0.5000 mg | INTRAMUSCULAR | Status: DC | PRN

## 2023-05-08 MED ORDER — FAMOTIDINE 20 MG PO TABS
20.0000 mg | ORAL_TABLET | Freq: Every day | ORAL | Status: DC
  Administered 2023-05-09 – 2023-05-14 (×6): 20 mg via ORAL
  Filled 2023-05-08 (×6): qty 1

## 2023-05-08 MED ORDER — POTASSIUM CHLORIDE CRYS ER 20 MEQ PO TBCR
40.0000 meq | EXTENDED_RELEASE_TABLET | ORAL | Status: AC
  Administered 2023-05-08 – 2023-05-09 (×2): 40 meq via ORAL
  Filled 2023-05-08 (×2): qty 2

## 2023-05-08 MED ORDER — ONDANSETRON HCL 4 MG PO TABS
4.0000 mg | ORAL_TABLET | Freq: Four times a day (QID) | ORAL | Status: DC | PRN
Start: 2023-05-08 — End: 2023-05-14

## 2023-05-08 MED ORDER — ONDANSETRON HCL 4 MG/2ML IJ SOLN
4.0000 mg | Freq: Once | INTRAMUSCULAR | Status: AC
  Administered 2023-05-08: 4 mg via INTRAVENOUS
  Filled 2023-05-08: qty 2

## 2023-05-08 MED ORDER — HYDROCHLOROTHIAZIDE 12.5 MG PO TABS
12.5000 mg | ORAL_TABLET | Freq: Every day | ORAL | Status: DC
  Administered 2023-05-09 – 2023-05-14 (×6): 12.5 mg via ORAL
  Filled 2023-05-08 (×6): qty 1

## 2023-05-08 MED ORDER — ISOSORBIDE MONONITRATE ER 30 MG PO TB24
120.0000 mg | ORAL_TABLET | Freq: Every day | ORAL | Status: DC
  Administered 2023-05-09 – 2023-05-14 (×6): 120 mg via ORAL
  Filled 2023-05-08 (×6): qty 4

## 2023-05-08 MED ORDER — ACETAMINOPHEN 325 MG PO TABS
650.0000 mg | ORAL_TABLET | Freq: Four times a day (QID) | ORAL | Status: DC | PRN
  Administered 2023-05-10 – 2023-05-14 (×6): 650 mg via ORAL
  Filled 2023-05-08 (×6): qty 2

## 2023-05-08 MED ORDER — FERROUS SULFATE 220 (44 FE) MG/5ML PO SOLN
325.0000 mg | Freq: Every day | ORAL | Status: DC
  Administered 2023-05-09 – 2023-05-14 (×6): 325 mg via ORAL
  Filled 2023-05-08 (×2): qty 7.39
  Filled 2023-05-08: qty 10
  Filled 2023-05-08 (×4): qty 7.39

## 2023-05-08 MED ORDER — SODIUM CHLORIDE 0.9 % IV SOLN
2.0000 g | INTRAVENOUS | Status: DC
  Administered 2023-05-09 – 2023-05-11 (×3): 2 g via INTRAVENOUS
  Filled 2023-05-08 (×3): qty 20

## 2023-05-08 MED ORDER — VITAMIN C 500 MG PO TABS
250.0000 mg | ORAL_TABLET | Freq: Every day | ORAL | Status: DC
  Administered 2023-05-09 – 2023-05-14 (×6): 250 mg via ORAL
  Filled 2023-05-08 (×5): qty 1
  Filled 2023-05-08: qty 0.5

## 2023-05-08 MED ORDER — ACETAMINOPHEN 650 MG RE SUPP: 650.0000 mg | Freq: Four times a day (QID) | RECTAL | Status: DC | PRN

## 2023-05-08 MED ORDER — MAGNESIUM SULFATE 2 GM/50ML IV SOLN
2.0000 g | Freq: Once | INTRAVENOUS | Status: AC
  Administered 2023-05-08: 2 g via INTRAVENOUS
  Filled 2023-05-08: qty 50

## 2023-05-08 MED ORDER — IPRATROPIUM-ALBUTEROL 0.5-2.5 (3) MG/3ML IN SOLN: 3.0000 mL | Freq: Four times a day (QID) | RESPIRATORY_TRACT | Status: DC | PRN

## 2023-05-08 MED ORDER — AMLODIPINE BESYLATE 10 MG PO TABS
10.0000 mg | ORAL_TABLET | Freq: Every day | ORAL | Status: DC
  Administered 2023-05-09 – 2023-05-14 (×6): 10 mg via ORAL
  Filled 2023-05-08 (×6): qty 1

## 2023-05-08 MED ORDER — SODIUM CHLORIDE 0.9% FLUSH
3.0000 mL | Freq: Two times a day (BID) | INTRAVENOUS | Status: DC
  Administered 2023-05-08 – 2023-05-14 (×12): 3 mL via INTRAVENOUS

## 2023-05-08 MED ORDER — POTASSIUM CHLORIDE 10 MEQ/100ML IV SOLN
10.0000 meq | INTRAVENOUS | Status: AC
  Administered 2023-05-08 (×2): 10 meq via INTRAVENOUS
  Filled 2023-05-08 (×2): qty 100

## 2023-05-08 MED ORDER — SPIRONOLACTONE 12.5 MG HALF TABLET
12.5000 mg | ORAL_TABLET | Freq: Every day | ORAL | Status: DC
  Administered 2023-05-09 – 2023-05-14 (×6): 12.5 mg via ORAL
  Filled 2023-05-08 (×9): qty 1

## 2023-05-08 MED ORDER — ONDANSETRON HCL 4 MG/2ML IJ SOLN: 4.0000 mg | Freq: Four times a day (QID) | INTRAMUSCULAR | Status: DC | PRN

## 2023-05-08 MED ORDER — SODIUM CHLORIDE 0.9 % IV SOLN
2.0000 g | Freq: Once | INTRAVENOUS | Status: AC
  Administered 2023-05-08: 2 g via INTRAVENOUS
  Filled 2023-05-08: qty 20

## 2023-05-08 MED ORDER — IRBESARTAN 150 MG PO TABS
300.0000 mg | ORAL_TABLET | Freq: Every day | ORAL | Status: DC
Start: 2023-05-09 — End: 2023-05-14
  Administered 2023-05-09 – 2023-05-14 (×6): 300 mg via ORAL
  Filled 2023-05-08 (×7): qty 2

## 2023-05-08 MED ORDER — VANCOMYCIN HCL IN DEXTROSE 1-5 GM/200ML-% IV SOLN
1000.0000 mg | Freq: Once | INTRAVENOUS | Status: AC
  Administered 2023-05-08: 1000 mg via INTRAVENOUS
  Filled 2023-05-08: qty 200

## 2023-05-08 MED ORDER — FENTANYL CITRATE PF 50 MCG/ML IJ SOSY
50.0000 ug | PREFILLED_SYRINGE | Freq: Once | INTRAMUSCULAR | Status: AC
  Administered 2023-05-08: 50 ug via INTRAVENOUS
  Filled 2023-05-08: qty 1

## 2023-05-08 MED ORDER — FUROSEMIDE 40 MG PO TABS
40.0000 mg | ORAL_TABLET | Freq: Every day | ORAL | Status: DC
  Administered 2023-05-09 – 2023-05-14 (×6): 40 mg via ORAL
  Filled 2023-05-08 (×6): qty 1

## 2023-05-08 MED ORDER — OXYCODONE HCL 5 MG PO TABS
5.0000 mg | ORAL_TABLET | Freq: Four times a day (QID) | ORAL | Status: DC | PRN
  Administered 2023-05-08 – 2023-05-14 (×13): 5 mg via ORAL
  Filled 2023-05-08 (×13): qty 1

## 2023-05-08 MED ORDER — POLYETHYLENE GLYCOL 3350 17 G PO PACK: 17.0000 g | PACK | Freq: Every day | ORAL | Status: DC | PRN

## 2023-05-08 NOTE — H&P (Signed)
 History and Physical    Patient: Jocelyn [name] ZOX:[id number] DOB: 02-19-1931 DOA: 05/08/2023 DOS: the patient was seen and examined on 05/08/2023 PCP: Dudley Major, FNP  Patient coming from: Home  Chief Complaint:  Chief Complaint  Patient presents with   Weakness   HPI: Jocelyn [name] is a 89 y.o. 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  History provided by patient's daughter at bedside.  She states that Mrs. Maves has a wound care home health nurse that comes by the house.  Last week, when the nurse came by she was worried that the PEG tube had a surrounding infection given increased purulence.  Patient was also complaining of abdominal pain that day.  She has not complained of abdominal pain since.  Then today, the wound care nurse noted that the heel ulcer seems to have significantly increased discharge.  Due to this, EMS was called.  No noted fevers, chills, shortness of breath, chest pain, nausea, vomiting.  Patient is no longer using the PEG tube and it has not been flushed in over a month.  They are in the process of trying to have it removed.  ED course: On arrival to the ED, patient was normotensive at 135/75 with heart rate of 95.  She was saturating at 100% on room air.  She was afebrile 98.7.  Initial workup notable for normal WBC of 6.3, potassium 2.2, magnesium 1.6.  Lactic acid negative x 2.  Chest x-ray with no acute cardiopulmonary disease.  Left foot x-ray pending.  Patient started on Rocephin and vancomycin.  TRH contacted for admission.  Review of Systems: As mentioned in the history of present illness.  All other systems reviewed and are negative.  Past Medical History:  Diagnosis Date   Arthritis of both knees 08/01/2015   Colitis    Dementia (HCC)    Essential hypertension, benign 07/28/2014   GERD (gastroesophageal reflux disease)    Hx of iron deficiency anemia 09/02/2015   Hyperlipidemia    Hypertension    Lumbar pain    Medicare annual wellness visit, subsequent 04/07/2017   OSA on CPAP 08/01/2015   Overweight (BMI 25.0-29.9) 07/10/2013   Prediabetes 09/02/2015   SOB (shortness of breath) 07/10/2013   Stroke [facility])    Past Surgical History:  Procedure Laterality Date   ABDOMINAL HYSTERECTOMY     CATARACT EXTRACTION     CHOLECYSTECTOMY     COLONOSCOPY WITH PROPOFOL N/A 04/09/2018   Procedure: COLONOSCOPY WITH PROPOFOL;  Surgeon: Toney Reil, MD;  Location: [facility] ENDOSCOPY;  Service: Gastroenterology;  Laterality: N/A;   ESOPHAGOGASTRODUODENOSCOPY N/A 01/09/2023   Procedure: ESOPHAGOGASTRODUODENOSCOPY (EGD);  Surgeon: Diamantina Monks, MD;  Location: [facility] ENDOSCOPY;  Service: General;  Laterality: N/A;   IR CT HEAD LTD  12/29/2022   IR PERCUTANEOUS ART THROMBECTOMY/INFUSION INTRACRANIAL INC DIAG ANGIO  12/29/2022   IR REPLC GASTRO/COLONIC TUBE PERCUT W/FLUORO  01/16/2023   IR US GUIDE VASC ACCESS RIGHT  12/29/2022   IR US GUIDE VASC ACCESS RIGHT  12/29/2022   IRRIGATION AND DEBRIDEMENT ABDOMEN N/A 01/27/2023   Procedure: IRRIGATION AND DEBRIDEMENT ABDOMEN wall abcess;  Surgeon: Lysle Rubens, MD;  Location: [facility] OR;  Service: General;  Laterality: N/A;   LUMBAR  DISC SURGERY     PEG PLACEMENT N/A 01/09/2023   Procedure: PERCUTANEOUS ENDOSCOPIC GASTROSTOMY (PEG) PLACEMENT;  Surgeon: Diamantina Monks, MD;  Location: [facility] ENDOSCOPY;  Service: General;  Laterality: N/A;   RADIOLOGY WITH ANESTHESIA N/A 12/29/2022   Procedure: IR WITH ANESTHESIA;  Surgeon: Radiologist, Medication, MD;  Location: [facility] OR;  Service: Radiology;  Laterality: N/A;   Social History:  reports that she  has never smoked. She has never used smokeless tobacco. She reports that she does not drink alcohol and does not use drugs.  No Known Allergies  Family History  Problem Relation Age of Onset   Diabetes Mother    Heart disease Mother    Cancer Father        lung; smoker   Hypertension Daughter    Diabetes Daughter    Cancer Daughter        breast   Anuerysm Son    Breast cancer Neg Hx     Prior to Admission medications   Medication Sig Start Date End Date Taking? Authorizing Provider  acetaminophen (TYLENOL) 325 MG tablet Take 2 tablets (650 mg total) by mouth every 4 (four) hours as needed for mild pain (pain score 1-3) (or temp > 37.5 C (99.5 F)). Patient taking differently: Place 650 mg into feeding tube every 4 (four) hours as needed for mild pain (pain score 1-3) (or temp > 37.5 C (99.5 F)). 01/11/23   de Saintclair Halsted, Cortney E, NP  amLODipine (NORVASC) 10 MG tablet Place 1 tablet (10 mg total) into feeding tube daily. 01/12/23   de Saintclair Halsted, Cortney E, NP  ascorbic acid (VITAMIN C) 250 MG tablet Place 1 tablet (250 mg total) into feeding tube daily. 02/03/23   Rodolph Bong, MD  atorvastatin (LIPITOR) 40 MG tablet Place 1 tablet (40 mg total) into feeding tube daily. 01/12/23   de Saintclair Halsted, Cortney E, NP  calcium citrate-vitamin D 500-400 MG-UNIT chewable tablet Place 1 tablet into feeding tube 2 (two) times daily.    [provider]  famotidine (PEPCID) 20 MG tablet Place 1 tablet (20 mg total) into feeding tube daily. 01/12/23   de Saintclair Halsted, Cortney E, NP  ferrous sulfate 300 (60 Fe) MG/5ML syrup Place 325 mg into feeding tube daily.    [provider]  fiber supplement, BANATROL TF, liquid Place 60 mLs into feeding tube 2 (two) times daily. 02/02/23   Rodolph Bong, MD  HYDROcodone-acetaminophen (NORCO/VICODIN) 5-325 MG tablet Take 1 tablet by mouth every 6 (six) hours as needed for moderate pain (pain score 4-6). 02/07/23   Dorcas Carrow, MD   ipratropium-albuterol (DUONEB) 0.5-2.5 (3) MG/3ML SOLN Take 3 mLs by nebulization every 6 (six) hours as needed. 01/11/23   de Saintclair Halsted, Cortney E, NP  lidocaine (LIDODERM) 5 % Place 2 patches onto the skin daily. Remove & Discard patch within 12 hours or as directed by MD 02/02/23   Rodolph Bong, MD  liver oil-zinc oxide (DESITIN) 40 % ointment Apply topically 3 (three) times daily. Apply to perianal region, buttock, groin after each stool 02/02/23   Rodolph Bong, MD  Mouthwashes (MOUTH RINSE) LIQD solution 15 mLs by Mouth Rinse route as needed (oral care). 02/02/23   Rodolph Bong, MD  Multiple Vitamin (MULTIVITAMIN) LIQD Place 15 mLs into feeding tube daily. 02/03/23   Rodolph Bong, MD  Nutritional Supplements (FEEDING SUPPLEMENT, OSMOLITE 1.2 CAL,) LIQD Place 1,000 mLs into feeding tube continuous. 01/11/23   de  Saintclair Halsted, Cortney E, NP  Water For Irrigation, Sterile (FREE WATER) SOLN Place 100 mLs into feeding tube every 4 (four) hours. 01/11/23   de Saintclair Halsted, Cortney E, NP  white petrolatum (VASELINE) OINT Apply 1 Application topically as needed for lip care. 01/11/23   de Verdis Prime, NP    Physical Exam: Vitals:   05/08/23 1700 05/08/23 1730 05/08/23 1800 05/08/23 1830  BP: (!) 157/74 (!) 119/92 (!) 142/72 (!) 152/70  Pulse: 95 94 85 76  Resp: (!) 21 15  (!) 21  Temp:      TempSrc:      SpO2: 98% 93% 94% 95%   Physical Exam Vitals and nursing note reviewed.  Constitutional:      General: She is not in acute distress.    Appearance: She is normal weight. She is not toxic-appearing.  HENT:     Head: Normocephalic and atraumatic.     Mouth/Throat:     Mouth: Mucous membranes are moist.     Pharynx: Oropharynx is clear.  Eyes:     Conjunctiva/sclera: Conjunctivae normal.     Pupils: Pupils are equal, round, and reactive to light.  Cardiovascular:     Rate and Rhythm: Normal rate. Rhythm irregular.     Heart sounds: No murmur heard. Pulmonary:      Effort: Pulmonary effort is normal. No respiratory distress.     Breath sounds: Normal breath sounds. No wheezing, rhonchi or rales.  Abdominal:     General: Bowel sounds are normal. There is no distension.     Palpations: Abdomen is soft.     Tenderness: There is no abdominal tenderness. There is no guarding.     Comments: PEG tube present in left upper quadrant with minimal drainage.  No surrounding erythema.  Musculoskeletal:     Right lower leg: No edema.     Left lower leg: No edema.  Feet:     Left foot:     Skin integrity: Ulcer (Large ulcer noted on the heel with evidence of eschar on the medial portion and on the lateral portion, thick purulent discharge.  Ulcer also noted overlying the Achilles tendon) present.  Skin:    General: Skin is warm and dry.  Neurological:     Mental Status: She is alert.     Comments: Patient is alert and oriented to person, family members, place, but not quite oriented to situation and what brought her in today  Psychiatric:        Mood and Affect: Mood normal.        Behavior: Behavior normal.        Data Reviewed: CBC with WBC of 6.3, hemoglobin of 12.2, MCV of 77, platelets of 234 CMP with sodium of 139, potassium 2.6, bicarb 28, creatinine 0.58, BUN 10, AST 18, ALT 11, GFR above 60 Lactic acid 1.3 then 1.5 Magnesium 1.6  EKG personally reviewed.  Atrial fibrillation with a rate of 88.  Singular PVC noted.  No acute ischemic changes  DG Chest 2 View Result Date: 05/08/2023 CLINICAL DATA:  Weakness EXAM: CHEST - 2 VIEW COMPARISON:  X-ray 01/25/2023 and older FINDINGS: Enlarged cardiopericardial silhouette. Calcified aorta. No consolidation, pneumothorax or effusion. Mild left lung base scar or atelectasis. Calcifications along the mitral valve annulus. Degenerative changes seen along the spine. IMPRESSION: Enlarged cardiac silhouette. Calcified tortuous aorta. Left basilar scar or atelectasis. Curvature and degenerative changes of the spine.  Electronically Signed   By: Piedad Climes.D.  On: 05/08/2023 16:50   Results are pending, will review when available.  Assessment and Plan:  * Heel ulcer (HCC) Patient is presenting with an infected heel ulcer, with ulcer that has been present since hospitalization in January 2025.  Increased pain and purulent drainage began over the last 2-3 days.  She has previously seen both vascular surgery and orthopedic surgery regarding the wound; both have recommended amputation versus wound care for comfort measures.  Given patient's advanced age and comorbidities, her family have opted to move forward with nonoperative management only.  No evidence of sepsis at this time.  - Podiatry and wound care consulted; appreciate their recommendations - Left foot x-ray pending - Telemetry monitoring for 24 hours given at risk for sepsis - Continue Rocephin and vancomycin - Add on Flagyl - Pain control with Tylenol, oxycodone and Dilaudid - Patient has established with outpatient wound care center, appointment coming up in May.  Electrolyte abnormality Likely due to diuretic use.  No EKG changes noted.  - Electrolyte management per pharmacy  S/P percutaneous endoscopic gastrostomy (PEG) tube placement [facility]) Patient's family states she is no longer using the PEG tube and would like to look into having it removed.  Per [facility] OV note on 05/02/23, they are waiting for PCP and neurology approval of removal.  Minimal drainage noted on exam, but no evidence of acute infection.  Chronic diastolic CHF (congestive heart failure) (HCC) Per chart review, patient has a history of HFpEF last EF of 60-65% in November 2024 with severe concentric LVH.  She established with advanced heart failure clinic at which time suspected to be due to hypertensive cardiomyopathy and TTR cardiac amyloid.  She appears euvolemic at this time.  - Continue home regimen - Daily weights  Dementia without behavioral disturbance  (HCC) Per chart review, patient family reported worsening cognitive function after multiple intracranial insults including CVA November 2024 and subarachnoid hemorrhage in December 2024.  She appears at baseline at this time.  - Delirium precautions  PAD (peripheral artery disease) (HCC) Patient has a history of PAD and recently evaluated by vascular surgery in February 2025, at which time ABIs demonstrated adequate blood flow.  No evidence of acute ischemia on examination  - Continue home statin  Chronic a-fib [facility]) Patient has a history of atrial fibrillation, previously on Eliquis but subsequently developed spontaneous subarachnoid hemorrhage.  She has been off Eliquis since December 2024.  Rates are controlled at this time.  OSA on CPAP - CPAP at bedtime  Essential hypertension - Continue home regimen  Advance Care Planning:   Code Status: Do not attempt resuscitation (DNR) PRE-ARREST INTERVENTIONS DESIRED verified by patient and daughter at bedside  Consults: Podiatry  Family Communication: Patient's daughter and granddaughter updated at bedside  Severity of Illness: The appropriate patient status for this patient is OBSERVATION. Observation status is judged to be reasonable and necessary in order to provide the required intensity of service to ensure the patient's safety. The patient's presenting symptoms, physical exam findings, and initial radiographic and laboratory data in the context of their medical condition is felt to place them at decreased risk for further clinical deterioration. Furthermore, it is anticipated that the patient will be medically stable for discharge from the hospital within 2 midnights of admission.   Author: Verdene Lennert, MD 05/08/2023 6:45 PM  For on call review [email or web address].

## 2023-05-08 NOTE — Assessment & Plan Note (Addendum)
 Patient's family states she is no longer using the PEG tube and would like to look into having it removed.  Per [facility] OV note on 05/02/23, they are waiting for PCP and neurology approval of removal.  Minimal drainage noted on exam, but no evidence of acute infection.

## 2023-05-08 NOTE — ED Triage Notes (Signed)
 Arrived by [facility] from home c/o G-tube issues. Reports possibly infected. Also area on left heel with infection. Son reports dark urine and foul odor.  A&O X4, family reports dementia  Left sided deficits from old stroke  EMS vitals: 91HR 99% RA 115/86b/p 98.8oral

## 2023-05-08 NOTE — Assessment & Plan Note (Signed)
 -  Continue home regimen

## 2023-05-08 NOTE — ED Notes (Signed)
 This RN gave report to [facility] RN and performed care handoff. Call light in reach, bed wheels locked, side rail raised, pt updated on plan of care. Rounding completed. Fall precautions in place.

## 2023-05-08 NOTE — Assessment & Plan Note (Addendum)
 Likely due to diuretic use.  No EKG changes noted.  - Electrolyte management per pharmacy

## 2023-05-08 NOTE — Assessment & Plan Note (Signed)
 Patient has a history of PAD and recently evaluated by vascular surgery in February 2025, at which time ABIs demonstrated adequate blood flow.  No evidence of acute ischemia on examination  - Continue home statin

## 2023-05-08 NOTE — Consult Note (Signed)
 CODE SEPSIS - PHARMACY COMMUNICATION  **Broad Spectrum Antibiotics should be administered within 1 hour of Sepsis diagnosis**  Time Code Sepsis Called/Page Received: 1707  Antibiotics Ordered: ceftriaxone and Vancomycin  Time of 1st antibiotic administration: 1726  Additional action taken by pharmacy: none  If necessary, Name of Provider/Nurse Contacted: n/a   Mazzie Brodrick Rodriguez-Guzman PharmD, BCPS 05/08/2023 5:25 PM

## 2023-05-08 NOTE — ED Triage Notes (Signed)
 See first nurse note.

## 2023-05-08 NOTE — Consult Note (Signed)
 PHARMACY CONSULT NOTE - ELECTROLYTES  Pharmacy Consult for Electrolyte Monitoring and Replacement   Recent Labs:   CrCl cannot be calculated (Unknown ideal weight.). Potassium (mmol/L)  Date Value  05/08/2023 2.6 (LL)  04/13/2011 3.4 (L)   Magnesium (mg/dL)  Date Value  16/11/9602 1.6 (L)   Calcium (mg/dL)  Date Value  54/10/8117 9.4   Calcium, Total (mg/dL)  Date Value  14/78/2956 9.2   Albumin (g/dL)  Date Value  21/30/8657 3.4 (L)  12/07/2022 4.4  04/13/2011 4.0   Phosphorus (mg/dL)  Date Value  84/69/6295 3.5   Sodium (mmol/L)  Date Value  05/08/2023 139  12/07/2022 143  04/13/2011 141    Assessment  Jocelyn [name] is a 89 y.o. female presenting with   wound infection. 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 Pharmacy has been consulted to monitor and replace electrolytes.  Diet: regula diet MIVF: IV lock Pertinent medications: n/a  Goal of Therapy: Electrolytes WNL  Plan:  Mg replaced by MD with MgSulfate 2gm IV x 1 dose. K replaced by MD with Kcl IV x 2 doses. Will add Kcl po q4hrs x 2 doses Check BMP, Mg, Phos with AM labs  Jocelyn [name] PharmD, BCPS 05/08/2023 7:40 PM

## 2023-05-08 NOTE — Assessment & Plan Note (Addendum)
 Patient is presenting with an infected heel ulcer, with ulcer that has been present since hospitalization in January 2025.  Increased pain and purulent drainage began over the last 2-3 days.  She has previously seen both vascular surgery and orthopedic surgery regarding the wound; both have recommended amputation versus wound care for comfort measures.  Given patient's advanced age and comorbidities, her family have opted to move forward with nonoperative management only.  No evidence of sepsis at this time.  - Podiatry and wound care consulted; appreciate their recommendations - Left foot x-ray pending - Telemetry monitoring for 24 hours given at risk for sepsis - Continue Rocephin and vancomycin - Add on Flagyl - Pain control with Tylenol, oxycodone and Dilaudid - Patient has established with outpatient wound care center, appointment coming up in May.

## 2023-05-08 NOTE — Assessment & Plan Note (Signed)
-   CPAP at bedtime

## 2023-05-08 NOTE — Assessment & Plan Note (Signed)
 Per chart review, patient family reported worsening cognitive function after multiple intracranial insults including CVA November 2024 and subarachnoid hemorrhage in December 2024.  She appears at baseline at this time.  - Delirium precautions

## 2023-05-08 NOTE — ED Provider Triage Note (Addendum)
 Emergency Medicine Provider Triage Evaluation Note  Jocelyn [name] , a 89 y.o. female  was evaluated in triage.  Pt complains of weakness.  Brought in by EMS.  Family called to have patient brought to the ED as they are afraid there is a infection in her G-tube.  Also urine has foul odor.  Patient has deficits from an old stroke on the left side..  Review of Systems  Positive:  Negative:   Physical Exam  There were no vitals taken for this visit. Gen:   Awake, no distress   Resp:  Normal effort  MSK:   Moves extremities without difficulty  Other:    Medical Decision Making  Medically screening exam initiated at 2:35 PM.  Appropriate orders placed.  Jocelyn [name] was informed that the remainder of the evaluation will be completed by another provider, this initial triage assessment does not replace that evaluation, and the importance of remaining in the ED until their evaluation is complete.     Faythe Ghee, PA-C 05/08/23 1436 Family of bedside at this time.  Family states home health nurse was concerned about infection at the left Achilles area along with infection around the G-tube.  Patient does not use the G-tube anymore.  However they were told that it needs to stay in for a while.    Faythe Ghee, PA-C 05/08/23 1451

## 2023-05-08 NOTE — Sepsis Progress Note (Signed)
 Sepsis protocol monitored by eLink ?

## 2023-05-08 NOTE — ED Provider Notes (Signed)
 [facility] Provider Note    Event Date/Time   First MD Initiated Contact with Patient 05/08/23 1637     (approximate)   History   Weakness   HPI  Jocelyn [name] is a 89 y.o. female who has a G-tube who comes in with concerns for UTI, G-tube infection.  Patient has had a history of a prior stroke with deficits in the left side.  Family is concerned about some increasing weakness.  They are concerned the patient has an infection of her left foot.  She reports always having a chronic wound there but now there is a lot of purulent drainage on it that they noticed a few days ago.  She is also get some purulent drainage around her G-tube.  They state they do not even use the G-tube and they do not know why it still in.  They also report some foul-smelling urine concerning for potential UTI.  They deny any known falls. No chest pain no SOB.      Physical Exam   Triage Vital Signs: ED Triage Vitals  Encounter Vitals Group     BP 05/08/23 1434 135/75     Systolic BP Percentile --      Diastolic BP Percentile --      Pulse Rate 05/08/23 1434 95     Resp 05/08/23 1434 18     Temp 05/08/23 1434 98.7 F (37.1 C)     Temp Source 05/08/23 1434 Oral     SpO2 05/08/23 1434 100 %     Weight --      Height --      Head Circumference --      Peak Flow --      Pain Score 05/08/23 1435 0     Pain Loc --      Pain Education --      Exclude from Growth Chart --     Most recent vital signs: Vitals:   05/08/23 1434  BP: 135/75  Pulse: 95  Resp: 18  Temp: 98.7 F (37.1 C)  SpO2: 100%     General: Awake, no distress.  CV:  Good peripheral perfusion.  Resp:  Normal effort.  Abd:  No distention.  G-tube with some discharge noted around it. Other:  Warm will perfused with chronic wound noted on the left heel now with purulent drainage noted Left-sided weakness but able to move all extremities slightly.  ED Results / Procedures / Treatments   Labs (all  labs ordered are listed, but only abnormal results are displayed) Labs Reviewed  COMPREHENSIVE METABOLIC PANEL WITH GFR - Abnormal; Notable for the following components:      Result Value   Potassium 2.6 (*)    Glucose, Bld 109 (*)    Albumin 3.4 (*)    All other components within normal limits  CBC WITH DIFFERENTIAL/PLATELET - Abnormal; Notable for the following components:   MCV 77.1 (*)    MCH 24.7 (*)    RDW 19.0 (*)    All other components within normal limits  LACTIC ACID, PLASMA  LACTIC ACID, PLASMA  URINALYSIS, W/ REFLEX TO CULTURE (INFECTION SUSPECTED)     EKG  My interpretation of EKG:  Atrial fibrillation with a rate of 88 without any ST elevation or T wave inversions, PVC  RADIOLOGY I have reviewed the xray personally and interpreted patient has an enlarged cardiac silhouette.  I reviewed her patient's prior x-rays and she has had cardiac enlargement calcifications  noted before   PROCEDURES:  Critical Care performed: No  .1-3 Lead EKG Interpretation  Performed by: Concha Se, MD Authorized by: Concha Se, MD     Interpretation: abnormal     ECG rate:  80   ECG rate assessment: normal     Rhythm: atrial fibrillation     Ectopy: none     Conduction: normal      MEDICATIONS ORDERED IN ED: Medications  potassium chloride 10 mEq in 100 mL IVPB (10 mEq Intravenous New Bag/Given 05/08/23 1657)  magnesium sulfate IVPB 2 g 50 mL (has no administration in time range)  cefTRIAXone (ROCEPHIN) 2 g in sodium chloride 0.9 % 100 mL IVPB (has no administration in time range)  vancomycin (VANCOCIN) IVPB 1000 mg/200 mL premix (has no administration in time range)     IMPRESSION / MDM / ASSESSMENT AND PLAN / ED COURSE  I reviewed the triage vital signs and the nursing notes.   Patient's presentation is most consistent with acute presentation with potential threat to life or bodily function.   Patient comes in with weakness.  Labs ordered evaluate for Electra  embolus, AKI.  Patient does have some purulent drainage noted on the left heel as well as around the G-tube site.  Will start broad-spectrum antibiotics get a urine evaluate for UTI.  Does not meet sepsis criteria but blood cultures were ordered.  CBC shows normal white count.  Lactate is normal.  CMP shows low potassium of 2.6 will give some IV repletion and check magnesium  Given electrolyte abnormalities in the setting of weakness in the setting of infections we will discuss hospital team for admission  The patient is on the cardiac monitor to evaluate for evidence of arrhythmia and/or significant heart rate changes.      FINAL CLINICAL IMPRESSION(S) / ED DIAGNOSES   Final diagnoses:  Hypomagnesemia  Hypokalemia  Foot infection     Rx / DC Orders   ED Discharge Orders     None        Note:  This document was prepared using Dragon voice recognition software and may include unintentional dictation errors.   Concha Se, MD 05/08/23 [phone]

## 2023-05-08 NOTE — Assessment & Plan Note (Signed)
 Per chart review, patient has a history of HFpEF last EF of 60-65% in November 2024 with severe concentric LVH.  She established with advanced heart failure clinic at which time suspected to be due to hypertensive cardiomyopathy and TTR cardiac amyloid.  She appears euvolemic at this time.  - Continue home regimen - Daily weights

## 2023-05-08 NOTE — Assessment & Plan Note (Signed)
 Patient has a history of atrial fibrillation, previously on Eliquis but subsequently developed spontaneous subarachnoid hemorrhage.  She has been off Eliquis since December 2024.  Rates are controlled at this time.

## 2023-05-09 DIAGNOSIS — F0390 Unspecified dementia without behavioral disturbance: Secondary | ICD-10-CM | POA: Diagnosis present

## 2023-05-09 DIAGNOSIS — Z931 Gastrostomy status: Secondary | ICD-10-CM | POA: Diagnosis not present

## 2023-05-09 DIAGNOSIS — Z515 Encounter for palliative care: Secondary | ICD-10-CM | POA: Diagnosis not present

## 2023-05-09 DIAGNOSIS — T148XXA Other injury of unspecified body region, initial encounter: Secondary | ICD-10-CM

## 2023-05-09 DIAGNOSIS — I69391 Dysphagia following cerebral infarction: Secondary | ICD-10-CM | POA: Diagnosis not present

## 2023-05-09 DIAGNOSIS — E785 Hyperlipidemia, unspecified: Secondary | ICD-10-CM | POA: Diagnosis present

## 2023-05-09 DIAGNOSIS — L97423 Non-pressure chronic ulcer of left heel and midfoot with necrosis of muscle: Secondary | ICD-10-CM | POA: Diagnosis not present

## 2023-05-09 DIAGNOSIS — L97229 Non-pressure chronic ulcer of left calf with unspecified severity: Secondary | ICD-10-CM | POA: Diagnosis present

## 2023-05-09 DIAGNOSIS — I10 Essential (primary) hypertension: Secondary | ICD-10-CM | POA: Diagnosis not present

## 2023-05-09 DIAGNOSIS — F05 Delirium due to known physiological condition: Secondary | ICD-10-CM | POA: Diagnosis not present

## 2023-05-09 DIAGNOSIS — E876 Hypokalemia: Secondary | ICD-10-CM | POA: Diagnosis present

## 2023-05-09 DIAGNOSIS — E663 Overweight: Secondary | ICD-10-CM | POA: Diagnosis present

## 2023-05-09 DIAGNOSIS — Z79899 Other long term (current) drug therapy: Secondary | ICD-10-CM | POA: Diagnosis not present

## 2023-05-09 DIAGNOSIS — L89620 Pressure ulcer of left heel, unstageable: Secondary | ICD-10-CM | POA: Diagnosis not present

## 2023-05-09 DIAGNOSIS — E878 Other disorders of electrolyte and fluid balance, not elsewhere classified: Secondary | ICD-10-CM | POA: Diagnosis not present

## 2023-05-09 DIAGNOSIS — I482 Chronic atrial fibrillation, unspecified: Secondary | ICD-10-CM | POA: Diagnosis present

## 2023-05-09 DIAGNOSIS — I739 Peripheral vascular disease, unspecified: Secondary | ICD-10-CM | POA: Diagnosis present

## 2023-05-09 DIAGNOSIS — R7303 Prediabetes: Secondary | ICD-10-CM | POA: Diagnosis present

## 2023-05-09 DIAGNOSIS — L089 Local infection of the skin and subcutaneous tissue, unspecified: Secondary | ICD-10-CM | POA: Diagnosis present

## 2023-05-09 DIAGNOSIS — K219 Gastro-esophageal reflux disease without esophagitis: Secondary | ICD-10-CM | POA: Diagnosis present

## 2023-05-09 DIAGNOSIS — G4733 Obstructive sleep apnea (adult) (pediatric): Secondary | ICD-10-CM | POA: Diagnosis present

## 2023-05-09 DIAGNOSIS — Z66 Do not resuscitate: Secondary | ICD-10-CM | POA: Diagnosis present

## 2023-05-09 DIAGNOSIS — Z8249 Family history of ischemic heart disease and other diseases of the circulatory system: Secondary | ICD-10-CM | POA: Diagnosis not present

## 2023-05-09 DIAGNOSIS — Z6824 Body mass index (BMI) 24.0-24.9, adult: Secondary | ICD-10-CM | POA: Diagnosis not present

## 2023-05-09 DIAGNOSIS — L97429 Non-pressure chronic ulcer of left heel and midfoot with unspecified severity: Secondary | ICD-10-CM | POA: Diagnosis present

## 2023-05-09 DIAGNOSIS — I110 Hypertensive heart disease with heart failure: Secondary | ICD-10-CM | POA: Diagnosis present

## 2023-05-09 DIAGNOSIS — Z833 Family history of diabetes mellitus: Secondary | ICD-10-CM | POA: Diagnosis not present

## 2023-05-09 DIAGNOSIS — I5032 Chronic diastolic (congestive) heart failure: Secondary | ICD-10-CM | POA: Diagnosis present

## 2023-05-09 LAB — CBC
HCT: 37.1 % (ref 36.0–46.0)
Hemoglobin: 11.6 g/dL — ABNORMAL LOW (ref 12.0–15.0)
MCH: 24.5 pg — ABNORMAL LOW (ref 26.0–34.0)
MCHC: 31.3 g/dL (ref 30.0–36.0)
MCV: 78.3 fL — ABNORMAL LOW (ref 80.0–100.0)
Platelets: 215 10*3/uL (ref 150–400)
RBC: 4.74 MIL/uL (ref 3.87–5.11)
RDW: 18.8 % — ABNORMAL HIGH (ref 11.5–15.5)
WBC: 6.3 10*3/uL (ref 4.0–10.5)
nRBC: 0 % (ref 0.0–0.2)

## 2023-05-09 LAB — BASIC METABOLIC PANEL WITH GFR
Anion gap: 7 (ref 5–15)
BUN: 7 mg/dL — ABNORMAL LOW (ref 8–23)
CO2: 27 mmol/L (ref 22–32)
Calcium: 9 mg/dL (ref 8.9–10.3)
Chloride: 103 mmol/L (ref 98–111)
Creatinine, Ser: 0.51 mg/dL (ref 0.44–1.00)
GFR, Estimated: >60 mL/min (ref >60)
Glucose, Bld: 105 mg/dL — ABNORMAL HIGH (ref 70–99)
Potassium: 3 mmol/L — ABNORMAL LOW (ref 3.5–5.1)
Sodium: 137 mmol/L (ref 135–145)

## 2023-05-09 LAB — MAGNESIUM: Magnesium: 1.9 mg/dL (ref 1.7–2.4)

## 2023-05-09 LAB — PHOSPHORUS: Phosphorus: 3.4 mg/dL (ref 2.5–4.6)

## 2023-05-09 MED ORDER — HEPARIN SODIUM (PORCINE) 5000 UNIT/ML IJ SOLN
5000.0000 [IU] | Freq: Two times a day (BID) | INTRAMUSCULAR | Status: DC
  Administered 2023-05-09 – 2023-05-14 (×11): 5000 [IU] via SUBCUTANEOUS
  Filled 2023-05-09 (×11): qty 1

## 2023-05-09 MED ORDER — POTASSIUM CHLORIDE CRYS ER 20 MEQ PO TBCR
40.0000 meq | EXTENDED_RELEASE_TABLET | ORAL | Status: AC
  Administered 2023-05-09 (×2): 40 meq via ORAL
  Filled 2023-05-09 (×2): qty 2

## 2023-05-09 MED ORDER — VANCOMYCIN HCL 1750 MG/350ML IV SOLN
1750.0000 mg | INTRAVENOUS | Status: DC
Start: 2023-05-10 — End: 2023-05-11
  Administered 2023-05-10: 1750 mg via INTRAVENOUS
  Filled 2023-05-09: qty 350

## 2023-05-09 MED ORDER — VANCOMYCIN HCL 500 MG/100ML IV SOLN
500.0000 mg | Freq: Once | INTRAVENOUS | Status: AC
  Administered 2023-05-09: 500 mg via INTRAVENOUS
  Filled 2023-05-09: qty 100

## 2023-05-09 NOTE — Consult Note (Addendum)
 WOC Nurse Consult Note: patient with history of ischemic ulcers to L heel and L achilles, has been evaluated by vascular surgery and Dr. Lajoyce Corners ortho with recommendations for amputation by both; last note vascular 03/2023 prescribed Betadine and Kerlix dressing changes  Reason for Consult: L heel ulcer  Wound type: full thickness wounds L heel and achilles d/t peripheral vascular disease  Pressure Injury POA: NA  Measurement: see nursing flowsheet  Wound bed: 1.  L heel 100% necrotic yellow black  2.  L achilles 80% pink 20% yellow  Drainage (amount, consistency, odor) purulent per MD note  Periwound: Dressing procedure/placement/frequency: Cleanse L heel and L achilles wounds with Vashe wound cleanser Hart [place] [phone]), apply Xeroform gauze Hart [place] [phone]) to both wound beds daily.  Cover with dry gauze and secure with Kerlix roll gauze  L foot should be placed in Prevalon boot Hart Rochester [phone]) to offload pressure.    POC discussed with bedside nurse. WOC team will not follow. Podiatry has been consulted on this wound as well. Any orders placed by podiatry supercede wound care orders placed by [facility] team.  Re-consult if further needs arise.   Thank you,    Priscella Mann MSN, RN-BC, [employer] [phone]

## 2023-05-09 NOTE — Progress Notes (Signed)
 PROGRESS NOTE    Jocelyn [name]  ZOX:[id number] DOB: [birth year] DOA: 05/08/2023 PCP: Dudley Major, FNP  Chief Complaint  Patient presents with   Weakness    Hospital Course:  Jocelyn [name] is 89 y.o. 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  Per patient's daughter the patient has a wound care nurse at home, who had concern last week that PEG tube had increasing purulence and surrounding infection.  On 4/7 wound care RN noted increased discharge from heel ulcer and called EMS.  Patient has had no fevers, chills, shortness of breath. In the ED patient was found to have normal vitals and unremarkable lab work.  She was started on Rocephin and vancomycin for heel infection.  Subjective: On evaluation this morning patient is endorsing some pain over her heel.  It comes and goes.  Currently she is comfortable. her daughter is at bedside.   Objective: Vitals:   05/09/23 0400 05/09/23 0623 05/09/23 0700 05/09/23 0730  BP: 128/85  (!) 109/53 132/67  Pulse: 91  87 96  Resp: (!) 25  (!) 32 20  Temp:  98 F (36.7 C)    TempSrc:  Oral    SpO2: 90%  92% 94%  Weight:        Intake/Output Summary (Last 24 hours) at 05/09/2023 0831 Last data filed at 05/09/2023 0749 Gross per 24 hour  Intake 150 ml  Output --  Net 150 ml   Filed Weights   05/09/23 0054  Weight: 63.5 kg    Examination: General exam: Appears calm and comfortable, NAD  Respiratory system: No work of breathing, symmetric chest wall expansion Cardiovascular system: S1 & S2 heard, RRR.  Gastrointestinal system: Abdomen is nondistended, soft and nontender.,  PEG tube with some  serous drainage.  No erythema surrounding PEG tube site. Neuro: Alert. Extremities: Left heel with large ulcer, black eschar on medial and lateral portion, purulent discharge and serosanguineous discharge.  Also has ulcer over Achilles, small area granulation tissue.  See admission photos. Psychiatry: Demonstrates appropriate judgement and insight. Mood & affect appropriate for situation.   Assessment & Plan:  Principal Problem:   Heel ulcer (HCC) Active Problems:   Electrolyte abnormality   S/P percutaneous endoscopic gastrostomy (PEG) tube placement (HCC)   Essential hypertension   OSA on CPAP   Chronic a-fib (HCC)   PAD (peripheral artery disease) (HCC)   Dementia without behavioral disturbance (HCC)   Chronic diastolic CHF (congestive heart failure) (HCC)    Heel ulcer - Chronic heel ulcer for which patient has been following with orthopedic surgery and vascular surgery.  Both specialists have recommended amputation or hospice.  - Podiatry consulted again on this admission and agrees with recommendations for amputation or hospice. - Patient is currently on broad-spectrum antibiotics -Extensive discussion with her daughter at bedside today.  At this time family would like to continue with antibiotics and wound care for now.  Discussed that ulcer is unlikely to heal.  We also discussed the possibility of osteomyelitis development.  Family is concerned with the change in lifestyle if she loses her lower extremity.  Currently patient requires Michiel Sites lift for all transfers.  Does not ambulate, does not pivot. --Wound care RN consult. - Continue with  as needed pain control - Continue telemetry monitoring given high risk for sepsis.  No evidence of sepsis at this time - Blood cultures taken, follow. - Plain film left: soft tissue defect no osseous abnormalities  Electrolyte abnormalities Hypokalemia - Hold diuretic therapy - Continue to replace electrolytes as needed  Status post PEG  tube placement - Patient reports she is no longer using PEG and they have been seeking removal outpatient - Patient has not used PEG in 2 months.  Tolerates p.o. without issue - PEG site appears noninfected.  Some scant drainage noted - Have discussed with IR, they will remove at bedside.  Chronic diastolic heart failure - Last echo November 2024 with severe concentric LVH.  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  ABIs demonstrated adequate blood flow as of February 2025 - No evidence of acute ischemia on exam - Continue home dose statin - Heel ulcer as above  Chronic A-fib - History of A-fib previously on Eliquis, discontinued given SAH - Continue monitoring for rate control.  Currently stable  OSA on CPAP - Continue CPAP at bedtime  Hypertension - Continue home regimen    prophylaxis: Heparin   Code Status: Do not attempt resuscitation (DNR) PRE-ARREST INTERVENTIONS DESIRED Family Communication:  Discussed directly with patient Disposition:  Inpatient, still hospitalized for IV antibiotics.  Has home health, total care with assistance of family at home.  Will discharge to same. Consultants:    Procedures:    Antimicrobials:  Anti-infectives (From admission, onward)    Start     Dose/Rate Route Frequency Ordered Stop   05/10/23 1800  vancomycin (VANCOREADY) IVPB 1750 mg/350 mL        1,750 mg 175 mL/hr over 120 Minutes Intravenous Every 48 hours 05/09/23 0149     05/09/23 1800  cefTRIAXone (ROCEPHIN) 2 g in sodium chloride 0.9 % 100 mL IVPB        2 g 200 mL/hr over 30 Minutes  Intravenous Every 24 hours 05/08/23 1823     05/09/23 0100  vancomycin (VANCOREADY) IVPB 500 mg/100 mL        500 mg 100 mL/hr over 60 Minutes Intravenous  Once 05/09/23 0055 05/09/23 0254   05/08/23 1830  metroNIDAZOLE (FLAGYL) IVPB 500 mg        500 mg 100 mL/hr over 60 Minutes Intravenous Every 12 hours 05/08/23 1822     05/08/23 1715  cefTRIAXone (ROCEPHIN) 2 g in sodium chloride 0.9 % 100 mL IVPB        2 g 200 mL/hr over 30 Minutes Intravenous Once 05/08/23 1709 05/08/23 1755   05/08/23 1715  vancomycin (VANCOCIN) IVPB 1000 mg/200 mL premix        1,000 mg 200 mL/hr over 60 Minutes Intravenous  Once 05/08/23 1709 05/08/23 2000       Data Reviewed: I have personally reviewed following labs and imaging studies CBC: Recent Labs  Lab 05/08/23 1454 05/09/23 0624  WBC 6.3 6.3  NEUTROABS 2.8  --   HGB 12.2 11.6*  HCT 38.1 37.1  MCV 77.1* 78.3*  PLT 234 215   Basic Metabolic Panel: Recent Labs  Lab  05/08/23 1454 05/09/23 0624  NA 139 137  K 2.6* 3.0*  CL 103 103  CO2 28 27  GLUCOSE 109* 105*  BUN 10 7*  CREATININE 0.58 0.51  CALCIUM 9.4 9.0  MG 1.6* 1.9  PHOS  --  3.4   GFR: Estimated Creatinine Clearance: 42.7 mL/min (by C-G formula based on SCr of 0.51 mg/dL). Liver Function Tests: Recent Labs  Lab 05/08/23 1454  AST 18  ALT 11  ALKPHOS 52  BILITOT 0.8  PROT 8.1  ALBUMIN 3.4*   CBG: No results for input(s): "GLUCAP" in the last 168 hours.  No results found for this or any previous visit (from the past 240 hours).   Radiology Studies: DG Foot 2 Views Left Result Date: 05/08/2023 CLINICAL DATA:  Heel wound. EXAM: LEFT FOOT - 2 VIEW COMPARISON:  None Available. FINDINGS: Osseous structures are osteopenic. No acute fracture, dislocation or subluxation. Soft tissue defect posterior to the calcaneus consistent with an open wound. Posterior and plantar calcaneal spurs. No bony destructive processes. IMPRESSION: Osteopenia. Soft tissue defect. No acute osseous  abnormalities. Electronically Signed   By: Layla Maw M.D.   On: 05/08/2023 19:53   DG Chest 2 View Result Date: 05/08/2023 CLINICAL DATA:  Weakness EXAM: CHEST - 2 VIEW COMPARISON:  X-ray 01/25/2023 and older FINDINGS: Enlarged cardiopericardial silhouette. Calcified aorta. No consolidation, pneumothorax or effusion. Mild left lung base scar or atelectasis. Calcifications along the mitral valve annulus. Degenerative changes seen along the spine. IMPRESSION: Enlarged cardiac silhouette. Calcified tortuous aorta. Left basilar scar or atelectasis. Curvature and degenerative changes of the spine. Electronically Signed   By: Karen Kays M.D.   On: 05/08/2023 16:50    Scheduled Meds:  irbesartan  300 mg Oral Daily   And   amLODipine  10 mg Oral Daily   And   hydrochlorothiazide  12.5 mg Oral Daily   ascorbic acid  250 mg Oral Daily   atorvastatin  40 mg Oral QHS   famotidine  20 mg Oral Daily   ferrous sulfate  325 mg Oral Daily   furosemide  40 mg Oral Daily   isosorbide mononitrate  120 mg Oral Daily   potassium chloride  40 mEq Oral Q4H   sodium chloride flush  3 mL Intravenous Q12H   spironolactone  12.5 mg Oral Daily   Continuous Infusions:  cefTRIAXone (ROCEPHIN)  IV     metronidazole Stopped (05/09/23 0749)   [START ON 05/10/2023] vancomycin       LOS: 0 days  MDM: Patient is high risk for one or more organ failure.  They necessitate ongoing hospitalization for continued IV therapies and subsequent lab monitoring. Total time spent interpreting labs and vitals, coordinating care amongst consultants and care team members, directly assessing and discussing care with the patient and/or family: 55 min    Debarah Crape, DO [facility]  To contact the attending physician between 7A-7P please use Epic Chat. To contact the covering physician during after hours 7P-7A, please review Amion.   05/09/2023, 8:31 AM   *This document has been created with the assistance of  dictation software. Please excuse typographical errors. *

## 2023-05-09 NOTE — Progress Notes (Signed)
 Pharmacy Antibiotic Note  Jocelyn [name] is a 89 y.o. female admitted on 05/08/2023 with  wound infection .  Pharmacy has been consulted for Vancomycin dosing.  Plan: Vancomycin 1 gm IV X 1 given in ED on 4/7 @ 1802.  Additional Vanc 500 mg IV X 1 given on 4/8 @ 0132 to make total loading dose of 1500 mg.  Vancomycin 1750 mg IV Q48H ordered to start on 4/9 @ 1800.  AUC = 515.4 Vanc trough = 8.3  Weight: 63.5 kg (139 lb 15.9 oz)  Temp (24hrs), Avg:98.2 F (36.8 C), Min:97.6 F (36.4 C), Max:98.7 F (37.1 C)  Recent Labs  Lab 05/08/23 1454 05/08/23 1645  WBC 6.3  --   CREATININE 0.58  --   LATICACIDVEN 1.3 1.5    Estimated Creatinine Clearance: 42.7 mL/min (by C-G formula based on SCr of 0.58 mg/dL).    No Known Allergies  Antimicrobials this admission:   >>    >>   Dose adjustments this admission:   Microbiology results:  BCx:   UCx:    Sputum:    MRSA PCR:   Thank you for allowing pharmacy to be a part of this patient's care.  Guhan Bruington D 05/09/2023 1:49 AM

## 2023-05-09 NOTE — Consult Note (Signed)
 PHARMACY CONSULT NOTE - ELECTROLYTES  Pharmacy Consult for Electrolyte Monitoring and Replacement   Recent Labs: Weight: 63.5 kg (139 lb 15.9 oz) Estimated Creatinine Clearance: 42.7 mL/min (by C-G formula based on SCr of 0.51 mg/dL). Potassium (mmol/L)  Date Value  05/09/2023 3.0 (L)  04/13/2011 3.4 (L)   Magnesium (mg/dL)  Date Value  40/98/1191 1.9   Calcium (mg/dL)  Date Value  47/82/9562 9.0   Calcium, Total (mg/dL)  Date Value  13/08/6576 9.2   Albumin (g/dL)  Date Value  46/96/2952 3.4 (L)  12/07/2022 4.4  04/13/2011 4.0   Phosphorus (mg/dL)  Date Value  84/13/2440 3.4   Sodium (mmol/L)  Date Value  05/09/2023 137  12/07/2022 143  04/13/2011 141    Assessment  Jocelyn [name] is a 89 y.o. female presenting with   wound infection. 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 Pharmacy has been consulted to monitor and replace electrolytes.  Diet: regular diet MIVF: IV lock Pertinent medications:  lasix 40mg  po daily, Irbesartan/amlodipine/Hydrochlorothiazide, spironolactone  Goal of Therapy: Electrolytes WNL  Plan:  K 3.0   Will order Kcl po q4hrs x 2 doses Check electrolytes with AM labs  Bari Mantis PharmD Clinical Pharmacist 05/09/2023

## 2023-05-09 NOTE — Plan of Care (Signed)

## 2023-05-09 NOTE — TOC CM/SW Note (Signed)
 CSW asked to complete MOON at 5pm.  However, patient is in inpatient status.  CSW sent an Epic chat to Dr. Margaretmary Dys who confirmed that patient is inpatient.  She states that the information was shared with the admin.  Pt is on IV abx.

## 2023-05-09 NOTE — Consult Note (Signed)
 WOC consulted for heel ulcer, see consultation notes from the morning.   Jettie Lazare [facility], CNS, [employer] [phone]

## 2023-05-09 NOTE — Plan of Care (Signed)
 Consulted for Left heel ulceration with question of infection for question of bedside debridement.  Per notes and consult from Dr. Huel Cote, the family is not interested in surgical intervention. She has been evaluated by Dr. Lajoyce Corners as well as Dr. Hetty Blend. Not a revascularization candidate. Dr. Lajoyce Corners recommended BKA vs palliative wound care.   Given necrotic appearance of the wound in the pictures from admission I would recommend continued local wound care per prior recommendations or per WOCN. Patient is not septic. Typically, avascular ulcerations, especially heel decubitus ulcerations, do not respond positively to bedside debridement and will not provide long term improvement in wound prognosis.    May be beneficial to involve palliative care team to further discuss with the family the implications of choice against proximal limb amputation vs palliative wound care.   Will sign off. Please message with questions        Corinna Gab, DPM [facility] / [facility]                   05/09/2023

## 2023-05-10 DIAGNOSIS — L97423 Non-pressure chronic ulcer of left heel and midfoot with necrosis of muscle: Secondary | ICD-10-CM | POA: Diagnosis not present

## 2023-05-10 LAB — RENAL FUNCTION PANEL
Albumin: 3 g/dL — ABNORMAL LOW (ref 3.5–5.0)
Anion gap: 7 (ref 5–15)
BUN: 8 mg/dL (ref 8–23)
CO2: 26 mmol/L (ref 22–32)
Calcium: 8.9 mg/dL (ref 8.9–10.3)
Chloride: 104 mmol/L (ref 98–111)
Creatinine, Ser: 0.49 mg/dL (ref 0.44–1.00)
GFR, Estimated: >60 mL/min (ref >60)
Glucose, Bld: 103 mg/dL — ABNORMAL HIGH (ref 70–99)
Phosphorus: 3.4 mg/dL (ref 2.5–4.6)
Potassium: 3.4 mmol/L — ABNORMAL LOW (ref 3.5–5.1)
Sodium: 137 mmol/L (ref 135–145)

## 2023-05-10 LAB — MAGNESIUM: Magnesium: 1.8 mg/dL (ref 1.7–2.4)

## 2023-05-10 MED ORDER — MAGNESIUM SULFATE 2 GM/50ML IV SOLN
2.0000 g | Freq: Once | INTRAVENOUS | Status: AC
  Administered 2023-05-10: 2 g via INTRAVENOUS
  Filled 2023-05-10: qty 50

## 2023-05-10 MED ORDER — POTASSIUM CHLORIDE CRYS ER 20 MEQ PO TBCR
40.0000 meq | EXTENDED_RELEASE_TABLET | ORAL | Status: AC
  Administered 2023-05-10 (×2): 40 meq via ORAL
  Filled 2023-05-10 (×2): qty 2

## 2023-05-10 NOTE — Progress Notes (Signed)
 PROGRESS NOTE    Jocelyn [name]  WUJ:[id number] DOB: [birth year] DOA: 05/08/2023 PCP: Dudley Major, FNP  139A/139A-BB  LOS: 1 day   Brief hospital course:   Assessment & Plan: Jocelyn [name] is 89 y.o. 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  Per patient's daughter the patient has a wound care nurse at home, who had concern last week that PEG tube had increasing purulence and surrounding infection.  On 4/7 wound care RN noted increased discharge from heel ulcer and called EMS.    Chronic Heel ulcer - Chronic heel ulcer for which patient has been following with orthopedic surgery and vascular surgery.  Both specialists have recommended amputation or hospice.  - Podiatry consulted again on this admission and agrees with recommendations for amputation, abx are probably futile given poor vascularity the area, abx /wound care is palliative and not curative in nature, however, pt and family currently declined amputation. - started on on broad-spectrum antibiotics --cont vanc ceftriaxone and flagyl --ID consult tomorrow --wound care   Hypokalemia --monitor and supplement PRN  Hypomag --monitor and supplement PRN   Dysphagia 2/2 stroke Status post PEG tube placement - Patient reports she is no longer using PEG since Feb, and they have been seeking removal outpatient - PEG site appears noninfected.  Some scant drainage noted --RN to flush tube today.  If tube functional, daughter now wants to keep it.   Chronic diastolic heart failure - Last echo November 2024 with severe concentric LVH.  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  ABIs demonstrated adequate blood flow as of February 2025 - No evidence of acute ischemia on exam --cont statin   Chronic A-fib - History of A-fib previously on Eliquis, discontinued given SAH   OSA on CPAP - Continue CPAP at bedtime   Hypertension - Continue home regimen   DVT prophylaxis: Heparin SQ Code Status: DNR  Family Communication: daughter updated at bedside today Level of care: Med-Surg Dispo:   The patient is from: home Anticipated d/c is to: home Anticipated d/c date is: 2-3 days   Subjective and Interval History:  Pt reported doing ok.  Family reported pt eating ok.   Objective: Vitals:   05/09/23 2106 05/10/23 0533 05/10/23 0837 05/10/23 1532  BP: (!) 111/53 (!) 133/58 134/70 (!) 104/46  Pulse: 95 93 85 98  Resp: 16 16 16 16   Temp: 98.2 F (36.8 C) 98.4 F (36.9 C) 98.5 F (36.9 C) 99.6 F (37.6 C)  TempSrc: Oral Oral Oral Oral  SpO2: 96% 97% 96% 96%  Weight:        Intake/Output Summary (Last 24 hours) at 05/10/2023 1854 Last data filed at 05/10/2023 1040 Gross per 24 hour  Intake 120 ml  Output --  Net 120 ml   Filed Weights   05/09/23 0054  Weight: 63.5 kg    Examination:   Constitutional: NAD, AAOx3 HEENT: conjunctivae and lids normal, EOMI CV: No  cyanosis.   RESP: normal respiratory effort, on RA Abdomen: PEG tube present, clean and dry   Data Reviewed: I have personally reviewed labs and imaging studies  Time spent: 50 minutes  Darlin Priestly, MD [facility] If 7PM-7AM, please contact night-coverage 05/10/2023, 6:54 PM

## 2023-05-10 NOTE — Plan of Care (Signed)

## 2023-05-10 NOTE — Progress Notes (Addendum)
 Peg tube cleaned around entry site. Peg tube flushes well.  SKin is pink, Dry, and intact.

## 2023-05-10 NOTE — Plan of Care (Signed)

## 2023-05-10 NOTE — Consult Note (Signed)
 PHARMACY CONSULT NOTE - ELECTROLYTES  Pharmacy Consult for Electrolyte Monitoring and Replacement   Recent Labs: Weight: 63.5 kg (139 lb 15.9 oz) Estimated Creatinine Clearance: 42.7 mL/min (by C-G formula based on SCr of 0.49 mg/dL). Potassium (mmol/L)  Date Value  05/10/2023 3.4 (L)  04/13/2011 3.4 (L)   Magnesium (mg/dL)  Date Value  09/81/1914 1.8   Calcium (mg/dL)  Date Value  78/29/5621 8.9   Calcium, Total (mg/dL)  Date Value  30/86/5784 9.2   Albumin (g/dL)  Date Value  69/62/9528 3.0 (L)  12/07/2022 4.4  04/13/2011 4.0   Phosphorus (mg/dL)  Date Value  41/32/4401 3.4   Sodium (mmol/L)  Date Value  05/10/2023 137  12/07/2022 143  04/13/2011 141    Assessment  Jocelyn [name] is a 89 y.o. female presenting with   wound infection. 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 Pharmacy has been consulted to monitor and replace electrolytes.  Diet: regular diet MIVF: IV lock Pertinent medications:  lasix 40mg  po daily, Irbesartan/amlodipine/Hydrochlorothiazide, spironolactone  Goal of Therapy: Electrolytes WNL K>4.0 and Mag>2.0 w/ Afib  Plan:  K 3.4   Will order Kcl po q4hrs x 2 doses again today. On lasix daily Mag 1.8  will order Magnesium sulfate 2 gm IV x1 Check electrolytes with AM labs  Bari Mantis PharmD Clinical Pharmacist 05/10/2023

## 2023-05-11 DIAGNOSIS — L97423 Non-pressure chronic ulcer of left heel and midfoot with necrosis of muscle: Secondary | ICD-10-CM | POA: Diagnosis not present

## 2023-05-11 DIAGNOSIS — I739 Peripheral vascular disease, unspecified: Secondary | ICD-10-CM | POA: Diagnosis not present

## 2023-05-11 DIAGNOSIS — L89620 Pressure ulcer of left heel, unstageable: Secondary | ICD-10-CM | POA: Diagnosis not present

## 2023-05-11 LAB — POTASSIUM: Potassium: 3.7 mmol/L (ref 3.5–5.1)

## 2023-05-11 MED ORDER — TRAZODONE HCL 50 MG PO TABS
25.0000 mg | ORAL_TABLET | Freq: Once | ORAL | Status: AC
  Administered 2023-05-11: 25 mg via ORAL
  Filled 2023-05-11: qty 1

## 2023-05-11 MED ORDER — POTASSIUM CHLORIDE CRYS ER 20 MEQ PO TBCR
40.0000 meq | EXTENDED_RELEASE_TABLET | ORAL | Status: AC
  Administered 2023-05-11 (×2): 40 meq via ORAL
  Filled 2023-05-11 (×2): qty 2

## 2023-05-11 NOTE — Plan of Care (Signed)

## 2023-05-11 NOTE — Consult Note (Signed)
 NAME: Jocelyn [name]  DOB: [birth year]  MRN: [id number]  Date/Time: 05/11/2023 3:01 PM  REQUESTING PROVIDER: Kateri [name] Subjective:  REASON FOR CONSULT: left heel decubitus ulcer ?No history available from patient- chart reviewed  Jocelyn [name] is a 89 y.o. with a history of CVA, AFIB, Aspiration pneumonia s/p peg, HTN, dementia, HTN, SAH, left heel ulcer, PAD  presented to the ED for wounds As per hospitalist note the home health nurse visited her and thought the peg site had some discharge. The next day she felt the wound on the left heel had discharge . Pt did not have any fever [name] chills [name] any other complaints PEG is not in use any more In the ED vitals  05/08/23 14:34  BP 135/75  Temp 98.7 F (37.1 C)  Pulse Rate 95  Resp 18  SpO2 100 %     Latest Reference Range & Units 05/08/23 14:54  WBC 4.0 - 10.5 K/uL 6.3  Hemoglobin 12.0 - 15.0 g/dL 53.6  HCT 64.4 - 03.4 % 38.1  Platelets 150 - 400 K/uL 234  Creatinine 0.44 - 1.00 mg/dL 7.42  Blood culture was sent and Xray of the elft foot did not show any osteomyelitis of calcaneum Was started on vanco/ceftriaxone and flagyl Seen by podiatry  and recommended BKA VS palliative wound care management I am asked to see her  for antibiotic management H/O Prolonged hospitalization in Dec/jan 2024 Had intially acute RT MCA infarct in the setting of Afib, peg was placed and was discharged to SNF, then came to [name] with subarachnoid hemorrhage in rt sylvian fissure and Jocelyn [name] was given to reverse eliquis, and she was transferred to [name] There she had enterobacter bacteremia and abdominal wall abscess around peg site and had I/D on 01/27/23 and that culture was also enterobacter cloacae and strep anginosus- she was seen by ID and was treated with Antibiotics.she had deep tissue pressure injury to the left heel at that time She was sent to SNF [facility] on 02/07/23  On 03/24/23 was assessed by Vascualr surgeon Jocelyn [name] who thought  revascularization was not an option- more harm than good- to consider AKA if ischemic pain worsened She saw Jocelyn [name] ortho as OP on 04/06/23 for the left heel ulcer of 5 cm . An eschar was present that time as well. No palpable pulse- BKA was recommended if pain increased otherwise to continue topical care Past Medical History:  Diagnosis Date   Arthritis of both knees 08/01/2015   Colitis    Dementia (HCC)    Essential hypertension, benign 07/28/2014   GERD (gastroesophageal reflux disease)    Hx of iron deficiency anemia 09/02/2015   Hyperlipidemia    Hypertension    Lumbar pain    Medicare annual wellness visit, subsequent 04/07/2017   OSA on CPAP 08/01/2015   Overweight (BMI 25.0-29.9) 07/10/2013   Prediabetes 09/02/2015   SOB (shortness of breath) 07/10/2013   Stroke (HCC)     Past Surgical History:  Procedure Laterality Date   ABDOMINAL HYSTERECTOMY     CATARACT EXTRACTION     CHOLECYSTECTOMY     COLONOSCOPY WITH PROPOFOL N/A 04/09/2018   Procedure: COLONOSCOPY WITH PROPOFOL;  Surgeon: Jocelyn Reil, [name];  Location: [name];  Service: Gastroenterology;  Laterality: N/A;   ESOPHAGOGASTRODUODENOSCOPY N/A 01/09/2023   Procedure: ESOPHAGOGASTRODUODENOSCOPY (EGD);  Surgeon: Jocelyn Monks, [name];  Location: Joc[facility] [name];  Service: General;  Laterality: N/A;   IR CT HEAD LTD  12/29/2022   IR PERCUTANEOUS ART THROMBECTOMY/INFUSION  INTRACRANIAL INC DIAG ANGIO  12/29/2022   IR REPLC GASTRO/COLONIC TUBE PERCUT W/FLUORO  01/16/2023   IR US GUIDE VASC ACCESS RIGHT  12/29/2022   IR US GUIDE VASC ACCESS RIGHT  12/29/2022   IRRIGATION AND DEBRIDEMENT ABDOMEN N/A 01/27/2023   Procedure: IRRIGATION AND DEBRIDEMENT ABDOMEN wall abcess;  Surgeon: Jocelyn Rubens, [name];  Location: Joc[facility] [name];  Service: General;  Laterality: N/A;   LUMBAR DISC SURGERY     PEG PLACEMENT N/A 01/09/2023   Procedure: PERCUTANEOUS ENDOSCOPIC GASTROSTOMY (PEG) PLACEMENT;  Surgeon: Jocelyn Monks, [name];  Location: Joc[facility] [name];  Service: General;  Laterality: N/A;   RADIOLOGY WITH ANESTHESIA N/A 12/29/2022   Procedure: IR WITH ANESTHESIA;  Surgeon: Jocelyn [name];  Location: Jocelyn [name];  Service: Radiology;  Laterality: N/A;    Social History   Socioeconomic History   Marital status: Widowed    Spouse name: Not on file   Number of children: 5   Years of education: Not on file   Highest education level: Not on file  Occupational History   Not on file  Tobacco Use   Smoking status: Never   Smokeless tobacco: Never  Vaping Use   Vaping status: Never Used  Substance and Sexual Activity   Alcohol use: No    Alcohol/week: 0.0 standard drinks of alcohol   Drug use: No   Sexual activity: Not Currently  Other Topics Concern   Not on file  Social History Narrative   Not on file   Social Drivers of Health   Financial Resource Strain: Medium Risk (07/26/2018)   Overall Financial Resource Strain (CARDIA)    Difficulty of Paying Living Expenses: Somewhat hard  Food Insecurity: Patient Unable To Answer (05/10/2023)   Hunger Vital Sign    Worried About Running Out of Food in the Last Year: Patient unable to answer    Ran Out of Food in the Last Year: Patient unable to answer  Transportation Needs: Unknown (05/10/2023)   PRAPARE - Transportation    Lack of Transportation (Medical): Patient unable to answer    Lack of Transportation (Non-Medical): No  Physical Activity: Inactive (07/26/2018)   Exercise Vital Sign    Days of Exercise per Week: 0 days    Minutes of Exercise per Session: 0 min  Stress: No Stress Concern Present (07/26/2018)   [employer] of Occupational Health - Occupational Stress Questionnaire    Feeling of Stress : Not at all  Social Connections: Moderately Isolated (05/11/2023)   Social Connection and Isolation Panel [NHANES]    Frequency of Communication with Friends and Family: More than three times a week    Frequency of Social Gatherings with Friends and Family: Three  times a week    Attends Religious Services: More than 4 times per year    Active Member of Clubs [name] Organizations: No    Attends Banker Meetings: Never    Marital Status: Widowed  Intimate Partner Violence: Patient Unable To Answer (05/10/2023)   Humiliation, Afraid, Rape, and Kick questionnaire    Fear of Current [name] Ex-Partner: Patient unable to answer    Emotionally Abused: Patient unable to answer    Physically Abused: Patient unable to answer    Sexually Abused: Patient unable to answer    Family History  Problem Relation Age of Onset   Diabetes Mother    Heart disease Mother    Cancer Father        lung; smoker   Hypertension Daughter  Diabetes Daughter    Cancer Daughter        breast   Anuerysm Son    Breast cancer Neg Hx    No Known Allergies I? Current Facility-Administered Medications  Medication Dose Route Frequency Provider Last Rate Last Admin   acetaminophen (TYLENOL) tablet 650 mg  650 mg Oral Q6H PRN Verdene Lennert, [name]   650 mg at 05/11/23 0146   [name]   acetaminophen (TYLENOL) suppository 650 mg  650 mg Rectal Q6H PRN Verdene Lennert, [name]       irbesartan (AVAPRO) tablet 300 mg  300 mg Oral Daily Verdene Lennert, [name]   300 mg at 05/11/23 4098   And   amLODipine (NORVASC) tablet 10 mg  10 mg Oral Daily Verdene Lennert, [name]   10 mg at 05/11/23 1191   And   hydrochlorothiazide (HYDRODIURIL) tablet 12.5 mg  12.5 mg Oral Daily Verdene Lennert, [name]   12.5 mg at 05/11/23 4782   ascorbic acid (VITAMIN C) tablet 250 mg  250 mg Oral Daily Verdene Lennert, [name]   250 mg at 05/11/23 0912   atorvastatin (LIPITOR) tablet 40 mg  40 mg Oral QHS Verdene Lennert, [name]   40 mg at 05/10/23 2141   cefTRIAXone (ROCEPHIN) 2 g in sodium chloride 0.9 % 100 mL IVPB  2 g Intravenous Q24H Verdene Lennert, [name] 200 mL/hr at 05/10/23 1651 2 g at 05/10/23 1651   famotidine (PEPCID) tablet 20 mg  20 mg Oral Daily Verdene Lennert, [name]   20 mg at 05/11/23 0912   ferrous sulfate 220 (44 Fe)  MG/5ML solution 325 mg  325 mg Oral Daily Verdene Lennert, [name]   325 mg at 05/11/23 0911   furosemide (LASIX) tablet 40 mg  40 mg Oral Daily Verdene Lennert, [name]   40 mg at 05/11/23 0912   heparin injection 5,000 Units  5,000 Units Subcutaneous Q12H Dezii, Alexandra, DO   5,000 Units at 05/11/23 0912   ipratropium-albuterol (DUONEB) 0.5-2.5 (3) MG/3ML nebulizer solution 3 mL  3 mL Nebulization Q6H PRN Verdene Lennert, [name]       isosorbide mononitrate (IMDUR) 24 hr tablet 120 mg  120 mg Oral Daily Verdene Lennert, [name]   120 mg at 05/11/23 0912   metroNIDAZOLE (FLAGYL) IVPB 500 mg  500 mg Intravenous Q12H Verdene Lennert, [name] 100 mL/hr at 05/11/23 0537 500 mg at 05/11/23 0537   ondansetron (ZOFRAN) tablet 4 mg  4 mg Oral Q6H PRN Verdene Lennert, [name]       [name]   ondansetron (ZOFRAN) injection 4 mg  4 mg Intravenous Q6H PRN Verdene Lennert, [name]       oxyCODONE (Oxy IR/ROXICODONE) immediate release tablet 5 mg  5 mg Oral Q6H PRN Verdene Lennert, [name]   5 mg at 05/11/23 1252   polyethylene glycol (MIRALAX / GLYCOLAX) packet 17 g  17 g Oral Daily PRN Verdene Lennert, [name]       sodium chloride flush (NS) 0.9 % injection 3 mL  3 mL Intravenous Q12H Verdene Lennert, [name]   3 mL at 05/11/23 0913   spironolactone (ALDACTONE) tablet 12.5 mg  12.5 mg Oral Daily Verdene Lennert, [name]   12.5 mg at 05/11/23 0912   vancomycin (VANCOREADY) IVPB 1750 mg/350 mL  1,750 mg Intravenous Q48H Verdene Lennert, [name] 175 mL/hr at 05/10/23 1734 1,750 mg at 05/10/23 1734     Abtx:  Anti-infectives (From admission, onward)    Start     Dose/Rate Route Frequency Ordered Stop   05/10/23  1800  vancomycin (VANCOREADY) IVPB 1750 mg/350 mL        1,750 mg 175 mL/hr over 120 Minutes Intravenous Every 48 hours 05/09/23 0149     05/09/23 1800  cefTRIAXone (ROCEPHIN) 2 g in sodium chloride 0.9 % 100 mL IVPB        2 g 200 mL/hr over 30 Minutes Intravenous Every 24 hours 05/08/23 1823     05/09/23 0100  vancomycin (VANCOREADY) IVPB 500 mg/100 mL         500 mg 100 mL/hr over 60 Minutes Intravenous  Once 05/09/23 0055 05/09/23 0254   05/08/23 1830  metroNIDAZOLE (FLAGYL) IVPB 500 mg        500 mg 100 mL/hr over 60 Minutes Intravenous Every 12 hours 05/08/23 1822     05/08/23 1715  cefTRIAXone (ROCEPHIN) 2 g in sodium chloride 0.9 % 100 mL IVPB        2 g 200 mL/hr over 30 Minutes Intravenous Once 05/08/23 1709 05/08/23 1755   05/08/23 1715  vancomycin (VANCOCIN) IVPB 1000 mg/200 mL premix        1,000 mg 200 mL/hr over 60 Minutes Intravenous  Once 05/08/23 1709 05/08/23 2000       REVIEW OF SYSTEMS:  NA Objective:  VITALS:  BP (!) 156/56 (BP Location: Left Arm)   Pulse 99   Temp 98.4 F (36.9 C) (Oral)   Resp 16   Wt 63.5 kg   SpO2 98%   BMI 24.80 kg/m   PHYSICAL EXAM:  General: awake, responds to simple questions, oriented in self .  Head: Normocephalic, without obvious abnormality, atraumatic. Eyes: Conjunctivae clear, anicteric sclerae. Pupils are equal ENT Nares normal. No drainage [name] sinus tenderness. Lips, mucosa, and tongue normal. No Thrush Neck:  symmetrical, no adenopathy, thyroid: non tender no carotid bruit and no JVD.  Lungs: b/l air entry. Heart: irregular Abdomen: Soft, peg in place Extremities: left heel ulcer of 5 cm covered with eschar tender       03/14/23   05/08/23 Wound on the achilles tendon- clean   Skin: as above Lymph: Cervical, supraclavicular normal. Neurologic: Grossly non-focal Pertinent Labs Lab Results CBC    Component Value Date/Time   WBC 6.3 05/09/2023 0624   RBC 4.74 05/09/2023 0624   HGB 11.6 (L) 05/09/2023 0624   HGB 12.5 09/19/2022 0916   HCT 37.1 05/09/2023 0624   HCT 39.6 09/19/2022 0916   PLT 215 05/09/2023 0624   PLT 169 09/19/2022 0916   MCV 78.3 (L) 05/09/2023 0624   MCV 87 09/19/2022 0916   MCV 82 04/13/2011 1125   MCH 24.5 (L) 05/09/2023 0624   MCHC 31.3 05/09/2023 0624   RDW 18.8 (H) 05/09/2023 0624   RDW 12.9 09/19/2022 0916   RDW 14.4  04/13/2011 1125   LYMPHSABS 2.7 05/08/2023 1454   LYMPHSABS 3.7 (H) 07/08/2015 1136   MONOABS 0.5 05/08/2023 1454   EOSABS 0.2 05/08/2023 1454   EOSABS 0.2 07/08/2015 1136   BASOSABS 0.0 05/08/2023 1454   BASOSABS 0.1 07/08/2015 1136       Latest Ref Rng & Units 05/11/2023    4:41 AM 05/10/2023    4:43 AM 05/09/2023    6:24 AM  CMP  Glucose 70 - 99 mg/dL  528  413   BUN 8 - 23 mg/dL  8  7   Creatinine 2.44 - 1.00 mg/dL  0.10  2.72   Sodium 536 - 145 mmol/L  137  137   Potassium 3.5 -  5.1 mmol/L 3.7  3.4  3.0   Chloride 98 - 111 mmol/L  104  103   CO2 22 - 32 mmol/L  26  27   Calcium 8.9 - 10.3 mg/dL  8.9  9.0       Microbiology: Recent Results (from the past 240 hours)  Blood culture (routine x 2)     Status: None (Preliminary result)   Collection Time: 05/08/23  5:01 PM   Specimen: BLOOD RIGHT ARM  Result Value Ref Range Status   Specimen Description BLOOD RIGHT ARM  Final   Special Requests   Final    BOTTLES DRAWN AEROBIC AND ANAEROBIC Blood Culture results may not be optimal due to an inadequate volume of blood received in culture bottles   Culture   Final    NO GROWTH 3 DAYS Performed at [facility], [address] [place], [name] [address]    Report Status PENDING  Incomplete  Blood culture (routine x 2)     Status: None (Preliminary result)   Collection Time: 05/09/23 10:18 AM   Specimen: BLOOD RIGHT HAND  Result Value Ref Range Status   Specimen Description BLOOD RIGHT HAND  Final   Special Requests   Final    BOTTLES DRAWN AEROBIC AND ANAEROBIC Blood Culture results may not be optimal due to an inadequate volume of blood received in culture bottles   Culture   Final    NO GROWTH 2 DAYS Performed at [facility], [address] [place], [name] [address]    Report Status PENDING  Incomplete    IMAGING RESULTS: Xray left foot- no calcaneal bone involvement I have personally reviewed the films ? Impression/Recommendation ?left heel  decubitus ulcer covered with eschar- not obviously infected- no systemic symptoms, no leucocytosis Another ulcer above that on achilles tendon is clean as well She has PAD with absent pulse. AKA has been recommended but family want to manage conservatively Healing is not possible due to PAD There is no need for systemic antibiotics- risk of antibiotics outweighs benefit currently  topical care to be optimized. She is getting vashe and kerlex now- may ask wound care to reevaluate for santyl [name] iodosorb  Off the leg and float on pillows [name] blue boot PAD  H/o CVA SAH Afib not on AC due to the above  Dementia  Discussed the management with Dr.Lai  ? ID will sign off- call if needed?Marland Kitchen

## 2023-05-11 NOTE — Plan of Care (Signed)

## 2023-05-11 NOTE — Consult Note (Signed)
 PHARMACY CONSULT NOTE - ELECTROLYTES  Pharmacy Consult for Electrolyte Monitoring and Replacement   Recent Labs: Weight: 63.5 kg (139 lb 15.9 oz) Estimated Creatinine Clearance: 42.7 mL/min (by C-G formula based on SCr of 0.49 mg/dL). Potassium (mmol/L)  Date Value  05/11/2023 3.7  04/13/2011 3.4 (L)   Magnesium (mg/dL)  Date Value  56/43/3295 1.8   Calcium (mg/dL)  Date Value  18/84/1660 8.9   Calcium, Total (mg/dL)  Date Value  63/02/6008 9.2   Albumin (g/dL)  Date Value  93/23/5573 3.0 (L)  12/07/2022 4.4  04/13/2011 4.0   Phosphorus (mg/dL)  Date Value  22/03/5425 3.4   Sodium (mmol/L)  Date Value  05/10/2023 137  12/07/2022 143  04/13/2011 141    Assessment  Jocelyn [name] is a 89 y.o. female presenting with   wound infection. 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 Pharmacy has been consulted to monitor and replace electrolytes.  Diet: regular diet MIVF: IV lock Pertinent medications:  lasix 40mg  po daily, Irbesartan/amlodipine/Hydrochlorothiazide, spironolactone  Goal of Therapy: Electrolytes WNL K>4.0 and Mag>2.0 w/ Afib  Plan:  K 3.7  Will order KCL 40 meq po q4h x 2 doses again today.  Patient on lasix 40mg  po daily, Irbesartan/amlodipine/Hydrochlorothiazide, spironolactone.  Goal K>4.0 with Afib Check electrolytes with AM labs  Bari Mantis PharmD Clinical Pharmacist 05/11/2023

## 2023-05-11 NOTE — Progress Notes (Signed)
 PROGRESS NOTE    Jocelyn [name]  ZOX:[id number] DOB: 1931-04-09 DOA: 05/08/2023 PCP: Dudley Major, FNP  139A/139A-BB  LOS: 2 days   Brief hospital course:   Assessment & Plan: Jocelyn [name] is 89 y.o. 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  Per patient's daughter the patient has a wound care nurse at home, who had concern last week that PEG tube had increasing purulence and surrounding infection.  On 4/7 wound care RN noted increased discharge from heel ulcer and called EMS.    Chronic Heel ulcer - Chronic heel ulcer for which patient has been following with orthopedic surgery and vascular surgery.  Both specialists have recommended amputation or hospice.  - Podiatry consulted again on this admission and agrees with recommendations for amputation, abx are probably futile given poor vascularity the area, abx /wound care is palliative and not curative in nature, however, pt and family currently declined amputation. - started on on broad-spectrum antibiotics --ID consult today --d/c abx, no need per ID --need chemical debridement of wound  Hypokalemia --monitor and supplement PRN  Hypomag --monitor and supplement PRN   Dysphagia 2/2 stroke Status post PEG tube placement - Patient reports she is no longer using PEG since Feb, and they have been seeking removal outpatient - PEG site appears noninfected.  Some scant drainage noted.  Tube flushes ok. --will leave tube in since it's functional, per daughter   Chronic diastolic heart failure - Last echo November 2024 with severe concentric LVH.  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  ABIs demonstrated adequate blood flow as of February 2025 - No evidence of acute ischemia on exam --cont statin   Chronic A-fib - History of A-fib previously on Eliquis, discontinued given SAH   OSA on CPAP - Continue CPAP at bedtime   Hypertension --cont lasix, irbesartan, amlodipine, hydrochlorothiazide, Imdur and spironolactone   DVT prophylaxis: Heparin SQ Code Status: DNR  Family Communication:  Level of care: Med-Surg Dispo:   The patient is from: home Anticipated d/c is to: home Anticipated d/c date is: tomorrow   Subjective and Interval History:  No new event today.   Objective: Vitals:   05/10/23 2053 05/11/23 0432 05/11/23 0845 05/11/23 1608  BP: (!) 128/58 139/67 (!) 156/56 128/63  Pulse: 89 99 99 96  Resp:   16 16  Temp: 99.2 F (37.3 C)  98.4 F (36.9 C) 98.8 F (37.1 C)  TempSrc: Oral  Oral Oral  SpO2:  100% 98% 96%  Weight:        Intake/Output Summary (Last 24 hours) at 05/11/2023 1826 Last data filed at 05/11/2023 1115 Gross per 24 hour  Intake 0 ml  Output 400 ml  Net -400 ml   Filed Weights   05/09/23 0054  Weight: 63.5 kg    Examination:   Constitutional: NAD, alert HEENT: conjunctivae and lids normal, EOMI CV: No cyanosis.   RESP: normal respiratory effort, on RA  Extremities: left foot wrapped   Data Reviewed: I have personally reviewed labs and imaging studies  Time spent: 35 minutes  Darlin Priestly, MD [facility] If 7PM-7AM, please contact night-coverage 05/11/2023, 6:26 PM

## 2023-05-12 DIAGNOSIS — L089 Local infection of the skin and subcutaneous tissue, unspecified: Secondary | ICD-10-CM | POA: Diagnosis not present

## 2023-05-12 DIAGNOSIS — L97423 Non-pressure chronic ulcer of left heel and midfoot with necrosis of muscle: Secondary | ICD-10-CM | POA: Diagnosis not present

## 2023-05-12 DIAGNOSIS — Z515 Encounter for palliative care: Secondary | ICD-10-CM

## 2023-05-12 LAB — MAGNESIUM: Magnesium: 1.7 mg/dL (ref 1.7–2.4)

## 2023-05-12 LAB — CREATININE, SERUM
Creatinine, Ser: 0.49 mg/dL (ref 0.44–1.00)
GFR, Estimated: >60 mL/min (ref >60)

## 2023-05-12 LAB — POTASSIUM: Potassium: 4.1 mmol/L (ref 3.5–5.1)

## 2023-05-12 MED ORDER — TRAZODONE HCL 50 MG PO TABS
25.0000 mg | ORAL_TABLET | Freq: Once | ORAL | Status: AC
  Administered 2023-05-12: 25 mg via ORAL
  Filled 2023-05-12: qty 1

## 2023-05-12 MED ORDER — CADEXOMER IODINE 0.9 % EX GEL
CUTANEOUS | Status: DC
  Filled 2023-05-12: qty 40
  Filled 2023-05-12: qty 1

## 2023-05-12 MED ORDER — MAGNESIUM SULFATE 2 GM/50ML IV SOLN
2.0000 g | Freq: Once | INTRAVENOUS | Status: AC
  Administered 2023-05-12: 2 g via INTRAVENOUS
  Filled 2023-05-12: qty 50

## 2023-05-12 NOTE — TOC Progression Note (Signed)
 Transition of Care [facility]) - Progression Note    Patient Details  Name: Jocelyn [name] MRN: [id number] Date of Birth: November 29, 1931  [facility] CM/SW Contact  Liliana Cline, LCSW Phone Number: 05/12/2023, 2:53 PM  Clinical Narrative:    CSW attempted call to daughter Ladean Raya to discuss DC planning - no answer and VM full.   Expected Discharge Plan: Home w Home Health Services    Expected Discharge Plan and Services                                               Social Determinants of Health (SDOH) Interventions SDOH Screenings   Food Insecurity: Patient Unable To Answer (05/10/2023)  Housing: Patient Unable To Answer (05/10/2023)  Transportation Needs: Unknown (05/10/2023)  Utilities: Patient Unable To Answer (05/10/2023)  Alcohol Screen: Low Risk  (12/03/2018)  Depression (PHQ2-9): Low Risk  (12/06/2022)  Financial Resource Strain: Medium Risk (07/26/2018)  Physical Activity: Inactive (07/26/2018)  Social Connections: Moderately Isolated (05/11/2023)  Stress: No Stress Concern Present (07/26/2018)  Tobacco Use: Low Risk  (05/08/2023)    Readmission Risk Interventions     No data to display

## 2023-05-12 NOTE — Progress Notes (Signed)
 PROGRESS NOTE    Jocelyn [name]  WUJ:[id number] DOB: [birth year] DOA: 05/08/2023 PCP: Dudley Major, FNP  139A/139A-BB  LOS: 3 days   Brief hospital course:   Assessment & Plan: Jocelyn [name] is 89 y.o. 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  Per patient's daughter the patient has a wound care nurse at home, who had concern last week that PEG tube had increasing purulence and surrounding infection.  On 4/7 wound care RN noted increased discharge from heel ulcer and called EMS.    Chronic left Heel ulcer - Chronic heel ulcer for which patient has been following with orthopedic surgery and vascular surgery.  Both specialists have recommended amputation or hospice.  - Podiatry consulted again on this admission and agrees with recommendations for amputation, abx are probably futile given poor vascularity the area, abx /wound care is palliative and not curative in nature, however, pt and family currently declined amputation. - started on on broad-spectrum antibiotics, which were d/c'ed, since no need, per ID. --wound care RN re-consulted, and after discussion with ID, decided on using Iodosorb for wound care, see wound care order.  Hypokalemia Hypomag --monitor and supplement PRN   Dysphagia 2/2 stroke Status post PEG tube placement - family reports she is no longer using PEG since Feb, and they have been seeking removal outpatient - PEG site appears noninfected.  Some scant drainage noted.  Tube flushes ok. --will leave tube in since it's functional, per daughter.  Will need specific order numbers for the flushing material for [facility] RN to  order.   Chronic diastolic heart failure - Last echo November 2024 with severe concentric LVH.  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  ABIs demonstrated adequate blood flow as of February 2025 - No evidence of acute ischemia on exam --cont statin   Chronic A-fib - History of A-fib previously on Eliquis, discontinued given SAH   OSA on CPAP - Continue CPAP at bedtime   Hypertension --cont lasix, irbesartan, amlodipine, hydrochlorothiazide, Imdur and spironolactone   DVT prophylaxis: Heparin SQ Code Status: DNR  Family Communication: daughter updated at bedside today Level of care: Med-Surg Dispo:   The patient is from: home Anticipated d/c is to: to be determined Anticipated d/c date is: medically ready for discharge now.   Subjective and Interval History:  Daughter reported pt more confused.  During rounds, Pt was eating while being fed.  Was planning on discharging pt home today, but later in the day, daughter requested eval for SNF rehab.   Objective: Vitals:   05/11/23 2024 05/12/23 0601 05/12/23 0856 05/12/23 1624  BP: (!) 101/56 (!) 122/56 131/79 101/66  Pulse: 99 93 (!) 48 95  Resp: 18   16  Temp: 100 F (37.8 C)  97.8 F (36.6 C) 98.1 F (36.7 C)  TempSrc:      SpO2: 98% 97% 91% 96%  Weight:        Intake/Output  Summary (Last 24 hours) at 05/12/2023 1942 Last data filed at 05/12/2023 1900 Gross per 24 hour  Intake 600 ml  Output 700 ml  Net -100 ml   Filed Weights   05/09/23 0054  Weight: 63.5 kg    Examination:   Constitutional: NAD, alert HEENT: conjunctivae and lids normal, EOMI CV: No  cyanosis.   RESP: normal respiratory effort, on RA Neuro: II - XII grossly intact.     Data Reviewed: I have personally reviewed labs and imaging studies  Time spent: 50 minutes  Darlin Priestly, MD [facility] If 7PM-7AM, please contact night-coverage 05/12/2023, 7:42 PM

## 2023-05-12 NOTE — Consult Note (Signed)
 PHARMACY CONSULT NOTE - ELECTROLYTES  Pharmacy Consult for Electrolyte Monitoring and Replacement   Recent Labs: Weight: 63.5 kg (139 lb 15.9 oz) Estimated Creatinine Clearance: 42.7 mL/min (by C-G formula based on SCr of 0.49 mg/dL). Potassium (mmol/L)  Date Value  05/12/2023 4.1  04/13/2011 3.4 (L)   Magnesium (mg/dL)  Date Value  16/11/9602 1.7   Calcium (mg/dL)  Date Value  54/10/8117 8.9   Calcium, Total (mg/dL)  Date Value  14/78/2956 9.2   Albumin (g/dL)  Date Value  21/30/8657 3.0 (L)  12/07/2022 4.4  04/13/2011 4.0   Phosphorus (mg/dL)  Date Value  84/69/6295 3.4   Sodium (mmol/L)  Date Value  05/10/2023 137  12/07/2022 143  04/13/2011 141    Assessment  Jocelyn [name] is a 89 y.o. female presenting with   wound infection. 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 Pharmacy has been consulted to monitor and replace electrolytes.  Diet: regular diet MIVF: IV lock Pertinent medications:  lasix 40mg  po daily, Irbesartan/amlodipine/Hydrochlorothiazide, spironolactone  Goal of Therapy: Electrolytes WNL K>4.0 and Mag>2.0 w/ Afib  Plan:  Mg 1.7 >> magnesium sulfate 2g IV x 1 No other electrolyte replacement currently warranted Check electrolytes next with AM labs  Will M. Dareen Piano, PharmD Clinical Pharmacist 05/12/2023 9:07 AM

## 2023-05-12 NOTE — Plan of Care (Signed)
  Problem: Education: Goal: Knowledge of General Education information will improve Description: Including pain rating scale, medication(s)/side effects and non-pharmacologic comfort measures Outcome: Progressing   Problem: Nutrition: Goal: Adequate nutrition will be maintained Outcome: Progressing   Problem: Pain Managment: Goal: General experience of comfort will improve and/or be controlled Outcome: Progressing

## 2023-05-12 NOTE — Plan of Care (Signed)

## 2023-05-12 NOTE — Care Management Important Message (Signed)
 Important Message  Patient Details  Name: Jocelyn [name] MRN: [id number] Date of Birth: 07-Jan-1932   Important Message Given:  Yes - Medicare IM     Cristela Blue, CMA 05/12/2023, 11:45 AM

## 2023-05-12 NOTE — Consult Note (Addendum)
 WOC team reconsulted for possible debridement of L heel wound with Santyl.  See prior consult 05/09/2023 where Xeroform was ordered for stable eschar pending podiatry consult.    Vascular and ortho have recommended amputation vs. Palliative wound care.  Podiatry deferred any type of surgical debridement.  After discussion with ID will change wound care to Iodosorb (pending its availability) every other day.  Secure chat sent to primary MD, primary nurse and ID regarding this.    Thank you,    Priscella Mann MSN, RN-BC, [employer] [phone]

## 2023-05-12 NOTE — Progress Notes (Signed)
 PT Cancellation Note  Patient Details Name: Jocelyn [name] MRN: [id number] DOB: 1931-04-27   Cancelled Treatment:    Reason Eval/Treat Not Completed: Pain limiting ability to participate.  PT consult received.  Chart reviewed.  Pt resting in bed upon PT arrival; pt's daughter present; pt declining PT session d/t L heel pain (nurse present end of attempted session to give pain medication).  Will re-attempt PT evaluation at a later date/time.  Hendricks Limes, PT 05/12/23, 4:57 PM

## 2023-05-12 NOTE — Consult Note (Signed)
 Consultation Note Date: 05/12/2023   Patient Name: Jocelyn [name]  DOB: Apr 19, 1931  MRN: [id number]  Age / Sex: 89 y.o., female  PCP: Dudley Major, FNP Referring Physician: Darlin Priestly, MD  Reason for Consultation: Establishing goals of care   HPI/Brief Hospital Course: 89 y.o. 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 Fib, HTN, PAD with chronic left heel ulcer, dementia, OSA on CPAP and CHF admitted from home on 05/08/2023 with worsening appearance of wound with concern for infection.   Followed at home by home health nurse, concern related to draining from PEG as well as worsening appearance to left heel wound, as complaining of abdominal pain  Has been followed by both orthopedics and vascular specialists as outpatient, have recommended amputation which Ms. Taliaferro has declined in the past  Vascular surgery consulted this admission-again recommending amputation which again is being declined ID consulted-no need for continuing with antibiotic therapy at this time  Palliative medicine was consulted for assisting with goals of care conversations.  Subjective:  Extensive chart review has been completed prior to meeting patient including labs, vital signs, imaging, progress notes, orders, and available advanced directive documents from current and previous encounters.  Visited with Ms. Bleicher at her bedside.  She is awake, alert, sitting up in bed.  She is unable to answer orientation questions appropriately.  Daughter Ladean Raya at bedside during time of visit.  Introduced myself as a Publishing rights manager as a member of the palliative care team. Explained palliative medicine is specialized medical care for people living with serious illness. It focuses on providing relief from the symptoms and stress of a serious illness. The goal is to  improve quality of life for both the patient and the family.   Ladean Raya shares since initial admission back in December 2024 Ms. Beitz has required assistance in her home from family and home health services.  Prior to her admission in December, Ms. Schlack functioned and lived independently.  Constant shares she has 3 other siblings, 2 brothers and 1 sister.  All children are involved except 1 of Ms. Cleavenger's sons has minimal contact with the family.  Ms. Predmore currently lives in the home with her son but her other children, grandchildren and other family members serve as her primary caretakers.  Ladean Raya shares at baseline Ms. Waggoner spends the majority of the time in her bed.  She requires assistance with all ADLs.  Ladean Raya shares Ms. Pietsch has never completed advanced directive documents in the past.  She shares she and her siblings are in communication but they do not always agree on goals and plan of care.  In the past when their father had declining health they appointed Ladean Raya as spokesperson but she is aware decisions will have to be collective among all of Mr. Borger children.  Ladean Raya shares her understanding of Ms. Spenser's current medical condition.  She is aware that the team is recommending amputation but Ms. Suitt has declined in the past and the family wishes to continue decline as they hope to honor Ms. Krzyzanowski's wishes.  Ladean Raya shares she understands without amputation wound will continue to worsen with high risk of worsening infection that would likely lead to her death.  Constant shares other members of the medical team have told her without amputation Ms. Stith's prognosis is less than 6 months.  We discussed patient's current illness and what it means in the  larger context of patient's on-going co-morbidities. Natural disease trajectory and expectations at EOL were discussed.   The difference between aggressive medical intervention and comfort care was  discussed.  Ladean Raya shares she and her siblings are all in agreement to not proceed with amputation but some of her siblings are struggling with excepting the high risk of Ms. Craker's decompensation.  Ladean Raya shares the family has utilized hospice services in the past for other loved ones but did not have a positive experience which will complicate the family agreement to seeking hospice services to follow Ms. Shiley at discharge.  We discussed the difference between outpatient palliative and hospice services following at discharge. Ladean Raya shares she will need to have these conversations with her siblings, would prefer to have conversations with medical team included as well with her siblings. [name] plans to reach out to her siblings today and will set a time for meeting over the weekend.  I discussed importance of continued conversations with family/support persons and all members of their medical team regarding overall plan of care and treatment options   All questions/concerns addressed. Emotional support provided to patient/family/support persons. PMT will continue to follow and support patient as needed.  Objective: Primary Diagnoses: Present on Admission:  Heel ulcer (HCC)  Essential hypertension  Chronic a-fib (HCC)  Wound infection   Physical Exam Constitutional:      General: She is not in acute distress.    Appearance: She is ill-appearing.  Pulmonary:     Effort: Pulmonary effort is normal. No respiratory distress.  Skin:    General: Skin is warm and dry.  Neurological:     Mental Status: She is alert.     Motor: Weakness present.     Vital Signs: BP 131/79 (BP Location: Right Arm)   Pulse (!) 48   Temp 97.8 F (36.6 C)   Resp 18   Wt 63.5 kg   SpO2 91%   BMI 24.80 kg/m  Pain Scale: 0-10   Pain Score: 3    IO: Intake/output summary:  Intake/Output Summary (Last 24 hours) at 05/12/2023 1554 Last data filed at 05/12/2023 1128 Gross per 24 hour   Intake 360 ml  Output 700 ml  Net -340 ml    LBM: Last BM Date : 05/12/23 Baseline Weight: Weight: 63.5 kg Most recent weight: Weight: 63.5 kg       Palliative Assessment/Data:40%   Assessment and Plan  SUMMARY OF RECOMMENDATIONS   Pending family conversation--outpatient palliative versus hospice  Palliative Prophylaxis:   Bowel Regimen, Delirium Protocol and Frequent Pain Assessment  Thank you for this consult and allowing Palliative Medicine to participate in the care of Analaya W. Toni Arthurs. Palliative medicine will continue to follow and assist as needed.   Time Total: 75 minutes  Time spent includes: Detailed review of medical records (labs, imaging, vital signs), medically appropriate exam (mental status, respiratory, cardiac, skin), discussed with treatment team, counseling and educating patient, family and staff, documenting clinical information, medication management and coordination of care.   Signed by: Leeanne Deed, DNP, AGNP-C Palliative Medicine    Please contact Palliative Medicine Team phone at [phone] for questions and concerns.  For individual provider: See Loretha Stapler

## 2023-05-13 DIAGNOSIS — F0390 Unspecified dementia without behavioral disturbance: Secondary | ICD-10-CM | POA: Diagnosis not present

## 2023-05-13 DIAGNOSIS — I739 Peripheral vascular disease, unspecified: Secondary | ICD-10-CM | POA: Diagnosis not present

## 2023-05-13 DIAGNOSIS — I10 Essential (primary) hypertension: Secondary | ICD-10-CM | POA: Diagnosis not present

## 2023-05-13 DIAGNOSIS — Z515 Encounter for palliative care: Secondary | ICD-10-CM | POA: Diagnosis not present

## 2023-05-13 DIAGNOSIS — G4733 Obstructive sleep apnea (adult) (pediatric): Secondary | ICD-10-CM | POA: Diagnosis not present

## 2023-05-13 DIAGNOSIS — L97423 Non-pressure chronic ulcer of left heel and midfoot with necrosis of muscle: Secondary | ICD-10-CM | POA: Diagnosis not present

## 2023-05-13 LAB — BASIC METABOLIC PANEL WITH GFR
Anion gap: 5 (ref 5–15)
BUN: 9 mg/dL (ref 8–23)
CO2: 25 mmol/L (ref 22–32)
Calcium: 9.1 mg/dL (ref 8.9–10.3)
Chloride: 105 mmol/L (ref 98–111)
Creatinine, Ser: 0.53 mg/dL (ref 0.44–1.00)
GFR, Estimated: >60 mL/min (ref >60)
Glucose, Bld: 93 mg/dL (ref 70–99)
Potassium: 3.9 mmol/L (ref 3.5–5.1)
Sodium: 135 mmol/L (ref 135–145)

## 2023-05-13 LAB — CULTURE, BLOOD (ROUTINE X 2): Culture: NO GROWTH

## 2023-05-13 LAB — MAGNESIUM: Magnesium: 2.1 mg/dL (ref 1.7–2.4)

## 2023-05-13 MED ORDER — POTASSIUM CHLORIDE CRYS ER 20 MEQ PO TBCR
20.0000 meq | EXTENDED_RELEASE_TABLET | Freq: Once | ORAL | Status: AC
  Administered 2023-05-13: 20 meq via ORAL
  Filled 2023-05-13: qty 1

## 2023-05-13 MED ORDER — TRAZODONE HCL 50 MG PO TABS
25.0000 mg | ORAL_TABLET | Freq: Every evening | ORAL | Status: DC | PRN
  Administered 2023-05-13: 25 mg via ORAL
  Filled 2023-05-13: qty 1

## 2023-05-13 NOTE — Progress Notes (Signed)
 Daily Progress Note   Patient Name: Jocelyn [name]       Date: 05/13/2023 DOB: [birth year]  Age: 89 y.o. MRN#: [id number] Attending Physician: Melvinia Stager, MD Primary Care Physician: Chucky Craver, FNP Admit Date: 05/08/2023  Reason for Consultation/Follow-up: Establishing goals of care  HPI/Brief Hospital Review: 89 y.o. 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 Fib, HTN, PAD with chronic left heel ulcer, dementia, OSA on CPAP and CHF admitted from home on 05/08/2023 with worsening appearance of wound with concern for infection.    Followed at home by home health nurse, concern related to draining from PEG as well as worsening appearance to left heel wound, as complaining of abdominal pain   Has been followed by both orthopedics and vascular specialists as outpatient, have recommended amputation which Jocelyn [name] has declined in the past   Vascular surgery consulted this admission-again recommending amputation which again is being declined ID consulted-no need for continuing with antibiotic therapy at this time   Palliative medicine was consulted for assisting with goals of care conversations.  Subjective: Extensive chart review has been completed prior to meeting patient including labs, vital signs, imaging, progress notes, orders, and available advanced directive documents from current and previous encounters.    Visited with Jocelyn [name] at her bedside. She is awake, alert, unable to participate in goals of care conversations as she is unable to appropriately answer orientation questions. She is unable to participate in review of symptoms.  Son Gaetana [name] at bedside during  time of visit and had assisted Jocelyn [name] with her breakfast.  Conversations had with Gaetana [name] as well as Dr. Lydia Sams regarding current medical condition.  We discussed limited options and interventions available for chronic heel ulcer.  Discussed amputation as well as risk associated with surgery and concerns related to recovery postoperation.  Gaetana [name] also shares and confirms Jocelyn [name] in the past has declined amputation surgery.  Discussions were had on options available at discharge.  Reviewed conversations had yesterday with his sister [name] regarding palliative versus hospice care in the home.  Gaetana [name] shares he and [name] had some discussions overnight and were attempting to have conversations with their other siblings.  Plan made for all siblings to come together  around noon and have ongoing discussions at that time.  Returned to bedside and met with Margeret Sheer, granddaughter and son-in-law.  Gaetana [name] and [place] share they were able to contact their siblings who chose not to come to bedside to be a part of conversations but were in agreement to proceed with what ever decisions Gaetana [name] and [place] made.  Reviewed conversations had with [facility] yesterday with other family members present.  Discussed in detail the difference between returning home with home health and outpatient palliative services following versus returning home with hospice services following in the home.  Reviewed the overall philosophy of hospice.  Reviewed eligibility criteria for the inpatient unit for hospice.  Discussed with family that opting for hospice at home would require family to continue providing much of the care for Jocelyn [name].  [name] again confirms Jocelyn [name] has been very clear in the past she wishes to decline amputation.  [name] and Gaetana [name] share they are both in agreement to proceed with hospice at home.  They do not have a preference of hospice agency but could potentially be interested in the [facility] once Ms.  [name] becomes eligible.  Engaged with Dr. Lydia Sams and [facility] regarding family discussions.  Awaiting TOC to all for hospice agency choice to family, recommended they call and speak to daughter [name].  Answered and addressed all questions and concerns.  PMT to continue to follow for ongoing needs and support.  Care plan was discussed with primary team, nursing staff and TOC.  Thank you for allowing the Palliative Medicine Team to assist in the care of this patient.  Total time:  65 minutes  Time spent includes: Detailed review of medical records (labs, imaging, vital signs), medically appropriate exam (mental status, respiratory, cardiac, skin), discussed with treatment team, counseling and educating patient, family and staff, documenting clinical information, medication management and coordination of care.  Isadore Marble, DNP, AGNP-C Palliative Medicine   Please contact Palliative Medicine Team phone at [phone] for questions and concerns.

## 2023-05-13 NOTE — Progress Notes (Signed)
 [facility]  - [place] [facility]   PATIENT NAME: Jocelyn [name]    MR#:  [id number]  DATE OF BIRTH:  [birth year]  SUBJECTIVE:  met with patient's son and palliative care at bedside. Patient laying comfortably. Appears pleasant and calm. Does have heel pain due to chronic ulcer.    VITALS:  Blood pressure 133/69, pulse 89, temperature 98.1 F (36.7 C), resp. rate 18, weight 63.5 kg, SpO2 97%.  PHYSICAL EXAMINATION:  limited GENERAL:  89 y.o.-year-old patient with no acute distress.  LUNGS: Normal breath sounds bilaterally CARDIOVASCULAR: S1, S2 normal.  ABDOMEN: Soft, nontender, nondistended.  EXTREMITIES:  NEUROLOGIC: nonfocal  patient is alert and awake     LABORATORY PANEL:  CBC Recent Labs  Lab 05/09/23 0624  WBC 6.3  HGB 11.6*  HCT 37.1  PLT 215    Chemistries  Recent Labs  Lab 05/08/23 1454 05/09/23 0624 05/13/23 0533  NA 139   < > 135  K 2.6*   < > 3.9  CL 103   < > 105  CO2 28   < > 25  GLUCOSE 109*   < > 93  BUN 10   < > 9  CREATININE 0.58   < > 0.53  CALCIUM 9.4   < > 9.1  MG 1.6*   < > 2.1  AST 18  --   --   ALT 11  --   --   ALKPHOS 52  --   --   BILITOT 0.8  --   --    < > = values in this interval not displayed.   Assessment and Plan  Jocelyn [name] is 89 y.o. 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  Per patient's daughter the patient has a wound care nurse at home, who had concern last week that PEG tube had increasing purulence and surrounding infection.  On 4/7 wound care RN noted increased discharge from heel ulcer and called EMS.    Chronic left Heel ulcer - Chronic  heel ulcer for which patient has been following with orthopedic surgery and vascular surgery.  Both specialists have recommended amputation or hospice.  - Podiatry consulted again on this admission and agrees with recommendations for amputation, abx are probably futile given poor vascularity the area, abx /wound care is palliative and not curative in nature, however, pt and family currently declined amputation. - started on on broad-spectrum antibiotics, which were d/c'ed, since no need, per ID. --wound care RN re-consulted, and after discussion with ID, decided on using Iodosorb for wound care, see wound care order.   Hypokalemia Hypomag --repleted and supplement PRN   Dysphagia 2/2 stroke Status post PEG tube placement - family reports she is no longer using PEG since Feb, and they have been seeking removal outpatient - PEG site appears noninfected.  Some scant drainage noted.  Tube flushes ok. --will leave tube in since it's functional, per daughter.  Will need specific order numbers for the flushing material for [facility] RN to order.   Chronic diastolic heart failure - Last echo November 2024 with severe concentric LVH.  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  ABIs demonstrated adequate blood flow as of February 2025 - No evidence of acute ischemia on exam --cont statin   Chronic A-fib - History of A-fib previously on Eliquis, discontinued given SAH   OSA on CPAP - Continue CPAP at bedtime   Hypertension --cont lasix, irbesartan, amlodipine, hydrochlorothiazide, Imdur and spironolactone     DVT prophylaxis: Heparin SQ Code  Status: DNR  Family Communication: son updated at bedside today Level of care: Med-Surg Dispo:   The patient is from: home Anticipated d/c date is: medically ready for discharge now. Palliative care met with patient's family. Their agreeable with patient going home with hospice services. TOC to set up hospice service.       TOTAL TIME TAKING CARE OF THIS PATIENT: 35 minutes.  >50% time spent on counselling and coordination of care  Note: This dictation was prepared with Dragon dictation along with smaller phrase technology. Any transcriptional errors that result from this process are unintentional.  Melvinia Stager M.D    [facility]   CC: Primary care physician; Chucky Craver, FNP

## 2023-05-13 NOTE — TOC Progression Note (Signed)
 Transition of Care [facility]) - Progression Note    Patient Details  Name: Jocelyn [name] MRN: [id number] Date of Birth: [birth year]  [facility] CM/SW Contact  Zoe Hinds, RN Phone Number: 05/13/2023, 4:11 PM  Clinical Narrative:    This CM rec'd update per covering MD Dr. Lydia Sams pt's family requesting [facility]. This CM spoke with pt's daughter [name] and confirmed plan for pt to dc with [facility] she did not have preference. This CM sent message to Girard Lam at [facility] . Awaiting f/u . CM will cont to follow pt's care while hospitalized and update as applicable.    Expected Discharge Plan: Home w Home Health Services    Expected Discharge Plan and Services                                               Social Determinants of Health (SDOH) Interventions SDOH Screenings   Food Insecurity: Patient Unable To Answer (05/10/2023)  Housing: Patient Unable To Answer (05/10/2023)  Transportation Needs: Unknown (05/10/2023)  Utilities: Patient Unable To Answer (05/10/2023)  Alcohol Screen: Low Risk  (12/03/2018)  Depression (PHQ2-9): Low Risk  (12/06/2022)  Financial Resource Strain: Medium Risk (07/26/2018)  Physical Activity: Inactive (07/26/2018)  Social Connections: Moderately Isolated (05/11/2023)  Stress: No Stress Concern Present (07/26/2018)  Tobacco Use: Low Risk  (05/08/2023)    Readmission Risk Interventions     No data to display

## 2023-05-13 NOTE — Progress Notes (Signed)
 OT Cancellation Note  Patient Details Name: ALMEDIA CORDELL MRN: [id number] DOB: 1931/03/17   Cancelled Treatment:    Reason Eval/Treat Not Completed: Other (comment) Orders received, chart reviewed. Awaiting palliative meeting to discuss goals of care. Will hold OT eval until appropriate.     George Kinder, MS, OTR/L , CBIS ascom [phone]  05/13/23, 8:15 AM

## 2023-05-13 NOTE — Consult Note (Signed)
 PHARMACY CONSULT NOTE - ELECTROLYTES  Pharmacy Consult for Electrolyte Monitoring and Replacement   Recent Labs: Weight: 63.5 kg (139 lb 15.9 oz) Estimated Creatinine Clearance: 42.7 mL/min (by C-G formula based on SCr of 0.53 mg/dL). Potassium (mmol/L)  Date Value  05/13/2023 3.9  04/13/2011 3.4 (L)   Magnesium (mg/dL)  Date Value  16/11/9602 2.1   Calcium (mg/dL)  Date Value  54/10/8117 9.1   Calcium, Total (mg/dL)  Date Value  14/78/2956 9.2   Albumin (g/dL)  Date Value  21/30/8657 3.0 (L)  12/07/2022 4.4  04/13/2011 4.0   Phosphorus (mg/dL)  Date Value  84/69/6295 3.4   Sodium (mmol/L)  Date Value  05/13/2023 135  12/07/2022 143  04/13/2011 141    Assessment  Jocelyn [name] is a 89 y.o. female presenting with   wound infection. 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 Pharmacy has been consulted to monitor and replace electrolytes.  Diet: regular diet MIVF: IV lock Pertinent medications:  lasix 40mg  po daily, Irbesartan/amlodipine/Hydrochlorothiazide, spironolactone  Goal of Therapy: Electrolytes WNL K>4.0 and Mag>2.0 w/ Afib  Plan:  K 3.9  will order KCL 20 meq po x 1 Check electrolytes next with AM labs  Thomasine Flick PharmD Clinical Pharmacist 05/13/2023

## 2023-05-13 NOTE — Progress Notes (Signed)
 PT Cancellation Note  Patient Details Name: LADANA CHAVERO MRN: [id number] DOB: [birth year]   Cancelled Treatment:    Reason Eval/Treat Not Completed: Other (comment) Orders received, chart reviewed. Awaiting palliative meeting to discuss goals of care. Will hold PT eval until appropriate.   Janine Melbourne, PT, DPT Physical Therapist - [facility]  [facility]    Emilynn Srinivasan A Aribelle Mccosh 05/13/2023, 7:49 AM

## 2023-05-13 NOTE — Plan of Care (Signed)
   Problem: Safety: Goal: Ability to remain free from injury will improve Outcome: Progressing

## 2023-05-14 DIAGNOSIS — Z931 Gastrostomy status: Secondary | ICD-10-CM | POA: Diagnosis not present

## 2023-05-14 DIAGNOSIS — G4733 Obstructive sleep apnea (adult) (pediatric): Secondary | ICD-10-CM | POA: Diagnosis not present

## 2023-05-14 DIAGNOSIS — E876 Hypokalemia: Secondary | ICD-10-CM

## 2023-05-14 DIAGNOSIS — Z515 Encounter for palliative care: Secondary | ICD-10-CM | POA: Diagnosis not present

## 2023-05-14 DIAGNOSIS — L97423 Non-pressure chronic ulcer of left heel and midfoot with necrosis of muscle: Secondary | ICD-10-CM | POA: Diagnosis not present

## 2023-05-14 DIAGNOSIS — I739 Peripheral vascular disease, unspecified: Secondary | ICD-10-CM | POA: Diagnosis not present

## 2023-05-14 LAB — RENAL FUNCTION PANEL
Albumin: 2.8 g/dL — ABNORMAL LOW (ref 3.5–5.0)
Anion gap: 7 (ref 5–15)
BUN: 10 mg/dL (ref 8–23)
CO2: 23 mmol/L (ref 22–32)
Calcium: 8.9 mg/dL (ref 8.9–10.3)
Chloride: 104 mmol/L (ref 98–111)
Creatinine, Ser: 0.58 mg/dL (ref 0.44–1.00)
GFR, Estimated: >60 mL/min (ref >60)
Glucose, Bld: 100 mg/dL — ABNORMAL HIGH (ref 70–99)
Phosphorus: 3.5 mg/dL (ref 2.5–4.6)
Potassium: 4 mmol/L (ref 3.5–5.1)
Sodium: 134 mmol/L — ABNORMAL LOW (ref 135–145)

## 2023-05-14 LAB — CULTURE, BLOOD (ROUTINE X 2): Culture: NO GROWTH

## 2023-05-14 LAB — MAGNESIUM: Magnesium: 1.8 mg/dL (ref 1.7–2.4)

## 2023-05-14 MED ORDER — LIDOCAINE 5 % EX PTCH: 2.0000 | MEDICATED_PATCH | CUTANEOUS | 0 refills | Status: DC

## 2023-05-14 MED ORDER — POTASSIUM CHLORIDE CRYS ER 20 MEQ PO TBCR
20.0000 meq | EXTENDED_RELEASE_TABLET | Freq: Once | ORAL | Status: AC
  Administered 2023-05-14: 20 meq via ORAL
  Filled 2023-05-14: qty 1

## 2023-05-14 MED ORDER — CADEXOMER IODINE 0.9 % EX GEL: CUTANEOUS | 0 refills | Status: DC

## 2023-05-14 MED ORDER — MAGNESIUM OXIDE -MG SUPPLEMENT 400 (240 MG) MG PO TABS: 400.0000 mg | ORAL_TABLET | Freq: Every day | ORAL | 0 refills | Status: DC

## 2023-05-14 MED ORDER — MAGNESIUM OXIDE -MG SUPPLEMENT 400 (240 MG) MG PO TABS
400.0000 mg | ORAL_TABLET | Freq: Every day | ORAL | Status: DC
  Administered 2023-05-14: 400 mg via ORAL
  Filled 2023-05-14: qty 1

## 2023-05-14 MED ORDER — MAGNESIUM SULFATE 2 GM/50ML IV SOLN
2.0000 g | Freq: Once | INTRAVENOUS | Status: DC
  Filled 2023-05-14: qty 50

## 2023-05-14 MED ORDER — HYDROCODONE-ACETAMINOPHEN 5-325 MG PO TABS: 1.0000 | ORAL_TABLET | Freq: Four times a day (QID) | ORAL | 0 refills | Status: DC | PRN

## 2023-05-14 NOTE — TOC Transition Note (Signed)
 Transition of Care [facility]) - Discharge Note   Patient Details  Name: Jocelyn [name] MRN: [id number] Date of Birth: 1931/12/06  Transition of Care [facility]) CM/SW Contact:  Areta Beer, RN Phone Number: 05/14/2023, 11:13 AM   Clinical Narrative:  4/13: Patient pending discharging to home with hospice care at home via [facility]. EMS transport arranged via [facility] after 1 pm.    Katheryn Pandy MSN RN CM  RN Case Manager [place]  Transitions of Care Direct Dial: [phone] (Weekends Only) [facility] Main Office Phone: [phone] [facility] Fax: [phone] [email or web address]      Final next level of care: Home w Home Health Services     Patient Goals and CMS Choice            Discharge Placement                       Discharge Plan and Services Additional resources added to the After Visit Summary for                                       Social Drivers of Health (SDOH) Interventions SDOH Screenings   Food Insecurity: Patient Unable To Answer (05/10/2023)  Housing: Patient Unable To Answer (05/10/2023)  Transportation Needs: Unknown (05/10/2023)  Utilities: Patient Unable To Answer (05/10/2023)  Alcohol Screen: Low Risk  (12/03/2018)  Depression (PHQ2-9): Low Risk  (12/06/2022)  Financial Resource Strain: Medium Risk (07/26/2018)  Physical Activity: Inactive (07/26/2018)  Social Connections: Moderately Isolated (05/11/2023)  Stress: No Stress Concern Present (07/26/2018)  Tobacco Use: Low Risk  (05/08/2023)     Readmission Risk Interventions     No data to display

## 2023-05-14 NOTE — Progress Notes (Signed)
 [facility] LIAISON NOTE   Received request from Zoe Hinds, RN, Transitions of Care Manager, for hospice services at home after discharge. Spoke with Glenda [place], patient's daughter- to initiate education related to hospice philosophy, services, and team approach to care. Patient/family verbalized understanding of information given  Patient plans to discharge home today via EMS.   DME needs discussed.  Patient has the following equipment in the home:  hoyer lift, hospital bed, [facility], wheelchair  Patient/family requests the following equipment for delivery:  Hospice assessment RN to assess               The address has been verified and is correct in the chart.  [name] is the primary contact to arrange hospice admission visit (AV)   Please send signed and completed DNR home with patient/family if applicable.   Please provide prescriptions at discharge as needed to ensure ongoing symptom management.  [facility] information and contact numbers given to [place].  Above information shared with , Transitions of Care Manager and hospital medical care team.   Please call with any hospice related questions or concerns.  Thank you for the opportunity to participate in this patient's care.   Ambrosio Junker, MA, BSN, RN, FNE Nurse Liaison [phone]

## 2023-05-14 NOTE — Progress Notes (Signed)
 Daily Progress Note   Patient Name: Jocelyn [name]       Date: 05/14/2023 DOB: 1931-05-29  Age: 89 y.o. MRN#: [id number] Attending Physician: No att. providers found Primary Care Physician: Jocelyn Craver, FNP Admit Date: 05/08/2023  Reason for Consultation/Follow-up: Establishing goals of care  HPI/Brief Hospital Review: 89 y.o. 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 Fib, HTN, PAD with chronic left heel ulcer, dementia, OSA on CPAP and CHF admitted from home on 05/08/2023 with worsening appearance of wound with concern for infection.    Followed at home by home health nurse, concern related to draining from PEG as well as worsening appearance to left heel wound, as complaining of abdominal pain   Has been followed by both orthopedics and vascular specialists as outpatient, have recommended amputation which Jocelyn [name] has declined in the past   Vascular surgery consulted this admission-again recommending amputation which again is being declined ID consulted-no need for continuing with antibiotic therapy at this time   Palliative medicine was consulted for assisting with goals of care conversations.  Subjective: Extensive chart review has been completed prior to meeting patient including labs, vital signs, imaging, progress notes, orders, and available advanced directive documents from current and previous encounters.    Per chart review of last TOC note, family opted for [facility] hospice agency.  Called and spoke with hospice liaison, Jocelyn Lam, RN.  Confirmed receipt of referral and provided brief report.  Visited with Jocelyn [name] at her bedside, she is  awake, alert, oriented to self only.  Daughter-Jocelyn [name] at bedside during time of visit.  Informed her of anticipated call from hospice liaison this morning.  [name] denies acute pain or discomfort or any other acute symptoms.  Answered and addressed all questions and concerns.  Anticipate discharge home today with hospice service following.  Thank you for allowing the Palliative Medicine Team to assist in the care of this patient.  Total time: 25 minutes  Time spent includes: Detailed review of medical records (labs, imaging, vital signs), medically appropriate exam (mental status, respiratory, cardiac, skin), discussed with treatment team, counseling and educating patient, family and staff, documenting clinical information, medication management and coordination of care.  Jocelyn Marble, DNP, AGNP-C  Palliative Medicine   Please contact Palliative Medicine Team phone at [phone] for questions and concerns.

## 2023-05-14 NOTE — Evaluation (Signed)
 Physical Therapy Evaluation Patient Details Name: Jocelyn [name] MRN: [id number] DOB: [birth year] Today's Date: 05/14/2023  History of Present Illness  Pt is an 89 y/o F admitted on 05/08/23 after presenting with concerns of wound infection. Home health nurse concerned for PEG tube surrounding infection 2/2 increased purulence & L heel ulcer has increasing discharge. Pt is being treated nonoperatively for L heel ulcer, no evidence of infection for PEG tube. 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 Pt is AxOx3, limited by c/o significant LLE knee pain (daughter reports pt has chronic arthritic pain). Pt unable to fully extend L knee, PT attempted to position pillows under LLE for increased knee comfort & to float L heel. Pt requires max assist to come to sitting EOB, demonstrates L lateral lean in sitting. Pt tolerates sitting <2 minutes before requesting to lie back down. Will continue to follow pt acutely to progress mobility to decrease caregiver burden.        If plan is discharge home, recommend the following: Two people to help with walking and/or transfers;Two people to help with bathing/dressing/bathroom   Can travel by private vehicle        Equipment Recommendations None recommended by PT  Recommendations for Other Services       Functional Status Assessment Patient has had a recent decline in their functional status and demonstrates the ability to make significant improvements in function in a reasonable and predictable amount of time.     Precautions / Restrictions Precautions Precautions: Fall Restrictions Weight Bearing Restrictions Per Provider Order: No      Mobility  Bed  Mobility Overal bed mobility: Needs Assistance Bed Mobility: Supine to Sit, Sit to Supine     Supine to sit: Max assist Sit to supine: Max assist   General bed mobility comments: +2 for scooting to [facility]    Transfers                        Ambulation/Gait                  Stairs            Wheelchair Mobility     Tilt Bed    Modified Rankin (Stroke Patients Only)       Balance Overall balance assessment: Needs assistance Sitting-balance support: Feet unsupported, No upper extremity supported, Bilateral upper extremity supported Sitting balance-Leahy Scale: Poor   Postural control: Left lateral lean                                   Pertinent Vitals/Pain Pain Assessment Pain Assessment: Faces Faces Pain Scale: Hurts even more Pain Location: L knee Pain Descriptors / Indicators: Discomfort, Grimacing Pain Intervention(s): Repositioned, Monitored during session    Home Living Family/patient expects to be discharged to:: Private residence   Available Help at Discharge: Family;Available 24 hours/day             Home Equipment: Hospital bed;Other (comment) (hoyer lift)      Prior Function               Mobility Comments: Pt was at home  with family providing care, uses hoyer lift when transferring OOB but has been working on sitting EOB/standing with HHPT.       Extremity/Trunk Assessment   Upper Extremity Assessment Upper Extremity Assessment: Generalized weakness    Lower Extremity Assessment Lower Extremity Assessment: Generalized weakness (BLE arthritic knees, unable to fully extend BLE knees)       Communication   Communication Communication: Impaired Factors Affecting Communication: Hearing impaired    Cognition Arousal: Alert Behavior During Therapy: WFL for tasks assessed/performed   PT - Cognitive impairments: History of cognitive impairments                       PT - Cognition  Comments: hx of dementia but oriented to self, place, time (unsure if pt was reading dry erase board or not) Following commands: Impaired Following commands impaired: Follows one step commands with increased time, Follows one step commands inconsistently     Cueing Cueing Techniques: Verbal cues, Tactile cues, Visual cues, Gestural cues     General Comments      Exercises     Assessment/Plan    PT Assessment Patient needs continued PT services  PT Problem List Decreased strength;Decreased coordination;Pain;Decreased range of motion;Decreased cognition;Decreased activity tolerance;Decreased balance;Decreased mobility;Decreased skin integrity;Decreased knowledge of use of DME;Decreased safety awareness       PT Treatment Interventions DME instruction;Gait training;Neuromuscular re-education;Balance training;Stair training;Functional mobility training;Therapeutic exercise;Manual techniques;Therapeutic activities;Patient/family education;Modalities    PT Goals (Current goals can be found in the Care Plan section)  Acute Rehab PT Goals Patient Stated Goal: unsure PT Goal Formulation: With patient/family Time For Goal Achievement: 05/28/23 Potential to Achieve Goals: Poor    Frequency Min 1X/week     Co-evaluation               AM-PAC PT "6 Clicks" Mobility  Outcome Measure Help needed turning from your back to your side while in a flat bed without using bedrails?: A Lot Help needed moving from lying on your back to sitting on the side of a flat bed without using bedrails?: Total Help needed moving to and from a bed to a chair (including a wheelchair)?: Total Help needed standing up from a chair using your arms (e.g., wheelchair or bedside chair)?: Total Help needed to walk in hospital room?: Total Help needed climbing 3-5 steps with a railing? : Total 6 Click Score: 7    End of Session   Activity Tolerance: Patient limited by pain;Patient limited by fatigue Patient  left: in bed;with call bell/phone within reach;with bed alarm set;with nursing/sitter in room;with family/visitor present Nurse Communication: Mobility status PT Visit Diagnosis: Muscle weakness (generalized) (M62.81);Other abnormalities of gait and mobility (R26.89);Pain Pain - Right/Left: Left Pain - part of body: Knee    Time: 0921-0941 PT Time Calculation (min) (ACUTE ONLY): 20 min   Charges:   PT Evaluation $PT Eval High Complexity: 1 High   PT General Charges $$ ACUTE PT VISIT: 1 Visit         Emaline Handsome, PT, DPT 05/14/23, 10:01 AM   Venetta Gill 05/14/2023, 9:59 AM

## 2023-05-14 NOTE — Plan of Care (Signed)

## 2023-05-14 NOTE — Consult Note (Signed)
 PHARMACY CONSULT NOTE - ELECTROLYTES  Pharmacy Consult for Electrolyte Monitoring and Replacement   Recent Labs: Weight: 63.5 kg (139 lb 15.9 oz) Estimated Creatinine Clearance: 42.7 mL/min (by C-G formula based on SCr of 0.58 mg/dL). Potassium (mmol/L)  Date Value  05/14/2023 4.0  04/13/2011 3.4 (L)   Magnesium (mg/dL)  Date Value  78/46/9629 1.8   Calcium (mg/dL)  Date Value  52/84/1324 8.9   Calcium, Total (mg/dL)  Date Value  40/11/2723 9.2   Albumin (g/dL)  Date Value  36/64/4034 2.8 (L)  12/07/2022 4.4  04/13/2011 4.0   Phosphorus (mg/dL)  Date Value  74/25/9563 3.5   Sodium (mmol/L)  Date Value  05/14/2023 134 (L)  12/07/2022 143  04/13/2011 141    Assessment  EARLY ORD is a 89 y.o. female presenting with   wound infection. 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 Pharmacy has been consulted to monitor and replace electrolytes.  Diet: regular diet MIVF: IV lock Pertinent medications:  lasix 40mg  po daily, Irbesartan/amlodipine/Hydrochlorothiazide, spironolactone  Goal of Therapy: Electrolytes WNL K>4.0 and Mag>2.0 w/ Afib  Plan:  K 4.0  will order KCL 20 meq po x 1 (as pt on lasix 40mg  po daily, to maintain K >/= 4.0 in Afib) Mag 1.8  will order Magnesium sulfate 2 gm IV x 1  Check electrolytes next with AM labs  Thomasine Flick PharmD Clinical Pharmacist 05/14/2023

## 2023-05-14 NOTE — Discharge Instructions (Addendum)
 PCP to check BMP  Keep log of BP meds at home and have PCP review to adjust meds accordingly Heel ulcer dressing as per instructions

## 2023-05-14 NOTE — Progress Notes (Signed)
 Discharge instruction was given to Daughter,she acknowledge understanding, and states she will comply. Teach back instruction was given on wound care of the heel and encourage the family to keep her prevalon boots on all day and remove at bedtime. Patient is waiting for transport by [facility] at 1 pm, to take home.

## 2023-05-14 NOTE — Discharge Summary (Signed)
 Physician Discharge Summary   Patient: Jocelyn [name] MRN: [id number] DOB: 1931/09/15  Admit date:     05/08/2023  Discharge date: 05/14/23  Discharge Physician: Melvinia Stager   PCP: Jocelyn Craver, FNP   Recommendations at discharge:    PCP to check BMP on next follow up 05/26/2023 Keep log of BP meds at home and have PCP review to adjust meds accordingly Heel ulcer dressing as per instructions  Discharge Diagnoses: Principal Problem:   Heel ulcer (HCC) Active Problems:   Electrolyte abnormality   S/P percutaneous endoscopic gastrostomy (PEG) tube placement (HCC)   Essential hypertension   OSA on CPAP   Chronic a-fib (HCC)   PAD (peripheral artery disease) (HCC)   Dementia without behavioral disturbance (HCC)   Chronic diastolic CHF (congestive heart failure) (HCC)   Wound infection  Jocelyn [name] is 89 y.o. 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  Per patient's daughter the patient has a wound care nurse at home, who had concern last week that PEG tube had increasing purulence and surrounding infection.  On 4/7 wound care RN noted increased discharge from heel ulcer and called EMS.    Chronic left Heel ulcer - Chronic heel ulcer for which patient has been following with orthopedic surgery and vascular surgery.  Both specialists have recommended amputation or hospice.  - Podiatry consulted again on this admission and agrees with recommendations for amputation, abx are probably futile given poor vascularity the area, abx /wound care is palliative and not curative in nature, however, pt and family currently declined amputation. - started  on on broad-spectrum antibiotics, which were d/c'ed, since no need, per ID. --wound care RN re-consulted, and after discussion with ID, decided on using Iodosorb for wound care, see wound care order.   Hypokalemia Hypomag --repleted and supplement PRN   Dysphagia 2/2 stroke Status post PEG tube placement - family reports she is no longer using PEG since Feb, and they have been seeking removal outpatient--defer to PCP - PEG site appears noninfected.  Some scant drainage noted.  Tube flushes ok. --will leave tube in since it's functional, per daughters request  Chronic diastolic heart failure - Last echo November 2024 with severe concentric LVH.  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  ABIs demonstrated adequate blood flow as of February 2025 - No evidence of acute ischemia on exam --cont statin   Chronic A-fib - History of A-fib previously on Eliquis, discontinued given SAH   OSA on CPAP - Continue CPAP at bedtime   Hypertension --cont lasix, irbesartan, amlodipine, hydrochlorothiazide, Imdur and spironolactone --d/w dter Jocelyn [name] to keep log of BP at home and review with PCP to adjust meds if needed.   Palliative care input appreciated. Pt's family is agreeable with Hospice to follow at home.  DVT prophylaxis: Heparin SQ Code Status: DNR  Family Communication: Dter Jocelyn [name] on the phone Level of care: Med-Surg Dispo:   The patient is  from: home with HOSPICE to follow    Pain control - [place]  Controlled Substance Reporting System database was reviewed. and patient was instructed, not to drive, operate heavy  machinery, perform activities at heights, swimming or participation in water activities or provide baby-sitting services while on Pain, Sleep and Anxiety Medications; until their outpatient Physician has advised to do so again. Also recommended to not to take more than prescribed Pain, Sleep and Anxiety Medications.  Disposition: Hospice care Diet recommendation:  Discharge Diet Orders (From admission, onward)     Start     Ordered   05/14/23 0000  Diet - low sodium heart healthy        05/14/23 1012           Regular diet DISCHARGE MEDICATION: Allergies as of 05/14/2023   No Known Allergies      Medication List     STOP taking these medications    amLODipine 10 MG tablet Commonly known as: NORVASC   feeding supplement (OSMOLITE 1.2 CAL) Liqd   ipratropium-albuterol 0.5-2.5 (3) MG/3ML Soln Commonly known as: DUONEB   mouth rinse Liqd solution       TAKE these medications    acetaminophen 325 MG tablet Commonly known as: TYLENOL Take 2 tablets (650 mg total) by mouth every 4 (four) hours as needed for mild pain (pain score 1-3) (or temp > 37.5 C (99.5 F)).   ascorbic acid 250 MG tablet Commonly known as: VITAMIN C Place 1 tablet (250 mg total) into feeding tube daily. What changed: how to take this   atorvastatin 40 MG tablet Commonly known as: LIPITOR Place 1 tablet (40 mg total) into feeding tube daily. What changed: how to take this   cadexomer iodine 0.9 % gel Commonly known as: IODOSORB Apply topically every other day.   calcium citrate-vitamin D 500-400 MG-UNIT chewable tablet Place 1 tablet into feeding tube 2 (two) times daily.   famotidine 20 MG tablet Commonly known as: PEPCID Place 1 tablet (20 mg total) into feeding tube daily. What changed: how to take this   ferrous sulfate 300 (60 Fe) MG/5ML syrup Place 325 mg into feeding tube daily.   fiber supplement (BANATROL TF) liquid Place 60 mLs into feeding tube 2 (two) times daily.   free  water Soln Place 100 mLs into feeding tube every 4 (four) hours.   furosemide 40 MG tablet Commonly known as: LASIX Take 40 mg by mouth daily.   HYDROcodone-acetaminophen 5-325 MG tablet Commonly known as: NORCO/VICODIN Take 1 tablet by mouth every 6 (six) hours as needed for moderate pain (pain score 4-6).   isosorbide mononitrate 120 MG 24 hr tablet Commonly known as: IMDUR Take 120 mg by mouth daily.   lidocaine 5 % Commonly known as: LIDODERM Place 2 patches onto the skin daily. Remove & Discard patch within 12 hours or as directed by MD   liver oil-zinc oxide 40 % ointment Commonly known as: DESITIN Apply topically 3 (three) times daily. Apply to perianal region, buttock, groin after each stool   magnesium oxide 400 (240 Mg) MG tablet Commonly known as: MAG-OX Take 1 tablet (400 mg total) by mouth daily. Start taking on: May 15, 2023   multivitamin Liqd Place 15 mLs into feeding tube daily. What changed: how to take this   Olmesartan-amLODIPine-HCTZ 40-10-12.5 MG Tabs Take 1 tablet by mouth daily.   spironolactone 25 MG tablet Commonly known as: ALDACTONE Take 12.5 mg by mouth daily.   white petrolatum Oint  Commonly known as: VASELINE Apply 1 Application topically as needed for lip care.               Discharge Care Instructions  (From admission, onward)           Start     Ordered   05/14/23 0000  Discharge wound care:       Comments: Pressure Injury 01/21/23 Sacrum Stage 2 -  Partial thickness loss of dermis presenting as a shallow open injury with a red, pink wound bed without slough. red open sore 113 days    Pressure Injury 01/21/23 Heel Left;Posterior Deep Tissue Pressure Injury - Purple or maroon localized area of discolored intact skin or blood-filled blister due to damage of underlying soft tissue from pressure and/or shear. purplish area 112 days      Wound Care Orders (From admission, onward)      Start     Ordered   05/12/23 0945     Wound care  Every other day    Comments: Cleanse L heel with soap and water, dry and apply Iodosorb gel to heel every other day, cover with dry gauze and Kerlix roll gauze. Place in [place] boot to offload pressure.  05/12/23 0945   05/14/23 1012            Follow-up Information     Jocelyn Craver, FNP. Schedule an appointment as soon as possible for a visit in 1 week(s).   Specialties: Nurse Practitioner, Family Medicine Contact information: [address] [name] [address] [phone]                Discharge Exam: Jocelyn [name] Weights   05/09/23 0054  Weight: 63.5 kg   GENERAL:  89 y.o.-year-old patient with no acute distress.  LUNGS: Normal breath sounds bilaterally CARDIOVASCULAR: S1, S2 normal.  ABDOMEN: Soft, nontender, nondistended.  EXTREMITIES:  NEUROLOGIC: nonfocal  patient is alert and awake   Condition at discharge: fair  The results of significant diagnostics from this hospitalization (including imaging, microbiology, ancillary and laboratory) are listed below for reference.   Imaging Studies: DG Foot 2 Views Left Result Date: 05/08/2023 CLINICAL DATA:  Heel wound. EXAM: LEFT FOOT - 2 VIEW COMPARISON:  None Available. FINDINGS: Osseous structures are osteopenic. No acute fracture, dislocation or subluxation. Soft tissue defect posterior to the calcaneus consistent with an open wound. Posterior and plantar calcaneal spurs. No bony destructive processes. IMPRESSION: Osteopenia. Soft tissue defect. No acute osseous abnormalities. Electronically Signed   By: Sydell Eva M.D.   On: 05/08/2023 19:53   DG Chest 2 View Result Date: 05/08/2023 CLINICAL DATA:  Weakness EXAM: CHEST - 2 VIEW COMPARISON:  X-ray 01/25/2023 and older FINDINGS: Enlarged cardiopericardial silhouette. Calcified aorta. No consolidation, pneumothorax or effusion. Mild left lung base scar or atelectasis. Calcifications along the mitral valve annulus. Degenerative changes seen along the  spine. IMPRESSION: Enlarged cardiac silhouette. Calcified tortuous aorta. Left basilar scar or atelectasis. Curvature and degenerative changes of the spine. Electronically Signed   By: Adrianna Horde M.D.   On: 05/08/2023 16:50    Microbiology: Results for orders placed or performed during the hospital encounter of 05/08/23  Blood culture (routine x 2)     Status: None   Collection Time: 05/08/23  5:01 PM   Specimen: BLOOD RIGHT ARM  Result Value Ref Range Status   Specimen Description BLOOD RIGHT ARM  Final   Special Requests   Final    BOTTLES DRAWN AEROBIC AND ANAEROBIC Blood Culture  results may not be optimal due to an inadequate volume of blood received in culture bottles   Culture   Final    NO GROWTH 5 DAYS Performed at [facility], [address] [place], [name] [address]    Report Status 05/13/2023 FINAL  Final  Blood culture (routine x 2)     Status: None   Collection Time: 05/09/23 10:18 AM   Specimen: BLOOD RIGHT HAND  Result Value Ref Range Status   Specimen Description BLOOD RIGHT HAND  Final   Special Requests   Final    BOTTLES DRAWN AEROBIC AND ANAEROBIC Blood Culture results may not be optimal due to an inadequate volume of blood received in culture bottles   Culture   Final    NO GROWTH 5 DAYS Performed at [facility], [address] [place], [name] [address]    Report Status 05/14/2023 FINAL  Final    Labs: CBC: Recent Labs  Lab 05/08/23 1454 05/09/23 0624  WBC 6.3 6.3  NEUTROABS 2.8  --   HGB 12.2 11.6*  HCT 38.1 37.1  MCV 77.1* 78.3*  PLT 234 215   Basic Metabolic Panel: Recent Labs  Lab 05/08/23 1454 05/09/23 0624 05/10/23 0443 05/11/23 0441 05/12/23 0456 05/13/23 0533 05/14/23 0407  NA 139 137 137  --   --  135 134*  K 2.6* 3.0* 3.4* 3.7 4.1 3.9 4.0  CL 103 103 104  --   --  105 104  CO2 28 27 26   --   --  25 23  GLUCOSE 109* 105* 103*  --   --  93 100*  BUN 10 7* 8  --   --  9 10  CREATININE 0.58 0.51 0.49   --  0.49 0.53 0.58  CALCIUM 9.4 9.0 8.9  --   --  9.1 8.9  MG 1.6* 1.9 1.8  --  1.7 2.1 1.8  PHOS  --  3.4 3.4  --   --   --  3.5   Liver Function Tests: Recent Labs  Lab 05/08/23 1454 05/10/23 0443 05/14/23 0407  AST 18  --   --   ALT 11  --   --   ALKPHOS 52  --   --   BILITOT 0.8  --   --   PROT 8.1  --   --   ALBUMIN 3.4* 3.0* 2.8*   Discharge time spent: greater than 30 minutes.  Signed: Melvinia Stager, MD [facility] 05/14/2023

## 2023-06-12 ENCOUNTER — Ambulatory Visit: Admitting: Physician Assistant

## 2023-07-07 ENCOUNTER — Encounter: Payer: Self-pay | Admitting: Internal Medicine

## 2023-09-01 DEATH — deceased

## 2023-09-22 ENCOUNTER — Ambulatory Visit: Payer: 59

## 2023-10-06 ENCOUNTER — Ambulatory Visit
